# Patient Record
Sex: Male | Born: 1950
Health system: Southern US, Community
[De-identification: ages and names within clinical notes are randomized; demographics above are authoritative.]

## PROBLEM LIST (undated history)

## (undated) DIAGNOSIS — H57059 Tonic pupil, unspecified eye: Secondary | ICD-10-CM

## (undated) DIAGNOSIS — I10 Essential (primary) hypertension: Secondary | ICD-10-CM

## (undated) DIAGNOSIS — G4730 Sleep apnea, unspecified: Secondary | ICD-10-CM

## (undated) DIAGNOSIS — I6521 Occlusion and stenosis of right carotid artery: Secondary | ICD-10-CM

## (undated) DIAGNOSIS — E039 Hypothyroidism, unspecified: Secondary | ICD-10-CM

## (undated) DIAGNOSIS — I639 Cerebral infarction, unspecified: Secondary | ICD-10-CM

## (undated) DIAGNOSIS — E7800 Pure hypercholesterolemia, unspecified: Secondary | ICD-10-CM

## (undated) DIAGNOSIS — R7303 Prediabetes: Secondary | ICD-10-CM

## (undated) DIAGNOSIS — I2510 Atherosclerotic heart disease of native coronary artery without angina pectoris: Secondary | ICD-10-CM

## (undated) DIAGNOSIS — Z951 Presence of aortocoronary bypass graft: Secondary | ICD-10-CM

## (undated) DIAGNOSIS — E079 Disorder of thyroid, unspecified: Secondary | ICD-10-CM

## (undated) DIAGNOSIS — Z9221 Personal history of antineoplastic chemotherapy: Secondary | ICD-10-CM

## (undated) DIAGNOSIS — C8190 Hodgkin lymphoma, unspecified, unspecified site: Secondary | ICD-10-CM

## (undated) DIAGNOSIS — R0602 Shortness of breath: Secondary | ICD-10-CM

## (undated) DIAGNOSIS — I050 Rheumatic mitral stenosis: Secondary | ICD-10-CM

## (undated) DIAGNOSIS — D649 Anemia, unspecified: Secondary | ICD-10-CM

## (undated) DIAGNOSIS — I6522 Occlusion and stenosis of left carotid artery: Secondary | ICD-10-CM

## (undated) DIAGNOSIS — G2581 Restless legs syndrome: Secondary | ICD-10-CM

## (undated) DIAGNOSIS — F329 Major depressive disorder, single episode, unspecified: Secondary | ICD-10-CM

## (undated) DIAGNOSIS — C50919 Malignant neoplasm of unspecified site of unspecified female breast: Principal | ICD-10-CM

## (undated) HISTORY — DX: Pure hypercholesterolemia, unspecified: E78.00

## (undated) HISTORY — DX: Cerebral infarction, unspecified: I63.9

## (undated) HISTORY — DX: Tonic pupil, unspecified eye: H57.059

## (undated) HISTORY — DX: Rheumatic mitral stenosis: I05.0

## (undated) HISTORY — DX: Essential (primary) hypertension: I10

## (undated) HISTORY — DX: Malignant neoplasm of unspecified site of unspecified female breast: C50.919

## (undated) HISTORY — DX: Occlusion and stenosis of right carotid artery: I65.21

## (undated) HISTORY — DX: Occlusion and stenosis of left carotid artery: I65.22

## (undated) HISTORY — PX: TRANSTHORACIC ECHOCARDIOGRAM: SHX275

## (undated) HISTORY — PX: MASTECTOMY: SHX3

## (undated) HISTORY — DX: Disorder of thyroid, unspecified: E07.9

## (undated) HISTORY — PX: BREAST BIOPSY: SHX20

## (undated) HISTORY — DX: Anemia, unspecified: D64.9

---

## 1972-10-09 HISTORY — PX: TONSILLECTOMY: SUR1361

## 1981-10-09 DIAGNOSIS — F32A Depression, unspecified: Secondary | ICD-10-CM

## 1981-10-09 DIAGNOSIS — C8190 Hodgkin lymphoma, unspecified, unspecified site: Secondary | ICD-10-CM

## 1981-10-09 HISTORY — DX: Depression, unspecified: F32.A

## 1981-10-09 HISTORY — DX: Hodgkin lymphoma, unspecified, unspecified site: C81.90

## 1981-10-09 HISTORY — PX: LYMPH NODE BIOPSY: SHX201

## 2002-03-20 ENCOUNTER — Ambulatory Visit (HOSPITAL_COMMUNITY): Admission: RE | Admit: 2002-03-20 | Discharge: 2002-03-20 | Payer: Self-pay | Admitting: Neurology

## 2002-03-20 ENCOUNTER — Encounter: Payer: Self-pay | Admitting: Neurology

## 2003-03-16 ENCOUNTER — Ambulatory Visit (HOSPITAL_COMMUNITY): Admission: RE | Admit: 2003-03-16 | Discharge: 2003-03-16 | Payer: Self-pay | Admitting: Family Medicine

## 2003-03-16 ENCOUNTER — Encounter: Payer: Self-pay | Admitting: Family Medicine

## 2006-02-17 ENCOUNTER — Emergency Department (HOSPITAL_COMMUNITY): Admission: EM | Admit: 2006-02-17 | Discharge: 2006-02-17 | Payer: Self-pay | Admitting: Emergency Medicine

## 2006-02-20 ENCOUNTER — Ambulatory Visit (HOSPITAL_COMMUNITY): Admission: RE | Admit: 2006-02-20 | Discharge: 2006-02-20 | Payer: Self-pay | Admitting: Internal Medicine

## 2006-10-10 ENCOUNTER — Ambulatory Visit: Payer: Self-pay | Admitting: Cardiology

## 2007-07-23 ENCOUNTER — Ambulatory Visit (HOSPITAL_COMMUNITY): Admission: RE | Admit: 2007-07-23 | Discharge: 2007-07-23 | Payer: Self-pay | Admitting: Family Medicine

## 2008-08-12 ENCOUNTER — Ambulatory Visit (HOSPITAL_COMMUNITY): Admission: RE | Admit: 2008-08-12 | Discharge: 2008-08-12 | Payer: Self-pay | Admitting: Family Medicine

## 2010-10-09 HISTORY — PX: BREAST BIOPSY: SHX20

## 2010-10-09 HISTORY — PX: MASTECTOMY: SHX3

## 2010-12-08 ENCOUNTER — Other Ambulatory Visit (HOSPITAL_COMMUNITY): Payer: Self-pay | Admitting: Family Medicine

## 2010-12-08 DIAGNOSIS — N6310 Unspecified lump in the right breast, unspecified quadrant: Secondary | ICD-10-CM

## 2010-12-14 ENCOUNTER — Other Ambulatory Visit (HOSPITAL_COMMUNITY): Payer: Self-pay | Admitting: Family Medicine

## 2010-12-14 ENCOUNTER — Other Ambulatory Visit: Payer: Self-pay | Admitting: Diagnostic Radiology

## 2010-12-14 ENCOUNTER — Ambulatory Visit (HOSPITAL_COMMUNITY)
Admission: RE | Admit: 2010-12-14 | Discharge: 2010-12-14 | Disposition: A | Discharge status: Home / Self Care | Payer: BC Managed Care – PPO | Source: Ambulatory Visit | Attending: Family Medicine | Admitting: Family Medicine

## 2010-12-14 ENCOUNTER — Other Ambulatory Visit: Payer: Self-pay | Admitting: *Deleted

## 2010-12-14 DIAGNOSIS — N6310 Unspecified lump in the right breast, unspecified quadrant: Secondary | ICD-10-CM

## 2010-12-14 DIAGNOSIS — C50929 Malignant neoplasm of unspecified site of unspecified male breast: Secondary | ICD-10-CM | POA: Insufficient documentation

## 2010-12-16 ENCOUNTER — Other Ambulatory Visit (HOSPITAL_COMMUNITY): Payer: Self-pay | Admitting: Internal Medicine

## 2010-12-16 DIAGNOSIS — C50919 Malignant neoplasm of unspecified site of unspecified female breast: Secondary | ICD-10-CM

## 2010-12-21 ENCOUNTER — Ambulatory Visit (HOSPITAL_COMMUNITY)
Admission: RE | Admit: 2010-12-21 | Discharge: 2010-12-21 | Disposition: A | Discharge status: Home / Self Care | Payer: BC Managed Care – PPO | Source: Ambulatory Visit | Attending: Internal Medicine | Admitting: Internal Medicine

## 2010-12-21 DIAGNOSIS — C50929 Malignant neoplasm of unspecified site of unspecified male breast: Secondary | ICD-10-CM | POA: Insufficient documentation

## 2010-12-22 ENCOUNTER — Ambulatory Visit (HOSPITAL_COMMUNITY): Admission: RE | Admit: 2010-12-22 | Payer: BC Managed Care – PPO | Source: Ambulatory Visit

## 2010-12-28 ENCOUNTER — Ambulatory Visit (HOSPITAL_COMMUNITY)
Admission: RE | Admit: 2010-12-28 | Discharge: 2010-12-28 | Disposition: A | Discharge status: Home / Self Care | Payer: BC Managed Care – PPO | Source: Ambulatory Visit | Attending: Internal Medicine | Admitting: Internal Medicine

## 2010-12-28 ENCOUNTER — Other Ambulatory Visit (HOSPITAL_COMMUNITY): Payer: Self-pay | Admitting: General Surgery

## 2010-12-28 DIAGNOSIS — C50919 Malignant neoplasm of unspecified site of unspecified female breast: Secondary | ICD-10-CM

## 2010-12-29 ENCOUNTER — Other Ambulatory Visit (HOSPITAL_COMMUNITY): Payer: Self-pay | Admitting: General Surgery

## 2010-12-29 DIAGNOSIS — D0510 Intraductal carcinoma in situ of unspecified breast: Secondary | ICD-10-CM

## 2011-01-02 ENCOUNTER — Encounter (HOSPITAL_COMMUNITY)
Admission: RE | Admit: 2011-01-02 | Discharge: 2011-01-02 | Disposition: A | Discharge status: Home / Self Care | Payer: BC Managed Care – PPO | Source: Ambulatory Visit | Attending: General Surgery | Admitting: General Surgery

## 2011-01-02 ENCOUNTER — Encounter (HOSPITAL_COMMUNITY): Payer: Self-pay

## 2011-01-02 ENCOUNTER — Other Ambulatory Visit (HOSPITAL_COMMUNITY): Payer: BC Managed Care – PPO

## 2011-01-02 ENCOUNTER — Ambulatory Visit (HOSPITAL_COMMUNITY)
Admission: RE | Admit: 2011-01-02 | Discharge: 2011-01-02 | Disposition: A | Discharge status: Home / Self Care | Payer: BC Managed Care – PPO | Source: Ambulatory Visit | Attending: General Surgery | Admitting: General Surgery

## 2011-01-02 DIAGNOSIS — J984 Other disorders of lung: Secondary | ICD-10-CM | POA: Insufficient documentation

## 2011-01-02 DIAGNOSIS — C50929 Malignant neoplasm of unspecified site of unspecified male breast: Secondary | ICD-10-CM | POA: Insufficient documentation

## 2011-01-02 DIAGNOSIS — C50919 Malignant neoplasm of unspecified site of unspecified female breast: Secondary | ICD-10-CM

## 2011-01-02 DIAGNOSIS — D0510 Intraductal carcinoma in situ of unspecified breast: Secondary | ICD-10-CM

## 2011-01-02 DIAGNOSIS — R937 Abnormal findings on diagnostic imaging of other parts of musculoskeletal system: Secondary | ICD-10-CM | POA: Insufficient documentation

## 2011-01-02 MED ORDER — IOHEXOL 300 MG/ML  SOLN
100.0000 mL | Freq: Once | INTRAMUSCULAR | Status: AC | PRN
  Administered 2011-01-02: 100 mL via INTRAVENOUS

## 2011-01-02 MED ORDER — TECHNETIUM TC 99M MEDRONATE IV KIT
25.0000 | PACK | Freq: Once | INTRAVENOUS | Status: AC | PRN
Start: 2011-01-02 — End: 2011-01-02
  Administered 2011-01-02: 26.6 via INTRAVENOUS

## 2011-01-09 ENCOUNTER — Other Ambulatory Visit (HOSPITAL_COMMUNITY): Payer: Self-pay | Admitting: Oncology

## 2011-01-09 ENCOUNTER — Encounter (HOSPITAL_COMMUNITY): Payer: BC Managed Care – PPO

## 2011-01-09 ENCOUNTER — Encounter (HOSPITAL_COMMUNITY): Payer: BC Managed Care – PPO | Attending: Internal Medicine | Admitting: Oncology

## 2011-01-09 DIAGNOSIS — C50929 Malignant neoplasm of unspecified site of unspecified male breast: Secondary | ICD-10-CM | POA: Insufficient documentation

## 2011-01-09 DIAGNOSIS — Z79899 Other long term (current) drug therapy: Secondary | ICD-10-CM | POA: Insufficient documentation

## 2011-01-09 DIAGNOSIS — C50919 Malignant neoplasm of unspecified site of unspecified female breast: Secondary | ICD-10-CM

## 2011-01-09 LAB — COMPREHENSIVE METABOLIC PANEL
Alkaline Phosphatase: 63 U/L (ref 39–117)
BUN: 14 mg/dL (ref 6–23)
CO2: 26 mEq/L (ref 19–32)
GFR calc non Af Amer: 59 mL/min — ABNORMAL LOW (ref >60)
Glucose, Bld: 61 mg/dL — ABNORMAL LOW (ref 70–99)
Potassium: 3.6 mEq/L (ref 3.5–5.1)
Total Bilirubin: 0.5 mg/dL (ref 0.3–1.2)
Total Protein: 7.1 g/dL (ref 6.0–8.3)

## 2011-01-09 LAB — SEDIMENTATION RATE: Sed Rate: 1 mm/hr (ref 0–16)

## 2011-01-09 LAB — CBC
HCT: 46.8 % (ref 39.0–52.0)
MCV: 92.9 fL (ref 78.0–100.0)
RDW: 13.1 % (ref 11.5–15.5)
WBC: 13.4 10*3/uL — ABNORMAL HIGH (ref 4.0–10.5)

## 2011-01-09 LAB — DIFFERENTIAL
Eosinophils Relative: 2 % (ref 0–5)
Lymphocytes Relative: 24 % (ref 12–46)
Lymphs Abs: 3.2 10*3/uL (ref 0.7–4.0)
Monocytes Absolute: 1.3 10*3/uL — ABNORMAL HIGH (ref 0.1–1.0)

## 2011-01-09 LAB — LACTATE DEHYDROGENASE: LDH: 166 U/L (ref 94–250)

## 2011-01-10 ENCOUNTER — Other Ambulatory Visit (HOSPITAL_COMMUNITY): Payer: Self-pay | Admitting: General Surgery

## 2011-01-10 ENCOUNTER — Other Ambulatory Visit (INDEPENDENT_AMBULATORY_CARE_PROVIDER_SITE_OTHER): Payer: Self-pay | Admitting: Internal Medicine

## 2011-01-10 DIAGNOSIS — C50911 Malignant neoplasm of unspecified site of right female breast: Secondary | ICD-10-CM

## 2011-01-12 ENCOUNTER — Other Ambulatory Visit (HOSPITAL_COMMUNITY): Payer: BC Managed Care – PPO

## 2011-01-17 ENCOUNTER — Encounter (HOSPITAL_COMMUNITY): Payer: BC Managed Care – PPO

## 2011-01-17 ENCOUNTER — Ambulatory Visit (HOSPITAL_COMMUNITY)
Admission: RE | Admit: 2011-01-17 | Discharge: 2011-01-18 | Disposition: A | Discharge status: Home / Self Care | Payer: BC Managed Care – PPO | Source: Ambulatory Visit | Attending: General Surgery | Admitting: General Surgery

## 2011-01-17 ENCOUNTER — Encounter (HOSPITAL_COMMUNITY): Payer: Self-pay

## 2011-01-17 ENCOUNTER — Other Ambulatory Visit: Payer: Self-pay | Admitting: General Surgery

## 2011-01-17 DIAGNOSIS — Z923 Personal history of irradiation: Secondary | ICD-10-CM | POA: Insufficient documentation

## 2011-01-17 DIAGNOSIS — C50911 Malignant neoplasm of unspecified site of right female breast: Secondary | ICD-10-CM

## 2011-01-17 DIAGNOSIS — C50929 Malignant neoplasm of unspecified site of unspecified male breast: Secondary | ICD-10-CM | POA: Insufficient documentation

## 2011-01-17 DIAGNOSIS — Z8571 Personal history of Hodgkin lymphoma: Secondary | ICD-10-CM | POA: Insufficient documentation

## 2011-01-17 LAB — SURGICAL PCR SCREEN: Staphylococcus aureus: NEGATIVE

## 2011-01-17 MED ORDER — TECHNETIUM TC 99M SULFUR COLLOID FILTERED
1.0000 | Freq: Once | INTRAVENOUS | Status: AC | PRN
  Administered 2011-01-17: 1 via INTRADERMAL

## 2011-01-18 LAB — CBC
HCT: 42.1 % (ref 39.0–52.0)
Hemoglobin: 13.8 g/dL (ref 13.0–17.0)
MCH: 30.5 pg (ref 26.0–34.0)
MCHC: 32.8 g/dL (ref 30.0–36.0)
MCV: 93.1 fL (ref 78.0–100.0)
RDW: 12.9 % (ref 11.5–15.5)

## 2011-01-18 LAB — DIFFERENTIAL
Eosinophils Relative: 2 % (ref 0–5)
Lymphocytes Relative: 23 % (ref 12–46)
Lymphs Abs: 2.2 10*3/uL (ref 0.7–4.0)
Monocytes Absolute: 0.9 10*3/uL (ref 0.1–1.0)
Monocytes Relative: 10 % (ref 3–12)

## 2011-01-18 LAB — COMPREHENSIVE METABOLIC PANEL
BUN: 11 mg/dL (ref 6–23)
CO2: 25 mEq/L (ref 19–32)
Calcium: 8.4 mg/dL (ref 8.4–10.5)
GFR calc non Af Amer: >60 mL/min (ref >60)
Glucose, Bld: 112 mg/dL — ABNORMAL HIGH (ref 70–99)
Total Protein: 5.8 g/dL — ABNORMAL LOW (ref 6.0–8.3)

## 2011-01-26 ENCOUNTER — Encounter (HOSPITAL_COMMUNITY): Payer: BC Managed Care – PPO

## 2011-02-01 NOTE — Discharge Summary (Signed)
  NAME:  Ryan [name], Ryan [name] NO.:  [id number]  MEDICAL RECORD NO.:  [id number]           PATIENT TYPE:  O  LOCATION:  A305                          FACILITY:  [facility]  PHYSICIAN:  Barbaraann Barthel, M.D. DATE OF BIRTH:  06/01/1951  DATE OF ADMISSION:  01/17/2011 DATE OF DISCHARGE:  04/11/2012LH                              DISCHARGE SUMMARY   DIAGNOSIS:  Adenocarcinoma of the right breast.  PROCEDURE:  Modified radical mastectomy (with sentinel node biopsy).  SECONDARY DIAGNOSIS:  History Hodgkin disease in 73.  Note, this 60 year old white male had biopsy-proven carcinoma of the breast.  We coordinated care with him with the Pathology Department, the Radiology Department and the Oncology Department and planned to bring him in the hospital via the outpatient department for a mastectomy and lymph node dissection.  This was carried out without incident.  His hospital course was completely benign.  His wound was clean.  He had minimal discomfort postoperatively.  His right scapula was flat.  He had no seroma, however, the Jackson-Pratt drain put out approximately 100 mL since surgery, and we will plan to remove this drain either just prior to his discharge here or in the outpatient setting.  He is told to continue the regular medications with the exception of his Aggrenox, and he has been printed out his medication list for that, and otherwise, he has been told to keep his wound clean and dry.  He is to do no heavy lifting and to have no blood work taken out of his right arm.  No driving, no sexual activity and to increase his activity as tolerated. He is to keep his wound clean and dry.  He is to have a regular diet and he has been given instructions as to wound care.  He does not require anything more than Tylenol for pain and he has been told to take 2 tablets Extra Strength 500 mg tablets every 4-6 hours as needed.  The rest of his medications, his Xanax, his  Celexa, and his simvastatin, he is to take as before and he is told to hold off on his Aggrenox for 5 days.  We will follow up with him perioperatively, after which he is to return to his medical doctors, Dr. Sherwood Gambler and Dr. Mariel Sleet, for further treatment.     Barbaraann Barthel, M.D.     WB/MEDQ  D:  01/18/2011  T:  01/19/2011  Job:  [id number]  cc:   Ladona Horns. Mariel Sleet, MD Fax: [phone]  Madelin Rear. Sherwood Gambler, MD Fax: [phone]  Electronically Signed by Barbaraann Barthel M.D. on 02/01/2011 03:28:07 PM

## 2011-02-01 NOTE — Op Note (Signed)
NAME:  REGNALD, BOWENS NO.:  [id number]  MEDICAL RECORD NO.:  [id number]           PATIENT TYPE:  O  LOCATION:  A305                          FACILITY:  [facility]  PHYSICIAN:  Barbaraann Barthel, M.D. DATE OF BIRTH:  [birth year]  DATE OF PROCEDURE:  01/17/2011 DATE OF DISCHARGE:                              OPERATIVE REPORT   SURGEON:  Barbaraann Barthel, MD  PREOPERATIVE DIAGNOSIS:  Invasive adenocarcinoma of the right breast.  POSTOPERATIVE DIAGNOSIS:  Invasive adenocarcinoma of the right breast.  PROCEDURE:  Right modified radical with sentinel node biopsy.  SPECIMEN:  Right axillary tissue and right breast.  WOUND CLASSIFICATION:  Clean.  NOTE:  This is a 60 year old white male who had no previous history of carcinoma of the breast.  However, in 1983, he received radiation treatments above the diaphragm for Hodgkin disease.  He had a palpable lump noted in his breast that had been present for he thinks at least a year.  He had a needle biopsy done which showed invasive carcinoma of the breast and core biopsy done in March.  He was referred to me. Surgery was cleared by the Medical Service preoperatively and we discussed taking him to surgery.  We discussed the various surgical procedures.  I discussed with him that we would take more axillary tissue from his axilla and do a sentinel biopsy as well as removing his entire right breast.  We had discussed this in detail with him as well as presenting him in surgical conference preoperatively and discussing this with Dr. Mariel Sleet.  All questions were answered.  We discussed complications not limited to, but including bleeding, infection and the possibility that he may have some numbness of the inner aspect of his right arm and informed consent was obtained.  TECHNIQUE:  The patient was placed in supine position and after the adequate administration of general anesthesia via endotracheal intubation, his right  hemithorax was prepped with Betadine solution and draped in usual manner.  We did use a right limb isolator so that we could maneuver his right arm around.  The patient had received approximately an hour and half previous to this injection of technetium radioactive, technetium colloid for sentinel node biopsy and we coordinated this with Dr. Tyron Russell preoperatively.  We then injected around the tumor site with approximately 4 mL of blue dye and this was massaged for 5 minutes prior to surgical incision.  We discussed the pathology procedure preoperatively with Dr. Luisa Hart and we then removed the breast and removing this medially as far as the sternum and removing the breast tissue superiorly almost to the clavicle and inferiorly down towards the rectus muscle and removing the pectoralis major fascia with the specimen.  This was removed from medial to lateral and we removed this and then we used the gamma probe in order to find an area that was high radioactivity.  When we removed this and this had a high radioactivity both in vivo as well as in vitro.  The nodes were very small and I had difficulty really seeing these.  They were only slightly painted blue. This was obviously because the  patient had received extensive radiotherapy in the past.  I then removed more axillary tissue and sent this as a separate specimen for permanent section as we had discussed previously with Dr. Mariel Sleet and then I checked to see if there was any palpable lymph nodes that I could palpate and there was one area where I palpated the lymph node.  This was not very high on the [place] counter with the gamma probe, but, however, I removed this as a palpable lymph node and sent this as a separate specimen as well.  After we were careful to not damage the thoracodorsal long thoracic nerve and we then sent the breast with its pectoralis major fascia as a permanent section. We then changed gloves, irrigated with sterile  water and I elected to leave a Jackson-Pratt drain which I placed through a separate stab wound incision into the right axilla and then closed the skin with a stapling device.  The drain was sutured in place with 3-0 nylon.  Prior to closure; all sponge, needle and instrument counts were found to be correct.  Estimated blood loss was less than 45 mL and the patient received 150 mL of crystalloids intraoperatively.  There were no complications.     Barbaraann Barthel, M.D.     WB/MEDQ  D:  01/17/2011  T:  01/18/2011  Job:  [id number]  cc:   Madelin Rear. Sherwood Gambler, MD Fax: [phone]  Ladona Horns. Mariel Sleet, MD Fax: [phone]  Roderic Scarce, MD Fax: [phone]  Dr. Luisa Hart  Electronically Signed by Barbaraann Barthel M.D. on 02/01/2011 96:29:52 PM

## 2011-02-06 ENCOUNTER — Ambulatory Visit (HOSPITAL_COMMUNITY): Payer: BC Managed Care – PPO | Admitting: Oncology

## 2011-02-08 ENCOUNTER — Ambulatory Visit (HOSPITAL_COMMUNITY): Payer: BC Managed Care – PPO | Admitting: Oncology

## 2011-02-20 ENCOUNTER — Other Ambulatory Visit (HOSPITAL_COMMUNITY): Payer: Self-pay | Admitting: Oncology

## 2011-02-20 ENCOUNTER — Encounter (HOSPITAL_COMMUNITY): Payer: BC Managed Care – PPO | Attending: Internal Medicine | Admitting: Oncology

## 2011-02-20 DIAGNOSIS — I6529 Occlusion and stenosis of unspecified carotid artery: Secondary | ICD-10-CM

## 2011-02-20 DIAGNOSIS — C50929 Malignant neoplasm of unspecified site of unspecified male breast: Secondary | ICD-10-CM | POA: Insufficient documentation

## 2011-02-20 DIAGNOSIS — Z79899 Other long term (current) drug therapy: Secondary | ICD-10-CM | POA: Insufficient documentation

## 2011-02-20 LAB — PROTIME-INR
INR: 0.92 (ref 0.00–1.49)
Prothrombin Time: 12.6 seconds (ref 11.6–15.2)

## 2011-02-22 ENCOUNTER — Ambulatory Visit (HOSPITAL_COMMUNITY)
Admission: RE | Admit: 2011-02-22 | Discharge: 2011-02-22 | Disposition: A | Discharge status: Home / Self Care | Payer: BC Managed Care – PPO | Source: Ambulatory Visit | Attending: Oncology | Admitting: Oncology

## 2011-02-22 DIAGNOSIS — I658 Occlusion and stenosis of other precerebral arteries: Secondary | ICD-10-CM | POA: Insufficient documentation

## 2011-02-22 DIAGNOSIS — I6529 Occlusion and stenosis of unspecified carotid artery: Secondary | ICD-10-CM | POA: Insufficient documentation

## 2011-02-22 DIAGNOSIS — Z853 Personal history of malignant neoplasm of breast: Secondary | ICD-10-CM | POA: Insufficient documentation

## 2011-02-24 ENCOUNTER — Encounter (HOSPITAL_COMMUNITY): Payer: BC Managed Care – PPO

## 2011-02-24 ENCOUNTER — Other Ambulatory Visit (HOSPITAL_COMMUNITY): Payer: Self-pay | Admitting: Oncology

## 2011-02-24 DIAGNOSIS — C50929 Malignant neoplasm of unspecified site of unspecified male breast: Secondary | ICD-10-CM

## 2011-02-24 LAB — PROTIME-INR: INR: 1.53 — ABNORMAL HIGH (ref 0.00–1.49)

## 2011-02-24 NOTE — Procedures (Signed)
NAME:  Ryan [name], Ryan [name] NO.:  [id number]   MEDICAL RECORD NO.:  [id number]          PATIENT TYPE:  OUT   LOCATION:  RAD                           FACILITY:  [facility]   PHYSICIAN:  Dani Gobble, MD       DATE OF BIRTH:  06/10/1951   DATE OF PROCEDURE:  02/20/2006  DATE OF DISCHARGE:                                  ECHOCARDIOGRAM   REFERRING:  Dr. Sherwood Gambler.   INDICATIONS:  Mr. Bordonaro is a 60 year old gentleman with past medical  history of hypertension with TIAs.   Technical quality of the study is adequate.   The aorta measures at the upper limits of normal at 4.0 cm.   The left atrium measures mildly dilated at 4.2 cm.  The patient appeared to  be in sinus rhythm.  No obvious clots or masses were appreciated.   The intraventricular septum and posterior wall are mildly thickened.   The aortic valve is trileaflet with mild thickening and calcification but  without limitation to leaflet excursion.  Doppler interrogation aortic valve  reveals a peak velocity of 1.6 meters per second corresponding to a peak  gradient 10 mmHg and a mean gradient of 6 mmHg.  No aortic insufficiency is  appreciated.   The mitral valve appears mildly thickened particularly on the anterior  leaflet with mild calcification but without limitation to leaflet excursion.  Mild mitral regurgitation is noted.  No mitral valve prolapse is noted.   Doppler interrogation of mitral valve is within normal limits.   The pulmonic valve appears grossly structurally normal.   Tricuspid valve appears grossly structurally normal with mild tricuspid  regurgitation noted.   The left ventricle measures normal in size with the LV/IDD measured at 4.0  cm.  LVIC measured at 2.7 cm.  Overall left ventricular ejection fraction is  normal.  In the short axis view, the lateral wall appears to be hypokinetic.  I cannot exclude subtle hypokinesis of the inferior posterior walls.  Again  overall ejection fraction  is normal.   The right atrium and right ventricle appear normal in size and right  ventricular systolic function appears normal.   IMPRESSION:  1.  Mild left aorta at the aortic root measured at the upper limits of      normal.  2.  Mild left atrial enlargement.  3.  Mild concentric left ventricular hypertrophy.  4.  Mild aortic sclerosis without stenosis.  5.  Mild mitral and tricuspid regurgitation.  6.  Mild thickening and calcification of the anterior leaflet and the mitral      valve without limitation leaflet excursion.  7.  Normal left ventricular size and overall ejection fraction with a      suggestion of lateral wall hypokinesis appreciated in the short axis      view but not confirmed in the apical four-chamber view.  I cannot      exclude the possibility of subtle hypokinesis of the inferior and      posterior walls as well.  Again overall ejection fraction is normal.  ______________________________  Dani Gobble, MD     AB/MEDQ  D:  02/20/2006  T:  02/21/2006  Job:  [id number]   cc:   Madelin Rear. Sherwood Gambler, MD  Fax: [phone]

## 2011-02-27 ENCOUNTER — Other Ambulatory Visit (HOSPITAL_COMMUNITY): Payer: Self-pay | Admitting: Oncology

## 2011-02-27 ENCOUNTER — Encounter (HOSPITAL_COMMUNITY): Payer: BC Managed Care – PPO

## 2011-02-27 DIAGNOSIS — Z86718 Personal history of other venous thrombosis and embolism: Secondary | ICD-10-CM

## 2011-02-27 DIAGNOSIS — C50929 Malignant neoplasm of unspecified site of unspecified male breast: Secondary | ICD-10-CM

## 2011-02-27 LAB — PROTIME-INR: Prothrombin Time: 23.7 seconds — ABNORMAL HIGH (ref 11.6–15.2)

## 2011-03-02 ENCOUNTER — Encounter (HOSPITAL_COMMUNITY): Payer: BC Managed Care – PPO

## 2011-03-02 ENCOUNTER — Other Ambulatory Visit (HOSPITAL_COMMUNITY): Payer: Self-pay | Admitting: Oncology

## 2011-03-02 DIAGNOSIS — C50929 Malignant neoplasm of unspecified site of unspecified male breast: Secondary | ICD-10-CM

## 2011-03-02 DIAGNOSIS — Z86718 Personal history of other venous thrombosis and embolism: Secondary | ICD-10-CM

## 2011-03-02 LAB — PROTIME-INR: Prothrombin Time: 26.5 seconds — ABNORMAL HIGH (ref 11.6–15.2)

## 2011-03-07 ENCOUNTER — Other Ambulatory Visit (HOSPITAL_COMMUNITY): Payer: Self-pay | Admitting: Oncology

## 2011-03-07 ENCOUNTER — Encounter (HOSPITAL_COMMUNITY): Payer: BC Managed Care – PPO

## 2011-03-07 DIAGNOSIS — I82409 Acute embolism and thrombosis of unspecified deep veins of unspecified lower extremity: Secondary | ICD-10-CM

## 2011-03-07 DIAGNOSIS — C50929 Malignant neoplasm of unspecified site of unspecified male breast: Secondary | ICD-10-CM

## 2011-03-07 LAB — PROTIME-INR
INR: 2.41 — ABNORMAL HIGH (ref 0.00–1.49)
Prothrombin Time: 26.4 seconds — ABNORMAL HIGH (ref 11.6–15.2)

## 2011-03-08 ENCOUNTER — Encounter (HOSPITAL_COMMUNITY): Payer: BC Managed Care – PPO | Admitting: Oncology

## 2011-03-08 DIAGNOSIS — C50929 Malignant neoplasm of unspecified site of unspecified male breast: Secondary | ICD-10-CM

## 2011-03-15 ENCOUNTER — Other Ambulatory Visit (HOSPITAL_COMMUNITY): Payer: Self-pay | Admitting: Oncology

## 2011-03-15 ENCOUNTER — Encounter (HOSPITAL_COMMUNITY): Payer: BC Managed Care – PPO | Attending: Internal Medicine

## 2011-03-15 DIAGNOSIS — C50929 Malignant neoplasm of unspecified site of unspecified male breast: Secondary | ICD-10-CM | POA: Insufficient documentation

## 2011-03-15 DIAGNOSIS — I6529 Occlusion and stenosis of unspecified carotid artery: Secondary | ICD-10-CM

## 2011-03-15 DIAGNOSIS — Z79899 Other long term (current) drug therapy: Secondary | ICD-10-CM | POA: Insufficient documentation

## 2011-03-15 LAB — PROTIME-INR: Prothrombin Time: 29.4 seconds — ABNORMAL HIGH (ref 11.6–15.2)

## 2011-03-22 ENCOUNTER — Other Ambulatory Visit: Payer: Self-pay | Admitting: Family Medicine

## 2011-03-22 ENCOUNTER — Other Ambulatory Visit (HOSPITAL_COMMUNITY): Payer: Self-pay | Admitting: Oncology

## 2011-03-22 ENCOUNTER — Encounter (HOSPITAL_COMMUNITY): Payer: BC Managed Care – PPO

## 2011-03-22 DIAGNOSIS — I6529 Occlusion and stenosis of unspecified carotid artery: Secondary | ICD-10-CM

## 2011-03-22 DIAGNOSIS — C50929 Malignant neoplasm of unspecified site of unspecified male breast: Secondary | ICD-10-CM

## 2011-03-22 LAB — PROTIME-INR: INR: 2.94 — ABNORMAL HIGH (ref 0.00–1.49)

## 2011-03-23 LAB — LIPID PANEL
LDL Cholesterol: 112 mg/dL — ABNORMAL HIGH (ref 0–99)
Total CHOL/HDL Ratio: 5 RATIO
VLDL: 39 mg/dL (ref 0–40)

## 2011-03-30 ENCOUNTER — Encounter (HOSPITAL_COMMUNITY): Payer: BC Managed Care – PPO

## 2011-03-30 ENCOUNTER — Other Ambulatory Visit (HOSPITAL_COMMUNITY): Payer: Self-pay | Admitting: Oncology

## 2011-03-30 DIAGNOSIS — I6529 Occlusion and stenosis of unspecified carotid artery: Secondary | ICD-10-CM

## 2011-03-30 DIAGNOSIS — C50929 Malignant neoplasm of unspecified site of unspecified male breast: Secondary | ICD-10-CM

## 2011-03-30 LAB — PROTIME-INR
INR: 2.57 — ABNORMAL HIGH (ref 0.00–1.49)
Prothrombin Time: 28 seconds — ABNORMAL HIGH (ref 11.6–15.2)

## 2011-04-13 ENCOUNTER — Other Ambulatory Visit (HOSPITAL_COMMUNITY): Payer: Self-pay | Admitting: Oncology

## 2011-04-13 ENCOUNTER — Encounter (HOSPITAL_COMMUNITY): Payer: BC Managed Care – PPO | Attending: Internal Medicine

## 2011-04-13 DIAGNOSIS — C50929 Malignant neoplasm of unspecified site of unspecified male breast: Secondary | ICD-10-CM

## 2011-04-13 DIAGNOSIS — I82409 Acute embolism and thrombosis of unspecified deep veins of unspecified lower extremity: Secondary | ICD-10-CM

## 2011-04-13 DIAGNOSIS — Z79899 Other long term (current) drug therapy: Secondary | ICD-10-CM | POA: Insufficient documentation

## 2011-04-13 DIAGNOSIS — I6529 Occlusion and stenosis of unspecified carotid artery: Secondary | ICD-10-CM | POA: Insufficient documentation

## 2011-04-13 LAB — PROTIME-INR
INR: 2.78 — ABNORMAL HIGH (ref 0.00–1.49)
Prothrombin Time: 29.8 seconds — ABNORMAL HIGH (ref 11.6–15.2)

## 2011-04-18 ENCOUNTER — Other Ambulatory Visit (HOSPITAL_COMMUNITY): Payer: BC Managed Care – PPO

## 2011-04-27 ENCOUNTER — Other Ambulatory Visit (HOSPITAL_COMMUNITY): Payer: Self-pay | Admitting: Oncology

## 2011-04-27 ENCOUNTER — Encounter (HOSPITAL_COMMUNITY): Payer: Self-pay | Admitting: Oncology

## 2011-04-27 ENCOUNTER — Encounter (HOSPITAL_COMMUNITY): Payer: BC Managed Care – PPO

## 2011-04-27 DIAGNOSIS — I6521 Occlusion and stenosis of right carotid artery: Secondary | ICD-10-CM

## 2011-04-27 DIAGNOSIS — I8290 Acute embolism and thrombosis of unspecified vein: Secondary | ICD-10-CM

## 2011-04-27 DIAGNOSIS — G459 Transient cerebral ischemic attack, unspecified: Secondary | ICD-10-CM

## 2011-04-27 DIAGNOSIS — C50919 Malignant neoplasm of unspecified site of unspecified female breast: Secondary | ICD-10-CM | POA: Insufficient documentation

## 2011-04-27 DIAGNOSIS — I6522 Occlusion and stenosis of left carotid artery: Secondary | ICD-10-CM

## 2011-04-27 DIAGNOSIS — C8190 Hodgkin lymphoma, unspecified, unspecified site: Secondary | ICD-10-CM | POA: Insufficient documentation

## 2011-04-27 HISTORY — DX: Occlusion and stenosis of right carotid artery: I65.21

## 2011-04-27 HISTORY — DX: Malignant neoplasm of unspecified site of unspecified female breast: C50.919

## 2011-04-27 HISTORY — DX: Occlusion and stenosis of left carotid artery: I65.22

## 2011-04-27 NOTE — Progress Notes (Deleted)
Ryan [name] presented for labwork. Labs per MD order drawn via { LAB ACCESS:115400402} {CHL ONC AP PORTACATH BLOOD ZOXWRU:[id number]} Procedure without incident.  {CHL ONC AP FLUSH BJYNWGN:[id number]} Patient tolerated procedure well.

## 2011-04-27 NOTE — Progress Notes (Signed)
VP x1 stick to the left [facility] for TSH/PT/INR. Patient tolerated well. Site WNL. Coumadin 5/4/4.

## 2011-04-28 ENCOUNTER — Telehealth (HOSPITAL_COMMUNITY): Payer: Self-pay

## 2011-04-28 NOTE — Telephone Encounter (Signed)
Ryan [name] notified that [name] should continue the same dosage of coumadin and return for PT level on 8/2 @ 3pm.

## 2011-05-11 ENCOUNTER — Encounter (HOSPITAL_COMMUNITY): Payer: BC Managed Care – PPO | Attending: Internal Medicine

## 2011-05-11 DIAGNOSIS — C50929 Malignant neoplasm of unspecified site of unspecified male breast: Secondary | ICD-10-CM | POA: Insufficient documentation

## 2011-05-11 DIAGNOSIS — R5383 Other fatigue: Secondary | ICD-10-CM | POA: Insufficient documentation

## 2011-05-11 DIAGNOSIS — I6529 Occlusion and stenosis of unspecified carotid artery: Secondary | ICD-10-CM | POA: Insufficient documentation

## 2011-05-11 DIAGNOSIS — G459 Transient cerebral ischemic attack, unspecified: Secondary | ICD-10-CM

## 2011-05-11 DIAGNOSIS — R5381 Other malaise: Secondary | ICD-10-CM | POA: Insufficient documentation

## 2011-05-12 ENCOUNTER — Other Ambulatory Visit (HOSPITAL_COMMUNITY): Payer: Self-pay | Admitting: Oncology

## 2011-05-12 DIAGNOSIS — I6522 Occlusion and stenosis of left carotid artery: Secondary | ICD-10-CM

## 2011-05-12 DIAGNOSIS — I6521 Occlusion and stenosis of right carotid artery: Secondary | ICD-10-CM

## 2011-06-02 ENCOUNTER — Other Ambulatory Visit (HOSPITAL_COMMUNITY): Payer: BC Managed Care – PPO

## 2011-06-07 ENCOUNTER — Encounter (HOSPITAL_BASED_OUTPATIENT_CLINIC_OR_DEPARTMENT_OTHER): Payer: BC Managed Care – PPO

## 2011-06-07 DIAGNOSIS — I6529 Occlusion and stenosis of unspecified carotid artery: Secondary | ICD-10-CM

## 2011-06-07 DIAGNOSIS — I6521 Occlusion and stenosis of right carotid artery: Secondary | ICD-10-CM

## 2011-06-07 DIAGNOSIS — Z7901 Long term (current) use of anticoagulants: Secondary | ICD-10-CM

## 2011-06-07 DIAGNOSIS — I6522 Occlusion and stenosis of left carotid artery: Secondary | ICD-10-CM

## 2011-06-07 LAB — PROTIME-INR: INR: 3.01 — ABNORMAL HIGH (ref 0.00–1.49)

## 2011-06-07 NOTE — Progress Notes (Signed)
Ryan [name] presented for labwork. Labs per MD order drawn via peripheral stick lt ac. Lab for pt/inr. Reports taking Coumadin 5/4/4/5. No missed doses.  Procedure without incident.   Patient tolerated procedure well.

## 2011-06-08 ENCOUNTER — Telehealth (HOSPITAL_COMMUNITY): Payer: Self-pay | Admitting: *Deleted

## 2011-06-08 ENCOUNTER — Other Ambulatory Visit (HOSPITAL_COMMUNITY): Payer: Self-pay | Admitting: Oncology

## 2011-06-08 DIAGNOSIS — I6521 Occlusion and stenosis of right carotid artery: Secondary | ICD-10-CM

## 2011-06-08 DIAGNOSIS — I6522 Occlusion and stenosis of left carotid artery: Secondary | ICD-10-CM

## 2011-06-08 NOTE — Telephone Encounter (Signed)
Message copied by Dennie Maizes on Thu Jun 08, 2011  1:45 PM ------      Message from: Ellouise Newer III      Created: Thu Jun 08, 2011 10:27 AM       Same coumadin dose            PT/INR in 10 days.

## 2011-06-08 NOTE — Telephone Encounter (Signed)
Message left on answering machine to continue same dose Coumadin, recheck on 9/10 at 4pm.

## 2011-06-09 ENCOUNTER — Encounter (HOSPITAL_COMMUNITY): Payer: Self-pay | Admitting: Oncology

## 2011-06-09 ENCOUNTER — Encounter (HOSPITAL_BASED_OUTPATIENT_CLINIC_OR_DEPARTMENT_OTHER): Payer: BC Managed Care – PPO | Admitting: Oncology

## 2011-06-09 VITALS — BP 137/73 | HR 90 | Temp 98.0°F

## 2011-06-09 DIAGNOSIS — I6521 Occlusion and stenosis of right carotid artery: Secondary | ICD-10-CM

## 2011-06-09 DIAGNOSIS — C50919 Malignant neoplasm of unspecified site of unspecified female breast: Secondary | ICD-10-CM

## 2011-06-09 DIAGNOSIS — I6522 Occlusion and stenosis of left carotid artery: Secondary | ICD-10-CM

## 2011-06-09 DIAGNOSIS — Z8571 Personal history of Hodgkin lymphoma: Secondary | ICD-10-CM

## 2011-06-09 DIAGNOSIS — R5383 Other fatigue: Secondary | ICD-10-CM

## 2011-06-09 DIAGNOSIS — C50929 Malignant neoplasm of unspecified site of unspecified male breast: Secondary | ICD-10-CM

## 2011-06-09 DIAGNOSIS — R5381 Other malaise: Secondary | ICD-10-CM

## 2011-06-09 MED ORDER — WARFARIN SODIUM 2 MG PO TABS: 2.0000 mg | ORAL_TABLET | ORAL | Status: DC

## 2011-06-09 NOTE — Progress Notes (Signed)
[name], MD [address] [place] [address]  1. Fatigue  CBC, Basic metabolic panel  2. Right carotid artery occlusion  warfarin (COUMADIN) 2 MG tablet  3. Left carotid artery partial occlusion  warfarin (COUMADIN) 2 MG tablet  4. Invasive ductal carcinoma of breast, stage 2  Cancer antigen 27.29  5. H/O Hodgkin's disease      CURRENT THERAPY: On tamoxifen daily and anticoagulation with Coumadin  INTERVAL HISTORY: Ryan [name] 60 y.o. male returns for  regular  visit for followup of right-sided breast cancer.  The patient denies any complaint.  He denies any night sweats, hot flashes, arthralgias, and myalgias.  On further questioning, he admits to fatigue.  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  We will investigate this further with a CBC and BMET with his next PT/INR.  He remains compliant with his Coumadin.  I went over patient education regarding a change in his PT/INR schedule.  His INR was nearly 3.0 and therefore I opted to bring him in sooner to check it again.  Past Medical History  Diagnosis Date  . Invasive ductal carcinoma of breast, stage 2 04/27/2011  . H/O Hodgkin's disease 04/27/2011  . Right carotid artery occlusion 04/27/2011  . Left carotid artery partial occlusion 04/27/2011  . Breast cancer   . Cancer     hx of hodgkins disease  . Stroke     2007  . Hypercholesterolemia   . Adie's pupil     has Invasive ductal carcinoma of breast, stage 2; H/O Hodgkin's disease; Right carotid artery occlusion; and Left carotid artery partial occlusion on his problem list.      has no known allergies.  Ryan [name] does not currently have medications on file.  Past Surgical History  Procedure Date  . Mastectomy rt    Denies any headaches, dizziness, double vision, fevers, chills, night sweats, nausea, vomiting, diarrhea, constipation, chest pain, heart palpitations, shortness of breath, blood in stool,  black tarry stool, urinary pain, urinary burning, urinary frequency, hematuria.   PHYSICAL EXAMINATION  ECOG PERFORMANCE STATUS: 0 - Asymptomatic  Filed Vitals:   06/09/11 1542  BP: 137/73  Pulse: 90  Temp: 98 F (36.7 C)    GENERAL:alert, healthy, no distress, well nourished, well developed, comfortable, cooperative and smiling SKIN: skin color, texture, turgor are normal, no rashes or significant lesions HEAD: Normocephalic EYES: normal EARS: External ears normal OROPHARYNX:mucous membranes are moist  NECK: supple, no adenopathy, no bruits, no JVD, thyroid normal size, non-tender, without nodularity, no stridor, non-tender, trachea midline LYMPH:  no palpable lymphadenopathy BREAST:surgical scars noted on right breast from mastectomy.  This is well healed. LUNGS: clear to auscultation and percussion HEART: regular rate & rhythm, no murmurs, no gallops, S1 normal and S2 normal ABDOMEN:abdomen soft, non-tender and normal bowel sounds BACK: Back symmetric, no curvature. EXTREMITIES:less then 2 second capillary refill, no joint deformities, effusion, or inflammation, no edema, no skin discoloration, no clubbing, no cyanosis  NEURO: alert & oriented x 3 with fluent speech, no focal motor/sensory deficits, gait normal    LABORATORY DATA: CBC    Component Value Date/Time   WBC 9.9 01/18/2011 0511   RBC 4.52 01/18/2011 0511   HGB 13.8 01/18/2011 0511   HCT 42.1 01/18/2011 0511   PLT 205 01/18/2011 0511   MCV 93.1 01/18/2011 0511   MCH 30.5 01/18/2011 0511   MCHC 32.8 01/18/2011 0511   RDW 12.9 01/18/2011  0511   LYMPHSABS 2.2 01/18/2011 0511   MONOABS 0.9 01/18/2011 0511   EOSABS 0.2 01/18/2011 0511   BASOSABS 0.0 01/18/2011 0511    Lab Results  Component Value Date   INR 3.01* 06/07/2011   INR 2.34* 05/11/2011   INR 2.58* 04/27/2011     Chemistry      Component Value Date/Time   NA 137 01/18/2011 0511   K 4.1 01/18/2011 0511   CL 105 01/18/2011 0511   CO2 25 01/18/2011 0511   BUN  11 01/18/2011 0511   CREATININE 1.10 01/18/2011 0511      Component Value Date/Time   CALCIUM 8.4 01/18/2011 0511   ALKPHOS 50 01/18/2011 0511   AST 17 01/18/2011 0511   ALT 26 01/18/2011 0511   BILITOT 0.7 01/18/2011 0511         ASSESSMENT:  1. Right sided breast cancer, on tamoxifen 2. H/O Lymphoma   PLAN:  1. Lab work with next PT/INR: CBC, BMET, CA 27.29 2. PT/INR as scheduled 3. Return in 3 months 4. Continue Tamoxifen. 5. Refill of Coumadin provided.   All questions were answered. The patient knows to call the clinic with any problems, questions or concerns. We can certainly see the patient much sooner if necessary.  The patient and plan discussed with Glenford Peers, MD and he is in agreement with the aforementioned.   KEFALAS,THOMAS

## 2011-06-09 NOTE — Patient Instructions (Signed)
[facility]  Discharge Instructions  RECOMMENDATIONS MADE BY THE CONSULTANT AND ANY TEST RESULTS WILL BE SENT TO YOUR REFERRING DOCTOR.     MEDICATIONS PRESCRIBED:Same dose Coumadin, PT/INR again on Sept 10th.     SPECIAL INSTRUCTIONS/FOLLOW-UP: CT Scan in October. Return to see MD in 3 months.   I acknowledge that I have been informed and understand all the instructions given to me and received a copy. I do not have any more questions at this time, but understand that I may call the Specialty Clinic at [facility] at [phone] during business hours should I have any further questions or need assistance in obtaining follow-up care.    __________________________________________  _____________  __________ Signature of Patient or Authorized Representative            Date                   Time    __________________________________________ Nurse's Signature

## 2011-06-19 ENCOUNTER — Other Ambulatory Visit (HOSPITAL_COMMUNITY): Payer: Self-pay | Admitting: Oncology

## 2011-06-19 ENCOUNTER — Encounter (HOSPITAL_COMMUNITY): Payer: BC Managed Care – PPO | Attending: Internal Medicine

## 2011-06-19 ENCOUNTER — Telehealth (HOSPITAL_COMMUNITY): Payer: Self-pay

## 2011-06-19 DIAGNOSIS — I6529 Occlusion and stenosis of unspecified carotid artery: Secondary | ICD-10-CM | POA: Insufficient documentation

## 2011-06-19 DIAGNOSIS — I6521 Occlusion and stenosis of right carotid artery: Secondary | ICD-10-CM

## 2011-06-19 DIAGNOSIS — C50919 Malignant neoplasm of unspecified site of unspecified female breast: Secondary | ICD-10-CM

## 2011-06-19 DIAGNOSIS — R5381 Other malaise: Secondary | ICD-10-CM | POA: Insufficient documentation

## 2011-06-19 DIAGNOSIS — I6522 Occlusion and stenosis of left carotid artery: Secondary | ICD-10-CM

## 2011-06-19 DIAGNOSIS — R5383 Other fatigue: Secondary | ICD-10-CM

## 2011-06-19 DIAGNOSIS — C50929 Malignant neoplasm of unspecified site of unspecified male breast: Secondary | ICD-10-CM | POA: Insufficient documentation

## 2011-06-19 LAB — CBC
Hemoglobin: 13.9 g/dL (ref 13.0–17.0)
MCH: 29.4 pg (ref 26.0–34.0)
MCV: 89.2 fL (ref 78.0–100.0)
Platelets: 210 10*3/uL (ref 150–400)
RBC: 4.72 MIL/uL (ref 4.22–5.81)
WBC: 8.4 10*3/uL (ref 4.0–10.5)

## 2011-06-19 LAB — BASIC METABOLIC PANEL
CO2: 25 mEq/L (ref 19–32)
Chloride: 104 mEq/L (ref 96–112)
Creatinine, Ser: 1.08 mg/dL (ref 0.50–1.35)
Glucose, Bld: 74 mg/dL (ref 70–99)
Sodium: 138 mEq/L (ref 135–145)

## 2011-06-19 NOTE — Telephone Encounter (Signed)
Notified to continue same dosage of coumadin .  To RTC 10/8 for next PT/INR.

## 2011-06-20 LAB — CANCER ANTIGEN 27.29: CA 27.29: 13 U/mL (ref 0–39)

## 2011-07-17 ENCOUNTER — Encounter (HOSPITAL_COMMUNITY): Payer: BC Managed Care – PPO | Attending: Internal Medicine

## 2011-07-17 ENCOUNTER — Telehealth (HOSPITAL_COMMUNITY): Payer: Self-pay

## 2011-07-17 DIAGNOSIS — I6529 Occlusion and stenosis of unspecified carotid artery: Secondary | ICD-10-CM | POA: Insufficient documentation

## 2011-07-17 DIAGNOSIS — I6522 Occlusion and stenosis of left carotid artery: Secondary | ICD-10-CM

## 2011-07-17 DIAGNOSIS — I6521 Occlusion and stenosis of right carotid artery: Secondary | ICD-10-CM

## 2011-07-17 DIAGNOSIS — I8290 Acute embolism and thrombosis of unspecified vein: Secondary | ICD-10-CM

## 2011-07-17 DIAGNOSIS — I749 Embolism and thrombosis of unspecified artery: Secondary | ICD-10-CM | POA: Insufficient documentation

## 2011-07-17 NOTE — Progress Notes (Signed)
Results of PT/INR discussed with Dr. Mariel Sleet.  Patient to change coumadin to 5 mg/ 5 mg/ 4 mg, etc and to RTC on 10/15 for PT/INR.  Mrs. Pascual notified.

## 2011-07-17 NOTE — Progress Notes (Signed)
Ryan [name] presented for labwork. Labs per MD order drawn via Peripheral Line 25 gauge needle inserted in rt arm.  Good blood return present. Procedure without incident.  Needle removed intact. Patient tolerated procedure well.  Coumadin dose 5/4/4

## 2011-07-17 NOTE — Telephone Encounter (Signed)
error 

## 2011-07-24 ENCOUNTER — Encounter (HOSPITAL_BASED_OUTPATIENT_CLINIC_OR_DEPARTMENT_OTHER): Payer: BC Managed Care – PPO

## 2011-07-24 ENCOUNTER — Ambulatory Visit (HOSPITAL_COMMUNITY)
Admission: RE | Admit: 2011-07-24 | Discharge: 2011-07-24 | Disposition: A | Discharge status: Home / Self Care | Payer: BC Managed Care – PPO | Source: Ambulatory Visit | Attending: Oncology | Admitting: Oncology

## 2011-07-24 ENCOUNTER — Other Ambulatory Visit (HOSPITAL_COMMUNITY): Payer: Self-pay | Admitting: Oncology

## 2011-07-24 ENCOUNTER — Encounter (HOSPITAL_COMMUNITY): Payer: Self-pay

## 2011-07-24 DIAGNOSIS — R911 Solitary pulmonary nodule: Secondary | ICD-10-CM

## 2011-07-24 DIAGNOSIS — C50929 Malignant neoplasm of unspecified site of unspecified male breast: Secondary | ICD-10-CM | POA: Insufficient documentation

## 2011-07-24 DIAGNOSIS — I749 Embolism and thrombosis of unspecified artery: Secondary | ICD-10-CM

## 2011-07-24 DIAGNOSIS — J984 Other disorders of lung: Secondary | ICD-10-CM | POA: Insufficient documentation

## 2011-07-24 DIAGNOSIS — I8290 Acute embolism and thrombosis of unspecified vein: Secondary | ICD-10-CM

## 2011-07-24 DIAGNOSIS — Z87898 Personal history of other specified conditions: Secondary | ICD-10-CM | POA: Insufficient documentation

## 2011-07-24 DIAGNOSIS — I6522 Occlusion and stenosis of left carotid artery: Secondary | ICD-10-CM

## 2011-07-24 DIAGNOSIS — I6521 Occlusion and stenosis of right carotid artery: Secondary | ICD-10-CM

## 2011-07-24 DIAGNOSIS — C50919 Malignant neoplasm of unspecified site of unspecified female breast: Secondary | ICD-10-CM

## 2011-07-24 LAB — PROTIME-INR: INR: 2.24 — ABNORMAL HIGH (ref 0.00–1.49)

## 2011-07-24 NOTE — Progress Notes (Signed)
Ryan [name] presented for labwork. Labs per MD order drawn via Peripheral Line 24 gauge needle inserted in lt ac Good blood return present. Procedure without incident.  Needle removed intact. Patient tolerated procedure well.   

## 2011-07-24 NOTE — Progress Notes (Signed)
Pt notified of INR and to continue current coumadin dose and to return in 2 weeks. 10/29 @ 4. Pt verbalized understanding of instructions.

## 2011-08-07 ENCOUNTER — Encounter (HOSPITAL_BASED_OUTPATIENT_CLINIC_OR_DEPARTMENT_OTHER): Payer: BC Managed Care – PPO

## 2011-08-07 DIAGNOSIS — I749 Embolism and thrombosis of unspecified artery: Secondary | ICD-10-CM

## 2011-08-07 DIAGNOSIS — I6521 Occlusion and stenosis of right carotid artery: Secondary | ICD-10-CM

## 2011-08-07 DIAGNOSIS — I6522 Occlusion and stenosis of left carotid artery: Secondary | ICD-10-CM

## 2011-08-07 LAB — PROTIME-INR: Prothrombin Time: 28.8 seconds — ABNORMAL HIGH (ref 11.6–15.2)

## 2011-08-07 NOTE — Progress Notes (Signed)
Ryan [name] presented for labwork. Labs per MD order drawn via Peripheral Line 23 gauge needle inserted in left AC (PT/INR) Good blood return present. Procedure without incident.  Needle removed intact. Patient tolerated procedure well. Taking coumadin 5 mg, 5 mg, 4 mg, 5 mg, 5 mg,4 mg, etc. Notes Recorded by Evelena Leyden, RN on 08/08/2011 at 5:46 PM Patient notified to continue coumadin 5 mg, 5 mg, 4 mg, 5 mg, 5 mg, 4 mg, etc. To return for next PT level on 08/28/11. Complains with itching @ surgical scar site on chest. No rash or redness noted by patient. Instructed to let us know if rash, lumps,etc develop. Sees Dr. Mariel Sleet on 09/04/11. Notes Recorded by Dellis Anes, PA on 08/08/2011 at 12:22 PM Same dose  PT/INR in 3 weeks

## 2011-08-08 ENCOUNTER — Other Ambulatory Visit (HOSPITAL_COMMUNITY): Payer: Self-pay | Admitting: Oncology

## 2011-08-08 ENCOUNTER — Ambulatory Visit (HOSPITAL_COMMUNITY): Payer: Self-pay

## 2011-08-08 DIAGNOSIS — I6522 Occlusion and stenosis of left carotid artery: Secondary | ICD-10-CM

## 2011-08-08 DIAGNOSIS — I6521 Occlusion and stenosis of right carotid artery: Secondary | ICD-10-CM

## 2011-08-28 ENCOUNTER — Other Ambulatory Visit (HOSPITAL_COMMUNITY): Payer: Self-pay

## 2011-08-28 ENCOUNTER — Encounter (HOSPITAL_COMMUNITY): Payer: BC Managed Care – PPO | Attending: Internal Medicine

## 2011-08-28 DIAGNOSIS — I6521 Occlusion and stenosis of right carotid artery: Secondary | ICD-10-CM

## 2011-08-28 DIAGNOSIS — I8290 Acute embolism and thrombosis of unspecified vein: Secondary | ICD-10-CM

## 2011-08-28 DIAGNOSIS — I6522 Occlusion and stenosis of left carotid artery: Secondary | ICD-10-CM

## 2011-08-28 DIAGNOSIS — I6529 Occlusion and stenosis of unspecified carotid artery: Secondary | ICD-10-CM | POA: Insufficient documentation

## 2011-08-28 LAB — PROTIME-INR
INR: 2.53 — ABNORMAL HIGH (ref 0.00–1.49)
Prothrombin Time: 27.7 seconds — ABNORMAL HIGH (ref 11.6–15.2)

## 2011-08-28 NOTE — Progress Notes (Signed)
Ryan [name] presented for labwork. Labs per MD order drawn via Peripheral Line 24 gauge needle inserted in lt ac Good blood return present. Procedure without incident.  Needle removed intact. Patient tolerated procedure well.

## 2011-08-28 NOTE — Progress Notes (Signed)
Pt on 5/5/4.

## 2011-09-04 ENCOUNTER — Encounter (HOSPITAL_COMMUNITY): Payer: Self-pay | Admitting: Oncology

## 2011-09-04 ENCOUNTER — Encounter (HOSPITAL_BASED_OUTPATIENT_CLINIC_OR_DEPARTMENT_OTHER): Payer: BC Managed Care – PPO | Admitting: Oncology

## 2011-09-04 VITALS — BP 125/74 | HR 88 | Temp 97.5°F | Ht 75.0 in | Wt 244.6 lb

## 2011-09-04 DIAGNOSIS — C50929 Malignant neoplasm of unspecified site of unspecified male breast: Secondary | ICD-10-CM

## 2011-09-04 DIAGNOSIS — Z7901 Long term (current) use of anticoagulants: Secondary | ICD-10-CM

## 2011-09-04 DIAGNOSIS — C50919 Malignant neoplasm of unspecified site of unspecified female breast: Secondary | ICD-10-CM

## 2011-09-04 DIAGNOSIS — Z170 Estrogen receptor positive status [ER+]: Secondary | ICD-10-CM

## 2011-09-04 DIAGNOSIS — C8190 Hodgkin lymphoma, unspecified, unspecified site: Secondary | ICD-10-CM

## 2011-09-04 NOTE — Progress Notes (Signed)
CC:   Ryan [name]. Sherwood Gambler, MD Barbaraann Barthel, M.D. Genene Churn. Love, M.D.  DIAGNOSES: 1. Invasive ductal carcinoma right breast stage II (T2 N0), grade 1.     No lymphovascular invasion.  Estrogen-receptor positive 96%,     progesterone-receptor positive 84%, Ki-67 marker low at 19%.  HER-     2/neu negative.  His oncotype score confirmed the above findings     with a recurrence score of only 14 giving him an average rate of     distant recurrence after 5 years of tamoxifen of 9% over the next     10 years.  He has now on tamoxifen. 2. History right carotid artery occlusion with a stroke in 2007.  He     is on Coumadin now in addition to his baby aspirin. 3. Hodgkin's disease in 1983 treated with radiation therapy with a     complete remission at that time. 4. 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 5. Hypercholesterolemia on therapy. 6. History of chronic anxiety and depression.  NARRATIVE:  [facility] is here with his wife.  His INRs are very nicely therapeutic.  He is on 5, 5 and 4 mg successively of Coumadin.  His INR next is due Wednesday.  He is having no trouble with the tamoxifen other than he states he is tired, but he is getting ready to retire at the end of December 2012 once and for all.  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  He does not have anything new or different other than the weakness and fatigue, he states.  He has noticed it really over the last couple of years not necessarily just with the tamoxifen.  PHYSICAL EXAMINATION:  General:  Height 6 feet, 3 inches, weight 244 pounds.  BMI is 30.  Vital Signs:  Blood pressure 125/74 in the left arm sitting position, pulse 88 and regular, respirations 16 and unlabored. He is afebrile, complaining no pain presently.  Chest wall:  The right chest wall scar is well healed.  There is no nodularity.  The left breast area is negative as well. Lymphatics:  He has no  adenopathy in the cervical, supraclavicular, infraclavicular, axillary, or inguinal areas. Lungs:  His lungs are clear to auscultation and percussion. Heart:  Heart shows a regular rhythm and rate without murmur rub or gallop.  Abdomen:  Soft, nontender, without organomegaly.  He has no peripheral edema.  He is getting somewhat obese in the abdomen area, however.  Tejay weighed probably under 200 pounds when I first saw him in 1983. He has gained a lot a weight, quoting his wife, especially in the last several years.  He has not been able to exercise as much since his hours were changed, she states, about 5 years ago.  He does need to stay more active.  Hopefully that will take place once he retires.  I think he is doing well.  I will see him in 6 months.  We will continue monitoring his INRs.  His tamoxifen he is handling very well.    ______________________________ Ladona Horns. Mariel Sleet, MD ESN/MEDQ  D:  09/04/2011  T:  09/04/2011  Job:  [id number]

## 2011-09-04 NOTE — Patient Instructions (Signed)
Ryan [name]  [id number] 09/15/1951  [facility]  Discharge Instructions  RECOMMENDATIONS MADE BY THE CONSULTANT AND ANY TEST RESULTS WILL BE SENT TO YOUR REFERRING DOCTOR.   EXAM FINDINGS BY MD TODAY AND SIGNS AND SYMPTOMS TO REPORT TO CLINIC OR PRIMARY MD: You are doing well.  No changes in therapy.  MEDICATIONS PRESCRIBED: none   INSTRUCTIONS GIVEN AND DISCUSSED: Other :  Report any new lumps, bone pain or shortness of breath.  SPECIAL INSTRUCTIONS/FOLLOW-UP: Lab work Needed as scheduled and Return to Clinic in 6 months to see MD.   I acknowledge that I have been informed and understand all the instructions given to me and received a copy. I do not have any more questions at this time, but understand that I may call the Specialty Clinic at [facility] at [phone] during business hours should I have any further questions or need assistance in obtaining follow-up care.    __________________________________________  _____________  __________ Signature of Patient or Authorized Representative            Date                   Time    __________________________________________ Nurse's Signature

## 2011-09-04 NOTE — Progress Notes (Signed)
This office note has been dictated.

## 2011-09-25 ENCOUNTER — Other Ambulatory Visit (HOSPITAL_COMMUNITY): Payer: Self-pay | Admitting: Oncology

## 2011-09-25 ENCOUNTER — Encounter (HOSPITAL_COMMUNITY): Payer: BC Managed Care – PPO | Attending: Internal Medicine

## 2011-09-25 DIAGNOSIS — I8290 Acute embolism and thrombosis of unspecified vein: Secondary | ICD-10-CM

## 2011-09-25 DIAGNOSIS — Z8673 Personal history of transient ischemic attack (TIA), and cerebral infarction without residual deficits: Secondary | ICD-10-CM

## 2011-09-25 DIAGNOSIS — I6521 Occlusion and stenosis of right carotid artery: Secondary | ICD-10-CM

## 2011-09-25 DIAGNOSIS — I749 Embolism and thrombosis of unspecified artery: Secondary | ICD-10-CM | POA: Insufficient documentation

## 2011-09-25 DIAGNOSIS — I6522 Occlusion and stenosis of left carotid artery: Secondary | ICD-10-CM

## 2011-09-25 LAB — PROTIME-INR: Prothrombin Time: 26.4 seconds — ABNORMAL HIGH (ref 11.6–15.2)

## 2011-09-25 NOTE — Progress Notes (Signed)
Ryan [name] presented for labwork. Labs per MD order drawn via Peripheral Line 23 gauge needle inserted in left antecubital.  Good blood return present. PT/INR drawn. Current Coumadin dose is 5mg  5mg  4mg  alternating. Procedure without incident.  Needle removed intact. Patient tolerated procedure well.

## 2011-09-26 ENCOUNTER — Telehealth (HOSPITAL_COMMUNITY): Payer: Self-pay | Admitting: *Deleted

## 2011-09-26 NOTE — Telephone Encounter (Signed)
Spoke with pt's wife Orlovista. Instructions as below as well as next appointment date/time given. Verbalized understanding.

## 2011-09-26 NOTE — Telephone Encounter (Signed)
Message copied by Dennie Maizes on Tue Sep 26, 2011  9:41 AM ------      Message from: Ellouise Newer III      Created: Mon Sep 25, 2011  7:24 PM       Same dose            PT/INR in 4 weeks

## 2011-10-16 ENCOUNTER — Other Ambulatory Visit: Payer: Self-pay | Admitting: General Surgery

## 2011-10-16 ENCOUNTER — Other Ambulatory Visit (HOSPITAL_COMMUNITY)
Admission: RE | Admit: 2011-10-16 | Discharge: 2011-10-16 | Disposition: A | Discharge status: Home / Self Care | Payer: BC Managed Care – PPO | Source: Ambulatory Visit | Attending: General Surgery | Admitting: General Surgery

## 2011-10-16 DIAGNOSIS — B079 Viral wart, unspecified: Secondary | ICD-10-CM | POA: Insufficient documentation

## 2011-10-16 DIAGNOSIS — L905 Scar conditions and fibrosis of skin: Secondary | ICD-10-CM | POA: Insufficient documentation

## 2011-10-23 ENCOUNTER — Other Ambulatory Visit (HOSPITAL_COMMUNITY): Payer: Self-pay | Admitting: Oncology

## 2011-10-23 ENCOUNTER — Encounter (HOSPITAL_COMMUNITY): Payer: BC Managed Care – PPO | Attending: Internal Medicine

## 2011-10-23 DIAGNOSIS — I6521 Occlusion and stenosis of right carotid artery: Secondary | ICD-10-CM

## 2011-10-23 DIAGNOSIS — I6522 Occlusion and stenosis of left carotid artery: Secondary | ICD-10-CM

## 2011-10-23 DIAGNOSIS — I6529 Occlusion and stenosis of unspecified carotid artery: Secondary | ICD-10-CM | POA: Insufficient documentation

## 2011-10-23 LAB — PROTIME-INR
INR: 3.11 — ABNORMAL HIGH (ref 0.00–1.49)
Prothrombin Time: 32.5 seconds — ABNORMAL HIGH (ref 11.6–15.2)

## 2011-10-23 NOTE — Progress Notes (Signed)
Labs drawn today for pt.  Patient on 5,5,4mg  of coud.  Call patient at [phone]

## 2011-10-23 NOTE — Progress Notes (Signed)
Continue same dose of coumadin and repeat in 1 week

## 2011-10-30 ENCOUNTER — Encounter (HOSPITAL_BASED_OUTPATIENT_CLINIC_OR_DEPARTMENT_OTHER): Payer: BC Managed Care – PPO

## 2011-10-30 ENCOUNTER — Other Ambulatory Visit (HOSPITAL_COMMUNITY): Payer: Self-pay | Admitting: Oncology

## 2011-10-30 ENCOUNTER — Telehealth (HOSPITAL_COMMUNITY): Payer: Self-pay | Admitting: *Deleted

## 2011-10-30 ENCOUNTER — Other Ambulatory Visit (HOSPITAL_COMMUNITY): Payer: BC Managed Care – PPO

## 2011-10-30 DIAGNOSIS — I6522 Occlusion and stenosis of left carotid artery: Secondary | ICD-10-CM

## 2011-10-30 DIAGNOSIS — I6529 Occlusion and stenosis of unspecified carotid artery: Secondary | ICD-10-CM

## 2011-10-30 DIAGNOSIS — I6521 Occlusion and stenosis of right carotid artery: Secondary | ICD-10-CM

## 2011-10-30 LAB — PROTIME-INR: INR: 2.72 — ABNORMAL HIGH (ref 0.00–1.49)

## 2011-10-30 NOTE — Progress Notes (Signed)
Labs drawn today for pt.  Patient on 5, 5, and 4mg alternating.  Call patient at [phone] 

## 2011-10-30 NOTE — Telephone Encounter (Signed)
Error

## 2011-11-27 ENCOUNTER — Encounter (HOSPITAL_COMMUNITY): Payer: BC Managed Care – PPO | Attending: Internal Medicine

## 2011-11-27 ENCOUNTER — Other Ambulatory Visit (HOSPITAL_COMMUNITY): Payer: Self-pay | Admitting: Oncology

## 2011-11-27 DIAGNOSIS — I6521 Occlusion and stenosis of right carotid artery: Secondary | ICD-10-CM

## 2011-11-27 DIAGNOSIS — I6529 Occlusion and stenosis of unspecified carotid artery: Secondary | ICD-10-CM | POA: Insufficient documentation

## 2011-11-27 DIAGNOSIS — I6522 Occlusion and stenosis of left carotid artery: Secondary | ICD-10-CM

## 2011-11-27 LAB — PROTIME-INR
INR: 3.18 — ABNORMAL HIGH (ref 0.00–1.49)
Prothrombin Time: 33.1 seconds — ABNORMAL HIGH (ref 11.6–15.2)

## 2011-11-27 NOTE — Progress Notes (Signed)
Labs drawn today for pt.  Patient on 5, 5, and 4mg  alternating.  Call patient at [phone]

## 2011-12-08 ENCOUNTER — Encounter (HOSPITAL_COMMUNITY): Payer: BC Managed Care – PPO | Attending: Internal Medicine

## 2011-12-08 ENCOUNTER — Telehealth (HOSPITAL_COMMUNITY): Payer: Self-pay | Admitting: *Deleted

## 2011-12-08 ENCOUNTER — Other Ambulatory Visit (HOSPITAL_COMMUNITY): Payer: Self-pay | Admitting: Oncology

## 2011-12-08 DIAGNOSIS — I6522 Occlusion and stenosis of left carotid artery: Secondary | ICD-10-CM

## 2011-12-08 DIAGNOSIS — I6521 Occlusion and stenosis of right carotid artery: Secondary | ICD-10-CM

## 2011-12-08 DIAGNOSIS — I6529 Occlusion and stenosis of unspecified carotid artery: Secondary | ICD-10-CM | POA: Insufficient documentation

## 2011-12-08 DIAGNOSIS — C50929 Malignant neoplasm of unspecified site of unspecified male breast: Secondary | ICD-10-CM | POA: Insufficient documentation

## 2011-12-08 LAB — PROTIME-INR: INR: 3.94 — ABNORMAL HIGH (ref 0.00–1.49)

## 2011-12-08 NOTE — Telephone Encounter (Signed)
Message copied by Dennie Maizes on Fri Dec 08, 2011 11:50 AM ------      Message from: Ellouise Newer III      Created: Fri Dec 08, 2011 11:30 AM       Hold Coumadin x 2 days            Restart same dose            PT/INR in on Wednesday

## 2011-12-08 NOTE — Telephone Encounter (Signed)
Spoke with pt as below. Verbalized understanding. He is to have lab work next [employer] and see the doctor about his rash.

## 2011-12-13 ENCOUNTER — Encounter (HOSPITAL_BASED_OUTPATIENT_CLINIC_OR_DEPARTMENT_OTHER): Payer: BC Managed Care – PPO | Admitting: Oncology

## 2011-12-13 ENCOUNTER — Encounter (HOSPITAL_COMMUNITY): Payer: BC Managed Care – PPO

## 2011-12-13 ENCOUNTER — Other Ambulatory Visit (HOSPITAL_COMMUNITY): Payer: Self-pay | Admitting: Oncology

## 2011-12-13 VITALS — BP 162/80 | HR 82 | Temp 97.8°F | Ht 75.0 in | Wt 247.2 lb

## 2011-12-13 DIAGNOSIS — I6521 Occlusion and stenosis of right carotid artery: Secondary | ICD-10-CM

## 2011-12-13 DIAGNOSIS — I6522 Occlusion and stenosis of left carotid artery: Secondary | ICD-10-CM

## 2011-12-13 DIAGNOSIS — C50929 Malignant neoplasm of unspecified site of unspecified male breast: Secondary | ICD-10-CM

## 2011-12-13 DIAGNOSIS — C50919 Malignant neoplasm of unspecified site of unspecified female breast: Secondary | ICD-10-CM

## 2011-12-13 DIAGNOSIS — C8190 Hodgkin lymphoma, unspecified, unspecified site: Secondary | ICD-10-CM

## 2011-12-13 LAB — PROTIME-INR: Prothrombin Time: 27.7 seconds — ABNORMAL HIGH (ref 11.6–15.2)

## 2011-12-13 NOTE — Progress Notes (Signed)
Ryan [name]., MD, MD [address] [place] [address]  1. Invasive ductal carcinoma of breast, stage 2  albuterol (PROVENTIL) 2 MG tablet, CBC, Differential, Comprehensive metabolic panel  2. H/O Hodgkin's disease      CURRENT THERAPY: On Tamoxifen daily.    INTERVAL HISTORY: Ryan [name] 61 y.o. male returns for  regular  visit for followup of  Invasive ductal carcinoma right breast stage II (T2 N0), grade 1. No lymphovascular invasion. Estrogen-receptor positive 96%, progesterone-receptor positive 84%, Ki-67 marker low at 19%. HER- 2/neu negative. His oncotype score confirmed the above findings with a recurrence score of only 14 giving him an average rate of distant recurrence after 5 years of tamoxifen of 9% over the next 10 years. He is now on tamoxifen.   The patient retired and he is happy with that decision.  He is enjoying retirement.  The patient reports a rash that began about one week ago.  It is localized to his chest and upper back.  It is pruritic.  He has been on his Tamoxifen for nearly 1 year and I do not suspect it is from this.  The rash is blanchable and diffuse.  He reports that it is resolving with hydrocortisone.  He denies any change in detergent, soap, shampoo, or any other products that may come in contact with his skin.   He also relays a moment when he was supratherapeutic with his Coumadin.  He was reclining in his recliner with his hands behind his head.  He noticed some numbness and tingling of his left upper extremity.  It resolved, but his thumb and 2nd digit remained numb for a few moments and then resolved.  He reports that his had was acting "dumb."  He denies any slurring of speech or visual changes.  He denies any residual weakness or symptoms of the left upper extremity.  He denies any LE symptoms.  He reports it resolved on its own that day.  Otherwise, the patient denies any complaints. ROS: No TIA's or unusual headaches, no  dysphagia.  No prolonged cough. No dyspnea or chest pain on exertion.  No abdominal pain, change in bowel habits, black or bloody stools.  No urinary tract symptoms.  No new or unusual musculoskeletal symptoms.  No new breast lumps, breast pain or nipple discharge.   Past Medical History  Diagnosis Date  . Invasive ductal carcinoma of breast, stage 2 04/27/2011  . Right carotid artery occlusion 04/27/2011  . Left carotid artery partial occlusion 04/27/2011  . Stroke     2007  . Hypercholesterolemia   . Adie's pupil   . H/O Hodgkin's disease 04/27/2011  . Breast cancer   . Cancer     hx of hodgkins disease    has Invasive ductal carcinoma of breast, stage 2; H/O Hodgkin's disease; Right carotid artery occlusion; and Left carotid artery partial occlusion on his problem list.      has no known allergies.  Ryan [name] does not currently have medications on file.  Past Surgical History  Procedure Date  . Mastectomy rt  . Lymph node biopsy     left neck    Denies any headaches, dizziness, double vision, fevers, chills, night sweats, nausea, vomiting, diarrhea, constipation, chest pain, heart palpitations, shortness of breath, blood in stool, black tarry stool, urinary pain, urinary burning, urinary frequency, hematuria.   PHYSICAL EXAMINATION  ECOG PERFORMANCE STATUS: 0 - Asymptomatic  Filed Vitals:   12/13/11 0939  BP:  162/80  Pulse: 82  Temp: 97.8 F (36.6 C)    GENERAL:alert, no distress, well nourished, well developed, comfortable, cooperative and smiling SKIN: skin color, texture, turgor are normal, diffuse erythematous rash that is blanchable in the upper chest and upper back. HEAD: Normocephalic, No masses, lesions, tenderness or abnormalities EYES: normal EARS: External ears normal OROPHARYNX:mucous membranes are moist  NECK: supple, no adenopathy, no bruits, thyroid normal size, non-tender, without nodularity, no stridor, non-tender, trachea midline LYMPH:  no  palpable lymphadenopathy BREAST:left breast normal without mass, skin or nipple changes or axillary nodes, post-mastectomy site well healed and free of suspicious changes LUNGS: clear to auscultation and percussion HEART: regular rate & rhythm, no murmurs, no gallops, S1 normal and S2 normal ABDOMEN:abdomen soft, non-tender, obese and normal bowel sounds BACK: Back symmetric, no curvature., No CVA tenderness EXTREMITIES:less then 2 second capillary refill, no joint deformities, effusion, or inflammation, no edema, no skin discoloration, no clubbing, no cyanosis  NEURO: alert & oriented x 3 with fluent speech, no focal motor/sensory deficits, gait normal   PENDING LABS: PT/INR    ASSESSMENT:  1. Invasive ductal carcinoma right breast stage II (T2 N0), grade 1. No lymphovascular invasion. Estrogen-receptor positive 96%, progesterone-receptor positive 84%, Ki-67 marker low at 19%. HER- 2/neu negative. His oncotype score confirmed the above findings with a recurrence score of only 14 giving him an average rate of distant recurrence after 5 years of tamoxifen of 9% over the next 10 years. He is now on tamoxifen.  2. Rash on chest and upper back. 3. Left thumb and 2nd digit numbness, short-lasting, resolved on its own.  Question lack of blood supply secondary to left upper extremity positioning versus TIA (although less likely). 4. History right carotid artery occlusion with a stroke in 2007. He is on Coumadin now in addition to his baby aspirin.  5. Hodgkin's disease in 1983 treated with radiation therapy with a complete remission at that time.  6. 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  7. Hypercholesterolemia on therapy.  8. History of chronic anxiety and depression.   PLAN:  1. Benadryl PO for the itching 2. Continue Hydrocortisone cream topically.  3. If he finishes his Hydrocortisone cream, will call in Rx for refill.  He will call the clinic if he  needs more cream. 4. Lab work in 3 months: CBC diff, CMET 5. Continue coumadin as directed 6. Return as scheduled in May for follow-up.   All questions were answered. The patient knows to call the clinic with any problems, questions or concerns. We can certainly see the patient much sooner if necessary.  The patient and plan discussed with Glenford Peers, MD and he is in agreement with the aforementioned.   Ryan [name]

## 2011-12-13 NOTE — Patient Instructions (Signed)
Ryan [name]  [id number] 13-Oct-1950   [facility]  Discharge Instructions  RECOMMENDATIONS MADE BY THE CONSULTANT AND ANY TEST RESULTS WILL BE SENT TO YOUR REFERRING DOCTOR.   EXAM FINDINGS BY MD TODAY AND SIGNS AND SYMPTOMS TO REPORT TO CLINIC OR PRIMARY MD: you are doing ok.  Use benadry for the itching and hydrocortisone to the area as needed.  MEDICATIONS PRESCRIBED: none   INSTRUCTIONS GIVEN AND DISCUSSED: Other :  Report any new lumps, bone pain or shortness of breath.  SPECIAL INSTRUCTIONS/FOLLOW-UP: Lab work Needed in May and Return to Clinic on in May as scheduled   I acknowledge that I have been informed and understand all the instructions given to me and received a copy. I do not have any more questions at this time, but understand that I may call the Specialty Clinic at [facility] at [phone] during business hours should I have any further questions or need assistance in obtaining follow-up care.    __________________________________________  _____________  __________ Signature of Patient or Authorized Representative            Date                   Time    __________________________________________ Nurse's Signature

## 2011-12-20 ENCOUNTER — Encounter (HOSPITAL_BASED_OUTPATIENT_CLINIC_OR_DEPARTMENT_OTHER): Payer: BC Managed Care – PPO

## 2011-12-20 ENCOUNTER — Other Ambulatory Visit (HOSPITAL_COMMUNITY): Payer: Self-pay | Admitting: Oncology

## 2011-12-20 DIAGNOSIS — I6522 Occlusion and stenosis of left carotid artery: Secondary | ICD-10-CM

## 2011-12-20 DIAGNOSIS — I6521 Occlusion and stenosis of right carotid artery: Secondary | ICD-10-CM

## 2011-12-20 DIAGNOSIS — I6529 Occlusion and stenosis of unspecified carotid artery: Secondary | ICD-10-CM

## 2011-12-20 NOTE — Progress Notes (Signed)
Labs drawn today for pt.  Patient on 5, 5 and 4mg  alternating.  Call patient at [phone]

## 2011-12-29 ENCOUNTER — Encounter (HOSPITAL_BASED_OUTPATIENT_CLINIC_OR_DEPARTMENT_OTHER): Payer: BC Managed Care – PPO

## 2011-12-29 ENCOUNTER — Other Ambulatory Visit (HOSPITAL_COMMUNITY): Payer: Self-pay | Admitting: Oncology

## 2011-12-29 DIAGNOSIS — I6522 Occlusion and stenosis of left carotid artery: Secondary | ICD-10-CM

## 2011-12-29 DIAGNOSIS — I6529 Occlusion and stenosis of unspecified carotid artery: Secondary | ICD-10-CM

## 2011-12-29 DIAGNOSIS — I6521 Occlusion and stenosis of right carotid artery: Secondary | ICD-10-CM

## 2011-12-29 LAB — PROTIME-INR
INR: 2.91 — ABNORMAL HIGH (ref 0.00–1.49)
Prothrombin Time: 30.9 seconds — ABNORMAL HIGH (ref 11.6–15.2)

## 2011-12-29 NOTE — Progress Notes (Signed)
Labs drawn today for pt.  Patinet on 5,5,and 4mg  alternating.  Call patient at [phone]

## 2012-01-04 ENCOUNTER — Telehealth (HOSPITAL_COMMUNITY): Payer: Self-pay | Admitting: *Deleted

## 2012-01-04 ENCOUNTER — Other Ambulatory Visit (HOSPITAL_COMMUNITY): Payer: Self-pay | Admitting: Oncology

## 2012-01-04 ENCOUNTER — Encounter (HOSPITAL_COMMUNITY): Payer: BC Managed Care – PPO

## 2012-01-04 DIAGNOSIS — I6522 Occlusion and stenosis of left carotid artery: Secondary | ICD-10-CM

## 2012-01-04 DIAGNOSIS — I6521 Occlusion and stenosis of right carotid artery: Secondary | ICD-10-CM

## 2012-01-04 LAB — PROTIME-INR: Prothrombin Time: 28 seconds — ABNORMAL HIGH (ref 11.6–15.2)

## 2012-01-04 NOTE — Telephone Encounter (Signed)
Spoke with pt's wife Winston. Instruct to continue same dose coumadin and RTC in 2 weeks for lab work. Verbalized understanding.

## 2012-01-04 NOTE — Telephone Encounter (Signed)
Message copied by Dennie Maizes on Thu Jan 04, 2012  1:03 PM ------      Message from: Ellouise Newer III      Created: Thu Jan 04, 2012 11:29 AM       Same dose            PT/INR in 2 weeks

## 2012-01-18 ENCOUNTER — Other Ambulatory Visit (HOSPITAL_COMMUNITY): Payer: Self-pay | Admitting: Oncology

## 2012-01-18 ENCOUNTER — Encounter (HOSPITAL_COMMUNITY): Payer: BC Managed Care – PPO | Attending: Internal Medicine

## 2012-01-18 ENCOUNTER — Telehealth (HOSPITAL_COMMUNITY): Payer: Self-pay | Admitting: *Deleted

## 2012-01-18 DIAGNOSIS — I6521 Occlusion and stenosis of right carotid artery: Secondary | ICD-10-CM

## 2012-01-18 DIAGNOSIS — I6529 Occlusion and stenosis of unspecified carotid artery: Secondary | ICD-10-CM | POA: Insufficient documentation

## 2012-01-18 DIAGNOSIS — I6522 Occlusion and stenosis of left carotid artery: Secondary | ICD-10-CM

## 2012-01-18 LAB — PROTIME-INR
INR: 3.56 — ABNORMAL HIGH (ref 0.00–1.49)
Prothrombin Time: 36.1 seconds — ABNORMAL HIGH (ref 11.6–15.2)

## 2012-01-18 NOTE — Telephone Encounter (Signed)
Message copied by Dennie Maizes on Thu Jan 18, 2012 11:39 AM ------      Message from: Ellouise Newer III      Created: Thu Jan 18, 2012 11:20 AM       Hold Coumadin x 2 days            Restart same dose            PT/INR in 1 week

## 2012-01-18 NOTE — Telephone Encounter (Signed)
Left message on answering machine as below. Instruct to call clinic if any questions.

## 2012-01-19 NOTE — Progress Notes (Signed)
Lab draw

## 2012-01-25 ENCOUNTER — Other Ambulatory Visit (HOSPITAL_COMMUNITY): Payer: Self-pay | Admitting: Oncology

## 2012-01-25 ENCOUNTER — Encounter (HOSPITAL_COMMUNITY): Payer: BC Managed Care – PPO

## 2012-01-25 DIAGNOSIS — I6521 Occlusion and stenosis of right carotid artery: Secondary | ICD-10-CM

## 2012-01-25 DIAGNOSIS — I6522 Occlusion and stenosis of left carotid artery: Secondary | ICD-10-CM

## 2012-01-25 LAB — PROTIME-INR: Prothrombin Time: 32.6 seconds — ABNORMAL HIGH (ref 11.6–15.2)

## 2012-01-25 NOTE — Progress Notes (Signed)
PT level today and pt called with instructions

## 2012-01-26 NOTE — Progress Notes (Signed)
Labs drawn

## 2012-01-31 ENCOUNTER — Other Ambulatory Visit (HOSPITAL_COMMUNITY): Payer: BC Managed Care – PPO

## 2012-02-01 ENCOUNTER — Other Ambulatory Visit (HOSPITAL_COMMUNITY): Payer: Self-pay | Admitting: Oncology

## 2012-02-01 ENCOUNTER — Telehealth (HOSPITAL_COMMUNITY): Payer: Self-pay | Admitting: *Deleted

## 2012-02-01 ENCOUNTER — Encounter (HOSPITAL_COMMUNITY): Payer: BC Managed Care – PPO

## 2012-02-01 DIAGNOSIS — I6521 Occlusion and stenosis of right carotid artery: Secondary | ICD-10-CM

## 2012-02-01 DIAGNOSIS — I6522 Occlusion and stenosis of left carotid artery: Secondary | ICD-10-CM

## 2012-02-01 LAB — PROTIME-INR: Prothrombin Time: 35.8 seconds — ABNORMAL HIGH (ref 11.6–15.2)

## 2012-02-01 NOTE — Telephone Encounter (Signed)
Left message on answering machine. Pt to hold Coumadin x 1 night. Start Coumadin 4 mg on Fri, 4 mg on Sat and 4 mg on Sun then continue same rotation. RTC 5/2 for pt/inr. Requested return call to verify understanding.

## 2012-02-01 NOTE — Telephone Encounter (Signed)
Message copied by Dennie Maizes on Thu Feb 01, 2012  1:22 PM ------      Message from: Ellouise Newer III      Created: Thu Feb 01, 2012 12:48 PM       What dose is he on?

## 2012-02-08 ENCOUNTER — Other Ambulatory Visit (HOSPITAL_COMMUNITY): Payer: Self-pay | Admitting: Oncology

## 2012-02-08 ENCOUNTER — Encounter (HOSPITAL_COMMUNITY): Payer: BC Managed Care – PPO | Attending: Internal Medicine

## 2012-02-08 DIAGNOSIS — I6529 Occlusion and stenosis of unspecified carotid artery: Secondary | ICD-10-CM

## 2012-02-08 DIAGNOSIS — I6522 Occlusion and stenosis of left carotid artery: Secondary | ICD-10-CM

## 2012-02-08 DIAGNOSIS — I6521 Occlusion and stenosis of right carotid artery: Secondary | ICD-10-CM

## 2012-02-09 NOTE — Progress Notes (Signed)
Labs drawn

## 2012-02-19 ENCOUNTER — Telehealth (HOSPITAL_COMMUNITY): Payer: Self-pay | Admitting: *Deleted

## 2012-02-19 ENCOUNTER — Other Ambulatory Visit (HOSPITAL_COMMUNITY): Payer: Self-pay | Admitting: Oncology

## 2012-02-19 ENCOUNTER — Encounter (HOSPITAL_COMMUNITY): Payer: BC Managed Care – PPO

## 2012-02-19 DIAGNOSIS — I6521 Occlusion and stenosis of right carotid artery: Secondary | ICD-10-CM

## 2012-02-19 DIAGNOSIS — I6522 Occlusion and stenosis of left carotid artery: Secondary | ICD-10-CM

## 2012-02-19 LAB — PROTIME-INR
INR: 2.18 — ABNORMAL HIGH (ref 0.00–1.49)
Prothrombin Time: 24.6 seconds — ABNORMAL HIGH (ref 11.6–15.2)

## 2012-02-19 NOTE — Telephone Encounter (Signed)
Message copied by Adelene Amas on Mon Feb 19, 2012  2:31 PM ------      Message from: Ellouise Newer III      Created: Mon Feb 19, 2012 12:05 PM       Same dose            PT/INR in 2 weeks

## 2012-02-19 NOTE — Telephone Encounter (Signed)
Message left for pt to continue same dose coumadin and pt level on 03/01/2012

## 2012-02-29 ENCOUNTER — Other Ambulatory Visit (HOSPITAL_COMMUNITY): Payer: Self-pay | Admitting: Oncology

## 2012-02-29 ENCOUNTER — Encounter (HOSPITAL_BASED_OUTPATIENT_CLINIC_OR_DEPARTMENT_OTHER): Payer: BC Managed Care – PPO

## 2012-02-29 DIAGNOSIS — I82409 Acute embolism and thrombosis of unspecified deep veins of unspecified lower extremity: Secondary | ICD-10-CM

## 2012-02-29 DIAGNOSIS — C50919 Malignant neoplasm of unspecified site of unspecified female breast: Secondary | ICD-10-CM

## 2012-02-29 DIAGNOSIS — I6521 Occlusion and stenosis of right carotid artery: Secondary | ICD-10-CM

## 2012-02-29 DIAGNOSIS — I6522 Occlusion and stenosis of left carotid artery: Secondary | ICD-10-CM

## 2012-02-29 LAB — CBC
HCT: 41.6 % (ref 39.0–52.0)
Hemoglobin: 13.6 g/dL (ref 13.0–17.0)
MCH: 29.8 pg (ref 26.0–34.0)
RBC: 4.57 MIL/uL (ref 4.22–5.81)

## 2012-02-29 LAB — COMPREHENSIVE METABOLIC PANEL
Alkaline Phosphatase: 40 U/L (ref 39–117)
BUN: 16 mg/dL (ref 6–23)
Chloride: 104 mEq/L (ref 96–112)
GFR calc Af Amer: 66 mL/min — ABNORMAL LOW (ref >90)
GFR calc non Af Amer: 57 mL/min — ABNORMAL LOW (ref >90)
Glucose, Bld: 165 mg/dL — ABNORMAL HIGH (ref 70–99)
Potassium: 4.2 mEq/L (ref 3.5–5.1)
Total Bilirubin: 0.4 mg/dL (ref 0.3–1.2)

## 2012-02-29 LAB — DIFFERENTIAL
Lymphs Abs: 2.3 10*3/uL (ref 0.7–4.0)
Monocytes Relative: 7 % (ref 3–12)
Neutro Abs: 5.6 10*3/uL (ref 1.7–7.7)
Neutrophils Relative %: 63 % (ref 43–77)

## 2012-02-29 LAB — PROTIME-INR
INR: 2.85 — ABNORMAL HIGH (ref 0.00–1.49)
Prothrombin Time: 30.4 seconds — ABNORMAL HIGH (ref 11.6–15.2)

## 2012-02-29 NOTE — Progress Notes (Signed)
Cont same dose of coumadin 4/4/5. Return in 1 week for inr. Wife verbalized understanding.

## 2012-03-01 ENCOUNTER — Other Ambulatory Visit (HOSPITAL_COMMUNITY): Payer: BC Managed Care – PPO

## 2012-03-05 ENCOUNTER — Encounter (HOSPITAL_COMMUNITY): Payer: Self-pay | Admitting: Oncology

## 2012-03-05 ENCOUNTER — Encounter (HOSPITAL_BASED_OUTPATIENT_CLINIC_OR_DEPARTMENT_OTHER): Payer: BC Managed Care – PPO | Admitting: Oncology

## 2012-03-05 VITALS — BP 139/78 | HR 86 | Temp 98.9°F | Wt 249.6 lb

## 2012-03-05 DIAGNOSIS — C50919 Malignant neoplasm of unspecified site of unspecified female breast: Secondary | ICD-10-CM

## 2012-03-05 DIAGNOSIS — C50929 Malignant neoplasm of unspecified site of unspecified male breast: Secondary | ICD-10-CM

## 2012-03-05 DIAGNOSIS — Z87898 Personal history of other specified conditions: Secondary | ICD-10-CM

## 2012-03-05 DIAGNOSIS — Z170 Estrogen receptor positive status [ER+]: Secondary | ICD-10-CM

## 2012-03-05 DIAGNOSIS — R21 Rash and other nonspecific skin eruption: Secondary | ICD-10-CM

## 2012-03-05 NOTE — Progress Notes (Signed)
This office note has been dictated.

## 2012-03-05 NOTE — Patient Instructions (Signed)
Ryan [name]  [id number] 31-Aug-1951 Dr. Glenford Peers   [facility]  Discharge Instructions  RECOMMENDATIONS MADE BY THE CONSULTANT AND ANY TEST RESULTS WILL BE SENT TO YOUR REFERRING DOCTOR.   EXAM FINDINGS BY MD TODAY AND SIGNS AND SYMPTOMS TO REPORT TO CLINIC OR PRIMARY MD: Exam and discussion per MD.  Will set up consult with the Dermatologist to see what's going on with the rash on your right upper chest.  Continue the Tamoxifen.  MEDICATIONS PRESCRIBED: none   INSTRUCTIONS GIVEN AND DISCUSSED: Other :  Report any new lumps, bone pain or shortness of breath.  SPECIAL INSTRUCTIONS/FOLLOW-UP: Lab work Needed as scheduled and Return to Clinic to see MD in 4 months.   I acknowledge that I have been informed and understand all the instructions given to me and received a copy. I do not have any more questions at this time, but understand that I may call the Specialty Clinic at [facility] at [phone] during business hours should I have any further questions or need assistance in obtaining follow-up care.    __________________________________________  _____________  __________ Signature of Patient or Authorized Representative            Date                   Time    __________________________________________ Nurse's Signature

## 2012-03-06 ENCOUNTER — Other Ambulatory Visit (HOSPITAL_COMMUNITY): Payer: Self-pay | Admitting: Oncology

## 2012-03-06 ENCOUNTER — Telehealth (HOSPITAL_COMMUNITY): Payer: Self-pay | Admitting: *Deleted

## 2012-03-06 NOTE — Progress Notes (Signed)
DIAGNOSES: 1. Breast cancer, right-sided, status post surgery, now on tamoxifen. 2. History of Hodgkin disease in 1983, treated with radiation therapy     only at that time with a complete remission by Dr. Hennie Duos. 3. Right carotid artery occlusion yielding a stroke in 2007. 4. History of Adie's pupil. 5. Hypercholesterolemia. 6. Left carotid artery partial occlusion. Ryan [name] is doing well with the tamoxifen, though he has developed a rash in the right upper chest above the surgical site which he states is coming and going.  It is warm, red and itchy, so I will get a dermatology consult since this area is about 10 x 8 cm and really is confusing as to what it is.  I do not think it is the tamoxifen, but it certainly could be.  His stage II cancer of the breast, which was grade 1 without LVI, was ER- positive 96%, PR-positive 84%, Ki-67 marker low at 19%, HER-2/neu- negative, and his Oncotype score confirmed the above findings with a recurrence score of only 14, so we gave him tamoxifen, which he will take for 5 years.  His Coumadin is being very nicely controlled.  He is therapeutic today.  We are going to check another one in about 7-10 days to make sure that is still under control before extending his visits out further.  EXAMINATION:  Besides the rash, which is palpably thickened skin, again, red, hot and he states pruritic, right upper chest wall just below the clavicle.  I am not sure it is in the radiation field, but it has come and gone in recent months.  He has no adenopathy.  Lungs:  Clear. Heart:  A regular rhythm and rate without murmur, rub or gallop.  Left breast:  Negative.  Abdomen:  Soft and nontender, without organomegaly. Right chest wall does not look like a recurrence.  This looks like a rash without tumor in my opinion.  He has no peripheral edema.  So we will see him back in a few months.  He will continue the tamoxifen.  We will get an appointment with Dr.  Charlton Haws or Dr. Suan Halter.    ______________________________ Ladona Horns. Mariel Sleet, MD ESN/MEDQ  D:  03/05/2012  T:  03/06/2012  Job:  [id number]

## 2012-03-07 ENCOUNTER — Encounter (HOSPITAL_COMMUNITY): Payer: BC Managed Care – PPO

## 2012-03-07 ENCOUNTER — Other Ambulatory Visit (HOSPITAL_COMMUNITY): Payer: Self-pay | Admitting: Oncology

## 2012-03-07 DIAGNOSIS — I6521 Occlusion and stenosis of right carotid artery: Secondary | ICD-10-CM

## 2012-03-07 DIAGNOSIS — I6522 Occlusion and stenosis of left carotid artery: Secondary | ICD-10-CM

## 2012-03-07 LAB — PROTIME-INR
INR: 2.72 — ABNORMAL HIGH (ref 0.00–1.49)
Prothrombin Time: 29.3 seconds — ABNORMAL HIGH (ref 11.6–15.2)

## 2012-03-07 NOTE — Progress Notes (Signed)
Labs drawn today for pt.  Patient on 4,4,and 5mg alternating.  Call patient at [phone] 

## 2012-03-18 ENCOUNTER — Encounter (HOSPITAL_COMMUNITY): Payer: BC Managed Care – PPO | Attending: Internal Medicine

## 2012-03-18 ENCOUNTER — Other Ambulatory Visit (HOSPITAL_COMMUNITY): Payer: Self-pay | Admitting: Oncology

## 2012-03-18 DIAGNOSIS — I6521 Occlusion and stenosis of right carotid artery: Secondary | ICD-10-CM

## 2012-03-18 DIAGNOSIS — I6522 Occlusion and stenosis of left carotid artery: Secondary | ICD-10-CM

## 2012-03-18 DIAGNOSIS — I6529 Occlusion and stenosis of unspecified carotid artery: Secondary | ICD-10-CM

## 2012-03-18 LAB — PROTIME-INR
INR: 2.66 — ABNORMAL HIGH (ref 0.00–1.49)
Prothrombin Time: 28.8 seconds — ABNORMAL HIGH (ref 11.6–15.2)

## 2012-03-22 ENCOUNTER — Other Ambulatory Visit (HOSPITAL_COMMUNITY): Payer: BC Managed Care – PPO

## 2012-04-01 ENCOUNTER — Encounter (HOSPITAL_BASED_OUTPATIENT_CLINIC_OR_DEPARTMENT_OTHER): Payer: BC Managed Care – PPO

## 2012-04-01 ENCOUNTER — Other Ambulatory Visit (HOSPITAL_COMMUNITY): Payer: Self-pay | Admitting: Oncology

## 2012-04-01 DIAGNOSIS — I6529 Occlusion and stenosis of unspecified carotid artery: Secondary | ICD-10-CM

## 2012-04-01 DIAGNOSIS — I6522 Occlusion and stenosis of left carotid artery: Secondary | ICD-10-CM

## 2012-04-01 DIAGNOSIS — I6521 Occlusion and stenosis of right carotid artery: Secondary | ICD-10-CM

## 2012-04-01 NOTE — Progress Notes (Signed)
Ryan [name] presented for labwork. Labs per MD order drawn via Peripheral Line 23 gauge needle inserted in left AC  Good blood return present. Procedure without incident.  Needle removed intact. Patient tolerated procedure well.

## 2012-04-15 ENCOUNTER — Other Ambulatory Visit (HOSPITAL_COMMUNITY): Payer: Self-pay | Admitting: Oncology

## 2012-04-15 ENCOUNTER — Encounter (HOSPITAL_COMMUNITY): Payer: BC Managed Care – PPO | Attending: Internal Medicine

## 2012-04-15 DIAGNOSIS — I6522 Occlusion and stenosis of left carotid artery: Secondary | ICD-10-CM

## 2012-04-15 DIAGNOSIS — I6521 Occlusion and stenosis of right carotid artery: Secondary | ICD-10-CM

## 2012-04-15 DIAGNOSIS — I6529 Occlusion and stenosis of unspecified carotid artery: Secondary | ICD-10-CM | POA: Insufficient documentation

## 2012-04-15 LAB — PROTIME-INR: Prothrombin Time: 34.7 seconds — ABNORMAL HIGH (ref 11.6–15.2)

## 2012-04-15 NOTE — Progress Notes (Signed)
Labs drawn today for pt.  Patient on 4,4 and 5mg  alternating.  Call patient at [phone]

## 2012-04-22 ENCOUNTER — Encounter (HOSPITAL_BASED_OUTPATIENT_CLINIC_OR_DEPARTMENT_OTHER): Payer: BC Managed Care – PPO

## 2012-04-22 ENCOUNTER — Other Ambulatory Visit (HOSPITAL_COMMUNITY): Payer: Self-pay | Admitting: Oncology

## 2012-04-22 DIAGNOSIS — I6521 Occlusion and stenosis of right carotid artery: Secondary | ICD-10-CM

## 2012-04-22 DIAGNOSIS — Z7901 Long term (current) use of anticoagulants: Secondary | ICD-10-CM

## 2012-04-22 DIAGNOSIS — I6522 Occlusion and stenosis of left carotid artery: Secondary | ICD-10-CM

## 2012-04-22 DIAGNOSIS — I6529 Occlusion and stenosis of unspecified carotid artery: Secondary | ICD-10-CM

## 2012-04-22 NOTE — Progress Notes (Signed)
Labs drawn today for pt.  Patient on 4,4,and 5mg  alternating.  Call patient at [phone]

## 2012-05-06 ENCOUNTER — Other Ambulatory Visit (HOSPITAL_COMMUNITY): Payer: Self-pay | Admitting: Oncology

## 2012-05-06 ENCOUNTER — Encounter (HOSPITAL_BASED_OUTPATIENT_CLINIC_OR_DEPARTMENT_OTHER): Payer: BC Managed Care – PPO

## 2012-05-06 DIAGNOSIS — I6522 Occlusion and stenosis of left carotid artery: Secondary | ICD-10-CM

## 2012-05-06 DIAGNOSIS — I6521 Occlusion and stenosis of right carotid artery: Secondary | ICD-10-CM

## 2012-05-06 DIAGNOSIS — I6529 Occlusion and stenosis of unspecified carotid artery: Secondary | ICD-10-CM

## 2012-05-06 LAB — PROTIME-INR
INR: 2.73 — ABNORMAL HIGH (ref 0.00–1.49)
Prothrombin Time: 29.4 seconds — ABNORMAL HIGH (ref 11.6–15.2)

## 2012-05-06 NOTE — Progress Notes (Signed)
Labs drawn today for pt 

## 2012-05-06 NOTE — Progress Notes (Signed)
Patient in for  PT/INR.  Has been placed on doxycycline 100 mg bid for 14 days due to tick bite.

## 2012-05-27 ENCOUNTER — Other Ambulatory Visit (HOSPITAL_COMMUNITY): Payer: Self-pay | Admitting: Oncology

## 2012-05-27 ENCOUNTER — Encounter (HOSPITAL_COMMUNITY): Payer: BC Managed Care – PPO | Attending: Internal Medicine

## 2012-05-27 DIAGNOSIS — I6522 Occlusion and stenosis of left carotid artery: Secondary | ICD-10-CM

## 2012-05-27 DIAGNOSIS — I6521 Occlusion and stenosis of right carotid artery: Secondary | ICD-10-CM

## 2012-05-27 DIAGNOSIS — I6529 Occlusion and stenosis of unspecified carotid artery: Secondary | ICD-10-CM

## 2012-05-27 LAB — PROTIME-INR: INR: 2.59 — ABNORMAL HIGH (ref 0.00–1.49)

## 2012-05-27 NOTE — Progress Notes (Signed)
Same dose coumadin. Return in 3 weeks.

## 2012-05-27 NOTE — Progress Notes (Signed)
Labs drawn today for pt 

## 2012-06-03 ENCOUNTER — Other Ambulatory Visit (HOSPITAL_COMMUNITY): Payer: Self-pay | Admitting: Oncology

## 2012-06-17 ENCOUNTER — Other Ambulatory Visit (HOSPITAL_COMMUNITY): Payer: Self-pay | Admitting: Oncology

## 2012-06-17 ENCOUNTER — Encounter (HOSPITAL_COMMUNITY): Payer: BC Managed Care – PPO | Attending: Internal Medicine

## 2012-06-17 DIAGNOSIS — I6522 Occlusion and stenosis of left carotid artery: Secondary | ICD-10-CM

## 2012-06-17 DIAGNOSIS — I6521 Occlusion and stenosis of right carotid artery: Secondary | ICD-10-CM

## 2012-06-17 DIAGNOSIS — I6529 Occlusion and stenosis of unspecified carotid artery: Secondary | ICD-10-CM | POA: Insufficient documentation

## 2012-06-17 LAB — PROTIME-INR
INR: 2.46 — ABNORMAL HIGH (ref 0.00–1.49)
Prothrombin Time: 27.1 seconds — ABNORMAL HIGH (ref 11.6–15.2)

## 2012-06-17 NOTE — Progress Notes (Signed)
Labs drawn today for pt 

## 2012-07-01 ENCOUNTER — Encounter (HOSPITAL_BASED_OUTPATIENT_CLINIC_OR_DEPARTMENT_OTHER): Payer: BC Managed Care – PPO | Admitting: Oncology

## 2012-07-01 ENCOUNTER — Encounter (HOSPITAL_COMMUNITY): Payer: Self-pay | Admitting: Oncology

## 2012-07-01 VITALS — BP 127/71 | HR 90 | Temp 96.9°F | Resp 16 | Wt 252.6 lb

## 2012-07-01 DIAGNOSIS — C50929 Malignant neoplasm of unspecified site of unspecified male breast: Secondary | ICD-10-CM

## 2012-07-01 DIAGNOSIS — C50919 Malignant neoplasm of unspecified site of unspecified female breast: Secondary | ICD-10-CM

## 2012-07-01 DIAGNOSIS — C8190 Hodgkin lymphoma, unspecified, unspecified site: Secondary | ICD-10-CM

## 2012-07-01 DIAGNOSIS — Z170 Estrogen receptor positive status [ER+]: Secondary | ICD-10-CM

## 2012-07-01 NOTE — Progress Notes (Signed)
Problem #1 stage II cancer of the right breast, grade 1, no LV I. Estrogen receptor +96%, progesterone separate positivity 4%, Ki-67 marker low of 19% HER-2/neu not amplified. His Oncotype score was 14. He is on tamoxifen 10 mg twice a day which we will continue for 5 full years. He had a right mastectomy in April 2012. Problem #2 Hodgkin's disease in 1983 treated with radiation therapy by Dr. Roe Coombs cone at that time with a complete remission. Problem #3 right carotid artery occlusion unit of stroke in 2007 and we are continuing him on Coumadin during his tamoxifen therapy. Problem #4 left carotid artery partial occlusion Problem #5 obesity he is now weighing 252 pounds Problem #6 deconditioning Problem #7 history of Adie's pupil The patient use to weigh under 200 pounds when he was diagnosed with Hodgkin's disease. He is now 252 pounds and his gained 9 pounds just in the last 6 months or so. He is very in active. What his TV all the time. He is not getting any exercise. He remains free of B. symptomatology. He is not wearing lumps on his chest wall or anywhere else. His rash in the right upper chest wall went away. The sun-induced according to him.  Physical exam shows stable vital signs other than his weight. Right chest wall is clear. No lymphadenopathy. Lungs are clear. Heart shows a regular rhythm and rate without murmur rub or gallop. Abdomen is soft and nontender without obvious or get a megaly. He has no arm or leg edema. Bowel sounds are normal. We will see him in 6 months. You'll continue the tamoxifen and Coumadin and the Coumadin we'll continue as well as is on the tamoxifen. When he is finished with the tamoxifen he can stop the Coumadin and return to Aggrenox therapy.

## 2012-07-01 NOTE — Patient Instructions (Addendum)
[facility]  Discharge Instructions  RECOMMENDATIONS MADE BY THE CONSULTANT AND ANY TEST RESULTS WILL BE SENT TO YOUR REFERRING DOCTOR.   EXAM FINDINGS BY MD TODAY AND SIGNS AND SYMPTOMS TO REPORT TO CLINIC OR PRIMARY MD: Exam and discussion per MD. No evidence of recurrence by exam. You are doing well.  Try to lose some weight.  Decrease you food intake and increase your activity.    MEDICATIONS PRESCRIBED: none   INSTRUCTIONS GIVEN AND DISCUSSED: Other :  Report any new lumps, bone pain or shortness of breath.  SPECIAL INSTRUCTIONS/FOLLOW-UP: Lab work Needed in October, [place] Studies Needed CT of chest in October and Return to Clinic in 6 months for follow-up.   I acknowledge that I have been informed and understand all the instructions given to me and received a copy. I do not have any more questions at this time, but understand that I may call the Specialty Clinic at [facility] at [phone] during business hours should I have any further questions or need assistance in obtaining follow-up care.    __________________________________________  _____________  __________ Signature of Patient or Authorized Representative            Date                   Time    __________________________________________ Nurse's Signature

## 2012-07-15 ENCOUNTER — Other Ambulatory Visit (HOSPITAL_COMMUNITY): Payer: Self-pay | Admitting: Oncology

## 2012-07-15 ENCOUNTER — Encounter (HOSPITAL_COMMUNITY): Payer: BC Managed Care – PPO | Attending: Internal Medicine

## 2012-07-15 DIAGNOSIS — I6522 Occlusion and stenosis of left carotid artery: Secondary | ICD-10-CM

## 2012-07-15 DIAGNOSIS — I6521 Occlusion and stenosis of right carotid artery: Secondary | ICD-10-CM

## 2012-07-15 DIAGNOSIS — C50929 Malignant neoplasm of unspecified site of unspecified male breast: Secondary | ICD-10-CM

## 2012-07-15 DIAGNOSIS — I6529 Occlusion and stenosis of unspecified carotid artery: Secondary | ICD-10-CM | POA: Insufficient documentation

## 2012-07-15 LAB — PROTIME-INR: Prothrombin Time: 22 seconds — ABNORMAL HIGH (ref 11.6–15.2)

## 2012-07-15 NOTE — Progress Notes (Signed)
Labs drawn today for pt 

## 2012-07-29 ENCOUNTER — Ambulatory Visit (HOSPITAL_COMMUNITY)
Admission: RE | Admit: 2012-07-29 | Discharge: 2012-07-29 | Disposition: A | Discharge status: Home / Self Care | Payer: BC Managed Care – PPO | Source: Ambulatory Visit | Attending: Oncology | Admitting: Oncology

## 2012-07-29 DIAGNOSIS — R911 Solitary pulmonary nodule: Secondary | ICD-10-CM | POA: Insufficient documentation

## 2012-07-29 DIAGNOSIS — Z853 Personal history of malignant neoplasm of breast: Secondary | ICD-10-CM | POA: Insufficient documentation

## 2012-07-30 ENCOUNTER — Other Ambulatory Visit (HOSPITAL_COMMUNITY): Payer: Self-pay | Admitting: Oncology

## 2012-07-30 DIAGNOSIS — R918 Other nonspecific abnormal finding of lung field: Secondary | ICD-10-CM

## 2012-07-30 DIAGNOSIS — C50919 Malignant neoplasm of unspecified site of unspecified female breast: Secondary | ICD-10-CM

## 2012-08-12 ENCOUNTER — Encounter (HOSPITAL_COMMUNITY): Payer: BC Managed Care – PPO | Attending: Internal Medicine

## 2012-08-12 DIAGNOSIS — I6529 Occlusion and stenosis of unspecified carotid artery: Secondary | ICD-10-CM | POA: Insufficient documentation

## 2012-08-12 DIAGNOSIS — I6521 Occlusion and stenosis of right carotid artery: Secondary | ICD-10-CM

## 2012-08-12 DIAGNOSIS — I6522 Occlusion and stenosis of left carotid artery: Secondary | ICD-10-CM

## 2012-08-12 LAB — PROTIME-INR: Prothrombin Time: 24.7 seconds — ABNORMAL HIGH (ref 11.6–15.2)

## 2012-08-12 NOTE — Progress Notes (Signed)
Labs drawn today for pt 

## 2012-09-09 ENCOUNTER — Other Ambulatory Visit (HOSPITAL_COMMUNITY): Payer: Self-pay | Admitting: Oncology

## 2012-09-09 ENCOUNTER — Encounter (HOSPITAL_COMMUNITY): Payer: BC Managed Care – PPO | Attending: Internal Medicine

## 2012-09-09 DIAGNOSIS — I6529 Occlusion and stenosis of unspecified carotid artery: Secondary | ICD-10-CM | POA: Insufficient documentation

## 2012-09-09 DIAGNOSIS — I6521 Occlusion and stenosis of right carotid artery: Secondary | ICD-10-CM

## 2012-09-09 DIAGNOSIS — I6522 Occlusion and stenosis of left carotid artery: Secondary | ICD-10-CM

## 2012-09-09 NOTE — Progress Notes (Signed)
Labs drawn today for pt 

## 2012-09-24 ENCOUNTER — Encounter (HOSPITAL_COMMUNITY): Payer: BC Managed Care – PPO

## 2012-09-24 DIAGNOSIS — I6521 Occlusion and stenosis of right carotid artery: Secondary | ICD-10-CM

## 2012-09-24 DIAGNOSIS — I6522 Occlusion and stenosis of left carotid artery: Secondary | ICD-10-CM

## 2012-09-24 NOTE — Progress Notes (Signed)
Labs drawn today for pt 

## 2012-09-27 IMAGING — US US BIOPSY
1 series · 9 of 9 positions shown · non-contrast
Comparison: none

***ADDENDUM*** CREATED: 12/16/2010 [DATE]

Pathology shows invasive ductal carcinoma, correlating well with
the imaging appearance.
At the request of the patient, I spoke with an by telephone.  He
reports doing well after biopsy.  The patient has seen Dr.
Aujla in the past for a history of non-Hodgkins lymphoma (the
patient has a history of chest radiation).  Surgical consultation
will be arranged for the patient with Dr. Kilego at the request
of patient.  Left mammogram will be scheduled.
CLINICAL DATA: Suspicious right breast mass

[Series 1: us biopsy · 9 acquisitions, 9 frames shown]
[im 1/9]
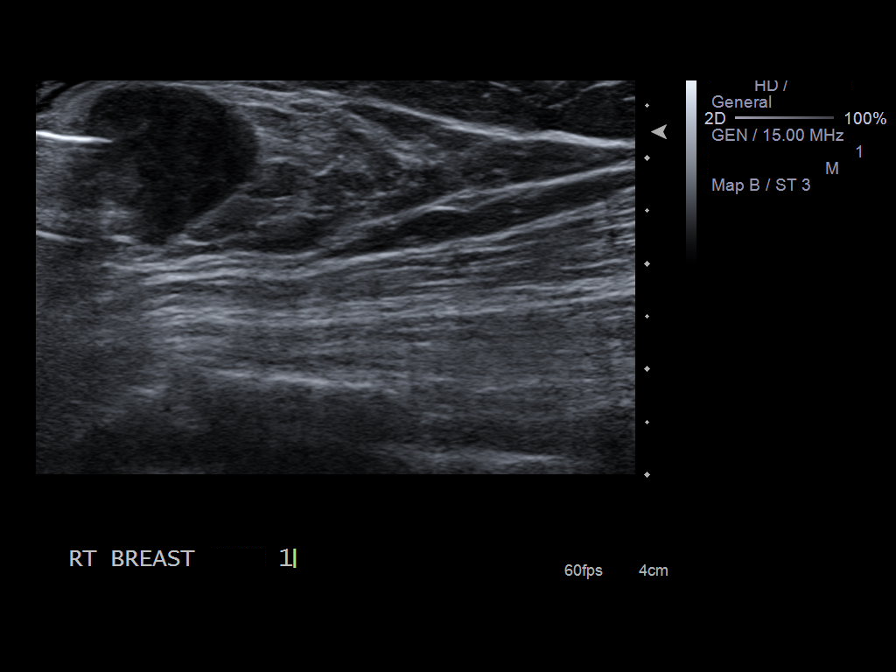
[im 2/9]
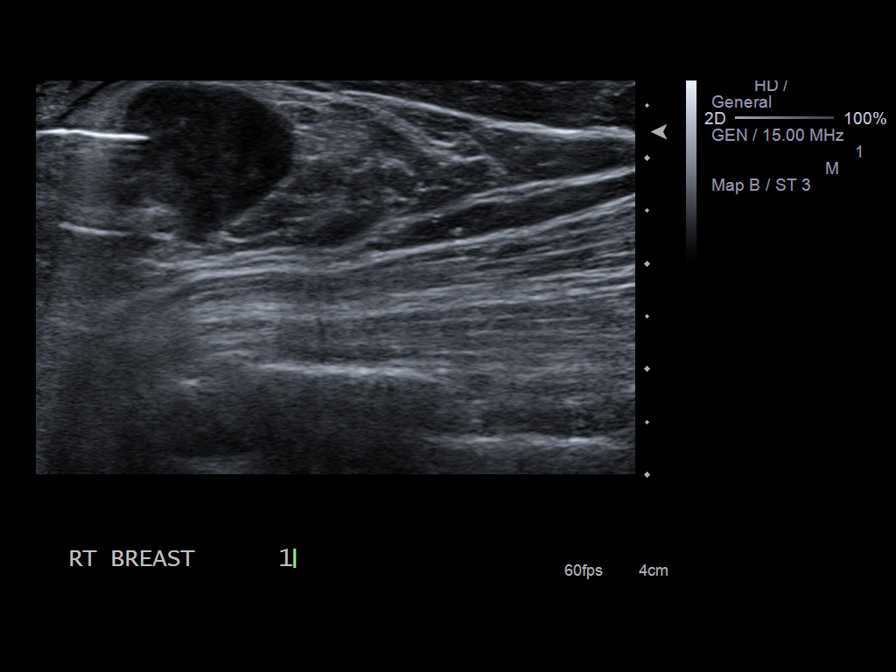
[im 3/9]
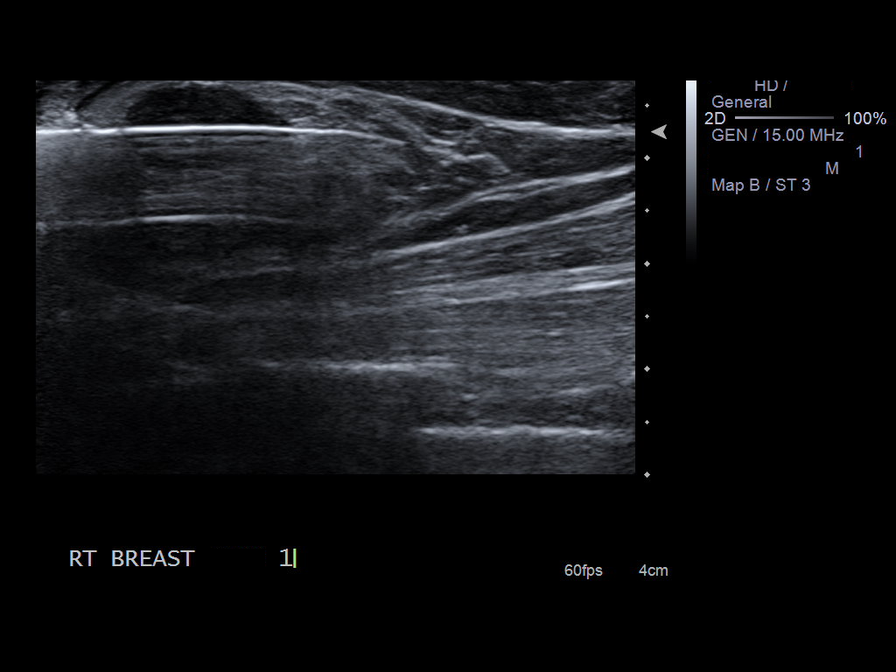
[im 4/9]
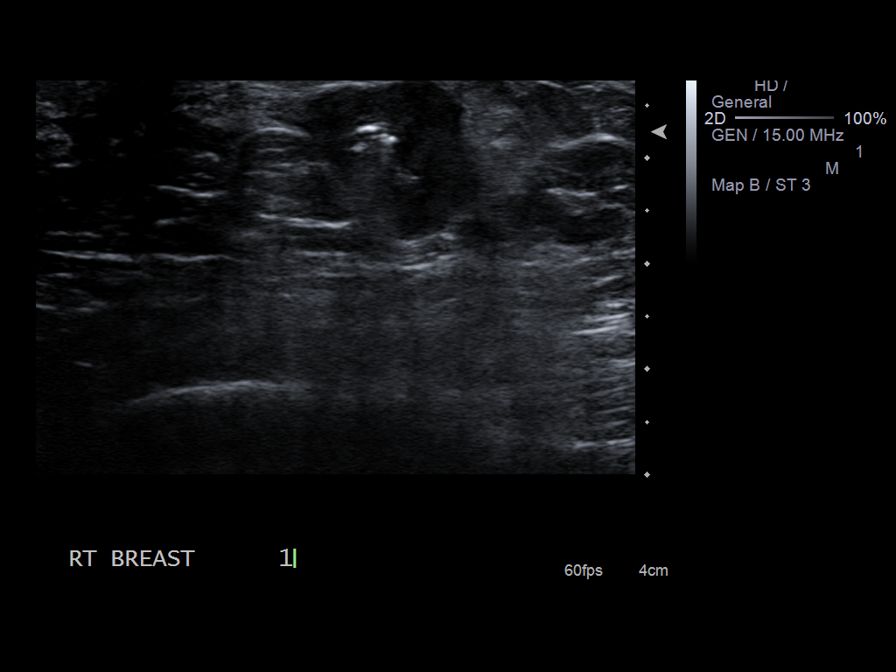
[im 5/9]
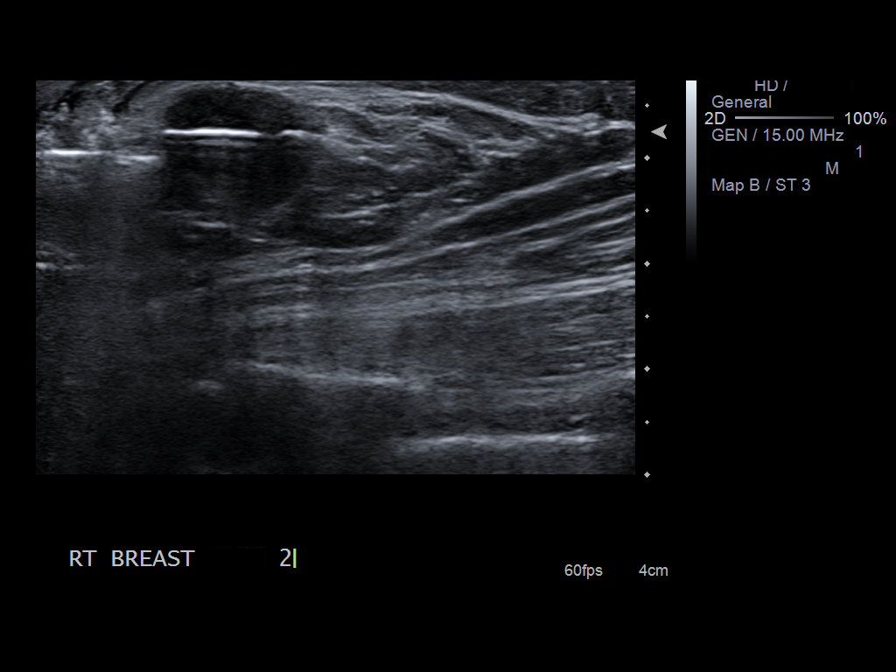
[im 6/9]
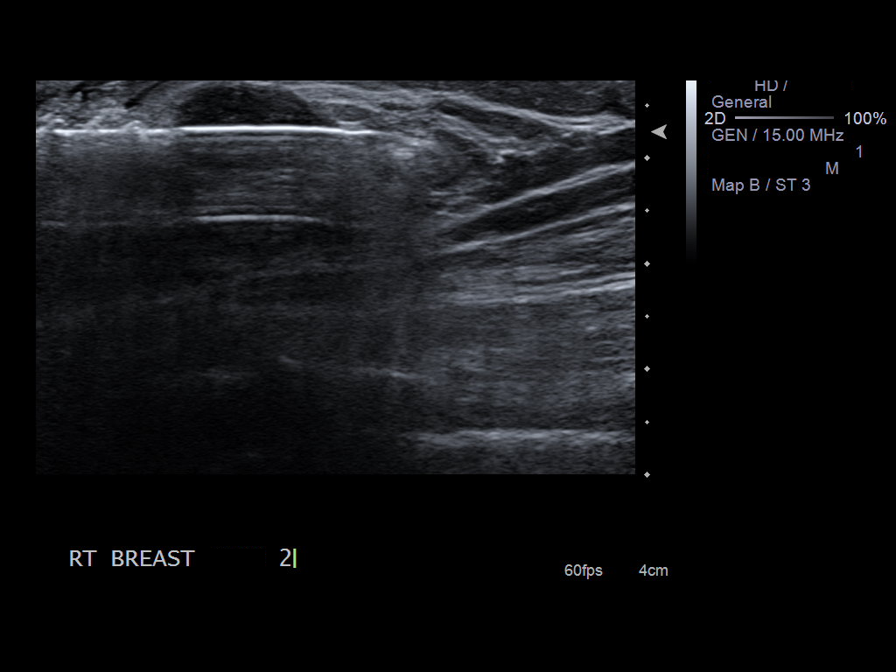
[im 7/9]
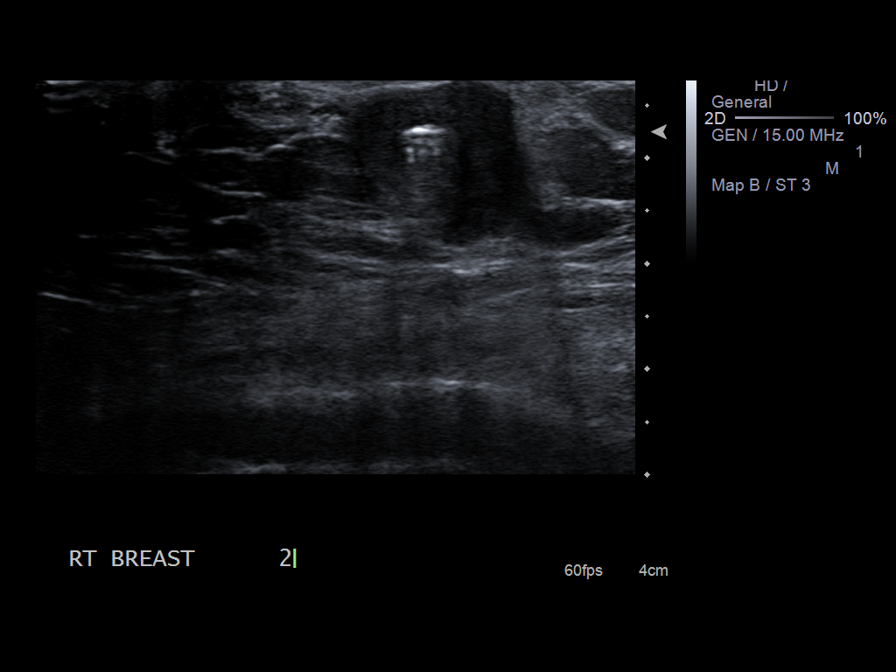
[im 8/9]
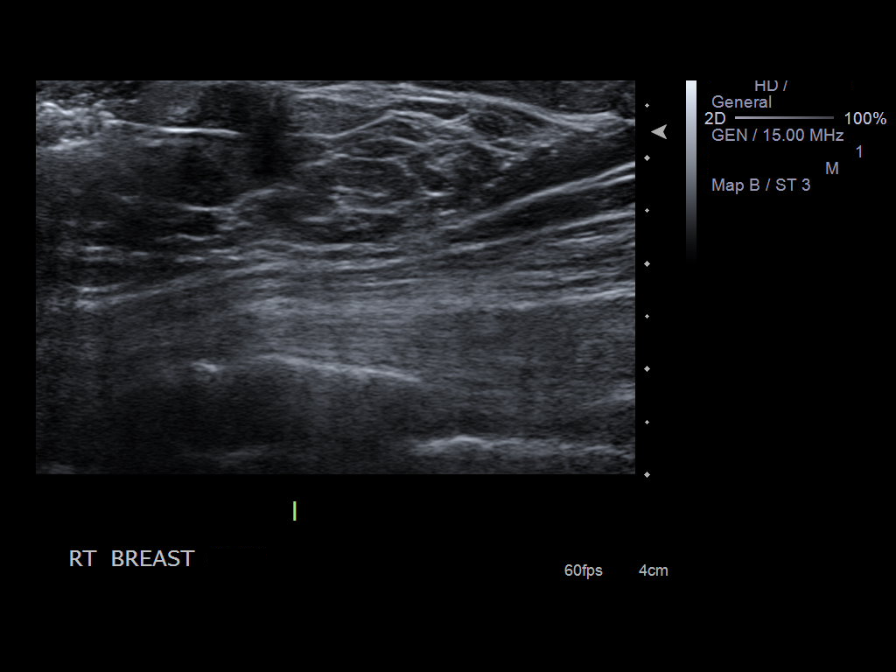
[im 9/9]
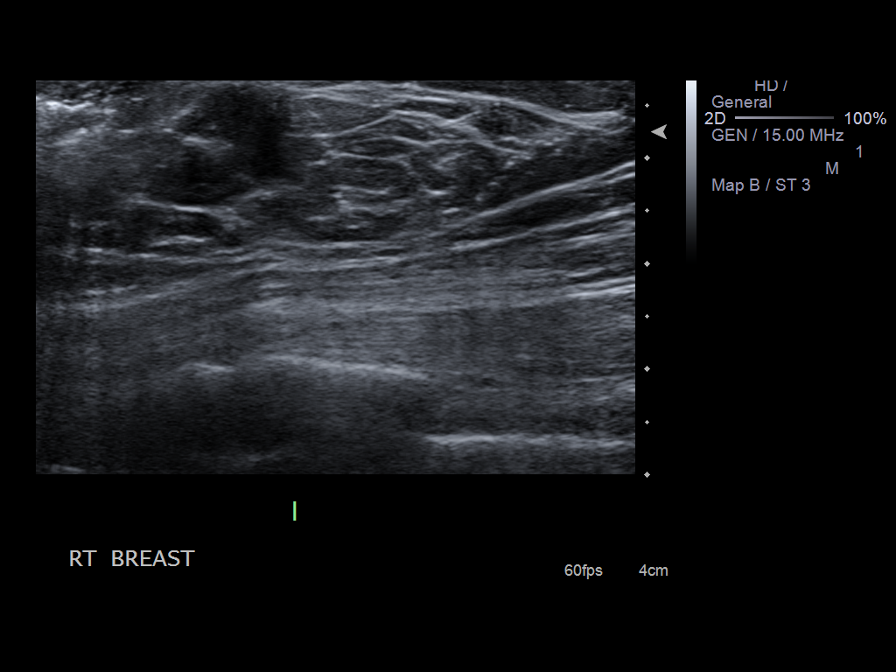

[9 of 9 positions shown; findings below may reference images not displayed]

ULTRASOUND GUIDED VACUUM ASSISTED CORE BIOPSY OF THE RIGHT BREAST

I met with the patient, and we discussed the procedure of
ultrasound-guided biopsy, including risks, benefits, and
alternatives.  Specifically, we discussed the risks of infection,
bleeding, tissue injury, clip migration, and inadequate sampling.
Informed, written consent was given.

Using sterile technique, 2% lidocaine, ultrasound guidance, and a
12 gauge vacuum assisted needle, biopsy was performed of a mass in
the 9 o'clock region of the right breast.  At the conclusion of the
procedure, a tissue marker clip was deployed into the biopsy
cavity.  Follow-up 2-view mammogram was performed and dictated
separately.
IMPRESSION: Ultrasound-guided biopsy of the right breast.  No apparent
complications.

## 2012-09-27 IMAGING — MG MM DIGITAL DIAGNOSTIC UNILAT R
2 series · 2 of 2 positions shown · non-contrast
Comparison: none

CLINICAL DATA: Status post ultrasound-guided core biopsy of a
right breast mass

DIGITAL DIAGNOSTIC RIGHT MAMMOGRAM

[R CC]
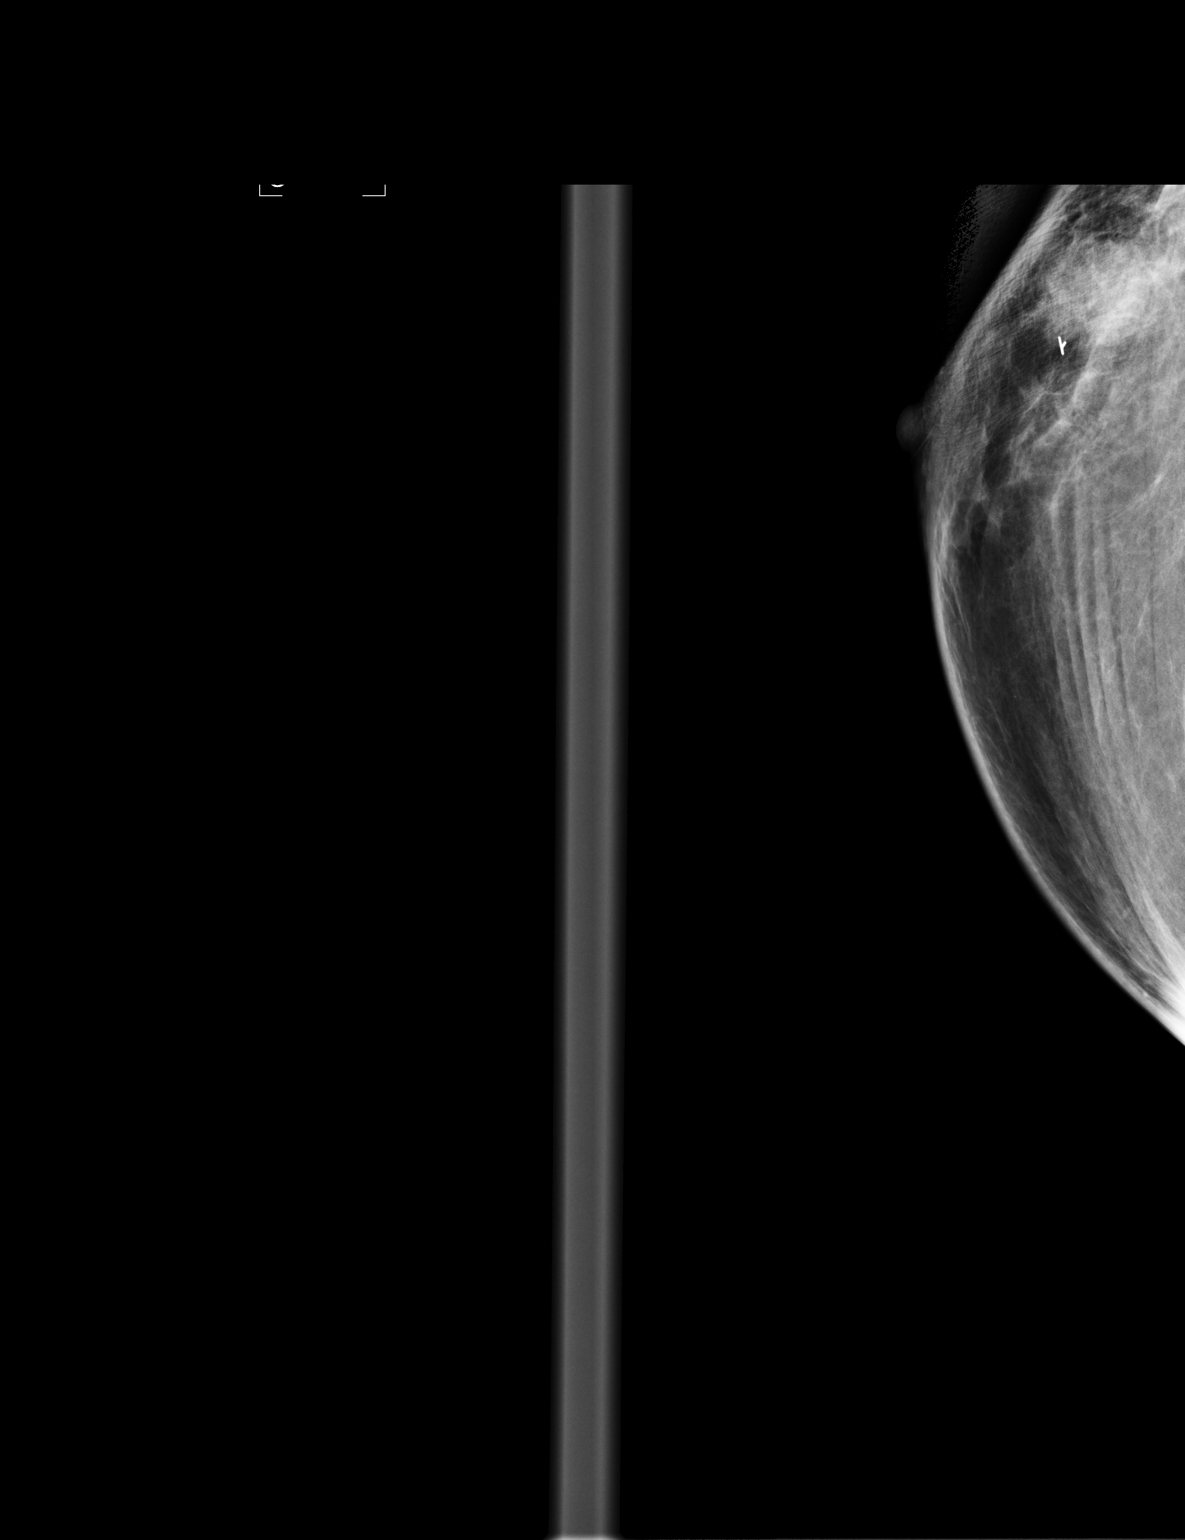

[R ML]
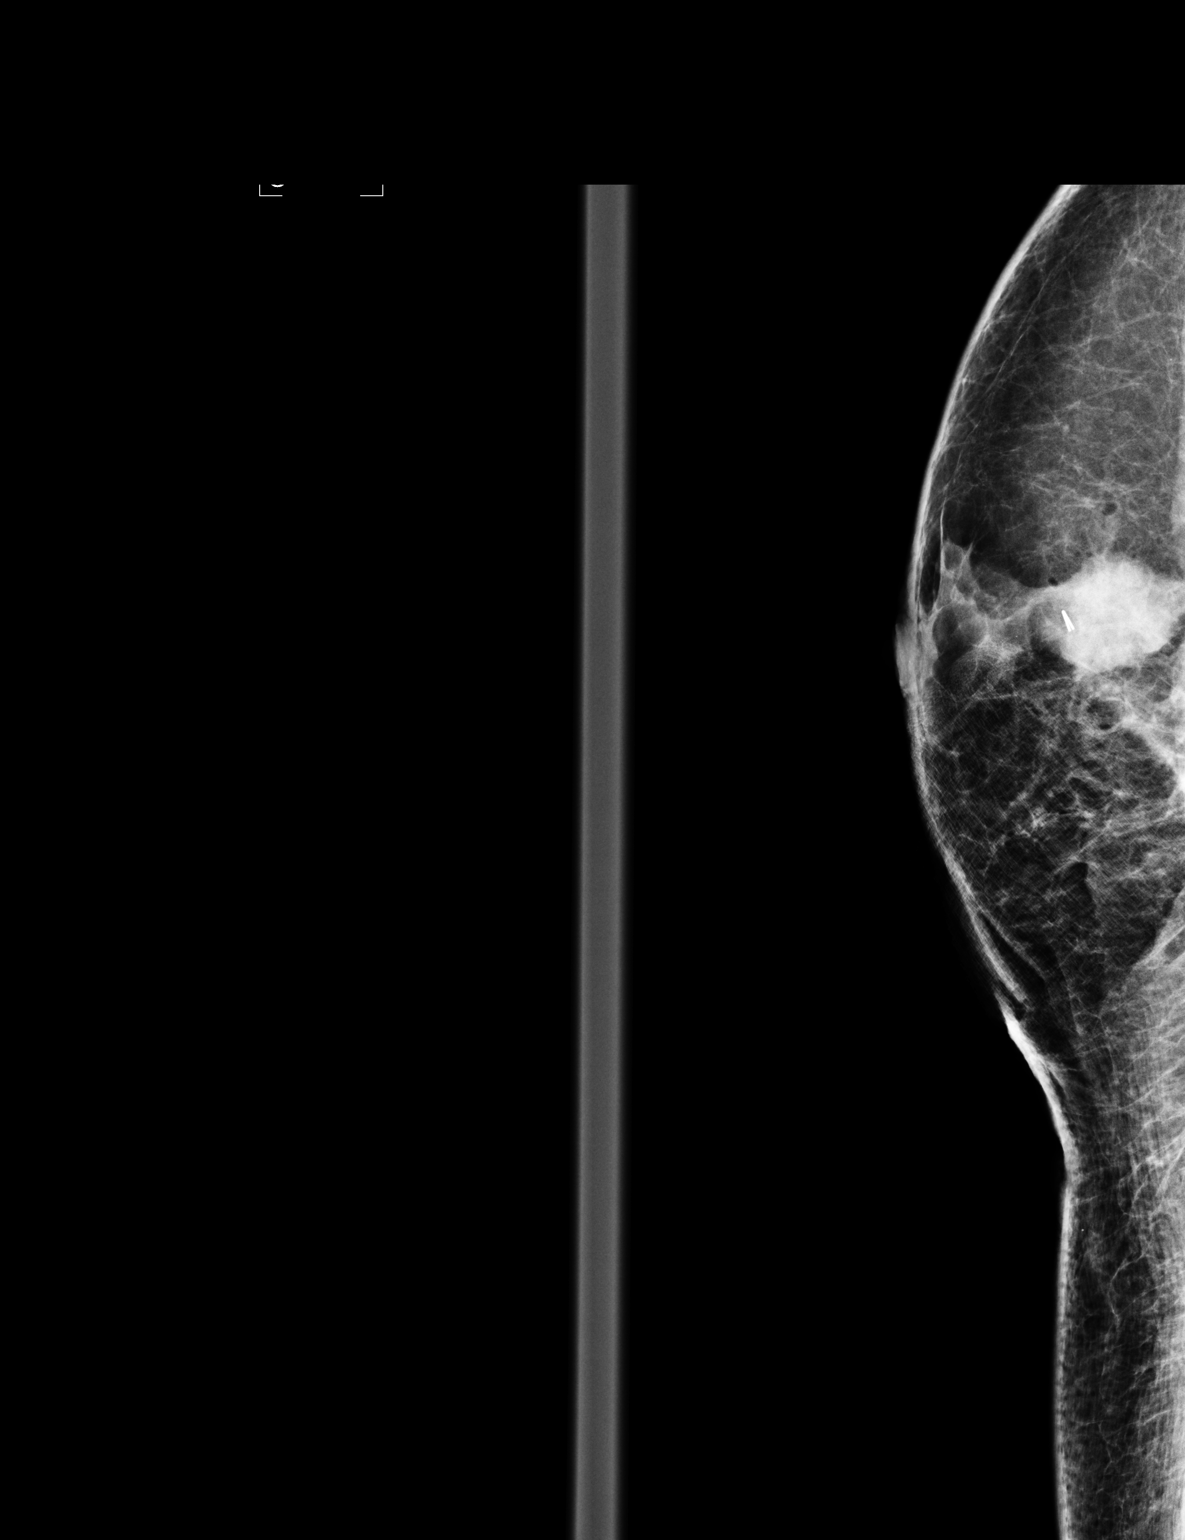

[2 of 2 positions shown; findings below may reference images not displayed]

FINDINGS: Films are performed following ultrasound guided biopsy
of a mass in the 9 o'clock region of the right breast.
Mammographic images demonstrate there is a ribbon shaped Inrad clip
associated with the suspicious mass.
IMPRESSION: Status post ultrasound-guided core biopsy of the right breast with
pathology pending.

## 2012-10-03 ENCOUNTER — Other Ambulatory Visit (HOSPITAL_COMMUNITY): Payer: Self-pay | Admitting: Oncology

## 2012-10-04 ENCOUNTER — Other Ambulatory Visit (HOSPITAL_COMMUNITY): Payer: Self-pay | Admitting: Family Medicine

## 2012-10-04 DIAGNOSIS — Z8673 Personal history of transient ischemic attack (TIA), and cerebral infarction without residual deficits: Secondary | ICD-10-CM

## 2012-10-07 ENCOUNTER — Ambulatory Visit (HOSPITAL_COMMUNITY)
Admission: RE | Admit: 2012-10-07 | Discharge: 2012-10-07 | Disposition: A | Discharge status: Home / Self Care | Payer: BC Managed Care – PPO | Source: Ambulatory Visit | Attending: Family Medicine | Admitting: Family Medicine

## 2012-10-07 DIAGNOSIS — Z8673 Personal history of transient ischemic attack (TIA), and cerebral infarction without residual deficits: Secondary | ICD-10-CM

## 2012-10-07 DIAGNOSIS — I63239 Cerebral infarction due to unspecified occlusion or stenosis of unspecified carotid arteries: Secondary | ICD-10-CM | POA: Insufficient documentation

## 2012-10-09 HISTORY — PX: CARDIAC CATHETERIZATION: SHX172

## 2012-10-14 ENCOUNTER — Other Ambulatory Visit: Payer: Self-pay

## 2012-10-14 DIAGNOSIS — I6529 Occlusion and stenosis of unspecified carotid artery: Secondary | ICD-10-CM

## 2012-10-15 ENCOUNTER — Encounter (HOSPITAL_COMMUNITY): Payer: BC Managed Care – PPO | Attending: Internal Medicine

## 2012-10-15 DIAGNOSIS — I6529 Occlusion and stenosis of unspecified carotid artery: Secondary | ICD-10-CM | POA: Insufficient documentation

## 2012-10-15 LAB — PROTIME-INR
INR: 2.39 — ABNORMAL HIGH (ref 0.00–1.49)
Prothrombin Time: 25 seconds — ABNORMAL HIGH (ref 11.6–15.2)

## 2012-10-15 NOTE — Progress Notes (Signed)
Labs drawn today for pt 

## 2012-11-05 ENCOUNTER — Encounter: Payer: Self-pay | Admitting: Vascular Surgery

## 2012-11-06 ENCOUNTER — Other Ambulatory Visit (INDEPENDENT_AMBULATORY_CARE_PROVIDER_SITE_OTHER): Payer: BC Managed Care – PPO | Admitting: Vascular Surgery

## 2012-11-06 ENCOUNTER — Encounter: Payer: BC Managed Care – PPO | Admitting: Vascular Surgery

## 2012-11-06 ENCOUNTER — Ambulatory Visit (INDEPENDENT_AMBULATORY_CARE_PROVIDER_SITE_OTHER): Payer: BC Managed Care – PPO | Admitting: Vascular Surgery

## 2012-11-06 ENCOUNTER — Other Ambulatory Visit: Payer: BC Managed Care – PPO

## 2012-11-06 ENCOUNTER — Encounter: Payer: Self-pay | Admitting: Vascular Surgery

## 2012-11-06 VITALS — BP 178/85 | HR 85 | Resp 16 | Ht 75.0 in | Wt 251.0 lb

## 2012-11-06 DIAGNOSIS — I6529 Occlusion and stenosis of unspecified carotid artery: Secondary | ICD-10-CM

## 2012-11-06 NOTE — Assessment & Plan Note (Signed)
This patient has a known right internal carotid artery occlusion. He has an asymptomatic 40-59% left carotid stenosis. He understands we would not consider left carotid endarterectomy was the stenosis progressed to greater than 80% or he developed new neurologic symptoms. Fortunately he is not a smoker. He is on Lipitor and his blood pressure and cholesterol been well controlled. I have recommended a follow up duplex scan in 1 year and I'll see him back at that time. He knows to call sooner for has problems. In the meantime he knows to continue taking his aspirin.

## 2012-11-06 NOTE — Progress Notes (Signed)
[facility] [place]  Patient name: Ryan [name] MRN: [id number] DOB: 01-08-1951 Sex: male  REASON FOR CONSULT: carotid disease. Referred by Dr. Regino Schultze  HPI: Ryan [name] is a 62 y.o. male who apparently had seen back in 2007 with an occluded right internal carotid artery. I do not have his old records. He had plan on following a moderate left carotid stenosis but he was lost to follow up. He was sent back for carotid evaluation. Of note he is right-handed. In 2007, he had a right hemispheric stroke associated with left-sided weakness. He was found to have an occluded right internal carotid artery. Since that time is done well. He denies any focal weakness or paresthesias. He denies any expressive or receptive aphasia. He denies amaurosis fugax. He does state that occasionally he has episodes where he feels weak and the knees and lightheaded and this improved rapidly if he eats something. The cephalic episodes where he might be getting hypoglycemic.  Of note, he was treated for a breast cancer on the right 2012 with a lymph node dissection. This was for a stage II invasive ductal carcinoma of the breast.  Past Medical History  Diagnosis Date  . Invasive ductal carcinoma of breast, stage 2 04/27/2011  . Right carotid artery occlusion 04/27/2011  . Left carotid artery partial occlusion 04/27/2011  . Stroke     2007  . Hypercholesterolemia   . Adie's pupil   . H/O Hodgkin's disease 04/27/2011  . Breast cancer   . Cancer     hx of hodgkins disease  . Thyroid disease     Family History  Problem Relation Age of Onset  . Alzheimer's disease Mother   . Stroke Mother     SOCIAL HISTORY: History  Substance Use Topics  . Smoking status: Former Smoker    Types: Cigarettes    Quit date: 10/09/1981  . Smokeless tobacco: Never Used  . Alcohol Use: No    No Known Allergies  Current Outpatient Prescriptions  Medication Sig Dispense Refill  . albuterol  (PROVENTIL) 2 MG tablet Take 2 mg by mouth as needed.       . ALPRAZolam (XANAX) 0.5 MG tablet Take 0.5 mg by mouth daily.        Marland Kitchen aspirin EC 81 MG tablet Take 81 mg by mouth daily.        . citalopram (CELEXA) 40 MG tablet Take 40 mg by mouth daily.        . Garlic 500 MG CAPS Take 500 mg by mouth daily.      . Ginkgo Biloba 60 MG CAPS Take 60 mg by mouth daily.        Marland Kitchen levothyroxine (SYNTHROID, LEVOTHROID) 50 MCG tablet Take 50 mcg by mouth daily.      . Omega-3 Fatty Acids (FISH OIL) 1000 MG CPDR Take 1 each by mouth daily.      . simvastatin (ZOCOR) 40 MG tablet Take 40 mg by mouth at bedtime.        . tamoxifen (NOLVADEX) 10 MG tablet TAKE ONE TABLET BY MOUTH TWICE DAILY  60 tablet  5  . warfarin (COUMADIN) 2 MG tablet Taking 4mg , 4mg , 5mg .      . warfarin (COUMADIN) 5 MG tablet Taking 4mg , 4mg , 5mg .        REVIEW OF SYSTEMS: Arly.Keller ] denotes positive finding; [  ] denotes negative finding  CARDIOVASCULAR:  [ ]  chest pain   Arly.Keller ] chest pressure   [ ]   palpitations   Arly.Keller ] orthopnea   Arly.Keller ] dyspnea on exertion   [ ]  claudication   [ ]  rest pain   [ ]  DVT   [ ]  phlebitis PULMONARY:   Arly.Keller ] productive cough   Arly.Keller ] asthma   [ ]  wheezing NEUROLOGIC:   Arly.Keller ] weakness bilat  [ ]  paresthesias  [ ]  aphasia  [ ]  amaurosis  Arly.Keller ] dizziness HEMATOLOGIC:   [ ]  bleeding problems   [ ]  clotting disorders MUSCULOSKELETAL:  [ ]  joint pain   [ ]  joint swelling [ ]  leg swelling GASTROINTESTINAL: [ ]   blood in stool  [ ]   hematemesis GENITOURINARY:  [ ]   dysuria  [ ]   hematuria PSYCHIATRIC:  Arly.Keller ] history of major depression INTEGUMENTARY:  [ ]  rashes  [ ]  ulcers CONSTITUTIONAL:  [ ]  fever   [ ]  chills  PHYSICAL EXAM: Filed Vitals:   11/06/12 1052  BP: 178/85  Pulse: 85  Resp: 16  Height: 6\' 3"  (1.905 m)  Weight: 251 lb (113.853 kg)  SpO2: 97%   Body mass index is 31.37 kg/(m^2). GENERAL: The patient is a well-nourished male, in no acute distress. The vital signs are documented above. CARDIOVASCULAR:  There is a regular rate and rhythm. I do not detect carotid bruits. He has palpable dorsalis pedis and posterior tibial pulses bilaterally. He has no significant lower extremity swelling. PULMONARY: There is good air exchange bilaterally without wheezing or rales. ABDOMEN: Soft and non-tender with normal pitched bowel sounds. I do not palpate an abdominal aortic aneurysm although his abdomen is somewhat difficult to assess because of his size. MUSCULOSKELETAL: There are no major deformities or cyanosis. NEUROLOGIC: No focal weakness or paresthesias are detected. SKIN: There are no ulcers or rashes noted. PSYCHIATRIC: The patient has a normal affect.  DATA:  I have independently interpreted his carotid duplex scan which shows an occluded right internal carotid artery and a 40-59% left carotid stenosis. This is likely in the lower end of that range. Both vertebral arteries are patent with normally directed flow.  I've also reviewed his records from Dr. Edison Simon office. He has a history of anxiety and hypothyroidism.  MEDICAL ISSUES:  Occlusion and stenosis of carotid artery without mention of cerebral infarction This patient has a known right internal carotid artery occlusion. He has an asymptomatic 40-59% left carotid stenosis. He understands we would not consider left carotid endarterectomy was the stenosis progressed to greater than 80% or he developed new neurologic symptoms. Fortunately he is not a smoker. He is on Lipitor and his blood pressure and cholesterol been well controlled. I have recommended a follow up duplex scan in 1 year and I'll see him back at that time. He knows to call sooner for has problems. In the meantime he knows to continue taking his aspirin.   DICKSON,CHRISTOPHER S [facility] [place] Beeper: [phone]

## 2012-11-06 NOTE — Addendum Note (Signed)
Addended by: Melodye Ped C on: 11/06/2012 01:18 PM   Modules accepted: Orders

## 2012-11-12 ENCOUNTER — Encounter (HOSPITAL_COMMUNITY): Payer: BC Managed Care – PPO | Attending: Internal Medicine

## 2012-11-12 DIAGNOSIS — I6529 Occlusion and stenosis of unspecified carotid artery: Secondary | ICD-10-CM | POA: Insufficient documentation

## 2012-11-12 NOTE — Progress Notes (Signed)
Labs drawn today for pt 

## 2012-12-10 ENCOUNTER — Other Ambulatory Visit (HOSPITAL_COMMUNITY): Payer: Self-pay | Admitting: Oncology

## 2012-12-10 ENCOUNTER — Encounter (HOSPITAL_COMMUNITY): Payer: BC Managed Care – PPO | Attending: Internal Medicine

## 2012-12-10 DIAGNOSIS — Z7901 Long term (current) use of anticoagulants: Secondary | ICD-10-CM | POA: Insufficient documentation

## 2012-12-10 DIAGNOSIS — I6529 Occlusion and stenosis of unspecified carotid artery: Secondary | ICD-10-CM | POA: Insufficient documentation

## 2012-12-10 DIAGNOSIS — C50929 Malignant neoplasm of unspecified site of unspecified male breast: Secondary | ICD-10-CM | POA: Insufficient documentation

## 2012-12-10 LAB — PROTIME-INR: Prothrombin Time: 26.3 seconds — ABNORMAL HIGH (ref 11.6–15.2)

## 2012-12-10 NOTE — Progress Notes (Signed)
Labs drawn today for pt 

## 2012-12-30 ENCOUNTER — Encounter (HOSPITAL_COMMUNITY): Payer: Self-pay | Admitting: Oncology

## 2012-12-30 ENCOUNTER — Encounter (HOSPITAL_BASED_OUTPATIENT_CLINIC_OR_DEPARTMENT_OTHER): Payer: BC Managed Care – PPO | Admitting: Oncology

## 2012-12-30 VITALS — BP 156/96 | HR 84 | Temp 97.0°F | Resp 18 | Wt 249.5 lb

## 2012-12-30 DIAGNOSIS — C50911 Malignant neoplasm of unspecified site of right female breast: Secondary | ICD-10-CM

## 2012-12-30 DIAGNOSIS — C8190 Hodgkin lymphoma, unspecified, unspecified site: Secondary | ICD-10-CM

## 2012-12-30 DIAGNOSIS — C50929 Malignant neoplasm of unspecified site of unspecified male breast: Secondary | ICD-10-CM

## 2012-12-30 DIAGNOSIS — I6529 Occlusion and stenosis of unspecified carotid artery: Secondary | ICD-10-CM

## 2012-12-30 NOTE — Patient Instructions (Addendum)
[facility] Discharge Instructions  RECOMMENDATIONS MADE BY THE CONSULTANT AND ANY TEST RESULTS WILL BE SENT TO YOUR REFERRING PHYSICIAN.  EXAM FINDINGS BY THE PHYSICIAN TODAY AND SIGNS OR SYMPTOMS TO REPORT TO CLINIC OR PRIMARY PHYSICIAN: Exam and discussion by MD.  Bonita Quin are doing well.  MEDICATIONS PRESCRIBED:  none  INSTRUCTIONS GIVEN AND DISCUSSED: Report any new lumps, bone pain or shortness of breath.   SPECIAL INSTRUCTIONS/FOLLOW-UP: Blood work in October and CT of chest also in October and to see PA in 6 months.  Thank you for choosing [facility] to provide your oncology and hematology care.  To afford each patient quality time with our providers, please arrive at least 15 minutes before your scheduled appointment time.  With your help, our goal is to use those 15 minutes to complete the necessary work-up to ensure our physicians have the information they need to help with your evaluation and healthcare recommendations.    Effective January 1st, 2014, we ask that you re-schedule your appointment with our physicians should you arrive 10 or more minutes late for your appointment.  We strive to give you quality time with our providers, and arriving late affects you and other patients whose appointments are after yours.    Again, thank you for choosing [facility].  Our hope is that these requests will decrease the amount of time that you wait before being seen by our physicians.       _____________________________________________________________  Should you have questions after your visit to [facility], please contact our office at [phone] between the hours of 8:30 a.m. and 5:00 p.m.  Voicemails left after 4:30 p.m. will not be returned until the following business day.  For prescription refill requests, have your pharmacy contact our office with your prescription refill request.

## 2012-12-30 NOTE — Progress Notes (Signed)
#  1 stage II cancer the right breast, grade 1, without LV I. ER +96%, PR 4%, Ki-67 marker low at 19%, HER-2/neu nonamplified. Oncotype score was 14 they're fully placed on tamoxifen 10 mg twice a day which she'll take for 5 full years. He is status post right mastectomy in April 2012 at which time he was found to have a 2.1 cm primary, 3 sentinel nodes were negative #2 Hodgkin's disease in 1983. Radiation therapy with a complete remission at that time. #3 right carotid artery occlusion with small stroke in 2007 and he is on Coumadin uptake in the tamoxifen. #4 left carotid artery partial occlusion followed on a regular basis by his vascular surgeon #5 obesity weighing 249-252 pounds now. #6 deconditioning #7 history of Adie's pupil  He is here today with his wife. He is very sedentary. He gives a was no exercise. He still has no B. symptoms. He also is not wearing lumps on his chest wall underneath his arms et Karie Soda.  He is not in any acute distress however. He is up-to-date on his colonoscopies but may need one in the next 2 years.  His physical exam shows stable vital signs. He has no lymphadenopathy in any location including cervical, supraclavicular, interclavicular, axillary, epitrochlear or inguinal areas. He does have a small peri-umbilical hernia That is easily reducible. It is approximately 10-14 mm across. He has no hepatosplenomegaly that I can appreciate. Bowel sounds are diminished. Lungs are clear. Right chest wall is clear left breast is negative for any masses. Heart shows a regular rhythm and rate without obvious murmur rub or gallop. Facial symmetry appears intact. He has no leg edema or arm edema.  He we'll continue the tamoxifen and his Coumadin. His Coumadin has been very well controlled. We will see him in 6 months and he does need a followup CAT scan for a questionable nodule in his chest. We will see him right after the lab work and a CAT scan.

## 2013-01-07 ENCOUNTER — Other Ambulatory Visit (HOSPITAL_COMMUNITY): Payer: Self-pay | Admitting: Oncology

## 2013-01-07 ENCOUNTER — Encounter (HOSPITAL_COMMUNITY): Payer: BC Managed Care – PPO | Attending: Internal Medicine

## 2013-01-07 DIAGNOSIS — I6529 Occlusion and stenosis of unspecified carotid artery: Secondary | ICD-10-CM | POA: Insufficient documentation

## 2013-01-07 DIAGNOSIS — Z7901 Long term (current) use of anticoagulants: Secondary | ICD-10-CM | POA: Insufficient documentation

## 2013-01-07 DIAGNOSIS — C50929 Malignant neoplasm of unspecified site of unspecified male breast: Secondary | ICD-10-CM | POA: Insufficient documentation

## 2013-01-07 LAB — PROTIME-INR: INR: 2.51 — ABNORMAL HIGH (ref 0.00–1.49)

## 2013-01-07 NOTE — Progress Notes (Signed)
Labs drawn today for pt 

## 2013-01-22 ENCOUNTER — Encounter: Payer: Self-pay | Admitting: Oncology

## 2013-01-30 ENCOUNTER — Other Ambulatory Visit: Payer: Self-pay | Admitting: Oncology

## 2013-02-11 ENCOUNTER — Other Ambulatory Visit (HOSPITAL_COMMUNITY): Payer: Self-pay | Admitting: Oncology

## 2013-02-11 ENCOUNTER — Encounter (HOSPITAL_COMMUNITY): Payer: BC Managed Care – PPO | Attending: Internal Medicine

## 2013-02-11 DIAGNOSIS — I6529 Occlusion and stenosis of unspecified carotid artery: Secondary | ICD-10-CM | POA: Insufficient documentation

## 2013-02-11 NOTE — Progress Notes (Signed)
Labs drawn today for pt 

## 2013-03-10 ENCOUNTER — Other Ambulatory Visit: Payer: Self-pay | Admitting: Oncology

## 2013-03-11 ENCOUNTER — Encounter (HOSPITAL_COMMUNITY): Payer: BC Managed Care – PPO | Attending: Internal Medicine

## 2013-03-11 DIAGNOSIS — I6529 Occlusion and stenosis of unspecified carotid artery: Secondary | ICD-10-CM

## 2013-03-11 NOTE — Progress Notes (Signed)
Labs drawn today for pt 

## 2013-03-11 NOTE — Addendum Note (Signed)
Addended by: Dennie Maizes on: 03/11/2013 11:32 AM   Modules accepted: Orders

## 2013-03-31 ENCOUNTER — Other Ambulatory Visit (HOSPITAL_COMMUNITY): Payer: Self-pay | Admitting: Oncology

## 2013-03-31 DIAGNOSIS — C50911 Malignant neoplasm of unspecified site of right female breast: Secondary | ICD-10-CM

## 2013-03-31 MED ORDER — TAMOXIFEN CITRATE 10 MG PO TABS: 10.0000 mg | ORAL_TABLET | Freq: Two times a day (BID) | ORAL | Status: DC

## 2013-04-02 ENCOUNTER — Ambulatory Visit (INDEPENDENT_AMBULATORY_CARE_PROVIDER_SITE_OTHER): Payer: BC Managed Care – PPO | Admitting: Cardiology

## 2013-04-02 ENCOUNTER — Encounter: Payer: Self-pay | Admitting: Cardiology

## 2013-04-02 VITALS — BP 156/96 | HR 92 | Ht 75.0 in | Wt 249.1 lb

## 2013-04-02 DIAGNOSIS — E7800 Pure hypercholesterolemia, unspecified: Secondary | ICD-10-CM

## 2013-04-02 DIAGNOSIS — I6350 Cerebral infarction due to unspecified occlusion or stenosis of unspecified cerebral artery: Secondary | ICD-10-CM

## 2013-04-02 DIAGNOSIS — R002 Palpitations: Secondary | ICD-10-CM | POA: Insufficient documentation

## 2013-04-02 DIAGNOSIS — I639 Cerebral infarction, unspecified: Secondary | ICD-10-CM

## 2013-04-02 DIAGNOSIS — I739 Peripheral vascular disease, unspecified: Secondary | ICD-10-CM | POA: Insufficient documentation

## 2013-04-02 DIAGNOSIS — I6521 Occlusion and stenosis of right carotid artery: Secondary | ICD-10-CM

## 2013-04-02 DIAGNOSIS — I6529 Occlusion and stenosis of unspecified carotid artery: Secondary | ICD-10-CM

## 2013-04-02 DIAGNOSIS — Z01810 Encounter for preprocedural cardiovascular examination: Secondary | ICD-10-CM

## 2013-04-02 DIAGNOSIS — I200 Unstable angina: Secondary | ICD-10-CM

## 2013-04-02 DIAGNOSIS — I209 Angina pectoris, unspecified: Secondary | ICD-10-CM

## 2013-04-02 DIAGNOSIS — E785 Hyperlipidemia, unspecified: Secondary | ICD-10-CM | POA: Insufficient documentation

## 2013-04-02 LAB — CBC
HCT: 40.5 % (ref 39.0–52.0)
Hemoglobin: 14.1 g/dL (ref 13.0–17.0)
RBC: 4.72 MIL/uL (ref 4.22–5.81)
WBC: 11.1 10*3/uL — ABNORMAL HIGH (ref 4.0–10.5)

## 2013-04-02 LAB — BASIC METABOLIC PANEL
BUN: 13 mg/dL (ref 6–23)
CO2: 27 mEq/L (ref 19–32)
Chloride: 103 mEq/L (ref 96–112)
Creat: 1.4 mg/dL — ABNORMAL HIGH (ref 0.50–1.35)
Potassium: 4.5 mEq/L (ref 3.5–5.3)

## 2013-04-02 LAB — TSH: TSH: 3.706 u[IU]/mL (ref 0.350–4.500)

## 2013-04-02 MED ORDER — CARVEDILOL 3.125 MG PO TABS: 3.1250 mg | ORAL_TABLET | Freq: Two times a day (BID) | ORAL | Status: DC

## 2013-04-02 MED ORDER — NITROGLYCERIN 0.4 MG SL SUBL: 0.4000 mg | SUBLINGUAL_TABLET | SUBLINGUAL | Status: DC | PRN

## 2013-04-02 NOTE — Assessment & Plan Note (Signed)
We can evaluate these later if they continue to persist following any potential revascularization.

## 2013-04-02 NOTE — Progress Notes (Signed)
 Patient ID: Ryan [name], male   DOB: 03/20/1951, 61 y.o.   MRN: [id number]  Clinic Note: HPI: Ryan [name] is a 61 y.o. male with a PMH below who presents today for urgent evaluation of worsening chest pressure and shortness of breath with exertion. He has an extensive history of Hodgkin's disease back in 1983 treated with mantle radiation, and recently diagnosed invasive ductal carcinoma diagnosed 2 years ago, treated with right radical mastectomy mastectomy and chemotherapy. He also had a TIA/CVA back in 2007 which time his have an occluded right internal carotid artery with moderate disease in left internal carotid artery. He is currently on tamoxifen for his breast cancer, so was switched from Aggrenox to warfarin for stroke prevention. He has dyslipidemia with most recent cholesterol panel showing total shoulder a 9x24 HDL 38 LDL 112 on a statin. He is referred by Dr. John Golding from [facility] for urgent evaluation.  Interval History: He presented to have it Golding today with a couple months of worsening dyspnea on exertion, exercise intolerance and now over last couple weeks with sense of chest heaviness across the upper sternum angled toward the neck/throat with exertion. This began several months ago almost a year ago with shortness of breath and then about several months ago he started noticing a little with this discomfort with significant exertion but now is the point where he gets this smothering pressure in his chest with simply walking from the parking garage to the office. It's relieved with rest and is married happened with exertion to date. It is associated with some shortness of breath and a sense of nausea. He denies any significant palpitations although he has had some palpitations not associated with this symptom. Those are brief fleeting episodes where his feels his heart revealed fast for a few minutes and then to back normal. He also notes having  orthostatic type symptoms of lightheadedness when he first stands up, but not overly significant.  The main thing other than the exertional discomfort he says he just feels totally fatigued he can't do hardly anything and, and used to be relatively active. The discomfort has come on out with less and less amount of exertion and is becoming more and more intense. He is very concerned given his history of carotid stenosis that he may very well have coronary artery disease.   Other pertinent cardiac review of systems includes no melena, hematochezia or hematuria. He does have some symptoms of claudication with effort, but now not as noticeable do to the fact that he did that that level of exertion due to his chest discomfort.   Past Medical History  Diagnosis Date  . Invasive ductal carcinoma of breast, stage 2 04/27/2011  . Right carotid artery occlusion 04/27/2011  . Left carotid artery partial occlusion 04/27/2011  . Stroke     2007  . Hypercholesterolemia   . Adie's pupil   . H/O Hodgkin's disease 04/27/2011  . Breast cancer   . Cancer     hx of hodgkins disease  . Thyroid disease     Prior Cardiac Evaluation and Past Surgical History: Past Surgical History  Procedure Laterality Date  . Mastectomy  rt  . Lymph node biopsy      left neck    No Known Allergies  Current Outpatient Prescriptions  Medication Sig Dispense Refill  . albuterol (PROVENTIL) 2 MG tablet Take 2 mg by mouth as needed.       . ALPRAZolam (XANAX) 0.5   MG tablet Take 0.5 mg by mouth daily.        . aspirin EC 81 MG tablet Take 81 mg by mouth daily.        . citalopram (CELEXA) 40 MG tablet Take 40 mg by mouth daily.        . Coconut Oil 1000 MG CAPS Take 1,000 mg by mouth daily.      . Garlic 500 MG CAPS Take 500 mg by mouth daily.      . Ginkgo Biloba 60 MG CAPS Take 1 capsule by mouth daily.      . levothyroxine (SYNTHROID, LEVOTHROID) 50 MCG tablet Take 50 mcg by mouth daily.      . Omega-3 Fatty Acids  (FISH OIL) 1000 MG CPDR Take 1 each by mouth daily.      . simvastatin (ZOCOR) 40 MG tablet Take 40 mg by mouth at bedtime.        . tamoxifen (NOLVADEX) 10 MG tablet Take 1 tablet (10 mg total) by mouth 2 (two) times daily.  60 tablet  5  . warfarin (COUMADIN) 2 MG tablet TAKE BY MOUTH AS DIRECTED  50 tablet  0  . warfarin (COUMADIN) 5 MG tablet Taking 4mg, 4mg, 5mg.      . carvedilol (COREG) 3.125 MG tablet Take 1 tablet (3.125 mg total) by mouth 2 (two) times daily with a meal.  90 tablet  3  . nitroGLYCERIN (NITROSTAT) 0.4 MG SL tablet Place 1 tablet (0.4 mg total) under the tongue every 5 (five) minutes as needed for chest pain.  25 tablet  3   No current facility-administered medications for this visit.    History   Social History  . Marital Status: Married    Spouse Name: [name]    Number of Children: [name]  . Years of Education: [name]   Occupational History  . Not on file.   Social History Main Topics  . Smoking status: Former Smoker    Types: Cigarettes    Quit date: 10/09/1981  . Smokeless tobacco: Never Used  . Alcohol Use: No  . Drug Use: No  . Sexually Active:    Other Topics Concern  . Not on file   Social History Narrative  . No narrative on file   Family History  Problem Relation Age of Onset  . Alzheimer's disease Mother   . Stroke Mother   . Hypertension Mother   . Hypertension Father   . Hyperlipidemia Mother   . Diabetes Mother   . Diabetes Paternal Grandmother   . Diabetes Paternal Grandfather     ROS: A comprehensive Review of Systems - Negative except Pertinent positives as above, as well as minor positives below. General ROS: positive for  - fatigue and malaise negative for - chills, fever, hot flashes, night sweats or sleep disturbance Gastrointestinal ROS: no abdominal pain, change in bowel habits, or black or bloody stools Genito-Urinary ROS: no dysuria, trouble voiding, or hematuria Musculoskeletal ROS: positive for - Mild arthritic pains in  his knees hands and feet/ankles Dermatological ROS: positive for Recent rash from what he believes may have been a tick bite or some type of insect bite. Recently treated with doxycycline and improved.  PHYSICAL EXAM BP 156/96  Pulse 92  Ht 6' 3" (1.905 m)  Wt 249 lb 1.6 oz (112.991 kg)  BMI 31.14 kg/m2 General appearance: alert, cooperative, appears stated age, no distress and mildly obese - mostly truncal; otherwise relatively healthy appearing. Answers questions appropriately. Well-nourished   and well-groomed Neck: no adenopathy, no carotid bruit and no JVD Lungs: clear to auscultation bilaterally, normal percussion bilaterally and non-labored Heart: regular rate and rhythm, S1, S2 normal, no murmur, click, rub or gallop; nondisplaced PMI Abdomen: soft, non-tender; bowel sounds normal; no masses,  no organomegaly; no HJR Extremities: extremities normal, atraumatic, no cyanosis, and edema ; no significant venous stasis changes or rashes. Pulses: 2+ and symmetric; palpable throughout Skin: normal and mobility and turgor normal or He does have a couple healing wounds on his shins from the insect bites Neurologic: Mental status: Alert, oriented, thought content appropriate Cranial nerves: normal (II-XII grossly intact)   EKG:Performed today: Yes Rate: 92 , Rhythm: Normal sinus;  left axis deviation, otherwise normal ECG   ASSESSMENT: Given his carotid disease, family history of cardiac risk factors and the progressive nature of his symptoms. I'm very concerned the symptoms are consistent with Class III Angina/Crescendo Unstable Angina. Will refer for cardiac catheterization on Monday morning, June 30.  Angina, class III - progressively worsening exertional chest pressure & dyspnea, now with minimal exertion. - Plan: EKG 12-Lead  Unstable angina - Plan: nitroGLYCERIN (NITROSTAT) 0.4 MG SL tablet, LEFT HEART CATH, EKG 12-Lead  Heart palpitations - Plan: EKG 12-Lead, CANCELED: EKG  12-Lead  Hypercholesterolemia - Plan: EKG 12-Lead  Right carotid artery occlusion  Stroke - Plan: EKG 12-Lead  Pre-operative cardiovascular examination - Plan: Basic Metabolic Panel (BMET), PTT, INR/PT, CBC, TSH  Claudication of calf muscles  PLAN: Per problem list. Orders Placed This Encounter  Procedures  . Basic Metabolic Panel (BMET)  . PTT  . INR/PT  . CBC  . TSH  . EKG 12-Lead    Done in [facility]    Order Specific Question:  Where should this test be performed    Answer:  OTHER  . LEFT HEART CATH    Scheduling Instructions:     Schedule for 04/07/13 rgt femoral stick    Followup:  Post cath  Edwardo Wojnarowski W, M.D., M.S. [facility] [address] [place], [place]  [address]  [phone] Pager # [phone] 04/02/2013 11:37 PM     

## 2013-04-02 NOTE — Assessment & Plan Note (Signed)
His angina is currently probably class III, but given the rapid progression of symptoms it could also be considered crescendo unstable angina. This increases my level of concern in the index of suspicion. Therefore, I will start him on carvedilol 3.125 mg twice a day, and provide sublingual nitroglycerin when necessary. If he has any of these symptoms at rest, or has an episode that does not go away with rest or nitroglycerin, I advised him to go straight to the emergency room.

## 2013-04-02 NOTE — Assessment & Plan Note (Signed)
Were checking basic laboratory values and chest x-ray. We will also get an INR checked prior to proceeding. We holding his warfarin starting tonight. I would likely switch him from warfarin to Brilinta if I had to do any stenting.

## 2013-04-02 NOTE — Patient Instructions (Addendum)
Your physician has requested that you have a cardiac catheterization. Cardiac catheterization is used to diagnose and/or treat various heart conditions. Doctors may recommend this procedure for a number of different reasons. The most common reason is to evaluate chest pain. Chest pain can be a symptom of coronary artery disease (CAD), and cardiac catheterization can show whether plaque is narrowing or blocking your heart's arteries. This procedure is also used to evaluate the valves, as well as measure the blood flow and oxygen levels in different parts of your heart. For further information please visit [email or web address] Please follow instruction sheet, as given.  Your physician has recommended you make the following change in your medication stop warfarin now and then start 2 tablets aspirin 81 mg day until cath. Have labs done today.  IF YOU HAVE ANY CHEST PAIN AT REST OR  PAIN NOT RELIEVED AFTER TAKING 2 NTG  GO TO [place].

## 2013-04-02 NOTE — Assessment & Plan Note (Signed)
With his bilateral carotid disease and notable right carotid occlusion follow Dr. Durwin Nora, if he were to need bypass surgery for coronary disease, we would have to consult Dr. Durwin Nora for possible simultaneous carotid endarterectomy.

## 2013-04-02 NOTE — Assessment & Plan Note (Signed)
I'm not sure at this particular time if what he is describing is true claudication, main reason is disease is not able to exert himself enough to get the symptom again. Once we were able to delineate his coronary anatomy and proceed with any revascularization the need to be done, I would then checked lower extremity arterial Dopplers and ABIs to evaluate for peripheral vascular disease.

## 2013-04-02 NOTE — Assessment & Plan Note (Signed)
Currently on simvastatin 40 mg. Based on his current symptoms and my high index of suspicion that his catheterization will show coronary disease, we may need to target his LDL goal to less than 70 as it lasted 100. With that in mind, I would want to consider switching from simvastatin to either Lipitor or Crestor post-cath.

## 2013-04-02 NOTE — Assessment & Plan Note (Signed)
Given the relatively rapid progression of his symptoms from relatively significant exertion to now minimal exertion, and a feeling for most appropriate approach would be to proceed with diagnostic cardiac catheterization and possible PCI. He ordered he has documented vascular disease which is a risk equivalent coronary disease now with progressive angina, a negative Myoview would not reduce my index of suspicion for significant coronary disease.  Will plan to schedule cardiac catheterization for this Monday morning June 30. This will allow [place] to hold his warfarin starting tonight. Because of his radical mastectomy on the right arm out of her not to use right radial approach, simply because everywhere but the anatomy moving to the brachiocephalic artery.  I'm also reluctant to use the left radial artery, as this may take away a potential conduit if he needs bypass surgery. We discussed the different options and he would prefer to go with a femoral approach.  The procedure with Risks/Benefits/Alternatives and Indications was reviewed with the patient end his wife.  All questions were answered.    Risks / Complications include, but not limited to: Death, MI, CVA/TIA, VF/VT (with defibrillation), Bradycardia (need for temporary pacer placement), contrast induced nephropathy  bleeding / bruising / hematoma / pseudoaneurysm, vascular or coronary injury (with possible emergent CT or Vascular Surgery), adverse medication reactions, infection.    The patient and his wife voice understanding and agree to proceed.

## 2013-04-03 ENCOUNTER — Encounter (HOSPITAL_COMMUNITY): Payer: Self-pay | Admitting: Pharmacy Technician

## 2013-04-03 ENCOUNTER — Telehealth: Payer: Self-pay | Admitting: *Deleted

## 2013-04-03 LAB — PROTIME-INR
INR: 2.41 — ABNORMAL HIGH (ref <1.50)
Prothrombin Time: 25.3 seconds — ABNORMAL HIGH (ref 11.6–15.2)

## 2013-04-03 NOTE — Telephone Encounter (Signed)
Message copied by Tobin Chad on Thu Apr 03, 2013  9:53 AM ------      Message from: Herbie Baltimore, DAVID      Created: Thu Apr 03, 2013 12:35 AM       Labs look pretty good, we'll need to use a higher precath hydration rate of fluid.  Give 125 mL an hour, after 250 mL bolus on arrival. ------

## 2013-04-03 NOTE — Telephone Encounter (Signed)
Spoke to patient. Results given. Patient aware he will have PT/PTT at hospital on Monday 6/30.

## 2013-04-04 ENCOUNTER — Other Ambulatory Visit: Payer: Self-pay | Admitting: *Deleted

## 2013-04-04 DIAGNOSIS — Z01810 Encounter for preprocedural cardiovascular examination: Secondary | ICD-10-CM

## 2013-04-04 MED ORDER — SODIUM CHLORIDE 0.9 % IV SOLN: INTRAVENOUS | Status: DC

## 2013-04-07 ENCOUNTER — Other Ambulatory Visit: Payer: Self-pay | Admitting: *Deleted

## 2013-04-07 ENCOUNTER — Encounter (HOSPITAL_COMMUNITY)
Admission: RE | Disposition: A | Discharge status: Home / Self Care | Payer: Self-pay | Source: Ambulatory Visit | Attending: Cardiothoracic Surgery

## 2013-04-07 ENCOUNTER — Inpatient Hospital Stay (HOSPITAL_COMMUNITY): Payer: BC Managed Care – PPO

## 2013-04-07 ENCOUNTER — Encounter (HOSPITAL_COMMUNITY): Payer: Self-pay | Admitting: Certified Registered Nurse Anesthetist

## 2013-04-07 ENCOUNTER — Inpatient Hospital Stay (HOSPITAL_COMMUNITY)
Admission: RE | Admit: 2013-04-07 | Discharge: 2013-04-16 | DRG: 107 | Disposition: A | Discharge status: Home / Self Care | Payer: BC Managed Care – PPO | Source: Ambulatory Visit | Attending: Cardiothoracic Surgery | Admitting: Cardiothoracic Surgery

## 2013-04-07 DIAGNOSIS — I200 Unstable angina: Secondary | ICD-10-CM | POA: Diagnosis present

## 2013-04-07 DIAGNOSIS — I079 Rheumatic tricuspid valve disease, unspecified: Secondary | ICD-10-CM | POA: Diagnosis present

## 2013-04-07 DIAGNOSIS — Z01810 Encounter for preprocedural cardiovascular examination: Secondary | ICD-10-CM

## 2013-04-07 DIAGNOSIS — R002 Palpitations: Secondary | ICD-10-CM

## 2013-04-07 DIAGNOSIS — E785 Hyperlipidemia, unspecified: Secondary | ICD-10-CM | POA: Diagnosis present

## 2013-04-07 DIAGNOSIS — E876 Hypokalemia: Secondary | ICD-10-CM | POA: Diagnosis not present

## 2013-04-07 DIAGNOSIS — C8190 Hodgkin lymphoma, unspecified, unspecified site: Secondary | ICD-10-CM | POA: Diagnosis present

## 2013-04-07 DIAGNOSIS — E669 Obesity, unspecified: Secondary | ICD-10-CM | POA: Diagnosis present

## 2013-04-07 DIAGNOSIS — Z8571 Personal history of Hodgkin lymphoma: Secondary | ICD-10-CM

## 2013-04-07 DIAGNOSIS — I6529 Occlusion and stenosis of unspecified carotid artery: Secondary | ICD-10-CM | POA: Diagnosis present

## 2013-04-07 DIAGNOSIS — I4510 Unspecified right bundle-branch block: Secondary | ICD-10-CM | POA: Diagnosis present

## 2013-04-07 DIAGNOSIS — D62 Acute posthemorrhagic anemia: Secondary | ICD-10-CM | POA: Diagnosis not present

## 2013-04-07 DIAGNOSIS — I209 Angina pectoris, unspecified: Secondary | ICD-10-CM | POA: Diagnosis present

## 2013-04-07 DIAGNOSIS — Z8673 Personal history of transient ischemic attack (TIA), and cerebral infarction without residual deficits: Secondary | ICD-10-CM

## 2013-04-07 DIAGNOSIS — Z9221 Personal history of antineoplastic chemotherapy: Secondary | ICD-10-CM

## 2013-04-07 DIAGNOSIS — I2510 Atherosclerotic heart disease of native coronary artery without angina pectoris: Secondary | ICD-10-CM

## 2013-04-07 DIAGNOSIS — I4891 Unspecified atrial fibrillation: Secondary | ICD-10-CM | POA: Diagnosis not present

## 2013-04-07 DIAGNOSIS — Z79899 Other long term (current) drug therapy: Secondary | ICD-10-CM

## 2013-04-07 DIAGNOSIS — J90 Pleural effusion, not elsewhere classified: Secondary | ICD-10-CM | POA: Diagnosis not present

## 2013-04-07 DIAGNOSIS — Y842 Radiological procedure and radiotherapy as the cause of abnormal reaction of the patient, or of later complication, without mention of misadventure at the time of the procedure: Secondary | ICD-10-CM | POA: Diagnosis present

## 2013-04-07 DIAGNOSIS — Z7901 Long term (current) use of anticoagulants: Secondary | ICD-10-CM

## 2013-04-07 DIAGNOSIS — E7800 Pure hypercholesterolemia, unspecified: Secondary | ICD-10-CM | POA: Diagnosis present

## 2013-04-07 DIAGNOSIS — Z951 Presence of aortocoronary bypass graft: Secondary | ICD-10-CM

## 2013-04-07 DIAGNOSIS — N289 Disorder of kidney and ureter, unspecified: Secondary | ICD-10-CM | POA: Diagnosis not present

## 2013-04-07 DIAGNOSIS — I059 Rheumatic mitral valve disease, unspecified: Secondary | ICD-10-CM | POA: Diagnosis present

## 2013-04-07 DIAGNOSIS — E8779 Other fluid overload: Secondary | ICD-10-CM | POA: Diagnosis not present

## 2013-04-07 DIAGNOSIS — I359 Nonrheumatic aortic valve disorder, unspecified: Secondary | ICD-10-CM | POA: Diagnosis present

## 2013-04-07 DIAGNOSIS — C50929 Malignant neoplasm of unspecified site of unspecified male breast: Secondary | ICD-10-CM | POA: Diagnosis present

## 2013-04-07 DIAGNOSIS — E079 Disorder of thyroid, unspecified: Secondary | ICD-10-CM | POA: Diagnosis present

## 2013-04-07 DIAGNOSIS — I739 Peripheral vascular disease, unspecified: Secondary | ICD-10-CM | POA: Diagnosis present

## 2013-04-07 DIAGNOSIS — I6521 Occlusion and stenosis of right carotid artery: Secondary | ICD-10-CM | POA: Diagnosis present

## 2013-04-07 DIAGNOSIS — I10 Essential (primary) hypertension: Secondary | ICD-10-CM | POA: Diagnosis present

## 2013-04-07 DIAGNOSIS — C50919 Malignant neoplasm of unspecified site of unspecified female breast: Secondary | ICD-10-CM | POA: Diagnosis present

## 2013-04-07 DIAGNOSIS — Z87891 Personal history of nicotine dependence: Secondary | ICD-10-CM

## 2013-04-07 HISTORY — DX: Atherosclerotic heart disease of native coronary artery without angina pectoris: I25.10

## 2013-04-07 HISTORY — DX: Presence of aortocoronary bypass graft: Z95.1

## 2013-04-07 HISTORY — PX: LEFT HEART CATHETERIZATION WITH CORONARY ANGIOGRAM: SHX5451

## 2013-04-07 LAB — PROTIME-INR: Prothrombin Time: 13.8 seconds (ref 11.6–15.2)

## 2013-04-07 LAB — SURGICAL PCR SCREEN
MRSA, PCR: NEGATIVE
Staphylococcus aureus: NEGATIVE

## 2013-04-07 SURGERY — LEFT HEART CATHETERIZATION WITH CORONARY ANGIOGRAM
Anesthesia: LOCAL

## 2013-04-07 MED ORDER — ASPIRIN EC 81 MG PO TBEC
162.0000 mg | DELAYED_RELEASE_TABLET | Freq: Every day | ORAL | Status: DC
  Filled 2013-04-07: qty 2

## 2013-04-07 MED ORDER — SODIUM CHLORIDE 0.9 % IJ SOLN: 3.0000 mL | Freq: Two times a day (BID) | INTRAMUSCULAR | Status: DC

## 2013-04-07 MED ORDER — LEVOTHYROXINE SODIUM 50 MCG PO TABS
50.0000 ug | ORAL_TABLET | Freq: Every day | ORAL | Status: DC
  Filled 2013-04-07 (×2): qty 1

## 2013-04-07 MED ORDER — TAMOXIFEN CITRATE 10 MG PO TABS
10.0000 mg | ORAL_TABLET | Freq: Two times a day (BID) | ORAL | Status: DC
  Administered 2013-04-07: 10 mg via ORAL
  Filled 2013-04-07 (×4): qty 1

## 2013-04-07 MED ORDER — ATORVASTATIN CALCIUM 80 MG PO TABS
80.0000 mg | ORAL_TABLET | Freq: Every day | ORAL | Status: DC
  Administered 2013-04-07: 80 mg via ORAL
  Filled 2013-04-07 (×2): qty 1

## 2013-04-07 MED ORDER — HEPARIN (PORCINE) IN NACL 100-0.45 UNIT/ML-% IJ SOLN
1250.0000 [IU]/h | INTRAMUSCULAR | Status: DC
  Administered 2013-04-07: 1250 [IU]/h via INTRAVENOUS
  Filled 2013-04-07 (×2): qty 250

## 2013-04-07 MED ORDER — DIAZEPAM 5 MG PO TABS
ORAL_TABLET | ORAL | Status: AC
  Administered 2013-04-07: 5 mg via ORAL
  Filled 2013-04-07: qty 1

## 2013-04-07 MED ORDER — CITALOPRAM HYDROBROMIDE 40 MG PO TABS
40.0000 mg | ORAL_TABLET | Freq: Every day | ORAL | Status: DC
  Filled 2013-04-07: qty 1

## 2013-04-07 MED ORDER — MIDAZOLAM HCL 2 MG/2ML IJ SOLN
INTRAMUSCULAR | Status: AC
  Filled 2013-04-07: qty 2

## 2013-04-07 MED ORDER — SODIUM CHLORIDE 0.9 % IV SOLN: 1.0000 mL/kg/h | INTRAVENOUS | Status: AC

## 2013-04-07 MED ORDER — TEMAZEPAM 15 MG PO CAPS: 15.0000 mg | ORAL_CAPSULE | Freq: Once | ORAL | Status: AC | PRN

## 2013-04-07 MED ORDER — ALPRAZOLAM 0.5 MG PO TABS: 1.0000 mg | ORAL_TABLET | Freq: Every evening | ORAL | Status: DC | PRN

## 2013-04-07 MED ORDER — METOPROLOL TARTRATE 12.5 MG HALF TABLET: 12.5000 mg | ORAL_TABLET | Freq: Once | ORAL | Status: DC

## 2013-04-07 MED ORDER — DEXMEDETOMIDINE HCL IN NACL 400 MCG/100ML IV SOLN
0.1000 ug/kg/h | INTRAVENOUS | Status: AC
  Administered 2013-04-08: 0.5 ug/kg/h via INTRAVENOUS
  Filled 2013-04-07: qty 100

## 2013-04-07 MED ORDER — PHENYLEPHRINE HCL 10 MG/ML IJ SOLN
30.0000 ug/min | INTRAVENOUS | Status: AC
  Administered 2013-04-08: 20 ug/min via INTRAVENOUS
  Filled 2013-04-07: qty 2

## 2013-04-07 MED ORDER — NITROGLYCERIN 0.4 MG SL SUBL: 0.4000 mg | SUBLINGUAL_TABLET | SUBLINGUAL | Status: DC | PRN

## 2013-04-07 MED ORDER — POTASSIUM CHLORIDE 2 MEQ/ML IV SOLN
80.0000 meq | INTRAVENOUS | Status: DC
  Filled 2013-04-07: qty 40

## 2013-04-07 MED ORDER — CHLORHEXIDINE GLUCONATE 4 % EX LIQD
60.0000 mL | Freq: Once | CUTANEOUS | Status: AC
  Administered 2013-04-08: 4 via TOPICAL
  Filled 2013-04-07: qty 60

## 2013-04-07 MED ORDER — LEVOFLOXACIN IN D5W 500 MG/100ML IV SOLN
500.0000 mg | INTRAVENOUS | Status: AC
  Administered 2013-04-08: 500 mg via INTRAVENOUS
  Filled 2013-04-07: qty 100

## 2013-04-07 MED ORDER — CARVEDILOL 3.125 MG PO TABS
3.1250 mg | ORAL_TABLET | Freq: Two times a day (BID) | ORAL | Status: DC
  Administered 2013-04-07: 3.125 mg via ORAL
  Filled 2013-04-07 (×4): qty 1

## 2013-04-07 MED ORDER — SODIUM CHLORIDE 0.9 % IV SOLN
Freq: Once | INTRAVENOUS | Status: AC
  Administered 2013-04-07: 08:00:00 via INTRAVENOUS

## 2013-04-07 MED ORDER — SODIUM CHLORIDE 0.9 % IV SOLN
INTRAVENOUS | Status: DC
  Administered 2013-04-07: 18:00:00 via INTRAVENOUS
  Administered 2013-04-08: 1000 mL via INTRAVENOUS

## 2013-04-07 MED ORDER — LIDOCAINE HCL (PF) 1 % IJ SOLN
INTRAMUSCULAR | Status: AC
  Filled 2013-04-07: qty 30

## 2013-04-07 MED ORDER — ACETAMINOPHEN 325 MG PO TABS: 650.0000 mg | ORAL_TABLET | ORAL | Status: DC | PRN

## 2013-04-07 MED ORDER — CHLORHEXIDINE GLUCONATE 4 % EX LIQD
60.0000 mL | Freq: Once | CUTANEOUS | Status: AC
  Administered 2013-04-07: 4 via TOPICAL
  Filled 2013-04-07: qty 15

## 2013-04-07 MED ORDER — DIAZEPAM 5 MG PO TABS: 5.0000 mg | ORAL_TABLET | ORAL | Status: DC | PRN

## 2013-04-07 MED ORDER — SODIUM CHLORIDE 0.9 % IV SOLN: 250.0000 mL | INTRAVENOUS | Status: DC | PRN

## 2013-04-07 MED ORDER — PLASMA-LYTE 148 IV SOLN
INTRAVENOUS | Status: AC
  Administered 2013-04-08: 09:00:00
  Filled 2013-04-07: qty 2.5

## 2013-04-07 MED ORDER — NITROGLYCERIN 0.2 MG/ML ON CALL CATH LAB
INTRAVENOUS | Status: AC
  Filled 2013-04-07: qty 1

## 2013-04-07 MED ORDER — CHLORHEXIDINE GLUCONATE 4 % EX LIQD: 60.0000 mL | Freq: Once | CUTANEOUS | Status: DC

## 2013-04-07 MED ORDER — NITROGLYCERIN IN D5W 200-5 MCG/ML-% IV SOLN
2.0000 ug/min | INTRAVENOUS | Status: AC
  Administered 2013-04-08: 10 ug/min via INTRAVENOUS
  Filled 2013-04-07: qty 250

## 2013-04-07 MED ORDER — BISACODYL 5 MG PO TBEC
5.0000 mg | DELAYED_RELEASE_TABLET | Freq: Once | ORAL | Status: AC
  Administered 2013-04-07: 5 mg via ORAL
  Filled 2013-04-07: qty 1

## 2013-04-07 MED ORDER — SODIUM CHLORIDE 0.9 % IV SOLN
INTRAVENOUS | Status: AC
  Administered 2013-04-08: 2 [IU]/h via INTRAVENOUS
  Filled 2013-04-07: qty 1

## 2013-04-07 MED ORDER — MAGNESIUM SULFATE 50 % IJ SOLN
40.0000 meq | INTRAMUSCULAR | Status: DC
  Filled 2013-04-07: qty 10

## 2013-04-07 MED ORDER — SODIUM CHLORIDE 0.9 % IJ SOLN
3.0000 mL | Freq: Two times a day (BID) | INTRAMUSCULAR | Status: DC
  Administered 2013-04-07: 3 mL via INTRAVENOUS

## 2013-04-07 MED ORDER — TEMAZEPAM 15 MG PO CAPS: 15.0000 mg | ORAL_CAPSULE | Freq: Once | ORAL | Status: DC | PRN

## 2013-04-07 MED ORDER — ONDANSETRON HCL 4 MG/2ML IJ SOLN: 4.0000 mg | Freq: Four times a day (QID) | INTRAMUSCULAR | Status: DC | PRN

## 2013-04-07 MED ORDER — DOPAMINE-DEXTROSE 3.2-5 MG/ML-% IV SOLN
2.0000 ug/kg/min | INTRAVENOUS | Status: AC
  Administered 2013-04-08: 3 ug/kg/min via INTRAVENOUS
  Filled 2013-04-07: qty 250

## 2013-04-07 MED ORDER — VANCOMYCIN HCL 10 G IV SOLR
1500.0000 mg | INTRAVENOUS | Status: AC
  Administered 2013-04-08: 1500 mg via INTRAVENOUS
  Filled 2013-04-07: qty 1500

## 2013-04-07 MED ORDER — SODIUM CHLORIDE 0.9 % IV SOLN
INTRAVENOUS | Status: AC
  Administered 2013-04-08: 69.8 mL/h via INTRAVENOUS
  Administered 2013-04-08: 13:00:00 via INTRAVENOUS
  Filled 2013-04-07: qty 40

## 2013-04-07 MED ORDER — FENTANYL CITRATE 0.05 MG/ML IJ SOLN
INTRAMUSCULAR | Status: AC
  Filled 2013-04-07: qty 2

## 2013-04-07 MED ORDER — HEPARIN (PORCINE) IN NACL 2-0.9 UNIT/ML-% IJ SOLN
INTRAMUSCULAR | Status: AC
  Filled 2013-04-07: qty 1000

## 2013-04-07 MED ORDER — BISACODYL 5 MG PO TBEC: 5.0000 mg | DELAYED_RELEASE_TABLET | Freq: Once | ORAL | Status: DC

## 2013-04-07 MED ORDER — METOPROLOL TARTRATE 12.5 MG HALF TABLET
12.5000 mg | ORAL_TABLET | Freq: Once | ORAL | Status: AC
  Administered 2013-04-08: 12.5 mg via ORAL
  Filled 2013-04-07: qty 1

## 2013-04-07 MED ORDER — SODIUM CHLORIDE 0.9 % IJ SOLN: 3.0000 mL | INTRAMUSCULAR | Status: DC | PRN

## 2013-04-07 MED ORDER — EPINEPHRINE HCL 1 MG/ML IJ SOLN
0.5000 ug/min | INTRAVENOUS | Status: DC
  Filled 2013-04-07 (×2): qty 4

## 2013-04-07 MED ORDER — DIAZEPAM 5 MG PO TABS: 5.0000 mg | ORAL_TABLET | ORAL | Status: AC

## 2013-04-07 MED ORDER — SODIUM CHLORIDE 0.9 % IV SOLN
INTRAVENOUS | Status: DC
  Filled 2013-04-07: qty 30

## 2013-04-07 MED ORDER — ALBUTEROL SULFATE (5 MG/ML) 0.5% IN NEBU
2.5000 mg | INHALATION_SOLUTION | Freq: Once | RESPIRATORY_TRACT | Status: AC
  Administered 2013-04-07: 2.5 mg via RESPIRATORY_TRACT

## 2013-04-07 NOTE — H&P (View-Only) (Signed)
Patient ID: Ryan [name], male   DOB: 07/18/1951, 62 y.o.   MRN: [id number]  Clinic Note: HPI: Ryan [name] is a 62 y.o. male with a PMH below who presents today for urgent evaluation of worsening chest pressure and shortness of breath with exertion. He has an extensive history of Hodgkin's disease back in 1983 treated with mantle radiation, and recently diagnosed invasive ductal carcinoma diagnosed 2 years ago, treated with right radical mastectomy mastectomy and chemotherapy. He also had a TIA/CVA back in 2007 which time his have an occluded right internal carotid artery with moderate disease in left internal carotid artery. He is currently on tamoxifen for his breast cancer, so was switched from Aggrenox to warfarin for stroke prevention. He has dyslipidemia with most recent cholesterol panel showing total shoulder a 9x24 HDL 38 LDL 112 on a statin. He is referred by Dr. Assunta Found from [facility] for urgent evaluation.  Interval History: He presented to have it Ryan [name] today with a couple months of worsening dyspnea on exertion, exercise intolerance and now over last couple weeks with sense of chest heaviness across the upper sternum angled toward the neck/throat with exertion. This began several months ago almost a year ago with shortness of breath and then about several months ago he started noticing a little with this discomfort with significant exertion but now is the point where he gets this smothering pressure in his chest with simply walking from the parking garage to the office. It's relieved with rest and is married happened with exertion to date. It is associated with some shortness of breath and a sense of nausea. He denies any significant palpitations although he has had some palpitations not associated with this symptom. Those are brief fleeting episodes where his feels his heart revealed fast for a few minutes and then to back normal. He also notes having  orthostatic type symptoms of lightheadedness when he first stands up, but not overly significant.  The main thing other than the exertional discomfort he says he just feels totally fatigued he can't do hardly anything and, and used to be relatively active. The discomfort has come on out with less and less amount of exertion and is becoming more and more intense. He is very concerned given his history of carotid stenosis that he may very well have coronary artery disease.   Other pertinent cardiac review of systems includes no melena, hematochezia or hematuria. He does have some symptoms of claudication with effort, but now not as noticeable do to the fact that he did that that level of exertion due to his chest discomfort.   Past Medical History  Diagnosis Date  . Invasive ductal carcinoma of breast, stage 2 04/27/2011  . Right carotid artery occlusion 04/27/2011  . Left carotid artery partial occlusion 04/27/2011  . Stroke     2007  . Hypercholesterolemia   . Adie's pupil   . H/O Hodgkin's disease 04/27/2011  . Breast cancer   . Cancer     hx of hodgkins disease  . Thyroid disease     Prior Cardiac Evaluation and Past Surgical History: Past Surgical History  Procedure Laterality Date  . Mastectomy  rt  . Lymph node biopsy      left neck    No Known Allergies  Current Outpatient Prescriptions  Medication Sig Dispense Refill  . albuterol (PROVENTIL) 2 MG tablet Take 2 mg by mouth as needed.       . ALPRAZolam (XANAX) 0.5  MG tablet Take 0.5 mg by mouth daily.        Marland Kitchen aspirin EC 81 MG tablet Take 81 mg by mouth daily.        . citalopram (CELEXA) 40 MG tablet Take 40 mg by mouth daily.        . Coconut Oil 1000 MG CAPS Take 1,000 mg by mouth daily.      . Garlic 500 MG CAPS Take 500 mg by mouth daily.      . Ginkgo Biloba 60 MG CAPS Take 1 capsule by mouth daily.      Marland Kitchen levothyroxine (SYNTHROID, LEVOTHROID) 50 MCG tablet Take 50 mcg by mouth daily.      . Omega-3 Fatty Acids  (FISH OIL) 1000 MG CPDR Take 1 each by mouth daily.      . simvastatin (ZOCOR) 40 MG tablet Take 40 mg by mouth at bedtime.        . tamoxifen (NOLVADEX) 10 MG tablet Take 1 tablet (10 mg total) by mouth 2 (two) times daily.  60 tablet  5  . warfarin (COUMADIN) 2 MG tablet TAKE BY MOUTH AS DIRECTED  50 tablet  0  . warfarin (COUMADIN) 5 MG tablet Taking 4mg , 4mg , 5mg .      . carvedilol (COREG) 3.125 MG tablet Take 1 tablet (3.125 mg total) by mouth 2 (two) times daily with a meal.  90 tablet  3  . nitroGLYCERIN (NITROSTAT) 0.4 MG SL tablet Place 1 tablet (0.4 mg total) under the tongue every 5 (five) minutes as needed for chest pain.  25 tablet  3   No current facility-administered medications for this visit.    History   Social History  . Marital Status: Married    Spouse Name: [name]    Number of Children: [name]  . Years of Education: [name]   Occupational History  . Not on file.   Social History Main Topics  . Smoking status: Former Smoker    Types: Cigarettes    Quit date: 10/09/1981  . Smokeless tobacco: Never Used  . Alcohol Use: No  . Drug Use: No  . Sexually Active:    Other Topics Concern  . Not on file   Social History Narrative  . No narrative on file   Family History  Problem Relation Age of Onset  . Alzheimer's disease Mother   . Stroke Mother   . Hypertension Mother   . Hypertension Father   . Hyperlipidemia Mother   . Diabetes Mother   . Diabetes Paternal Grandmother   . Diabetes Paternal Grandfather     ROS: A comprehensive Review of Systems - Negative except Pertinent positives as above, as well as minor positives below. General ROS: positive for  - fatigue and malaise negative for - chills, fever, hot flashes, night sweats or sleep disturbance Gastrointestinal ROS: no abdominal pain, change in bowel habits, or black or bloody stools Genito-Urinary ROS: no dysuria, trouble voiding, or hematuria Musculoskeletal ROS: positive for - Mild arthritic pains in  his knees hands and feet/ankles Dermatological ROS: positive for Recent rash from what he believes may have been a tick bite or some type of insect bite. Recently treated with doxycycline and improved.  PHYSICAL EXAM BP 156/96  Pulse 92  Ht 6\' 3"  (1.905 m)  Wt 249 lb 1.6 oz (112.991 kg)  BMI 31.14 kg/m2 General appearance: alert, cooperative, appears stated age, no distress and mildly obese - mostly truncal; otherwise relatively healthy appearing. Answers questions appropriately. Well-nourished  and well-groomed Neck: no adenopathy, no carotid bruit and no JVD Lungs: clear to auscultation bilaterally, normal percussion bilaterally and non-labored Heart: regular rate and rhythm, S1, S2 normal, no murmur, click, rub or gallop; nondisplaced PMI Abdomen: soft, non-tender; bowel sounds normal; no masses,  no organomegaly; no HJR Extremities: extremities normal, atraumatic, no cyanosis, and edema ; no significant venous stasis changes or rashes. Pulses: 2+ and symmetric; palpable throughout Skin: normal and mobility and turgor normal or He does have a couple healing wounds on his shins from the insect bites Neurologic: Mental status: Alert, oriented, thought content appropriate Cranial nerves: normal (II-XII grossly intact)   [id number] today: Yes Rate: 92 , Rhythm: Normal sinus;  left axis deviation, otherwise normal ECG   ASSESSMENT: Given his carotid disease, family history of cardiac risk factors and the progressive nature of his symptoms. I'm very concerned the symptoms are consistent with Class III Angina/Crescendo Unstable Angina. Will refer for cardiac catheterization on Monday morning, June 30.  Angina, class III - progressively worsening exertional chest pressure & dyspnea, now with minimal exertion. - Plan: EKG 12-Lead  Unstable angina - Plan: nitroGLYCERIN (NITROSTAT) 0.4 MG SL tablet, LEFT HEART CATH, EKG 12-Lead  Heart palpitations - Plan: EKG 12-Lead, CANCELED: EKG  12-Lead  Hypercholesterolemia - Plan: EKG 12-Lead  Right carotid artery occlusion  Stroke - Plan: EKG 12-Lead  Pre-operative cardiovascular examination - Plan: Basic Metabolic Panel (BMET), PTT, INR/PT, CBC, TSH  Claudication of calf muscles  PLAN: Per problem list. Orders Placed This Encounter  Procedures  . Basic Metabolic Panel (BMET)  . PTT  . INR/PT  . CBC  . TSH  . EKG 12-Lead    Done in [facility]    Order Specific Question:  Where should this test be performed    Answer:  OTHER  . LEFT HEART CATH    Scheduling Instructions:     Schedule for 04/07/13 rgt femoral stick    Followup:  Post cath  Marykay Lex, M.D., M.S. [facility] [address] [place], [place]  [address]  [phone] Pager # [phone] 04/02/2013 11:37 PM

## 2013-04-07 NOTE — Interval H&P Note (Signed)
History and Physical Interval Note:  04/07/2013 8:41 AM  Ryan [name]  has presented today for surgery, with the diagnosis of Class III-IV / Unstable Angina.  The various methods of treatment have been discussed with the patient and family. After consideration of risks, benefits and other options for treatment, the patient has consented to  Procedure(s): LEFT HEART CATHETERIZATION WITH CORONARY ANGIOGRAM ([name]) with possible PCI as a surgical intervention .  The patient's history has been reviewed, patient examined, no change in status, stable for surgery.  I have reviewed the patient's chart and labs.  Questions were answered to the patient's satisfaction.    Consent signed & on chart.  Cath Lab Visit (complete for each Cath Lab visit)  Clinical Evaluation Leading to the Procedure:   ACS: no  Non-ACS:    Anginal Classification: CCS III (Crescendo/Unstable Angina)  Anti-ischemic medical therapy: Minimal Therapy (1 class of medications)  Non-Invasive Test Results: No non-invasive testing performed  Prior CABG: No previous CABG   HARDING,DAVID W

## 2013-04-07 NOTE — Progress Notes (Signed)
ANTICOAGULATION CONSULT NOTE - Initial Consult  Pharmacy Consult for Heparin Indication: stroke prevention, CAD-CABG on 7/1  Allergies  Allergen Reactions  . Penicillins     Childhood Reaction    Patient Measurements: Height: 6\' 3"  (190.5 cm) Weight: 245 lb (111.131 kg) IBW/kg (Calculated) : 84.5 Heparin Dosing Weight: 107 kg  Vital Signs: Temp: 98 F (36.7 C) (06/30 0730) Temp src: Oral (06/30 0730) BP: 153/77 mmHg (06/30 0730) Pulse Rate: 72 (06/30 0900)  Labs:  Recent Labs  04/07/13 0735  LABPROT 13.8  INR 1.08    Estimated Creatinine Clearance: 72.6 ml/min (by C-G formula based on Cr of 1.4).   Medical History: Past Medical History  Diagnosis Date  . Invasive ductal carcinoma of breast, stage 2 04/27/2011  . Right carotid artery occlusion 04/27/2011  . Left carotid artery partial occlusion 04/27/2011  . Stroke     2007  . Hypercholesterolemia   . Adie's pupil   . H/O Hodgkin's disease 04/27/2011  . Breast cancer   . Cancer     hx of hodgkins disease  . Thyroid disease     Medications:  Scheduled:  . [START ON 04/08/2013] aminocaproic acid (AMICAR) for OHS   Intravenous To OR  . [START ON 04/08/2013] aspirin EC  162 mg Oral Daily  . atorvastatin  80 mg Oral q1800  . carvedilol  3.125 mg Oral BID WC  . [START ON 04/08/2013] citalopram  40 mg Oral Daily  . [START ON 04/08/2013] dexmedetomidine  0.1-0.7 mcg/kg/hr Intravenous To OR  . [START ON 04/08/2013] DOPamine  2-20 mcg/kg/min Intravenous To OR  . [START ON 04/08/2013] epinephrine  0.5-20 mcg/min Intravenous To OR  . [START ON 04/08/2013] heparin-papaverine-plasmalyte irrigation   Irrigation To OR  . [START ON 04/08/2013] heparin 30,000 units/NS 1000 mL solution for CELLSAVER   Other To OR  . [START ON 04/08/2013] insulin (NOVOLIN-R) infusion   Intravenous To OR  . [START ON 04/08/2013] levofloxacin (LEVAQUIN) IV  500 mg Intravenous To OR  . [START ON 04/08/2013] levothyroxine  50 mcg Oral QAC breakfast  . [START ON  04/08/2013] magnesium sulfate  40 mEq Other To OR  . [START ON 04/08/2013] nitroGLYCERIN  2-200 mcg/min Intravenous To OR  . [START ON 04/08/2013] phenylephrine (NEO-SYNEPHRINE) Adult infusion  30-200 mcg/min Intravenous To OR  . [START ON 04/08/2013] potassium chloride  80 mEq Other To OR  . sodium chloride  3 mL Intravenous Q12H  . tamoxifen  10 mg Oral BID  . [START ON 04/08/2013] vancomycin  1,500 mg Intravenous To OR    Assessment: 62 yr old male on chronic coumadin for stroke prevention, s/p cath- three vessel CAD-plan for CABG in am.  INR=1.08.   Sheath was removed at 1050am today, Heparin to start 8 hours post sheath removal.    Goal of Therapy:  Heparin level 0.3-0.7 units/ml Monitor platelets by anticoagulation protocol: Yes   Plan:  Start IV Heparin at 1250 units/hr at 1900 tonight.  Check heparin level 6 hours after drip start-0100 tomorrow. Daily heparin level/cbc.  Wendie Simmer, PharmD, BCPS Clinical Pharmacist  Pager: [phone]

## 2013-04-07 NOTE — Consult Note (Signed)
[address]       [place] [address]             [phone]        Ryan [name] [facility] Medical Record #[id number] Date of Birth: 1951/04/02  Referring: No ref. provider found Primary Care: Kirk Ruths, MD  Chief Complaint:   No chief complaint on file.  62 year old Caucasian male presents with unstable angina--cardiac catheterization today shows 80% left main stenosis-three-vessel CAD  History of Present Illness:      62 year old obese Caucasian male nonsmoker with progressive dyspnea on exertion chest discomfort and decreasing exercise tolerance over the past several weeks. He is referred for cardiology evaluation and was felt to be high risk for CAD. His risk factors include hypertension, peripheral vascular disease (right carotid occlusion ) and dyslipidemia. He underwent cardiac catheterization today which showed 80% left main CAD with ostial RCA stenosis as well. Surgical coronary revascularization has been recommended.  6 years ago the patient had a left body TIA and was found have right carotid occlusion. Followed by Dr. Edilia Bo. Carotid duplex scan January 2014 showed 50% left carotid stenosis total occlusion right carotid. Patient is asymptomatic and is being followed angle scans at [facility]  25-30 years ago the patient had Hodgkin's disease of his neck and had mediastinal radiation. Followed by Dr. Laurie Panda  Last year the patient had right breast cancer stage II treated with modified mastectomy and chemotherapy. Axillary nodes negative Currently on hormonal therapy with tamoxifen-ER positive tumor   Current Activity/ Functional Status: Patient is fairly sedentary Patient is retired from working as a Naval architect No regular exercise in his daily schedule   Zubrod Score: At the time of surgery this patient's most appropriate activity status/level should be described as: []  Normal activity, no symptoms [x]  Symptoms, fully  ambulatory []  Symptoms, in bed less than or equal to 50% of the time []  Symptoms, in bed greater than 50% of the time but less than 100% []  Bedridden []  Moribund  Past Medical History  Diagnosis Date  . Invasive ductal carcinoma of breast, stage 2 04/27/2011  . Right carotid artery occlusion 04/27/2011  . Left carotid artery partial occlusion 04/27/2011  . Stroke     2007  . Hypercholesterolemia   . Adie's pupil   . H/O Hodgkin's disease 04/27/2011  . Breast cancer   . Cancer     hx of hodgkins disease  . Thyroid disease     Past Surgical History  Procedure Laterality Date  . Mastectomy  rt  . Lymph node biopsy      left neck    History  Smoking status  . Former Smoker  . Types: Cigarettes  . Quit date: 10/09/1981  Smokeless tobacco  . Never Used    History  Alcohol Use No    History   Social History  . Marital Status: Married    Spouse Name: [name]    Number of Children: [name]  . Years of Education: [name]   Occupational History  . Not on file.   Social History Main Topics  . Smoking status: Former Smoker    Types: Cigarettes    Quit date: 10/09/1981  . Smokeless tobacco: Never Used  . Alcohol Use: No  . Drug Use: No  . Sexually Active:    Other Topics Concern  . Not on file   Social History Narrative  . No narrative on file    No Known Allergies  Current Facility-Administered Medications  Medication Dose Route Frequency Provider Last Rate Last Dose  . 0.9 %  sodium chloride infusion  1 mL/kg/hr Intravenous Continuous Marykay Lex, MD      . bisacodyl (DULCOLAX) EC tablet 5 mg  5 mg Oral Once Kerin Perna, MD      . chlorhexidine (HIBICLENS) 4 % liquid 4 application  60 mL Topical Once Kerin Perna, MD      . Melene Muller ON 04/08/2013] chlorhexidine (HIBICLENS) 4 % liquid 4 application  60 mL Topical Once Kerin Perna, MD      . diazepam (VALIUM) tablet 5-10 mg  5-10 mg Oral Q4H PRN Kerin Perna, MD      . Melene Muller ON 04/08/2013] metoprolol  tartrate (LOPRESSOR) tablet 12.5 mg  12.5 mg Oral Once Kerin Perna, MD      . sodium chloride 0.9 % injection 3 mL  3 mL Intravenous Q12H Marykay Lex, MD      . temazepam (RESTORIL) capsule 15 mg  15 mg Oral Once PRN Kerin Perna, MD        Facility-administered medications prior to admission  Medication Dose Route Frequency Provider Last Rate Last Dose  . 0.9 %  sodium chloride infusion   Intravenous Continuous Marykay Lex, MD       Prescriptions prior to admission  Medication Sig Dispense Refill  . ALPRAZolam (XANAX) 1 MG tablet Take 1 mg by mouth at bedtime as needed for anxiety. Take 0.5 mg by mouth at bedtime as needed for anxiety      . aspirin EC 81 MG tablet Take 162 mg by mouth daily.       . carvedilol (COREG) 3.125 MG tablet Take 1 tablet (3.125 mg total) by mouth 2 (two) times daily with a meal.  90 tablet  3  . citalopram (CELEXA) 40 MG tablet Take 40 mg by mouth daily.       . Coconut Oil 1000 MG CAPS Take 1,000 mg by mouth daily.      . Garlic 500 MG CAPS Take 500 mg by mouth daily.      . Ginkgo Biloba 60 MG CAPS Take 1 capsule by mouth daily.      Marland Kitchen levothyroxine (SYNTHROID, LEVOTHROID) 50 MCG tablet Take 50 mcg by mouth daily.      . nitroGLYCERIN (NITROSTAT) 0.4 MG SL tablet Place 1 tablet (0.4 mg total) under the tongue every 5 (five) minutes as needed for chest pain.  25 tablet  3  . Omega-3 Fatty Acids (FISH OIL) 1000 MG CPDR Take 1 each by mouth daily.      . simvastatin (ZOCOR) 40 MG tablet Take 40 mg by mouth at bedtime.       . tamoxifen (NOLVADEX) 10 MG tablet Take 10 mg by mouth 2 (two) times daily.      Marland Kitchen warfarin (COUMADIN) 2 MG tablet Take 2 mg by mouth See admin instructions. Day 1: 4 mg, Day 2: 4 mg, Day 3: 5 mg, Day 4: 4 mg Day 5: 4 mg Day 6: 5 mg Day 7: 4 mg      . warfarin (COUMADIN) 5 MG tablet Take 5 mg by mouth See admin instructions. Day 1: 4 mg, Day 2: 4 mg, Day 3: 5 mg, Day 4: 4 mg, Day 5: 4 mg, Day 6: 5 mg, Day 7: 4 mg.         Family History  Problem Relation Age of Onset  .  Alzheimer's disease Mother   . Stroke Mother   . Hypertension Mother   . Hypertension Father   . Hyperlipidemia Mother   . Diabetes Mother   . Diabetes Paternal Grandmother   . Diabetes Paternal Grandfather      Review of Systems:   Patient takes Coumadin daily-4 mg-because of right carotid occlusion and risk of thrombus propagation. No bleeding complications from Coumadin.  Patient is right-hand dominant-no IVs and right arm due to 2 previous right mastectomy For cancer  Patient has no history of thoracic trauma or pneumothorax or rib fracture.  Patient has a easily reducible umbilical hernia-2 cm defect    Cardiac Review of Systems: Y or N  Chest Pain [ Y.   ]  Resting SOB [ N.  ] Exertional SOB  [Y.  ]  Orthopnea [ and ]   Pedal Edema [ and  ]    Palpitations [Y.  ] Syncope  [  ]   Presyncope [Y.   ]  General Review of Systems: [Y] = yes [  ]=no Constitional: recent weight change [  ]; anorexia [  ]; fatigue [  ]; nausea [  ]; night sweats [  ]; fever [  ]; or chills [  ]                                                               Dental: poor dentition[  ]; Last Dentist visit one year:   Eye : blurred vision [  ]; diplopia [   ]; vision changes [  ];  Amaurosis fugax[  ]; Resp: cough [  ];  wheezing[  ];  hemoptysis[  ]; shortness of breath[Y.  ]; paroxysmal nocturnal dyspnea[  ]; dyspnea on exertion[  ]; or orthopnea[  ];  GI:  gallstones[  ], vomiting[  ];  dysphagia[  ]; melena[  ];  hematochezia [  ]; heartburn[  ];   Hx of  Colonoscopy[  ]; GU: kidney stones [  ]; hematuria[  ];   dysuria [  ];  nocturia[  ];  history of     obstruction [  ]; urinary frequency [  ]             Skin: rash, swelling[  ];, hair loss[  ];  peripheral edema[  ];  or itching[  ]; Musculosketetal: myalgias[  ];  joint swelling[  ];  joint erythema[  ];  joint pain[  ];  back pain[  ];  Heme/Lymph: bruising[  ];  bleeding[  ];  anemia[  ];   Neuro: TIA[  ];  headaches[  ];  stroke[  ];  vertigo[  ];  seizures[  ];   paresthesias[  ];  difficulty walking[  ];  Psych:depression[  ]; anxiety[  ];  Endocrine: diabetes[  ];  thyroid dysfunction[  ];  Immunizations: Flu [  ]; Pneumococcal[  ];  Other:  Physical Exam: BP 153/77  Pulse 72  Temp(Src) 98 F (36.7 C) (Oral)  Resp 20  Ht 6\' 3"  (1.905 m)  Wt 245 lb (111.131 kg)  BMI 30.62 kg/m2  SpO2 100%  Physical exam General appearance-middle-aged obese Caucasian male in cath lab holding area no distress HEENT-normocephalic pupils equal no JVD mass or bruit Lymphatic-  no palpable cervical or supraclavicular adenopathy Thorax-well-healed right mastectomy scar, breath sounds clear and equal, skin tattoos over anterior chest from mediastinal radiation COR-regular rhythm without murmur rub or gallop Abdomen-soft nontender without pulsatile mass Extremities-no clubbing cyanosis edema or tenderness Vascular-good pulses in all extremities, negative Allen test on left, no venous insufficiency noted in the lower extremities Neurologic-no focal motor deficit, patient currently on bedrest following cardiac catheterization     Laboratory data:     Recent Radiology Findings:   No results found.   chest x-ray pending   prior CT scan of chest shows stable sub-centimeter nodule right lower lobe  Recent Lab Findings: Lab Results  Component Value Date   WBC 11.1* 04/02/2013   HGB 14.1 04/02/2013   HCT 40.5 04/02/2013   PLT 319 04/02/2013   GLUCOSE 81 04/02/2013   CHOL 189 03/22/2011   TRIG 194* 03/22/2011   HDL 38* 03/22/2011   LDLCALC 112* 03/22/2011   ALT 19 02/29/2012   AST 24 02/29/2012   NA 141 04/02/2013   K 4.5 04/02/2013   CL 103 04/02/2013   CREATININE 1.40* 04/02/2013   BUN 13 04/02/2013   CO2 27 04/02/2013   TSH 3.706 04/02/2013   INR 1.08 04/07/2013      Assessment / Plan:      symptomatic left main and three-vessel CAD    history of peripheral vascular disease-occluded  right ICA   history of previous malignancy-Hodgkin's disease of neck and mediastinum treated with radiation and right breast cancer treated with modified mastectomy and chemotherapy  Plan multivessel CABG in a.m. We'll plan on grafting LAD, ramus, RCA and possible OM. We'll harvest left radial artery in case left IMA is abnormal from previous radiation. Plan endoscopic vein harvest. Procedure indications benefits and risks discussed with patient and wife.      @ME1 @ 04/07/2013 3:17 PM

## 2013-04-07 NOTE — CV Procedure (Signed)
CARDIAC  CATHETERIZATION REPORT  NAME:  LACHARLES ALTSCHULER   MRN: [id number] DOB:  1951-06-09   ADMIT DATE: 04/07/2013 Procedure Date: 04/07/2013  INTERVENTIONAL CARDIOLOGIST: Marykay Lex, M.D., MS PRIMARY CARE PROVIDER: Kirk Ruths, MD PRIMARY CARDIOLOGIST: Marykay Lex, M.D., MS  PATIENT:  Ryan [name] is a 62 y.o. male with a history of Hodgkin's lymphoma treated with radiation as well as recent breast cancer treated with mastectomy chemotherapy. He also TIA with right internal carotid artery stenosis. Came to my office complaining of several months of worsening dyspnea and chest tightness on exertion. The symptoms are concerning for at least Class III Angina if if not even Unstable/Crescendo Angina. Given his known carotid artery stenosis and other risk factors, I felt the most expeditious way to evaluate this is with cardiac catheterization and avoid noninvasive testings based on my high pretest probability of significant coronary disease  PRE-OPERATIVE DIAGNOSIS:    Class 3/ Unstable angina  PROCEDURES PERFORMED:    Left Heart Catheterization with the Native Coronary Angiography  PROCEDURE:Consent:  Risks of procedure as well as the alternatives and risks of each were explained to the (patient/caregiver).  Consent for procedure obtained. Consent for signed by MD and patient with RN witness -- placed on chart.   PROCEDURE: The patient was brought to the 2nd Floor [facility] in the fasting state and prepped and draped in the usual sterile fashion for Right groin or radial access. A modified Allen's test with plethysmography was performed, revealing excellent Ulnar artery collateral flow.  Sterile technique was used including antiseptics, cap, gloves, gown, hand hygiene, mask and sheet.  Skin prep: Chlorhexidine.  Time Out: Verified patient identification, verified procedure, site/side was marked, verified correct patient position, special  equipment/implants available, medications/allergies/relevent history reviewed, required imaging and test results available.  Performed  Access:  Right Common Femoral  Artery; 6 Fr Sheath -- fluoroscopically guided modified Seldinger technique  - using a Versicore wire  Diagnostic:  JL 4, JR 4, Angled Pigtail catheters advanced over and exchanged over the J-wire.  Left Coronary Artery Angiography: JL4  Right Coronary Artery Angiography: JR 4  LV Hemodynamics (LV Gram): Angled pigtail  Sheath to be removed in the holding area.  MEDICATIONS:  Anesthesia:  Local Lidocaine 16 ml  Sedation:  1 mg IV Versed, 25 mcg IV fentanyl ;   Premedication: 5 mg Valium  Omnipaque Contrast: 80 ml  Anti-Platelet Agent:  None  Intracoronary Nitroglycerin injection to the RCA -- 200 mcg  Hemodynamics:  Central Aortic / Mean Pressures: 128/69 mmHg; 93 mmHg  Left Ventricular Pressures / EDP: 130/8 mmHg; 18 mmHg  Left Ventriculography:  EF: 60 %  Wall Motion: Possible distal anterolateral hypokinesis  Coronary Anatomy:  Left Main: Large (3 mm) vessel with distal irregular 80% stenosis prior to branching into the LAD, Circumflex, and 2 Ramus Intermedius vessels LAD: Moderate large-caliber vessel that courses down to the apex. It gives rise to a major diagonal branch. The vessel itself tapers down to the apex and is a roughly 2 mm vessel the distal and. Otherwise angiographically normal.  Left Circumflex: Moderate large-caliber vessel that courses in the AV groove and gives rise to 2 small OM branches before bifurcating distally in the AV groove into a left posterior lateral branch and the terminal AV groove circumflex. Angiographically normal.  OM1: Very small caliber, and significant  OM 2: Small to moderate caliber vessel. Angiographically normal  Ramus intermedius: The major branch year is at least  as big as the circumflex. This is somewhat circuitous route that courses as an obtuse  marginal that vessel. It bifurcates distally in reaches the inferoapex. Angiographically normal  There is also a second smaller ramus intermedius which courses more like a diagonal vessel. This is a roughly 1 mm vessel   RCA: Moderate caliber vessel with proximal 60-70% stenosis that is enough to cause damping of over 80 mmHg with catheter engagement. No contrast blush back with engaging. A disengaged imaging showed the stenosis. Beyond this the vessel is relatively normal with exception of a 20% eccentric lesion prior to bifurcating into the right PDA and the  Right Posterior AV Groove Branch (RPAV) that are both small in caliber. There is 1 major and 1 minor posterolateral branch.   Left Internal Mammary Artery: Widely patent coursing down the chest wall. Excellent conduit.  PATIENT DISPOSITION:    The patient was transferred to the PACU holding area in a hemodynamicaly stable, chest pain free condition.  The patient tolerated the procedure well, and there were no complications.  EBL:   < 20 ml  The patient was stable before, during, and after the procedure.  POST-OPERATIVE DIAGNOSIS:    Severe Left Main and Proximal RCA disease with minimal distal disease.  Normal EF with possible distal anterolateral hypokinesis  Normal LVEDP  PLAN OF CARE:  We'll admit for a CT surgical consult. Based on the irregularity of this lesion not component discharging him.  We'll initiate IV heparin 8 hours post sheath removal.  Continue beta blocker and statin.  He will likely also need Vascular Surgery consultation given his carotid disease.  Will check a 2-D echocardiogram to verify EF and any valve issues.   Marykay Lex, M.D., M.S. [facility] [address] [place], [place]  [address]  [phone]  04/07/2013 10:13 AM

## 2013-04-07 NOTE — Care Management Note (Unsigned)
    Page 1 of 1   04/15/2013     3:39:26 PM   CARE MANAGEMENT NOTE 04/15/2013  Patient:  Ryan [name], Ryan [name]   Account Number:  [id number]  Date Initiated:  04/07/2013  Documentation initiated by:  Junius Creamer  Subjective/Objective Assessment:   adm w post cath, angina, left main dis, for cabg 7-1     Action/Plan:   lives w wife, pcp dr Ryan [name]   Anticipated DC Date:  04/15/2013   Anticipated DC Plan:  HOME W HOME HEALTH SERVICES      DC Planning Services  CM consult      Choice offered to / List presented to:             Status of service:  In process, will continue to follow Medicare Important Message given?   (If response is "NO", the following Medicare IM given date fields will be blank) Date Medicare IM given:   Date Additional Medicare IM given:    Discharge Disposition:    Per UR Regulation:  Reviewed for med. necessity/level of care/duration of stay  If discussed at Long Length of Stay Meetings, dates discussed:    Comments:  Contact:  Ryan [name] Spouse [phone]  04/15/13 Ryan Hickok,RN,BSN [phone] PT PROGRESSING WELL; WIFE TO PROVIDE 24HR CARE AT DC.  PT HAS RW AT HOME, IF NEEDED.  WILL FOLLOW PROGRESS.

## 2013-04-08 ENCOUNTER — Inpatient Hospital Stay (HOSPITAL_COMMUNITY): Payer: BC Managed Care – PPO

## 2013-04-08 ENCOUNTER — Encounter (HOSPITAL_COMMUNITY): Payer: Self-pay | Admitting: Certified Registered Nurse Anesthetist

## 2013-04-08 ENCOUNTER — Encounter (HOSPITAL_COMMUNITY): Payer: Self-pay | Admitting: *Deleted

## 2013-04-08 ENCOUNTER — Inpatient Hospital Stay (HOSPITAL_COMMUNITY): Payer: BC Managed Care – PPO | Admitting: Certified Registered Nurse Anesthetist

## 2013-04-08 ENCOUNTER — Other Ambulatory Visit (HOSPITAL_COMMUNITY): Payer: BC Managed Care – PPO

## 2013-04-08 ENCOUNTER — Encounter (HOSPITAL_COMMUNITY)
Admission: RE | Disposition: A | Discharge status: Home / Self Care | Payer: Self-pay | Source: Ambulatory Visit | Attending: Cardiothoracic Surgery

## 2013-04-08 DIAGNOSIS — I2510 Atherosclerotic heart disease of native coronary artery without angina pectoris: Secondary | ICD-10-CM

## 2013-04-08 HISTORY — PX: RADIAL ARTERY HARVEST: SHX5067

## 2013-04-08 HISTORY — PX: CORONARY ARTERY BYPASS GRAFT: SHX141

## 2013-04-08 HISTORY — PX: INTRAOPERATIVE TRANSESOPHAGEAL ECHOCARDIOGRAM: SHX5062

## 2013-04-08 LAB — POCT I-STAT 3, ART BLOOD GAS (G3+)
Acid-Base Excess: 1 mmol/L (ref 0.0–2.0)
Acid-base deficit: 2 mmol/L (ref 0.0–2.0)
Acid-base deficit: 14 mmol/L — ABNORMAL HIGH (ref 0.0–2.0)
Bicarbonate: 23.4 mEq/L (ref 20.0–24.0)
Bicarbonate: 25 mEq/L — ABNORMAL HIGH (ref 20.0–24.0)
Bicarbonate: 25.8 mEq/L — ABNORMAL HIGH (ref 20.0–24.0)
O2 Saturation: 95 %
Patient temperature: 37.8
Patient temperature: 97.9
TCO2: 25 mmol/L (ref 0–100)
TCO2: 26 mmol/L (ref 0–100)
pCO2 arterial: 37.3 mmHg (ref 35.0–45.0)
pCO2 arterial: 35.8 mmHg (ref 35.0–45.0)
pCO2 arterial: 41 mmHg (ref 35.0–45.0)
pCO2 arterial: 29.9 mmHg — ABNORMAL LOW (ref 35.0–45.0)
pH, Arterial: 7.3 — ABNORMAL LOW (ref 7.350–7.450)
pH, Arterial: 7.423 (ref 7.350–7.450)
pH, Arterial: 7.433 (ref 7.350–7.450)
pO2, Arterial: 137 mmHg — ABNORMAL HIGH (ref 80.0–100.0)
pO2, Arterial: 148 mmHg — ABNORMAL HIGH (ref 80.0–100.0)
pO2, Arterial: 77 mmHg — ABNORMAL LOW (ref 80.0–100.0)

## 2013-04-08 LAB — CBC
HCT: 28.2 % — ABNORMAL LOW (ref 39.0–52.0)
HCT: 27.6 % — ABNORMAL LOW (ref 39.0–52.0)
Hemoglobin: 9.3 g/dL — ABNORMAL LOW (ref 13.0–17.0)
MCH: 30.3 pg (ref 26.0–34.0)
MCHC: 33.7 g/dL (ref 30.0–36.0)
MCV: 90.1 fL (ref 78.0–100.0)
MCV: 89.9 fL (ref 78.0–100.0)
Platelets: 210 10*3/uL (ref 150–400)
Platelets: 122 10*3/uL — ABNORMAL LOW (ref 150–400)
Platelets: 188 10*3/uL (ref 150–400)
RBC: 3.19 MIL/uL — ABNORMAL LOW (ref 4.22–5.81)
RBC: 3.07 MIL/uL — ABNORMAL LOW (ref 4.22–5.81)
RDW: 13.5 % (ref 11.5–15.5)
RDW: 13.2 % (ref 11.5–15.5)
RDW: 13.6 % (ref 11.5–15.5)
WBC: 10.2 10*3/uL (ref 4.0–10.5)
WBC: 11 10*3/uL — ABNORMAL HIGH (ref 4.0–10.5)
WBC: 17.6 10*3/uL — ABNORMAL HIGH (ref 4.0–10.5)

## 2013-04-08 LAB — POCT I-STAT 4, (NA,K, GLUC, HGB,HCT)
Glucose, Bld: 103 mg/dL — ABNORMAL HIGH (ref 70–99)
Glucose, Bld: 116 mg/dL — ABNORMAL HIGH (ref 70–99)
Glucose, Bld: 133 mg/dL — ABNORMAL HIGH (ref 70–99)
Glucose, Bld: 122 mg/dL — ABNORMAL HIGH (ref 70–99)
HCT: 32 % — ABNORMAL LOW (ref 39.0–52.0)
HCT: 27 % — ABNORMAL LOW (ref 39.0–52.0)
Hemoglobin: 10.9 g/dL — ABNORMAL LOW (ref 13.0–17.0)
Hemoglobin: 8.2 g/dL — ABNORMAL LOW (ref 13.0–17.0)
Potassium: 4.5 mEq/L (ref 3.5–5.1)
Potassium: 5.1 mEq/L (ref 3.5–5.1)
Sodium: 135 mEq/L (ref 135–145)
Sodium: 138 mEq/L (ref 135–145)
Sodium: 138 mEq/L (ref 135–145)
Sodium: 142 mEq/L (ref 135–145)

## 2013-04-08 LAB — URINALYSIS, ROUTINE W REFLEX MICROSCOPIC
Bilirubin Urine: NEGATIVE
Glucose, UA: NEGATIVE mg/dL
Hgb urine dipstick: NEGATIVE
Ketones, ur: NEGATIVE mg/dL
Leukocytes, UA: NEGATIVE
Nitrite: NEGATIVE
Protein, ur: NEGATIVE mg/dL
Specific Gravity, Urine: 1.02 (ref 1.005–1.030)
Urobilinogen, UA: 0.2 mg/dL (ref 0.0–1.0)
pH: 5.5 (ref 5.0–8.0)

## 2013-04-08 LAB — COMPREHENSIVE METABOLIC PANEL
ALT: 16 U/L (ref 0–53)
AST: 19 U/L (ref 0–37)
Albumin: 3.1 g/dL — ABNORMAL LOW (ref 3.5–5.2)
Alkaline Phosphatase: 38 U/L — ABNORMAL LOW (ref 39–117)
BUN: 16 mg/dL (ref 6–23)
CO2: 24 mEq/L (ref 19–32)
Calcium: 8.3 mg/dL — ABNORMAL LOW (ref 8.4–10.5)
Chloride: 103 mEq/L (ref 96–112)
Creatinine, Ser: 1.18 mg/dL (ref 0.50–1.35)
GFR calc Af Amer: 74 mL/min — ABNORMAL LOW (ref >90)
GFR calc non Af Amer: 64 mL/min — ABNORMAL LOW (ref >90)
Glucose, Bld: 101 mg/dL — ABNORMAL HIGH (ref 70–99)
Potassium: 4.1 mEq/L (ref 3.5–5.1)
Sodium: 138 mEq/L (ref 135–145)
Total Bilirubin: 0.4 mg/dL (ref 0.3–1.2)
Total Protein: 5.8 g/dL — ABNORMAL LOW (ref 6.0–8.3)

## 2013-04-08 LAB — CREATININE, SERUM
Creatinine, Ser: 1.35 mg/dL (ref 0.50–1.35)
GFR calc Af Amer: 63 mL/min — ABNORMAL LOW (ref >90)
GFR calc non Af Amer: 54 mL/min — ABNORMAL LOW (ref >90)

## 2013-04-08 LAB — GLUCOSE, CAPILLARY: Glucose-Capillary: 121 mg/dL — ABNORMAL HIGH (ref 70–99)

## 2013-04-08 LAB — HEMOGLOBIN A1C
Hgb A1c MFr Bld: 5.5 % (ref <5.7)
Mean Plasma Glucose: 111 mg/dL (ref <117)

## 2013-04-08 LAB — HEMOGLOBIN AND HEMATOCRIT, BLOOD
HCT: 24.7 % — ABNORMAL LOW (ref 39.0–52.0)
Hemoglobin: 8.4 g/dL — ABNORMAL LOW (ref 13.0–17.0)

## 2013-04-08 LAB — PLATELET COUNT: Platelets: 125 10*3/uL — ABNORMAL LOW (ref 150–400)

## 2013-04-08 LAB — MAGNESIUM: Magnesium: 3.1 mg/dL — ABNORMAL HIGH (ref 1.5–2.5)

## 2013-04-08 LAB — CARBOXYHEMOGLOBIN
Carboxyhemoglobin: 1.3 % (ref 0.5–1.5)
Methemoglobin: 1.2 % (ref 0.0–1.5)
O2 Saturation: 69.2 %
Total hemoglobin: 8.7 g/dL — ABNORMAL LOW (ref 13.5–18.0)

## 2013-04-08 LAB — POCT I-STAT, CHEM 8
Chloride: 108 mEq/L (ref 96–112)
Creatinine, Ser: 1.4 mg/dL — ABNORMAL HIGH (ref 0.50–1.35)
Glucose, Bld: 144 mg/dL — ABNORMAL HIGH (ref 70–99)
Potassium: 5.1 mEq/L (ref 3.5–5.1)

## 2013-04-08 LAB — PROTIME-INR: INR: 1.8 — ABNORMAL HIGH (ref 0.00–1.49)

## 2013-04-08 LAB — APTT: aPTT: 35 seconds (ref 24–37)

## 2013-04-08 LAB — PREPARE RBC (CROSSMATCH)

## 2013-04-08 LAB — ABO/RH: ABO/RH(D): A POS

## 2013-04-08 SURGERY — CORONARY ARTERY BYPASS GRAFTING (CABG)
Anesthesia: General | Site: Chest | Wound class: Clean

## 2013-04-08 MED ORDER — ASPIRIN 81 MG PO CHEW
324.0000 mg | CHEWABLE_TABLET | Freq: Every day | ORAL | Status: DC
  Filled 2013-04-08: qty 1

## 2013-04-08 MED ORDER — SODIUM CHLORIDE 0.9 % IV SOLN: 250.0000 mL | INTRAVENOUS | Status: DC

## 2013-04-08 MED ORDER — SODIUM BICARBONATE 8.4 % IV SOLN
100.0000 meq | Freq: Once | INTRAVENOUS | Status: AC
  Administered 2013-04-08: 50 meq via INTRAVENOUS

## 2013-04-08 MED ORDER — ACETAMINOPHEN 10 MG/ML IV SOLN
1000.0000 mg | Freq: Once | INTRAVENOUS | Status: AC
  Administered 2013-04-08: 1000 mg via INTRAVENOUS
  Filled 2013-04-08: qty 100

## 2013-04-08 MED ORDER — SODIUM CHLORIDE 0.9 % IJ SOLN
OROMUCOSAL | Status: DC | PRN
  Administered 2013-04-08 (×3): via TOPICAL

## 2013-04-08 MED ORDER — NITROGLYCERIN IN D5W 200-5 MCG/ML-% IV SOLN: 0.0000 ug/min | INTRAVENOUS | Status: DC

## 2013-04-08 MED ORDER — MORPHINE SULFATE 2 MG/ML IJ SOLN: 1.0000 mg | INTRAMUSCULAR | Status: AC | PRN

## 2013-04-08 MED ORDER — MIDAZOLAM HCL 2 MG/2ML IJ SOLN: 2.0000 mg | INTRAMUSCULAR | Status: DC | PRN

## 2013-04-08 MED ORDER — LACTATED RINGERS IV SOLN
INTRAVENOUS | Status: DC | PRN
  Administered 2013-04-08 (×3): via INTRAVENOUS

## 2013-04-08 MED ORDER — ASPIRIN EC 325 MG PO TBEC
325.0000 mg | DELAYED_RELEASE_TABLET | Freq: Every day | ORAL | Status: DC
  Administered 2013-04-09 – 2013-04-15 (×7): 325 mg via ORAL
  Filled 2013-04-08 (×8): qty 1

## 2013-04-08 MED ORDER — NOREPINEPHRINE BITARTRATE 1 MG/ML IJ SOLN
2.0000 ug/min | INTRAVENOUS | Status: DC
  Administered 2013-04-08: 5 ug/min via INTRAVENOUS
  Administered 2013-04-09: 1 ug/min via INTRAVENOUS
  Filled 2013-04-08: qty 4

## 2013-04-08 MED ORDER — SODIUM BICARBONATE 8.4 % IV SOLN
INTRAVENOUS | Status: AC
  Administered 2013-04-08: 50 meq
  Filled 2013-04-08: qty 100

## 2013-04-08 MED ORDER — ALBUMIN HUMAN 5 % IV SOLN
25.0000 g | Freq: Once | INTRAVENOUS | Status: AC
  Administered 2013-04-08: 25 g via INTRAVENOUS
  Filled 2013-04-08: qty 500

## 2013-04-08 MED ORDER — LACTATED RINGERS IV SOLN: INTRAVENOUS | Status: DC

## 2013-04-08 MED ORDER — DOPAMINE-DEXTROSE 3.2-5 MG/ML-% IV SOLN
0.0000 ug/kg/min | INTRAVENOUS | Status: DC
  Filled 2013-04-08: qty 250

## 2013-04-08 MED ORDER — LACTATED RINGERS IV SOLN
INTRAVENOUS | Status: DC | PRN
  Administered 2013-04-08 (×2): via INTRAVENOUS

## 2013-04-08 MED ORDER — BISACODYL 5 MG PO TBEC
10.0000 mg | DELAYED_RELEASE_TABLET | Freq: Every day | ORAL | Status: DC
  Administered 2013-04-09 – 2013-04-10 (×2): 10 mg via ORAL
  Filled 2013-04-08 (×2): qty 2

## 2013-04-08 MED ORDER — PROPOFOL 10 MG/ML IV BOLUS
INTRAVENOUS | Status: DC | PRN
  Administered 2013-04-08: 110 mg via INTRAVENOUS

## 2013-04-08 MED ORDER — 0.9 % SODIUM CHLORIDE (POUR BTL) OPTIME
TOPICAL | Status: DC | PRN
  Administered 2013-04-08: 6000 mL

## 2013-04-08 MED ORDER — LEVOTHYROXINE SODIUM 50 MCG PO TABS
50.0000 ug | ORAL_TABLET | Freq: Every day | ORAL | Status: DC
  Administered 2013-04-09 – 2013-04-16 (×8): 50 ug via ORAL
  Filled 2013-04-08 (×10): qty 1

## 2013-04-08 MED ORDER — ACETAMINOPHEN 500 MG PO TABS
1000.0000 mg | ORAL_TABLET | Freq: Four times a day (QID) | ORAL | Status: DC
  Administered 2013-04-09 – 2013-04-10 (×4): 1000 mg via ORAL
  Filled 2013-04-08 (×8): qty 2

## 2013-04-08 MED ORDER — ALBUMIN HUMAN 5 % IV SOLN
250.0000 mL | INTRAVENOUS | Status: AC | PRN
  Administered 2013-04-08 (×4): 250 mL via INTRAVENOUS
  Filled 2013-04-08: qty 500

## 2013-04-08 MED ORDER — ALBUMIN HUMAN 5 % IV SOLN
INTRAVENOUS | Status: DC | PRN
  Administered 2013-04-08 (×3): via INTRAVENOUS

## 2013-04-08 MED ORDER — LACTATED RINGERS IV SOLN
INTRAVENOUS | Status: DC | PRN
  Administered 2013-04-08: 08:00:00 via INTRAVENOUS

## 2013-04-08 MED ORDER — SODIUM CHLORIDE 0.9 % IV SOLN: INTRAVENOUS | Status: DC

## 2013-04-08 MED ORDER — LEVOFLOXACIN IN D5W 500 MG/100ML IV SOLN
500.0000 mg | Freq: Once | INTRAVENOUS | Status: AC
  Administered 2013-04-09: 500 mg via INTRAVENOUS
  Filled 2013-04-08: qty 100

## 2013-04-08 MED ORDER — POTASSIUM CHLORIDE 10 MEQ/50ML IV SOLN
10.0000 meq | INTRAVENOUS | Status: AC
  Administered 2013-04-08 (×2): 10 meq via INTRAVENOUS

## 2013-04-08 MED ORDER — METOPROLOL TARTRATE 1 MG/ML IV SOLN: 2.5000 mg | INTRAVENOUS | Status: DC | PRN

## 2013-04-08 MED ORDER — ARTIFICIAL TEARS OP OINT
TOPICAL_OINTMENT | OPHTHALMIC | Status: DC | PRN
  Administered 2013-04-08: 1 via OPHTHALMIC

## 2013-04-08 MED ORDER — DEXMEDETOMIDINE HCL IN NACL 200 MCG/50ML IV SOLN
0.1000 ug/kg/h | INTRAVENOUS | Status: DC
  Administered 2013-04-08 – 2013-04-09 (×2): 0.5 ug/kg/h via INTRAVENOUS
  Filled 2013-04-08 (×2): qty 50

## 2013-04-08 MED ORDER — CITALOPRAM HYDROBROMIDE 40 MG PO TABS
40.0000 mg | ORAL_TABLET | Freq: Every day | ORAL | Status: DC
  Administered 2013-04-09 – 2013-04-16 (×8): 40 mg via ORAL
  Filled 2013-04-08 (×8): qty 1

## 2013-04-08 MED ORDER — ROCURONIUM BROMIDE 100 MG/10ML IV SOLN
INTRAVENOUS | Status: DC | PRN
  Administered 2013-04-08 (×2): 50 mg via INTRAVENOUS

## 2013-04-08 MED ORDER — METOPROLOL TARTRATE 25 MG/10 ML ORAL SUSPENSION
12.5000 mg | Freq: Two times a day (BID) | ORAL | Status: DC
  Administered 2013-04-09: 12.5 mg
  Filled 2013-04-08 (×5): qty 5

## 2013-04-08 MED ORDER — FAMOTIDINE IN NACL 20-0.9 MG/50ML-% IV SOLN
20.0000 mg | Freq: Two times a day (BID) | INTRAVENOUS | Status: AC
  Administered 2013-04-08 – 2013-04-09 (×2): 20 mg via INTRAVENOUS
  Filled 2013-04-08: qty 50

## 2013-04-08 MED ORDER — LACTATED RINGERS IV SOLN: 500.0000 mL | Freq: Once | INTRAVENOUS | Status: AC | PRN

## 2013-04-08 MED ORDER — INSULIN REGULAR BOLUS VIA INFUSION
0.0000 [IU] | Freq: Three times a day (TID) | INTRAVENOUS | Status: DC
  Filled 2013-04-08: qty 10

## 2013-04-08 MED ORDER — ACETAMINOPHEN 160 MG/5ML PO SOLN
975.0000 mg | Freq: Four times a day (QID) | ORAL | Status: DC
  Administered 2013-04-08 – 2013-04-09 (×2): 975 mg
  Filled 2013-04-08 (×2): qty 40.6

## 2013-04-08 MED ORDER — MORPHINE SULFATE 2 MG/ML IJ SOLN
2.0000 mg | INTRAMUSCULAR | Status: DC | PRN
  Administered 2013-04-08: 2 mg via INTRAVENOUS
  Filled 2013-04-08: qty 1

## 2013-04-08 MED ORDER — FENTANYL CITRATE 0.05 MG/ML IJ SOLN
INTRAMUSCULAR | Status: DC | PRN
  Administered 2013-04-08: 450 ug via INTRAVENOUS
  Administered 2013-04-08: 1000 ug via INTRAVENOUS
  Administered 2013-04-08: 250 ug via INTRAVENOUS
  Administered 2013-04-08: 50 ug via INTRAVENOUS
  Administered 2013-04-08: 400 ug via INTRAVENOUS
  Administered 2013-04-08: 100 ug via INTRAVENOUS

## 2013-04-08 MED ORDER — MAGNESIUM SULFATE 40 MG/ML IJ SOLN
4.0000 g | Freq: Once | INTRAMUSCULAR | Status: AC
  Administered 2013-04-08: 4 g via INTRAVENOUS
  Filled 2013-04-08: qty 100

## 2013-04-08 MED ORDER — PANTOPRAZOLE SODIUM 40 MG PO TBEC
40.0000 mg | DELAYED_RELEASE_TABLET | Freq: Every day | ORAL | Status: DC
  Administered 2013-04-10: 40 mg via ORAL
  Filled 2013-04-08: qty 1

## 2013-04-08 MED ORDER — TAMOXIFEN CITRATE 10 MG PO TABS
10.0000 mg | ORAL_TABLET | Freq: Two times a day (BID) | ORAL | Status: DC
  Administered 2013-04-09 – 2013-04-16 (×15): 10 mg via ORAL
  Filled 2013-04-08 (×16): qty 1

## 2013-04-08 MED ORDER — SODIUM CHLORIDE 0.9 % IJ SOLN
10.0000 mL | INTRAMUSCULAR | Status: DC | PRN
  Administered 2013-04-10: 20 mL

## 2013-04-08 MED ORDER — LIDOCAINE HCL (CARDIAC) 20 MG/ML IV SOLN
INTRAVENOUS | Status: DC | PRN
  Administered 2013-04-08: 80 mg via INTRAVENOUS

## 2013-04-08 MED ORDER — PROTAMINE SULFATE 10 MG/ML IV SOLN
INTRAVENOUS | Status: DC | PRN
  Administered 2013-04-08: 360 mg via INTRAVENOUS
  Administered 2013-04-08: 30 mg via INTRAVENOUS

## 2013-04-08 MED ORDER — ONDANSETRON HCL 4 MG/2ML IJ SOLN: 4.0000 mg | Freq: Four times a day (QID) | INTRAMUSCULAR | Status: DC | PRN

## 2013-04-08 MED ORDER — VECURONIUM BROMIDE 10 MG IV SOLR
INTRAVENOUS | Status: DC | PRN
  Administered 2013-04-08: 10 mg via INTRAVENOUS

## 2013-04-08 MED ORDER — BISACODYL 10 MG RE SUPP: 10.0000 mg | Freq: Every day | RECTAL | Status: DC

## 2013-04-08 MED ORDER — SODIUM CHLORIDE 0.45 % IV SOLN
INTRAVENOUS | Status: DC
  Administered 2013-04-08: 15:00:00 via INTRAVENOUS

## 2013-04-08 MED ORDER — OXYCODONE HCL 5 MG PO TABS
5.0000 mg | ORAL_TABLET | ORAL | Status: DC | PRN
  Administered 2013-04-09: 10 mg via ORAL
  Administered 2013-04-10: 5 mg via ORAL
  Filled 2013-04-08: qty 1
  Filled 2013-04-08: qty 2

## 2013-04-08 MED ORDER — SODIUM CHLORIDE 0.9 % IJ SOLN
3.0000 mL | Freq: Two times a day (BID) | INTRAMUSCULAR | Status: DC
  Administered 2013-04-09: 3 mL via INTRAVENOUS

## 2013-04-08 MED ORDER — VANCOMYCIN HCL IN DEXTROSE 1-5 GM/200ML-% IV SOLN
1000.0000 mg | Freq: Once | INTRAVENOUS | Status: AC
  Administered 2013-04-08: 1000 mg via INTRAVENOUS
  Filled 2013-04-08: qty 200

## 2013-04-08 MED ORDER — DEXMEDETOMIDINE HCL IN NACL 400 MCG/100ML IV SOLN
0.1000 ug/kg/h | INTRAVENOUS | Status: DC
  Filled 2013-04-08: qty 100

## 2013-04-08 MED ORDER — AMINOCAPROIC ACID 250 MG/ML IV SOLN
INTRAVENOUS | Status: DC
  Filled 2013-04-08: qty 40

## 2013-04-08 MED ORDER — DOCUSATE SODIUM 100 MG PO CAPS
200.0000 mg | ORAL_CAPSULE | Freq: Every day | ORAL | Status: DC
  Administered 2013-04-09 – 2013-04-10 (×2): 200 mg via ORAL
  Filled 2013-04-08 (×2): qty 2

## 2013-04-08 MED ORDER — DEXTROSE 5 % IV SOLN: 1.5000 g | Freq: Two times a day (BID) | INTRAVENOUS | Status: DC

## 2013-04-08 MED ORDER — PHENYLEPHRINE HCL 10 MG/ML IJ SOLN
0.0000 ug/min | INTRAVENOUS | Status: DC
  Filled 2013-04-08 (×3): qty 2

## 2013-04-08 MED ORDER — MIDAZOLAM HCL 5 MG/5ML IJ SOLN
INTRAMUSCULAR | Status: DC | PRN
  Administered 2013-04-08: 2 mg via INTRAVENOUS
  Administered 2013-04-08: 10 mg via INTRAVENOUS
  Administered 2013-04-08: 8 mg via INTRAVENOUS

## 2013-04-08 MED ORDER — MICROFIBRILLAR COLL HEMOSTAT EX PADS
MEDICATED_PAD | CUTANEOUS | Status: DC | PRN
  Administered 2013-04-08: 1 via TOPICAL

## 2013-04-08 MED ORDER — HEPARIN SODIUM (PORCINE) 1000 UNIT/ML IJ SOLN
INTRAMUSCULAR | Status: DC | PRN
  Administered 2013-04-08: 2000 [IU] via INTRAVENOUS
  Administered 2013-04-08: 43000 [IU] via INTRAVENOUS
  Administered 2013-04-08: 5000 [IU] via INTRAVENOUS

## 2013-04-08 MED ORDER — SODIUM CHLORIDE 0.9 % IJ SOLN: 3.0000 mL | INTRAMUSCULAR | Status: DC | PRN

## 2013-04-08 MED ORDER — ATORVASTATIN CALCIUM 80 MG PO TABS
80.0000 mg | ORAL_TABLET | Freq: Every day | ORAL | Status: DC
  Administered 2013-04-09 – 2013-04-16 (×8): 80 mg via ORAL
  Filled 2013-04-08 (×8): qty 1

## 2013-04-08 MED ORDER — CALCIUM CHLORIDE 10 % IV SOLN
1.0000 g | Freq: Once | INTRAVENOUS | Status: AC
  Administered 2013-04-08: 1 g via INTRAVENOUS

## 2013-04-08 MED ORDER — POTASSIUM CHLORIDE 10 MEQ/50ML IV SOLN
10.0000 meq | Freq: Once | INTRAVENOUS | Status: AC
  Administered 2013-04-08: 10 meq via INTRAVENOUS

## 2013-04-08 MED ORDER — METOPROLOL TARTRATE 12.5 MG HALF TABLET
12.5000 mg | ORAL_TABLET | Freq: Two times a day (BID) | ORAL | Status: DC
  Administered 2013-04-09: 12.5 mg via ORAL
  Filled 2013-04-08 (×5): qty 1

## 2013-04-08 MED ORDER — SODIUM CHLORIDE 0.9 % IV SOLN
INTRAVENOUS | Status: DC
  Administered 2013-04-09: 3.2 [IU]/h via INTRAVENOUS
  Filled 2013-04-08 (×2): qty 1

## 2013-04-08 MED ORDER — METOCLOPRAMIDE HCL 5 MG/ML IJ SOLN
10.0000 mg | Freq: Four times a day (QID) | INTRAMUSCULAR | Status: DC
  Administered 2013-04-08 – 2013-04-10 (×7): 10 mg via INTRAVENOUS
  Filled 2013-04-08 (×8): qty 2

## 2013-04-08 SURGICAL SUPPLY — 119 items
ADAPTER CARDIO PERF ANTE/RETRO (ADAPTER) ×5 IMPLANT
ATTRACTOMAT 16X20 MAGNETIC DRP (DRAPES) ×5 IMPLANT
BAG DECANTER FOR FLEXI CONT (MISCELLANEOUS) ×5 IMPLANT
BANDAGE ELASTIC 4 VELCRO ST LF (GAUZE/BANDAGES/DRESSINGS) ×5 IMPLANT
BANDAGE ELASTIC 6 VELCRO ST LF (GAUZE/BANDAGES/DRESSINGS) ×5 IMPLANT
BANDAGE GAUZE ELAST BULKY 4 IN (GAUZE/BANDAGES/DRESSINGS) ×5 IMPLANT
BASKET HEART  (ORDER IN 25'S) (MISCELLANEOUS) ×1
BASKET HEART (ORDER IN 25'S) (MISCELLANEOUS) ×1
BASKET HEART (ORDER IN 25S) (MISCELLANEOUS) ×3 IMPLANT
BLADE STERNUM SYSTEM 6 (BLADE) ×5 IMPLANT
BLADE SURG 12 STRL SS (BLADE) ×5 IMPLANT
BLADE SURG ROTATE 9660 (MISCELLANEOUS) IMPLANT
CANISTER SUCTION 2500CC (MISCELLANEOUS) ×5 IMPLANT
CANNULA AORTIC HI-FLOW 6.5M20F (CANNULA) ×5 IMPLANT
CANNULA ARTERIAL NVNT 3/8 20FR (MISCELLANEOUS) ×5 IMPLANT
CANNULA ARTERIAL NVNT 3/8 22FR (MISCELLANEOUS) ×5 IMPLANT
CANNULA GUNDRY RCSP 15FR (MISCELLANEOUS) ×10 IMPLANT
CANNULA VENOUS LOW PROF 32X40 (CANNULA) ×5 IMPLANT
CANNULA VENOUS MAL SGL STG 40 (MISCELLANEOUS) IMPLANT
CANNULAE VENOUS MAL SGL STG 40 (MISCELLANEOUS)
CATH CPB KIT VANTRIGT (MISCELLANEOUS) ×5 IMPLANT
CATH ROBINSON RED A/P 18FR (CATHETERS) ×15 IMPLANT
CATH THORACIC 28FR (CATHETERS) IMPLANT
CATH THORACIC 28FR RT ANG (CATHETERS) IMPLANT
CATH THORACIC 36FR (CATHETERS) IMPLANT
CATH THORACIC 36FR RT ANG (CATHETERS) ×10 IMPLANT
CLIP RETRACTION 3.0MM CORONARY (MISCELLANEOUS) ×5 IMPLANT
CLIP TI WIDE RED SMALL 24 (CLIP) IMPLANT
CLOTH BEACON ORANGE TIMEOUT ST (SAFETY) ×5 IMPLANT
COVER SURGICAL LIGHT HANDLE (MISCELLANEOUS) ×5 IMPLANT
CRADLE DONUT ADULT HEAD (MISCELLANEOUS) ×5 IMPLANT
DERMABOND ADVANCED (GAUZE/BANDAGES/DRESSINGS) ×2
DERMABOND ADVANCED .7 DNX12 (GAUZE/BANDAGES/DRESSINGS) ×3 IMPLANT
DRAIN CHANNEL 32F RND 10.7 FF (WOUND CARE) ×5 IMPLANT
DRAPE CARDIOVASCULAR INCISE (DRAPES) ×2
DRAPE SLUSH/WARMER DISC (DRAPES) ×5 IMPLANT
DRAPE SRG 135X102X78XABS (DRAPES) ×3 IMPLANT
DRSG AQUACEL AG ADV 3.5X10 (GAUZE/BANDAGES/DRESSINGS) ×10 IMPLANT
DRSG COVADERM 4X14 (GAUZE/BANDAGES/DRESSINGS) ×5 IMPLANT
ELECT BLADE 4.0 EZ CLEAN MEGAD (MISCELLANEOUS) ×5
ELECT BLADE 6.5 EXT (BLADE) ×5 IMPLANT
ELECT CAUTERY BLADE 6.4 (BLADE) ×5 IMPLANT
ELECT REM PT RETURN 9FT ADLT (ELECTROSURGICAL) ×10
ELECTRODE BLDE 4.0 EZ CLN MEGD (MISCELLANEOUS) ×3 IMPLANT
ELECTRODE REM PT RTRN 9FT ADLT (ELECTROSURGICAL) ×6 IMPLANT
GAUZE XEROFORM 5X9 LF (GAUZE/BANDAGES/DRESSINGS) ×5 IMPLANT
GLOVE BIO SURGEON STRL SZ 6 (GLOVE) ×25 IMPLANT
GLOVE BIO SURGEON STRL SZ 6.5 (GLOVE) ×8 IMPLANT
GLOVE BIO SURGEON STRL SZ7.5 (GLOVE) ×20 IMPLANT
GLOVE BIO SURGEON STRL SZ8.5 (GLOVE) ×5 IMPLANT
GLOVE BIO SURGEONS STRL SZ 6.5 (GLOVE) ×2
GLOVE BIOGEL PI IND STRL 7.0 (GLOVE) ×12 IMPLANT
GLOVE BIOGEL PI INDICATOR 7.0 (GLOVE) ×8
GOWN PREVENTION PLUS XLARGE (GOWN DISPOSABLE) ×10 IMPLANT
GOWN STRL NON-REIN LRG LVL3 (GOWN DISPOSABLE) ×50 IMPLANT
HARMONIC SHEARS 14CM COAG (MISCELLANEOUS) ×5 IMPLANT
HEMOSTAT POWDER SURGIFOAM 1G (HEMOSTASIS) ×15 IMPLANT
HEMOSTAT SURGICEL 2X14 (HEMOSTASIS) ×5 IMPLANT
INSERT FOGARTY XLG (MISCELLANEOUS) ×5 IMPLANT
KIT BASIN OR (CUSTOM PROCEDURE TRAY) ×5 IMPLANT
KIT ROOM TURNOVER OR (KITS) ×5 IMPLANT
KIT SUCTION CATH 14FR (SUCTIONS) ×5 IMPLANT
KIT VASOVIEW W/TROCAR VH 2000 (KITS) ×5 IMPLANT
LEAD PACING MYOCARDI (MISCELLANEOUS) ×5 IMPLANT
MARKER GRAFT CORONARY BYPASS (MISCELLANEOUS) ×15 IMPLANT
NS IRRIG 1000ML POUR BTL (IV SOLUTION) ×25 IMPLANT
PACK OPEN HEART (CUSTOM PROCEDURE TRAY) ×5 IMPLANT
PAD ARMBOARD 7.5X6 YLW CONV (MISCELLANEOUS) ×10 IMPLANT
PAD ELECT DEFIB RADIOL ZOLL (MISCELLANEOUS) ×5 IMPLANT
PENCIL BUTTON HOLSTER BLD 10FT (ELECTRODE) ×5 IMPLANT
PUNCH AORTIC ROTATE 4.0MM (MISCELLANEOUS) ×5 IMPLANT
PUNCH AORTIC ROTATE 4.5MM 8IN (MISCELLANEOUS) IMPLANT
PUNCH AORTIC ROTATE 5MM 8IN (MISCELLANEOUS) IMPLANT
SET CARDIOPLEGIA MPS 5001102 (MISCELLANEOUS) ×5 IMPLANT
SPONGE GAUZE 4X4 12PLY (GAUZE/BANDAGES/DRESSINGS) ×15 IMPLANT
SPONGE LAP 18X18 X RAY DECT (DISPOSABLE) ×10 IMPLANT
SPONGE LAP 4X18 X RAY DECT (DISPOSABLE) ×10 IMPLANT
STAPLER SKIN PROX WIDE 3.9 (STAPLE) ×5 IMPLANT
SUT BONE WAX W31G (SUTURE) ×5 IMPLANT
SUT MNCRL AB 3-0 PS2 18 (SUTURE) ×5 IMPLANT
SUT MNCRL AB 4-0 PS2 18 (SUTURE) IMPLANT
SUT PROLENE 3 0 SH DA (SUTURE) IMPLANT
SUT PROLENE 3 0 SH1 36 (SUTURE) IMPLANT
SUT PROLENE 4 0 RB 1 (SUTURE) ×2
SUT PROLENE 4 0 SH DA (SUTURE) ×5 IMPLANT
SUT PROLENE 4-0 RB1 .5 CRCL 36 (SUTURE) ×3 IMPLANT
SUT PROLENE 5 0 C 1 36 (SUTURE) IMPLANT
SUT PROLENE 6 0 C 1 30 (SUTURE) IMPLANT
SUT PROLENE 7 0 DA (SUTURE) IMPLANT
SUT PROLENE 7.0 RB 3 (SUTURE) ×15 IMPLANT
SUT PROLENE 8 0 BV175 6 (SUTURE) ×5 IMPLANT
SUT PROLENE BLUE 7 0 (SUTURE) ×5 IMPLANT
SUT SILK  1 MH (SUTURE)
SUT SILK 1 MH (SUTURE) IMPLANT
SUT SILK 2 0 SH CR/8 (SUTURE) ×5 IMPLANT
SUT SILK 3 0 SH CR/8 (SUTURE) IMPLANT
SUT STEEL 6MS V (SUTURE) ×10 IMPLANT
SUT STEEL STERNAL CCS#1 18IN (SUTURE) ×5 IMPLANT
SUT STEEL SZ 6 DBL 3X14 BALL (SUTURE) ×5 IMPLANT
SUT VIC AB 1 CTX 18 (SUTURE) ×10 IMPLANT
SUT VIC AB 1 CTX 36 (SUTURE) ×4
SUT VIC AB 1 CTX36XBRD ANBCTR (SUTURE) ×6 IMPLANT
SUT VIC AB 2-0 CT1 27 (SUTURE) ×2
SUT VIC AB 2-0 CT1 TAPERPNT 27 (SUTURE) ×3 IMPLANT
SUT VIC AB 2-0 CTX 27 (SUTURE) IMPLANT
SUT VIC AB 3-0 SH 27 (SUTURE)
SUT VIC AB 3-0 SH 27X BRD (SUTURE) IMPLANT
SUT VIC AB 3-0 X1 27 (SUTURE) IMPLANT
SUT VICRYL 4-0 PS2 18IN ABS (SUTURE) ×5 IMPLANT
SUTURE E-PAK OPEN HEART (SUTURE) ×5 IMPLANT
SYSTEM SAHARA CHEST DRAIN ATS (WOUND CARE) ×5 IMPLANT
TAPE CLOTH SURG 4X10 WHT LF (GAUZE/BANDAGES/DRESSINGS) ×10 IMPLANT
TOWEL OR 17X24 6PK STRL BLUE (TOWEL DISPOSABLE) ×10 IMPLANT
TOWEL OR 17X26 10 PK STRL BLUE (TOWEL DISPOSABLE) ×10 IMPLANT
TRAY FOLEY IC TEMP SENS 14FR (CATHETERS) ×5 IMPLANT
TUBE SUCT INTRACARD DLP 20F (MISCELLANEOUS) ×5 IMPLANT
TUBING INSUFFLATION 10FT LAP (TUBING) ×5 IMPLANT
UNDERPAD 30X30 INCONTINENT (UNDERPADS AND DIAPERS) ×5 IMPLANT
WATER STERILE IRR 1000ML POUR (IV SOLUTION) ×10 IMPLANT

## 2013-04-08 NOTE — CV Procedure (Signed)
Intra-operative Transesophageal Echocardiography Report:  [name] is a 62 year old male with a history of progressive dyspnea on exertion and chest discomfort. He subsequently underwent cardiac catheterization which showed 80% left main and ostial right coronary artery stenoses. He is now scheduled to undergo coronary artery bypass grafting by Dr. Donata Clay. Intraoperative transesophageal echocardiography was requested to evaluate the left and right ventricular function, assess for any valvular pathology, and to serve as a monitor for intraoperative volume status.   The patient was brought to the operating room at [facility] and general anesthesia was induced without difficulty. Following endotracheal intubation and orogastric suctioning, the transesophageal echocardiography probe was inserted into the esophagus without difficulty.  Impression: Pre-bypass Findings:  1. Aortic valve: The aortic valve was trileaflet. There was moderate calcification noted in the region of the commissures between the right and noncoronary cusp and the right and left coronary cusps. The leaflets appear to open adequately. There was trace aortic insufficiency. Continuous-wave Doppler interrogation of the the aortic outflow revealed a peak velocity of 1.5 m/s. A mean gradient of 5 mm of mercury and an aortic valve area of 2.55 cm using the VTI method and 2.71 cm using the V-max method  2. Mitral valve: There was moderate to severe mitral annular calcification. The mitral leaflets appeared to open normally. There was 1+ mitral insufficiency. There were no prolapsing or flail segments noted.  3. Left ventricle: Interrogation of the left ventricle in the transgastric short axis view was unsatisfactory. However good views of the left ventricle were obtained in the mid-esophageal windows.  There were no regional wall motion abnormalities noted. Left ventricular size appeared within normal limits. Ejection  fraction was estimated at 55-60%.  4. Right ventricle: The right ventricular size appeared to be within normal limits and there was good contractility of  the right ventricular free wall and normal appearing right ventricular function  5 Tricuspid valve: Tricuspid valve appeared structurally normal with one plus tricuspid insufficiency.  6. Interatrial septum: Interatrial septum was intact without evidence of patent foramen ovale or atrial septal defect by color Doppler.  7. Left atrium: There was no thrombus noted in the left atrium or left atrial appendage.  8. Ascending aorta: There was moderate calcification noted in the region of the aortic root especially involving the right sinus of Valsalva. There was no aneurysmal dilatation of the aortic root and no effacement of the sinuses of Valsalva. There were no mobile plaques noted within the aortic root.  9. Descending aorta: There was scattered  calcification noted within the wall of the descending aorta. The descending aorta was nonaneurysmal and measured 2.4 cm in diameter  Post-bypass findings:  1 Aortic valve: The aortic valve was unchanged from the pre-bypass study. There was 1+ aortic insufficiency.   2.. Mitral valve: Mitral valve was unchanged from the pre-bypass study with 1+ mitral insufficiency and moderate to severe mitral annular calcification  3. Left ventricle: There appeared to be good left ventricular function noted in the post-bypass period. There were no regional wall motion abnormalities noted  And the ejection fraction was estimated at 55-60%.  4. Right ventricle: There appeared to be in good right ventricular function the post-bypass period. There was good contractility the right ventricular free wall and normal right ventricular size.  5. Tricuspid valve: The tricuspid valve appeared unchanged from the pre-bypass study there was 1+ tricuspid insufficiency.  Kipp Brood, M.D.

## 2013-04-08 NOTE — Progress Notes (Deleted)
  Echocardiogram 2D Echocardiogram has been performed.  Georgian Co 04/08/2013, 9:51 AM

## 2013-04-08 NOTE — Progress Notes (Signed)
T. CTS p.m. Rounds  Patient still on ventilator, breath stacking and anxiety when he wakes up Chest x-ray shows low lung vitamins Blood pressure has been labile but last hematocrit 26%

## 2013-04-08 NOTE — Progress Notes (Signed)
TCTS  The patient was examined and preop studies reviewed. There has been no change from the prior exam and the patient is ready for surgery.  Plan CABG on G [place] today.

## 2013-04-08 NOTE — OR Nursing (Signed)
Procedures as follows:  Right Leg Harvest [phone]  Left Radial Artery Harvest [phone]  CABG started at 1004

## 2013-04-08 NOTE — Anesthesia Postprocedure Evaluation (Signed)
  Anesthesia Post-op Note  Patient: Ryan [name]  Procedure(s) Performed: Procedure(s) with comments: CORONARY ARTERY BYPASS GRAFTING (CABG) ([name]) - Times 3 using left internal mammary artery; endoscopically harvested right saphenous vein and left radial artery INTRAOPERATIVE TRANSESOPHAGEAL ECHOCARDIOGRAM ([name]) RADIAL ARTERY HARVEST (Left)  Patient Location: SICU  Anesthesia Type:General  Level of Consciousness: sedated, unresponsive and Patient remains intubated per anesthesia plan  Airway and Oxygen Therapy: Patient remains intubated per anesthesia plan and Patient placed on Ventilator (see vital sign flow sheet for setting)  Post-op Pain: none  Post-op Assessment: Post-op Vital signs reviewed and Patient's Cardiovascular Status Stable  Post-op Vital Signs: stable  Complications: No apparent anesthesia complications

## 2013-04-08 NOTE — Anesthesia Preprocedure Evaluation (Signed)
Anesthesia Evaluation  Patient identified by MRN, date of birth, ID band Patient awake    Reviewed: Allergy & Precautions, H&P , NPO status   Airway Mallampati: II      Dental  (+) Edentulous Upper and Edentulous Lower   Pulmonary  breath sounds clear to auscultation        Cardiovascular Rhythm:Regular Rate:Normal     Neuro/Psych    GI/Hepatic   Endo/Other    Renal/GU      Musculoskeletal   Abdominal   Peds  Hematology   Anesthesia Other Findings   Reproductive/Obstetrics                           Anesthesia Physical Anesthesia Plan  ASA: III  Anesthesia Plan: General   Post-op Pain Management:    Induction: Intravenous  Airway Management Planned: Oral ETT  Additional Equipment: Arterial line, PA Cath and CVP  Intra-op Plan:   Post-operative Plan: Post-operative intubation/ventilation  Informed Consent: I have reviewed the patients History and Physical, chart, labs and discussed the procedure including the risks, benefits and alternatives for the proposed anesthesia with the patient or authorized representative who has indicated his/her understanding and acceptance.     Plan Discussed with: CRNA and Anesthesiologist  Anesthesia Plan Comments: (H/O unstable angina with severe LM and proximal RCA stenosis normal LV systolic function Htn H/O non-hodgkins lymohoma treated 25 years ago H/O TIA )        Anesthesia Quick Evaluation

## 2013-04-08 NOTE — OR Nursing (Signed)
Dr Maren Beach scrubbed in at 1000

## 2013-04-08 NOTE — Transfer of Care (Signed)
Immediate Anesthesia Transfer of Care Note  Patient: Ryan [name]  Procedure(s) Performed: Procedure(s) with comments: CORONARY ARTERY BYPASS GRAFTING (CABG) ([name]) - Times 3 using left internal mammary artery; endoscopically harvested right saphenous vein and left radial artery INTRAOPERATIVE TRANSESOPHAGEAL ECHOCARDIOGRAM ([name]) RADIAL ARTERY HARVEST (Left)  Patient Location: SICU  Anesthesia Type:General  Level of Consciousness: sedated and unresponsive  Airway & Oxygen Therapy: Patient remains intubated per anesthesia plan and Patient placed on Ventilator (see vital sign flow sheet for setting)  Post-op Assessment: Report given to PACU RN and Post -op Vital signs reviewed and stable  Post vital signs: Reviewed and stable  Complications: No apparent anesthesia complications

## 2013-04-08 NOTE — Preoperative (Signed)
Beta Blockers   Reason not to administer Beta Blockers:Not Applicable 

## 2013-04-08 NOTE — Progress Notes (Signed)
  Echocardiogram Echocardiogram Transesophageal has been performed.  Ryan [name] 04/08/2013, 9:52 AM

## 2013-04-08 NOTE — Brief Op Note (Signed)
04/07/2013 - 04/08/2013  12:35 PM  PATIENT:  Ryan [name]  62 y.o. male  PRE-OPERATIVE DIAGNOSIS:  cad  POST-OPERATIVE DIAGNOSIS:  cad  PROCEDURE:  Procedure(s): CORONARY ARTERY BYPASS GRAFTING (CABG)X3 LIMA-LAD; SVG-RCA; L RADIAL-RAMUS  INTRAOPERATIVE TRANSESOPHAGEAL ECHOCARDIOGRAM RADIAL ARTERY [name] LEFT ARM EVH RIGHT THIGH  SURGEON:  Surgeon(s): Kerin Perna, MD  PHYSICIAN ASSISTANT: Anamaria Dusenbury PA-C; Denny Peon BARRETT PA-C  ANESTHESIA:   general  PATIENT CONDITION:  ICU - intubated and hemodynamically stable.  PRE-OPERATIVE WEIGHT: 113kg  COMPLICATIONS: NO KNOWN

## 2013-04-09 ENCOUNTER — Inpatient Hospital Stay (HOSPITAL_COMMUNITY): Payer: BC Managed Care – PPO

## 2013-04-09 ENCOUNTER — Encounter (HOSPITAL_COMMUNITY): Payer: Self-pay | Admitting: Cardiology

## 2013-04-09 DIAGNOSIS — Z951 Presence of aortocoronary bypass graft: Secondary | ICD-10-CM

## 2013-04-09 DIAGNOSIS — I517 Cardiomegaly: Secondary | ICD-10-CM

## 2013-04-09 DIAGNOSIS — I2510 Atherosclerotic heart disease of native coronary artery without angina pectoris: Secondary | ICD-10-CM

## 2013-04-09 HISTORY — DX: Atherosclerotic heart disease of native coronary artery without angina pectoris: I25.10

## 2013-04-09 HISTORY — DX: Presence of aortocoronary bypass graft: Z95.1

## 2013-04-09 LAB — GLUCOSE, CAPILLARY
Glucose-Capillary: 103 mg/dL — ABNORMAL HIGH (ref 70–99)
Glucose-Capillary: 123 mg/dL — ABNORMAL HIGH (ref 70–99)
Glucose-Capillary: 125 mg/dL — ABNORMAL HIGH (ref 70–99)
Glucose-Capillary: 125 mg/dL — ABNORMAL HIGH (ref 70–99)
Glucose-Capillary: 133 mg/dL — ABNORMAL HIGH (ref 70–99)
Glucose-Capillary: 135 mg/dL — ABNORMAL HIGH (ref 70–99)
Glucose-Capillary: 138 mg/dL — ABNORMAL HIGH (ref 70–99)
Glucose-Capillary: 140 mg/dL — ABNORMAL HIGH (ref 70–99)
Glucose-Capillary: 143 mg/dL — ABNORMAL HIGH (ref 70–99)
Glucose-Capillary: 147 mg/dL — ABNORMAL HIGH (ref 70–99)
Glucose-Capillary: 168 mg/dL — ABNORMAL HIGH (ref 70–99)

## 2013-04-09 LAB — POCT I-STAT, CHEM 8
BUN: 18 mg/dL (ref 6–23)
Creatinine, Ser: 1.5 mg/dL — ABNORMAL HIGH (ref 0.50–1.35)
Potassium: 4.4 mEq/L (ref 3.5–5.1)
Sodium: 141 mEq/L (ref 135–145)

## 2013-04-09 LAB — CREATININE, SERUM
Creatinine, Ser: 1.36 mg/dL — ABNORMAL HIGH (ref 0.50–1.35)
GFR calc Af Amer: 62 mL/min — ABNORMAL LOW (ref >90)
GFR calc non Af Amer: 54 mL/min — ABNORMAL LOW (ref >90)

## 2013-04-09 LAB — POCT I-STAT 3, ART BLOOD GAS (G3+)
Acid-base deficit: 2 mmol/L (ref 0.0–2.0)
Acid-base deficit: 2 mmol/L (ref 0.0–2.0)
O2 Saturation: 99 %
O2 Saturation: 97 %
Patient temperature: 38
Patient temperature: 37.5
Patient temperature: 37.1
pCO2 arterial: 47.5 mmHg — ABNORMAL HIGH (ref 35.0–45.0)

## 2013-04-09 LAB — MAGNESIUM: Magnesium: 2.4 mg/dL (ref 1.5–2.5)

## 2013-04-09 LAB — COMPREHENSIVE METABOLIC PANEL
ALT: 14 U/L (ref 0–53)
AST: 38 U/L — ABNORMAL HIGH (ref 0–37)
Albumin: 3.3 g/dL — ABNORMAL LOW (ref 3.5–5.2)
Alkaline Phosphatase: 19 U/L — ABNORMAL LOW (ref 39–117)
BUN: 15 mg/dL (ref 6–23)
CO2: 26 mEq/L (ref 19–32)
Calcium: 7.5 mg/dL — ABNORMAL LOW (ref 8.4–10.5)
Chloride: 110 mEq/L (ref 96–112)
Creatinine, Ser: 1.26 mg/dL (ref 0.50–1.35)
GFR calc Af Amer: 68 mL/min — ABNORMAL LOW (ref >90)
GFR calc non Af Amer: 59 mL/min — ABNORMAL LOW (ref >90)
Glucose, Bld: 134 mg/dL — ABNORMAL HIGH (ref 70–99)
Potassium: 4.5 mEq/L (ref 3.5–5.1)
Sodium: 143 mEq/L (ref 135–145)
Total Bilirubin: 0.9 mg/dL (ref 0.3–1.2)
Total Protein: 4.6 g/dL — ABNORMAL LOW (ref 6.0–8.3)

## 2013-04-09 LAB — CBC
HCT: 26.1 % — ABNORMAL LOW (ref 39.0–52.0)
HCT: 27.8 % — ABNORMAL LOW (ref 39.0–52.0)
Hemoglobin: 8.9 g/dL — ABNORMAL LOW (ref 13.0–17.0)
Hemoglobin: 9.2 g/dL — ABNORMAL LOW (ref 13.0–17.0)
MCHC: 33.1 g/dL (ref 30.0–36.0)
RBC: 2.92 MIL/uL — ABNORMAL LOW (ref 4.22–5.81)
RBC: 3.07 MIL/uL — ABNORMAL LOW (ref 4.22–5.81)
RDW: 13.7 % (ref 11.5–15.5)
WBC: 8.6 10*3/uL (ref 4.0–10.5)
WBC: 15.3 10*3/uL — ABNORMAL HIGH (ref 4.0–10.5)

## 2013-04-09 LAB — AMYLASE: Amylase: 28 U/L (ref 0–105)

## 2013-04-09 MED ORDER — INSULIN ASPART 100 UNIT/ML ~~LOC~~ SOLN
0.0000 [IU] | SUBCUTANEOUS | Status: DC
  Administered 2013-04-09 – 2013-04-10 (×5): 2 [IU] via SUBCUTANEOUS

## 2013-04-09 MED ORDER — DOPAMINE-DEXTROSE 3.2-5 MG/ML-% IV SOLN: 2.5000 ug/kg/min | INTRAVENOUS | Status: DC

## 2013-04-09 MED ORDER — TRAMADOL HCL 50 MG PO TABS: 50.0000 mg | ORAL_TABLET | Freq: Four times a day (QID) | ORAL | Status: DC | PRN

## 2013-04-09 MED ORDER — DOPAMINE-DEXTROSE 3.2-5 MG/ML-% IV SOLN
2.5000 ug/kg/min | INTRAVENOUS | Status: DC
  Administered 2013-04-09: 2.5 ug/kg/min via INTRAVENOUS

## 2013-04-09 MED ORDER — FUROSEMIDE 10 MG/ML IJ SOLN
INTRAMUSCULAR | Status: AC
  Administered 2013-04-09: 20 mg via INTRAVENOUS
  Filled 2013-04-09: qty 4

## 2013-04-09 MED ORDER — INSULIN ASPART 100 UNIT/ML ~~LOC~~ SOLN
4.0000 [IU] | Freq: Three times a day (TID) | SUBCUTANEOUS | Status: DC
  Administered 2013-04-10: 4 [IU] via SUBCUTANEOUS

## 2013-04-09 MED ORDER — ISOSORBIDE MONONITRATE 15 MG HALF TABLET
15.0000 mg | ORAL_TABLET | Freq: Every day | ORAL | Status: DC
  Administered 2013-04-09 – 2013-04-16 (×8): 15 mg via ORAL
  Filled 2013-04-09 (×8): qty 1

## 2013-04-09 MED ORDER — FUROSEMIDE 10 MG/ML IJ SOLN
20.0000 mg | Freq: Two times a day (BID) | INTRAMUSCULAR | Status: DC
  Filled 2013-04-09: qty 2

## 2013-04-09 MED ORDER — VANCOMYCIN HCL IN DEXTROSE 1-5 GM/200ML-% IV SOLN
1000.0000 mg | Freq: Two times a day (BID) | INTRAVENOUS | Status: AC
  Administered 2013-04-09 (×2): 1000 mg via INTRAVENOUS
  Filled 2013-04-09 (×2): qty 200

## 2013-04-09 MED ORDER — NITROGLYCERIN IN D5W 200-5 MCG/ML-% IV SOLN: 0.0000 ug/min | INTRAVENOUS | Status: DC

## 2013-04-09 MED ORDER — INSULIN DETEMIR 100 UNIT/ML ~~LOC~~ SOLN
15.0000 [IU] | Freq: Two times a day (BID) | SUBCUTANEOUS | Status: DC
Start: 2013-04-09 — End: 2013-04-11
  Administered 2013-04-09 – 2013-04-10 (×4): 15 [IU] via SUBCUTANEOUS
  Filled 2013-04-09 (×6): qty 0.15

## 2013-04-09 MED ORDER — FUROSEMIDE 10 MG/ML IJ SOLN
40.0000 mg | Freq: Two times a day (BID) | INTRAMUSCULAR | Status: DC
  Administered 2013-04-09 – 2013-04-10 (×2): 40 mg via INTRAVENOUS
  Filled 2013-04-09 (×3): qty 4

## 2013-04-09 MED FILL — Potassium Chloride Inj 2 mEq/ML: INTRAVENOUS | Qty: 40 | Status: AC

## 2013-04-09 MED FILL — Magnesium Sulfate Inj 50%: INTRAMUSCULAR | Qty: 10 | Status: AC

## 2013-04-09 MED FILL — Albumin, Human Inj 5%: INTRAVENOUS | Qty: 250 | Status: AC

## 2013-04-09 MED FILL — Sodium Chloride IV Soln 0.9%: INTRAVENOUS | Qty: 1000 | Status: AC

## 2013-04-09 MED FILL — Sodium Chloride Irrigation Soln 0.9%: Qty: 3000 | Status: AC

## 2013-04-09 MED FILL — Heparin Sodium (Porcine) Inj 1000 Unit/ML: INTRAMUSCULAR | Qty: 30 | Status: AC

## 2013-04-09 MED FILL — Heparin Sodium (Porcine) Inj 1000 Unit/ML: INTRAMUSCULAR | Qty: 10 | Status: AC

## 2013-04-09 MED FILL — Sodium Bicarbonate IV Soln 8.4%: INTRAVENOUS | Qty: 50 | Status: AC

## 2013-04-09 MED FILL — Lidocaine HCl IV Inj 20 MG/ML: INTRAVENOUS | Qty: 5 | Status: AC

## 2013-04-09 MED FILL — Electrolyte-R (PH 7.4) Solution: INTRAVENOUS | Qty: 4000 | Status: AC

## 2013-04-09 MED FILL — Mannitol IV Soln 20%: INTRAVENOUS | Qty: 500 | Status: AC

## 2013-04-09 NOTE — Progress Notes (Addendum)
Dr. Donata Clay made aware of ABG results. Orders received to give 2 amps sodium bicarb and recheck ABG in one hour. Per Dr. Donata Clay, once pt's pressures stabilize and acidosis resolves, may begin to wean. Will continue to monitor closely. Kerin Ransom, RN

## 2013-04-09 NOTE — Progress Notes (Signed)
Subjective: Resting.   Objective: Vital signs in last 24 hours: Temp:  [97.3 F (36.3 C)-100.6 F (38.1 C)] 98.8 F (37.1 C) (07/02 0800) Pulse Rate:  [77-95] 90 (07/02 0800) Resp:  [10-23] 14 (07/02 0800) BP: (75-130)/(50-77) 122/62 mmHg (07/02 0800) SpO2:  [96 %-100 %] 99 % (07/02 0800) FiO2 (%):  [40 %-50.7 %] 40.4 % (07/02 0630) Weight change:    Intake/Output from previous day: +1654  Wt 250 up from 245 preop 07/01 0701 - 07/02 0700 In: 8131.9 [I.V.:4342.7; Blood:929.2; NG/GT:210; IV Piggyback:2650] Out: 6550 [Urine:3870; Blood:1650; Chest Tube:1030] Intake/Output this shift: Total I/O In: 57.2 [I.V.:57.2] Out: 60 [Urine:30; Chest Tube:30]  PE:  MD to see.   Hemodynamic parameters for last 24 hours:  PAP: (14-45)/(2-32) 37/23 mmHg  CVP: [10 mmHg-18 mmHg] 16 mmHg  CO: [4.4 L/min-7.1 L/min] 5.3 L/min  CI: [1.8 L/min/m2-3 L/min/m2] 2.2 L/min/m2      Lab Results:  Recent Labs  04/08/13 1936 04/08/13 2102 04/09/13 0315  WBC 17.6*  --  8.6  HGB 9.3* 8.8* 8.9*  HCT 27.6* 26.0* 26.1*  PLT 188  --  122*   BMET  Recent Labs  04/08/13 0354  04/08/13 2102 04/09/13 0315  NA 138  < > 141 143  K 4.1  < > 5.1 4.5  CL 103  --  108 110  CO2 24  --   --  26  GLUCOSE 101*  < > 144* 134*  BUN 16  --  16 15  CREATININE 1.18  < > 1.40* 1.26  CALCIUM 8.3*  --   --  7.5*  < > = values in this interval not displayed. No results found for this basename: TROPONINI, CK, MB,  in the last 72 hours  Lab Results  Component Value Date   CHOL 189 03/22/2011   HDL 38* 03/22/2011   LDLCALC 112* 03/22/2011   TRIG 194* 03/22/2011   CHOLHDL 5.0 03/22/2011   Lab Results  Component Value Date   HGBA1C 5.5 04/08/2013     Lab Results  Component Value Date   TSH 3.706 04/02/2013    Hepatic Function Panel  Recent Labs  04/09/13 0315  PROT 4.6*  ALBUMIN 3.3*  AST 38*  ALT 14  ALKPHOS 19*  BILITOT 0.9   No results found for this basename: CHOL,  in the last 72  hours No results found for this basename: PROTIME,  in the last 72 hours    EKG: Orders placed during the hospital encounter of 04/07/13  . EKG 12-LEAD  . EKG 12-LEAD  . EKG 12-LEAD  . EKG 12-LEAD  . EKG 12-LEAD  . EKG 12-LEAD    Studies/Results: Dg Chest 2 View  04/08/2013   *RADIOLOGY REPORT*  Clinical Data: CABG.  First case.  CHEST - 2 VIEW  Comparison: CT 01/28/2012.  Findings: Right axillary dissection clips are present. Cardiopericardial silhouette within normal limits. Mediastinal contours normal. Trachea midline.  No airspace disease or effusion. Aortic arch atherosclerosis. Monitoring leads are projected over the chest.  Suboptimal lateral view because the arms are not raised over head. Suboptimal inspiration.  IMPRESSION: No active cardiopulmonary disease.   Original Report Authenticated By: Andreas Newport, M.D.   Dg Chest Portable 1 View In Am  04/09/2013   *RADIOLOGY REPORT*  Clinical Data: Postop CABG, follow-up  PORTABLE CHEST - 1 VIEW  Comparison: Portable chest x-ray of 04/08/2013  Findings: The endotracheal tube has been removed.  Aeration of the lungs has improved somewhat.  Cardiomegaly is stable.  Left chest tube remains and no pneumothorax is seen.  Swan-Ganz catheter tip is in the right main pulmonary artery.  IMPRESSION: Endotracheal tube removed.  Some improvement in aeration.   Original Report Authenticated By: Dwyane Dee, M.D.   Dg Chest Port 1 View  04/08/2013   *RADIOLOGY REPORT*  Clinical Data: Status post CABG.  Difficulty breathing.  PORTABLE CHEST - 1 VIEW  Comparison: Plain film chest 04/08/2013 at 1524 hours.  Findings: Endotracheal tube is in place the tip just above the clavicular heads and could be advanced approximately 2 cm.  Theone Murdoch catheter tip is in the distal right main pulmonary artery.  NG tube courses into the stomach and below the inferior margin of the film.  Left chest tube is noted.  There is no pneumothorax.  Mild left basilar atelectasis is  again seen.  Right lung is clear.  No edema.  Lung volumes are low.  IMPRESSION: ET tube is just above the clavicular heads and could be advanced approximately 2 cm.  Mild left basilar atelectasis.  No acute finding.   Original Report Authenticated By: Holley Dexter, M.D.   Dg Chest Portable 1 View  04/08/2013   *RADIOLOGY REPORT*  Clinical Data: Postop CABG  PORTABLE CHEST - 1 VIEW  Comparison: 04/08/2013 at 0523 hours  Findings: Low lung volumes with vascular crowding.   Lower lobe opacity, likely atelectasis.  Possible small left pleural effusion. No pneumothorax.  Endotracheal tube terminates at the thoracic inlet.  Right IJ venous catheter terminates in the right main pulmonary artery. Right subclavian venous catheter terminates at the cavoatrial junction.  Enteric tube terminates in the proximal stomach.  Left chest tube and mediastinal drain.  The heart is normal in size. Postsurgical changes related to prior CABG.  IMPRESSION: Low lung volumes with suspected left lower lobe atelectasis. Possible small left pleural effusion.  Left chest tube and mediastinal drain.  No pneumothorax is seen.  Endotracheal tube terminates at the thoracic inlet.  Additional support apparatus as above.   Original Report Authenticated By: Charline Bills, M.D.   Dg Abd Portable 1v  04/09/2013   *RADIOLOGY REPORT*  Clinical Data: Post CABG  PORTABLE ABDOMEN - 1 VIEW  Comparison: Chest x-ray of 04/08/2013  Findings: A portable supine film of the abdomen shows both large and small bowel gas to be present most consistent with minimal ileus.  No definite obstruction is seen.  Multiple tubes and leads overlie the abdomen.  IMPRESSION: Perhaps minimal ileus.  No obstruction.   Original Report Authenticated By: Dwyane Dee, M.D.    Medications: I have reviewed the patient's current medications. Scheduled Meds: . acetaminophen  1,000 mg Oral Q6H   Or  . acetaminophen (TYLENOL) oral liquid 160 mg/5 mL  975 mg Per Tube Q6H  .  aspirin EC  325 mg Oral Daily   Or  . aspirin  324 mg Per Tube Daily  . atorvastatin  80 mg Oral q1800  . bisacodyl  10 mg Oral Daily   Or  . bisacodyl  10 mg Rectal Daily  . citalopram  40 mg Oral Daily  . docusate sodium  200 mg Oral Daily  . insulin regular  0-10 Units Intravenous TID WC  . levothyroxine  50 mcg Oral QAC breakfast  . metoCLOPramide (REGLAN) injection  10 mg Intravenous Q6H  . metoprolol tartrate  12.5 mg Oral BID   Or  . metoprolol tartrate  12.5 mg Per Tube BID  . [START  ON 04/10/2013] pantoprazole  40 mg Oral Daily  . sodium chloride  3 mL Intravenous Q12H  . tamoxifen  10 mg Oral BID   Continuous Infusions: . sodium chloride 20 mL/hr at 04/08/13 1509  . sodium chloride 20 mL/hr at 04/08/13 2000  . sodium chloride    . dexmedetomidine Stopped (04/09/13 0445)  . DOPamine 5 mcg/kg/min (04/09/13 0800)  . insulin (NOVOLIN-R) infusion 3.5 Units/hr (04/09/13 0800)  . lactated ringers 40 mL/hr at 04/09/13 0400  . nitroGLYCERIN 5 mcg/min (04/09/13 0800)  . norepinephrine (LEVOPHED) Adult infusion 1 mcg/min (04/09/13 0818)  . phenylephrine (NEO-SYNEPHRINE) Adult infusion Stopped (04/08/13 2115)   PRN Meds:.metoprolol, midazolam, morphine injection, ondansetron (ZOFRAN) IV, oxyCODONE, sodium chloride, sodium chloride  Assessment/Plan: Principal Problem:   S/P CABG x 3  left internal mammary artery to left anterior descending, left radial artery free graft to ramus intermediate, saphenous vein graft to right coronary artery).  Active Problems:   Hodgkin's disease   Right carotid artery occlusion   Angina, class III - progressively worsening exertional chest pressure & dyspnea, now with minimal exertion.   Hypercholesterolemia   CAD (coronary artery disease), native coronary artery   Plan:  POD #1 - CABG for LM & prox RCA disease OPERATION:  1. Coronary artery bypass grafting x3 (LIMA-LAD,LRAD-RI, SVG-RCA); 2. Endoscopic harvest of right leg greater saphenous  vein. 3. Harvest of left radial artery free graft.   Post op anemia, Extubated, Dopamine at 10, Insulin drip, NTG drip. Renal Fxn stable. A pacing, V sensing, ? PAF during the night  approx 6 sec.    LOS: 2 days   Time spent with pt. :15 minutes. [facility] R  Nurse Practitioner Certified Pager [phone] 04/09/2013, 8:58 AM  I have seen and evaluated the patient this AM along with Nada Boozer, NP. I agree with her findings, examination as well as impression recommendations. IntraOp TEE - EF ~55-60%, no significant MR/AS or AI.  Difficult operative case with post-radiation scarring.  Required significant volume load to maintain BP.   Would expect a slow recovery.  Would not be surprised if he has Post-op Afib.  Required volume intra-op -- will now need diuresis. H&H stable  MD Time with pt: 10 min  Tippi Mccrae W, M.D., M.S. [facility] [address] [place], [place]  [address]  [phone] Pager # [phone] 04/09/2013 9:38 AM

## 2013-04-09 NOTE — Procedures (Signed)
Extubation Procedure Note  Patient Details:   Name: Ryan [name] DOB: [birth year] MRN: [id number]   Airway Documentation:  Airway 8 mm (Active)  Secured at (cm) 22 cm 04/09/2013  4:17 AM  Measured From Lips 04/09/2013  4:17 AM  Secured Location Right 04/09/2013  4:17 AM  Secured By Caron Presume Tape 04/09/2013  4:17 AM  Cuff Pressure (cm H2O) 22 cm H2O 04/08/2013  2:50 PM  Site Condition Cool;Dry 04/09/2013  4:17 AM    Evaluation  O2 sats: stable throughout Complications: No apparent complications Patient did tolerate procedure well. Bilateral Breath Sounds: Clear;Diminished Suctioning: Airway Yes PT was extubated to 4L [place]. He was able to speak and had a strong cough. His parameters were VC(7.5L) and his NIF was (-40)   Sonji Starkes, Duane Lope 04/09/2013, 6:39 AM

## 2013-04-09 NOTE — Progress Notes (Addendum)
TCTS DAILY ICU PROGRESS NOTE                   [name]            [place] [address]          [phone]   1 Day Post-Op Procedure(s) (LRB): CORONARY ARTERY BYPASS GRAFTING (CABG) ([name]) INTRAOPERATIVE TRANSESOPHAGEAL ECHOCARDIOGRAM ([name]) RADIAL ARTERY HARVEST (Left)  Total Length of Stay:  LOS: 2 days   Subjective: Feels sleepy, no pain  Objective: Vital signs in last 24 hours: Temp:  [97.3 F (36.3 C)-100.6 F (38.1 C)] 99.1 F (37.3 C) (07/02 0700) Pulse Rate:  [77-95] 95 (07/02 0700) Cardiac Rhythm:  [-] Atrial paced (07/02 0700) Resp:  [10-23] 16 (07/02 0700) BP: (75-130)/(50-77) 130/66 mmHg (07/02 0700) SpO2:  [96 %-100 %] 96 % (07/02 0700) FiO2 (%):  [40 %-50.7 %] 40.4 % (07/02 0630)  Filed Weights   04/07/13 0730 04/08/13 0355  Weight: 245 lb (111.131 kg) 250 lb 7.1 oz (113.6 kg)    Weight change:    Hemodynamic parameters for last 24 hours: PAP: (14-45)/(2-32) 37/23 mmHg CVP:  [10 mmHg-18 mmHg] 16 mmHg CO:  [4.4 L/min-7.1 L/min] 5.3 L/min CI:  [1.8 L/min/m2-3 L/min/m2] 2.2 L/min/m2  Intake/Output from previous day: 07/01 0701 - 07/02 0700 In: 8131.9 [I.V.:4342.7; Blood:929.2; NG/GT:210; IV Piggyback:2650] Out: 6550 [Urine:3870; Blood:1650; Chest Tube:1030]  Intake/Output this shift:    Current Meds: Scheduled Meds: . acetaminophen  1,000 mg Oral Q6H   Or  . acetaminophen (TYLENOL) oral liquid 160 mg/5 mL  975 mg Per Tube Q6H  . aspirin EC  325 mg Oral Daily   Or  . aspirin  324 mg Per Tube Daily  . atorvastatin  80 mg Oral q1800  . bisacodyl  10 mg Oral Daily   Or  . bisacodyl  10 mg Rectal Daily  . citalopram  40 mg Oral Daily  . docusate sodium  200 mg Oral Daily  . insulin regular  0-10 Units Intravenous TID WC  . levothyroxine  50 mcg Oral QAC breakfast  . metoCLOPramide (REGLAN) injection  10 mg Intravenous Q6H  . metoprolol tartrate  12.5 mg Oral BID   Or  . metoprolol tartrate  12.5 mg Per Tube BID  . [START  ON 04/10/2013] pantoprazole  40 mg Oral Daily  . sodium chloride  3 mL Intravenous Q12H  . tamoxifen  10 mg Oral BID   Continuous Infusions: . sodium chloride 20 mL/hr at 04/08/13 1509  . sodium chloride 20 mL/hr at 04/08/13 2000  . sodium chloride    . dexmedetomidine Stopped (04/09/13 0445)  . DOPamine 5 mcg/kg/min (04/09/13 0700)  . insulin (NOVOLIN-R) infusion 5 Units/hr (04/09/13 0700)  . lactated ringers 40 mL/hr at 04/09/13 0400  . nitroGLYCERIN 5 mcg/min (04/09/13 0700)  . norepinephrine (LEVOPHED) Adult infusion Stopped (04/09/13 0230)  . phenylephrine (NEO-SYNEPHRINE) Adult infusion Stopped (04/08/13 2115)   PRN Meds:.metoprolol, midazolam, morphine injection, ondansetron (ZOFRAN) IV, oxyCODONE, sodium chloride, sodium chloride  General appearance: alert, cooperative and no distress Neurologic: intact Heart: regular rate and rhythm, S1, S2 normal and minor rub with CT's intact Lungs: fairly clear anteriorly Abdomen: mod dist, nontender Extremities: no LE edema Wound: incis.- dressings CDI  Lab Results: CBC: Recent Labs  04/08/13 1936 04/08/13 2102 04/09/13 0315  WBC 17.6*  --  8.6  HGB 9.3* 8.8* 8.9*  HCT 27.6* 26.0* 26.1*  PLT 188  --  122*   BMET:  Recent Labs  04/08/13 0354  04/08/13 2102 04/09/13 0315  NA 138  < > 141 143  K 4.1  < > 5.1 4.5  CL 103  --  108 110  CO2 24  --   --  26  GLUCOSE 101*  < > 144* 134*  BUN 16  --  16 15  CREATININE 1.18  < > 1.40* 1.26  CALCIUM 8.3*  --   --  7.5*  < > = values in this interval not displayed.  PT/INR:  Recent Labs  04/08/13 1455  LABPROT 20.4*  INR 1.80*   ABG    Component Value Date/Time   PHART 7.304* 04/09/2013 0629   PCO2ART 49.3* 04/09/2013 0629   PO2ART 88.0 04/09/2013 0629   HCO3 24.3* 04/09/2013 0629   TCO2 26 04/09/2013 0629   ACIDBASEDEF 2.0 04/09/2013 0629   O2SAT 95.0 04/09/2013 0629      Radiology: Dg Chest 2 View  04/08/2013   *RADIOLOGY REPORT*  Clinical Data: CABG.  First case.  CHEST -  2 VIEW  Comparison: CT 01/28/2012.  Findings: Right axillary dissection clips are present. Cardiopericardial silhouette within normal limits. Mediastinal contours normal. Trachea midline.  No airspace disease or effusion. Aortic arch atherosclerosis. Monitoring leads are projected over the chest.  Suboptimal lateral view because the arms are not raised over head. Suboptimal inspiration.  IMPRESSION: No active cardiopulmonary disease.   Original Report Authenticated By: Andreas Newport, M.D.   Dg Chest Port 1 View  04/08/2013   *RADIOLOGY REPORT*  Clinical Data: Status post CABG.  Difficulty breathing.  PORTABLE CHEST - 1 VIEW  Comparison: Plain film chest 04/08/2013 at 1524 hours.  Findings: Endotracheal tube is in place the tip just above the clavicular heads and could be advanced approximately 2 cm.  Theone Murdoch catheter tip is in the distal right main pulmonary artery.  NG tube courses into the stomach and below the inferior margin of the film.  Left chest tube is noted.  There is no pneumothorax.  Mild left basilar atelectasis is again seen.  Right lung is clear.  No edema.  Lung volumes are low.  IMPRESSION: ET tube is just above the clavicular heads and could be advanced approximately 2 cm.  Mild left basilar atelectasis.  No acute finding.   Original Report Authenticated By: Holley Dexter, M.D.   Dg Chest Portable 1 View  04/08/2013   *RADIOLOGY REPORT*  Clinical Data: Postop CABG  PORTABLE CHEST - 1 VIEW  Comparison: 04/08/2013 at 0523 hours  Findings: Low lung volumes with vascular crowding.   Lower lobe opacity, likely atelectasis.  Possible small left pleural effusion. No pneumothorax.  Endotracheal tube terminates at the thoracic inlet.  Right IJ venous catheter terminates in the right main pulmonary artery. Right subclavian venous catheter terminates at the cavoatrial junction.  Enteric tube terminates in the proximal stomach.  Left chest tube and mediastinal drain.  The heart is normal in size.  Postsurgical changes related to prior CABG.  IMPRESSION: Low lung volumes with suspected left lower lobe atelectasis. Possible small left pleural effusion.  Left chest tube and mediastinal drain.  No pneumothorax is seen.  Endotracheal tube terminates at the thoracic inlet.  Additional support apparatus as above.   Original Report Authenticated By: Charline Bills, M.D.     Assessment/Plan: S/P Procedure(s) (LRB): CORONARY ARTERY BYPASS GRAFTING (CABG) ([name]) INTRAOPERATIVE TRANSESOPHAGEAL ECHOCARDIOGRAM ([name]) RADIAL ARTERY HARVEST (Left)  1 stable hemodynamics on low dose levo/dopamine, should be able to wean fairly  quickly. Sinus rhythm , may be able to d/c a pacing soon. RBBB is not new 2 will require diuresis for volume overload 3 transfused overnight, H/H currently ok, leave CT's for now 4 other labs stable, glucose well controlled   Ryan [name],Ryan [name] 04/09/2013 7:58 AM patient examined and medical record reviewed,agree with above note.start BID Levimir VAN TRIGT III,Octave Montrose 04/09/2013

## 2013-04-09 NOTE — CV Procedure (Signed)
Anesthesiology Follow-up:  Alert, neuro intact, hemodynamically stable on dopamine 2.5 mcg./kg./min. Pt had labile BP last night requiring volume and neosynephrine, extubated this morning at 06:40.  Some incisional pain with coughing.  VS: T-37.1 BP 126/60 HR 85 (SR) RR 14 O2 Sat 99% on 4L PA 36/19 CO/CI 5.4/2.2   K-4.3, BUN/Cr. 15/1.26, H/H 8.9/26, Plts 122,000  Patient now stable post-extubation, one day s/p CABG.  Kipp Brood, MD

## 2013-04-09 NOTE — Progress Notes (Signed)
Dr. Donata Clay made aware pt's pressures extremely labile. When pt awakens pressures decreased to 50-60s systolic. Pt slowly recovers with increase in neo gtt. Orders received to give 4th albumin with calcium, increase dopamine gtt to 5 mcg/kg/min, and send stat CBC. Will continue to monitor closely. Kerin Ransom, RN

## 2013-04-09 NOTE — Progress Notes (Signed)
  Echocardiogram 2D Echocardiogram has been performed.  Cathie Beams 04/09/2013, 11:49 AM

## 2013-04-09 NOTE — Progress Notes (Signed)
Dr. Donata Clay called to check on pt. Made aware acidosis has corrected and pressures have stabilized (currently off levophed gtt). Will start weaning precedex gtt and attempt rapid wean per protocol. Will continue to monitor closely. Kerin Ransom, RN

## 2013-04-09 NOTE — Progress Notes (Signed)
Patient ID: Ryan [name], male   DOB: Sep 24, 1951, 62 y.o.   MRN: [id number] EVENING ROUNDS NOTE :     [address]       [place] [address]             [phone]                 1 Day Post-Op Procedure(s) (LRB): CORONARY ARTERY BYPASS GRAFTING (CABG) ([name]) INTRAOPERATIVE TRANSESOPHAGEAL ECHOCARDIOGRAM ([name]) RADIAL ARTERY HARVEST (Left)  Total Length of Stay:  LOS: 2 days  BP 125/66  Pulse 84  Temp(Src) 98.4 F (36.9 C) (Oral)  Resp 19  Ht 6\' 3"  (1.905 m)  Wt 250 lb 7.1 oz (113.6 kg)  BMI 31.3 kg/m2  SpO2 98%  .Intake/Output     07/01 0701 - 07/02 0700 07/02 0701 - 07/03 0700   P.O.  1000   I.V. (mL/kg) 4382.7 (38.6) 396.4 (3.5)   Blood 929.2    NG/GT 210    IV Piggyback 2650 200   Total Intake(mL/kg) 8171.9 (71.9) 1596.4 (14.1)   Urine (mL/kg/hr) 3870 (1.4) 1175 (1)   Blood 1650 (0.6)    Chest Tube 1030 (0.4) 210 (0.2)   Total Output 6550 1385   Net +1621.9 +211.4          . sodium chloride 20 mL/hr at 04/08/13 1509  . sodium chloride    . DOPamine    . insulin (NOVOLIN-R) infusion 3.1 Units/hr (04/09/13 1100)  . nitroGLYCERIN    . norepinephrine (LEVOPHED) Adult infusion 1 mcg/min (04/09/13 0818)  . phenylephrine (NEO-SYNEPHRINE) Adult infusion Stopped (04/08/13 2115)     Lab Results  Component Value Date   WBC 15.3* 04/09/2013   HGB 9.2* 04/09/2013   HCT 27.8* 04/09/2013   PLT 149* 04/09/2013   GLUCOSE 137* 04/09/2013   CHOL 189 03/22/2011   TRIG 194* 03/22/2011   HDL 38* 03/22/2011   LDLCALC 112* 03/22/2011   ALT 14 04/09/2013   AST 38* 04/09/2013   NA 141 04/09/2013   K 4.4 04/09/2013   CL 104 04/09/2013   CREATININE 1.36* 04/09/2013   BUN 18 04/09/2013   CO2 26 04/09/2013   TSH 3.706 04/02/2013   INR 1.80* 04/08/2013   HGBA1C 5.5 04/08/2013   Just walked around the unit On dopamine stable  [name] MD  Beeper [phone] Office [phone] 04/09/2013 5:50 PM

## 2013-04-09 NOTE — Op Note (Signed)
NAMEELO, MARMOLEJOS NO.:  [id number]  MEDICAL RECORD NO.:  [id number]  LOCATION:  2312                         FACILITY:  [facility]  PHYSICIAN:  Kerin Perna, M.D.  DATE OF BIRTH:  1950/10/11  DATE OF PROCEDURE: DATE OF DISCHARGE:                              OPERATIVE REPORT   OPERATION: 1. Coronary artery bypass grafting x3 (left internal mammary artery to     left anterior descending, left radial artery free graft to ramus     intermediate, saphenous vein graft to right coronary artery). 2. Endoscopic harvest of right leg greater saphenous vein. 3. Harvest of left radial artery free graft.  SURGEON:  Kerin Perna, MD.  ASSISTANT:  Rowe Clack, PA-C.  PREOPERATIVE DIAGNOSIS:  Unstable angina with severe left main coronary artery disease.  POSTOPERATIVE DIAGNOSIS:  Unstable angina with severe left main coronary artery disease.  ANESTHESIA:  General.  INDICATIONS:  The patient is an obese 62 year old male with a history of high-dose radiation to his chest and mediastinum 25 years ago for Hodgkin disease.  He also had a right mastectomy for invasive adenocarcinoma of the breast approximately 2-3 years ago.  He presented with unstable angina and cardiac catheterization showing a significant left main stenosis.  Surgical coronary revascularization was recommended.  Prior to surgery, I examined the patient in the hospital and reviewed the results of his cardiac cath with the patient and his wife.  I discussed indications and expected benefits of multivessel bypass grafting for treatment of his severe left main CAD.  His LV function was fairly well preserved, but he did have some diastolic dysfunction.  I discussed the major aspects of surgery including the use of general anesthesia in cardiopulmonary bypass, the location of the surgical incisions, the expected postoperative hospital recovery, and the potential risks of stroke, bleeding, blood  transfusion requirement, MI, infection, and death.  After reviewing these issues, he demonstrated his understanding and agreed to proceed with surgery under what I felt was an informed consent.  OPERATIVE FINDINGS: 1. Left IMA mildly thickened from radiation, but with excellent flow. 2. Left radial artery, good conduit with good flow. 3. Small saphenous vein, but adequate.  OPERATIVE PROCEDURE:  The patient was brought to the operating room and placed supine on the operating table where general anesthesia was induced under invasive hemodynamic monitoring.  The chest, abdomen, and legs were prepped with Betadine and draped as a sterile field.  The left arm was prepped and draped as a sterile field.  First, the conduit was harvested with an endoscopic technique used to harvest the right leg greater saphenous vein from the knee to the thigh.  The left radial artery was harvested as a free graft after documenting that there was adequate perfusion in the hand after occlusion of the radial artery.  The radial artery was harvested and the left arm was closed and tucked to the side.  A sternal incision was made.  Examination of the anterior mediastinum revealed a large firm lymph node which was sent for pathology.  The sternal elevating retractor was used to expose left IMA which was harvested as a pedicle graft from its origin at the  subclavian vessels. The chest had significant scarring and was fairly stiff from his previous radiation.  The sternal retractor was placed and the sternum was retracted.  The pericardium was opened and suspended.  The heart was inspected and found to be hypertrophied and dilated.  Heparin was administered and pursestrings were placed in the ascending aorta and right atrium.  The patient was then cannulated and placed on cardiopulmonary bypass after the ACT was documented as being therapeutic.  The mammary artery and vein grafts were prepared for the  anastomosis and the distal coronary vessels were identified for grafting.  Cardioplegia cannulas were placed for both antegrade and retrograde cold blood cardioplegia.  The patient was cooled to 32 degrees.  The aortic crossclamp was applied. 1.2 L of cold blood cardioplegia was delivered in split doses between the antegrade aortic and retrograde coronary sinus catheters.  There was good cardioplegic arrest and septal temperature dropped less than 12 degrees.  Cardioplegia was delivered every 20 minutes while the crossclamp was in place.  The distal coronary anastomoses were then performed.  The first distal anastomosis was to the distal RCA.  There was a proximal 70-80% stenosis.  The reverse saphenous vein (small size) was sewn end-to-side with running 7-0 Prolene with good flow through the graft.  The second distal anastomosis was to the ramus intermediate branch of left coronary.  This was a 1.5 mm intramyocardial vessel.  Radial artery was sewn end-to-side with running 8-0 Prolene, and there was good flow through the graft.  Cardioplegia was redosed.  The third distal anastomosis was to the mid LAD.  This was a somewhat small friable vessel.  It probed a 1.5-mm probe.  There was a proximal 90% left main stenosis.  The left IMA pedicle was brought through an opening in the left lateral pericardium, was brought down onto the LAD and sewn end-to-side with running 8-0 Prolene.  There was good flow through the anastomosis after briefly releasing the pedicle bulldog on the mammary artery.  The bulldog was reapplied and the pedicle was secured to the epicardium with 6-0 Prolene.  Cardioplegia was redosed.  While the crossclamp was still in place, 2 proximal anastomoses were placed on the ascending aorta using a 4.5 mm punch and running 7-0 Prolene.  The radial artery was sewn end-to-side to the aorta followed by the saphenous vein.  Prior to releasing the crossclamp, air was vented  from the coronaries with a dose of retrograde warm blood cardioplegia and the crossclamp was then removed.  Heart resumed a spontaneous rhythm.  The grafts were de-aired and opened and each had good flow.  Hemostasis was documented in the proximal and distal sites.  The patient was rewarmed and reperfused.  Temporary pacing wires were applied.  Low-dose dopamine was started.  The lungs were expanded and the ventilator was resumed.  The patient was then weaned from cardiopulmonary bypass with stable hemodynamics.  The echo showed preserved LV function.  Protamine was administered without adverse reaction.  The cannula was removed.  The mediastinum was irrigated with warm saline.  The superior pericardial fat was closed over the aorta.  An anterior mediastinal and left pleural chest tube were placed and brought out through separate incisions.  The sternum was closed with double gauge wire.  The pectoralis fascia was closed with interrupted #1 Vicryl.  The subcutaneous and skin layers were closed with running Vicryl and sterile dressings were applied.  Total cardiopulmonary bypass time was 120 minutes.  Kerin Perna, M.D.     PV/MEDQ  D:  04/08/2013  T:  04/09/2013  Job:  [id number]

## 2013-04-10 ENCOUNTER — Inpatient Hospital Stay (HOSPITAL_COMMUNITY): Payer: BC Managed Care – PPO

## 2013-04-10 ENCOUNTER — Encounter (HOSPITAL_COMMUNITY): Payer: Self-pay | Admitting: Cardiology

## 2013-04-10 DIAGNOSIS — R002 Palpitations: Secondary | ICD-10-CM

## 2013-04-10 DIAGNOSIS — Z01810 Encounter for preprocedural cardiovascular examination: Secondary | ICD-10-CM

## 2013-04-10 DIAGNOSIS — I6529 Occlusion and stenosis of unspecified carotid artery: Secondary | ICD-10-CM

## 2013-04-10 DIAGNOSIS — Z7901 Long term (current) use of anticoagulants: Secondary | ICD-10-CM

## 2013-04-10 LAB — GLUCOSE, CAPILLARY
Glucose-Capillary: 125 mg/dL — ABNORMAL HIGH (ref 70–99)
Glucose-Capillary: 126 mg/dL — ABNORMAL HIGH (ref 70–99)
Glucose-Capillary: 130 mg/dL — ABNORMAL HIGH (ref 70–99)
Glucose-Capillary: 147 mg/dL — ABNORMAL HIGH (ref 70–99)

## 2013-04-10 LAB — BASIC METABOLIC PANEL
BUN: 18 mg/dL (ref 6–23)
CO2: 28 mEq/L (ref 19–32)
Calcium: 8 mg/dL — ABNORMAL LOW (ref 8.4–10.5)
Chloride: 104 mEq/L (ref 96–112)
Creatinine, Ser: 1.33 mg/dL (ref 0.50–1.35)
GFR calc Af Amer: 64 mL/min — ABNORMAL LOW (ref >90)
GFR calc non Af Amer: 55 mL/min — ABNORMAL LOW (ref >90)
Glucose, Bld: 120 mg/dL — ABNORMAL HIGH (ref 70–99)
Potassium: 3.9 mEq/L (ref 3.5–5.1)
Sodium: 139 mEq/L (ref 135–145)

## 2013-04-10 LAB — CBC
HCT: 26.6 % — ABNORMAL LOW (ref 39.0–52.0)
Hemoglobin: 8.7 g/dL — ABNORMAL LOW (ref 13.0–17.0)
MCH: 29.7 pg (ref 26.0–34.0)
MCHC: 32.7 g/dL (ref 30.0–36.0)
MCV: 90.8 fL (ref 78.0–100.0)
Platelets: 135 10*3/uL — ABNORMAL LOW (ref 150–400)
RBC: 2.93 MIL/uL — ABNORMAL LOW (ref 4.22–5.81)
RDW: 14.1 % (ref 11.5–15.5)
WBC: 14.6 10*3/uL — ABNORMAL HIGH (ref 4.0–10.5)

## 2013-04-10 MED ORDER — WARFARIN - PHYSICIAN DOSING INPATIENT
Freq: Every day | Status: DC
  Administered 2013-04-14 – 2013-04-15 (×2)

## 2013-04-10 MED ORDER — FUROSEMIDE 10 MG/ML IJ SOLN
40.0000 mg | Freq: Two times a day (BID) | INTRAMUSCULAR | Status: AC
  Administered 2013-04-10 – 2013-04-11 (×3): 40 mg via INTRAVENOUS
  Filled 2013-04-10 (×2): qty 4

## 2013-04-10 MED ORDER — SODIUM CHLORIDE 0.9 % IJ SOLN: 3.0000 mL | INTRAMUSCULAR | Status: DC | PRN

## 2013-04-10 MED ORDER — ONDANSETRON HCL 4 MG PO TABS: 4.0000 mg | ORAL_TABLET | Freq: Four times a day (QID) | ORAL | Status: DC | PRN

## 2013-04-10 MED ORDER — FUROSEMIDE 40 MG PO TABS
40.0000 mg | ORAL_TABLET | Freq: Every day | ORAL | Status: DC
  Filled 2013-04-10: qty 1

## 2013-04-10 MED ORDER — BISACODYL 10 MG RE SUPP
10.0000 mg | Freq: Every day | RECTAL | Status: DC | PRN
  Administered 2013-04-12: 10 mg via RECTAL
  Filled 2013-04-10: qty 1

## 2013-04-10 MED ORDER — TRAMADOL HCL 50 MG PO TABS
50.0000 mg | ORAL_TABLET | ORAL | Status: DC | PRN
  Administered 2013-04-10: 100 mg via ORAL
  Filled 2013-04-10: qty 2

## 2013-04-10 MED ORDER — OXYCODONE HCL 5 MG PO TABS
5.0000 mg | ORAL_TABLET | ORAL | Status: DC | PRN
  Administered 2013-04-11: 10 mg via ORAL
  Administered 2013-04-11: 5 mg via ORAL
  Administered 2013-04-11 (×2): 10 mg via ORAL
  Administered 2013-04-12: 5 mg via ORAL
  Administered 2013-04-12 – 2013-04-13 (×2): 10 mg via ORAL
  Administered 2013-04-13: 5 mg via ORAL
  Administered 2013-04-13: 10 mg via ORAL
  Administered 2013-04-14: 5 mg via ORAL
  Administered 2013-04-14: 10 mg via ORAL
  Filled 2013-04-10 (×3): qty 2
  Filled 2013-04-10 (×2): qty 1
  Filled 2013-04-10 (×3): qty 2
  Filled 2013-04-10 (×2): qty 1
  Filled 2013-04-10: qty 2

## 2013-04-10 MED ORDER — ONDANSETRON HCL 4 MG/2ML IJ SOLN: 4.0000 mg | Freq: Four times a day (QID) | INTRAMUSCULAR | Status: DC | PRN

## 2013-04-10 MED ORDER — POTASSIUM CHLORIDE 10 MEQ/50ML IV SOLN
INTRAVENOUS | Status: AC
  Administered 2013-04-10: 10 meq via INTRAVENOUS
  Filled 2013-04-10: qty 50

## 2013-04-10 MED ORDER — MAGNESIUM HYDROXIDE 400 MG/5ML PO SUSP: 30.0000 mL | Freq: Every day | ORAL | Status: DC | PRN

## 2013-04-10 MED ORDER — MOVING RIGHT ALONG BOOK
Freq: Once | Status: AC
  Filled 2013-04-10: qty 1

## 2013-04-10 MED ORDER — PANTOPRAZOLE SODIUM 40 MG PO TBEC
40.0000 mg | DELAYED_RELEASE_TABLET | Freq: Every day | ORAL | Status: DC
  Administered 2013-04-11 – 2013-04-16 (×6): 40 mg via ORAL
  Filled 2013-04-10 (×6): qty 1

## 2013-04-10 MED ORDER — WARFARIN SODIUM 2.5 MG PO TABS
2.5000 mg | ORAL_TABLET | Freq: Every day | ORAL | Status: AC
  Administered 2013-04-10: 2.5 mg via ORAL
  Filled 2013-04-10: qty 1

## 2013-04-10 MED ORDER — ACETAMINOPHEN 325 MG PO TABS: 650.0000 mg | ORAL_TABLET | Freq: Four times a day (QID) | ORAL | Status: DC | PRN

## 2013-04-10 MED ORDER — POTASSIUM CHLORIDE CRYS ER 20 MEQ PO TBCR
20.0000 meq | EXTENDED_RELEASE_TABLET | Freq: Every day | ORAL | Status: DC
  Administered 2013-04-10 – 2013-04-11 (×2): 20 meq via ORAL
  Filled 2013-04-10 (×4): qty 1

## 2013-04-10 MED ORDER — LISINOPRIL 5 MG PO TABS
5.0000 mg | ORAL_TABLET | Freq: Every day | ORAL | Status: DC
  Administered 2013-04-10: 5 mg via ORAL
  Filled 2013-04-10 (×2): qty 1

## 2013-04-10 MED ORDER — DOCUSATE SODIUM 100 MG PO CAPS
200.0000 mg | ORAL_CAPSULE | Freq: Every day | ORAL | Status: DC
  Administered 2013-04-10 – 2013-04-15 (×6): 200 mg via ORAL
  Filled 2013-04-10 (×7): qty 2

## 2013-04-10 MED ORDER — SODIUM CHLORIDE 0.9 % IV SOLN: 250.0000 mL | INTRAVENOUS | Status: DC | PRN

## 2013-04-10 MED ORDER — BISACODYL 5 MG PO TBEC
10.0000 mg | DELAYED_RELEASE_TABLET | Freq: Every day | ORAL | Status: DC | PRN
  Filled 2013-04-10: qty 2

## 2013-04-10 MED ORDER — WARFARIN - PHYSICIAN DOSING INPATIENT: Freq: Every day | Status: DC

## 2013-04-10 MED ORDER — SODIUM CHLORIDE 0.9 % IJ SOLN
3.0000 mL | Freq: Two times a day (BID) | INTRAMUSCULAR | Status: DC
  Administered 2013-04-12 – 2013-04-13 (×2): 3 mL via INTRAVENOUS

## 2013-04-10 MED ORDER — METOPROLOL TARTRATE 25 MG PO TABS: 25.0000 mg | ORAL_TABLET | Freq: Two times a day (BID) | ORAL | Status: DC

## 2013-04-10 MED ORDER — POTASSIUM CHLORIDE CRYS ER 20 MEQ PO TBCR
20.0000 meq | EXTENDED_RELEASE_TABLET | Freq: Every day | ORAL | Status: DC
  Administered 2013-04-10 – 2013-04-14 (×5): 20 meq via ORAL
  Filled 2013-04-10 (×6): qty 1

## 2013-04-10 MED ORDER — MOVING RIGHT ALONG BOOK
Freq: Once | Status: AC
  Administered 2013-04-10: 10:00:00
  Filled 2013-04-10: qty 1

## 2013-04-10 MED ORDER — INSULIN ASPART 100 UNIT/ML ~~LOC~~ SOLN
0.0000 [IU] | Freq: Three times a day (TID) | SUBCUTANEOUS | Status: DC
  Administered 2013-04-10 (×2): 2 [IU] via SUBCUTANEOUS
  Administered 2013-04-11: 4 [IU] via SUBCUTANEOUS
  Administered 2013-04-11 – 2013-04-14 (×8): 2 [IU] via SUBCUTANEOUS
  Administered 2013-04-14: 4 [IU] via SUBCUTANEOUS
  Administered 2013-04-14 – 2013-04-15 (×2): 2 [IU] via SUBCUTANEOUS
  Administered 2013-04-16: 4 [IU] via SUBCUTANEOUS

## 2013-04-10 MED ORDER — ALUM & MAG HYDROXIDE-SIMETH 200-200-20 MG/5ML PO SUSP: 15.0000 mL | ORAL | Status: DC | PRN

## 2013-04-10 MED ORDER — METOPROLOL TARTRATE 25 MG PO TABS
25.0000 mg | ORAL_TABLET | Freq: Two times a day (BID) | ORAL | Status: DC
  Administered 2013-04-10 – 2013-04-13 (×8): 25 mg via ORAL
  Filled 2013-04-10 (×10): qty 1

## 2013-04-10 MED ORDER — POTASSIUM CHLORIDE 10 MEQ/50ML IV SOLN
10.0000 meq | INTRAVENOUS | Status: AC
  Administered 2013-04-10: 10 meq via INTRAVENOUS
  Filled 2013-04-10: qty 50

## 2013-04-10 NOTE — Progress Notes (Signed)
Subjective:  Up in chair, awake and alert.  Objective:  Vital Signs in the last 24 hours: Temp:  [97.8 F (36.6 C)-98.5 F (36.9 C)] 98.2 F (36.8 C) (07/03 1124) Pulse Rate:  [77-101] 80 (07/03 1200) Resp:  [0-25] 20 (07/03 1200) BP: (114-149)/(59-94) 140/65 mmHg (07/03 1200) SpO2:  [91 %-100 %] 94 % (07/03 1200) Weight:  [119.75 kg (264 lb)] 119.75 kg (264 lb) (07/03 0500)  Intake/Output from previous day:  Intake/Output Summary (Last 24 hours) at 04/10/13 1324 Last data filed at 04/10/13 1253  Gross per 24 hour  Intake 1335.47 ml  Output   2800 ml  Net -1464.53 ml    Physical Exam: General appearance: alert, cooperative and no distress Lungs: clear to auscultation bilaterally Heart: regular rate and rhythm   Rate: 80  Rhythm: normal sinus rhythm and PVCs  Lab Results:  Recent Labs  04/09/13 1655 04/10/13 0425  WBC 15.3* 14.6*  HGB 9.2* 8.7*  PLT 149* 135*    Recent Labs  04/09/13 0315 04/09/13 1652 04/09/13 1655 04/10/13 0425  NA 143 141  --  139  K 4.5 4.4  --  3.9  CL 110 104  --  104  CO2 26  --   --  28  GLUCOSE 134* 137*  --  120*  BUN 15 18  --  18  CREATININE 1.26 1.50* 1.36* 1.33   No results found for this basename: TROPONINI, CK, MB,  in the last 72 hours Hepatic Function Panel  Recent Labs  04/09/13 0315  PROT 4.6*  ALBUMIN 3.3*  AST 38*  ALT 14  ALKPHOS 19*  BILITOT 0.9   No results found for this basename: CHOL,  in the last 72 hours  Recent Labs  04/08/13 1455  INR 1.80*    Imaging: Imaging results have been reviewed  Cardiac Studies:  Assessment/Plan:   Principal Problem:   Angina, class III - cath 04/07/13- 3V CAD Active Problems:   S/P CABG x 3  LIMA-LAD, free LRA to RI, SVG-RCA  - 04/09/13   Chronic anticoagulation- Coumadin   Invasive ductal carcinoma of breast, stage 2- 2012 s/p chemo and mastectomy   Hodgkin's disease-1983   Known RICA occlusion (CVA '07) partial LICA stenosis (40-59%) Jan 2014  Hypercholesterolemia   Claudication of calf muscles    PLAN: Doing well post op, will discuss resumption of Coumadin with Dr Herbie Baltimore.  Corine Shelter PA-C Beeper [phone] 04/10/2013, 1:24 PM

## 2013-04-10 NOTE — Plan of Care (Signed)
Problem: Phase III Progression Outcomes Goal: Time patient transferred to PCTU/Telemetry POD Outcome: Completed/Met Date Met:  04/10/13 Transferred to 2017 via wheelchair after ambulating in unit x 100 ft.  Patient tolerated transfer well.  Placed on monitor on 2000.  Vitals stable. Goal: Discharge plan remains appropriate-arrangements made Outcome: Completed/Met Date Met:  04/10/13 Discharge home with family/wife.

## 2013-04-10 NOTE — Progress Notes (Signed)
Patient called out for assistance. Patient was assisted back to bed per wife. Instructed to allow staff to assist patient. Patient short of breath; O2 sat 90%. Placed on 2L; sat 97%. Appears to be in pain but denies. Patient stated that he was indeed in pain 7/10 on pain scale but was trying not to take meds because he didn't want to become addicted. Patient encourage to take pain medication in order to deep breathe and ambulate effectively. Patient agreed; ultram x 2 given. Remaining VSS. Call bell and family near. Will continue to monitor.Mamie Levers

## 2013-04-10 NOTE — Progress Notes (Signed)
[facility] DAILY ICU PROGRESS NOTE                   [address]            [place] [id number]          [phone]   2 Days Post-Op Procedure(s) (LRB): CORONARY ARTERY BYPASS GRAFTING (CABG) ([name]) INTRAOPERATIVE TRANSESOPHAGEAL ECHOCARDIOGRAM ([name]) RADIAL ARTERY HARVEST (Left)  Total Length of Stay:  LOS: 3 days   Subjective: Feels weaker than yesterday  Objective: Vital signs in last 24 hours: Temp:  [97.7 F (36.5 C)-98.8 F (37.1 C)] 97.8 F (36.6 C) (07/03 0723) Pulse Rate:  [77-101] 81 (07/03 0700) Cardiac Rhythm:  [-] Normal sinus rhythm (07/02 2300) Resp:  [0-25] 20 (07/03 0700) BP: (114-149)/(59-94) 124/59 mmHg (07/03 0700) SpO2:  [91 %-100 %] 94 % (07/03 0700) Weight:  [264 lb (119.75 kg)] 264 lb (119.75 kg) (07/03 0500)  Filed Weights   04/07/13 0730 04/08/13 0355 04/10/13 0500  Weight: 245 lb (111.131 kg) 250 lb 7.1 oz (113.6 kg) 264 lb (119.75 kg)    Weight change:    Hemodynamic parameters for last 24 hours: PAP: (34-36)/(19-21) 36/19 mmHg CVP:  [12 mmHg-16 mmHg] 16 mmHg CO:  [5.4 L/min-5.6 L/min] 5.4 L/min CI:  [2.2 L/min/m2-2.3 L/min/m2] 2.2 L/min/m2  Intake/Output from previous day: 07/02 0701 - 07/03 0700 In: 2346.4 [P.O.:1550; I.V.:396.4; IV Piggyback:400] Out: 2865 [UXLKG:4010; Chest Tube:380]  Intake/Output this shift:    Current Meds: Scheduled Meds: . acetaminophen  1,000 mg Oral Q6H   Or  . acetaminophen (TYLENOL) oral liquid 160 mg/5 mL  975 mg Per Tube Q6H  . aspirin EC  325 mg Oral Daily   Or  . aspirin  324 mg Per Tube Daily  . atorvastatin  80 mg Oral q1800  . bisacodyl  10 mg Oral Daily   Or  . bisacodyl  10 mg Rectal Daily  . citalopram  40 mg Oral Daily  . docusate sodium  200 mg Oral Daily  . furosemide  40 mg Intravenous BID  . insulin aspart  0-24 Units Subcutaneous Q4H  . insulin aspart  4 Units Subcutaneous TID WC  . insulin detemir  15 Units Subcutaneous BID  . insulin regular  0-10 Units  Intravenous TID WC  . isosorbide mononitrate  15 mg Oral Daily  . levothyroxine  50 mcg Oral QAC breakfast  . metoCLOPramide (REGLAN) injection  10 mg Intravenous Q6H  . metoprolol tartrate  12.5 mg Oral BID   Or  . metoprolol tartrate  12.5 mg Per Tube BID  . pantoprazole  40 mg Oral Daily  . sodium chloride  3 mL Intravenous Q12H  . tamoxifen  10 mg Oral BID   Continuous Infusions: . sodium chloride 20 mL/hr at 04/08/13 1509  . sodium chloride    . DOPamine    . insulin (NOVOLIN-R) infusion 3.1 Units/hr (04/09/13 1100)  . nitroGLYCERIN    . norepinephrine (LEVOPHED) Adult infusion 1 mcg/min (04/09/13 0818)  . phenylephrine (NEO-SYNEPHRINE) Adult infusion Stopped (04/08/13 2115)   PRN Meds:.metoprolol, midazolam, ondansetron (ZOFRAN) IV, oxyCODONE, sodium chloride, sodium chloride, traMADol  General appearance: alert, cooperative and no distress Neurologic: intact Heart: regular rate and rhythm and S1, S2 normal Lungs: dim in bases Abdomen: mild distension, nontender Extremities: some edema in hands Wound: incisions heling well  Lab Results: CBC: Recent Labs  04/09/13 1655 04/10/13 0425  WBC 15.3* 14.6*  HGB 9.2* 8.7*  HCT 27.8* 26.6*  PLT 149* 135*   BMET:  Recent Labs  04/09/13 0315 04/09/13 1652 04/09/13 1655 04/10/13 0425  NA 143 141  --  139  K 4.5 4.4  --  3.9  CL 110 104  --  104  CO2 26  --   --  28  GLUCOSE 134* 137*  --  120*  BUN 15 18  --  18  CREATININE 1.26 1.50* 1.36* 1.33  CALCIUM 7.5*  --   --  8.0*    PT/INR:  Recent Labs  04/08/13 1455  LABPROT 20.4*  INR 1.80*   Radiology: Dg Chest Portable 1 View In Am  04/09/2013   *RADIOLOGY REPORT*  Clinical Data: Postop CABG, follow-up  PORTABLE CHEST - 1 VIEW  Comparison: Portable chest x-ray of 04/08/2013  Findings: The endotracheal tube has been removed.  Aeration of the lungs has improved somewhat.  Cardiomegaly is stable.  Left chest tube remains and no pneumothorax is seen.  Swan-Ganz  catheter tip is in the right main pulmonary artery.  IMPRESSION: Endotracheal tube removed.  Some improvement in aeration.   Original Report Authenticated By: Dwyane Dee, M.D.   Dg Chest Port 1 View  04/08/2013   *RADIOLOGY REPORT*  Clinical Data: Status post CABG.  Difficulty breathing.  PORTABLE CHEST - 1 VIEW  Comparison: Plain film chest 04/08/2013 at 1524 hours.  Findings: Endotracheal tube is in place the tip just above the clavicular heads and could be advanced approximately 2 cm.  Theone Murdoch catheter tip is in the distal right main pulmonary artery.  NG tube courses into the stomach and below the inferior margin of the film.  Left chest tube is noted.  There is no pneumothorax.  Mild left basilar atelectasis is again seen.  Right lung is clear.  No edema.  Lung volumes are low.  IMPRESSION: ET tube is just above the clavicular heads and could be advanced approximately 2 cm.  Mild left basilar atelectasis.  No acute finding.   Original Report Authenticated By: Holley Dexter, M.D.   Dg Chest Portable 1 View  04/08/2013   *RADIOLOGY REPORT*  Clinical Data: Postop CABG  PORTABLE CHEST - 1 VIEW  Comparison: 04/08/2013 at 0523 hours  Findings: Low lung volumes with vascular crowding.   Lower lobe opacity, likely atelectasis.  Possible small left pleural effusion. No pneumothorax.  Endotracheal tube terminates at the thoracic inlet.  Right IJ venous catheter terminates in the right main pulmonary artery. Right subclavian venous catheter terminates at the cavoatrial junction.  Enteric tube terminates in the proximal stomach.  Left chest tube and mediastinal drain.  The heart is normal in size. Postsurgical changes related to prior CABG.  IMPRESSION: Low lung volumes with suspected left lower lobe atelectasis. Possible small left pleural effusion.  Left chest tube and mediastinal drain.  No pneumothorax is seen.  Endotracheal tube terminates at the thoracic inlet.  Additional support apparatus as above.    Original Report Authenticated By: Charline Bills, M.D.   Dg Abd Portable 1v  04/09/2013   *RADIOLOGY REPORT*  Clinical Data: Post CABG  PORTABLE ABDOMEN - 1 VIEW  Comparison: Chest x-ray of 04/08/2013  Findings: A portable supine film of the abdomen shows both large and small bowel gas to be present most consistent with minimal ileus.  No definite obstruction is seen.  Multiple tubes and leads overlie the abdomen.  IMPRESSION: Perhaps minimal ileus.  No obstruction.   Original Report Authenticated By: Dwyane Dee, M.D.     Assessment/Plan:  S/P Procedure(s) (LRB): CORONARY ARTERY BYPASS GRAFTING (CABG) ([name]) INTRAOPERATIVE TRANSESOPHAGEAL ECHOCARDIOGRAM ([name]) RADIAL ARTERY HARVEST (Left) Mobilize Diuresis Diabetes control d/c tubes/lines Plan for transfer to step-down: see transfer orders Labs stable    GOLD,WAYNE E 04/10/2013 7:47 AM

## 2013-04-10 NOTE — Progress Notes (Signed)
Patient received from 2300. Assessed per flow sheet with no changes noted. Midline and left radial dressing are dry/intact. Patient states soreness but refuses medication for pain. Out of bed to bedside chair. VSS. Call bell near. Will continue to monitor.Ryan [name]

## 2013-04-10 NOTE — Progress Notes (Addendum)
Pt. Seen and examined. Agree with the NP/PA-C note as written. Making good post-op progress. Blood pressure is somewhat elevated. Getting metoprolol instead of carvedilol at home. Renal function improved, ?room for ACE-I or ARB. Would not recommend restarting warfarin due to difficulty of procedure and post-operative bleeding risk - apparently warfarin indication is for history of stroke and carotid artery disease.  Chrystie Nose, MD, [facility] Attending Cardiologist [facility]

## 2013-04-10 NOTE — Progress Notes (Signed)
2 Days Post-Op Procedure(s) (LRB): CORONARY ARTERY BYPASS GRAFTING (CABG) ([name]) INTRAOPERATIVE TRANSESOPHAGEAL ECHOCARDIOGRAM ([name]) RADIAL ARTERY HARVEST (Left) Subjective: Doing well post CABG with radail art nsr ambulating  Objective: Vital signs in last 24 hours: Temp:  [97.7 F (36.5 C)-98.8 F (37.1 C)] 97.8 F (36.6 C) (07/03 0723) Pulse Rate:  [77-101] 81 (07/03 0800) Cardiac Rhythm:  [-] Normal sinus rhythm (07/02 2300) Resp:  [0-25] 19 (07/03 0800) BP: (114-149)/(59-94) 130/72 mmHg (07/03 0800) SpO2:  [91 %-100 %] 95 % (07/03 0800) Weight:  [264 lb (119.75 kg)] 264 lb (119.75 kg) (07/03 0500)  Hemodynamic parameters for last 24 hours: PAP: (34-36)/(19-21) 36/19 mmHg CVP:  [14 mmHg-16 mmHg] 16 mmHg CO:  [5.4 L/min] 5.4 L/min CI:  [2.2 L/min/m2] 2.2 L/min/m2  Intake/Output from previous day: 07/02 0701 - 07/03 0700 In: 2346.4 [P.O.:1550; I.V.:396.4; IV Piggyback:400] Out: 2865 [WUJWJ:1914; Chest Tube:380] Intake/Output this shift:    Lungs clear Mild edema  Lab Results:  Recent Labs  04/09/13 1655 04/10/13 0425  WBC 15.3* 14.6*  HGB 9.2* 8.7*  HCT 27.8* 26.6*  PLT 149* 135*   BMET:  Recent Labs  04/09/13 0315 04/09/13 1652 04/09/13 1655 04/10/13 0425  NA 143 141  --  139  K 4.5 4.4  --  3.9  CL 110 104  --  104  CO2 26  --   --  28  GLUCOSE 134* 137*  --  120*  BUN 15 18  --  18  CREATININE 1.26 1.50* 1.36* 1.33  CALCIUM 7.5*  --   --  8.0*    PT/INR:  Recent Labs  04/08/13 1455  LABPROT 20.4*  INR 1.80*   ABG    Component Value Date/Time   PHART 7.323* 04/09/2013 0748   HCO3 24.1* 04/09/2013 0748   TCO2 26 04/09/2013 1652   ACIDBASEDEF 2.0 04/09/2013 0748   O2SAT 97.0 04/09/2013 0748   CBG (last 3)   Recent Labs  04/10/13 0007 04/10/13 0412 04/10/13 0721  GLUCAP 147* 121* 126*    Assessment/Plan: S/P Procedure(s) (LRB): CORONARY ARTERY BYPASS GRAFTING (CABG) ([name]) INTRAOPERATIVE TRANSESOPHAGEAL ECHOCARDIOGRAM ([name]) RADIAL  ARTERY HARVEST (Left) Plan for transfer to step-down: see transfer orders   LOS: 3 days    VAN TRIGT III,PETER 04/10/2013

## 2013-04-11 ENCOUNTER — Inpatient Hospital Stay (HOSPITAL_COMMUNITY): Payer: BC Managed Care – PPO

## 2013-04-11 DIAGNOSIS — I739 Peripheral vascular disease, unspecified: Secondary | ICD-10-CM

## 2013-04-11 DIAGNOSIS — Z7901 Long term (current) use of anticoagulants: Secondary | ICD-10-CM

## 2013-04-11 LAB — GLUCOSE, CAPILLARY
Glucose-Capillary: 141 mg/dL — ABNORMAL HIGH (ref 70–99)
Glucose-Capillary: 172 mg/dL — ABNORMAL HIGH (ref 70–99)

## 2013-04-11 MED ORDER — LEVALBUTEROL HCL 0.63 MG/3ML IN NEBU
0.6300 mg | INHALATION_SOLUTION | Freq: Four times a day (QID) | RESPIRATORY_TRACT | Status: DC
  Administered 2013-04-11 – 2013-04-12 (×4): 0.63 mg via RESPIRATORY_TRACT
  Filled 2013-04-11 (×8): qty 3

## 2013-04-11 MED ORDER — LEVALBUTEROL HCL 0.63 MG/3ML IN NEBU: 0.6300 mg | INHALATION_SOLUTION | Freq: Three times a day (TID) | RESPIRATORY_TRACT | Status: DC | PRN

## 2013-04-11 MED ORDER — LISINOPRIL 10 MG PO TABS
10.0000 mg | ORAL_TABLET | Freq: Every day | ORAL | Status: DC
  Administered 2013-04-11: 10 mg via ORAL
  Filled 2013-04-11 (×2): qty 1

## 2013-04-11 MED ORDER — WARFARIN SODIUM 2.5 MG PO TABS
2.5000 mg | ORAL_TABLET | Freq: Once | ORAL | Status: AC
  Administered 2013-04-11: 2.5 mg via ORAL
  Filled 2013-04-11: qty 1

## 2013-04-11 NOTE — Progress Notes (Addendum)
Subjective:  POD # 3 CABG X 3. No CP/SOB  Objective:  Temp:  [97.9 F (36.6 C)-99.2 F (37.3 C)] 98.2 F (36.8 C) (07/04 0540) Pulse Rate:  [80-89] 84 (07/04 0540) Resp:  [18-20] 20 (07/04 0540) BP: (130-149)/(55-73) 149/68 mmHg (07/04 0540) SpO2:  [91 %-99 %] 94 % (07/04 0540) Weight:  [260 lb 12.9 oz (118.3 kg)] 260 lb 12.9 oz (118.3 kg) (07/04 0540) Weight change: -3 lb 3.1 oz (-1.45 kg)  Intake/Output from previous day: 07/03 0701 - 07/04 0700 In: 20 [I.V.:20] Out: 1500 [Urine:1500]  Intake/Output from this shift:    Physical Exam: General appearance: alert and no distress Neck: no adenopathy, no carotid bruit, no JVD, supple, symmetrical, trachea midline and thyroid not enlarged, symmetric, no tenderness/mass/nodules Lungs: clear to auscultation bilaterally Heart: regular rate and rhythm, S1, S2 normal, no murmur, click, rub or gallop Extremities: extremities normal, atraumatic, no cyanosis or edema  Lab Results: Results for orders placed during the hospital encounter of 04/07/13 (from the past 48 hour(s))  GLUCOSE, CAPILLARY     Status: Abnormal   Collection Time    04/09/13  9:06 AM      Result Value Range   Glucose-Capillary 119 (*) 70 - 99 mg/dL  GLUCOSE, CAPILLARY     Status: Abnormal   Collection Time    04/09/13 10:10 AM      Result Value Range   Glucose-Capillary 111 (*) 70 - 99 mg/dL  GLUCOSE, CAPILLARY     Status: Abnormal   Collection Time    04/09/13 11:16 AM      Result Value Range   Glucose-Capillary 112 (*) 70 - 99 mg/dL  GLUCOSE, CAPILLARY     Status: Abnormal   Collection Time    04/09/13 11:56 AM      Result Value Range   Glucose-Capillary 108 (*) 70 - 99 mg/dL  GLUCOSE, CAPILLARY     Status: Abnormal   Collection Time    04/09/13  1:40 PM      Result Value Range   Glucose-Capillary 132 (*) 70 - 99 mg/dL  GLUCOSE, CAPILLARY     Status: Abnormal   Collection Time    04/09/13  3:59 PM      Result Value Range   Glucose-Capillary  143 (*) 70 - 99 mg/dL   Comment 1 Notify RN    POCT I-STAT, CHEM 8     Status: Abnormal   Collection Time    04/09/13  4:52 PM      Result Value Range   Sodium 141  135 - 145 mEq/L   Potassium 4.4  3.5 - 5.1 mEq/L   Chloride 104  96 - 112 mEq/L   BUN 18  6 - 23 mg/dL   Creatinine, Ser 6.21 (*) 0.50 - 1.35 mg/dL   Glucose, Bld 308 (*) 70 - 99 mg/dL   Calcium, Ion 6.57  8.46 - 1.30 mmol/L   TCO2 26  0 - 100 mmol/L   Hemoglobin 8.8 (*) 13.0 - 17.0 g/dL   HCT 96.2 (*) 95.2 - 84.1 %  MAGNESIUM     Status: None   Collection Time    04/09/13  4:55 PM      Result Value Range   Magnesium 2.4  1.5 - 2.5 mg/dL  CBC     Status: Abnormal   Collection Time    04/09/13  4:55 PM      Result Value Range   WBC 15.3 (*) 4.0 - 10.5 K/uL  RBC 3.07 (*) 4.22 - 5.81 MIL/uL   Hemoglobin 9.2 (*) 13.0 - 17.0 g/dL   HCT 40.9 (*) 81.1 - 91.4 %   MCV 90.6  78.0 - 100.0 fL   MCH 30.0  26.0 - 34.0 pg   MCHC 33.1  30.0 - 36.0 g/dL   RDW 78.2  95.6 - 21.3 %   Platelets 149 (*) 150 - 400 K/uL  CREATININE, SERUM     Status: Abnormal   Collection Time    04/09/13  4:55 PM      Result Value Range   Creatinine, Ser 1.36 (*) 0.50 - 1.35 mg/dL   GFR calc non Af Amer 54 (*) >90 mL/min   GFR calc Af Amer 62 (*) >90 mL/min   Comment:            The eGFR has been calculated     using the CKD EPI equation.     This calculation has not been     validated in all clinical     situations.     eGFR's persistently     <90 mL/min signify     possible Chronic Kidney Disease.  GLUCOSE, CAPILLARY     Status: Abnormal   Collection Time    04/09/13  8:27 PM      Result Value Range   Glucose-Capillary 142 (*) 70 - 99 mg/dL  GLUCOSE, CAPILLARY     Status: Abnormal   Collection Time    04/10/13 12:07 AM      Result Value Range   Glucose-Capillary 147 (*) 70 - 99 mg/dL  GLUCOSE, CAPILLARY     Status: Abnormal   Collection Time    04/10/13  4:12 AM      Result Value Range   Glucose-Capillary 121 (*) 70 - 99 mg/dL    BASIC METABOLIC PANEL     Status: Abnormal   Collection Time    04/10/13  4:25 AM      Result Value Range   Sodium 139  135 - 145 mEq/L   Potassium 3.9  3.5 - 5.1 mEq/L   Chloride 104  96 - 112 mEq/L   CO2 28  19 - 32 mEq/L   Glucose, Bld 120 (*) 70 - 99 mg/dL   BUN 18  6 - 23 mg/dL   Creatinine, Ser 0.86  0.50 - 1.35 mg/dL   Calcium 8.0 (*) 8.4 - 10.5 mg/dL   GFR calc non Af Amer 55 (*) >90 mL/min   GFR calc Af Amer 64 (*) >90 mL/min   Comment:            The eGFR has been calculated     using the CKD EPI equation.     This calculation has not been     validated in all clinical     situations.     eGFR's persistently     <90 mL/min signify     possible Chronic Kidney Disease.  CBC     Status: Abnormal   Collection Time    04/10/13  4:25 AM      Result Value Range   WBC 14.6 (*) 4.0 - 10.5 K/uL   RBC 2.93 (*) 4.22 - 5.81 MIL/uL   Hemoglobin 8.7 (*) 13.0 - 17.0 g/dL   HCT 57.8 (*) 46.9 - 62.9 %   MCV 90.8  78.0 - 100.0 fL   MCH 29.7  26.0 - 34.0 pg   MCHC 32.7  30.0 - 36.0 g/dL   RDW  14.1  11.5 - 15.5 %   Platelets 135 (*) 150 - 400 K/uL  GLUCOSE, CAPILLARY     Status: Abnormal   Collection Time    04/10/13  7:21 AM      Result Value Range   Glucose-Capillary 126 (*) 70 - 99 mg/dL   Comment 1 Notify RN    GLUCOSE, CAPILLARY     Status: Abnormal   Collection Time    04/10/13 11:22 AM      Result Value Range   Glucose-Capillary 139 (*) 70 - 99 mg/dL   Comment 1 Notify RN    GLUCOSE, CAPILLARY     Status: Abnormal   Collection Time    04/10/13  4:24 PM      Result Value Range   Glucose-Capillary 125 (*) 70 - 99 mg/dL   Comment 1 Documented in Chart     Comment 2 Notify RN    GLUCOSE, CAPILLARY     Status: Abnormal   Collection Time    04/10/13  9:40 PM      Result Value Range   Glucose-Capillary 130 (*) 70 - 99 mg/dL   Comment 1 Documented in Chart     Comment 2 Notify RN    GLUCOSE, CAPILLARY     Status: Abnormal   Collection Time    04/11/13  6:23 AM       Result Value Range   Glucose-Capillary 117 (*) 70 - 99 mg/dL   Comment 1 Documented in Chart     Comment 2 Notify RN      Imaging: Imaging results have been reviewed  Assessment/Plan:   1. Principal Problem: 2.   Angina, class III - cath 04/07/13- 3V CAD 3. Active Problems: 4.   Invasive ductal carcinoma of breast, stage 2- 2012 s/p chemo and mastectomy 5.   Hodgkin's disease-1983 6.   Known RICA occlusion (CVA '07) partial LICA stenosis (40-59%) Jan 2014 7.   Hypercholesterolemia 8.   Claudication of calf muscles 9.   S/P CABG x 3  LIMA-LAD, free LRA to RI, SVG-RCA  - 04/09/13 10.   Chronic anticoagulation- Coumadin 11.   Time Spent Directly with Patient:  20 minutes  Length of Stay:  LOS: 4 days   POD # 3 CABG X 3 in setting of ACS with LM disease. VSS. Labs OK. Exam benign. NSR. Nl progression. [facility]. Prob home early nest week  Per TCTS. Prob will need w/u of claudication in our office as OP after D/C.   Ryan [name] 04/11/2013, 8:41 AM

## 2013-04-11 NOTE — Progress Notes (Addendum)
[address]       [place] [address]             [phone]      3 Days Post-Op  Procedure(s) (LRB): CORONARY ARTERY BYPASS GRAFTING (CABG) ([name]) INTRAOPERATIVE TRANSESOPHAGEAL ECHOCARDIOGRAM ([name]) RADIAL ARTERY HARVEST (Left) Subjective: Feeling stronger each day  Objective  Telemetry sinus rhythm  Temp:  [97.9 F (36.6 C)-99.2 F (37.3 C)] 98.2 F (36.8 C) (07/04 0540) Pulse Rate:  [80-89] 84 (07/04 0540) Resp:  [18-20] 20 (07/04 0540) BP: (130-149)/(55-73) 149/68 mmHg (07/04 0540) SpO2:  [91 %-99 %] 94 % (07/04 0540) Weight:  [260 lb 12.9 oz (118.3 kg)] 260 lb 12.9 oz (118.3 kg) (07/04 0540)   Intake/Output Summary (Last 24 hours) at 04/11/13 0804 Last data filed at 04/11/13 0025  Gross per 24 hour  Intake     20 ml  Output   1450 ml  Net  -1430 ml       General appearance: alert, cooperative and no distress Heart: regular rate and rhythm Lungs: slightly coarse BS in upper airways, dim in left base Abdomen: soft, nontender Extremities: mild edema of extrems. Wound: incis healing well, some serosang drainage l radial incision site  Lab Results:  Recent Labs  04/09/13 0315 04/09/13 1652 04/09/13 1655 04/10/13 0425  NA 143 141  --  139  K 4.5 4.4  --  3.9  CL 110 104  --  104  CO2 26  --   --  28  GLUCOSE 134* 137*  --  120*  BUN 15 18  --  18  CREATININE 1.26 1.50* 1.36* 1.33  CALCIUM 7.5*  --   --  8.0*  MG 2.6*  --  2.4  --     Recent Labs  04/09/13 0315  AST 38*  ALT 14  ALKPHOS 19*  BILITOT 0.9  PROT 4.6*  ALBUMIN 3.3*    Recent Labs  04/09/13 0315  AMYLASE 28    Recent Labs  04/09/13 1655 04/10/13 0425  WBC 15.3* 14.6*  HGB 9.2* 8.7*  HCT 27.8* 26.6*  MCV 90.6 90.8  PLT 149* 135*   No results found for this basename: CKTOTAL, CKMB, TROPONINI,  in the last 72 hours No components found with this basename: POCBNP,  No results found for this basename: DDIMER,  in the last 72 hours No results  found for this basename: HGBA1C,  in the last 72 hours No results found for this basename: CHOL, HDL, LDLCALC, TRIG, CHOLHDL,  in the last 72 hours No results found for this basename: TSH, T4TOTAL, FREET3, T3FREE, THYROIDAB,  in the last 72 hours No results found for this basename: VITAMINB12, FOLATE, FERRITIN, TIBC, IRON, RETICCTPCT,  in the last 72 hours  Medications: Scheduled . aspirin EC  325 mg Oral Daily  . atorvastatin  80 mg Oral q1800  . citalopram  40 mg Oral Daily  . docusate sodium  200 mg Oral Daily  . furosemide  40 mg Intravenous BID  . insulin aspart  0-24 Units Subcutaneous TID AC & HS  . insulin aspart  4 Units Subcutaneous TID WC  . insulin detemir  15 Units Subcutaneous BID  . isosorbide mononitrate  15 mg Oral Daily  . levothyroxine  50 mcg Oral QAC breakfast  . lisinopril  5 mg Oral Daily  . metoprolol tartrate  25 mg Oral BID  . pantoprazole  40 mg Oral QAC breakfast  . potassium chloride  20 mEq Oral Daily  . potassium chloride  20 mEq Oral Daily  . sodium chloride  3 mL Intravenous Q12H  . sodium chloride  3 mL Intravenous Q12H  . tamoxifen  10 mg Oral BID  . Warfarin - Physician Dosing Inpatient   Does not apply q1800     Radiology/Studies:  Dg Chest 2 View  04/11/2013   *RADIOLOGY REPORT*  Clinical Data: Postoperative radiographs following CABG.  CHEST - 2 VIEW  Comparison: 04/10/2013.  Findings: Expected appearance following CABG.  Interval removal of the right IJ vascular sheath.  The right subclavian central line remains present with the tip in the mid SVC.  There is a subpulmonic right pleural effusion with right basilar atelectasis. Cardiopericardial silhouette mildly enlarged.  Epicardial pacing leads noted.  No pneumothorax. Monitoring leads are projected over the chest.  Small left pleural effusion is also present.  IMPRESSION:  1. Removal of support apparatus except for the right subclavian central line. 2.  Right greater than left pleural effusions  with basilar atelectasis. 3.   CABG.   Original Report Authenticated By: Andreas Newport, M.D.   Dg Chest Port 1 View  04/10/2013   *RADIOLOGY REPORT*  Clinical Data: Status post cardiac surgery.  PORTABLE CHEST - 1 VIEW  Comparison: Chest x-ray 04/09/2013.  Findings: There is a right-sided internal jugular central venous catheter with tip terminating in the proximal superior vena cava. There is a right-sided subclavian central venous catheter with tip terminating in the distal superior vena cava. Left-sided chest tube in position projecting over the lower left hemithorax.  Previously noted mediastinal drains and Swan-Ganz catheter have been removed. Lung volumes are low with extensive opacities at the left base, favored to reflect resolving postoperative atelectasis and small left pleural effusion.  No evidence of pulmonary edema.  Heart size is borderline enlarged.  Mediastinal contours are distorted by patient positioning.  Status post median sternotomy for CABG. Right axillary surgical clips suggesting prior nodal dissection.  IMPRESSION: 1.  Support apparatus, as above. 2.  Otherwise, allowing for slight differences in patient positioning, the appearance of the thorax is essentially unchanged, as above.   Original Report Authenticated By: Trudie Reed, M.D.    INR: Will add last result for INR, ABG once components are confirmed Will add last 4 CBG results once components are confirmed  Assessment/Plan: S/P Procedure(s) (LRB): CORONARY ARTERY BYPASS GRAFTING (CABG) ([name]) INTRAOPERATIVE TRANSESOPHAGEAL ECHOCARDIOGRAM ([name]) RADIAL ARTERY HARVEST (Left)  1 steady progress 2 push pulm toilet/rehab as able 3 cont diuresis for volume overload and pleural effus(small)- currently BID IV 4 sugars controlled- d/c levmir, preop hgba1c 5.5 5 BP will to increase in ace inhib, recheck creat in am     LOS: 4 days    GOLD,WAYNE E 7/4/20148:04 AM   Chart reviewed, patient examined, agree with  above. His main complaint is wheezing and he is wheezing now. He says he had this when he came to the hospital. This may be due to volume excess. He is diuresing. Will add bronchodilator.

## 2013-04-12 DIAGNOSIS — I209 Angina pectoris, unspecified: Secondary | ICD-10-CM

## 2013-04-12 LAB — GLUCOSE, CAPILLARY: Glucose-Capillary: 112 mg/dL — ABNORMAL HIGH (ref 70–99)

## 2013-04-12 LAB — TYPE AND SCREEN
Antibody Screen: NEGATIVE
Unit division: 0
Unit division: 0

## 2013-04-12 LAB — PROTIME-INR: Prothrombin Time: 15.8 seconds — ABNORMAL HIGH (ref 11.6–15.2)

## 2013-04-12 LAB — BASIC METABOLIC PANEL
Calcium: 8.1 mg/dL — ABNORMAL LOW (ref 8.4–10.5)
Chloride: 103 mEq/L (ref 96–112)
Creatinine, Ser: 1.87 mg/dL — ABNORMAL HIGH (ref 0.50–1.35)
GFR calc Af Amer: 42 mL/min — ABNORMAL LOW (ref >90)
GFR calc non Af Amer: 37 mL/min — ABNORMAL LOW (ref >90)

## 2013-04-12 LAB — CBC
HCT: 25.7 % — ABNORMAL LOW (ref 39.0–52.0)
Hemoglobin: 8.4 g/dL — ABNORMAL LOW (ref 13.0–17.0)
MCHC: 32.7 g/dL (ref 30.0–36.0)
MCV: 93.1 fL (ref 78.0–100.0)
RDW: 14.9 % (ref 11.5–15.5)
WBC: 13.6 10*3/uL — ABNORMAL HIGH (ref 4.0–10.5)

## 2013-04-12 MED ORDER — SODIUM CHLORIDE 0.9 % IJ SOLN
10.0000 mL | INTRAMUSCULAR | Status: DC | PRN
  Administered 2013-04-15 (×2): 20 mL
  Administered 2013-04-16: 10 mL

## 2013-04-12 MED ORDER — AMLODIPINE BESYLATE 5 MG PO TABS
5.0000 mg | ORAL_TABLET | Freq: Every day | ORAL | Status: DC
  Administered 2013-04-12: 5 mg via ORAL
  Filled 2013-04-12 (×3): qty 1

## 2013-04-12 MED ORDER — WARFARIN SODIUM 5 MG PO TABS
5.0000 mg | ORAL_TABLET | Freq: Once | ORAL | Status: AC
  Administered 2013-04-12: 5 mg via ORAL
  Filled 2013-04-12: qty 1

## 2013-04-12 MED ORDER — FUROSEMIDE 40 MG PO TABS
40.0000 mg | ORAL_TABLET | Freq: Every day | ORAL | Status: DC
  Administered 2013-04-12 – 2013-04-14 (×3): 40 mg via ORAL
  Filled 2013-04-12 (×4): qty 1

## 2013-04-12 MED ORDER — LEVALBUTEROL HCL 0.63 MG/3ML IN NEBU
0.6300 mg | INHALATION_SOLUTION | Freq: Three times a day (TID) | RESPIRATORY_TRACT | Status: DC
  Administered 2013-04-12 – 2013-04-16 (×11): 0.63 mg via RESPIRATORY_TRACT
  Filled 2013-04-12 (×23): qty 3

## 2013-04-12 MED ORDER — LEVALBUTEROL HCL 0.63 MG/3ML IN NEBU
0.6300 mg | INHALATION_SOLUTION | RESPIRATORY_TRACT | Status: DC | PRN
  Filled 2013-04-12: qty 3

## 2013-04-12 NOTE — Progress Notes (Addendum)
[address]       [place] [address]             [phone]      4 Days Post-Op  Procedure(s) (LRB): CORONARY ARTERY BYPASS GRAFTING (CABG) ([name]) INTRAOPERATIVE TRANSESOPHAGEAL ECHOCARDIOGRAM ([name]) RADIAL ARTERY HARVEST (Left) Subjective: Feels better, still on O2 with intermittent wheezing  Objective  Telemetry sinus rhythm  Temp:  [98.2 F (36.8 C)-99.5 F (37.5 C)] 98.2 F (36.8 C) (07/05 0535) Pulse Rate:  [79-97] 79 (07/05 0535) Resp:  [17-19] 19 (07/05 0535) BP: (105-164)/(55-82) 105/75 mmHg (07/05 0535) SpO2:  [95 %] 95 % (07/05 0535) Weight:  [257 lb 6.4 oz (116.756 kg)] 257 lb 6.4 oz (116.756 kg) (07/05 0535)   Intake/Output Summary (Last 24 hours) at 04/12/13 0755 Last data filed at 04/12/13 0543  Gross per 24 hour  Intake    220 ml  Output   1000 ml  Net   -780 ml       General appearance: alert, cooperative and no distress Heart: regular rate and rhythm Lungs: + exp wheexing Abdomen: soft, nontender Extremities: + LE edema- improved Wound: incis healing well  Lab Results:  Recent Labs  04/09/13 1655 04/10/13 0425 04/12/13 0437  NA  --  139 140  K  --  3.9 4.0  CL  --  104 103  CO2  --  28 29  GLUCOSE  --  120* 123*  BUN  --  18 41*  CREATININE 1.36* 1.33 1.87*  CALCIUM  --  8.0* 8.1*  MG 2.4  --   --    No results found for this basename: AST, ALT, ALKPHOS, BILITOT, PROT, ALBUMIN,  in the last 72 hours No results found for this basename: LIPASE, AMYLASE,  in the last 72 hours  Recent Labs  04/10/13 0425 04/12/13 0437  WBC 14.6* 13.6*  HGB 8.7* 8.4*  HCT 26.6* 25.7*  MCV 90.8 93.1  PLT 135* 217   No results found for this basename: CKTOTAL, CKMB, TROPONINI,  in the last 72 hours No components found with this basename: POCBNP,  No results found for this basename: DDIMER,  in the last 72 hours No results found for this basename: HGBA1C,  in the last 72 hours No results found for this basename: CHOL,  HDL, LDLCALC, TRIG, CHOLHDL,  in the last 72 hours No results found for this basename: TSH, T4TOTAL, FREET3, T3FREE, THYROIDAB,  in the last 72 hours No results found for this basename: VITAMINB12, FOLATE, FERRITIN, TIBC, IRON, RETICCTPCT,  in the last 72 hours  Medications: Scheduled . aspirin EC  325 mg Oral Daily  . atorvastatin  80 mg Oral q1800  . citalopram  40 mg Oral Daily  . docusate sodium  200 mg Oral Daily  . furosemide  40 mg Intravenous BID  . insulin aspart  0-24 Units Subcutaneous TID AC & HS  . isosorbide mononitrate  15 mg Oral Daily  . levalbuterol  0.63 mg Nebulization Q6H  . levothyroxine  50 mcg Oral QAC breakfast  . lisinopril  10 mg Oral Daily  . metoprolol tartrate  25 mg Oral BID  . pantoprazole  40 mg Oral QAC breakfast  . potassium chloride  20 mEq Oral Daily  . potassium chloride  20 mEq Oral Daily  . sodium chloride  3 mL Intravenous Q12H  . sodium chloride  3 mL Intravenous Q12H  . tamoxifen  10 mg Oral BID  . Warfarin -  Physician Dosing Inpatient   Does not apply q1800     Radiology/Studies:  Dg Chest 2 View  04/11/2013   *RADIOLOGY REPORT*  Clinical Data: Postoperative radiographs following CABG.  CHEST - 2 VIEW  Comparison: 04/10/2013.  Findings: Expected appearance following CABG.  Interval removal of the right IJ vascular sheath.  The right subclavian central line remains present with the tip in the mid SVC.  There is a subpulmonic right pleural effusion with right basilar atelectasis. Cardiopericardial silhouette mildly enlarged.  Epicardial pacing leads noted.  No pneumothorax. Monitoring leads are projected over the chest.  Small left pleural effusion is also present.  IMPRESSION:  1. Removal of support apparatus except for the right subclavian central line. 2.  Right greater than left pleural effusions with basilar atelectasis. 3.   CABG.   Original Report Authenticated By: Andreas Newport, M.D.    INR:1.29 Will add last result for INR, ABG once  components are confirmed Will add last 4 CBG results once components are confirmed  Assessment/Plan: S/P Procedure(s) (LRB): CORONARY ARTERY BYPASS GRAFTING (CABG) ([name]) INTRAOPERATIVE TRANSESOPHAGEAL ECHOCARDIOGRAM ([name]) RADIAL ARTERY HARVEST (Left)  1 steady slow progress 2 cont xopenex for wheeze, cont pulm toilet/rehab 3 will cont lasix po for now, small effusions on CXR- follow, may need to repeat CXR prior to D/C 4 creat has jumped, will d/c ace inh for now and add norvasc for HTN 5 cont coumadin for carotid dz ( on preop), H/H stable    LOS: 5 days    [name],[name] 7/5/20147:55 AM   Chart reviewed, patient examined, agree with above. He sounds better today. Weight is still 12 lbs over preop. Will recheck creat in am and if not rising he may need more diuresis.

## 2013-04-12 NOTE — Progress Notes (Addendum)
Subjective:  No CP/SOB   Objective:  Temp:  [98.2 F (36.8 C)-99.5 F (37.5 C)] 98.2 F (36.8 C) (07/05 0535) Pulse Rate:  [79-97] 79 (07/05 0535) Resp:  [17-19] 19 (07/05 0535) BP: (105-164)/(55-82) 105/75 mmHg (07/05 0535) SpO2:  [95 %] 95 % (07/05 0535) Weight:  [257 lb 6.4 oz (116.756 kg)] 257 lb 6.4 oz (116.756 kg) (07/05 0535) Weight change: -3 lb 6.5 oz (-1.544 kg)  Intake/Output from previous day: 07/04 0701 - 07/05 0700 In: 220 [P.O.:220] Out: 1000 [Urine:1000]  Intake/Output from this shift:    Physical Exam: General appearance: alert and no distress Neck: no adenopathy, no carotid bruit, no JVD, supple, symmetrical, trachea midline and thyroid not enlarged, symmetric, no tenderness/mass/nodules Lungs: Wheezes bilaterally Heart: regular rate and rhythm, S1, S2 normal, no murmur, click, rub or gallop Extremities: trace BLE edema  Lab Results: Results for orders placed during the hospital encounter of 04/07/13 (from the past 48 hour(s))  GLUCOSE, CAPILLARY     Status: Abnormal   Collection Time    04/10/13 11:22 AM      Result Value Range   Glucose-Capillary 139 (*) 70 - 99 mg/dL   Comment 1 Notify RN    GLUCOSE, CAPILLARY     Status: Abnormal   Collection Time    04/10/13  4:24 PM      Result Value Range   Glucose-Capillary 125 (*) 70 - 99 mg/dL   Comment 1 Documented in Chart     Comment 2 Notify RN    GLUCOSE, CAPILLARY     Status: Abnormal   Collection Time    04/10/13  9:40 PM      Result Value Range   Glucose-Capillary 130 (*) 70 - 99 mg/dL   Comment 1 Documented in Chart     Comment 2 Notify RN    GLUCOSE, CAPILLARY     Status: Abnormal   Collection Time    04/11/13  6:23 AM      Result Value Range   Glucose-Capillary 117 (*) 70 - 99 mg/dL   Comment 1 Documented in Chart     Comment 2 Notify RN    GLUCOSE, CAPILLARY     Status: Abnormal   Collection Time    04/11/13 11:27 AM      Result Value Range   Glucose-Capillary 141 (*) 70 - 99  mg/dL   Comment 1 Notify RN    GLUCOSE, CAPILLARY     Status: Abnormal   Collection Time    04/11/13  4:26 PM      Result Value Range   Glucose-Capillary 172 (*) 70 - 99 mg/dL  GLUCOSE, CAPILLARY     Status: Abnormal   Collection Time    04/11/13  9:40 PM      Result Value Range   Glucose-Capillary 132 (*) 70 - 99 mg/dL  BASIC METABOLIC PANEL     Status: Abnormal   Collection Time    04/12/13  4:37 AM      Result Value Range   Sodium 140  135 - 145 mEq/L   Potassium 4.0  3.5 - 5.1 mEq/L   Chloride 103  96 - 112 mEq/L   CO2 29  19 - 32 mEq/L   Glucose, Bld 123 (*) 70 - 99 mg/dL   BUN 41 (*) 6 - 23 mg/dL   Creatinine, Ser 8.29 (*) 0.50 - 1.35 mg/dL   Calcium 8.1 (*) 8.4 - 10.5 mg/dL   GFR calc non Af Denyse Dago  37 (*) >90 mL/min   GFR calc Af Amer 42 (*) >90 mL/min   Comment:            The eGFR has been calculated     using the CKD EPI equation.     This calculation has not been     validated in all clinical     situations.     eGFR's persistently     <90 mL/min signify     possible Chronic Kidney Disease.  PROTIME-INR     Status: Abnormal   Collection Time    04/12/13  4:37 AM      Result Value Range   Prothrombin Time 15.8 (*) 11.6 - 15.2 seconds   INR 1.29  0.00 - 1.49  CBC     Status: Abnormal   Collection Time    04/12/13  4:37 AM      Result Value Range   WBC 13.6 (*) 4.0 - 10.5 K/uL   RBC 2.76 (*) 4.22 - 5.81 MIL/uL   Hemoglobin 8.4 (*) 13.0 - 17.0 g/dL   HCT 40.9 (*) 81.1 - 91.4 %   MCV 93.1  78.0 - 100.0 fL   MCH 30.4  26.0 - 34.0 pg   MCHC 32.7  30.0 - 36.0 g/dL   RDW 78.2  95.6 - 21.3 %   Platelets 217  150 - 400 K/uL  GLUCOSE, CAPILLARY     Status: Abnormal   Collection Time    04/12/13  6:06 AM      Result Value Range   Glucose-Capillary 133 (*) 70 - 99 mg/dL   Comment 1 Documented in Chart     Comment 2 Notify RN      Imaging: Imaging results have been reviewed  Assessment/Plan:   1. Principal Problem: 2.   Angina, class III - cath 04/07/13-  3V CAD 3. Active Problems: 4.   Invasive ductal carcinoma of breast, stage 2- 2012 s/p chemo and mastectomy 5.   Hodgkin's disease-1983 6.   Known RICA occlusion (CVA '07) partial LICA stenosis (40-59%) Jan 2014 7.   Hypercholesterolemia 8.   Claudication of calf muscles 9.   S/P CABG x 3  LIMA-LAD, free LRA to RI, SVG-RCA  - 04/09/13 10.   Chronic anticoagulation- Coumadin 11.   Time Spent Directly with Patient:  20  minutes  Length of Stay:  LOS: 5 days   POD # 5. CABG. Slow but steady progression. VSS. Labs OK. On coumadin AC secondary to old CVA with occluded Carotid (not convinced that he really needs this). Has wheezing on exam getting Xopenex nebs. [facility]. Prob home mid week per TCTS. Moderate sized Right pleural effusion. Continue diuretics and follow clinically and radiographically . May require thoracentesis although coumadin has been restarted. Will defer to TCTS.  Amybeth Sieg J 04/12/2013, 8:40 AM

## 2013-04-12 NOTE — Progress Notes (Signed)
Pt. Walked 150 feet using walker, and O2. He tolerated the walk well.

## 2013-04-12 NOTE — Progress Notes (Signed)
CARDIAC REHAB PHASE I   PRE:  Rate/Rhythm: 83 SR  BP:  Supine: 154/67 lt leg Sitting:   Standing:    SaO2: 95% 2L  MODE:  Ambulation: 150 ft   POST:  Rate/Rhythm: 95 SR  BP:  Supine:   Sitting: 166/55 lt leg  Standing:    SaO2: 93% 2L  0832-0910 Pt tolerated ambulation fair with assist x1 and pushing rolling walker. Gait slow, steady, c/o moderated SOB with walk. SaO2 93% on 2L O2. To chair after walk with legs elevated.  Cristy Hilts, MS, ACSM CES

## 2013-04-13 LAB — GLUCOSE, CAPILLARY
Glucose-Capillary: 126 mg/dL — ABNORMAL HIGH (ref 70–99)
Glucose-Capillary: 125 mg/dL — ABNORMAL HIGH (ref 70–99)

## 2013-04-13 LAB — BASIC METABOLIC PANEL
Calcium: 7.9 mg/dL — ABNORMAL LOW (ref 8.4–10.5)
Creatinine, Ser: 1.28 mg/dL (ref 0.50–1.35)
GFR calc non Af Amer: 58 mL/min — ABNORMAL LOW (ref >90)
Glucose, Bld: 127 mg/dL — ABNORMAL HIGH (ref 70–99)
Sodium: 139 mEq/L (ref 135–145)

## 2013-04-13 MED ORDER — WARFARIN SODIUM 5 MG PO TABS
5.0000 mg | ORAL_TABLET | Freq: Once | ORAL | Status: AC
Start: 2013-04-13 — End: 2013-04-13
  Administered 2013-04-13: 5 mg via ORAL
  Filled 2013-04-13: qty 1

## 2013-04-13 MED ORDER — FUROSEMIDE 10 MG/ML IJ SOLN
40.0000 mg | Freq: Once | INTRAMUSCULAR | Status: AC
  Administered 2013-04-13: 40 mg via INTRAVENOUS
  Filled 2013-04-13: qty 4

## 2013-04-13 MED ORDER — AMLODIPINE BESYLATE 10 MG PO TABS
10.0000 mg | ORAL_TABLET | Freq: Every day | ORAL | Status: DC
  Administered 2013-04-13 – 2013-04-16 (×4): 10 mg via ORAL
  Filled 2013-04-13 (×4): qty 1

## 2013-04-13 MED ORDER — POTASSIUM CHLORIDE CRYS ER 20 MEQ PO TBCR
40.0000 meq | EXTENDED_RELEASE_TABLET | Freq: Once | ORAL | Status: AC
  Administered 2013-04-13: 40 meq via ORAL
  Filled 2013-04-13: qty 2

## 2013-04-13 NOTE — Progress Notes (Signed)
Subjective:  No CP/SOB  Objective:  Temp:  [97.5 F (36.4 C)-99.3 F (37.4 C)] 98.2 F (36.8 C) (07/06 0418) Pulse Rate:  [75-88] 82 (07/06 0418) Resp:  [16-20] 20 (07/06 0418) BP: (138-159)/(61-81) 159/61 mmHg (07/06 0418) SpO2:  [95 %-97 %] 95 % (07/06 0753) Weight:  [258 lb (117.028 kg)] 258 lb (117.028 kg) (07/06 0418) Weight change: 9.6 oz (0.272 kg)  Intake/Output from previous day: 07/05 0701 - 07/06 0700 In: 240 [P.O.:240] Out: 600 [Urine:600]  Intake/Output from this shift:    Physical Exam: General appearance: alert and no distress Neck: no adenopathy, no carotid bruit, no JVD, supple, symmetrical, trachea midline and thyroid not enlarged, symmetric, no tenderness/mass/nodules Lungs: clear to auscultation bilaterally Heart: regular rate and rhythm, S1, S2 normal, no murmur, click, rub or gallop Extremities: trace BLE edema  Lab Results: Results for orders placed during the hospital encounter of 04/07/13 (from the past 48 hour(s))  GLUCOSE, CAPILLARY     Status: Abnormal   Collection Time    04/11/13 11:27 AM      Result Value Range   Glucose-Capillary 141 (*) 70 - 99 mg/dL   Comment 1 Notify RN    GLUCOSE, CAPILLARY     Status: Abnormal   Collection Time    04/11/13  4:26 PM      Result Value Range   Glucose-Capillary 172 (*) 70 - 99 mg/dL  GLUCOSE, CAPILLARY     Status: Abnormal   Collection Time    04/11/13  9:40 PM      Result Value Range   Glucose-Capillary 132 (*) 70 - 99 mg/dL  BASIC METABOLIC PANEL     Status: Abnormal   Collection Time    04/12/13  4:37 AM      Result Value Range   Sodium 140  135 - 145 mEq/L   Potassium 4.0  3.5 - 5.1 mEq/L   Chloride 103  96 - 112 mEq/L   CO2 29  19 - 32 mEq/L   Glucose, Bld 123 (*) 70 - 99 mg/dL   BUN 41 (*) 6 - 23 mg/dL   Creatinine, Ser 1.61 (*) 0.50 - 1.35 mg/dL   Calcium 8.1 (*) 8.4 - 10.5 mg/dL   GFR calc non Af Amer 37 (*) >90 mL/min   GFR calc Af Amer 42 (*) >90 mL/min   Comment:            The eGFR has been calculated     using the CKD EPI equation.     This calculation has not been     validated in all clinical     situations.     eGFR's persistently     <90 mL/min signify     possible Chronic Kidney Disease.  PROTIME-INR     Status: Abnormal   Collection Time    04/12/13  4:37 AM      Result Value Range   Prothrombin Time 15.8 (*) 11.6 - 15.2 seconds   INR 1.29  0.00 - 1.49  CBC     Status: Abnormal   Collection Time    04/12/13  4:37 AM      Result Value Range   WBC 13.6 (*) 4.0 - 10.5 K/uL   RBC 2.76 (*) 4.22 - 5.81 MIL/uL   Hemoglobin 8.4 (*) 13.0 - 17.0 g/dL   HCT 09.6 (*) 04.5 - 40.9 %   MCV 93.1  78.0 - 100.0 fL   MCH 30.4  26.0 - 34.0 pg  MCHC 32.7  30.0 - 36.0 g/dL   RDW 96.0  45.4 - 09.8 %   Platelets 217  150 - 400 K/uL  GLUCOSE, CAPILLARY     Status: Abnormal   Collection Time    04/12/13  6:06 AM      Result Value Range   Glucose-Capillary 133 (*) 70 - 99 mg/dL   Comment 1 Documented in Chart     Comment 2 Notify RN    GLUCOSE, CAPILLARY     Status: Abnormal   Collection Time    04/12/13 11:07 AM      Result Value Range   Glucose-Capillary 132 (*) 70 - 99 mg/dL   Comment 1 Notify RN    GLUCOSE, CAPILLARY     Status: Abnormal   Collection Time    04/12/13  4:19 PM      Result Value Range   Glucose-Capillary 112 (*) 70 - 99 mg/dL   Comment 1 Notify RN    GLUCOSE, CAPILLARY     Status: Abnormal   Collection Time    04/12/13  9:04 PM      Result Value Range   Glucose-Capillary 144 (*) 70 - 99 mg/dL  PROTIME-INR     Status: Abnormal   Collection Time    04/13/13  4:00 AM      Result Value Range   Prothrombin Time 16.5 (*) 11.6 - 15.2 seconds   INR 1.37  0.00 - 1.49  BASIC METABOLIC PANEL     Status: Abnormal   Collection Time    04/13/13  4:00 AM      Result Value Range   Sodium 139  135 - 145 mEq/L   Potassium 3.8  3.5 - 5.1 mEq/L   Chloride 104  96 - 112 mEq/L   CO2 30  19 - 32 mEq/L   Glucose, Bld 127 (*) 70 - 99 mg/dL    BUN 38 (*) 6 - 23 mg/dL   Creatinine, Ser 1.19  0.50 - 1.35 mg/dL   Calcium 7.9 (*) 8.4 - 10.5 mg/dL   GFR calc non Af Amer 58 (*) >90 mL/min   GFR calc Af Amer 67 (*) >90 mL/min   Comment:            The eGFR has been calculated     using the CKD EPI equation.     This calculation has not been     validated in all clinical     situations.     eGFR's persistently     <90 mL/min signify     possible Chronic Kidney Disease.  GLUCOSE, CAPILLARY     Status: Abnormal   Collection Time    04/13/13  6:14 AM      Result Value Range   Glucose-Capillary 126 (*) 70 - 99 mg/dL    Imaging: Imaging results have been reviewed  Assessment/Plan:   1. Principal Problem: 2.   Angina, class III - cath 04/07/13- 3V CAD 3. Active Problems: 4.   Invasive ductal carcinoma of breast, stage 2- 2012 s/p chemo and mastectomy 5.   Hodgkin's disease-1983 6.   Known RICA occlusion (CVA '07) partial LICA stenosis (40-59%) Jan 2014 7.   Hypercholesterolemia 8.   Claudication of calf muscles 9.   S/P CABG x 3  LIMA-LAD, free LRA to RI, SVG-RCA  - 04/09/13 10.   Chronic anticoagulation- Coumadin 11.   Time Spent Directly with Patient:  20 minutes  Length of Stay:  LOS: 6 days  POD #6 CABG X 3. NSR. VSS. Labs OK. Exam benign. Nl progression. On coumadin AC per pharm. Home per TCTS. Runell Gess 04/13/2013, 9:42 AM

## 2013-04-13 NOTE — Progress Notes (Addendum)
[address]       [place] [address]             [phone]      5 Days Post-Op  Procedure(s) (LRB): CORONARY ARTERY BYPASS GRAFTING (CABG) ([name]) INTRAOPERATIVE TRANSESOPHAGEAL ECHOCARDIOGRAM ([name]) RADIAL ARTERY HARVEST (Left) Subjective: Feels better, remains on O2  Objective  Telemetry sinus rhythm   Temp:  [97.5 F (36.4 C)-99.3 F (37.4 C)] 98.2 F (36.8 C) (07/06 0418) Pulse Rate:  [75-88] 82 (07/06 0418) Resp:  [16-20] 20 (07/06 0418) BP: (138-159)/(61-81) 159/61 mmHg (07/06 0418) SpO2:  [96 %-97 %] 96 % (07/06 0418) Weight:  [258 lb (117.028 kg)] 258 lb (117.028 kg) (07/06 0418)   Intake/Output Summary (Last 24 hours) at 04/13/13 0728 Last data filed at 04/13/13 0419  Gross per 24 hour  Intake    240 ml  Output    600 ml  Net   -360 ml       General appearance: alert, cooperative and no distress Heart: regular rate and rhythm Lungs: upper airway ronchi with scattered wheeze Abdomen: benign Extremities: mild edema Wound: minor serosang drainage from radial incision, otherwise all healing well  Lab Results:  Recent Labs  04/12/13 0437 04/13/13 0400  NA 140 139  K 4.0 3.8  CL 103 104  CO2 29 30  GLUCOSE 123* 127*  BUN 41* 38*  CREATININE 1.87* 1.28  CALCIUM 8.1* 7.9*   No results found for this basename: AST, ALT, ALKPHOS, BILITOT, PROT, ALBUMIN,  in the last 72 hours No results found for this basename: LIPASE, AMYLASE,  in the last 72 hours  Recent Labs  04/12/13 0437  WBC 13.6*  HGB 8.4*  HCT 25.7*  MCV 93.1  PLT 217   No results found for this basename: CKTOTAL, CKMB, TROPONINI,  in the last 72 hours No components found with this basename: POCBNP,  No results found for this basename: DDIMER,  in the last 72 hours No results found for this basename: HGBA1C,  in the last 72 hours No results found for this basename: CHOL, HDL, LDLCALC, TRIG, CHOLHDL,  in the last 72 hours No results found for this basename:  TSH, T4TOTAL, FREET3, T3FREE, THYROIDAB,  in the last 72 hours No results found for this basename: VITAMINB12, FOLATE, FERRITIN, TIBC, IRON, RETICCTPCT,  in the last 72 hours  Medications: Scheduled . amLODipine  5 mg Oral Daily  . aspirin EC  325 mg Oral Daily  . atorvastatin  80 mg Oral q1800  . citalopram  40 mg Oral Daily  . docusate sodium  200 mg Oral Daily  . furosemide  40 mg Oral Daily  . insulin aspart  0-24 Units Subcutaneous TID AC & HS  . isosorbide mononitrate  15 mg Oral Daily  . levalbuterol  0.63 mg Nebulization TID  . levothyroxine  50 mcg Oral QAC breakfast  . metoprolol tartrate  25 mg Oral BID  . pantoprazole  40 mg Oral QAC breakfast  . potassium chloride  20 mEq Oral Daily  . sodium chloride  3 mL Intravenous Q12H  . sodium chloride  3 mL Intravenous Q12H  . tamoxifen  10 mg Oral BID  . Warfarin - Physician Dosing Inpatient   Does not apply q1800     Radiology/Studies:  No results found.  INR:1.37 Will add last result for INR, ABG once components are confirmed Will add last 4 CBG results once components are confirmed  Assessment/Plan: S/P Procedure(s) (  LRB): CORONARY ARTERY BYPASS GRAFTING (CABG) ([name]) INTRAOPERATIVE TRANSESOPHAGEAL ECHOCARDIOGRAM ([name]) RADIAL ARTERY HARVEST (Left)   1 overall making progress, he feels better, cont aggressive pulm toilet/nebs. May have bronchitis component. Re-check CXR ti re-eval effusions 2 remains hypertensive, will increase norvasc dose 3 cont ac rx, d/c pw's 4 cont diuresis, creat improved    LOS: 6 days    GOLD,WAYNE E 7/6/20147:28 AM   Chart reviewed, patient examined, agree with above. No wheezing on exam today. He is still puffy and wt is 6 kg over preop. Will give a dose of IV lasix.

## 2013-04-14 LAB — PROTIME-INR
INR: 1.59 — ABNORMAL HIGH (ref 0.00–1.49)
Prothrombin Time: 18.5 seconds — ABNORMAL HIGH (ref 11.6–15.2)

## 2013-04-14 LAB — BASIC METABOLIC PANEL
Calcium: 8.3 mg/dL — ABNORMAL LOW (ref 8.4–10.5)
GFR calc Af Amer: 58 mL/min — ABNORMAL LOW (ref >90)
GFR calc non Af Amer: 50 mL/min — ABNORMAL LOW (ref >90)
Potassium: 4.2 mEq/L (ref 3.5–5.1)
Sodium: 138 mEq/L (ref 135–145)

## 2013-04-14 LAB — GLUCOSE, CAPILLARY: Glucose-Capillary: 129 mg/dL — ABNORMAL HIGH (ref 70–99)

## 2013-04-14 MED ORDER — METOPROLOL TARTRATE 50 MG PO TABS
50.0000 mg | ORAL_TABLET | Freq: Two times a day (BID) | ORAL | Status: DC
  Administered 2013-04-14 – 2013-04-16 (×5): 50 mg via ORAL
  Filled 2013-04-14 (×6): qty 1

## 2013-04-14 MED ORDER — WARFARIN SODIUM 5 MG PO TABS
5.0000 mg | ORAL_TABLET | Freq: Once | ORAL | Status: AC
  Administered 2013-04-14: 5 mg via ORAL
  Filled 2013-04-14: qty 1

## 2013-04-14 MED ORDER — FUROSEMIDE 10 MG/ML IJ SOLN
40.0000 mg | Freq: Once | INTRAMUSCULAR | Status: AC
  Administered 2013-04-14: 40 mg via INTRAVENOUS
  Filled 2013-04-14: qty 4

## 2013-04-14 MED ORDER — METOLAZONE 5 MG PO TABS
5.0000 mg | ORAL_TABLET | Freq: Every day | ORAL | Status: AC
  Administered 2013-04-14 – 2013-04-16 (×3): 5 mg via ORAL
  Filled 2013-04-14 (×3): qty 1

## 2013-04-14 NOTE — Progress Notes (Addendum)
       [address]       [place] [address]             [phone]          6 Days Post-Op Procedure(s) (LRB): CORONARY ARTERY BYPASS GRAFTING (CABG) ([name]) INTRAOPERATIVE TRANSESOPHAGEAL ECHOCARDIOGRAM ([name]) RADIAL ARTERY HARVEST (Left)  Subjective: Feels well, breathing improved subjectively. Appetite good this am. No complaints.   Objective: Vital signs in last 24 hours: Patient Vitals for the past 24 hrs:  BP Temp Temp src Pulse Resp SpO2  04/14/13 0437 182/91 mmHg 98.9 F (37.2 C) Oral 88 18 98 %  04/13/13 1956 156/57 mmHg 99 F (37.2 C) Oral 87 20 96 %  04/13/13 1427 137/67 mmHg 97.1 F (36.2 C) Oral 87 20 99 %  04/13/13 1256 - - - - - 97 %  04/13/13 0753 - - - - - 95 %   Current Weight  04/13/13 258 lb (117.028 kg)  PRE-OPERATIVE WEIGHT: 113kg       Intake/Output from previous day: 07/06 0701 - 07/07 0700 In: 720 [P.O.:720] Out: 2725 [Urine:2725]  CBGs 130-125-185-166   PHYSICAL EXAM:  Heart: RRR Lungs: Few basilar crackles, no wheezing Wound: Clean and dry Extremities: Mild LE edema    Lab Results: CBC: Recent Labs  04/12/13 0437  WBC 13.6*  HGB 8.4*  HCT 25.7*  PLT 217   BMET:  Recent Labs  04/13/13 0400 04/14/13 0500  NA 139 138  K 3.8 4.2  CL 104 99  CO2 30 27  GLUCOSE 127* 166*  BUN 38* 36*  CREATININE 1.28 1.44*  CALCIUM 7.9* 8.3*    PT/INR:  Recent Labs  04/14/13 0500  LABPROT 18.5*  INR 1.59*      Assessment/Plan: S/P Procedure(s) (LRB): CORONARY ARTERY BYPASS GRAFTING (CABG) ([name]) INTRAOPERATIVE TRANSESOPHAGEAL ECHOCARDIOGRAM ([name]) RADIAL ARTERY HARVEST (Left)  CV- SR, BPs remain elevated. Continue Norvasc, increase Lopressor.  Not on ACE-I due to elevated creatinine. Continue Imdur for RA graft, Coumadin for carotid stenosis.  Pulm- remains on 2L, responded well to IV Lasix yesterday.  Will repeat today, continue nebs, pulm toilet. Wean O2 as able.  ARI- Cr up slightly today.  Will  follow. ACE-I  d/c'ed.  Expected postop blood loss anemia- continue to monitor.  Hyperglycemia- continue SSI. Preop A1C= 5.5  Hopefully home soon if he continues to progress.  May need home O2.     LOS: 7 days    COLLINS,GINA H 04/14/2013  Check CXR in am and wean O2-- add zaroxyln to lasix for fluid overlaod

## 2013-04-14 NOTE — Progress Notes (Signed)
Pt amb 350 ft pushing rolling walker with two assist. O2 saturation before ambulation: 93% RA. During walk pt's O2 sats remained in low 90's. However, pt with some complaints of SOB. Upon return to room and placement back in chair, O2 sat dipped to 88% on RA. Positional changes made in chair and O2 sats increased to 94% on RA. Encouraged continued use of IS; Pt was able to obtain 1000 on IS. Goal set for 1250. Will continue to monitor pt closesly.

## 2013-04-14 NOTE — Progress Notes (Signed)
Pt remains off of O2. O2 sats were 93% on RA after pt ate dinner. Will continue to monitor pt closely.

## 2013-04-14 NOTE — Progress Notes (Signed)
CARDIAC REHAB PHASE I   PRE:  Rate/Rhythm: 95 SR with PACs    BP: sitting 189/97 lower left leg    SaO2: 98 2L, 90-94 RA  MODE:  Ambulation: 350 ft   POST:  Rate/Rhythm: 108 ST    BP: sitting 174/121 lower leg, 213/69 large cuff, 206/73 lower small cuff     SaO2: 91-94 RA, 88-89 RA sitting, reapplied O2  Pt c/o left should pain, severe. Sts he apparently hurt it last night getting out of chair however pt is not thinking clearly and cannot explain clearly. Very difficult to get pt to edge of chair and standing. Used gait belt and wife assisted. Once standing pt able to gingerly place left arm on RW with right arm. Sts he cannot lift left arm. Pt able to walk and increase distance. Labored breathing but SaO2 good walking. To recliner after walk. Needs O2 while resting but not while walking. Encouraged IS, which has been difficult for pt. Another trouble was pts BP, which can only be taken on left lower leg. Very high, not sure if inaccurate or correct. Rn aware. Pt seems to be somewhat confused although knows place and date. Will f/u. Instructed pt not to be up independently. Wife present and supportive. [phone]  [name] CES, ACSM 04/14/2013 10:17 AM

## 2013-04-14 NOTE — Progress Notes (Signed)
6 Days Post-Op Procedure(s) (LRB):  CORONARY ARTERY BYPASS GRAFTING (CABG) ([name])  INTRAOPERATIVE TRANSESOPHAGEAL ECHOCARDIOGRAM ([name])  RADIAL ARTERY HARVEST (Left)   Subjective: Up in chair, breathing stable, BM this am.   Artifact on tele, ? rhythm Objective: Vital signs in last 24 hours: Temp:  [97.1 F (36.2 C)-99 F (37.2 C)] 98.9 F (37.2 C) (07/07 0437) Pulse Rate:  [87-88] 88 (07/07 0437) Resp:  [18-20] 18 (07/07 0437) BP: (137-182)/(57-91) 182/91 mmHg (07/07 0437) SpO2:  [96 %-99 %] 98 % (07/07 0437) Weight change:  Last BM Date: 04/12/13 Intake/Output from previous day:  -2005  Wt 258 up from 257 yesterday 07/06 0701 - 07/07 0700 In: 720 [P.O.:720] Out: 2725 [Urine:2725] Intake/Output this shift:    PE: General:Pleasant affect, NAD Heart:RRR without murmur, gallup, rub or click Lungs:clear without rales, rhonchi, + exp  wheezes YNW:GNFA, non tender, + BS, do not palpate liver spleen or masses Ext:1+ R>L lower ext edema, 2+ pedal pulses, 2+ radial pulses Neuro:alert and oriented, MAE, follows commands, + facial symmetry   Lab Results:  Recent Labs  04/12/13 0437  WBC 13.6*  HGB 8.4*  HCT 25.7*  PLT 217   BMET  Recent Labs  04/13/13 0400 04/14/13 0500  NA 139 138  K 3.8 4.2  CL 104 99  CO2 30 27  GLUCOSE 127* 166*  BUN 38* 36*  CREATININE 1.28 1.44*  CALCIUM 7.9* 8.3*   No results found for this basename: TROPONINI, CK, MB,  in the last 72 hours  Lab Results  Component Value Date   CHOL 189 03/22/2011   HDL 38* 03/22/2011   LDLCALC 112* 03/22/2011   TRIG 194* 03/22/2011   CHOLHDL 5.0 03/22/2011   Lab Results  Component Value Date   HGBA1C 5.5 04/08/2013     Lab Results  Component Value Date   TSH 3.706 04/02/2013      Studies/Results: No results found.  Medications: I have reviewed the patient's current medications. Scheduled Meds: . amLODipine  10 mg Oral Daily  . aspirin EC  325 mg Oral Daily  . atorvastatin  80 mg Oral  q1800  . citalopram  40 mg Oral Daily  . docusate sodium  200 mg Oral Daily  . furosemide  40 mg Intravenous Once  . furosemide  40 mg Oral Daily  . insulin aspart  0-24 Units Subcutaneous TID AC & HS  . isosorbide mononitrate  15 mg Oral Daily  . levalbuterol  0.63 mg Nebulization TID  . levothyroxine  50 mcg Oral QAC breakfast  . metoprolol tartrate  50 mg Oral BID  . pantoprazole  40 mg Oral QAC breakfast  . potassium chloride  20 mEq Oral Daily  . sodium chloride  3 mL Intravenous Q12H  . sodium chloride  3 mL Intravenous Q12H  . tamoxifen  10 mg Oral BID  . warfarin  5 mg Oral ONCE-1800  . Warfarin - Physician Dosing Inpatient   Does not apply q1800   Continuous Infusions:  PRN Meds:.sodium chloride, sodium chloride, acetaminophen, alum & mag hydroxide-simeth, bisacodyl, bisacodyl, levalbuterol, magnesium hydroxide, ondansetron (ZOFRAN) IV, ondansetron (ZOFRAN) IV, ondansetron, ondansetron, oxyCODONE, sodium chloride, sodium chloride, sodium chloride, traMADol  Assessment/Plan: Principal Problem:   Angina, class III - cath 04/07/13- 3V CAD Active Problems:   Invasive ductal carcinoma of breast, stage 2- 2012 s/p chemo and mastectomy   Hodgkin's disease-1983   Known RICA occlusion (CVA '07) partial 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  INR 1.59    LOS: 7 days   Time spent with pt. :  15 minutes. [facility]  Nurse Practitioner Certified Pager [phone] 04/14/2013, 8:33 AM   Agree with note written by Nada Boozer RNP  POD # 7 CABG. Ambulating with [facility]. On coumadin AC. INR 1.59. Exam benign except for occasional wheezes. Getting breathing treatments. Nl progression per TCTS.   Runell Gess 04/14/2013 9:08 AM

## 2013-04-14 NOTE — Progress Notes (Signed)
Lt shoulder with decreased movement  Will notify TCTS

## 2013-04-15 ENCOUNTER — Inpatient Hospital Stay (HOSPITAL_COMMUNITY): Payer: BC Managed Care – PPO

## 2013-04-15 LAB — BASIC METABOLIC PANEL
BUN: 35 mg/dL — ABNORMAL HIGH (ref 6–23)
CO2: 30 mEq/L (ref 19–32)
Calcium: 8.2 mg/dL — ABNORMAL LOW (ref 8.4–10.5)
Chloride: 96 mEq/L (ref 96–112)
Creatinine, Ser: 1.32 mg/dL (ref 0.50–1.35)
GFR calc Af Amer: 65 mL/min — ABNORMAL LOW (ref >90)
Sodium: 135 mEq/L (ref 135–145)

## 2013-04-15 LAB — GLUCOSE, CAPILLARY: Glucose-Capillary: 122 mg/dL — ABNORMAL HIGH (ref 70–99)

## 2013-04-15 MED ORDER — FUROSEMIDE 10 MG/ML IJ SOLN: 40.0000 mg | Freq: Every day | INTRAMUSCULAR | Status: DC

## 2013-04-15 MED ORDER — POTASSIUM CHLORIDE CRYS ER 20 MEQ PO TBCR
20.0000 meq | EXTENDED_RELEASE_TABLET | Freq: Two times a day (BID) | ORAL | Status: DC
  Administered 2013-04-15 (×2): 20 meq via ORAL
  Filled 2013-04-15 (×3): qty 1

## 2013-04-15 MED ORDER — WARFARIN SODIUM 2.5 MG PO TABS
2.5000 mg | ORAL_TABLET | Freq: Once | ORAL | Status: AC
  Administered 2013-04-15: 2.5 mg via ORAL
  Filled 2013-04-15: qty 1

## 2013-04-15 MED ORDER — FUROSEMIDE 40 MG PO TABS: 40.0000 mg | ORAL_TABLET | Freq: Every day | ORAL | Status: DC

## 2013-04-15 MED ORDER — FUROSEMIDE 10 MG/ML IJ SOLN
40.0000 mg | Freq: Every day | INTRAMUSCULAR | Status: DC
  Administered 2013-04-15 – 2013-04-16 (×2): 40 mg via INTRAVENOUS
  Filled 2013-04-15 (×2): qty 4

## 2013-04-15 NOTE — Evaluation (Signed)
Physical Therapy Evaluation Patient Details Name: Ryan [name] MRN: [id number] DOB: 1951/04/24 Today's Date: 04/15/2013 Time: 9147-8295 PT Time Calculation (min): 20 min  PT Assessment / Plan / Recommendation History of Present Illness  CABG with left radial artery harvest  Clinical Impression  Pt demonstrates decreased functional mobility, awareness of precautions and decreased activity tolerance who will benefit from acute therapy to address deficits and maximize independence for discharge.     PT Assessment  Patient needs continued PT services    Follow Up Recommendations  No PT follow up;Other (comment) (outpatient cardiac rehab)    Does the patient have the potential to tolerate intense rehabilitation      Barriers to Discharge        Equipment Recommendations  None recommended by PT    Recommendations for Other Services     Frequency Min 3X/week    Precautions / Restrictions Precautions Precautions: Sternal Restrictions Weight Bearing Restrictions:  (sternal precautions)   Pertinent Vitals/Pain No pain sats 92% on RA HR 74     Mobility  Bed Mobility Bed Mobility: Rolling Right;Right Sidelying to Sit;Sitting - Scoot to [place] of Bed Rolling Right: 4: Min assist Right Sidelying to Sit: 4: Min assist;HOB flat Sitting - Scoot to Edge of Bed: 5: Supervision Details for Bed Mobility Assistance: cueing for sequence and precautions with assist to move LUE and elevate trunk Transfers Transfers: Sit to Stand;Stand to Sit Sit to Stand: From bed;5: Supervision Stand to Sit: To chair/3-in-1;5: Supervision Details for Transfer Assistance: cueing for hand placement Ambulation/Gait Ambulation/Gait Assistance: 5: Supervision Ambulation Distance (Feet): 300 Feet Assistive device: Rolling walker Ambulation/Gait Assistance Details: cueing for posture and position in RW Gait Pattern: Step-through pattern;Decreased stride length Gait velocity: decreased Stairs: Yes Stairs  Assistance: 4: Min guard Stair Management Technique: One rail Right Number of Stairs: 2    Exercises     PT Diagnosis: Difficulty walking  PT Problem List: Decreased activity tolerance;Decreased mobility;Decreased knowledge of precautions;Decreased knowledge of use of DME PT Treatment Interventions: DME instruction;Gait training;Therapeutic activities;Functional mobility training;Patient/family education;Therapeutic exercise     PT Goals(Current goals can be found in the care plan section) Acute Rehab PT Goals Patient Stated Goal: go home PT Goal Formulation: With patient/family Time For Goal Achievement: 04/22/13 Potential to Achieve Goals: Good  Visit Information  Last PT Received On: 04/15/13 Assistance Needed: +1 History of Present Illness: CABG with left radial artery harvest       Prior Functioning  Home Living Family/patient expects to be discharged to:: Private residence Living Arrangements: Spouse/significant other Available Help at Discharge: Family;Available 24 hours/day Type of Home: House Home Access: Stairs to enter [employer] of Steps: 2 Entrance Stairs-Rails: None Home Layout: One level Home Equipment: Walker - 2 wheels;Bedside commode;Cane - single point Prior Function Level of Independence: Independent Communication Communication: No difficulties    Cognition  Cognition Arousal/Alertness: Awake/alert Behavior During Therapy: WFL for tasks assessed/performed Overall Cognitive Status: Within Functional Limits for tasks assessed    Extremity/Trunk Assessment Upper Extremity Assessment Upper Extremity Assessment: Generalized weakness Lower Extremity Assessment Lower Extremity Assessment: Overall WFL for tasks assessed Cervical / Trunk Assessment Cervical / Trunk Assessment: Other exceptions Cervical / Trunk Exceptions: forward head   Balance    End of Session PT - End of Session Activity Tolerance: Patient tolerated treatment  well Patient left: in chair;with call bell/phone within reach;with family/visitor present Nurse Communication: Mobility status  GP     Delorse Lek 04/15/2013, 8:32 AM  Toney Sang  Avrohom Mckelvin, PT [phone]

## 2013-04-15 NOTE — Progress Notes (Signed)
CARDIAC REHAB PHASE I   PRE:  Rate/Rhythm: 76SR  BP:  Supine:   Sitting: 143/49  Standing:    SaO2: 95%RA  MODE:  Ambulation: 350 ft   POST:  Rate/Rhythm: 86SR  BP:  Supine:   Sitting: 105/84  Standing:    SaO2: 99%RA 1050-1130 Pt walked 350 ft on RA with gait belt use and rolling walker with steady gait. Rocked to get to standing position. Pt stopped several times and would close eyes. Tried to get pt to tell me if he was dizzy or not, and he said a little. Encouraged pt to keep eyes opened. Slow to respond to questions when asked how he was feeling. Encouraged slow deep breaths. Pt needed to go to bathroom so did not get to increase distance. To BSC and helped pt get cleaned up. Had BM. Assisted to recliner. Wife in room. Sats good on RA.   Luetta Nutting, RN BSN  04/15/2013 11:26 AM

## 2013-04-15 NOTE — Progress Notes (Signed)
Patient ambulated approximately 150 ft around the circle this morning with rolling walker and RN and tolerated well.  Slow and steady pace, did stop to take two brief breaks.  No SOB, HR stable.  O2 sats 92% on room air.  Left resting in chair with call bell within reach.  Will continue to monitor.   Arva Chafe

## 2013-04-15 NOTE — Progress Notes (Signed)
   Subjective:  Feels better.  SaO2 improved (despite CXR findings). Tele:  Objective:  Vital Signs in the last 24 hours: Temp:  [97.9 F (36.6 C)-98.7 F (37.1 C)] 97.9 F (36.6 C) (07/08 0606) Pulse Rate:  [73-79] 73 (07/08 0606) Resp:  [16-18] 16 (07/08 0606) BP: (134-154)/(55-77) 154/68 mmHg (07/08 0606) SpO2:  [89 %-98 %] 92 % (07/08 0828) FiO2 (%):  [28 %] 28 % (07/07 2048) Weight:  [250 lb 1.6 oz (113.445 kg)] 250 lb 1.6 oz (113.445 kg) (07/08 0641)  Intake/Output from previous day: 07/07 0701 - 07/08 0700 In: 684 [P.O.:684] Out: 2860 [Urine:2860] Intake/Output from this shift: Total I/O In: 111 [P.O.:111] Out: 300 [Urine:300]  Physical Exam: General appearance: alert, cooperative, appears stated age and no distress Lungs: diminished breath sounds bibasilar and wheezes bilaterally Heart: regular rate and rhythm, S1, S2 normal, no murmur, click, rub or gallop Abdomen: soft, non-tender; bowel sounds normal; no masses,  no organomegaly Extremities: edema ~1+ U&LE. Pulses: 2+ and symmetric Neurologic: Grossly normal  Lab Results: No results found for this basename: WBC, HGB, PLT,  in the last 72 hours  Recent Labs  04/14/13 0500 04/15/13 0500  NA 138 135  K 4.2 3.4*  CL 99 96  CO2 27 30  GLUCOSE 166* 107*  BUN 36* 35*  CREATININE 1.44* 1.32   Imaging: Imaging results have been reviewed -- ? Increased R pl effusion & ? L pulm edema  Assessment/Plan:  Principal Problem:   Angina, class III - cath 04/07/13- 3V CAD Active Problems:   Invasive ductal carcinoma of breast, stage 2- 2012 s/p chemo and mastectomy   Known RICA occlusion (CVA '07) partial LICA stenosis (40-59%) Jan 2014   Hypercholesterolemia   Claudication of calf muscles   Hodgkin's [phone]   S/P CABG x 3  LIMA-LAD, free LRA to RI, SVG-RCA  - 04/09/13   Chronic anticoagulation- Coumadin   Slow progression post PCI.  BP is still up a bit, but on multiple medications. - Next choice would  be convert Metoprolol to Carvediolol @ 12.5 mg bid.  Continue slow diuresis with Lasix & metolazone per TCTS (CXR with ? Increased pulm edema).  INR almost therapeutic ==> for carotid disease, ?? If warfarin is necessary vs. Antiplatelet agent.  ?? Pt mentioned a potential thoracentesis procedure --> INR ~2 may make this less favorable.   He notes chronic wheezing. - so this is not new.  PPI  Anticipate d/c in ~1-2 days, per TCTS.   LOS: 8 days    HARDING,DAVID W 04/15/2013, 9:11 AM

## 2013-04-15 NOTE — Progress Notes (Addendum)
       [address]       [place] [address]             [phone]          7 Days Post-Op Procedure(s) (LRB): CORONARY ARTERY BYPASS GRAFTING (CABG) ([name]) INTRAOPERATIVE TRANSESOPHAGEAL ECHOCARDIOGRAM ([name]) RADIAL ARTERY HARVEST (Left)  Subjective: Feels much better this am.  Breathing better, walked off O2 this am and sats 97%.    Objective: Vital signs in last 24 hours: Patient Vitals for the past 24 hrs:  BP Temp Temp src Pulse Resp SpO2 Weight  04/15/13 0641 - - - - - 92 % 250 lb 1.6 oz (113.445 kg)  04/15/13 0606 154/68 mmHg 97.9 F (36.6 C) Oral 73 16 98 % -  04/14/13 2048 - - - - - 92 % -  04/14/13 1948 134/55 mmHg 98.7 F (37.1 C) Oral 79 18 93 % -  04/14/13 1500 143/77 mmHg 98 F (36.7 C) Oral 79 18 95 % -  04/14/13 1444 - - - - - 89 % -  04/14/13 1027 149/71 mmHg - - - - - -   Current Weight  04/15/13 250 lb 1.6 oz (113.445 kg)  PRE-OPERATIVE WEIGHT: 113kg     Intake/Output from previous day: 07/07 0701 - 07/08 0700 In: 684 [P.O.:684] Out: 2860 [Urine:2860]  CBGs 129-117-107-110   PHYSICAL EXAM:  Heart: RRR Lungs: Dull R base, no wheezing Wound: Clean and dry Extremities: Mild LE edema, significantly improved    Lab Results: CBC:No results found for this basename: WBC, HGB, HCT, PLT,  in the last 72 hours BMET:  Recent Labs  04/14/13 0500 04/15/13 0500  NA 138 135  K 4.2 3.4*  CL 99 96  CO2 27 30  GLUCOSE 166* 107*  BUN 36* 35*  CREATININE 1.44* 1.32  CALCIUM 8.3* 8.2*    PT/INR:  Recent Labs  04/15/13 0500  LABPROT 21.8*  INR 1.97*   CXR: IMPRESSION:  1. Stable positioning of support apparatus. No pneumothorax.  2. Suspected minimal increase in now moderate sized layering right-  sided pleural effusion. Unchanged trace left-sided pleural  effusion.  3. Suspected slight worsening in asymmetric left-sided pulmonary  edema.    Assessment/Plan: S/P Procedure(s) (LRB): CORONARY ARTERY BYPASS GRAFTING  (CABG) ([name]) INTRAOPERATIVE TRANSESOPHAGEAL ECHOCARDIOGRAM ([name]) RADIAL ARTERY HARVEST (Left)  CV- SR, BPs still high, but improving. Continue Norvasc, Lopressor. Not on ACE-I due to elevated creatinine. Continue Imdur for RA graft, Coumadin for carotid stenosis.  Vol overload- wt stable, but significantly decreased edema.  Continue Lasix/Zaroxoyln.  Hypokalemia- replace K+.  Pulm- currently off O2, breathing stable. R effusion unchanged on CXR.  May need to consider thoracentesis while INR is subtherapeutic.  ARI- Cr back down. Will follow. ACE-I d/c'ed.   Expected postop blood loss anemia- continue to monitor.   Hyperglycemia- continue SSI. Preop A1C= 5.5   Hopefully home in next 1-2 days if he remains stable.   LOS: 8 days    COLLINS,GINA H 04/15/2013  Follow R pl effusion with CXR in AM Cont Iv lasix and po metazolone  patient examined and medical record reviewed,agree with above note. VAN TRIGT III,Camielle Sizer 04/15/2013

## 2013-04-16 ENCOUNTER — Inpatient Hospital Stay (HOSPITAL_COMMUNITY): Payer: BC Managed Care – PPO

## 2013-04-16 DIAGNOSIS — E7800 Pure hypercholesterolemia, unspecified: Secondary | ICD-10-CM

## 2013-04-16 LAB — PROTIME-INR
INR: 2.04 — ABNORMAL HIGH (ref 0.00–1.49)
Prothrombin Time: 22.4 seconds — ABNORMAL HIGH (ref 11.6–15.2)

## 2013-04-16 LAB — BASIC METABOLIC PANEL
BUN: 35 mg/dL — ABNORMAL HIGH (ref 6–23)
CO2: 33 mEq/L — ABNORMAL HIGH (ref 19–32)
Calcium: 8.3 mg/dL — ABNORMAL LOW (ref 8.4–10.5)
Chloride: 95 mEq/L — ABNORMAL LOW (ref 96–112)
Creatinine, Ser: 1.24 mg/dL (ref 0.50–1.35)
GFR calc Af Amer: 70 mL/min — ABNORMAL LOW (ref >90)
GFR calc non Af Amer: 60 mL/min — ABNORMAL LOW (ref >90)
Glucose, Bld: 114 mg/dL — ABNORMAL HIGH (ref 70–99)
Potassium: 3.2 mEq/L — ABNORMAL LOW (ref 3.5–5.1)
Sodium: 134 mEq/L — ABNORMAL LOW (ref 135–145)

## 2013-04-16 LAB — GLUCOSE, CAPILLARY
Glucose-Capillary: 114 mg/dL — ABNORMAL HIGH (ref 70–99)
Glucose-Capillary: 122 mg/dL — ABNORMAL HIGH (ref 70–99)

## 2013-04-16 MED ORDER — WARFARIN SODIUM 2.5 MG PO TABS
2.5000 mg | ORAL_TABLET | Freq: Once | ORAL | Status: AC
  Administered 2013-04-16: 2.5 mg via ORAL
  Filled 2013-04-16: qty 1

## 2013-04-16 MED ORDER — ASPIRIN EC 81 MG PO TBEC
81.0000 mg | DELAYED_RELEASE_TABLET | Freq: Every day | ORAL | Status: DC
  Administered 2013-04-16: 81 mg via ORAL
  Filled 2013-04-16: qty 1

## 2013-04-16 MED ORDER — POTASSIUM CHLORIDE CRYS ER 20 MEQ PO TBCR
40.0000 meq | EXTENDED_RELEASE_TABLET | Freq: Two times a day (BID) | ORAL | Status: DC
  Administered 2013-04-16: 40 meq via ORAL

## 2013-04-16 MED ORDER — OXYCODONE HCL 5 MG PO TABS: 5.0000 mg | ORAL_TABLET | ORAL | Status: DC | PRN

## 2013-04-16 MED ORDER — AMLODIPINE BESYLATE 10 MG PO TABS: 10.0000 mg | ORAL_TABLET | Freq: Every day | ORAL | Status: DC

## 2013-04-16 MED ORDER — FUROSEMIDE 40 MG PO TABS: 40.0000 mg | ORAL_TABLET | Freq: Every day | ORAL | Status: DC

## 2013-04-16 MED ORDER — METOPROLOL TARTRATE 50 MG PO TABS: 50.0000 mg | ORAL_TABLET | Freq: Two times a day (BID) | ORAL | Status: DC

## 2013-04-16 MED ORDER — POTASSIUM CHLORIDE CRYS ER 20 MEQ PO TBCR: 20.0000 meq | EXTENDED_RELEASE_TABLET | Freq: Every day | ORAL | Status: DC

## 2013-04-16 MED ORDER — ISOSORBIDE MONONITRATE 15 MG HALF TABLET: 15.0000 mg | ORAL_TABLET | Freq: Every day | ORAL | Status: DC

## 2013-04-16 NOTE — Progress Notes (Signed)
Central line dcd per protocol, tip intact pressure held, and pressure dressing applied. Patient reminded to lie supine for 30 minutes. And dressing is to stay on for 24 hours. Will continue tomonitor patient . Letasha Kershaw, Randall An RN

## 2013-04-16 NOTE — Progress Notes (Signed)
Physical Therapy Treatment Patient Details Name: Ryan [name] MRN: [id number] DOB: 15-Oct-1950 Today's Date: 04/16/2013 Time: 3086-5784 PT Time Calculation (min): 49 min  PT Assessment / Plan / Recommendation  PT Comments   Pt s/p cabg presents with vastly improved mobility and independence including ability to walk with an assistive device today.  Pt and spouse express comfort with plan to return home and continue cardiac rehab.  Most acute PT goals met, and performance is sufficiently safe for d/c once medically ready. Will remain on case until all goals met or pt d/c and continue to work with dynamic balance and consistently safe transitional movements.  See below for details of session today.  Follow Up Recommendations  No PT follow up (instructed pt to request PT after d/c if mobility declines)     Does the patient have the potential to tolerate intense rehabilitation     Barriers to Discharge        Equipment Recommendations  None recommended by PT    Recommendations for Other Services    Frequency     Progress towards PT Goals Progress towards PT goals: Progressing toward goals  Plan Current plan remains appropriate    Precautions / Restrictions Precautions Precautions: Sternal Precaution Comments: pt/spouse independent using cough pillow for bed mobility and transfer activity.  Did discuss risks of UE use with pt and demonstrated modifications to bed mobility and sit<>stand (see those sections below) Restrictions Weight Bearing Restrictions:  (sternal precautions)   Pertinent Vitals/Pain Minimal soreness in sternum    Mobility  Bed Mobility Bed Mobility: Rolling Right;Right Sidelying to Sit;Sit to Sidelying Right Rolling Right: 5: Supervision Right Sidelying to Sit: 4: Min assist;HOB flat Sitting - Scoot to Edge of Bed: 5: Supervision Sit to Sidelying Right: 5: Supervision;HOB flat Details for Bed Mobility Assistance: instruction on optimal use of legs for leverage  to counterweight trunk side to sit and to control descent sit to side.  Demonstrated for spouse physical assist for side>sit gently bracing leg and touch cues to trunk to increase comfort and safety with technique.  Pt able to return demonstrate x2 with occassional instructional cue to correct and to verify technique Transfers Transfers: Sit to Stand;Stand to Sit Sit to Stand: From bed;From chair/3-in-1;Without upper extremity assist;4: Min guard Stand to Sit: To chair/3-in-1;Without upper extremity assist;5: Supervision Details for Transfer Assistance: instruction for scoot to edge, wide foot placement, foward head to facilitate unweight hip/load legs and maintain 0% UE use for sternal protection.  Pt able to return demonstrate slow descent using head/hips counterweight to low surface with confirmatory cues as well as sit>stand from low surface and from elevate bed (to simulate home environment).  Demonstrated safe assist techniques for spouse who verbalized understanding along with patient.  Pt endorses new techniques as easier and less painful to sternum.  Instructed to practice technique with each instance of sit<>stand to improve leg strength and control Ambulation/Gait Ambulation/Gait Assistance: 5: Supervision Ambulation Distance (Feet): 250 Feet Assistive device: None Ambulation/Gait Assistance Details: transitioned pt OFF rolling walker, instruction for optimal breathing technique and speed management to minimize episodes of dyspnea (pt with minimal dyspnea observed/reported with activity); instructed on optimal speed to faciliate normal gait pattern and minimize effort of balance systems.  Pt/spouse report he 'has never been a fast walker'.  Pt reports walking feels easier without the rolling walker.  Removed device from room.   Gait Pattern: Step-through pattern;Scissoring Gait velocity: 1.71 ft/sec General Gait Details: gait velocity below optimal levels, with  occassional mild cross-over  stepping due to loss of balance that patient easily self-corrects; risk for balance difficulty minimal but increases when pt uses slower gait and is distracted by environment.  Pt verbalized understanding of risk and impact of gait speed on breathing and on balance and encourged to find an optimal speed. Stairs: No    Exercises     PT Diagnosis:    PT Problem List:   PT Treatment Interventions:     PT Goals (current goals can now be found in the care plan section)    Visit Information  Last PT Received On: 04/16/13 Assistance Needed: +1 History of Present Illness: CABG with left radial artery harvest    Subjective Data  Subjective: I'm feeling much better.  Moving is easier   Cognition  Cognition Arousal/Alertness: Awake/alert Behavior During Therapy: WFL for tasks assessed/performed Overall Cognitive Status: Within Functional Limits for tasks assessed    Balance     End of Session PT - End of Session Equipment Utilized During Treatment: Gait belt Activity Tolerance: Patient tolerated treatment well Patient left: in chair;with call bell/phone within reach;with family/visitor present Nurse Communication: Mobility status   GP     Dennis Bast 04/16/2013, 11:08 AM

## 2013-04-16 NOTE — Discharge Summary (Signed)
patient examined and medical record reviewed,agree with above note- discharge summary. VAN TRIGT III,PETER 04/16/2013

## 2013-04-16 NOTE — Progress Notes (Signed)
       [address]       [place] [address]             [phone]          8 Days Post-Op Procedure(s) (LRB): CORONARY ARTERY BYPASS GRAFTING (CABG) ([name]) INTRAOPERATIVE TRANSESOPHAGEAL ECHOCARDIOGRAM ([name]) RADIAL ARTERY HARVEST (Left)  Subjective: Feels much better today overall.  Breathing stable, no complaints.    Objective: Vital signs in last 24 hours: Patient Vitals for the past 24 hrs:  BP Temp Temp src Pulse Resp SpO2 Weight  04/16/13 0515 150/66 mmHg 98.1 F (36.7 C) Oral 71 16 93 % 241 lb 14.4 oz (109.725 kg)  04/15/13 1944 135/54 mmHg 98.3 F (36.8 C) Oral 75 16 92 % -  04/15/13 1443 127/47 mmHg 98.6 F (37 C) Oral 75 16 92 % -  04/15/13 1002 143/49 mmHg - - 80 - - -  04/15/13 0953 - - - - - 92 % -  04/15/13 0828 - - - - - 92 % -   Current Weight  04/16/13 241 lb 14.4 oz (109.725 kg)  PRE-OPERATIVE WEIGHT: 113kg     Intake/Output from previous day: 07/08 0701 - 07/09 0700 In: 222 [P.O.:222] Out: 3040 [Urine:3040]  CBGs 122-110-114-114    PHYSICAL EXAM:  Heart: RRR Lungs:Dull R base Wound: Clean and dry Extremities:Mild LE edema, R>L    Lab Results: CBC:No results found for this basename: WBC, HGB, HCT, PLT,  in the last 72 hours BMET:  Recent Labs  04/15/13 0500 04/16/13 0500  NA 135 134*  K 3.4* 3.2*  CL 96 95*  CO2 30 33*  GLUCOSE 107* 114*  BUN 35* 35*  CREATININE 1.32 1.24  CALCIUM 8.2* 8.3*    PT/INR:  Recent Labs  04/16/13 0500  LABPROT 22.4*  INR 2.04*    CXR: stable R effusion   Assessment/Plan: S/P Procedure(s) (LRB): CORONARY ARTERY BYPASS GRAFTING (CABG) ([name]) INTRAOPERATIVE TRANSESOPHAGEAL ECHOCARDIOGRAM ([name]) RADIAL ARTERY HARVEST (Left) CV- SR, BPs improving. Continue Norvasc, Lopressor. Continue Imdur for RA graft, Coumadin for carotid stenosis.  Vol overload- improving. Continue Lasix/Zaroxoyln.  Hypokalemia- replace K+. Pulm- currently off O2, breathing stable. R effusion  unchanged on CXR. Watch for now.   ARI- Cr trending down. Continue to monitor. ACE-I d/c'ed.  Expected postop blood loss anemia- continue to monitor.   Hyperglycemia- continue SSI. Preop A1C= 5.5   Hopefully home soon.   LOS: 9 days    Lamondre Wesche H 04/16/2013

## 2013-04-16 NOTE — Progress Notes (Signed)
[facility]  Subjective: No chest pain or SOB. Ambulating with CR with little difficulty.  Objective: Vital signs in last 24 hours: Temp:  [98.1 F (36.7 C)-98.6 F (37 C)] 98.1 F (36.7 C) (07/09 0515) Pulse Rate:  [71-75] 71 (07/09 0515) Resp:  [16] 16 (07/09 0515) BP: (127-150)/(47-66) 150/66 mmHg (07/09 0515) SpO2:  [92 %-94 %] 94 % (07/09 0817) Weight:  [241 lb 14.4 oz (109.725 kg)] 241 lb 14.4 oz (109.725 kg) (07/09 0515) Last BM Date: 04/15/13  Intake/Output from previous day: 07/08 0701 - 07/09 0700 In: 222 [P.O.:222] Out: 3040 [Urine:3040] Intake/Output this shift:    Medications Current Facility-Administered Medications  Medication Dose Route Frequency Provider Last Rate Last Dose  . acetaminophen (TYLENOL) tablet 650 mg  650 mg Oral Q6H PRN Wayne E Gold, PA-C      . alum & mag hydroxide-simeth (MAALOX/MYLANTA) 200-200-20 MG/5ML suspension 15-30 mL  15-30 mL Oral Q4H PRN Wayne E Gold, PA-C      . amLODipine (NORVASC) tablet 10 mg  10 mg Oral Daily Wayne E Gold, PA-C   10 mg at 04/15/13 0957  . aspirin EC tablet 81 mg  81 mg Oral Daily Gina L Collins, PA-C      . atorvastatin (LIPITOR) tablet 80 mg  80 mg Oral q1800 Wayne E Gold, PA-C   80 mg at 04/15/13 1827  . bisacodyl (DULCOLAX) EC tablet 10 mg  10 mg Oral Daily PRN Rowe Clack, PA-C       Or  . bisacodyl (DULCOLAX) suppository 10 mg  10 mg Rectal Daily PRN Rowe Clack, PA-C   10 mg at 04/12/13 1346  . citalopram (CELEXA) tablet 40 mg  40 mg Oral Daily Rowe Clack, PA-C   40 mg at 04/15/13 1610  . docusate sodium (COLACE) capsule 200 mg  200 mg Oral Daily Wayne E Gold, PA-C   200 mg at 04/15/13 0957  . furosemide (LASIX) injection 40 mg  40 mg Intravenous Daily Wilmon Pali, PA-C   40 mg at 04/15/13 0958  . insulin aspart (novoLOG) injection 0-24 Units  0-24 Units Subcutaneous TID AC & HS Rowe Clack, PA-C   2 Units at 04/15/13 1827  . isosorbide mononitrate (IMDUR) 24 hr  tablet 15 mg  15 mg Oral Daily Kerin Perna, MD   15 mg at 04/15/13 1002  . levalbuterol (XOPENEX) nebulizer solution 0.63 mg  0.63 mg Nebulization TID Kerin Perna, MD   0.63 mg at 04/16/13 0814  . levalbuterol (XOPENEX) nebulizer solution 0.63 mg  0.63 mg Nebulization Q4H PRN Kerin Perna, MD      . levothyroxine (SYNTHROID, LEVOTHROID) tablet 50 mcg  50 mcg Oral QAC breakfast Rowe Clack, PA-C   50 mcg at 04/16/13 9604  . magnesium hydroxide (MILK OF MAGNESIA) suspension 30 mL  30 mL Oral Daily PRN Wayne E Gold, PA-C      . metolazone (ZAROXOLYN) tablet 5 mg  5 mg Oral Daily Kerin Perna, MD   5 mg at 04/15/13 0957  . metoprolol (LOPRESSOR) tablet 50 mg  50 mg Oral BID Wilmon Pali, PA-C   50 mg at 04/15/13 2140  . ondansetron (ZOFRAN) tablet 4 mg  4 mg Oral Q6H PRN Wayne E Gold, PA-C       Or  . ondansetron (ZOFRAN) injection 4 mg  4 mg Intravenous Q6H PRN Rowe Clack, PA-C      .  ondansetron (ZOFRAN) tablet 4 mg  4 mg Oral Q6H PRN Kerin Perna, MD       Or  . ondansetron [facility]) injection 4 mg  4 mg Intravenous Q6H PRN Kerin Perna, MD      . oxyCODONE (Oxy IR/ROXICODONE) immediate release tablet 5-10 mg  5-10 mg Oral Q3H PRN Rowe Clack, PA-C   5 mg at 04/14/13 1043  . pantoprazole (PROTONIX) EC tablet 40 mg  40 mg Oral QAC breakfast Rowe Clack, PA-C   40 mg at 04/16/13 4540  . potassium chloride SA (K-DUR,KLOR-CON) CR tablet 40 mEq  40 mEq Oral BID Wilmon Pali, PA-C      . sodium chloride 0.9 % injection 10-40 mL  10-40 mL Intracatheter PRN Kerin Perna, MD   10 mL at 04/16/13 0501  . tamoxifen (NOLVADEX) tablet 10 mg  10 mg Oral BID Rowe Clack, PA-C   10 mg at 04/15/13 2140  . traMADol (ULTRAM) tablet 50-100 mg  50-100 mg Oral Q4H PRN Rowe Clack, PA-C   100 mg at 04/10/13 1845  . warfarin (COUMADIN) tablet 2.5 mg  2.5 mg Oral ONCE-1800 Gina L Collins, PA-C      . Warfarin - Physician Dosing Inpatient   Does not apply q1800 Kerin Perna, MD         PE: General appearance: alert, cooperative and no distress Lungs: clear to auscultation bilaterally Heart: regular rate and rhythm Extremities: trace bilateral LEE Pulses: 2+ and symmetric Skin: warm and dry Neurologic: Grossly normal  Lab Results:  No results found for this basename: WBC, HGB, HCT, PLT,  in the last 72 hours BMET  Recent Labs  04/14/13 0500 04/15/13 0500 04/16/13 0500  NA 138 135 134*  K 4.2 3.4* 3.2*  CL 99 96 95*  CO2 27 30 33*  GLUCOSE 166* 107* 114*  BUN 36* 35* 35*  CREATININE 1.44* 1.32 1.24  CALCIUM 8.3* 8.2* 8.3*   PT/INR  Recent Labs  04/14/13 0500 04/15/13 0500 04/16/13 0500  LABPROT 18.5* 21.8* 22.4*  INR 1.59* 1.97* 2.04*    Assessment/Plan  Principal Problem:   Angina, class III - cath 04/07/13- 3V CAD Active Problems:   Invasive ductal carcinoma of breast, stage 2- 2012 s/p chemo and mastectomy   Hodgkin's disease-1983   Known RICA occlusion (CVA '07) partial LICA stenosis (40-59%) Jan 2014   Hypercholesterolemia   Claudication of calf muscles   S/P CABG x 3  LIMA-LAD, free LRA to RI, SVG-RCA  - 04/09/13   Chronic anticoagulation- Coumadin  Plan: S/P CABG x 3 LIMA-LAD, free LRA to RI, SVG-RCA. POD # 8. No chest pain/SOB. NSR on telemetry. Still moderately hypertensive with SBP in the 150s. He is on 50 mg of Lopressor, amlodipine and Imdur. ? Adding an ACE-I. Renal function is stable. Continue with Lasix for fluid removal. Continue with supplemental K+ for hypokalemia. Will arrange f/u with Dr. Herbie Baltimore, once stable for discharge.      LOS: 9 days    Brittainy M. Delmer Islam 04/16/2013 10:45 AM   Patient seen and examined. Agree with assessment and plan. Ambulating with increased vigor today. INR therapeutic. No significant edema. DC metolazone; continue lasix; K replete.  Consider low dose ACE-I.    Lennette Bihari, MD, [facility] 04/16/2013 11:08 AM

## 2013-04-16 NOTE — Progress Notes (Signed)
CARDIAC REHAB PHASE I   PRE:  Rate/Rhythm: 70SR  BP:  Supine:   Sitting: 101/80  Standing:    SaO2: 95%RA  MODE:  Ambulation: 450 ft   POST:  Rate/Rhythm: 80  BP:  Supine:   Sitting: 111/55  Standing:    SaO2: 98%RA 1330-1407 Pt walked 450 ft on RA with asst x 1 with slight unsteady gait. Did not use rolling walker with PT and wife stated he was steadier this am. Had a couple of LOB with me. Encouraged pt to make sure he walks with someone as he gets wobbly when tired. To recliner after walk. Stopped twice to rest. Education completed with pt and wife. Understanding voiced. Permission given to refer to [place] [facility]. Put on d/c [facility] video for pt and wife to watch.   Luetta Nutting, RN BSN  04/16/2013 2:04 PM

## 2013-04-16 NOTE — Progress Notes (Signed)
04/16/13 Nursing  Patient give discharge instructions and AVS with paper prescriptions. Discharge instructions reviewed with patient and wife. All questions were answered will discharge home as ordered. Anyiah Coverdale, Randall An RN

## 2013-04-16 NOTE — Discharge Summary (Signed)
[address]       [place] [address]             [phone]              Discharge Summary  Name: Ryan [name] DOB: September 27, 1951 62 y.o. MRN: [id number]   Admission Date: 04/07/2013 Discharge Date: 04/16/2013    Admitting Diagnosis: Chest pain    Discharge Diagnosis:  Severe left main/3 vessel coronary artery disease Unstable angina Expected postoperative blood loss anemia Postoperative acute renal insufficiency Right pleural effusion   Past Medical History  Diagnosis Date  . Invasive ductal carcinoma of breast, stage 2 04/27/2011    chemo, mastectomy  . Right carotid artery occlusion 04/27/2011  . Left carotid artery partial occlusion 04/27/2011  . Stroke     2007  . Hypercholesterolemia   . Adie's pupil   . H/O Hodgkin's disease 81  . Thyroid disease   . S/P CABG x 3  left internal mammary artery to left anterior descending, left radial artery free graft to ramus intermediate, saphenous vein graft to right coronary artery).  04/09/2013  . CAD (coronary artery disease), native coronary artery 04/09/2013     Procedures: CORONARY ARTERY BYPASS GRAFTING x 3 - 04/08/2013  (Left internal mammary artery to left anterior descending, free left radial artery to ramus intermediate, saphenous vein graft to right coronary artery)  ENDOSCOPIC VEIN HARVEST RIGHT LEG  LEFT RADIAL ARTERY HARVEST     HPI:  The patient is a 62 y.o. male with a several week history of worsening dyspnea on exertion, exercise intolerance, and increasing chest heaviness.  Initially, the discomfort occurred only with significant exertion, but now he gets smothering pressure in his chest with minimal exertion.  It is associated with nausea and shortness of breath.  He saw his primary MD, Dr. [name], and was subsequently referred to [facility] for cardiac workup.  Dr. Herbie Baltimore saw the patient and recommended catheterization, which was performed on  04/07/2013.  This revealed 80% left main stenosis, severe three vessel disease, and EF 60%.  He was admitted following cath for further evaluation and treatment.    Hospital Course:  The patient was admitted to [facility] on 04/07/2013. A cardiac surgery consult was requested and Dr. Donata Clay saw the patient and reviewed his films.  He agreed that he would need surgical revascularization. All risks, benefits and alternatives of surgery were explained in detail, and the patient agreed to proceed. The patient was taken to the operating room and underwent the above procedure.    The postoperative course postoperative course has generally been uneventful.  He required a transfusion for blood loss anemia early on, and his blood counts have since improved.  He has been hypertensive, and blood pressure medications have been titrated accordingly.  He was started on an ACE-I, but his creatinine became elevated, and this was discontinued.  He developed a right pleural effusion with significant volume overload, and was aggressively diuresed with IV Lasix and po Zaroxolyn.  He has been restarted on Coumadin for his carotid disease.  He has also been started on a low dose aspirin, beta blocker and statin.  He is ambulating in the halls without difficulty and tolerating a regular diet.  He is diuresing well, and is below his preop weight.  INR is therapeutic. The right pleural effusion is stable on chest x-ray, and he is maintaining O2 sats of >90% on room air.  His blood pressures have remained fairly stable. He was evaluated on today's date and is medically stable for discharge home.        Recent vital signs:  Filed Vitals:   04/16/13 0515  BP: 150/66  Pulse: 71  Temp: 98.1 F (36.7 C)  Resp: 16    Recent laboratory studies:  CBC:No results found for this basename: WBC, HGB, HCT, PLT,  in the last 72 hours BMET:  Recent Labs  04/15/13 0500 04/16/13 0500  NA 135 134*  K 3.4* 3.2*  CL 96 95*  CO2  30 33*  GLUCOSE 107* 114*  BUN 35* 35*  CREATININE 1.32 1.24  CALCIUM 8.2* 8.3*    PT/INR:  Recent Labs  04/16/13 0500  LABPROT 22.4*  INR 2.04*     Discharge Medications:     Medication List    STOP taking these medications       carvedilol 3.125 MG tablet  Commonly known as:  COREG      TAKE these medications       ALPRAZolam 1 MG tablet  Commonly known as:  XANAX  Take 1 mg by mouth at bedtime as needed for anxiety. Take 0.5 mg by mouth at bedtime as needed for anxiety     amLODipine 10 MG tablet  Commonly known as:  NORVASC  Take 1 tablet (10 mg total) by mouth daily.     aspirin EC 81 MG tablet  Take 162 mg by mouth daily.     citalopram 40 MG tablet  Commonly known as:  CELEXA  Take 40 mg by mouth daily.     Coconut Oil 1000 MG Caps  Take 1,000 mg by mouth daily.     Fish Oil 1000 MG Cpdr  Take 1 each by mouth daily.     furosemide 40 MG tablet  Commonly known as:  LASIX  Take 1 tablet (40 mg total) by mouth daily. X 10 days     Garlic 500 MG Caps  Take 500 mg by mouth daily.     Ginkgo Biloba 60 MG Caps  Take 1 capsule by mouth daily.     isosorbide mononitrate 15 mg Tb24  Commonly known as:  IMDUR  Take 0.5 tablets (15 mg total) by mouth daily.     levothyroxine 50 MCG tablet  Commonly known as:  SYNTHROID, LEVOTHROID  Take 50 mcg by mouth daily.     metoprolol 50 MG tablet  Commonly known as:  LOPRESSOR  Take 1 tablet (50 mg total) by mouth 2 (two) times daily.     nitroGLYCERIN 0.4 MG SL tablet  Commonly known as:  NITROSTAT  Place 1 tablet (0.4 mg total) under the tongue every 5 (five) minutes as needed for chest pain.     oxyCODONE 5 MG immediate release tablet  Commonly known as:  Oxy IR/ROXICODONE  Take 1-2 tablets (5-10 mg total) by mouth every 3 (three) hours as needed for pain.     potassium chloride SA 20 MEQ tablet  Commonly known as:  K-DUR,KLOR-CON  Take 1 tablet (20 mEq total) by mouth daily. X 10 days      simvastatin 40 MG tablet  Commonly known as:  ZOCOR  Take 40 mg by mouth at bedtime.     tamoxifen 10 MG tablet  Commonly known as:  NOLVADEX  Take 10 mg by mouth 2 (two) times daily.     warfarin 5 MG tablet  Commonly known as:  COUMADIN  Take  5 mg by mouth See admin instructions. Day 1: 4 mg, Day 2: 4 mg, Day 3: 5 mg, Day 4: 4 mg, Day 5: 4 mg, Day 6: 5 mg, Day 7: 4 mg.     warfarin 2 MG tablet  Commonly known as:  COUMADIN  Take 2 mg by mouth See admin instructions. Day 1: 4 mg, Day 2: 4 mg, Day 3: 5 mg, Day 4: 4 mg Day 5: 4 mg Day 6: 5 mg Day 7: 4 mg         Discharge Instructions:  The patient is to refrain from driving, heavy lifting or strenuous activity.  May shower daily and clean incisions with soap and water.  May resume regular diet.   Follow Up: Follow-up Information   Follow up with VAN Dinah Beers, MD On 05/07/2013. (Have a chest x-ray at [facility] at 12:30, then see MD at 1:30)    Contact information:   [address] [place] [address] [phone]       Follow up with Marykay Lex, MD. Schedule an appointment as soon as possible for a visit in 2 weeks.   Contact information:   [address] [place] [address] [phone]       Please follow up. (Have blood work for Coumadin (PT/INR) within 48 hours of discharge)       Follow up with [facility] NURSE On 04/23/2013. (Dr. Zenaida Niece Trigt's nurse will remove staples at 10:00)       Chastity Noland H 04/16/2013, 8:26 AM

## 2013-04-17 ENCOUNTER — Other Ambulatory Visit: Payer: Self-pay | Admitting: *Deleted

## 2013-04-17 ENCOUNTER — Other Ambulatory Visit (HOSPITAL_COMMUNITY): Payer: Self-pay | Admitting: Oncology

## 2013-04-17 ENCOUNTER — Encounter (HOSPITAL_COMMUNITY): Payer: BC Managed Care – PPO | Attending: Internal Medicine

## 2013-04-17 DIAGNOSIS — I6529 Occlusion and stenosis of unspecified carotid artery: Secondary | ICD-10-CM | POA: Insufficient documentation

## 2013-04-17 LAB — BASIC METABOLIC PANEL
BUN: 39 mg/dL — ABNORMAL HIGH (ref 6–23)
GFR calc Af Amer: 56 mL/min — ABNORMAL LOW (ref >90)
GFR calc non Af Amer: 49 mL/min — ABNORMAL LOW (ref >90)
Potassium: 3.6 mEq/L (ref 3.5–5.1)

## 2013-04-17 LAB — CBC
Hemoglobin: 9.5 g/dL — ABNORMAL LOW (ref 13.0–17.0)
MCHC: 32.6 g/dL (ref 30.0–36.0)
Platelets: 497 10*3/uL — ABNORMAL HIGH (ref 150–400)
RDW: 13.9 % (ref 11.5–15.5)

## 2013-04-17 LAB — PROTIME-INR: Prothrombin Time: 19.2 seconds — ABNORMAL HIGH (ref 11.6–15.2)

## 2013-04-17 NOTE — Progress Notes (Signed)
Labs drawn today for pt, cbc,bmp

## 2013-04-21 ENCOUNTER — Other Ambulatory Visit: Payer: Self-pay | Admitting: *Deleted

## 2013-04-21 DIAGNOSIS — I2510 Atherosclerotic heart disease of native coronary artery without angina pectoris: Secondary | ICD-10-CM

## 2013-04-22 ENCOUNTER — Other Ambulatory Visit (HOSPITAL_COMMUNITY): Payer: Self-pay | Admitting: Oncology

## 2013-04-22 ENCOUNTER — Encounter (HOSPITAL_BASED_OUTPATIENT_CLINIC_OR_DEPARTMENT_OTHER): Payer: BC Managed Care – PPO

## 2013-04-22 DIAGNOSIS — I6529 Occlusion and stenosis of unspecified carotid artery: Secondary | ICD-10-CM

## 2013-04-22 LAB — PROTIME-INR
INR: 1.72 — ABNORMAL HIGH (ref 0.00–1.49)
Prothrombin Time: 19.7 seconds — ABNORMAL HIGH (ref 11.6–15.2)

## 2013-04-22 NOTE — Progress Notes (Signed)
Ryan [name]'s reason for visit today are for labs as scheduled per MD orders.  Venipuncture performed with a 23 gauge butterfly needle to R Antecubital.  Ryan Filbert Rosiles tolerated venipuncture well and without incident; questions were answered and patient was discharged.    Taking coumadin 4mg  daily as of 04/17/13.

## 2013-04-23 ENCOUNTER — Ambulatory Visit (INDEPENDENT_AMBULATORY_CARE_PROVIDER_SITE_OTHER): Payer: Self-pay | Admitting: *Deleted

## 2013-04-23 DIAGNOSIS — I2510 Atherosclerotic heart disease of native coronary artery without angina pectoris: Secondary | ICD-10-CM

## 2013-04-23 DIAGNOSIS — Z951 Presence of aortocoronary bypass graft: Secondary | ICD-10-CM

## 2013-04-23 DIAGNOSIS — Z4802 Encounter for removal of sutures: Secondary | ICD-10-CM

## 2013-04-23 DIAGNOSIS — D381 Neoplasm of uncertain behavior of trachea, bronchus and lung: Secondary | ICD-10-CM

## 2013-04-23 NOTE — Progress Notes (Signed)
Ryan [name] returns for removal of the staples from his left radial harvest site, s/p CABG x 3.  The area is reddened along the staple line showing staple irritation.  The long incision is otherwise well healed without signs of infection.  His sternal incision, two previous chest tube sites and his right leg endo vein harvest incisions are well healed also.  He c/o some shortness of breath when he first got home , but says this is gradually improving everyday.  He does not have any LE edema.  His appetite and bowels are good. Ling sounds are clear.  He sees his cardiologist next week and returns here the following week with a cxr.

## 2013-04-28 ENCOUNTER — Other Ambulatory Visit (HOSPITAL_COMMUNITY): Payer: Self-pay | Admitting: Oncology

## 2013-04-28 ENCOUNTER — Encounter (HOSPITAL_BASED_OUTPATIENT_CLINIC_OR_DEPARTMENT_OTHER): Payer: BC Managed Care – PPO

## 2013-04-28 DIAGNOSIS — I6529 Occlusion and stenosis of unspecified carotid artery: Secondary | ICD-10-CM

## 2013-04-30 ENCOUNTER — Ambulatory Visit (INDEPENDENT_AMBULATORY_CARE_PROVIDER_SITE_OTHER): Payer: BC Managed Care – PPO | Admitting: Cardiology

## 2013-04-30 VITALS — BP 116/60 | HR 68 | Ht 75.0 in | Wt 247.2 lb

## 2013-04-30 DIAGNOSIS — I739 Peripheral vascular disease, unspecified: Secondary | ICD-10-CM

## 2013-04-30 DIAGNOSIS — E7800 Pure hypercholesterolemia, unspecified: Secondary | ICD-10-CM

## 2013-04-30 DIAGNOSIS — I2510 Atherosclerotic heart disease of native coronary artery without angina pectoris: Secondary | ICD-10-CM

## 2013-04-30 DIAGNOSIS — Z951 Presence of aortocoronary bypass graft: Secondary | ICD-10-CM

## 2013-04-30 DIAGNOSIS — Z7901 Long term (current) use of anticoagulants: Secondary | ICD-10-CM

## 2013-04-30 DIAGNOSIS — E669 Obesity, unspecified: Secondary | ICD-10-CM

## 2013-04-30 MED ORDER — METOPROLOL TARTRATE 50 MG PO TABS: 50.0000 mg | ORAL_TABLET | Freq: Two times a day (BID) | ORAL | Status: DC

## 2013-04-30 MED ORDER — AMLODIPINE BESYLATE 10 MG PO TABS: 10.0000 mg | ORAL_TABLET | Freq: Every day | ORAL | Status: DC

## 2013-04-30 NOTE — Patient Instructions (Addendum)
Your physician wants you to follow-up in 3 month  With Dr Herbie Baltimore.  You will receive a reminder letter in the mail two months in advance. If you don't receive a letter, please call our office to schedule the follow-up appointment.

## 2013-05-02 ENCOUNTER — Encounter (HOSPITAL_BASED_OUTPATIENT_CLINIC_OR_DEPARTMENT_OTHER): Payer: BC Managed Care – PPO

## 2013-05-02 DIAGNOSIS — I6529 Occlusion and stenosis of unspecified carotid artery: Secondary | ICD-10-CM

## 2013-05-02 LAB — PROTIME-INR: Prothrombin Time: 26.8 seconds — ABNORMAL HIGH (ref 11.6–15.2)

## 2013-05-02 NOTE — Progress Notes (Signed)
Labs drawn today for pt 

## 2013-05-05 ENCOUNTER — Encounter: Payer: Self-pay | Admitting: Cardiology

## 2013-05-05 DIAGNOSIS — E669 Obesity, unspecified: Secondary | ICD-10-CM | POA: Insufficient documentation

## 2013-05-05 DIAGNOSIS — I2510 Atherosclerotic heart disease of native coronary artery without angina pectoris: Secondary | ICD-10-CM | POA: Insufficient documentation

## 2013-05-05 NOTE — Assessment & Plan Note (Signed)
Hopefully with being able to get back walking, he'll lose some weight.  His also adjust his dietary intake.  He will benefit from cardiac rehabilitation would be associated dietary counseling.

## 2013-05-05 NOTE — Assessment & Plan Note (Addendum)
Very st with no active symptoms.able  He is now back on aspirin, beta blocker, and statin.  He is not on an ACE inhibitor as yet, mostly because of blood pressure concerns.

## 2013-05-05 NOTE — Assessment & Plan Note (Addendum)
We shall see how he does when he gets in a cardiac rehabilitation.  I like to see if his symptoms will be reproducible in the absence of coronary ischemia. If his symptoms are noted in the future, we will order Lower Extremity Arterial Dopplers with ABIs.

## 2013-05-05 NOTE — Assessment & Plan Note (Signed)
On simvastatin, monitored by primary physician.  Goals LDL is now <70 with CAD, PVD, and carotid disease.

## 2013-05-05 NOTE — Assessment & Plan Note (Signed)
He's gradually getting back to his pre-CABG activities.  Awaiting clearance from cardiac surgery, prior to starting cardiac rehabilitation.

## 2013-05-05 NOTE — Assessment & Plan Note (Signed)
No signs of bleeding.  Followed by Magdalen Spatz M.D..

## 2013-05-05 NOTE — Progress Notes (Signed)
Patient ID: Ryan [name], male   DOB: Jun 04, 1951, 62 y.o.   MRN: [id number] PCP:  [name], MD   Chief Complaint  Patient presents with  . Follow-up    post CABG, having problem sleeping    Clinic Note: HPI: Ryan [name] is a 62 y.o. male  who I first saw in urgent consultation by phone 25th of June for symptoms that were very concerning for crescendo unstable angina.  He has a history of known carotid artery occlusion and is having worsening shortness of breath and chest pressure with exertion over the course of months until the point where he was only walking across her room because symptoms.  My recommendation was to go directly to cardiac catheterization, which revealed that following Monday.  Angiography revealed severe left main disease, as well as RCA disease.  He was referred for and underwent urgent CABGx3 by Dr. Kathlee Nations Trigt the following day as described below. Postoperatively, he was having some problems with breathing and is on oxygen.  He says his breathing is much better now.  His not noting PAB, orthopnea.  He is actually walking without significant dyspnea.  He has an extensive history of Hodgkin's disease back in 1983 treated with mantle radiation, and recently diagnosed invasive ductal carcinoma diagnosed 2 years ago, treated with right radical mastectomy mastectomy and chemotherapy. He also had a TIA/CVA back in 2007 which time his have an occluded right internal carotid artery with moderate disease in left internal carotid artery. He is currently on tamoxifen for his breast cancer, so was switched from Aggrenox to warfarin for stroke prevention. He has dyslipidemia with most recent cholesterol panel showing total shoulder a 9x24 HDL 38 LDL 112 on a statin. He is referred by Dr. Assunta Found from [facility] for urgent evaluation.  Interval History: Today, presents his first post hospital visit.  He's still getting over lower the soreness in  his chest for the breast.  Discomfort is definitely getting worse.  He is now back to walking daily and actually is feeling dramatically better than he was.  His very happy about how well.  He is feeling.  He is not having any of his chest discomfort or shortness of breath at all with exertion or at rest.  Besides musculoskeletal symptoms from his surgery, he denies any other significant symptoms.  He has not had any palpitations, or lightheadedness, dizziness, wooziness, syncope/near-syncope.  No PND, orthopnea, or any worsening edema.  Other pertinent cardiac review of systems includes no melena, hematochezia or hematuria.  On his initial visit.  He did note having some symptoms of claudication with effort, but was not able to note, the significance due to his anginal symptoms with exertion.  He is yet to get up walk around and off now to tell me that is bothering him.  We'll reassess this in the future.  None.  He does note that he has problems with sleeping since his surgery.  His been using Xanax for that and it seems to work okay, but his his concern about for long-term.  He does say that is getting better each day, since leaving the hospital.  Past Medical History  Diagnosis Date  . Invasive ductal carcinoma of breast, stage 2 04/27/2011    chemo, mastectomy  . Right carotid artery occlusion 04/27/2011  . Left carotid artery partial occlusion 04/27/2011  . Stroke     2007  . Hypercholesterolemia   . Adie's pupil   .  H/O Hodgkin's disease 69  . Thyroid disease   . S/P CABG x 3  left internal mammary artery to left anterior descending, left radial artery free graft to ramus intermediate, saphenous vein graft to right coronary artery).  04/09/2013  . CAD (coronary artery disease), native coronary artery 04/09/2013    Distal left main stenosis 70-80% (at trifurcation into LAD, Circumflex, and 2.  Ramus branches, 70% mid RCA    Prior Cardiac Evaluation and Past Surgical History: Past Surgical  History  Procedure Laterality Date  . Mastectomy  rt  . Lymph node biopsy      left neck  . Coronary artery bypass graft [name] 04/08/2013    Procedure: CORONARY ARTERY BYPASS GRAFTING (CABG);  Surgeon: Kerin Perna, MD;  Location: [facility] OR;  Service: Open Heart Surgery;  Laterality: [name];  Times 3 using left internal mammary artery; endoscopically harvested right saphenous vein and left radial artery  . Intraoperative transesophageal echocardiogram [name] 04/08/2013    Procedure: INTRAOPERATIVE TRANSESOPHAGEAL ECHOCARDIOGRAM;  Surgeon: Kerin Perna, MD;  Location: [facility] OR;  Service: Open Heart Surgery;  Laterality: [name];  . Radial artery harvest Left 04/08/2013    Procedure: RADIAL ARTERY HARVEST;  Surgeon: Kerin Perna, MD;  Location: [facility] OR;  Service: Open Heart Surgery;  Laterality: Left;    Allergies  Allergen Reactions  . Penicillins     Childhood Reaction    Current Outpatient Prescriptions  Medication Sig Dispense Refill  . ALPRAZolam (XANAX) 1 MG tablet Take 1 mg by mouth at bedtime as needed for anxiety. Take 0.5 mg by mouth at bedtime as needed for anxiety      . amLODipine (NORVASC) 10 MG tablet Take 1 tablet (10 mg total) by mouth daily.  90 tablet  3  . aspirin EC 81 MG tablet Take 162 mg by mouth daily.       . citalopram (CELEXA) 40 MG tablet Take 40 mg by mouth daily.       . Coconut Oil 1000 MG CAPS Take 1,000 mg by mouth daily.      . Garlic 500 MG CAPS Take 500 mg by mouth daily.      . Ginkgo Biloba 60 MG CAPS Take 1 capsule by mouth daily.      . isosorbide mononitrate (IMDUR) 15 mg TB24 Take 0.5 tablets (15 mg total) by mouth daily.  30 tablet  0  . levothyroxine (SYNTHROID, LEVOTHROID) 50 MCG tablet Take 50 mcg by mouth daily.      . nitroGLYCERIN (NITROSTAT) 0.4 MG SL tablet Place 1 tablet (0.4 mg total) under the tongue every 5 (five) minutes as needed for chest pain.  25 tablet  3  . Omega-3 Fatty Acids (FISH OIL) 1000 MG CPDR Take 1 each by mouth daily.      Marland Kitchen  oxyCODONE (OXY IR/ROXICODONE) 5 MG immediate release tablet Take 1-2 tablets (5-10 mg total) by mouth every 3 (three) hours as needed for pain.  30 tablet  0  . potassium chloride SA (K-DUR,KLOR-CON) 20 MEQ tablet Take 1 tablet (20 mEq total) by mouth daily. X 10 days  10 tablet  0  . simvastatin (ZOCOR) 40 MG tablet Take 40 mg by mouth at bedtime.       . tamoxifen (NOLVADEX) 10 MG tablet Take 10 mg by mouth 2 (two) times daily.      Marland Kitchen warfarin (COUMADIN) 2 MG tablet Take 2 mg by mouth See admin instructions. Day 1: 4 mg, Day 2: 4  mg, Day 3: 5 mg, Day 4: 4 mg Day 5: 4 mg Day 6: 5 mg Day 7: 4 mg      . warfarin (COUMADIN) 5 MG tablet Take 5 mg by mouth See admin instructions. Day 1: 4 mg, Day 2: 4 mg, Day 3: 5 mg, Day 4: 4 mg, Day 5: 4 mg, Day 6: 5 mg, Day 7: 4 mg.      . metoprolol (LOPRESSOR) 50 MG tablet Take 1 tablet (50 mg total) by mouth 2 (two) times daily.  180 tablet  3   No current facility-administered medications for this visit.    History   Social History  . Marital Status: Married    Spouse Name: [name]    Number of Children: [name]  . Years of Education: [name]   Occupational History  . Not on file.   Social History Main Topics  . Smoking status: Former Smoker    Types: Cigarettes    Quit date: 10/09/1981  . Smokeless tobacco: Never Used  . Alcohol Use: No  . Drug Use: No  . Sexually Active:    Other Topics Concern  . Not on file   Social History Narrative  . No narrative on file   Family History  Problem Relation Age of Onset  . Alzheimer's disease Mother   . Stroke Mother   . Hypertension Mother   . Hypertension Father   . Hyperlipidemia Mother   . Diabetes Mother   . Diabetes Paternal Grandmother   . Diabetes Paternal Grandfather     ROS: A comprehensive Review of Systems - Negative except Pertinent positives as above, as well as minor positives below. General ROS: positive for  - fatigue and malaise that is improving, but now noting difficulty sleeping  and negative for - chills, fever, hot flashes, night sweats Gastrointestinal ROS: no abdominal pain, change in bowel habits, or black or bloody stools Genito-Urinary ROS: no dysuria, trouble voiding, or hematuria Musculoskeletal ROS: positive for - Mild arthritic pains in his knees hands and feet/ankles along with his chest wall pain.  Postoperatively.   PHYSICAL EXAM BP 116/60  Pulse 68  Ht 6\' 3"  (1.905 m)  Wt 247 lb 3.2 oz (112.129 kg)  BMI 30.9 kg/m2 General appearance: alert, cooperative, appears stated age, no distress and mildly obese - mostly truncal; otherwise relatively healthy appearing. Answers questions appropriately. Well-nourished and well-groomed Neck: no adenopathy, no carotid bruit and no JVD Lungs: clear to auscultation bilaterally, normal percussion bilaterally and non-labored Heart: regular rate and rhythm, S1, S2 normal, no murmur, click, rub or gallop; nondisplaced PMI; well healing scars from his CABG. Abdomen: soft, non-tender; bowel sounds normal; no masses,  no organomegaly; no HJR Extremities: extremities normal, atraumatic, no cyanosis, and edema ; no significant venous stasis changes or rashes.  Wrist site for cath stable. Pulses: 2+ and symmetric; palpable throughout Neurologic: Mental status: Alert, oriented, thought content appropriate Cranial nerves: normal (II-XII grossly intact)   [id number] today: No  ASSESSMENT / PLAN: 62 year old gentleman, who is about 3 weeks postop from CABG x3.  Doing quite well.  His blood pressure and heart rate well-controlled.  He is back on the warfarin for his stroke prevention.   CAD in native artery - status post CABG x3 (LIMA-LAD, Left Radial-Ramus Intermedius, SVG-RCA  Very st with no active symptoms.able  He is now back on aspirin, beta blocker, and statin.  He is not on an ACE inhibitor as yet, mostly because of blood pressure concerns.  S/P CABG x 3  LIMA-LAD, free LRA to RI, SVG-RCA  - 04/09/13 He's gradually  getting back to his pre-CABG activities.  Awaiting clearance from cardiac surgery, prior to starting cardiac rehabilitation.   Hypercholesterolemia On simvastatin, monitored by primary physician.  Goals LDL is now <70 with CAD, PVD, and carotid disease.  Claudication of calf muscles We shall see how he does when he gets in a cardiac rehabilitation.  I like to see if his symptoms will be reproducible in the absence of coronary ischemia. If his symptoms are noted in the future, we will order Lower Extremity Arterial Dopplers with ABIs.  Obesity (BMI 30-39.9) Hopefully with being able to get back walking, he'll lose some weight.  His also adjust his dietary intake.  He will benefit from cardiac rehabilitation would be associated dietary counseling.  Chronic anticoagulation- Coumadin  No signs of bleeding.  Followed by primay M.D..   Followup:  3 months  Lauri Till W, M.D., M.S. [facility] [address] [place], [place]  [address]  [phone] Pager # [phone] 05/05/2013 1:43 AM

## 2013-05-07 ENCOUNTER — Ambulatory Visit
Admission: RE | Admit: 2013-05-07 | Discharge: 2013-05-07 | Disposition: A | Discharge status: Home / Self Care | Payer: BC Managed Care – PPO | Source: Ambulatory Visit | Attending: Cardiothoracic Surgery | Admitting: Cardiothoracic Surgery

## 2013-05-07 ENCOUNTER — Encounter: Payer: Self-pay | Admitting: Cardiothoracic Surgery

## 2013-05-07 ENCOUNTER — Ambulatory Visit (INDEPENDENT_AMBULATORY_CARE_PROVIDER_SITE_OTHER): Payer: Self-pay | Admitting: Cardiothoracic Surgery

## 2013-05-07 VITALS — BP 108/71 | HR 61 | Resp 18 | Ht 75.0 in | Wt 247.0 lb

## 2013-05-07 DIAGNOSIS — Z951 Presence of aortocoronary bypass graft: Secondary | ICD-10-CM

## 2013-05-07 DIAGNOSIS — I2510 Atherosclerotic heart disease of native coronary artery without angina pectoris: Secondary | ICD-10-CM

## 2013-05-08 NOTE — Progress Notes (Signed)
PCP is [name], MD Referring Provider is Marykay Lex, MD  Chief Complaint  Patient presents with  . Routine Post Op    f/u from surgery with CXR, S/P CABG x 3 on 04/08/13    HPI: Routine followup after multivessel CABG. Patient progressing with strength, ambulation, with decreasing pain. Ready to start outpatient cardiac rehabilitation. All incisions healing well. No bleeding problems from Coumadin which he takes for carotid occlusion  Past Medical History  Diagnosis Date  . Invasive ductal carcinoma of breast, stage 2 04/27/2011    chemo, mastectomy  . Right carotid artery occlusion 04/27/2011  . Left carotid artery partial occlusion 04/27/2011  . Stroke     2007  . Hypercholesterolemia   . Adie's pupil   . H/O Hodgkin's disease 70  . Thyroid disease   . S/P CABG x 3  left internal mammary artery to left anterior descending, left radial artery free graft to ramus intermediate, saphenous vein graft to right coronary artery).  04/09/2013  . CAD (coronary artery disease), native coronary artery 04/09/2013    Distal left main stenosis 70-80% (at trifurcation into LAD, Circumflex, and 2.  Ramus branches, 70% mid RCA    Past Surgical History  Procedure Laterality Date  . Mastectomy  rt  . Lymph node biopsy      left neck  . Coronary artery bypass graft [name] 04/08/2013    Procedure: CORONARY ARTERY BYPASS GRAFTING (CABG);  Surgeon: Kerin Perna, MD;  Location: [facility] OR;  Service: Open Heart Surgery;  Laterality: [name];  Times 3 using left internal mammary artery; endoscopically harvested right saphenous vein and left radial artery  . Intraoperative transesophageal echocardiogram [name] 04/08/2013    Procedure: INTRAOPERATIVE TRANSESOPHAGEAL ECHOCARDIOGRAM;  Surgeon: Kerin Perna, MD;  Location: [facility] OR;  Service: Open Heart Surgery;  Laterality: [name];  . Radial artery harvest Left 04/08/2013    Procedure: RADIAL ARTERY HARVEST;  Surgeon: Kerin Perna, MD;  Location: [facility] OR;  Service:  Open Heart Surgery;  Laterality: Left;    Family History  Problem Relation Age of Onset  . Alzheimer's disease Mother   . Stroke Mother   . Hypertension Mother   . Hypertension Father   . Hyperlipidemia Mother   . Diabetes Mother   . Diabetes Paternal Grandmother   . Diabetes Paternal Grandfather     Social History History  Substance Use Topics  . Smoking status: Former Smoker    Types: Cigarettes    Quit date: 10/09/1981  . Smokeless tobacco: Never Used  . Alcohol Use: No    Current Outpatient Prescriptions  Medication Sig Dispense Refill  . ALPRAZolam (XANAX) 1 MG tablet Take 1 mg by mouth at bedtime as needed for anxiety. Take 0.5 mg by mouth at bedtime as needed for anxiety      . amLODipine (NORVASC) 10 MG tablet Take 1 tablet (10 mg total) by mouth daily.  90 tablet  3  . aspirin EC 81 MG tablet Take 162 mg by mouth daily.       . citalopram (CELEXA) 40 MG tablet Take 40 mg by mouth daily.       . Coconut Oil 1000 MG CAPS Take 1,000 mg by mouth daily.      . Garlic 500 MG CAPS Take 500 mg by mouth daily.      . Ginkgo Biloba 60 MG CAPS Take 1 capsule by mouth daily.      . isosorbide mononitrate (IMDUR) 15 mg TB24 Take  0.5 tablets (15 mg total) by mouth daily.  30 tablet  0  . levothyroxine (SYNTHROID, LEVOTHROID) 50 MCG tablet Take 50 mcg by mouth daily.      . metoprolol (LOPRESSOR) 50 MG tablet Take 1 tablet (50 mg total) by mouth 2 (two) times daily.  180 tablet  3  . nitroGLYCERIN (NITROSTAT) 0.4 MG SL tablet Place 1 tablet (0.4 mg total) under the tongue every 5 (five) minutes as needed for chest pain.  25 tablet  3  . Omega-3 Fatty Acids (FISH OIL) 1000 MG CPDR Take 1 each by mouth daily.      Marland Kitchen oxyCODONE (OXY IR/ROXICODONE) 5 MG immediate release tablet Take 1-2 tablets (5-10 mg total) by mouth every 3 (three) hours as needed for pain.  30 tablet  0  . simvastatin (ZOCOR) 40 MG tablet Take 40 mg by mouth at bedtime.       . tamoxifen (NOLVADEX) 10 MG tablet Take  10 mg by mouth 2 (two) times daily.      Marland Kitchen warfarin (COUMADIN) 5 MG tablet Take 5 mg by mouth See admin instructions. Day 1: 4 mg, Day 2: 4 mg, Day 3: 5 mg, Day 4: 4 mg, Day 5: 4 mg, Day 6: 5 mg, Day 7: 4 mg.       No current facility-administered medications for this visit.    Allergies  Allergen Reactions  . Penicillins     Childhood Reaction    Review of Systems no complaints  BP 108/71  Pulse 61  Resp 18  Ht 6\' 3"  (1.905 m)  Wt 247 lb (112.038 kg)  BMI 30.87 kg/m2  SpO2 98% Physical Exam Alert and oriented Sternal incision well-healed Heart rate regular Lungs clear Legs without edema  Diagnostic Tests:  Chest x-ray with minimal atelectasis no effusion  Impression: Doing well 3 weeks postop We'll start driving ready to start outpatient rehabilitation May lift up to 20 pounds until 3 months after surgery Plan: Return as scheduled           Lopressor reduced to 25 mg twice a day, imdur discontinued, aspirin reduced 81 mg a day

## 2013-05-12 ENCOUNTER — Encounter (HOSPITAL_COMMUNITY): Payer: BC Managed Care – PPO

## 2013-05-12 ENCOUNTER — Other Ambulatory Visit (HOSPITAL_COMMUNITY): Payer: Self-pay | Admitting: *Deleted

## 2013-05-12 DIAGNOSIS — I6529 Occlusion and stenosis of unspecified carotid artery: Secondary | ICD-10-CM | POA: Insufficient documentation

## 2013-05-12 NOTE — Progress Notes (Signed)
Patient notified to continue same dose coumadin. Pt level in 3 weeks.

## 2013-05-12 NOTE — Progress Notes (Signed)
Labs drawn today for pt 

## 2013-05-14 ENCOUNTER — Encounter (HOSPITAL_COMMUNITY)
Admission: RE | Admit: 2013-05-14 | Discharge: 2013-05-14 | Disposition: A | Discharge status: Home / Self Care | Payer: BC Managed Care – PPO | Source: Ambulatory Visit | Attending: Internal Medicine | Admitting: Internal Medicine

## 2013-05-14 ENCOUNTER — Encounter (HOSPITAL_COMMUNITY): Payer: Self-pay

## 2013-05-14 VITALS — BP 98/62 | HR 63 | Ht 75.0 in | Wt 237.7 lb

## 2013-05-14 DIAGNOSIS — I25810 Atherosclerosis of coronary artery bypass graft(s) without angina pectoris: Secondary | ICD-10-CM

## 2013-05-14 NOTE — Progress Notes (Addendum)
Patient was referred to Cardiac Rehab by Dr. Bryan Lemma due to CABGx3 414.04. During orientation advised patient on arrival and appointment times what to wear, what to do before, during and after exercise. Reviewed attendance and class policy. Talked about inclement weather and class consultation policy. Pt is scheduled to start Cardiac Rehab on 05/19/13 at 11 am. Pt was advised to come to class 5 minutes before class starts. He was also given instructions on meeting with the dietician and attending the Family Structure classes. Pt is eager to get started. Patient was able to complete the six minute walk test.

## 2013-05-14 NOTE — Patient Instructions (Signed)
Pt has finished orientation and is scheduled to start CR on 05/19/13 at 11 am. Pt has been instructed to arrive to class 15 minutes early for scheduled class. Pt has been instructed to wear comfortable clothing and shoes with rubber soles. Pt has been told to take their medications 1 hour prior to coming to class.  If the patient is not going to attend class, he/she has been instructed to call.

## 2013-05-19 ENCOUNTER — Encounter (HOSPITAL_COMMUNITY)
Admission: RE | Admit: 2013-05-19 | Discharge: 2013-05-19 | Disposition: A | Discharge status: Home / Self Care | Payer: BC Managed Care – PPO | Source: Ambulatory Visit | Attending: Cardiology | Admitting: Cardiology

## 2013-05-21 ENCOUNTER — Encounter (HOSPITAL_COMMUNITY)
Admission: RE | Admit: 2013-05-21 | Discharge: 2013-05-21 | Disposition: A | Discharge status: Home / Self Care | Payer: BC Managed Care – PPO | Source: Ambulatory Visit | Attending: Cardiology | Admitting: Cardiology

## 2013-05-23 ENCOUNTER — Encounter (HOSPITAL_COMMUNITY)
Admission: RE | Admit: 2013-05-23 | Discharge: 2013-05-23 | Disposition: A | Discharge status: Home / Self Care | Payer: BC Managed Care – PPO | Source: Ambulatory Visit | Attending: Cardiology | Admitting: Cardiology

## 2013-05-26 ENCOUNTER — Encounter (HOSPITAL_COMMUNITY)
Admission: RE | Admit: 2013-05-26 | Discharge: 2013-05-26 | Disposition: A | Discharge status: Home / Self Care | Payer: BC Managed Care – PPO | Source: Ambulatory Visit | Attending: Cardiology | Admitting: Cardiology

## 2013-05-28 ENCOUNTER — Encounter (HOSPITAL_COMMUNITY)
Admission: RE | Admit: 2013-05-28 | Discharge: 2013-05-28 | Disposition: A | Discharge status: Home / Self Care | Payer: BC Managed Care – PPO | Source: Ambulatory Visit | Attending: Cardiology | Admitting: Cardiology

## 2013-05-30 ENCOUNTER — Encounter (HOSPITAL_COMMUNITY)
Admission: RE | Admit: 2013-05-30 | Discharge: 2013-05-30 | Disposition: A | Discharge status: Home / Self Care | Payer: BC Managed Care – PPO | Source: Ambulatory Visit | Attending: Cardiology | Admitting: Cardiology

## 2013-06-02 ENCOUNTER — Encounter (HOSPITAL_COMMUNITY): Payer: BC Managed Care – PPO

## 2013-06-02 ENCOUNTER — Encounter (HOSPITAL_COMMUNITY)
Admission: RE | Admit: 2013-06-02 | Discharge: 2013-06-02 | Disposition: A | Discharge status: Home / Self Care | Payer: BC Managed Care – PPO | Source: Ambulatory Visit | Attending: Cardiology | Admitting: Cardiology

## 2013-06-02 ENCOUNTER — Other Ambulatory Visit: Payer: Self-pay | Admitting: *Deleted

## 2013-06-02 DIAGNOSIS — I2510 Atherosclerotic heart disease of native coronary artery without angina pectoris: Secondary | ICD-10-CM

## 2013-06-02 LAB — PROTIME-INR
INR: 2.64 — ABNORMAL HIGH (ref 0.00–1.49)
Prothrombin Time: 27.3 seconds — ABNORMAL HIGH (ref 11.6–15.2)

## 2013-06-02 NOTE — Progress Notes (Unsigned)
Ryan [name] presented for labwork. Labs per MD order drawn via Peripheral Line 23 gauge needle inserted in RT AC Good blood return present. Procedure without incident.  Needle removed intact. Patient tolerated procedure well.  Coumadin 4mg  daily/documented on flowsheet.

## 2013-06-04 ENCOUNTER — Ambulatory Visit: Payer: BC Managed Care – PPO | Admitting: Cardiothoracic Surgery

## 2013-06-04 ENCOUNTER — Encounter (HOSPITAL_COMMUNITY)
Admission: RE | Admit: 2013-06-04 | Discharge: 2013-06-04 | Disposition: A | Discharge status: Home / Self Care | Payer: BC Managed Care – PPO | Source: Ambulatory Visit | Attending: Cardiology | Admitting: Cardiology

## 2013-06-05 ENCOUNTER — Encounter: Payer: Self-pay | Admitting: Cardiothoracic Surgery

## 2013-06-05 ENCOUNTER — Ambulatory Visit (INDEPENDENT_AMBULATORY_CARE_PROVIDER_SITE_OTHER): Payer: Self-pay | Admitting: Cardiothoracic Surgery

## 2013-06-05 VITALS — BP 90/63 | HR 74 | Resp 16 | Ht 75.0 in

## 2013-06-05 DIAGNOSIS — I2510 Atherosclerotic heart disease of native coronary artery without angina pectoris: Secondary | ICD-10-CM

## 2013-06-05 DIAGNOSIS — Z951 Presence of aortocoronary bypass graft: Secondary | ICD-10-CM

## 2013-06-05 NOTE — Progress Notes (Signed)
PCP is [name], MD Referring Provider is Marykay Lex, MD  Chief Complaint  Patient presents with  . Routine Post Op    4 wk f/u CABG with cxr...STILL FEELING VERY DIZZY AT TIMES AND WEAK    HPI: Final visit almost 2 months after multivessel CABG. Patient is progressing in improving at outpatient cardiac rehabilitation. No recurrent angina. His exercise tolerance is gradually improving. He has had some orthostatic symptoms associated with probable hypertension. He needed antihypertensive medications after being discharged following surgery but he has lost weight and currently his blood pressure medicines are being reduced or eliminated--beta blocker reduced from 50 mg Lopressor twice a day to 12.5 mg twice a day and 10 mg daily Norvasc has been discontinued. The patient has lost approximately 15 pounds in weight over the past 5 weeks. He is intentionally eliminated most sodium from his diet. His blood pressures checked at least 3-4 times at each session of cardiac rehabilitation and he states it runs between 95 and 100 systolic the surgical incisions are healing well he has no significant pain requiring pain meds   Past Medical History  Diagnosis Date  . Invasive ductal carcinoma of breast, stage 2 04/27/2011    chemo, mastectomy  . Right carotid artery occlusion 04/27/2011  . Left carotid artery partial occlusion 04/27/2011  . Stroke     2007  . Hypercholesterolemia   . Adie's pupil   . H/O Hodgkin's disease 32  . Thyroid disease   . S/P CABG x 3  left internal mammary artery to left anterior descending, left radial artery free graft to ramus intermediate, saphenous vein graft to right coronary artery).  04/09/2013  . CAD (coronary artery disease), native coronary artery 04/09/2013    Distal left main stenosis 70-80% (at trifurcation into LAD, Circumflex, and 2.  Ramus branches, 70% mid RCA    Past Surgical History  Procedure Laterality Date  . Mastectomy  rt  . Lymph node  biopsy      left neck  . Coronary artery bypass graft [name] 04/08/2013    Procedure: CORONARY ARTERY BYPASS GRAFTING (CABG);  Surgeon: Kerin Perna, MD;  Location: [facility] OR;  Service: Open Heart Surgery;  Laterality: [name];  Times 3 using left internal mammary artery; endoscopically harvested right saphenous vein and left radial artery  . Intraoperative transesophageal echocardiogram [name] 04/08/2013    Procedure: INTRAOPERATIVE TRANSESOPHAGEAL ECHOCARDIOGRAM;  Surgeon: Kerin Perna, MD;  Location: [facility] OR;  Service: Open Heart Surgery;  Laterality: [name];  . Radial artery harvest Left 04/08/2013    Procedure: RADIAL ARTERY HARVEST;  Surgeon: Kerin Perna, MD;  Location: [facility] OR;  Service: Open Heart Surgery;  Laterality: Left;    Family History  Problem Relation Age of Onset  . Alzheimer's disease Mother   . Stroke Mother   . Hypertension Mother   . Hypertension Father   . Hyperlipidemia Mother   . Diabetes Mother   . Diabetes Paternal Grandmother   . Diabetes Paternal Grandfather     Social History History  Substance Use Topics  . Smoking status: Former Smoker -- 1.00 packs/day    Types: Cigarettes    Quit date: 10/09/1981  . Smokeless tobacco: Never Used  . Alcohol Use: No    Current Outpatient Prescriptions  Medication Sig Dispense Refill  . ALPRAZolam (XANAX) 1 MG tablet Take 1 mg by mouth at bedtime as needed for anxiety. Take 0.5 mg by mouth at bedtime as needed for anxiety      .  amLODipine (NORVASC) 10 MG tablet Take 1 tablet (10 mg total) by mouth daily.  90 tablet  3  . aspirin EC 81 MG tablet Take 81 mg by mouth daily.       . citalopram (CELEXA) 40 MG tablet Take 40 mg by mouth daily.       . Coconut Oil 1000 MG CAPS Take 1,000 mg by mouth daily.      . Garlic 500 MG CAPS Take 500 mg by mouth daily.      . Ginkgo Biloba 60 MG CAPS Take 1 capsule by mouth daily.      Marland Kitchen levothyroxine (SYNTHROID, LEVOTHROID) 50 MCG tablet Take 50 mcg by mouth daily.      . metoprolol  (LOPRESSOR) 50 MG tablet Take 25 mg by mouth 2 (two) times daily.      . nitroGLYCERIN (NITROSTAT) 0.4 MG SL tablet Place 1 tablet (0.4 mg total) under the tongue every 5 (five) minutes as needed for chest pain.  25 tablet  3  . Omega-3 Fatty Acids (FISH OIL) 1000 MG CPDR Take 1 each by mouth daily.      . simvastatin (ZOCOR) 40 MG tablet Take 40 mg by mouth at bedtime.       . tamoxifen (NOLVADEX) 10 MG tablet Take 10 mg by mouth 2 (two) times daily.      Marland Kitchen warfarin (COUMADIN) 5 MG tablet Take 5 mg by mouth See admin instructions. Day 1: 4 mg, Day 2: 4 mg, Day 3: 5 mg, Day 4: 4 mg, Day 5: 4 mg, Day 6: 5 mg, Day 7: 4 mg. OR AS DIRECTED       No current facility-administered medications for this visit.    Allergies  Allergen Reactions  . Penicillins     Childhood Reaction    Review of Systems overall doing well and progressing but does have some dizziness and orthostatic symptoms probably related to blood pressure medicines and possibly some dehydration this summer  BP 90/63  Pulse 74  Resp 16  Ht 6\' 3"  (1.905 m)  SpO2 98% Physical Exam Alert and comfortable Lungs clear Sternum well-healed Heart rhythm regular no murmur   Diagnostic Tests: Last chest x-ray 3 weeks ago shows significant proven basilar atelectasis, no pleural effusion  Impression: Doing well almost 2 months after surgery. The patient return to care of his cardiologist and return ears needed. He knows he should not do heavy lifting or heavy yard work until 3 months after the time of surgery.  Plan: Return as needed Stop taking Norvasc and continue taking a long-term beta blocker until reevaluated by your cardiologist Dr. Herbie Baltimore

## 2013-06-06 ENCOUNTER — Encounter (HOSPITAL_COMMUNITY)
Admission: RE | Admit: 2013-06-06 | Discharge: 2013-06-06 | Disposition: A | Discharge status: Home / Self Care | Payer: BC Managed Care – PPO | Source: Ambulatory Visit | Attending: Cardiology | Admitting: Cardiology

## 2013-06-06 MED ORDER — FENTANYL CITRATE 0.05 MG/ML IJ SOLN
INTRAMUSCULAR | Status: AC
  Filled 2013-06-06: qty 2

## 2013-06-09 ENCOUNTER — Encounter (HOSPITAL_COMMUNITY): Payer: BC Managed Care – PPO

## 2013-06-11 ENCOUNTER — Encounter (HOSPITAL_COMMUNITY)
Admission: RE | Admit: 2013-06-11 | Discharge: 2013-06-11 | Disposition: A | Discharge status: Home / Self Care | Payer: BC Managed Care – PPO | Source: Ambulatory Visit | Attending: Internal Medicine | Admitting: Internal Medicine

## 2013-06-11 DIAGNOSIS — I6529 Occlusion and stenosis of unspecified carotid artery: Secondary | ICD-10-CM | POA: Insufficient documentation

## 2013-06-13 ENCOUNTER — Encounter (HOSPITAL_COMMUNITY)
Admission: RE | Admit: 2013-06-13 | Discharge: 2013-06-13 | Disposition: A | Discharge status: Home / Self Care | Payer: BC Managed Care – PPO | Source: Ambulatory Visit | Attending: Cardiology | Admitting: Cardiology

## 2013-06-16 ENCOUNTER — Encounter (HOSPITAL_COMMUNITY)
Admission: RE | Admit: 2013-06-16 | Discharge: 2013-06-16 | Disposition: A | Discharge status: Home / Self Care | Payer: BC Managed Care – PPO | Source: Ambulatory Visit | Attending: Cardiology | Admitting: Cardiology

## 2013-06-18 ENCOUNTER — Encounter (HOSPITAL_COMMUNITY)
Admission: RE | Admit: 2013-06-18 | Discharge: 2013-06-18 | Disposition: A | Discharge status: Home / Self Care | Payer: BC Managed Care – PPO | Source: Ambulatory Visit | Attending: Cardiology | Admitting: Cardiology

## 2013-06-20 ENCOUNTER — Encounter (HOSPITAL_COMMUNITY)
Admission: RE | Admit: 2013-06-20 | Discharge: 2013-06-20 | Disposition: A | Discharge status: Home / Self Care | Payer: BC Managed Care – PPO | Source: Ambulatory Visit | Attending: Cardiology | Admitting: Cardiology

## 2013-06-23 ENCOUNTER — Encounter (HOSPITAL_COMMUNITY)
Admission: RE | Admit: 2013-06-23 | Discharge: 2013-06-23 | Disposition: A | Discharge status: Home / Self Care | Payer: BC Managed Care – PPO | Source: Ambulatory Visit | Attending: Cardiology | Admitting: Cardiology

## 2013-06-25 ENCOUNTER — Encounter (HOSPITAL_COMMUNITY)
Admission: RE | Admit: 2013-06-25 | Discharge: 2013-06-25 | Disposition: A | Discharge status: Home / Self Care | Payer: BC Managed Care – PPO | Source: Ambulatory Visit | Attending: Cardiology | Admitting: Cardiology

## 2013-06-27 ENCOUNTER — Encounter (HOSPITAL_COMMUNITY)
Admission: RE | Admit: 2013-06-27 | Discharge: 2013-06-27 | Disposition: A | Discharge status: Home / Self Care | Payer: BC Managed Care – PPO | Source: Ambulatory Visit | Attending: Cardiology | Admitting: Cardiology

## 2013-06-30 ENCOUNTER — Encounter (HOSPITAL_COMMUNITY): Payer: BC Managed Care – PPO

## 2013-06-30 ENCOUNTER — Encounter (HOSPITAL_COMMUNITY)
Admission: RE | Admit: 2013-06-30 | Discharge: 2013-06-30 | Disposition: A | Discharge status: Home / Self Care | Payer: BC Managed Care – PPO | Source: Ambulatory Visit | Attending: Cardiology | Admitting: Cardiology

## 2013-06-30 LAB — PROTIME-INR
INR: 2.52 — ABNORMAL HIGH (ref 0.00–1.49)
Prothrombin Time: 26.3 seconds — ABNORMAL HIGH (ref 11.6–15.2)

## 2013-06-30 NOTE — Progress Notes (Unsigned)
Ryan [name] presented for labwork. Labs per MD order drawn via Peripheral Line 23 gauge needle inserted in right AC  Good blood return present. Procedure without incident.  Needle removed intact. Patient tolerated procedure well. Taking coumadin 4 mg daily.

## 2013-07-02 ENCOUNTER — Encounter (HOSPITAL_COMMUNITY)
Admission: RE | Admit: 2013-07-02 | Discharge: 2013-07-02 | Disposition: A | Discharge status: Home / Self Care | Payer: BC Managed Care – PPO | Source: Ambulatory Visit | Attending: Cardiology | Admitting: Cardiology

## 2013-07-04 ENCOUNTER — Encounter (HOSPITAL_COMMUNITY)
Admission: RE | Admit: 2013-07-04 | Discharge: 2013-07-04 | Disposition: A | Discharge status: Home / Self Care | Payer: BC Managed Care – PPO | Source: Ambulatory Visit | Attending: Cardiology | Admitting: Cardiology

## 2013-07-07 ENCOUNTER — Encounter (HOSPITAL_COMMUNITY)
Admission: RE | Admit: 2013-07-07 | Discharge: 2013-07-07 | Disposition: A | Discharge status: Home / Self Care | Payer: BC Managed Care – PPO | Source: Ambulatory Visit | Attending: Cardiology | Admitting: Cardiology

## 2013-07-09 ENCOUNTER — Encounter (HOSPITAL_COMMUNITY)
Admission: RE | Admit: 2013-07-09 | Discharge: 2013-07-09 | Disposition: A | Discharge status: Home / Self Care | Payer: BC Managed Care – PPO | Source: Ambulatory Visit | Attending: Internal Medicine | Admitting: Internal Medicine

## 2013-07-09 DIAGNOSIS — I6529 Occlusion and stenosis of unspecified carotid artery: Secondary | ICD-10-CM | POA: Insufficient documentation

## 2013-07-09 DIAGNOSIS — C50919 Malignant neoplasm of unspecified site of unspecified female breast: Secondary | ICD-10-CM | POA: Insufficient documentation

## 2013-07-11 ENCOUNTER — Encounter (HOSPITAL_COMMUNITY)
Admission: RE | Admit: 2013-07-11 | Discharge: 2013-07-11 | Disposition: A | Discharge status: Home / Self Care | Payer: BC Managed Care – PPO | Source: Ambulatory Visit | Attending: Cardiology | Admitting: Cardiology

## 2013-07-11 ENCOUNTER — Telehealth: Payer: Self-pay | Admitting: *Deleted

## 2013-07-11 ENCOUNTER — Telehealth: Payer: Self-pay | Admitting: Cardiology

## 2013-07-11 NOTE — Telephone Encounter (Signed)
Try cutting Amlodipine in 1/2 & go back to 1/2 bid metoprolol.  Schedule a ROV with PA next week to reasess.  [name] W

## 2013-07-11 NOTE — Telephone Encounter (Signed)
Regarding beta blocker making him dizzy  He stopped taking and heartrate and BP worsened  Need advice from Dr bout possible cutting dose Please call

## 2013-07-11 NOTE — Telephone Encounter (Signed)
Spoke to wife to give her instruction concerning his  Medication. She states he does not take Norvasc.MEDICATION WAS CHANGED BY SURGEON AT LAST APPOINTMENT.  appointment was made for 07/14/2013 with Nada Boozer NP

## 2013-07-11 NOTE — Telephone Encounter (Signed)
Spoke to Hart Rochester RN at [employer] states the patient stop taking Lopressor because he was dizzy and had an unsteady gait 2 days ago. His heart rate and blood pressure was elevated today. He was taking lopressor 50 mg -1/2 tablet twice a day;norvasc10 mg daily, imdur 30 mg daily  She instructed him to restart until  Mr EPP hear back from the office. She will send a copy of today's recordings.  Will defer to Dr Herbie Baltimore  And call patient with the information

## 2013-07-11 NOTE — Telephone Encounter (Signed)
Left message on home phone and cell phone to call back Per Dr Herbie Baltimore decrease Norvasc  1/2 tablet  And continue with Metoprolol and follow up with an extender next week

## 2013-07-11 NOTE — Telephone Encounter (Signed)
Returning a call from [employer].

## 2013-07-14 ENCOUNTER — Telehealth (HOSPITAL_COMMUNITY): Payer: Self-pay | Admitting: *Deleted

## 2013-07-14 ENCOUNTER — Ambulatory Visit (INDEPENDENT_AMBULATORY_CARE_PROVIDER_SITE_OTHER): Payer: BC Managed Care – PPO | Admitting: Cardiology

## 2013-07-14 ENCOUNTER — Encounter (HOSPITAL_COMMUNITY)
Admission: RE | Admit: 2013-07-14 | Discharge: 2013-07-14 | Disposition: A | Discharge status: Home / Self Care | Payer: BC Managed Care – PPO | Source: Ambulatory Visit | Attending: Cardiology | Admitting: Cardiology

## 2013-07-14 ENCOUNTER — Encounter: Payer: Self-pay | Admitting: Cardiology

## 2013-07-14 VITALS — BP 110/60 | HR 75 | Ht 72.3 in | Wt 237.0 lb

## 2013-07-14 DIAGNOSIS — R42 Dizziness and giddiness: Secondary | ICD-10-CM | POA: Insufficient documentation

## 2013-07-14 DIAGNOSIS — D62 Acute posthemorrhagic anemia: Secondary | ICD-10-CM

## 2013-07-14 DIAGNOSIS — D649 Anemia, unspecified: Secondary | ICD-10-CM | POA: Insufficient documentation

## 2013-07-14 DIAGNOSIS — I951 Orthostatic hypotension: Secondary | ICD-10-CM | POA: Insufficient documentation

## 2013-07-14 DIAGNOSIS — Z951 Presence of aortocoronary bypass graft: Secondary | ICD-10-CM

## 2013-07-14 DIAGNOSIS — I6529 Occlusion and stenosis of unspecified carotid artery: Secondary | ICD-10-CM

## 2013-07-14 DIAGNOSIS — I6521 Occlusion and stenosis of right carotid artery: Secondary | ICD-10-CM

## 2013-07-14 HISTORY — DX: Anemia, unspecified: D64.9

## 2013-07-14 LAB — CBC
Platelets: 296 10*3/uL (ref 150–400)
RDW: 16 % — ABNORMAL HIGH (ref 11.5–15.5)
WBC: 10.4 10*3/uL (ref 4.0–10.5)

## 2013-07-14 MED ORDER — METOPROLOL TARTRATE 25 MG PO TABS: 12.5000 mg | ORAL_TABLET | Freq: Every day | ORAL | Status: DC

## 2013-07-14 NOTE — Assessment & Plan Note (Signed)
stable °

## 2013-07-14 NOTE — Assessment & Plan Note (Signed)
Stable, Dr. Donata Clay has signed off.

## 2013-07-14 NOTE — Assessment & Plan Note (Signed)
Will recheck Hgb it was 9.8 in July.

## 2013-07-14 NOTE — Assessment & Plan Note (Signed)
If on lopressor pt with episodic dizzy spells - better off lopressor but then he had increased HR > 120.  He does have drop in BP with position change.  Will change lopressor to 12.5 mg every evening.  Also asked pt to wear knee high support stockings.

## 2013-07-14 NOTE — Progress Notes (Addendum)
07/14/2013   PCP: [name], MD   Chief Complaint  Patient presents with  . Follow-up    has been in cardiac rehab and was told he had an arrythmia, and a low bp; dizzy ; has fallen twice in past 3 months    Primary Cardiologist: Dr. Herbie Baltimore  HPI:  62 year old WMM with history of known carotid artery occlusion and  Recent increasing SOB undergoing cardiac catheterization, which revealed severe left main disease, as well as RCA disease. He was referred for and underwent urgent CABGx3 by Dr. Kathlee Nations Trigt the following day as described below.  Postoperatively, he was having some problems with breathing and was on oxygen. He says his breathing is much better now. His not noting PAB, orthopnea. He is actually walking without significant dyspnea.  He has an extensive history of Hodgkin's disease back in 1983 treated with mantle radiation, and recently diagnosed invasive ductal carcinoma diagnosed 2 years ago, treated with right radical mastectomy mastectomy and chemotherapy. He also had a TIA/CVA back in 2007 which time his have an occluded right internal carotid artery with moderate disease in left internal carotid artery. He is currently on tamoxifen for his breast cancer, so was switched from Aggrenox to warfarin for stroke preven total shoulder ation. He has dyslipidemia with most recent cholesterol panel showing  HDL 38 LDL 112 on a statin.   He had been doing well though BP was dropping and eventually Norvasc stopped and a decrease in his BB.  Recently increased dizzy spells and having to lean into walls.  He has decreased salt significantly and has lost 10 pounds,  His wife stopped his lopressor, on presentation to cardiac rehab HR > 120.  Most likely rebound tachycardia.  Since there was confusion about his meds they came in to be seen.  He is orthostatic today with associated dizziness.  HR is controlled.  No chest pain.  No SOB.     Allergies  Allergen  Reactions  . Penicillins     Childhood Reaction    Current Outpatient Prescriptions  Medication Sig Dispense Refill  . ALPRAZolam (XANAX) 1 MG tablet Take 1 mg by mouth at bedtime as needed for anxiety. Take 0.5 mg by mouth at bedtime as needed for anxiety      . aspirin EC 81 MG tablet Take 81 mg by mouth daily.       . citalopram (CELEXA) 40 MG tablet Take 40 mg by mouth daily.       . Coconut Oil 1000 MG CAPS Take 1,000 mg by mouth daily.      . Garlic 500 MG CAPS Take 500 mg by mouth daily.      . Ginkgo Biloba 60 MG CAPS Take 1 capsule by mouth daily.      Marland Kitchen levothyroxine (SYNTHROID, LEVOTHROID) 50 MCG tablet Take 50 mcg by mouth daily.      . nitroGLYCERIN (NITROSTAT) 0.4 MG SL tablet Place 1 tablet (0.4 mg total) under the tongue every 5 (five) minutes as needed for chest pain.  25 tablet  3  . Omega-3 Fatty Acids (FISH OIL) 1000 MG CPDR Take 1 each by mouth daily.      . simvastatin (ZOCOR) 40 MG tablet Take 40 mg by mouth at bedtime.       . tamoxifen (NOLVADEX) 10 MG tablet Take 10 mg by mouth 2 (two) times daily.      Marland Kitchen warfarin (COUMADIN)  5 MG tablet Take 5 mg by mouth See admin instructions. Day 1: 4 mg, Day 2: 4 mg, Day 3: 5 mg, Day 4: 4 mg, Day 5: 4 mg, Day 6: 5 mg, Day 7: 4 mg. OR AS DIRECTED      . metoprolol tartrate (LOPRESSOR) 25 MG tablet Take 0.5 tablets (12.5 mg total) by mouth at bedtime.  30 tablet  3   No current facility-administered medications for this visit.    Past Medical History  Diagnosis Date  . Invasive ductal carcinoma of breast, stage 2 04/27/2011    chemo, mastectomy  . Right carotid artery occlusion 04/27/2011  . Left carotid artery partial occlusion 04/27/2011  . Stroke     2007  . Hypercholesterolemia   . Adie's pupil   . H/O Hodgkin's disease 35  . Thyroid disease   . S/P CABG x 3  left internal mammary artery to left anterior descending, left radial artery free graft to ramus intermediate, saphenous vein graft to right coronary artery).   04/09/2013  . CAD (coronary artery disease), native coronary artery 04/09/2013    Distal left main stenosis 70-80% (at trifurcation into LAD, Circumflex, and 2.  Ramus branches, 70% mid RCA    Past Surgical History  Procedure Laterality Date  . Mastectomy  rt  . Lymph node biopsy      left neck  . Coronary artery bypass graft [name] 04/08/2013    Procedure: CORONARY ARTERY BYPASS GRAFTING (CABG);  Surgeon: Kerin Perna, MD;  Location: [facility] OR;  Service: Open Heart Surgery;  Laterality: [name];  Times 3 using left internal mammary artery; endoscopically harvested right saphenous vein and left radial artery  . Intraoperative transesophageal echocardiogram [name] 04/08/2013    Procedure: INTRAOPERATIVE TRANSESOPHAGEAL ECHOCARDIOGRAM;  Surgeon: Kerin Perna, MD;  Location: [facility] OR;  Service: Open Heart Surgery;  Laterality: [name];  . Radial artery harvest Left 04/08/2013    Procedure: RADIAL ARTERY HARVEST;  Surgeon: Kerin Perna, MD;  Location: [facility] OR;  Service: Open Heart Surgery;  Laterality: Left;    ZOX:WRUEAVW:UJ colds or fevers, drop in weight from 247 in July to 237 today, Pt had decreased salt significantly. Skin:no rashes or ulcers HEENT:no blurred vision, no congestion CV:see HPI PUL:see HPI GI:no diarrhea constipation or melena, no indigestion GU:no hematuria, no dysuria MS:no joint pain, no claudication Neuro:no syncope, + lightheadedness, + dizzy, falling into walls. On Coumadin for stroke prevention Endo:no diabetes, no thyroid disease  PHYSICAL EXAM BP 110/60  Pulse 75  Ht 6' 0.3" (1.836 m)  Wt 237 lb (107.502 kg)  BMI 31.89 kg/m2 Lying:  180/96  Sitting:  150/90   Standing:  150/90 General:Pleasant affect, NAD Skin:Warm and dry, brisk capillary refill HEENT:normocephalic, sclera clear, mucus membranes moist Neck:supple, no JVD  Heart:S1S2 RRR without murmur, gallup, rub or click Lungs:clear without rales, rhonchi, or wheezes WJX:BJYN, non tender, + BS, do not palpate liver  spleen or masses Ext:no lower ext edema, 2+ pedal pulses, 2+ radial pulses Neuro:alert and oriented, MAE, follows commands, + facial symmetry  EKG:Incomplete RBBB, LAFB, SR   ASSESSMENT AND PLAN Dizzy spells If on lopressor pt with episodic dizzy spells - better off lopressor but then he had increased HR > 120.  He does have drop in BP with position change.  Will change lopressor to 12.5 mg every evening.  Also asked pt to wear knee high support stockings.    Orthostatic hypotension Pt does have 30 mHg drop in systolic BP from lying  to sitting.  BP is elevated otherwise.  Will check CBC and BMP.  Last CBC Hgb 9.8.  H/H followed by oncology. Please note he has stopped most of his salt intake, this my be causing some of the volume shifts.   Anemia associated with acute blood loss, post op and with chronic disease Will recheck Hgb it was 9.8 in July.   S/P CABG x 3  LIMA-LAD, free LRA to RI, SVG-RCA  - 04/09/13 Stable, Dr. Donata Clay has signed off.  Known RICA occlusion (CVA '07) partial LICA stenosis (40-59%) Jan 2014 stable   Also have asked him to increase salt intake a little.  Follow up with Dr. Herbie Baltimore in 3 weeks.

## 2013-07-14 NOTE — Telephone Encounter (Signed)
Ryan [name] called and said that Ryan [name] was out of the 2mg  Warfin and they need them and [facility] [employer] had not heard from [place].

## 2013-07-14 NOTE — Patient Instructions (Signed)
Have lab work done today.  New dose of Lopressor.  25 mg tabs- take half a tab every evening.  If dizziness increases then call us.  Wear support stocking to  Knee.  This will help BP drop with change in positions.    Ok to use some salt in your diet.  Follow up with Dr. Herbie Baltimore in 2-3 weeks.

## 2013-07-14 NOTE — Assessment & Plan Note (Addendum)
Pt does have 30 mHg drop in systolic BP from lying to sitting.  BP is elevated otherwise.  Will check CBC and BMP.  Last CBC Hgb 9.8.  H/H followed by oncology. Please note he has stopped most of his salt intake, this my be causing some of the volume shifts.

## 2013-07-15 ENCOUNTER — Encounter: Payer: Self-pay | Admitting: Cardiology

## 2013-07-15 LAB — BASIC METABOLIC PANEL
Chloride: 105 mEq/L (ref 96–112)
Potassium: 4.4 mEq/L (ref 3.5–5.3)
Sodium: 142 mEq/L (ref 135–145)

## 2013-07-16 ENCOUNTER — Encounter (HOSPITAL_COMMUNITY)
Admission: RE | Admit: 2013-07-16 | Discharge: 2013-07-16 | Disposition: A | Discharge status: Home / Self Care | Payer: BC Managed Care – PPO | Source: Ambulatory Visit | Attending: Cardiology | Admitting: Cardiology

## 2013-07-18 ENCOUNTER — Encounter (HOSPITAL_COMMUNITY)
Admission: RE | Admit: 2013-07-18 | Discharge: 2013-07-18 | Disposition: A | Discharge status: Home / Self Care | Payer: BC Managed Care – PPO | Source: Ambulatory Visit | Attending: Cardiology | Admitting: Cardiology

## 2013-07-18 ENCOUNTER — Other Ambulatory Visit (HOSPITAL_COMMUNITY): Payer: Self-pay | Admitting: Oncology

## 2013-07-18 DIAGNOSIS — I739 Peripheral vascular disease, unspecified: Secondary | ICD-10-CM

## 2013-07-18 DIAGNOSIS — I2510 Atherosclerotic heart disease of native coronary artery without angina pectoris: Secondary | ICD-10-CM

## 2013-07-18 DIAGNOSIS — I6521 Occlusion and stenosis of right carotid artery: Secondary | ICD-10-CM

## 2013-07-18 DIAGNOSIS — C50919 Malignant neoplasm of unspecified site of unspecified female breast: Secondary | ICD-10-CM

## 2013-07-18 MED ORDER — WARFARIN SODIUM 4 MG PO TABS: 4.0000 mg | ORAL_TABLET | Freq: Every day | ORAL | Status: DC

## 2013-07-18 NOTE — Progress Notes (Signed)
Labs called to pt. And pt stated understanding of information

## 2013-07-21 ENCOUNTER — Encounter (HOSPITAL_COMMUNITY)
Admission: RE | Admit: 2013-07-21 | Discharge: 2013-07-21 | Disposition: A | Discharge status: Home / Self Care | Payer: BC Managed Care – PPO | Source: Ambulatory Visit | Attending: Cardiology | Admitting: Cardiology

## 2013-07-23 ENCOUNTER — Encounter (HOSPITAL_COMMUNITY)
Admission: RE | Admit: 2013-07-23 | Discharge: 2013-07-23 | Disposition: A | Discharge status: Home / Self Care | Payer: BC Managed Care – PPO | Source: Ambulatory Visit | Attending: Cardiology | Admitting: Cardiology

## 2013-07-25 ENCOUNTER — Encounter (HOSPITAL_COMMUNITY)
Admission: RE | Admit: 2013-07-25 | Discharge: 2013-07-25 | Disposition: A | Discharge status: Home / Self Care | Payer: BC Managed Care – PPO | Source: Ambulatory Visit | Attending: Cardiology | Admitting: Cardiology

## 2013-07-28 ENCOUNTER — Encounter (HOSPITAL_COMMUNITY)
Admission: RE | Admit: 2013-07-28 | Discharge: 2013-07-28 | Disposition: A | Discharge status: Home / Self Care | Payer: BC Managed Care – PPO | Source: Ambulatory Visit | Attending: Cardiology | Admitting: Cardiology

## 2013-07-28 ENCOUNTER — Encounter (HOSPITAL_BASED_OUTPATIENT_CLINIC_OR_DEPARTMENT_OTHER): Payer: BC Managed Care – PPO

## 2013-07-28 DIAGNOSIS — I6529 Occlusion and stenosis of unspecified carotid artery: Secondary | ICD-10-CM

## 2013-07-28 DIAGNOSIS — C50911 Malignant neoplasm of unspecified site of right female breast: Secondary | ICD-10-CM

## 2013-07-28 LAB — CBC WITH DIFFERENTIAL/PLATELET
Eosinophils Absolute: 0.3 10*3/uL (ref 0.0–0.7)
Eosinophils Relative: 3 % (ref 0–5)
Hemoglobin: 13.7 g/dL (ref 13.0–17.0)
Lymphocytes Relative: 25 % (ref 12–46)
Lymphs Abs: 2.4 10*3/uL (ref 0.7–4.0)
MCH: 27.9 pg (ref 26.0–34.0)
MCV: 86.6 fL (ref 78.0–100.0)
Monocytes Relative: 7 % (ref 3–12)
RBC: 4.91 MIL/uL (ref 4.22–5.81)
WBC: 9.5 10*3/uL (ref 4.0–10.5)

## 2013-07-28 LAB — COMPREHENSIVE METABOLIC PANEL
Albumin: 3.6 g/dL (ref 3.5–5.2)
Alkaline Phosphatase: 41 U/L (ref 39–117)
BUN: 15 mg/dL (ref 6–23)
Calcium: 9.5 mg/dL (ref 8.4–10.5)
Chloride: 103 mEq/L (ref 96–112)
GFR calc Af Amer: 56 mL/min — ABNORMAL LOW (ref >90)
Potassium: 4 mEq/L (ref 3.5–5.1)
Total Protein: 6.9 g/dL (ref 6.0–8.3)

## 2013-07-28 LAB — PROTIME-INR
INR: 2.68 — ABNORMAL HIGH (ref 0.00–1.49)
Prothrombin Time: 27.6 seconds — ABNORMAL HIGH (ref 11.6–15.2)

## 2013-07-28 LAB — LACTATE DEHYDROGENASE: LDH: 198 U/L (ref 94–250)

## 2013-07-28 NOTE — Progress Notes (Signed)
Patient notified to continue same dose of coumadin- 4 mg daily. Repeat in 4 weeks

## 2013-07-28 NOTE — Progress Notes (Signed)
Labs drawn today for cbc/diff,cmp,ldh,pt

## 2013-07-30 ENCOUNTER — Encounter (HOSPITAL_COMMUNITY)
Admission: RE | Admit: 2013-07-30 | Discharge: 2013-07-30 | Disposition: A | Discharge status: Home / Self Care | Payer: BC Managed Care – PPO | Source: Ambulatory Visit | Attending: Cardiology | Admitting: Cardiology

## 2013-07-30 ENCOUNTER — Ambulatory Visit (HOSPITAL_COMMUNITY): Payer: BC Managed Care – PPO

## 2013-07-30 NOTE — Progress Notes (Signed)
Ryan [name]., MD [address] [place] [address]  Invasive ductal carcinoma of breast, stage 2, right - Plan: MM Digital Screening Unilat L, CBC with Differential, Comprehensive metabolic panel  CURRENT THERAPY: Tamoxifen 10 mg BID beginning approximately 03/08/2011.  He will take for 5 full years.  INTERVAL HISTORY: Ryan [name] 62 y.o. male returns for  regular  visit for followup of Stage II cancer the right breast, grade 1, without LV I. ER +96%, PR 4%, Ki-67 marker low at 19%, HER-2/neu nonamplified. Oncotype score was 14.  He was placed on tamoxifen 10 mg twice a day which he will take for 5 full years starting 03/08/2011. He is status post right mastectomy in April 2012 at which time he was found to have a 2.1 cm primary, 3 sentinel nodes were negative.  I personally reviewed and went over laboratory results with the patient.  Labs from 07/28/2013 shows a CBC WNL and metabolic panel that is very stable with a creatinine of 1.47 and renal insufficiency.  I personally reviewed and went over radiographic studies with the patient.  His last CT of chest revealed stability of right lung nodularity and it was recommended at that time a repeat CT chest be performed a year later to establish stability.  We will arrange that for this month or next month.  We reviewed the NCCN guidelines for invasive breast cancer.  NCCN guidelines recommends the following surveillance for invasive breast cancer:  A. History and Physical exam every 4-6 months for 5 years and then every 12 months.  B. Mammography every 12 months  C. Women on Tamoxifen: annual gynecologic assessment every 12 months if uterus is present.  D. Women on aromatase inhibitor or who experience ovarian failure secondary to treatment should have monitoring of bone health with a bone mineral density determination at baseline and periodically thereafter.  E. Assess and encourage adherence to adjuvant  endocrine therapy.  F. Evidence suggests that active lifestyle and achieving and maintaining an ideal body weight (20-25 BMI) may lead to optimal breast cancer outcomes.  With this information, I recommend annual screening mammogram which has not been done or encouraged since his original mammogram in 2012.  He is agreeable to have annual mammogram performed.   He reports that he has already had his annual influenza vaccine.  He reports that he has an appointment with Dr. Dorethea [name], his cardiologist.  He admits to some difficulty with orthostatic hypotension symptoms and this is improving with adjustments in his BP medications.  I encouraged continued follow-up with his cardiologist.  Oncologically, he denies any complaints and ROS questioning is negative.   Past Medical History  Diagnosis Date  . Invasive ductal carcinoma of breast, stage 2 04/27/2011    chemo, mastectomy  . Right carotid artery occlusion 04/27/2011  . Left carotid artery partial occlusion 04/27/2011  . Stroke     2007  . Hypercholesterolemia   . Adie's pupil   . H/O Hodgkin's disease 12  . Thyroid disease   . S/P CABG x 3  left internal mammary artery to left anterior descending, left radial artery free graft to ramus intermediate, saphenous vein graft to right coronary artery).  04/09/2013  . CAD (coronary artery disease), native coronary artery 04/09/2013    Distal left main stenosis 70-80% (at trifurcation into LAD, Circumflex, and 2.  Ramus branches, 70% mid RCA    has Invasive ductal carcinoma of right breast, stage 2- 2012 s/p chemo  and mastectomy; Hodgkin's [id number]; Known RICA occlusion (CVA '07) partial LICA stenosis (40-59%) Jan 2014; Angina, class III - cath 04/07/13- 3V CAD; Hypercholesterolemia; Heart palpitations; Claudication of calf muscles; S/P CABG x 3  LIMA-LAD, free LRA to RI, SVG-RCA  - 04/09/13; Chronic anticoagulation- Coumadin; Obesity (BMI 30-39.9); CAD in native artery - status post CABG x3 (LIMA-LAD,  Left Radial-Ramus Intermedius, SVG-RCA; Dizzy spells; Orthostatic hypotension; and Anemia associated with acute blood loss, post op and with chronic disease on his problem list.     is allergic to penicillins.  [name] does not currently have medications on file.  Past Surgical History  Procedure Laterality Date  . Mastectomy  rt  . Lymph node biopsy      left neck  . Coronary artery bypass graft [name] 04/08/2013    Procedure: CORONARY ARTERY BYPASS GRAFTING (CABG);  Surgeon: Ryan Perna, MD;  Location: [facility] OR;  Service: Open Heart Surgery;  Laterality: [name];  Times 3 using left internal mammary artery; endoscopically harvested right saphenous vein and left radial artery  . Intraoperative transesophageal echocardiogram [name] 04/08/2013    Procedure: INTRAOPERATIVE TRANSESOPHAGEAL ECHOCARDIOGRAM;  Surgeon: Ryan Perna, MD;  Location: [facility] OR;  Service: Open Heart Surgery;  Laterality: [name];  . Radial artery harvest Left 04/08/2013    Procedure: RADIAL ARTERY HARVEST;  Surgeon: Ryan Perna, MD;  Location: [facility] OR;  Service: Open Heart Surgery;  Laterality: Left;    Denies any headaches, dizziness, double vision, fevers, chills, night sweats, nausea, vomiting, diarrhea, constipation, chest pain, heart palpitations, shortness of breath, blood in stool, black tarry stool, urinary pain, urinary burning, urinary frequency, hematuria.   PHYSICAL EXAMINATION  ECOG PERFORMANCE STATUS: 0 - Asymptomatic  Filed Vitals:   07/31/13 0900  BP: 147/72  Pulse: 98  Temp: 97.2 F (36.2 C)  Resp: 16    GENERAL:alert, no distress, well nourished, well developed, comfortable, cooperative and smiling SKIN: skin color, texture, turgor are normal, no rashes or significant lesions HEAD: Normocephalic, No masses, lesions, tenderness or abnormalities EYES: normal, PERRLA, EOMI, Conjunctiva are pink and non-injected EARS: External ears normal OROPHARYNX:mucous membranes are moist  NECK: supple, no  adenopathy, thyroid normal size, non-tender, without nodularity, no stridor, non-tender, trachea midline LYMPH:  no palpable lymphadenopathy, no hepatosplenomegaly BREAST:left breast normal without mass, skin or nipple changes or axillary nodes, right post-mastectomy site well healed and free of suspicious changes LUNGS: clear to auscultation and percussion HEART: regular rate & rhythm, no murmurs, no gallops, S1 normal and S2 normal ABDOMEN:abdomen soft, non-tender, normal bowel sounds, no masses or organomegaly and no hepatosplenomegaly BACK: Back symmetric, no curvature., No CVA tenderness EXTREMITIES:less then 2 second capillary refill, no joint deformities, effusion, or inflammation, no edema, no skin discoloration, no clubbing, no cyanosis, positive findings:  Decreased ROM of left shoulder  NEURO: alert & oriented x 3 with fluent speech, no focal motor/sensory deficits, gait normal    LABORATORY DATA: CBC    Component Value Date/Time   WBC 9.5 07/28/2013 0900   RBC 4.91 07/28/2013 0900   HGB 13.7 07/28/2013 0900   HCT 42.5 07/28/2013 0900   PLT 263 07/28/2013 0900   MCV 86.6 07/28/2013 0900   MCH 27.9 07/28/2013 0900   MCHC 32.2 07/28/2013 0900   RDW 15.6* 07/28/2013 0900   LYMPHSABS 2.4 07/28/2013 0900   MONOABS 0.6 07/28/2013 0900   EOSABS 0.3 07/28/2013 0900   BASOSABS 0.1 07/28/2013 0900      Chemistry      Component  Value Date/Time   NA 140 07/28/2013 0900   K 4.0 07/28/2013 0900   CL 103 07/28/2013 0900   CO2 28 07/28/2013 0900   BUN 15 07/28/2013 0900   CREATININE 1.47* 07/28/2013 0900   CREATININE 1.45* 07/14/2013 1639      Component Value Date/Time   CALCIUM 9.5 07/28/2013 0900   ALKPHOS 41 07/28/2013 0900   AST 26 07/28/2013 0900   ALT 16 07/28/2013 0900   BILITOT 0.3 07/28/2013 0900     Lab Results  Component Value Date   INR 2.68* 07/28/2013   INR 2.52* 06/30/2013   INR 2.64* 06/02/2013     RADIOGRAPHIC STUDIES:  07/30/2013  *RADIOLOGY  REPORT*  Clinical Data: Follow up of pulmonary nodules. History of breast  cancer and Hodgkin's disease.  CT CHEST WITHOUT CONTRAST  Technique: Multidetector CT imaging of the chest was performed  following the standard protocol without IV contrast.  Comparison: 07/23/2012.  Findings: The nodular density in the right lower lobe is stable.  There are no new densities. The lungs remain clear. There are no  effusions.  There is no lymphadenopathy in the mediastinum. The previously  described calcified lymph node in the upper mediastinum is stable.  The heart size remains normal. There is no pericardial effusion.  The caliber of the aorta is unremarkable. The airways are normal.  There are surgical clips in the right axilla. The chest wall is  otherwise normal. Bone windows show no osteolytic or osteoblastic  bone disease. The limited scans of the upper abdomen are  unremarkable.  IMPRESSION:  Stable nodular density in the right lower lobe. A final follow-up  to this is recommended in one year's time. No additional evidence  of pathologic abnormalities.  Original Report Authenticated By: Mervin Hack, M.D.     ASSESSMENT:  1. Stage II cancer the right breast, grade 1, without LV I. ER +96%, PR 4%, Ki-67 marker low at 19%, HER-2/neu nonamplified. Oncotype score was 14.  He was placed on tamoxifen 10 mg twice a day which he will take for 5 full years starting 03/08/2011. He is status post right mastectomy in April 2012 at which time he was found to have a 2.1 cm primary, 3 sentinel nodes were negative  2. Hodgkin's disease in 1983. Radiation therapy with a complete remission at that time.  3. Right carotid artery occlusion with small stroke in 2007 and he is on Coumadin uptake in the tamoxifen.  4. Left carotid artery partial occlusion followed on a regular basis by his vascular surgeon  5. Obesity weighing 249-252 pounds now.  6. Deconditioning  7. History of Adie's pupil 8.  Nodular density in right lower lobe, due for final CT follow-up this month.  Patient Active Problem List   Diagnosis Date Noted  . Dizzy spells 07/14/2013  . Orthostatic hypotension 07/14/2013  . Anemia associated with acute blood loss, post op and with chronic disease 07/14/2013  . Obesity (BMI 30-39.9) 05/05/2013  . CAD in native artery - status post CABG x3 (LIMA-LAD, Left Radial-Ramus Intermedius, SVG-RCA 05/05/2013    Class: Diagnosis of  . Chronic anticoagulation- Coumadin 04/10/2013  . S/P CABG x 3  LIMA-LAD, free LRA to RI, SVG-RCA  - 04/09/13 04/09/2013  . Angina, class III - cath 04/07/13- 3V CAD 04/02/2013  . Heart palpitations 04/02/2013  . Claudication of calf muscles 04/02/2013  . Hypercholesterolemia   . Invasive ductal carcinoma of right breast, stage 2- 2012 s/p chemo and mastectomy 04/27/2011  .  Hodgkin's [id number] 04/27/2011  . Known RICA occlusion (CVA '07) partial LICA stenosis (40-59%) Jan 2014 04/27/2011      PLAN:  1. I personally reviewed and went over laboratory results with the patient. 2. I personally reviewed and went over radiographic studies with the patient. 3. Continue same dose of Coumadin as directed 4. INR as directed 5. Continue Tamoxifen 10 mg BID as prescribed x 5 years (starting on 03/08/2011). 6. Final surveillance CT of chest due this month.  Order placed for CT of chest without contrast for this month or next month for final surveillance CT scan (unless otherwise indicated per report). 7. Influenza vaccine already given at outside facility 8. Mammography this year 9. Return in 6 months for follow-up.   THERAPY PLAN:  Will continue surveillance per NCCN guideline which will include follow-up visits every 4-6 months x 5 years and then annually.   Yearly screening mammography is encouraged.  Additionally, healthy living habits are also recommended and discussed this AM.  He will complete 5 years worth of Tamoxifen in May 2017.  NCCN  guidelines recommends the following surveillance for invasive breast cancer:  A. History and Physical exam every 4-6 months for 5 years and then every 12 months.  B. Mammography every 12 months  C. Women on Tamoxifen: annual gynecologic assessment every 12 months if uterus is present.  D. Women on aromatase inhibitor or who experience ovarian failure secondary to treatment should have monitoring of bone health with a bone mineral density determination at baseline and periodically thereafter.  E. Assess and encourage adherence to adjuvant endocrine therapy.  F. Evidence suggests that active lifestyle and achieving and maintaining an ideal body weight (20-25 BMI) may lead to optimal breast cancer outcomes.   All questions were answered. The patient knows to call the clinic with any problems, questions or concerns. We can certainly see the patient much sooner if necessary.  Patient and plan discussed with Dr. Alla German and he is in agreement with the aforementioned.   Leela Vanbrocklin

## 2013-07-31 ENCOUNTER — Encounter (HOSPITAL_BASED_OUTPATIENT_CLINIC_OR_DEPARTMENT_OTHER): Payer: BC Managed Care – PPO | Admitting: Oncology

## 2013-07-31 ENCOUNTER — Ambulatory Visit (INDEPENDENT_AMBULATORY_CARE_PROVIDER_SITE_OTHER): Payer: BC Managed Care – PPO | Admitting: Cardiology

## 2013-07-31 ENCOUNTER — Encounter: Payer: Self-pay | Admitting: Cardiology

## 2013-07-31 ENCOUNTER — Encounter (HOSPITAL_COMMUNITY): Payer: Self-pay | Admitting: Oncology

## 2013-07-31 VITALS — BP 147/72 | HR 98 | Temp 97.2°F | Resp 16 | Wt 233.0 lb

## 2013-07-31 VITALS — BP 128/70 | HR 100 | Ht 75.0 in | Wt 232.9 lb

## 2013-07-31 DIAGNOSIS — Z7901 Long term (current) use of anticoagulants: Secondary | ICD-10-CM

## 2013-07-31 DIAGNOSIS — Z951 Presence of aortocoronary bypass graft: Secondary | ICD-10-CM

## 2013-07-31 DIAGNOSIS — I951 Orthostatic hypotension: Secondary | ICD-10-CM

## 2013-07-31 DIAGNOSIS — Z8571 Personal history of Hodgkin lymphoma: Secondary | ICD-10-CM

## 2013-07-31 DIAGNOSIS — I209 Angina pectoris, unspecified: Secondary | ICD-10-CM

## 2013-07-31 DIAGNOSIS — R5381 Other malaise: Secondary | ICD-10-CM

## 2013-07-31 DIAGNOSIS — R5383 Other fatigue: Secondary | ICD-10-CM

## 2013-07-31 DIAGNOSIS — C50929 Malignant neoplasm of unspecified site of unspecified male breast: Secondary | ICD-10-CM

## 2013-07-31 DIAGNOSIS — I2510 Atherosclerotic heart disease of native coronary artery without angina pectoris: Secondary | ICD-10-CM

## 2013-07-31 DIAGNOSIS — E7800 Pure hypercholesterolemia, unspecified: Secondary | ICD-10-CM

## 2013-07-31 DIAGNOSIS — I63239 Cerebral infarction due to unspecified occlusion or stenosis of unspecified carotid arteries: Secondary | ICD-10-CM

## 2013-07-31 DIAGNOSIS — C50911 Malignant neoplasm of unspecified site of right female breast: Secondary | ICD-10-CM

## 2013-07-31 NOTE — Patient Instructions (Addendum)
Mostly stable  Lets try holding your Simvastatin for ~ 1 month.  Lets see if that helps your fatigue.  Your physician wants you to follow-up in 6 months Dr Herbie Baltimore.  You will receive a reminder letter in the mail two months in advance. If you don't receive a letter, please call our office to schedule the follow-up appointment.  Marykay Lex, MD

## 2013-07-31 NOTE — Progress Notes (Signed)
PATIENT: Ryan [name] MRN: [id number]  DOB: 06/04/1951   DOV:08/02/2013 PCP: [name], MD  Clinic Note: Chief Complaint  Patient presents with  . ROV 3 months    Still experiencing some dizziness, lack of energy, has numbness at incisional sites-especially his left arm.   HPI: Ryan [name] is a 63 y.o. male with a PMH (recently diagnosed multivessel CAD status post CABG in June of 2014, symptoms were essentially worsening dyspnea on exertion with chest tightness) below who presents today for two-week followup after a recent visit with Nada Boozer, NP. He was seen on October 6 for "arrhythmia" , low blood pressure and dizziness. He has had a couple falls. At that time he was told to simply take his beta blocker one half dose at night. He was also supposed to wear compression stockings. He was definitely noted to be orthostatic, and potentially be a period. I actually last saw him in July, he is mostly is having some sleeping issues. He was about ready start cardiac rehabilitation, he is now close to completing his course. He has not noted any recurrence of his claudication type symptoms. He really has not had any chest pains or pressure with rest or exertion.  F/u of low BP & near syncope.  Reduced BB to 12.5 mg qhs.  Compression stockings. CRH - doing well, breathing is just about to normal; finishes End of Month Palpitations are better.  Interval History: Today he feels a little bladder, still has the dizziness and overall lack of energy. Unfortunately a cardiac rehabilitation, but telling him that his blood pressures are starting to rise up with bruises and quite difficult situation with her at his significant orthostatic changes.  The remainder of Cardiovascular ROS: positive for - Intermittent dizziness & Lacking Energy negative for - chest pain, dyspnea on exertion, edema, irregular heartbeat, loss of consciousness, murmur, orthopnea, palpitations, paroxysmal nocturnal  dyspnea, rapid heart rate or shortness of breath: Additional cardiac review of systems: Lightheadedness - better, dizziness - better, syncope/near-syncope - no; TIA/amaurosis fugax - no Melena - no, hematochezia no; hematuria - no; nosebleeds - no; claudication - no  Past Medical History  Diagnosis Date  . Invasive ductal carcinoma of breast, stage 2 04/27/2011    chemo, mastectomy  . Right carotid artery occlusion 04/27/2011  . Left carotid artery partial occlusion 04/27/2011  . Stroke     2007  . Hypercholesterolemia   . Adie's pupil   . H/O Hodgkin's disease 57  . Thyroid disease   . S/P CABG x 3  left internal mammary artery to left anterior descending, left radial artery free graft to ramus intermediate, saphenous vein graft to right coronary artery).  04/09/2013  . CAD (coronary artery disease), native coronary artery 04/09/2013    Distal left main stenosis 70-80% (at trifurcation into LAD, Circumflex, and 2.  Ramus branches, 70% mid RCA    Prior Cardiac Evaluation and Past Surgical History: Past Surgical History  Procedure Laterality Date  . Mastectomy  rt  . Lymph node biopsy      left neck  . Coronary artery bypass graft [name] 04/08/2013    Procedure: CORONARY ARTERY BYPASS GRAFTING (CABG);  Surgeon: Kerin Perna, MD;  Location: [facility] OR;  Service: Open Heart Surgery;  Laterality: [name];  Times 3 using left internal mammary artery; endoscopically harvested right saphenous vein and left radial artery  . Intraoperative transesophageal echocardiogram [name] 04/08/2013    Procedure: INTRAOPERATIVE TRANSESOPHAGEAL ECHOCARDIOGRAM;  Surgeon: Kathlee Nations Trigt,  MD;  Location: [facility] OR;  Service: Open Heart Surgery;  Laterality: [name];  . Radial artery harvest Left 04/08/2013    Procedure: RADIAL ARTERY HARVEST;  Surgeon: Kerin Perna, MD;  Location: [facility] OR;  Service: Open Heart Surgery;  Laterality: Left;   No Active Allergies  Current Outpatient Prescriptions  Medication Sig Dispense Refill  .  ALPRAZolam (XANAX) 1 MG tablet Take 1 mg by mouth at bedtime as needed for anxiety. Take 0.5 mg by mouth at bedtime as needed for anxiety      . aspirin EC 81 MG tablet Take 81 mg by mouth daily.       . citalopram (CELEXA) 40 MG tablet Take 40 mg by mouth daily.       . Coconut Oil 1000 MG CAPS Take 1,000 mg by mouth daily.      . Garlic 500 MG CAPS Take 500 mg by mouth daily.      . Ginkgo Biloba 60 MG CAPS Take 1 capsule by mouth daily.      Marland Kitchen levothyroxine (SYNTHROID, LEVOTHROID) 50 MCG tablet Take 50 mcg by mouth daily.      . metoprolol tartrate (LOPRESSOR) 25 MG tablet Take 0.5 tablets (12.5 mg total) by mouth at bedtime.  30 tablet  3  . nitroGLYCERIN (NITROSTAT) 0.4 MG SL tablet Place 1 tablet (0.4 mg total) under the tongue every 5 (five) minutes as needed for chest pain.  25 tablet  3  . Omega-3 Fatty Acids (FISH OIL) 1000 MG CPDR Take 1 each by mouth daily.      . simvastatin (ZOCOR) 40 MG tablet Take 40 mg by mouth at bedtime.       . tamoxifen (NOLVADEX) 10 MG tablet Take 10 mg by mouth 2 (two) times daily.      Marland Kitchen warfarin (COUMADIN) 4 MG tablet Take 1 tablet (4 mg total) by mouth daily.  30 tablet  4   No current facility-administered medications for this visit.    History   Social History Narrative  . No narrative on file    ROS: A comprehensive Review of Systems - General ROS: positive for  - fatigue and lack of Energy  PHYSICAL EXAM BP 128/70  Pulse 100  Ht 6\' 3"  (1.905 m)  Wt 232 lb 14.4 oz (105.643 kg)  BMI 29.11 kg/m2 General appearance: alert, cooperative, appears stated age, no distress and mildly obese - mostly truncal; otherwise relatively healthy appearing. Answers questions appropriately. Well-nourished and well-groomed  Neck: no adenopathy, no carotid bruit and no JVD  Lungs: clear to auscultation bilaterally, normal percussion bilaterally and non-labored  Heart: regular rate and rhythm, S1, S2 normal, no murmur, click, rub or gallop; nondisplaced PMI;  well healing scars from his CABG.  Abdomen: soft, non-tender; bowel sounds normal; no masses, no organomegaly; no HJR  Extremities: extremities normal, atraumatic, no cyanosis, and edema ; no significant venous stasis changes or rashes. Wrist site for cath stable.  Pulses: 2+ and symmetric; palpable throughout  Neurologic: Mental status: Alert, oriented, thought content appropriate  Cranial nerves: normal (II-XII grossly intact)   WUJ:[id number] today: No  Recent Labs: 10/2)/2014 - reviewed on Epic; essentially normal with the exception of glucose of 178 and creatinine at 1.47 (estimated GFR 49) - CKD II-III  ASSESSMENT / PLAN: Coronary atherosclerosis of native coronary artery - multivessel CAD, status post CABG x3 (LIMA-LAD, Left Radial-RI, SVG-RCA) Currently asymptomatic from a cardiac standpoint. No recurrence of any angina or dyspnea with rest or exertion.  Unfortunately, with his orthostatic hypotension were limited to the medical therapy we can provide. At this point if it allow for permissive hypertension is probably the best that would like to keep baseline blood pressures between 130 to 150 mmHg systolic.  Angina, class III - cath 04/07/13- 3V CAD  No recurrent symptoms of angina this or dyspnea on exertion status post CABG. He just simply had the fatigue.  Orthostatic hypotension He definitely has some autonomic dysfunction leading to this orthostatic hypotension. I simply think the 30s mmHg drops we need to allow the blood pressure running about 130 systolic. I would not add any further medications for blood pressure.  He is on beta blocker also for history of palpitations which have improved.  He simply needs to stay hydrated, with water/electrolytes to ensure adequate cardiovascular turgor. We may also December to be agreeable to accept a higher than usual blood pressure.    Occlusion and stenosis of carotid artery with cerebral infarction; Known RICA occlusion (CVA '07),  partial LICA stenosis (40-59%) No recurrence of any TIA or amaurosis fugax symptoms. Nothing to suggest significant reduction in flow (beyond the occluded right carotid) on recent Dopplers. Certainly with his carotid disease, there is concern that he would be intolerant of low blood pressures.  Carotid f/u in February 2015  Hypercholesterolemia Currently taking simvastatin, and last set of lipids were at goal. These can be followed, and an annual basis by the primary physician. One thing we do want to make sure is noted no prolonged interruption of the statin.  Fatigue Physical no other his fatigue with activity is related to poor sleep, but could also be related to statin therapy.  Thank: Hold statin for one month to see if symptoms improve.    No orders of the defined types were placed in this encounter.   No orders of the defined types were placed in this encounter.    Followup: 6 months  DAVID W. Herbie Baltimore, M.D., M.S. [facility] [address] [place], [place]  [address]  [phone] Pager # [phone]

## 2013-07-31 NOTE — Patient Instructions (Signed)
[facility] Discharge Instructions  RECOMMENDATIONS MADE BY THE CONSULTANT AND ANY TEST RESULTS WILL BE SENT TO YOUR REFERRING PHYSICIAN.  We will schedule CT scans within 1 month. We will schedule Mammogram within 1 month. Continue Tamoxifen as prescribed. Lab work again in 6 months. MD appointment in 6 months. Report any issues/concerns to clinic as needed.  Thank you for choosing [facility] to provide your oncology and hematology care.  To afford each patient quality time with our providers, please arrive at least 15 minutes before your scheduled appointment time.  With your help, our goal is to use those 15 minutes to complete the necessary work-up to ensure our physicians have the information they need to help with your evaluation and healthcare recommendations.    Effective January 1st, 2014, we ask that you re-schedule your appointment with our physicians should you arrive 10 or more minutes late for your appointment.  We strive to give you quality time with our providers, and arriving late affects you and other patients whose appointments are after yours.    Again, thank you for choosing [facility].  Our hope is that these requests will decrease the amount of time that you wait before being seen by our physicians.       _____________________________________________________________  Should you have questions after your visit to [facility], please contact our office at [phone] between the hours of 8:30 a.m. and 5:00 p.m.  Voicemails left after 4:30 p.m. will not be returned until the following business day.  For prescription refill requests, have your pharmacy contact our office with your prescription refill request.

## 2013-08-01 ENCOUNTER — Encounter (HOSPITAL_COMMUNITY)
Admission: RE | Admit: 2013-08-01 | Discharge: 2013-08-01 | Disposition: A | Discharge status: Home / Self Care | Payer: BC Managed Care – PPO | Source: Ambulatory Visit | Attending: Cardiology | Admitting: Cardiology

## 2013-08-01 ENCOUNTER — Telehealth: Payer: Self-pay | Admitting: *Deleted

## 2013-08-01 NOTE — Telephone Encounter (Signed)
Diane from cardiac rehab was calling regarding [name]'s BP. It is going back up.

## 2013-08-01 NOTE — Telephone Encounter (Signed)
Returned call to Diane w/ Cardiac Rehab.  Stated pt's BP is creeping back up.  BP at rest today was 150/70, exercising 180/80 and back down to 120/?? After cool down.  Stated pt is taking metoprolol 12.5 mg at bedtime and by the time he comes to rehab at 11am his BP is sky high.  Stated she did send a staff message to Dr. Herbie Baltimore and Nada Boozer (NP).  Informed message will be forwarded to Dr. Herbie Baltimore for further instructions.  Diane verbalized understanding.  Also stated pt/wife talked about increasing metoprolol to whole tab (25 mg) daily on their own to see how it does.  Stated she advised them to call Dr. Elissa Hefty office for instructions on med changes.    Message forwarded to Dr. Herbie Baltimore.

## 2013-08-01 NOTE — Telephone Encounter (Signed)
I responded to her.  Marykay Lex, MD

## 2013-08-02 ENCOUNTER — Encounter: Payer: Self-pay | Admitting: Cardiology

## 2013-08-02 DIAGNOSIS — R5383 Other fatigue: Secondary | ICD-10-CM | POA: Insufficient documentation

## 2013-08-02 DIAGNOSIS — I2510 Atherosclerotic heart disease of native coronary artery without angina pectoris: Secondary | ICD-10-CM | POA: Insufficient documentation

## 2013-08-02 NOTE — Assessment & Plan Note (Signed)
Currently asymptomatic from a cardiac standpoint. No recurrence of any angina or dyspnea with rest or exertion.   Unfortunately, with his orthostatic hypotension were limited to the medical therapy we can provide. At this point if it allow for permissive hypertension is probably the best that would like to keep baseline blood pressures between 130 to 150 mmHg systolic.

## 2013-08-02 NOTE — Assessment & Plan Note (Signed)
Currently taking simvastatin, and last set of lipids were at goal. These can be followed, and an annual basis by the primary physician. One thing we do want to make sure is noted no prolonged interruption of the statin.

## 2013-08-02 NOTE — Assessment & Plan Note (Signed)
He definitely has some autonomic dysfunction leading to this orthostatic hypotension. I simply think the 30s mmHg drops we need to allow the blood pressure running about 130 systolic. I would not add any further medications for blood pressure.  He is on beta blocker also for history of palpitations which have improved.  He simply needs to stay hydrated, with water/electrolytes to ensure adequate cardiovascular turgor. We may also December to be agreeable to accept a higher than usual blood pressure.

## 2013-08-02 NOTE — Assessment & Plan Note (Signed)
Physical no other his fatigue with activity is related to poor sleep, but could also be related to statin therapy.  Thank: Hold statin for one month to see if symptoms improve.

## 2013-08-02 NOTE — Assessment & Plan Note (Addendum)
No recurrence of any TIA or amaurosis fugax symptoms. Nothing to suggest significant reduction in flow (beyond the occluded right carotid) on recent Dopplers. Certainly with his carotid disease, there is concern that he would be intolerant of low blood pressures.  Carotid f/u in February 2015

## 2013-08-02 NOTE — Assessment & Plan Note (Signed)
No recurrent symptoms of angina this or dyspnea on exertion status post CABG. He just simply had the fatigue.

## 2013-08-04 ENCOUNTER — Encounter (HOSPITAL_COMMUNITY)
Admission: RE | Admit: 2013-08-04 | Discharge: 2013-08-04 | Disposition: A | Discharge status: Home / Self Care | Payer: BC Managed Care – PPO | Source: Ambulatory Visit | Attending: Cardiology | Admitting: Cardiology

## 2013-08-05 ENCOUNTER — Telehealth: Payer: Self-pay | Admitting: *Deleted

## 2013-08-05 NOTE — Telephone Encounter (Signed)
Message copied by Tobin Chad on Tue Aug 05, 2013  4:06 PM ------      Message from: Marykay Lex      Created: Fri Aug 01, 2013  3:43 PM      Regarding: RE: Patient's BP is elevated       I just saw him yesterday - his BP was fine.              We are trying tot figure out a fine balance between his dramatic symptoms with "relative hypotension".  I am ok with a starting range of BP ~130-150 mmHg systolic.  It will go up during exercise.  I would imagine that we will be able to put him back on something that he takes at lunch time. He will be fine with the pressures he had today.            If his pressure tend remains this way next week, I will start him on Lisinopril @ 2.5 mg that he will take ~10 Am-12 N.            Marykay Lex, MD                  ----- Message -----         From: Rolene Course         Sent: 08/01/2013   2:44 PM           To: Marykay Lex, MD, Nada Boozer, NP      Subject: Patient's BP is elevated                                 Dr. Herbie Baltimore,      Your patient Mr. Shaan Cronce's BP has started to increase again. He is currently taking 12.5 lopressor at bedtime. When he comes to exercise his BP is not controlled. He came pre-exercise at rest at 150/70.  It was 182/80 while on his 2nd exercise station on the Nustep which is a seated piece of equipment. The patient and his wife are concerned about this so I told them I would let you know what is happening here in Cardiac Rehab. Please advise if there is anything you would like for [place] to do or for [place] to tell your patient. Thanks so much.       Diane Robert Bellow      [facility], Manager       ------

## 2013-08-06 ENCOUNTER — Ambulatory Visit (HOSPITAL_COMMUNITY)
Admission: RE | Admit: 2013-08-06 | Discharge: 2013-08-06 | Disposition: A | Discharge status: Home / Self Care | Payer: BC Managed Care – PPO | Source: Ambulatory Visit | Attending: Oncology | Admitting: Oncology

## 2013-08-06 ENCOUNTER — Encounter (HOSPITAL_COMMUNITY)
Admission: RE | Admit: 2013-08-06 | Discharge: 2013-08-06 | Disposition: A | Discharge status: Home / Self Care | Payer: BC Managed Care – PPO | Source: Ambulatory Visit | Attending: Cardiology | Admitting: Cardiology

## 2013-08-06 DIAGNOSIS — C50919 Malignant neoplasm of unspecified site of unspecified female breast: Secondary | ICD-10-CM

## 2013-08-06 DIAGNOSIS — C50929 Malignant neoplasm of unspecified site of unspecified male breast: Secondary | ICD-10-CM | POA: Insufficient documentation

## 2013-08-06 DIAGNOSIS — C50911 Malignant neoplasm of unspecified site of right female breast: Secondary | ICD-10-CM

## 2013-08-06 DIAGNOSIS — C8589 Other specified types of non-Hodgkin lymphoma, extranodal and solid organ sites: Secondary | ICD-10-CM | POA: Insufficient documentation

## 2013-08-06 DIAGNOSIS — R918 Other nonspecific abnormal finding of lung field: Secondary | ICD-10-CM

## 2013-08-06 DIAGNOSIS — I889 Nonspecific lymphadenitis, unspecified: Secondary | ICD-10-CM | POA: Insufficient documentation

## 2013-08-06 DIAGNOSIS — I7090 Unspecified atherosclerosis: Secondary | ICD-10-CM | POA: Insufficient documentation

## 2013-08-06 DIAGNOSIS — J984 Other disorders of lung: Secondary | ICD-10-CM | POA: Insufficient documentation

## 2013-08-08 ENCOUNTER — Encounter (HOSPITAL_COMMUNITY)
Admission: RE | Admit: 2013-08-08 | Discharge: 2013-08-08 | Disposition: A | Discharge status: Home / Self Care | Payer: BC Managed Care – PPO | Source: Ambulatory Visit | Attending: Cardiology | Admitting: Cardiology

## 2013-08-11 ENCOUNTER — Encounter (HOSPITAL_COMMUNITY)
Admission: RE | Admit: 2013-08-11 | Discharge: 2013-08-11 | Disposition: A | Discharge status: Home / Self Care | Payer: BC Managed Care – PPO | Source: Ambulatory Visit | Attending: Internal Medicine | Admitting: Internal Medicine

## 2013-08-11 DIAGNOSIS — I6529 Occlusion and stenosis of unspecified carotid artery: Secondary | ICD-10-CM | POA: Insufficient documentation

## 2013-08-14 ENCOUNTER — Other Ambulatory Visit: Payer: Self-pay

## 2013-08-25 ENCOUNTER — Encounter (HOSPITAL_BASED_OUTPATIENT_CLINIC_OR_DEPARTMENT_OTHER): Payer: BC Managed Care – PPO

## 2013-08-25 DIAGNOSIS — I6529 Occlusion and stenosis of unspecified carotid artery: Secondary | ICD-10-CM

## 2013-08-25 LAB — PROTIME-INR
INR: 3.08 — ABNORMAL HIGH (ref 0.00–1.49)
Prothrombin Time: 30.7 seconds — ABNORMAL HIGH (ref 11.6–15.2)

## 2013-08-25 NOTE — Progress Notes (Signed)
Labs drawn today for pt 

## 2013-09-01 ENCOUNTER — Encounter (HOSPITAL_BASED_OUTPATIENT_CLINIC_OR_DEPARTMENT_OTHER): Payer: BC Managed Care – PPO

## 2013-09-01 DIAGNOSIS — I6529 Occlusion and stenosis of unspecified carotid artery: Secondary | ICD-10-CM

## 2013-09-01 LAB — PROTIME-INR: Prothrombin Time: 25.3 seconds — ABNORMAL HIGH (ref 11.6–15.2)

## 2013-09-01 NOTE — Progress Notes (Signed)
Labs drawn today for pt 

## 2013-09-22 ENCOUNTER — Encounter (HOSPITAL_COMMUNITY): Payer: BC Managed Care – PPO | Attending: Internal Medicine

## 2013-09-22 DIAGNOSIS — I6529 Occlusion and stenosis of unspecified carotid artery: Secondary | ICD-10-CM

## 2013-09-22 LAB — PROTIME-INR: INR: 2.58 — ABNORMAL HIGH (ref 0.00–1.49)

## 2013-09-22 NOTE — Progress Notes (Signed)
Labs drawn today for pt 

## 2013-09-22 NOTE — Progress Notes (Signed)
Spoke with pt's wife. Instruct to continue same dose Coumadin 4 mg daily. RTC 10/20/13 for pt/inr. Verbalized understanding.

## 2013-10-14 ENCOUNTER — Other Ambulatory Visit (HOSPITAL_COMMUNITY): Payer: Self-pay | Admitting: Oncology

## 2013-10-14 DIAGNOSIS — C50911 Malignant neoplasm of unspecified site of right female breast: Secondary | ICD-10-CM

## 2013-10-14 MED ORDER — TAMOXIFEN CITRATE 10 MG PO TABS: 10.0000 mg | ORAL_TABLET | Freq: Two times a day (BID) | ORAL | Status: DC

## 2013-10-14 NOTE — Addendum Note (Signed)
Encounter addended by: Norlene Duel, RN on: 10/14/2013  1:01 PM<BR>     Documentation filed: Notes Section

## 2013-10-14 NOTE — Addendum Note (Signed)
Encounter addended by: Norlene Duel, RN on: 10/14/2013  1:10 PM<BR>     Documentation filed: Notes Section

## 2013-10-14 NOTE — Progress Notes (Signed)
Cardiac Rehabilitation Program Outcomes Report   Orientation:  05/14/2013 !st week Report: 05/23/2013 Graduate Date:  tbd Discharge Date:  tbd # of sessions completed: 3 DX: CABG X 3  Cardiologist: Benita Stabile MD:  Gerarda Fraction Class Time:  11:00  A.  Exercise Program:  Tolerates exercise @ 1.90 METS for 15 minutes and Walk Test Results:  Pre: Pre Walk Test: Resting HR 59, BP 90/62, O2 98%, RPE 6,and RPD 8, 99min HR 79, BP 112/62, O2 97% RPE 9 and RPD 13, Post HR 68, BP 130/72, O2 99%, RPE 6,and RPD 8 Walked 1200 ft at 2.3 mph at 2.7 METS.  B.  Mental Health:  Good mental attitude  C.  Education/Instruction/Skills  Accurately checks own pulse.  Rest:  76  Exercise:  97, Knows THR for exercise and Uses Perceived Exertion Scale and/or Dyspnea Scale  Uses Perceived Exertion Scale and/or Dyspnea Scale  D.  Nutrition/Weight Control/Body Composition:  Adherence to prescribed nutrition program: good    E.  Blood Lipids    Lab Results  Component Value Date   CHOL 189 03/22/2011   HDL 38* 03/22/2011   LDLCALC 112* 03/22/2011   TRIG 194* 03/22/2011   CHOLHDL 5.0 03/22/2011    F.  Lifestyle Changes:  Making positive lifestyle changes and Not smoking:  Quit 1983  G.  Symptoms noted with exercise:  Asymptomatic  Report Completed By:  Oletta Lamas. [name] RN   Comments:  This is patients 1st week Report He achieved a peak METS of 1.90. His resting HR was 76 and resting BP was 128/70, Peak Hr was 97 and peak BP was 142/62. A report will follow upon patients Halfway report his 18th visit.

## 2013-10-14 NOTE — Addendum Note (Signed)
Encounter addended by: Norlene Duel, RN on: 10/14/2013  1:11 PM<BR>     Documentation filed: Clinical Notes

## 2013-10-14 NOTE — Addendum Note (Signed)
Encounter addended by: Norlene Duel, RN on: 10/14/2013  1:32 PM<BR>     Documentation filed: Clinical Notes

## 2013-10-14 NOTE — Addendum Note (Signed)
Encounter addended by: Norlene Duel, RN on: 10/14/2013  1:31 PM<BR>     Documentation filed: Notes Section

## 2013-10-14 NOTE — Progress Notes (Signed)
Cardiac Rehabilitation Program Outcomes Report   Orientation:  0806/2014 Graduate Date:  08/11/2013 Discharge Date:  08/11/2013 # of sessions completed: 36 DX: CABG X 3  Cardiologist: Benita Stabile MD:  Gerarda Fraction Class Time:  11:00  A.  Exercise Program:  Tolerates exercise @ 3.88 METS for 15 minutes, Walk Test Results:  Post: Post Walk Test: resting HR 82, BP 152/80, O2 100%, RPE 6, RPD 6, 6 min HR 109, BP 190/80, O2 97%, RPE 10 and RPE 9, post HR 85, BP 150/90, O2 99%, RPE 6 and rpd 6.. and Discharged to home exercise program.  Anticipated compliance:  excellent  B.  Mental Health:  Good mental attitude  C.  Education/Instruction/Skills  Accurately checks own pulse.  Rest:  85  Exercise:  110, Knows THR for exercise, Uses Perceived Exertion Scale and/or Dyspnea Scale and Attended 13 education classes  Uses Perceived Exertion Scale and/or Dyspnea Scale  D.  Nutrition/Weight Control/Body Composition:  Adherence to prescribed nutrition program: good    E.  Blood Lipids    Lab Results  Component Value Date   CHOL 189 03/22/2011   HDL 38* 03/22/2011   LDLCALC 112* 03/22/2011   TRIG 194* 03/22/2011   CHOLHDL 5.0 03/22/2011    F.  Lifestyle Changes:  Making positive lifestyle changes and Not smoking:  Quit 1983  G.  Symptoms noted with exercise:  Asymptomatic  Report Completed By:  Oletta Lamas. Anitra Doxtater RN   Comments:  This is patients graduation report. He achieved a Peak METS of 3.88. He has done well while in rehab. His resting HR was 85 and resting BP was 138/70, and His peak HR was 110 and peak BP was 142/70. A call will be made to patient at 1 month, 6 monthsans 1 year to assure compliance with exercise.

## 2013-10-14 NOTE — Addendum Note (Signed)
Encounter addended by: Norlene Duel, RN on: 10/14/2013  1:02 PM<BR>     Documentation filed: Clinical Notes

## 2013-10-14 NOTE — Progress Notes (Signed)
Cardiac Rehabilitation Program08 Outcomes Report   Orientation:  05/14/2013 Halfway Report: 06/30/2013 Graduate Date:  tbd Discharge Date:  tbd # of sessions completed: 18 DX: CABG X 3  Cardiologist: Benita Stabile MD:  Gerarda Fraction Class Time:  11:00  A.  Exercise Program:  Tolerates exercise @ 3.88 METS for 15 minutes  B.  Mental Health:  Good mental attitude  C.  Education/Instruction/Skills  Accurately checks own pulse.  Rest:  76  Exercise: 103, Knows THR for exercise and Uses Perceived Exertion Scale and/or Dyspnea Scale  Uses Perceived Exertion Scale and/or Dyspnea Scale  D.  Nutrition/Weight Control/Body Composition:  Adherence to prescribed nutrition program: good    E.  Blood Lipids    Lab Results  Component Value Date   CHOL 189 03/22/2011   HDL 38* 03/22/2011   LDLCALC 112* 03/22/2011   TRIG 194* 03/22/2011   CHOLHDL 5.0 03/22/2011    F.  Lifestyle Changes:  Making positive lifestyle changes and Not smoking:  Quit 1983  G.  Symptoms noted with exercise:  Asymptomatic  Report Completed By:  Oletta Lamas. Haidyn Kilburg RN   Comments:  This is patients Halfway report. He achieved a peak METS of 3.88. His resting HR was 76 and resting BP was 130/70. Peak HR was 103 and peak BP was 104/62. A graduation report will follow upon his 26 th visit.

## 2013-10-20 ENCOUNTER — Encounter (HOSPITAL_COMMUNITY): Payer: BC Managed Care – PPO | Attending: Internal Medicine

## 2013-10-20 DIAGNOSIS — I6529 Occlusion and stenosis of unspecified carotid artery: Secondary | ICD-10-CM | POA: Insufficient documentation

## 2013-10-20 LAB — PROTIME-INR
INR: 2.8 — AB (ref 0.00–1.49)
PROTHROMBIN TIME: 28.5 s — AB (ref 11.6–15.2)

## 2013-10-20 NOTE — Progress Notes (Signed)
Labs drawn today for pt 

## 2013-10-20 NOTE — Progress Notes (Signed)
Same dose of Coumadin. INR in 4 weeks. Message left on patient's voicemail regarding appt date and time as well as coumadin instructions.

## 2013-11-11 ENCOUNTER — Encounter: Payer: Self-pay | Admitting: Vascular Surgery

## 2013-11-12 ENCOUNTER — Ambulatory Visit (HOSPITAL_COMMUNITY)
Admission: RE | Admit: 2013-11-12 | Discharge: 2013-11-12 | Disposition: A | Discharge status: Home / Self Care | Payer: BC Managed Care – PPO | Source: Ambulatory Visit | Attending: Vascular Surgery | Admitting: Vascular Surgery

## 2013-11-12 ENCOUNTER — Encounter: Payer: Self-pay | Admitting: Vascular Surgery

## 2013-11-12 ENCOUNTER — Ambulatory Visit (INDEPENDENT_AMBULATORY_CARE_PROVIDER_SITE_OTHER): Payer: BC Managed Care – PPO | Admitting: Vascular Surgery

## 2013-11-12 VITALS — BP 160/91 | HR 89 | Ht 75.0 in | Wt 244.6 lb

## 2013-11-12 DIAGNOSIS — I658 Occlusion and stenosis of other precerebral arteries: Secondary | ICD-10-CM | POA: Insufficient documentation

## 2013-11-12 DIAGNOSIS — I6529 Occlusion and stenosis of unspecified carotid artery: Secondary | ICD-10-CM | POA: Insufficient documentation

## 2013-11-12 NOTE — Assessment & Plan Note (Signed)
He has a known right internal carotid artery occlusion. The left carotid has a less than 40% stenosis. We will continue to follow this on a yearly basis. I've ordered a follow up carotid duplex scan in 1 year and we will see him back at that time. In the meantime he knows to continue taking his aspirin. He knows to call sooner if he has problems.

## 2013-11-12 NOTE — Addendum Note (Signed)
Addended by: Mena Goes on: 11/12/2013 05:37 PM   Modules accepted: Orders

## 2013-11-12 NOTE — Progress Notes (Signed)
[facility] [place]  Patient name: Ryan [name] MRN: [id number] DOB: June 03, 1951 Sex: male  REASON FOR VISIT: Follow up of left carotid stenosis.  HPI: Ryan [name] is a 63 y.o. male who has a known right internal carotid artery occlusion. He comes in for a follow up carotid duplex scan as we are following the left side closely. Since I saw him last, he denies any history of stroke, TIAs, expressive or receptive aphasia, or amaurosis fugax. His original right hemispheric stroke was in 2007 and was associated with left-sided weakness.  Since I saw him last he has undergone coronary revascularization was done very well from that standpoint. He denies any recent chest pain or chest pressure.   Past Medical History  Diagnosis Date  . Invasive ductal carcinoma of breast, stage 2 04/27/2011    chemo, mastectomy  . Right carotid artery occlusion 04/27/2011  . Left carotid artery partial occlusion 04/27/2011  . Stroke     2007  . Hypercholesterolemia   . Adie's pupil   . H/O Hodgkin's disease 79  . Thyroid disease   . S/P CABG x 3  left internal mammary artery to left anterior descending, left radial artery free graft to ramus intermediate, saphenous vein graft to right coronary artery).  04/09/2013  . CAD (coronary artery disease), native coronary artery 04/09/2013    Distal left main stenosis 70-80% (at trifurcation into LAD, Circumflex, and 2.  Ramus branches, 70% mid RCA   Family History  Problem Relation Age of Onset  . Alzheimer's disease Mother   . Stroke Mother   . Hypertension Mother   . Hypertension Father   . Hyperlipidemia Mother   . Diabetes Mother   . Diabetes Paternal Grandmother   . Diabetes Paternal Grandfather    SOCIAL HISTORY: History  Substance Use Topics  . Smoking status: Former Smoker -- 1.00 packs/day    Types: Cigarettes    Quit date: 06/09/1982  . Smokeless tobacco: Never Used  . Alcohol Use: No   No Known Allergies Current  Outpatient Prescriptions  Medication Sig Dispense Refill  . ALPRAZolam (XANAX) 1 MG tablet Take 1 mg by mouth at bedtime as needed for anxiety. Take 0.5 mg by mouth at bedtime as needed for anxiety      . aspirin EC 81 MG tablet Take 81 mg by mouth daily.       . citalopram (CELEXA) 40 MG tablet Take 40 mg by mouth daily.       . Coconut Oil 1000 MG CAPS Take 1,000 mg by mouth daily.      . Garlic 829 MG CAPS Take 500 mg by mouth daily.      . Ginkgo Biloba 60 MG CAPS Take 1 capsule by mouth daily.      Marland Kitchen levothyroxine (SYNTHROID, LEVOTHROID) 50 MCG tablet Take 50 mcg by mouth daily.      . metoprolol tartrate (LOPRESSOR) 25 MG tablet Take 0.5 tablets (12.5 mg total) by mouth at bedtime.  30 tablet  3  . nitroGLYCERIN (NITROSTAT) 0.4 MG SL tablet Place 1 tablet (0.4 mg total) under the tongue every 5 (five) minutes as needed for chest pain.  25 tablet  3  . Omega-3 Fatty Acids (FISH OIL) 1000 MG CPDR Take 1 each by mouth daily.      . simvastatin (ZOCOR) 40 MG tablet Take 40 mg by mouth at bedtime.       . tamoxifen (NOLVADEX) 10 MG tablet Take  1 tablet (10 mg total) by mouth 2 (two) times daily.  60 tablet  5  . warfarin (COUMADIN) 4 MG tablet Take 1 tablet (4 mg total) by mouth daily.  30 tablet  4   No current facility-administered medications for this visit.   REVIEW OF SYSTEMS: Valu.Nieves ] denotes positive finding; [  ] denotes negative finding  CARDIOVASCULAR:  [ ]  chest pain   [ ]  chest pressure   [ ]  palpitations   [ ]  orthopnea   [ ]  dyspnea on exertion   [ ]  claudication   [ ]  rest pain   [ ]  DVT   [ ]  phlebitis PULMONARY:   [ ]  productive cough   [ ]  asthma   [ ]  wheezing NEUROLOGIC:   [ ]  weakness  [ ]  paresthesias  [ ]  aphasia  [ ]  amaurosis  [ ]  dizziness HEMATOLOGIC:   [ ]  bleeding problems   [ ]  clotting disorders MUSCULOSKELETAL:  [ ]  joint pain   [ ]  joint swelling [ ]  leg swelling GASTROINTESTINAL: [ ]   blood in stool  [ ]   hematemesis GENITOURINARY:  [ ]   dysuria  [ ]    hematuria PSYCHIATRIC:  [ ]  history of major depression INTEGUMENTARY:  [ ]  rashes  [ ]  ulcers CONSTITUTIONAL:  [ ]  fever   [ ]  chills  PHYSICAL EXAM: Filed Vitals:   11/12/13 1110 11/12/13 1114  BP: 154/90 160/91  Pulse: 89   Height: 6\' 3"  (1.905 m)   Weight: 244 lb 9.6 oz (110.95 kg)   SpO2: 99%    Body mass index is 30.57 kg/(m^2). GENERAL: The patient is a well-nourished male, in no acute distress. The vital signs are documented above. CARDIOVASCULAR: There is a regular rate and rhythm. I do not detect carotid bruits. PULMONARY: There is good air exchange bilaterally without wheezing or rales. ABDOMEN: Soft and non-tender with normal pitched bowel sounds.  MUSCULOSKELETAL: There are no major deformities or cyanosis. NEUROLOGIC: No focal weakness or paresthesias are detected. SKIN: There are no ulcers or rashes noted. PSYCHIATRIC: The patient has a normal affect.  DATA:  I have independently interpreted his carotid duplex scan. He has a known right internal carotid artery occlusion. He has a less than 40% left internal carotid artery stenosis. Both vertebral arteries are patent with antegrade flow.  MEDICAL ISSUES:  Occlusion and stenosis of carotid artery without mention of cerebral infarction He has a known right internal carotid artery occlusion. The left carotid has a less than 40% stenosis. We will continue to follow this on a yearly basis. I've ordered a follow up carotid duplex scan in 1 year and we will see him back at that time. In the meantime he knows to continue taking his aspirin. He knows to call sooner if he has problems.    Return in about 1 year (around 11/12/2014).   [facility] Beeper: [phone]

## 2013-11-17 ENCOUNTER — Other Ambulatory Visit (HOSPITAL_COMMUNITY): Payer: Self-pay

## 2013-11-17 DIAGNOSIS — Z7901 Long term (current) use of anticoagulants: Secondary | ICD-10-CM

## 2013-11-24 ENCOUNTER — Encounter (HOSPITAL_COMMUNITY): Payer: BC Managed Care – PPO | Attending: Internal Medicine

## 2013-11-24 DIAGNOSIS — Z7901 Long term (current) use of anticoagulants: Secondary | ICD-10-CM

## 2013-11-24 LAB — PROTIME-INR
INR: 2.64 — AB (ref 0.00–1.49)
Prothrombin Time: 27.3 seconds — ABNORMAL HIGH (ref 11.6–15.2)

## 2013-11-24 NOTE — Progress Notes (Signed)
Labs drawn today for pt 

## 2013-12-16 ENCOUNTER — Other Ambulatory Visit (HOSPITAL_COMMUNITY): Payer: Self-pay | Admitting: Oncology

## 2013-12-16 DIAGNOSIS — I2510 Atherosclerotic heart disease of native coronary artery without angina pectoris: Secondary | ICD-10-CM

## 2013-12-16 DIAGNOSIS — I6521 Occlusion and stenosis of right carotid artery: Secondary | ICD-10-CM

## 2013-12-16 DIAGNOSIS — I739 Peripheral vascular disease, unspecified: Secondary | ICD-10-CM

## 2013-12-16 MED ORDER — WARFARIN SODIUM 4 MG PO TABS: 4.0000 mg | ORAL_TABLET | Freq: Every day | ORAL | Status: DC

## 2013-12-22 ENCOUNTER — Encounter (HOSPITAL_COMMUNITY): Payer: BC Managed Care – PPO | Attending: Internal Medicine

## 2013-12-22 ENCOUNTER — Telehealth (HOSPITAL_COMMUNITY): Payer: Self-pay

## 2013-12-22 DIAGNOSIS — Z7901 Long term (current) use of anticoagulants: Secondary | ICD-10-CM | POA: Insufficient documentation

## 2013-12-22 LAB — PROTIME-INR
INR: 2.36 — ABNORMAL HIGH (ref 0.00–1.49)
PROTHROMBIN TIME: 25 s — AB (ref 11.6–15.2)

## 2013-12-22 NOTE — Progress Notes (Signed)
Labs drawn today for pt 

## 2014-01-22 ENCOUNTER — Encounter (HOSPITAL_COMMUNITY): Payer: BC Managed Care – PPO | Attending: Internal Medicine

## 2014-01-22 DIAGNOSIS — I2510 Atherosclerotic heart disease of native coronary artery without angina pectoris: Secondary | ICD-10-CM

## 2014-01-22 DIAGNOSIS — I6521 Occlusion and stenosis of right carotid artery: Secondary | ICD-10-CM

## 2014-01-22 DIAGNOSIS — Z7901 Long term (current) use of anticoagulants: Secondary | ICD-10-CM | POA: Insufficient documentation

## 2014-01-22 DIAGNOSIS — C8190 Hodgkin lymphoma, unspecified, unspecified site: Secondary | ICD-10-CM | POA: Insufficient documentation

## 2014-01-22 DIAGNOSIS — C50919 Malignant neoplasm of unspecified site of unspecified female breast: Secondary | ICD-10-CM | POA: Insufficient documentation

## 2014-01-22 DIAGNOSIS — C50911 Malignant neoplasm of unspecified site of right female breast: Secondary | ICD-10-CM

## 2014-01-22 LAB — COMPREHENSIVE METABOLIC PANEL
ALT: 17 U/L (ref 0–53)
AST: 18 U/L (ref 0–37)
Albumin: 3.4 g/dL — ABNORMAL LOW (ref 3.5–5.2)
Alkaline Phosphatase: 48 U/L (ref 39–117)
BUN: 15 mg/dL (ref 6–23)
CALCIUM: 9.1 mg/dL (ref 8.4–10.5)
CO2: 28 mEq/L (ref 19–32)
Chloride: 100 mEq/L (ref 96–112)
Creatinine, Ser: 1.45 mg/dL — ABNORMAL HIGH (ref 0.50–1.35)
GFR calc Af Amer: 57 mL/min — ABNORMAL LOW (ref >90)
GFR calc non Af Amer: 49 mL/min — ABNORMAL LOW (ref >90)
Glucose, Bld: 180 mg/dL — ABNORMAL HIGH (ref 70–99)
Potassium: 4.3 mEq/L (ref 3.7–5.3)
SODIUM: 141 meq/L (ref 137–147)
Total Bilirubin: 0.4 mg/dL (ref 0.3–1.2)
Total Protein: 7 g/dL (ref 6.0–8.3)

## 2014-01-22 LAB — PROTIME-INR
INR: 2.91 — ABNORMAL HIGH (ref 0.00–1.49)
Prothrombin Time: 29.4 seconds — ABNORMAL HIGH (ref 11.6–15.2)

## 2014-01-22 LAB — CBC WITH DIFFERENTIAL/PLATELET
BASOS ABS: 0.1 10*3/uL (ref 0.0–0.1)
Basophils Relative: 1 % (ref 0–1)
EOS ABS: 0.3 10*3/uL (ref 0.0–0.7)
Eosinophils Relative: 3 % (ref 0–5)
HCT: 45.7 % (ref 39.0–52.0)
Hemoglobin: 14.7 g/dL (ref 13.0–17.0)
LYMPHS PCT: 25 % (ref 12–46)
Lymphs Abs: 2.6 10*3/uL (ref 0.7–4.0)
MCH: 29.6 pg (ref 26.0–34.0)
MCHC: 32.2 g/dL (ref 30.0–36.0)
MCV: 92.1 fL (ref 78.0–100.0)
Monocytes Absolute: 0.8 10*3/uL (ref 0.1–1.0)
Monocytes Relative: 8 % (ref 3–12)
Neutro Abs: 6.7 10*3/uL (ref 1.7–7.7)
Neutrophils Relative %: 64 % (ref 43–77)
PLATELETS: 271 10*3/uL (ref 150–400)
RBC: 4.96 MIL/uL (ref 4.22–5.81)
RDW: 13.6 % (ref 11.5–15.5)
WBC: 10.5 10*3/uL (ref 4.0–10.5)

## 2014-01-22 NOTE — Progress Notes (Signed)
Labs drawn today for cbc/diff,cmp,pt

## 2014-01-26 ENCOUNTER — Other Ambulatory Visit (HOSPITAL_COMMUNITY): Payer: BC Managed Care – PPO

## 2014-01-26 NOTE — Progress Notes (Signed)
Ryan [name]., MD [address] [place] [address]  Invasive ductal carcinoma of right breast, stage 2- 2012 s/p chemo and mastectomy - Plan: MM Digital Screening Unilat L, CBC with Differential, Comprehensive metabolic panel, tamoxifen (NOLVADEX) 20 MG tablet  Hodgkin's [phone] - Plan: CBC with Differential, Comprehensive metabolic panel  Chronic anticoagulation- Coumadin  CURRENT THERAPY: Tamoxifen 10 mg BID beginning approximately 03/08/2011. He will take for 5 full years. Changing to 1 tablet 20 mg daily.  INTERVAL HISTORY: Ryan [name] 63 y.o. male returns for  regular  visit for followup of Stage II cancer the right breast, grade 1, without LV I. ER +96%, PR 4%, Ki-67 marker low at 19%, HER-2/neu nonamplified. Oncotype score was 14. He was placed on tamoxifen 10 mg twice a day which he will take for 5 full years starting 03/08/2011. He is status post right mastectomy in April 2012 at which time he was found to have a 2.1 cm primary, 3 sentinel nodes were negative.    Invasive ductal carcinoma of right breast, stage 2- 2012 s/p chemo and mastectomy   12/14/2010 Initial Diagnosis Right needle core biopsy at 9 o'clock position positive for invasive ductal carcinoma   01/17/2011 Surgery Right simple mastectomy showing a 2.1 cm invasive ductal carcinoma, clear marhins, 0/2 lymph nodes.  ER 96%, PR 84%, Ki-67 19%, Her2 negative.   03/08/2011 -  Chemotherapy Tamoxifen x 5 years    Hodgkin's disease-1983   04/27/2011 Initial Diagnosis Hodgkin's disease-1983     I personally reviewed and went over laboratory results with the patient.  The results are noted within this dictation.  NCCN guidelines recommends the following surveillance for invasive breast cancer:  A. History and Physical exam every 4-6 months for 5 years and then every 12 months.  B. Mammography every 12 months  C. Women on Tamoxifen: annual gynecologic assessment every 12 months if uterus  is present.  D. Women on aromatase inhibitor or who experience ovarian failure secondary to treatment should have monitoring of bone health with a bone mineral density determination at baseline and periodically thereafter.  E. Assess and encourage adherence to adjuvant endocrine therapy.  F. Evidence suggests that active lifestyle and achieving and maintaining an ideal body weight (20-25 BMI) may lead to optimal breast cancer outcomes.   Oncologically, he denies any complaints and ROS questioning is negative.    We spent some time discussing his weight and he will work on weight loss moving forward.   Past Medical History  Diagnosis Date  . Invasive ductal carcinoma of breast, stage 2 04/27/2011    chemo, mastectomy  . Right carotid artery occlusion 04/27/2011  . Left carotid artery partial occlusion 04/27/2011  . Stroke     2007  . Hypercholesterolemia   . Adie's pupil   . H/O Hodgkin's disease 74  . Thyroid disease   . S/P CABG x 3  left internal mammary artery to left anterior descending, left radial artery free graft to ramus intermediate, saphenous vein graft to right coronary artery).  04/09/2013  . CAD (coronary artery disease), native coronary artery 04/09/2013    Distal left main stenosis 70-80% (at trifurcation into LAD, Circumflex, and 2.  Ramus branches, 70% mid RCA    has Invasive ductal carcinoma of right breast, stage 2- 2012 s/p chemo and mastectomy; Hodgkin's [id number]; Known RICA occlusion (CVA '07) partial LICA stenosis (24-09%) Jan 2014; Angina, class III - cath 04/07/13- 3V CAD; Hypercholesterolemia; Heart palpitations; Claudication of  calf muscles; S/P CABG x 3  LIMA-LAD, free LRA to RI, SVG-RCA  - 04/09/13; Chronic anticoagulation- Coumadin; Obesity (BMI 30-39.9); CAD in native artery - status post CABG x3 (LIMA-LAD, Left Radial-Ramus Intermedius, SVG-RCA; Dizzy spells; Orthostatic hypotension; Anemia associated with acute blood loss, post op and with chronic disease;  Coronary atherosclerosis of native coronary artery - multivessel CAD, status post CABG x3 (LIMA-LAD, Left Radial-RI, SVG-RCA); Occlusion and stenosis of carotid artery with cerebral infarction; Known RICA occlusion (CVA '07), partial LICA stenosis (17-40%); Fatigue; and Occlusion and stenosis of carotid artery without mention of cerebral infarction on his problem list.     has No Known Allergies.  [name] had no medications administered during this visit.  Past Surgical History  Procedure Laterality Date  . Mastectomy  rt  . Lymph node biopsy      left neck  . Coronary artery bypass graft [name] 04/08/2013    Procedure: CORONARY ARTERY BYPASS GRAFTING (CABG);  Surgeon: Ivin Poot, MD;  Location: [facility];  Service: Open Heart Surgery;  Laterality: [name];  Times 3 using left internal mammary artery; endoscopically harvested right saphenous vein and left radial artery  . Intraoperative transesophageal echocardiogram [name] 04/08/2013    Procedure: INTRAOPERATIVE TRANSESOPHAGEAL ECHOCARDIOGRAM;  Surgeon: Ivin Poot, MD;  Location: [facility];  Service: Open Heart Surgery;  Laterality: [name];  . Radial artery harvest Left 04/08/2013    Procedure: RADIAL ARTERY HARVEST;  Surgeon: Ivin Poot, MD;  Location: [facility];  Service: Open Heart Surgery;  Laterality: Left;    Denies any headaches, dizziness, double vision, fevers, chills, night sweats, nausea, vomiting, diarrhea, constipation, chest pain, heart palpitations, shortness of breath, blood in stool, black tarry stool, urinary pain, urinary burning, urinary frequency, hematuria.   PHYSICAL EXAMINATION  ECOG PERFORMANCE STATUS: 0 - Asymptomatic  Filed Vitals:   01/29/14 1000  BP: 99/69  Pulse: 84  Temp: 97.5 F (36.4 C)  Resp: 18    GENERAL:alert, no distress, well nourished, well developed, comfortable, cooperative, smiling and overweight SKIN: skin color, texture, turgor are normal, no rashes or significant lesions HEAD: Normocephalic, No  masses, lesions, tenderness or abnormalities EYES: normal, PERRLA, EOMI, Conjunctiva are pink and non-injected EARS: External ears normal OROPHARYNX:mucous membranes are moist  NECK: supple, no adenopathy, thyroid normal size, non-tender, without nodularity, no stridor, non-tender, trachea midline LYMPH:  no palpable lymphadenopathy, no hepatosplenomegaly BREAST:left breast normal without mass, skin or nipple changes or axillary nodes, right post-mastectomy site well healed and free of suspicious changes LUNGS: clear to auscultation and percussion HEART: regular rate & rhythm, no murmurs, no gallops, S1 normal and S2 normal ABDOMEN:abdomen soft, non-tender, normal bowel sounds and no masses or organomegaly BACK: Back symmetric, no curvature. EXTREMITIES:less then 2 second capillary refill, no joint deformities, effusion, or inflammation, no edema, no skin discoloration, no clubbing, no cyanosis  NEURO: alert & oriented x 3 with fluent speech, no focal motor/sensory deficits, gait normal    LABORATORY DATA: CBC    Component Value Date/Time   WBC 10.5 01/22/2014 0900   RBC 4.96 01/22/2014 0900   HGB 14.7 01/22/2014 0900   HCT 45.7 01/22/2014 0900   PLT 271 01/22/2014 0900   MCV 92.1 01/22/2014 0900   MCH 29.6 01/22/2014 0900   MCHC 32.2 01/22/2014 0900   RDW 13.6 01/22/2014 0900   LYMPHSABS 2.6 01/22/2014 0900   MONOABS 0.8 01/22/2014 0900   EOSABS 0.3 01/22/2014 0900   BASOSABS 0.1 01/22/2014 0900  Chemistry      Component Value Date/Time   NA 141 01/22/2014 0900   K 4.3 01/22/2014 0900   CL 100 01/22/2014 0900   CO2 28 01/22/2014 0900   BUN 15 01/22/2014 0900   CREATININE 1.45* 01/22/2014 0900   CREATININE 1.45* 07/14/2013 1639      Component Value Date/Time   CALCIUM 9.1 01/22/2014 0900   ALKPHOS 48 01/22/2014 0900   AST 18 01/22/2014 0900   ALT 17 01/22/2014 0900   BILITOT 0.4 01/22/2014 0900     Lab Results  Component Value Date   INR 2.91* 01/22/2014   INR 2.36* 12/22/2013    INR 2.64* 11/24/2013      ASSESSMENT:  1. Stage II cancer the right breast, grade 1, without LV I. ER +96%, PR 4%, Ki-67 marker low at 19%, HER-2/neu nonamplified. Oncotype score was 14. He was placed on tamoxifen 10 mg twice a day which he will take for 5 full years starting 03/08/2011. He is status post right mastectomy in April 2012 at which time he was found to have a 2.1 cm primary, 3 sentinel nodes were negative  2. Hodgkin's disease in 1983. Radiation therapy with a complete remission at that time.  3. Right carotid artery occlusion with small stroke in 2007 and he is on Coumadin uptake in the tamoxifen.  4. Left carotid artery partial occlusion followed on a regular basis by his vascular surgeon  5. Obesity weighing 249-252 pounds now.  6. Deconditioning  7. History of Adie's pupil  8. Nodular density in right lower lobe, stable and no need for further follow-up at this time with last CT of chest performed on 08/06/2013. 9. Overweight  Patient Active Problem List   Diagnosis Date Noted  . Occlusion and stenosis of carotid artery without mention of cerebral infarction 11/12/2013  . Coronary atherosclerosis of native coronary artery - multivessel CAD, status post CABG x3 (LIMA-LAD, Left Radial-RI, SVG-RCA) 08/02/2013  . Fatigue 08/02/2013  . Dizzy spells 07/14/2013  . Orthostatic hypotension 07/14/2013  . Anemia associated with acute blood loss, post op and with chronic disease 07/14/2013  . Obesity (BMI 30-39.9) 05/05/2013  . CAD in native artery - status post CABG x3 (LIMA-LAD, Left Radial-Ramus Intermedius, SVG-RCA 05/05/2013    Class: Diagnosis of  . Chronic anticoagulation- Coumadin 04/10/2013  . S/P CABG x 3  LIMA-LAD, free LRA to RI, SVG-RCA  - 04/09/13 04/09/2013  . Angina, class III - cath 04/07/13- 3V CAD 04/02/2013    Class: Hospitalized for  . Heart palpitations 04/02/2013  . Claudication of calf muscles 04/02/2013  . Hypercholesterolemia   . Invasive ductal carcinoma of  right breast, stage 2- 2012 s/p chemo and mastectomy 04/27/2011  . Hodgkin's [id number] 04/27/2011  . Known RICA occlusion (CVA '07) partial LICA stenosis (50-93%) Jan 2014 04/27/2011  . Occlusion and stenosis of carotid artery with cerebral infarction; Known RICA occlusion (CVA '07), partial LICA stenosis (26-71%) 08/02/2006     PLAN:  1. I personally reviewed and went over laboratory results with the patient.  2. I personally reviewed and went over radiographic studies with the patient.  3. Continue same dose of Coumadin as directed  4. INR as directed  5. Change Tamoxifen to 20 mg daily (compared to Tamoxifen 10 mg BID) as prescribed x 5 years (starting on 03/08/2011) since he is taking both tablets at the same time in the evening. 6. Next screening mammogram is due in October 2015. 7. Recommend weight loss  8.  Labs in 6 months: CBC diff, CMET 9. Return in 6 months for follow-up   THERAPY PLAN:  He is to continue with Tamoxifen daily for 5 years.  NCCN guidelines recommends the following surveillance for invasive breast cancer:  A. History and Physical exam every 4-6 months for 5 years and then every 12 months.  B. Mammography every 12 months  C. Women on Tamoxifen: annual gynecologic assessment every 12 months if uterus is present.  D. Women on aromatase inhibitor or who experience ovarian failure secondary to treatment should have monitoring of bone health with a bone mineral density determination at baseline and periodically thereafter.  E. Assess and encourage adherence to adjuvant endocrine therapy.  F. Evidence suggests that active lifestyle and achieving and maintaining an ideal body weight (20-25 BMI) may lead to optimal breast cancer outcomes.   All questions were answered. The patient knows to call the clinic with any problems, questions or concerns. We can certainly see the patient much sooner if necessary.  Patient and plan discussed with Dr. Farrel Gobble and he is  in agreement with the aforementioned.   [facility] 01/29/2014

## 2014-01-29 ENCOUNTER — Other Ambulatory Visit (HOSPITAL_COMMUNITY): Payer: Self-pay | Admitting: Oncology

## 2014-01-29 ENCOUNTER — Encounter (HOSPITAL_COMMUNITY): Payer: Self-pay | Admitting: Oncology

## 2014-01-29 ENCOUNTER — Encounter (HOSPITAL_BASED_OUTPATIENT_CLINIC_OR_DEPARTMENT_OTHER): Payer: BC Managed Care – PPO | Admitting: Oncology

## 2014-01-29 VITALS — BP 99/69 | HR 84 | Temp 97.5°F | Resp 18 | Wt 247.5 lb

## 2014-01-29 DIAGNOSIS — C8190 Hodgkin lymphoma, unspecified, unspecified site: Secondary | ICD-10-CM

## 2014-01-29 DIAGNOSIS — C50919 Malignant neoplasm of unspecified site of unspecified female breast: Secondary | ICD-10-CM

## 2014-01-29 DIAGNOSIS — Z9010 Acquired absence of unspecified breast and nipple: Secondary | ICD-10-CM

## 2014-01-29 DIAGNOSIS — C50929 Malignant neoplasm of unspecified site of unspecified male breast: Secondary | ICD-10-CM

## 2014-01-29 DIAGNOSIS — Z79810 Long term (current) use of selective estrogen receptor modulators (SERMs): Secondary | ICD-10-CM

## 2014-01-29 DIAGNOSIS — Z8673 Personal history of transient ischemic attack (TIA), and cerebral infarction without residual deficits: Secondary | ICD-10-CM

## 2014-01-29 DIAGNOSIS — Z7901 Long term (current) use of anticoagulants: Secondary | ICD-10-CM

## 2014-01-29 DIAGNOSIS — Z8571 Personal history of Hodgkin lymphoma: Secondary | ICD-10-CM

## 2014-01-29 DIAGNOSIS — Z170 Estrogen receptor positive status [ER+]: Secondary | ICD-10-CM

## 2014-01-29 MED ORDER — TAMOXIFEN CITRATE 20 MG PO TABS: 20.0000 mg | ORAL_TABLET | Freq: Every day | ORAL | Status: DC

## 2014-01-29 NOTE — Patient Instructions (Signed)
[facility] Discharge Instructions  RECOMMENDATIONS MADE BY THE CONSULTANT AND ANY TEST RESULTS WILL BE SENT TO YOUR REFERRING PHYSICIAN.  We will see you in 6 months for a doctor visit and labs. Mammogram in October before your doctor's appointment. Continue with PT/INR's  Thank you for choosing [facility] to provide your oncology and hematology care.  To afford each patient quality time with our providers, please arrive at least 15 minutes before your scheduled appointment time.  With your help, our goal is to use those 15 minutes to complete the necessary work-up to ensure our physicians have the information they need to help with your evaluation and healthcare recommendations.    Effective January 1st, 2014, we ask that you re-schedule your appointment with our physicians should you arrive 10 or more minutes late for your appointment.  We strive to give you quality time with our providers, and arriving late affects you and other patients whose appointments are after yours.    Again, thank you for choosing [facility].  Our hope is that these requests will decrease the amount of time that you wait before being seen by our physicians.       _____________________________________________________________  Should you have questions after your visit to [facility], please contact our office at [phone] between the hours of 8:30 a.m. and 5:00 p.m.  Voicemails left after 4:30 p.m. will not be returned until the following business day.  For prescription refill requests, have your pharmacy contact our office with your prescription refill request.

## 2014-02-18 ENCOUNTER — Encounter (HOSPITAL_COMMUNITY): Payer: BC Managed Care – PPO | Attending: Internal Medicine

## 2014-02-18 DIAGNOSIS — Z7901 Long term (current) use of anticoagulants: Secondary | ICD-10-CM | POA: Insufficient documentation

## 2014-02-18 LAB — PROTIME-INR
INR: 2.48 — AB (ref 0.00–1.49)
Prothrombin Time: 26 seconds — ABNORMAL HIGH (ref 11.6–15.2)

## 2014-02-19 ENCOUNTER — Other Ambulatory Visit (HOSPITAL_COMMUNITY): Payer: BC Managed Care – PPO

## 2014-03-07 ENCOUNTER — Other Ambulatory Visit: Payer: Self-pay | Admitting: Cardiology

## 2014-03-09 NOTE — Telephone Encounter (Signed)
Rx refill sent to patient pharmacy   

## 2014-03-18 ENCOUNTER — Encounter (HOSPITAL_COMMUNITY): Payer: BC Managed Care – PPO | Attending: Internal Medicine

## 2014-03-18 DIAGNOSIS — Z8673 Personal history of transient ischemic attack (TIA), and cerebral infarction without residual deficits: Secondary | ICD-10-CM

## 2014-03-18 DIAGNOSIS — Z7901 Long term (current) use of anticoagulants: Secondary | ICD-10-CM

## 2014-03-18 LAB — PROTIME-INR
INR: 2.39 — AB (ref 0.00–1.49)
Prothrombin Time: 25.3 seconds — ABNORMAL HIGH (ref 11.6–15.2)

## 2014-03-18 NOTE — Progress Notes (Signed)
Labs drawn for pt-inr.

## 2014-04-29 ENCOUNTER — Encounter (HOSPITAL_COMMUNITY): Payer: BC Managed Care – PPO | Attending: Internal Medicine

## 2014-04-29 DIAGNOSIS — Z7901 Long term (current) use of anticoagulants: Secondary | ICD-10-CM | POA: Insufficient documentation

## 2014-04-29 DIAGNOSIS — I2510 Atherosclerotic heart disease of native coronary artery without angina pectoris: Secondary | ICD-10-CM

## 2014-04-29 LAB — PROTIME-INR
INR: 2.57 — ABNORMAL HIGH (ref 0.00–1.49)
PROTHROMBIN TIME: 27.6 s — AB (ref 11.6–15.2)

## 2014-04-29 NOTE — Progress Notes (Signed)
LABS DRAWN FOR PT

## 2014-05-08 ENCOUNTER — Other Ambulatory Visit (HOSPITAL_COMMUNITY): Payer: Self-pay | Admitting: Oncology

## 2014-05-08 DIAGNOSIS — I6521 Occlusion and stenosis of right carotid artery: Secondary | ICD-10-CM

## 2014-05-08 DIAGNOSIS — I739 Peripheral vascular disease, unspecified: Secondary | ICD-10-CM

## 2014-05-08 DIAGNOSIS — I2510 Atherosclerotic heart disease of native coronary artery without angina pectoris: Secondary | ICD-10-CM

## 2014-05-08 MED ORDER — WARFARIN SODIUM 4 MG PO TABS: 4.0000 mg | ORAL_TABLET | Freq: Every day | ORAL | Status: DC

## 2014-06-10 ENCOUNTER — Encounter (HOSPITAL_COMMUNITY): Payer: Medicare Other | Attending: Internal Medicine

## 2014-06-10 DIAGNOSIS — Z7901 Long term (current) use of anticoagulants: Secondary | ICD-10-CM | POA: Insufficient documentation

## 2014-06-10 DIAGNOSIS — I2510 Atherosclerotic heart disease of native coronary artery without angina pectoris: Secondary | ICD-10-CM | POA: Diagnosis not present

## 2014-06-10 LAB — PROTIME-INR
INR: 3.47 — ABNORMAL HIGH (ref 0.00–1.49)
Prothrombin Time: 34.9 seconds — ABNORMAL HIGH (ref 11.6–15.2)

## 2014-06-10 NOTE — Progress Notes (Signed)
Labs drawn for pt

## 2014-06-12 ENCOUNTER — Telehealth (HOSPITAL_COMMUNITY): Payer: Self-pay

## 2014-06-12 ENCOUNTER — Other Ambulatory Visit (HOSPITAL_COMMUNITY): Payer: Self-pay

## 2014-06-12 NOTE — Telephone Encounter (Signed)
Call from [facility] wanting to know what to do about husband's coumadin as level was drawn on 06/10/14.  INR was 3.47 and patient has been taking 4 mg daily.  Has not had any changes in medication or diet.  Instructed to hold coumadin for 2 days and to return for repeat INR on 06/16/14.  Verbalized understanding of instructions.

## 2014-06-16 ENCOUNTER — Encounter (HOSPITAL_BASED_OUTPATIENT_CLINIC_OR_DEPARTMENT_OTHER): Payer: Medicare Other

## 2014-06-16 DIAGNOSIS — Z7901 Long term (current) use of anticoagulants: Secondary | ICD-10-CM

## 2014-06-16 LAB — PROTIME-INR
INR: 1.93 — ABNORMAL HIGH (ref 0.00–1.49)
Prothrombin Time: 22.1 seconds — ABNORMAL HIGH (ref 11.6–15.2)

## 2014-06-16 NOTE — Progress Notes (Signed)
Ryan [name] presented for labwork. Labs per MD order drawn via Peripheral Line 22 gauge needle inserted in right antecubital.  Good blood return present. Procedure without incident.  Needle removed intact. Patient tolerated procedure well.  Current Coumadin dose 4 mg daily.

## 2014-06-16 NOTE — Telephone Encounter (Signed)
Noted  

## 2014-06-22 DIAGNOSIS — M542 Cervicalgia: Secondary | ICD-10-CM | POA: Diagnosis not present

## 2014-06-22 DIAGNOSIS — Z6831 Body mass index (BMI) 31.0-31.9, adult: Secondary | ICD-10-CM | POA: Diagnosis not present

## 2014-06-26 ENCOUNTER — Other Ambulatory Visit (HOSPITAL_COMMUNITY): Payer: Self-pay | Admitting: Oncology

## 2014-06-26 ENCOUNTER — Encounter (HOSPITAL_BASED_OUTPATIENT_CLINIC_OR_DEPARTMENT_OTHER): Payer: Medicare Other

## 2014-06-26 DIAGNOSIS — Z7901 Long term (current) use of anticoagulants: Secondary | ICD-10-CM

## 2014-06-26 DIAGNOSIS — C50929 Malignant neoplasm of unspecified site of unspecified male breast: Secondary | ICD-10-CM

## 2014-06-26 LAB — PROTIME-INR
INR: 3.18 — ABNORMAL HIGH (ref 0.00–1.49)
PROTHROMBIN TIME: 32.6 s — AB (ref 11.6–15.2)

## 2014-06-26 NOTE — Progress Notes (Signed)
LABS FOR PT.

## 2014-07-03 ENCOUNTER — Telehealth (HOSPITAL_COMMUNITY): Payer: Self-pay | Admitting: *Deleted

## 2014-07-03 ENCOUNTER — Encounter (HOSPITAL_BASED_OUTPATIENT_CLINIC_OR_DEPARTMENT_OTHER): Payer: Medicare Other

## 2014-07-03 DIAGNOSIS — Z7901 Long term (current) use of anticoagulants: Secondary | ICD-10-CM | POA: Diagnosis not present

## 2014-07-03 DIAGNOSIS — C50929 Malignant neoplasm of unspecified site of unspecified male breast: Secondary | ICD-10-CM | POA: Diagnosis not present

## 2014-07-03 LAB — PROTIME-INR
INR: 3.37 — AB (ref 0.00–1.49)
Prothrombin Time: 34.1 seconds — ABNORMAL HIGH (ref 11.6–15.2)

## 2014-07-03 NOTE — Telephone Encounter (Signed)
Patient had PT INR today, please advise

## 2014-07-03 NOTE — Progress Notes (Signed)
LABS FOR PT

## 2014-07-03 NOTE — Telephone Encounter (Signed)
Decreased warfarin to 3 mg daily and repeat INR in 1 week. Message left on mobile and home number with instructions to confirm that they received message.

## 2014-07-06 ENCOUNTER — Telehealth (HOSPITAL_COMMUNITY): Payer: Self-pay | Admitting: *Deleted

## 2014-07-06 ENCOUNTER — Other Ambulatory Visit (HOSPITAL_COMMUNITY): Payer: Self-pay | Admitting: Hematology and Oncology

## 2014-07-06 MED ORDER — WARFARIN SODIUM 1 MG PO TABS: ORAL_TABLET | ORAL | Status: DC

## 2014-07-06 NOTE — Telephone Encounter (Signed)
Patient's coumadin was changed to 3 mg daily. He only has 4 mg tabs and they are not scored. She got our message on Friday, but when she called back we were closed. They have cut the un scored 4 mg tabs over the weekend to come up with approximately 3 mg. He needs 1 mg tabs called in to [facility]. He will be returning on 10/2 for pt/inr.

## 2014-07-08 DIAGNOSIS — Z23 Encounter for immunization: Secondary | ICD-10-CM | POA: Diagnosis not present

## 2014-07-10 ENCOUNTER — Other Ambulatory Visit (HOSPITAL_COMMUNITY): Payer: 59

## 2014-07-27 ENCOUNTER — Ambulatory Visit (INDEPENDENT_AMBULATORY_CARE_PROVIDER_SITE_OTHER): Payer: Medicare Other | Admitting: Cardiology

## 2014-07-27 ENCOUNTER — Encounter: Payer: Self-pay | Admitting: Cardiology

## 2014-07-27 VITALS — BP 140/82 | HR 94 | Ht 75.0 in | Wt 255.3 lb

## 2014-07-27 DIAGNOSIS — I10 Essential (primary) hypertension: Secondary | ICD-10-CM | POA: Insufficient documentation

## 2014-07-27 DIAGNOSIS — I2510 Atherosclerotic heart disease of native coronary artery without angina pectoris: Secondary | ICD-10-CM

## 2014-07-27 DIAGNOSIS — I6529 Occlusion and stenosis of unspecified carotid artery: Secondary | ICD-10-CM

## 2014-07-27 DIAGNOSIS — E785 Hyperlipidemia, unspecified: Secondary | ICD-10-CM

## 2014-07-27 DIAGNOSIS — I209 Angina pectoris, unspecified: Secondary | ICD-10-CM | POA: Diagnosis not present

## 2014-07-27 DIAGNOSIS — I25118 Atherosclerotic heart disease of native coronary artery with other forms of angina pectoris: Secondary | ICD-10-CM

## 2014-07-27 DIAGNOSIS — R0609 Other forms of dyspnea: Secondary | ICD-10-CM | POA: Insufficient documentation

## 2014-07-27 DIAGNOSIS — Z951 Presence of aortocoronary bypass graft: Secondary | ICD-10-CM | POA: Diagnosis not present

## 2014-07-27 DIAGNOSIS — E669 Obesity, unspecified: Secondary | ICD-10-CM

## 2014-07-27 NOTE — Assessment & Plan Note (Addendum)
Currently on simvastatin with the increased dose of 80 mg simvastatin according to his most recent labs per PCP. Unfortunately did not have them.

## 2014-07-27 NOTE — Assessment & Plan Note (Signed)
Current exertional dyspnea symptoms now one year plus post CABG. Am not sure this is simply just related to his 25 pound weight gain versus potentially he could have some potentially structural cardiac issues or ventricular pericarditis. Also potentially have vein graft disease. He is on a beta blocker and reduced rate to allow for less dizziness. With his current blood pressure he would have room for an ACE inhibitor or ARB if his symptoms could be considered consistent with diastolic dysfunction.  Plan: 2-D Echocardiogram and Treadmill Myoview

## 2014-07-27 NOTE — Patient Instructions (Signed)
Your physician recommends that you schedule a follow-up appointment in: one month with Dr. Ellyn Hack  We are ordering an Echo and a stress test

## 2014-07-27 NOTE — Assessment & Plan Note (Deleted)
He is on aspirin beta blocker and statin.  Evaluating for ischemic likely graft disease with Myoview.

## 2014-07-27 NOTE — Assessment & Plan Note (Signed)
He is on aspirin beta blocker and statin.  Evaluating for ischemic likely graft disease with Myoview.

## 2014-07-27 NOTE — Progress Notes (Signed)
PCP: [name], MD  Clinic Note: Chief Complaint  Patient presents with  . Follow-up    1 yr; SOB all the time, no other complaints   HPI: Ryan [name] is a 63 y.o. male with a PMH below who presents today for essentially one year followup for CAD status post CABG. I last saw him in October of last year and he was doing relatively well several months postoperatively. I first met him in June of 2014 for exertional dyspnea that was associated with mild discomfort. At that time I referred for cardiac catheterization for symptoms of last 3-4 angina there were 3 unstable/crescendo nature.  He was found to have multivessel CAD and referred for CABG.  He has exertional dyspnea, and last visit I backed off on the beta blocker dose for possible chronotropic incompetence & orthostatic dizziness. At that time he was finally finishing up his cardiac rehabilitation. Since then he has not had any routine exercise and has gained ~ 23 pounds.  He notes increasing abdominal girth.  Past Medical History  Diagnosis Date  . Invasive ductal carcinoma of breast, stage 2 04/27/2011    chemo, mastectomy  . Right carotid artery occlusion 04/27/2011  . Left carotid artery partial occlusion 04/27/2011    CAROTIC DOPPLER 11/2013: < 40% R ICA stenosis, distal waveforms damped - suggests probably intracranial occlusoin, < 63% LICA, normal vertebrals -- no change  . Stroke     2007  . Hypercholesterolemia   . Adie's pupil   . H/O Hodgkin's disease 77  . Thyroid disease   . S/P CABG x 3 04/09/2013    LIMA-LAD, fLRAD-RI, SVG-RCA  . CAD (coronary artery disease), native coronary artery 04/09/2013    Dist LM stenosis 70-80% (at trifurcation into LAD, CX, & 2 Ramus branches, 70% mid RCA)  . Essential hypertension 07/27/2014    Prior Cardiac Evaluation and Past Surgical History: Past Surgical History  Procedure Laterality Date  . Mastectomy  rt  . Lymph node biopsy      left neck  . Coronary artery bypass  graft [name] 04/08/2013    Procedure: CORONARY ARTERY BYPASS GRAFTING (CABG);  Surgeon: Ivin Poot, MD;  Location: [facility];  Service: Open Heart Surgery;  Laterality: [name];  Times 3 using left internal mammary artery; endoscopically harvested right saphenous vein and left radial artery  . Intraoperative transesophageal echocardiogram [name] 04/08/2013    Procedure: INTRAOPERATIVE TRANSESOPHAGEAL ECHOCARDIOGRAM;  Surgeon: Ivin Poot, MD;  Location: [facility];  Service: Open Heart Surgery;  Laterality: [name];  . Radial artery harvest Left 04/08/2013    Procedure: RADIAL ARTERY HARVEST;  Surgeon: Ivin Poot, MD;  Location: [facility];  Service: Open Heart Surgery;  Laterality: Left;    Interval History: Today, and on first blush, he says he is doing fine. However he is noting persistent exertional dyspnea to the point where he is hardly able to even walk the dog around the yard. He really noticed it most when he gets out of shower and just exhausted and has to sit down to rest. He has no resting dyspnea He denies any chest pressure with exertion or rest. No PND orthopnea or edema. Occasional blood clots but no real significant palpitations or arrhythmia-type symptoms. No palpitations, lightheadedness, dizziness, weakness or syncope/near syncope. No TIA/amaurosis fugax symptoms.   ROS: A comprehensive was performed. Review of Systems  Constitutional: Negative for weight loss and malaise/fatigue.       Weight gain Decreased energy but not  really fatigue  HENT: Negative for nosebleeds.   Eyes: Negative for blurred vision.  Respiratory: Positive for shortness of breath. Negative for cough and wheezing.   Cardiovascular: Negative for claudication.  Gastrointestinal: Negative for blood in stool and melena.  Genitourinary: Negative for hematuria.  Neurological: Positive for dizziness. Negative for sensory change, speech change, focal weakness, seizures and loss of consciousness.       Occasional vertigo type  symptoms with turning his head  Endo/Heme/Allergies: Does not bruise/bleed easily.  Psychiatric/Behavioral: Negative for depression. The patient is nervous/anxious.   All other systems reviewed and are negative.   Current Outpatient Prescriptions on File Prior to Visit  Medication Sig Dispense Refill  . ALPRAZolam (XANAX) 1 MG tablet Take 1 mg by mouth at bedtime as needed for anxiety. Take 0.5 mg by mouth at bedtime as needed for anxiety      . aspirin EC 81 MG tablet Take 81 mg by mouth daily.       . citalopram (CELEXA) 40 MG tablet Take 40 mg by mouth daily.       . Coconut Oil 1000 MG CAPS Take 1,000 mg by mouth daily.      . Garlic 025 MG CAPS Take 500 mg by mouth daily.      . Ginkgo Biloba 60 MG CAPS Take 1 capsule by mouth daily.      Marland Kitchen levothyroxine (SYNTHROID, LEVOTHROID) 50 MCG tablet Take 75 mcg by mouth daily.       . metoprolol tartrate (LOPRESSOR) 25 MG tablet TAKE ONE-HALF TABLET BY MOUTH AT BEDTIME  15 tablet  6  . nitroGLYCERIN (NITROSTAT) 0.4 MG SL tablet Place 1 tablet (0.4 mg total) under the tongue every 5 (five) minutes as needed for chest pain.  25 tablet  3  . Omega-3 Fatty Acids (FISH OIL) 1000 MG CPDR Take 1 each by mouth daily.      . simvastatin (ZOCOR) 40 MG tablet Take 40 mg by mouth at bedtime.       . tamoxifen (NOLVADEX) 20 MG tablet Take 1 tablet (20 mg total) by mouth daily.  30 tablet  5  . warfarin (COUMADIN) 1 MG tablet Take as directed.  60 tablet  3  . warfarin (COUMADIN) 4 MG tablet Take 1 tablet (4 mg total) by mouth daily.  30 tablet  6   No current facility-administered medications on file prior to visit.    ALLERGIES REVIEWED IN EPIC -- No change SOCIAL AND FAMILY HISTORY REVIEWED IN EPIC -- No change  Wt Readings from Last 3 Encounters:  07/27/14 255 lb 4.8 oz (115.803 kg)  01/29/14 247 lb 8 oz (112.265 kg)  11/12/13 244 lb 9.6 oz (110.95 kg)  07/2013 -  232 lb  PHYSICAL EXAM BP 140/82  Pulse 94  Ht 6' 3"  (1.905 m)  Wt 255 lb 4.8  oz (115.803 kg)  BMI 31.91 kg/m2 General appearance: alert, cooperative, appears stated age, no distress and mildly obese - mostly truncal; otherwise relatively healthy appearing. Answers questions appropriately. Well-nourished and well-groomed  Neck: no adenopathy, no carotid bruit and no JVD  Lungs: clear to auscultation bilaterally, normal percussion bilaterally and non-labored  Heart: regular rate and rhythm, S1, S2 normal, no murmur, click, rub or gallop; nondisplaced PMI; well healing scars from his CABG.  Abdomen: soft, non-tender; bowel sounds normal; no masses, no organomegaly; no HJR  Extremities: extremities normal, atraumatic, no cyanosis, and edema ; no significant venous stasis changes or rashes. Wrist site for  cath stable.  Pulses: 2+ and symmetric; palpable throughout  Neurologic: Mental status: Alert, oriented, thought content appropriate  Cranial nerves: normal (II-XII grossly intact)     Adult ECG Report  Rate: 94 ;  Rhythm: normal sinus rhythm; LAD (-41), RBBB  Narrative Interpretation: No significant change from last EKG besides increased rate  Recent Labs:  None available. Checked by PCP to    ASSESSMENT / PLAN: Exertional dyspnea Current exertional dyspnea symptoms now one year plus post CABG. Am not sure this is simply just related to his 25 pound weight gain versus potentially he could have some potentially structural cardiac issues or ventricular pericarditis. Also potentially have vein graft disease. He is on a beta blocker and reduced rate to allow for less dizziness. With his current blood pressure he would have room for an ACE inhibitor or ARB if his symptoms could be considered consistent with diastolic dysfunction.  Plan: 2-D Echocardiogram and Treadmill Myoview  S/P CABG x 3  LIMA-LAD, free LRA to RI, SVG-RCA  - 04/09/13 Just about 15 months post CABG without worsening/recurrent exertional dyspnea that was his presenting symptom that led to CABG. Ischemic  and structural evaluation as noted above -- 2-D Echocardiogram and Exercise Myoview  Coronary atherosclerosis of native coronary artery - multivessel CAD, status post CABG x3 (LIMA-LAD, Left Radial-RI, SVG-RCA) He is on aspirin beta blocker and statin.  Evaluating for ischemic likely graft disease with Myoview.  Hyperlipidemia with target LDL less than 70 Currently on simvastatin with the increased dose of 80 mg simvastatin according to his most recent labs per PCP. Unfortunately did not have them.  Essential hypertension His blood pressure little other day only on low dose of metoprolol. May consider adding ARB for additional blood pressure control / afterload reduction.  Obesity (BMI 30-39.9) Unfortunately he's gained a significant amount of weight after her cardiac rehabilitation. This could very well be his cause for dyspnea. If nothing else comes from a stress test or echocardiogram, I would recommend he get back into an exercise program in order to get back into shape.    Orders Placed This Encounter  Procedures  . Myocardial Perfusion Imaging    Standing Status: Future     Number of Occurrences:      Standing Expiration Date: 07/28/2015    Order Specific Question:  Where should this test be performed    Answer:  [facility]    Order Specific Question:  Type of stress    Answer:  Exercise    Order Specific Question:  Patient weight in lbs    Answer:  255  . EKG 12-Lead  . 2D Echocardiogram without contrast    Standing Status: Future     Number of Occurrences:      Standing Expiration Date: 07/28/2015    Scheduling Instructions:     S/p cabg    Order Specific Question:  Type of Echo    Answer:  Complete    Order Specific Question:  Where should this test be performed    Answer:  [facility]    Order Specific Question:  Reason for exam-Echo    Answer:  Other - See Comments Section   No orders of the defined types were placed in this encounter.      Followup: One month   Joeann Steppe W, M.D., M.S. Interventional Cardiologist   Pager # [phone]

## 2014-07-27 NOTE — Assessment & Plan Note (Signed)
Unfortunately he's gained a significant amount of weight after her cardiac rehabilitation. This could very well be his cause for dyspnea. If nothing else comes from a stress test or echocardiogram, I would recommend he get back into an exercise program in order to get back into shape.

## 2014-07-27 NOTE — Assessment & Plan Note (Signed)
His blood pressure little other day only on low dose of metoprolol. May consider adding ARB for additional blood pressure control / afterload reduction.

## 2014-07-27 NOTE — Assessment & Plan Note (Signed)
Just about 15 months post CABG without worsening/recurrent exertional dyspnea that was his presenting symptom that led to CABG. Ischemic and structural evaluation as noted above -- 2-D Echocardiogram and Exercise Myoview

## 2014-07-31 ENCOUNTER — Telehealth: Payer: Self-pay | Admitting: Genetic Counselor

## 2014-07-31 ENCOUNTER — Encounter (HOSPITAL_BASED_OUTPATIENT_CLINIC_OR_DEPARTMENT_OTHER): Payer: Medicare Other

## 2014-07-31 ENCOUNTER — Encounter (HOSPITAL_COMMUNITY): Payer: Medicare Other | Attending: Internal Medicine

## 2014-07-31 ENCOUNTER — Ambulatory Visit (HOSPITAL_COMMUNITY): Payer: BC Managed Care – PPO | Admitting: Oncology

## 2014-07-31 ENCOUNTER — Encounter (HOSPITAL_COMMUNITY): Payer: Self-pay

## 2014-07-31 ENCOUNTER — Encounter (HOSPITAL_COMMUNITY): Payer: Self-pay | Admitting: Lab

## 2014-07-31 VITALS — BP 111/68 | HR 84 | Temp 97.5°F | Resp 18 | Wt 251.1 lb

## 2014-07-31 DIAGNOSIS — C50911 Malignant neoplasm of unspecified site of right female breast: Secondary | ICD-10-CM | POA: Diagnosis not present

## 2014-07-31 DIAGNOSIS — C50919 Malignant neoplasm of unspecified site of unspecified female breast: Secondary | ICD-10-CM | POA: Diagnosis not present

## 2014-07-31 DIAGNOSIS — Z170 Estrogen receptor positive status [ER+]: Secondary | ICD-10-CM

## 2014-07-31 DIAGNOSIS — Z7901 Long term (current) use of anticoagulants: Secondary | ICD-10-CM

## 2014-07-31 DIAGNOSIS — Z951 Presence of aortocoronary bypass graft: Secondary | ICD-10-CM

## 2014-07-31 DIAGNOSIS — I6521 Occlusion and stenosis of right carotid artery: Secondary | ICD-10-CM

## 2014-07-31 DIAGNOSIS — C50921 Malignant neoplasm of unspecified site of right male breast: Secondary | ICD-10-CM

## 2014-07-31 DIAGNOSIS — Z79810 Long term (current) use of selective estrogen receptor modulators (SERMs): Secondary | ICD-10-CM

## 2014-07-31 DIAGNOSIS — C8190 Hodgkin lymphoma, unspecified, unspecified site: Secondary | ICD-10-CM | POA: Insufficient documentation

## 2014-07-31 LAB — COMPREHENSIVE METABOLIC PANEL
ALT: 28 U/L (ref 0–53)
AST: 26 U/L (ref 0–37)
Albumin: 3.6 g/dL (ref 3.5–5.2)
Alkaline Phosphatase: 50 U/L (ref 39–117)
Anion gap: 13 (ref 5–15)
BILIRUBIN TOTAL: 0.5 mg/dL (ref 0.3–1.2)
BUN: 18 mg/dL (ref 6–23)
CHLORIDE: 100 meq/L (ref 96–112)
CO2: 26 meq/L (ref 19–32)
CREATININE: 1.54 mg/dL — AB (ref 0.50–1.35)
Calcium: 9 mg/dL (ref 8.4–10.5)
GFR calc Af Amer: 53 mL/min — ABNORMAL LOW (ref >90)
GFR calc non Af Amer: 46 mL/min — ABNORMAL LOW (ref >90)
Glucose, Bld: 180 mg/dL — ABNORMAL HIGH (ref 70–99)
Potassium: 4.5 mEq/L (ref 3.7–5.3)
Sodium: 139 mEq/L (ref 137–147)
Total Protein: 6.9 g/dL (ref 6.0–8.3)

## 2014-07-31 LAB — CBC WITH DIFFERENTIAL/PLATELET
Basophils Absolute: 0.1 10*3/uL (ref 0.0–0.1)
Basophils Relative: 1 % (ref 0–1)
Eosinophils Absolute: 0.3 10*3/uL (ref 0.0–0.7)
Eosinophils Relative: 3 % (ref 0–5)
HEMATOCRIT: 40.4 % (ref 39.0–52.0)
HEMOGLOBIN: 13.2 g/dL (ref 13.0–17.0)
LYMPHS PCT: 27 % (ref 12–46)
Lymphs Abs: 2.3 10*3/uL (ref 0.7–4.0)
MCH: 29.9 pg (ref 26.0–34.0)
MCHC: 32.7 g/dL (ref 30.0–36.0)
MCV: 91.6 fL (ref 78.0–100.0)
MONO ABS: 0.6 10*3/uL (ref 0.1–1.0)
MONOS PCT: 7 % (ref 3–12)
Neutro Abs: 5.2 10*3/uL (ref 1.7–7.7)
Neutrophils Relative %: 62 % (ref 43–77)
Platelets: 268 10*3/uL (ref 150–400)
RBC: 4.41 MIL/uL (ref 4.22–5.81)
RDW: 14.5 % (ref 11.5–15.5)
WBC: 8.4 10*3/uL (ref 4.0–10.5)

## 2014-07-31 LAB — PROTIME-INR
INR: 1.6 — ABNORMAL HIGH (ref 0.00–1.49)
PROTHROMBIN TIME: 19.2 s — AB (ref 11.6–15.2)

## 2014-07-31 LAB — LACTATE DEHYDROGENASE: LDH: 201 U/L (ref 94–250)

## 2014-07-31 NOTE — Patient Instructions (Addendum)
[facility] Discharge Instructions  RECOMMENDATIONS MADE BY THE CONSULTANT AND ANY TEST RESULTS WILL BE SENT TO YOUR REFERRING PHYSICIAN.  EXAM FINDINGS BY THE PHYSICIAN TODAY AND SIGNS OR SYMPTOMS TO REPORT TO CLINIC OR PRIMARY PHYSICIAN: Exam and findings as discussed by Dr. Barnet Glasgow.  Will do Star Assessment today due to your fatigue and shortness of breath.  Mammogram as scheduled.  Will make a referral for Genetic Counseling. They will contact you with appointment information. Report any new lumps, bone pain, shortness of breath or other symptoms.  MEDICATIONS PRESCRIBED:  Continue Tamoxifen Change coumadin to 3 mg alternating with 4 mg.  INSTRUCTIONS/FOLLOW-UP: PT/INR level in 2 weeks. Follow-up in 6 months with labs and office visit.  Thank you for choosing [facility] to provide your oncology and hematology care.  To afford each patient quality time with our providers, please arrive at least 15 minutes before your scheduled appointment time.  With your help, our goal is to use those 15 minutes to complete the necessary work-up to ensure our physicians have the information they need to help with your evaluation and healthcare recommendations.    Effective January 1st, 2014, we ask that you re-schedule your appointment with our physicians should you arrive 10 or more minutes late for your appointment.  We strive to give you quality time with our providers, and arriving late affects you and other patients whose appointments are after yours.    Again, thank you for choosing [facility].  Our hope is that these requests will decrease the amount of time that you wait before being seen by our physicians.       _____________________________________________________________  Should you have questions after your visit to [facility], please contact our office at [phone] between the hours of 8:30 a.m. and 4:30 p.m.  Voicemails  left after 4:30 p.m. will not be returned until the following business day.  For prescription refill requests, have your pharmacy contact our office with your prescription refill request.    _______________________________________________________________  We hope that we have given you very good care.  You may receive a patient satisfaction survey in the mail, please complete it and return it as soon as possible.  We value your feedback!  _______________________________________________________________  Have you asked about our STAR program?  STAR stands for Survivorship Training and Rehabilitation, and this is a nationally recognized cancer care program that focuses on survivorship and rehabilitation.  Cancer and cancer treatments may cause problems, such as, pain, making you feel tired and keeping you from doing the things that you need or want to do. Cancer rehabilitation can help. Our goal is to reduce these troubling effects and help you have the best quality of life possible.  You may receive a survey from a nurse that asks questions about your current state of health.  Based on the survey results, all eligible patients will be referred to the [facility] program for an evaluation so we can better serve you!  A frequently asked questions sheet is available upon request.

## 2014-07-31 NOTE — Progress Notes (Signed)
[facility]  OFFICE PROGRESS NOTE  [name], MD [place] [address]  DIAGNOSIS: Invasive ductal carcinoma of breast, stage 2, right - Plan: CEA, Cancer antigen 27.29, Lactate dehydrogenase, Beta 2 microglobuline, serum, Protime-INR, Beta 2 microglobuline, serum, Hawthorne Berry 550 MG CAPS, Consult to genetics, Ambulatory referral to Physical Therapy, CBC with Differential, Comprehensive metabolic panel, CEA, Cancer antigen 27.29  Hodgkin's disease - Plan: CEA, Cancer antigen 27.29, Lactate dehydrogenase, Beta 2 microglobuline, serum, Protime-INR, Beta 2 microglobuline, serum, Hawthorne Berry 550 MG CAPS, Consult to genetics, Ambulatory referral to Physical Therapy, CBC with Differential, Comprehensive metabolic panel, CEA, Cancer antigen 27.29  Chronic anticoagulation - Plan: CEA, Cancer antigen 27.29, Lactate dehydrogenase, Beta 2 microglobuline, serum, Protime-INR, Beta 2 microglobuline, serum, Hawthorne Berry 550 MG CAPS, Consult to genetics, Ambulatory referral to Physical Therapy, CBC with Differential, Comprehensive metabolic panel, CEA, Cancer antigen 27.29  S/P CABG x 3  LIMA-LAD, free LRA to RI, SVG-RCA  - 04/09/13 - Plan: CEA, Cancer antigen 27.29, Lactate dehydrogenase, Beta 2 microglobuline, serum, Protime-INR, Beta 2 microglobuline, serum, Hawthorne Berry 550 MG CAPS, Consult to genetics, Ambulatory referral to Physical Therapy, CBC with Differential, Comprehensive metabolic panel, CEA, Cancer antigen 27.29  Chief Complaint  Patient presents with  . Breast Cancer    CURRENT THERAPY: Male breast cancer on Tamoxifen 20 mg daily started 03/08/2011.  INTERVAL HISTORY: Ryan [name] 63 y.o. male returns for followup of Stage II cancer the right breast, grade 1, without LV I. ER +96%, PR 4%, Ki-67 marker low at 19%, HER-2/neu nonamplified. Oncotype score was 14. He was placed on tamoxifen 10 mg twice a day  which he will take for 5 full years starting 03/08/2011. He is status post right mastectomy in April 2012 at which time he was found to have a 2.1 cm primary, 3 sentinel nodes were negative. He is more short of breath of late and is scheduled for a stress test and echocardiogram. He did go through cardiac rehabilitation after undergoing CABG 3 years ago but after completion of the intensive rehabilitation, he has remained relatively sedentary. He denies any abnormalities on so present examination. Also denies any PND, orthopnea, palpitations, lower extremity swelling or redness, hot flashes, diarrhea, constipation, nausea, vomiting, skin rash, headache, or seizures.   MEDICAL HISTORY: Past Medical History  Diagnosis Date  . Invasive ductal carcinoma of breast, stage 2 04/27/2011    chemo, mastectomy  . Right carotid artery occlusion 04/27/2011  . Left carotid artery partial occlusion 04/27/2011    CAROTIC DOPPLER 11/2013: < 40% R ICA stenosis, distal waveforms damped - suggests probably intracranial occlusoin, < 46% LICA, normal vertebrals -- no change  . Stroke     2007  . Hypercholesterolemia   . Adie's pupil   . H/O Hodgkin's disease 27  . Thyroid disease   . S/P CABG x 3 04/09/2013    LIMA-LAD, fLRAD-RI, SVG-RCA  . CAD (coronary artery disease), native coronary artery 04/09/2013    Dist LM stenosis 70-80% (at trifurcation into LAD, CX, & 2 Ramus branches, 70% mid RCA)  . Essential hypertension 07/27/2014    INTERIM HISTORY: has Invasive ductal carcinoma of right breast, stage 2- 2012 s/p chemo and mastectomy; Hodgkin's [id number]; Known RICA occlusion (CVA '07) partial LICA stenosis (44-01%) Jan 2014; Angina, class III - cath 04/07/13- 3V CAD; Hyperlipidemia with target LDL less than 70; Heart palpitations; Claudication of calf muscles; S/P CABG x 3  LIMA-LAD, free LRA to RI, SVG-RCA  - 04/09/13; Chronic anticoagulation- Coumadin; Obesity (BMI 30-39.9); CAD in native artery - status post CABG x3  (LIMA-LAD, Left Radial-Ramus Intermedius, SVG-RCA; Dizzy spells; Orthostatic hypotension; Anemia associated with acute blood loss, post op and with chronic disease; Coronary atherosclerosis of native coronary artery - multivessel CAD, status post CABG x3 (LIMA-LAD, Left Radial-RI, SVG-RCA); Occlusion and stenosis of carotid artery with cerebral infarction; Known RICA occlusion (CVA '07), partial LICA stenosis (16-10%); Fatigue; Occlusion and stenosis of carotid artery without mention of cerebral infarction; Exertional dyspnea; and Essential hypertension on his problem list.   Invasive ductal carcinoma of right breast, stage 2- 2012 s/p chemo and mastectomy  12/14/2010  Initial Diagnosis  Right needle core biopsy at 9 o'clock position positive for invasive ductal carcinoma  01/17/2011  Surgery  Right simple mastectomy showing a 2.1 cm invasive ductal carcinoma, clear marhins, 0/2 lymph nodes. ER 96%, PR 84%, Ki-67 19%, Her2 negative.  03/08/2011 -  Chemotherapy  Tamoxifen x 5 years  Hodgkin's disease-1983  04/27/2011  Initial Diagnosis  Hodgkin's [id number]   ALLERGIES:  has No Known Allergies.  MEDICATIONS: has a current medication list which includes the following prescription(s): alprazolam, aspirin ec, citalopram, coconut oil, garlic, ginkgo biloba, hawthorne berry, levothyroxine, metoprolol tartrate, nitroglycerin, fish oil, simvastatin, tamoxifen, warfarin, and warfarin.  SURGICAL HISTORY:  Past Surgical History  Procedure Laterality Date  . Mastectomy  rt  . Lymph node biopsy      left neck  . Coronary artery bypass graft [name] 04/08/2013    Procedure: CORONARY ARTERY BYPASS GRAFTING (CABG);  Surgeon: Ivin Poot, MD;  Location: [facility];  Service: Open Heart Surgery;  Laterality: [name];  Times 3 using left internal mammary artery; endoscopically harvested right saphenous vein and left radial artery  . Intraoperative transesophageal echocardiogram [name] 04/08/2013    Procedure: INTRAOPERATIVE  TRANSESOPHAGEAL ECHOCARDIOGRAM;  Surgeon: Ivin Poot, MD;  Location: [facility];  Service: Open Heart Surgery;  Laterality: [name];  . Radial artery harvest Left 04/08/2013    Procedure: RADIAL ARTERY HARVEST;  Surgeon: Ivin Poot, MD;  Location: [facility];  Service: Open Heart Surgery;  Laterality: Left;    FAMILY HISTORY: family history includes Alzheimer's disease in his mother; Diabetes in his mother, paternal grandfather, and paternal grandmother; Hyperlipidemia in his mother; Hypertension in his father and mother; Stroke in his mother.  SOCIAL HISTORY:  reports that he quit smoking about 32 years ago. His smoking use included Cigarettes. He smoked 1.00 pack per day. He has never used smokeless tobacco. He reports that he does not drink alcohol or use illicit drugs.  REVIEW OF SYSTEMS:  Other than that discussed above is noncontributory.  PHYSICAL EXAMINATION: ECOG PERFORMANCE STATUS: 1 - Symptomatic but completely ambulatory  Blood pressure 111/68, pulse 84, temperature 97.5 F (36.4 C), temperature source Oral, resp. rate 18, weight 251 lb 1.6 oz (113.898 kg), SpO2 99.00%.  GENERAL:alert, no distress and comfortable SKIN: skin color, texture, turgor are normal, no rashes or significant lesions EYES: PERLA; Conjunctiva are pink and non-injected, sclera clear SINUSES: No redness or tenderness over maxillary or ethmoid sinuses OROPHARYNX:no exudate, no erythema on lips, buccal mucosa, or tongue. NECK: supple, thyroid normal size, non-tender, without nodularity. No masses CHEST: Status post right mastectomy with no subcutaneous nodules. Left breast without mass. Sternotomy scar is well-healed. LYMPH:  no palpable lymphadenopathy in the cervical, axillary or inguinal LUNGS: clear to auscultation and percussion with normal breathing effort HEART: regular rate & rhythm  and no murmurs. ABDOMEN:abdomen soft, non-tender and normal bowel sounds. Obese. No hepatomegaly, ascites, or CVA  tenderness. MUSCULOSKELETAL:no cyanosis of digits and no clubbing. Range of motion normal.  NEURO: alert & oriented x 3 with fluent speech, no focal motor/sensory deficits   LABORATORY DATA: Lab on 07/31/2014  Component Date Value Ref Range Status  . WBC 07/31/2014 8.4  4.0 - 10.5 K/uL Final  . RBC 07/31/2014 4.41  4.22 - 5.81 MIL/uL Final  . Hemoglobin 07/31/2014 13.2  13.0 - 17.0 g/dL Final  . HCT 07/31/2014 40.4  39.0 - 52.0 % Final  . MCV 07/31/2014 91.6  78.0 - 100.0 fL Final  . MCH 07/31/2014 29.9  26.0 - 34.0 pg Final  . MCHC 07/31/2014 32.7  30.0 - 36.0 g/dL Final  . RDW 07/31/2014 14.5  11.5 - 15.5 % Final  . Platelets 07/31/2014 268  150 - 400 K/uL Final  . Neutrophils Relative % 07/31/2014 62  43 - 77 % Final  . Neutro Abs 07/31/2014 5.2  1.7 - 7.7 K/uL Final  . Lymphocytes Relative 07/31/2014 27  12 - 46 % Final  . Lymphs Abs 07/31/2014 2.3  0.7 - 4.0 K/uL Final  . Monocytes Relative 07/31/2014 7  3 - 12 % Final  . Monocytes Absolute 07/31/2014 0.6  0.1 - 1.0 K/uL Final  . Eosinophils Relative 07/31/2014 3  0 - 5 % Final  . Eosinophils Absolute 07/31/2014 0.3  0.0 - 0.7 K/uL Final  . Basophils Relative 07/31/2014 1  0 - 1 % Final  . Basophils Absolute 07/31/2014 0.1  0.0 - 0.1 K/uL Final  . Sodium 07/31/2014 139  137 - 147 mEq/L Final  . Potassium 07/31/2014 4.5  3.7 - 5.3 mEq/L Final  . Chloride 07/31/2014 100  96 - 112 mEq/L Final  . CO2 07/31/2014 26  19 - 32 mEq/L Final  . Glucose, Bld 07/31/2014 180* 70 - 99 mg/dL Final  . BUN 07/31/2014 18  6 - 23 mg/dL Final  . Creatinine, Ser 07/31/2014 1.54* 0.50 - 1.35 mg/dL Final  . Calcium 07/31/2014 9.0  8.4 - 10.5 mg/dL Final  . Total Protein 07/31/2014 6.9  6.0 - 8.3 g/dL Final  . Albumin 07/31/2014 3.6  3.5 - 5.2 g/dL Final  . AST 07/31/2014 26  0 - 37 U/L Final  . ALT 07/31/2014 28  0 - 53 U/L Final  . Alkaline Phosphatase 07/31/2014 50  39 - 117 U/L Final  . Total Bilirubin 07/31/2014 0.5  0.3 - 1.2 mg/dL Final   . GFR calc non Af Amer 07/31/2014 46* >90 mL/min Final  . GFR calc Af Amer 07/31/2014 53* >90 mL/min Final   Comment: (NOTE)                          The eGFR has been calculated using the CKD EPI equation.                          This calculation has not been validated in all clinical situations.                          eGFR's persistently <90 mL/min signify possible Chronic Kidney                          Disease.  . Anion gap 07/31/2014 13  5 -  15 Final  . LDH 07/31/2014 201  94 - 250 U/L Final  . Prothrombin Time 07/31/2014 19.2* 11.6 - 15.2 seconds Final  . INR 07/31/2014 1.60* 0.00 - 1.49 Final  Lab on 07/03/2014  Component Date Value Ref Range Status  . Prothrombin Time 07/03/2014 34.1* 11.6 - 15.2 seconds Final  . INR 07/03/2014 3.37* 0.00 - 1.49 Final    PATHOLOGY: No new pathology.  Urinalysis    Component Value Date/Time   COLORURINE YELLOW 04/07/2013 2347   APPEARANCEUR CLOUDY* 04/07/2013 2347   LABSPEC 1.020 04/07/2013 2347   PHURINE 5.5 04/07/2013 2347   GLUCOSEU NEGATIVE 04/07/2013 2347   HGBUR NEGATIVE 04/07/2013 2347   BILIRUBINUR NEGATIVE 04/07/2013 2347   KETONESUR NEGATIVE 04/07/2013 2347   PROTEINUR NEGATIVE 04/07/2013 2347   UROBILINOGEN 0.2 04/07/2013 2347   NITRITE NEGATIVE 04/07/2013 2347   LEUKOCYTESUR NEGATIVE 04/07/2013 2347    RADIOGRAPHIC STUDIES: No results found.  ASSESSMENT:  1. Stage II cancer the right breast, grade 1, without LV I. ER +96%, PR 4%, Ki-67 marker low at 19%, HER-2/neu nonamplified. Oncotype score was 14. He was placed on tamoxifen 10 mg twice a day which he will take for 5 full years starting 03/08/2011. He is status post right mastectomy in April 2012 at which time he was found to have a 2.1 cm primary, 3 sentinel nodes were negative  2. Hodgkin's disease in 1983. Radiation therapy with a complete remission at that time.  3. Right carotid artery occlusion with small stroke in 2007 and he is on Coumadin uptake in the tamoxifen.  4.  Left carotid artery partial occlusion followed on a regular basis by his vascular surgeon  5. Obesity weighing 249-252 pounds now.  6. Deconditioning  7. History of Adie's pupil  8. Nodular density in right lower lobe, stable and no need for further follow-up at this time with last CT of chest performed on 08/06/2013 9. Increase shortness of breath secondary to deconditioning.    PLAN:  #1. Continue tamoxifen 20 mg daily. #2. Left mammogram scheduled for next week. #3. Refer for genetics counseling and My Risk analysis looking for BRCA and other mutations. Patient does have 2 daughters. #4. Followup in 6 months with CBC, chem profile, CEA, CA 27-29. Follow through with echocardiogram and stress test as ordered by his cardiologist. #5. Increase warfarin to 3 mg alternating with 4 mg daily.  All questions were answered. The patient knows to call the clinic with any problems, questions or concerns. We can certainly see the patient much sooner if necessary.   I spent 25 minutes counseling the patient face to face. The total time spent in the appointment was 30 minutes.    Doroteo Bradford, MD 07/31/2014 10:46 AM  DISCLAIMER:  This note was dictated with voice recognition software.  Similar sounding words can inadvertently be transcribed inaccurately and may not be corrected upon review.

## 2014-07-31 NOTE — Progress Notes (Signed)
LABS FOR CEA,CA2729,B2M,LDH,CBCD,CMP,PT/INR

## 2014-07-31 NOTE — Progress Notes (Signed)
STAR Program Physical Impairment and Functional Assessment Screening Tool  1. Are you having any pain, including headaches, joint pain, or muscle pain (upper body = OT; lower body = PT)?  No  2. Do your hands and/or feet feel numb or tingle (PT)?  No  3. Does any part of your body feel swollen or larger than usual (upper body = OT; lower body = PT)?  No  4. Are you so tired that you cannot do the things you want or need to do (PT or OT)?  Yes, but I hand this before my cancer diagnosis.  5. Are you feeling weak or are you having trouble moving any part of your body (PT/OT)?  No  6. Are you having trouble concentrating, thinking, or remembering things (OT/ST)?  Yes, but I hand this before my cancer diagnosis.  7. Are you having trouble moving around or feel like you might trip or fall (PT)?  No  8. Are you having trouble swallowing (ST)?  No  9. Are you having trouble speaking (ST)?  No  10. Are you having trouble with going or getting to the bathroom (OT)?  No  11. Are you having trouble with your sexual function (OT)?  No  12. Are you having trouble lifting things, even just your arms (OT/PT)?  No  13. Are you having trouble taking care of yourself as in dressing or bathing (OT)?  No  14. Are you having trouble with daily tasks like chores or shopping (OT)?  Yes, this started after my diagnosis and is still a problem.  15. Are you having trouble driving (OT)?  No  16. Are you having trouble returning to work or completing your tasks at work (OT)?  No  Other concerns:  Complains with overall fatigue and dyspnea with activity.  Legend: OT = Occupational Therapy PT = Physical Therapy ST = Speech Therapy

## 2014-07-31 NOTE — Progress Notes (Signed)
Referral faxed to Genetic's referral to [facility] on 10/23

## 2014-07-31 NOTE — Telephone Encounter (Signed)
LEFT MESSAGE FOR PATIENT TO RETURN CALL TO SCHEDULE GENETIC APPT.  °

## 2014-08-01 LAB — CEA: CEA: 2.7 ng/mL (ref 0.0–5.0)

## 2014-08-01 LAB — CANCER ANTIGEN 27.29: CA 27.29: 20 U/mL (ref 0–39)

## 2014-08-03 ENCOUNTER — Telehealth: Payer: Self-pay | Admitting: Genetic Counselor

## 2014-08-03 LAB — BETA 2 MICROGLOBULIN, SERUM: Beta-2 Microglobulin: 3.16 mg/L — ABNORMAL HIGH (ref <=2.51)

## 2014-08-03 NOTE — Telephone Encounter (Signed)
S/W PATIENT AND GAVE GENETIC APPT FOR 11/09 @ 10 Shari Prows POWELL.  REFERRING DR. Farrel Gobble

## 2014-08-04 ENCOUNTER — Telehealth (HOSPITAL_COMMUNITY): Payer: Self-pay

## 2014-08-04 NOTE — Telephone Encounter (Signed)
Encounter complete. 

## 2014-08-05 ENCOUNTER — Telehealth (HOSPITAL_COMMUNITY): Payer: Self-pay

## 2014-08-05 NOTE — Telephone Encounter (Signed)
Encounter complete. 

## 2014-08-06 ENCOUNTER — Ambulatory Visit (HOSPITAL_BASED_OUTPATIENT_CLINIC_OR_DEPARTMENT_OTHER)
Admission: RE | Admit: 2014-08-06 | Discharge: 2014-08-06 | Disposition: A | Discharge status: Home / Self Care | Payer: Medicare Other | Source: Ambulatory Visit | Attending: Cardiology | Admitting: Cardiology

## 2014-08-06 ENCOUNTER — Ambulatory Visit (HOSPITAL_COMMUNITY)
Admission: RE | Admit: 2014-08-06 | Discharge: 2014-08-06 | Disposition: A | Discharge status: Home / Self Care | Payer: Medicare Other | Source: Ambulatory Visit | Attending: Cardiovascular Disease | Admitting: Cardiovascular Disease

## 2014-08-06 DIAGNOSIS — R42 Dizziness and giddiness: Secondary | ICD-10-CM | POA: Diagnosis not present

## 2014-08-06 DIAGNOSIS — E785 Hyperlipidemia, unspecified: Secondary | ICD-10-CM | POA: Insufficient documentation

## 2014-08-06 DIAGNOSIS — I050 Rheumatic mitral stenosis: Secondary | ICD-10-CM

## 2014-08-06 DIAGNOSIS — I739 Peripheral vascular disease, unspecified: Secondary | ICD-10-CM | POA: Diagnosis not present

## 2014-08-06 DIAGNOSIS — Z951 Presence of aortocoronary bypass graft: Secondary | ICD-10-CM

## 2014-08-06 DIAGNOSIS — R0600 Dyspnea, unspecified: Secondary | ICD-10-CM | POA: Insufficient documentation

## 2014-08-06 DIAGNOSIS — I359 Nonrheumatic aortic valve disorder, unspecified: Secondary | ICD-10-CM | POA: Diagnosis not present

## 2014-08-06 DIAGNOSIS — I10 Essential (primary) hypertension: Secondary | ICD-10-CM | POA: Diagnosis not present

## 2014-08-06 DIAGNOSIS — Z8673 Personal history of transient ischemic attack (TIA), and cerebral infarction without residual deficits: Secondary | ICD-10-CM | POA: Diagnosis not present

## 2014-08-06 DIAGNOSIS — E669 Obesity, unspecified: Secondary | ICD-10-CM | POA: Insufficient documentation

## 2014-08-06 DIAGNOSIS — R5383 Other fatigue: Secondary | ICD-10-CM | POA: Insufficient documentation

## 2014-08-06 DIAGNOSIS — I2510 Atherosclerotic heart disease of native coronary artery without angina pectoris: Secondary | ICD-10-CM | POA: Diagnosis not present

## 2014-08-06 DIAGNOSIS — Z87891 Personal history of nicotine dependence: Secondary | ICD-10-CM | POA: Insufficient documentation

## 2014-08-06 HISTORY — PX: OTHER SURGICAL HISTORY: SHX169

## 2014-08-06 HISTORY — PX: TRANSTHORACIC ECHOCARDIOGRAM: SHX275

## 2014-08-06 HISTORY — DX: Rheumatic mitral stenosis: I05.0

## 2014-08-06 MED ORDER — TECHNETIUM TC 99M SESTAMIBI GENERIC - CARDIOLITE
10.8000 | Freq: Once | INTRAVENOUS | Status: AC | PRN
  Administered 2014-08-06: 11 via INTRAVENOUS

## 2014-08-06 MED ORDER — TECHNETIUM TC 99M SESTAMIBI GENERIC - CARDIOLITE
32.1000 | Freq: Once | INTRAVENOUS | Status: AC | PRN
  Administered 2014-08-06: 32.1 via INTRAVENOUS

## 2014-08-06 NOTE — Progress Notes (Signed)
2D Echo Performed 08/06/2014    Marygrace Drought, RCS

## 2014-08-06 NOTE — Procedures (Addendum)
[facility] [address] [place] [address] [phone]  Cardiology Nuclear Med Study  Ryan [name] is a 63 y.o. male     MRN : [id number]     DOB: Jun 01, 1951  Procedure Date: 08/06/2014  Nuclear Med Background Indication for Stress Test:  Graft Patency History:  CAD;CABG X3-04/09/2013;No prior respiratory history reported;No prior NUC MPI for comparison Cardiac Risk Factors: Carotid Disease, CVA, History of Smoking, Hypertension, Lipids, Obesity and PVD  Symptoms:  Dizziness, DOE, Fatigue and Light-Headedness   Nuclear Pre-Procedure Caffeine/Decaff Intake:  12:00am NPO After: 10am   IV Site: L Forearm  IV 0.9% NS with Angio Cath:  22g  Chest Size (in):  52"  IV Started by: Rolene Course, RN  Height: 6\' 3"  (1.905 m)  Cup Size: n/a  BMI:  Body mass index is 31.87 kg/(m^2). Weight:  255 lb (115.667 kg)   Tech Comments:  Pt has history of right mastectomy/lymphectomy.NO STICKS OR BP ON RIGHT SIDE.    Nuclear Med Study 1 or 2 day study: 1 day  Stress Test Type:  Stress  Order Authorizing Provider:  Glenetta Hew, MD   Resting Radionuclide: Technetium 89m Sestamibi  Resting Radionuclide Dose: 10.8 mCi   Stress Radionuclide:  Technetium 68m Sestamibi  Stress Radionuclide Dose: 32.1 mCi           Stress Protocol Rest HR: 115 Stress HR: 139  Rest BP: 164/85 Stress BP: 203/76  Exercise Time (min): 5:07 METS: 6.30   Predicted Max HR: 155 bpm % Max HR: 89.68 bpm Rate Pressure Product: 28217  Dose of Adenosine (mg):  n/a Dose of Lexiscan: n/a mg  Dose of Atropine (mg): n/a Dose of Dobutamine: n/a mcg/kg/min (at max HR)  Stress Test Technologist: Mellody Memos, CCT Nuclear Technologist: Imagene Riches, CNMT   Rest Procedure:  Myocardial perfusion imaging was performed at rest 45 minutes following the intravenous administration of Technetium 34m Sestamibi. Stress Procedure:  The patient performed treadmill exercise  using a Bruce  Protocol for 5 minutes 7 seconds. The patient stopped due to extreme shortness of breath and light-headedness. Patient experienced some chest tightness.  There were no significant ST-T wave changes.  Technetium 82m Sestamibi was injected IV at peak exercise and myocardial perfusion imaging was performed after a brief delay.  Transient Ischemic Dilatation (Normal <1.22):  1.0  QGS EDV:  63 ml QGS ESV:  24 ml LV Ejection Fraction: 62%        Rest ECG: NSR - Normal EKG  Stress ECG: No significant change from baseline ECG  QPS Raw Data Images:  Normal; no motion artifact; normal heart/lung ratio. Stress Images:  Normal homogeneous uptake in all areas of the myocardium. Rest Images:  Normal homogeneous uptake in all areas of the myocardium. Subtraction (SDS):  No evidence of ischemia.  Impression Exercise Capacity:  Good exercise capacity. BP Response:  Normal blood pressure response. Clinical Symptoms:  No significant symptoms noted. ECG Impression:  No significant ST segment change suggestive of ischemia. Comparison with Prior Nuclear Study: No images to compare  Overall Impression:  Normal stress nuclear study.  LV Wall Motion:  NL LV Function; NL Wall Motion   Lorretta Harp, MD  08/06/2014 4:19 PM

## 2014-08-07 NOTE — Progress Notes (Signed)
Quick Note:  Echo results: Good news: Essentially normal echocardiogram and normal pump function and normal valve function.  There is calcium build-up on the Aortic Valve -- called Sclerosis (that causes turbulent flow heard as a murmur) & not Stenosis (narrowing or incomplete opening of the valve that also causes a murmur) No signs to suggest heart attack.. EF: 55-60%. No regional wall motion abnormalities   ______

## 2014-08-07 NOTE — Progress Notes (Signed)
Quick Note:  Stress Test looked good!! No sign of significant Heart Artery Disease. Pump function is normal.  Good news!!.  Dawnette Mione W, MD  ______ 

## 2014-08-11 ENCOUNTER — Telehealth: Payer: Self-pay | Admitting: *Deleted

## 2014-08-11 NOTE — Telephone Encounter (Signed)
-----   Message from Leonie Man, MD sent at 08/07/2014  8:02 AM EDT ----- Stress Test looked good!! No sign of significant Heart Artery Disease.  Pump function is normal.  Good news!!.  Leonie Man, MD

## 2014-08-11 NOTE — Telephone Encounter (Signed)
-----   Message from Leonie Man, MD sent at 08/07/2014  8:05 AM EDT ----- Echo results: Good news: Essentially normal echocardiogram and normal pump function and normal valve function.   There is calcium build-up on the Aortic Valve -- called Sclerosis (that causes turbulent flow heard as a murmur) & not Stenosis (narrowing or incomplete opening of the valve that also causes a murmur) No signs to suggest heart attack.. EF: 55-60%. No regional wall motion abnormalities

## 2014-08-11 NOTE — Telephone Encounter (Signed)
Spoke to patient. Echo, stress test Result given . Verbalized understanding

## 2014-08-12 ENCOUNTER — Encounter (HOSPITAL_COMMUNITY): Payer: BC Managed Care – PPO

## 2014-08-14 ENCOUNTER — Other Ambulatory Visit (HOSPITAL_COMMUNITY): Payer: Self-pay | Admitting: Oncology

## 2014-08-14 ENCOUNTER — Telehealth (HOSPITAL_COMMUNITY): Payer: Self-pay | Admitting: *Deleted

## 2014-08-14 ENCOUNTER — Encounter (HOSPITAL_COMMUNITY): Payer: Medicare Other | Attending: Internal Medicine

## 2014-08-14 DIAGNOSIS — Z951 Presence of aortocoronary bypass graft: Secondary | ICD-10-CM | POA: Diagnosis not present

## 2014-08-14 DIAGNOSIS — Z7901 Long term (current) use of anticoagulants: Secondary | ICD-10-CM | POA: Insufficient documentation

## 2014-08-14 DIAGNOSIS — C8190 Hodgkin lymphoma, unspecified, unspecified site: Secondary | ICD-10-CM | POA: Diagnosis not present

## 2014-08-14 DIAGNOSIS — C50929 Malignant neoplasm of unspecified site of unspecified male breast: Secondary | ICD-10-CM | POA: Diagnosis not present

## 2014-08-14 DIAGNOSIS — Z803 Family history of malignant neoplasm of breast: Secondary | ICD-10-CM | POA: Diagnosis not present

## 2014-08-14 DIAGNOSIS — Z8041 Family history of malignant neoplasm of ovary: Secondary | ICD-10-CM | POA: Insufficient documentation

## 2014-08-14 LAB — PROTIME-INR
INR: 1.98 — ABNORMAL HIGH (ref 0.00–1.49)
Prothrombin Time: 22.7 seconds — ABNORMAL HIGH (ref 11.6–15.2)

## 2014-08-14 NOTE — Progress Notes (Signed)
LABS FOR PT/INR 

## 2014-08-14 NOTE — Telephone Encounter (Signed)
Pt instructed to take change Coumadin dose to 4mg , 3mg , 3mg  repeating - verbalizes understanding; scheduled for INR in 2 weeks.     1040:  Pt contacted to clarify current Coumadin dose; states he is presently taking 3mg  alternating with 4 mg (not 3mg  daily as documented on 07/03/14). Kirby Crigler, PA notified; per PA, instructed pt to change dose to 4mg , 4mg , 3mg  repeating; will still return in 2 weeks for INR recheck

## 2014-08-17 ENCOUNTER — Ambulatory Visit (HOSPITAL_BASED_OUTPATIENT_CLINIC_OR_DEPARTMENT_OTHER): Payer: Medicare Other | Admitting: Genetic Counselor

## 2014-08-17 ENCOUNTER — Encounter (HOSPITAL_COMMUNITY): Payer: Self-pay

## 2014-08-17 ENCOUNTER — Encounter: Payer: Self-pay | Admitting: Genetic Counselor

## 2014-08-17 ENCOUNTER — Other Ambulatory Visit: Payer: Medicare Other

## 2014-08-17 DIAGNOSIS — Z8041 Family history of malignant neoplasm of ovary: Secondary | ICD-10-CM

## 2014-08-17 DIAGNOSIS — C50921 Malignant neoplasm of unspecified site of right male breast: Secondary | ICD-10-CM

## 2014-08-17 DIAGNOSIS — C801 Malignant (primary) neoplasm, unspecified: Secondary | ICD-10-CM | POA: Insufficient documentation

## 2014-08-17 DIAGNOSIS — C8191 Hodgkin lymphoma, unspecified, lymph nodes of head, face, and neck: Secondary | ICD-10-CM | POA: Diagnosis not present

## 2014-08-17 DIAGNOSIS — Z803 Family history of malignant neoplasm of breast: Secondary | ICD-10-CM

## 2014-08-17 DIAGNOSIS — C8190 Hodgkin lymphoma, unspecified, unspecified site: Secondary | ICD-10-CM

## 2014-08-17 DIAGNOSIS — Z315 Encounter for genetic counseling: Secondary | ICD-10-CM

## 2014-08-17 DIAGNOSIS — C50911 Malignant neoplasm of unspecified site of right female breast: Secondary | ICD-10-CM

## 2014-08-17 NOTE — Progress Notes (Signed)
Buel Ream, PA requested a consultation for genetic counseling and risk assessment for Ryan [name], "Ryan [name]", a 63 y.o. male, for discussion of his personal history of lymphoma and breast cancer and family history of breast and ovarian cancer.  He presents to clinic today to discuss the possibility of a genetic predisposition to cancer, and to further clarify his risks, as well as his family members' risks for cancer.   HISTORY OF PRESENT ILLNESS: In 18, at the age of 65, Ryan [name] was diagnosed with Hodgkin's lymphoma in his right neck. This was treated with radiation.  In 2015, at the age of 1, he was diagnosed with right breast cancer.  This was treated with mastectomy.    Past Medical History  Diagnosis Date  . Invasive ductal carcinoma of breast, stage 2 04/27/2011    chemo, mastectomy  . Right carotid artery occlusion 04/27/2011  . Left carotid artery partial occlusion 04/27/2011    CAROTIC DOPPLER 11/2013: < 40% R ICA stenosis, distal waveforms damped - suggests probably intracranial occlusoin, < 02% LICA, normal vertebrals -- no change  . Stroke     2007  . Hypercholesterolemia   . Adie's pupil   . H/O Hodgkin's disease 30  . Thyroid disease   . S/P CABG x 3 04/09/2013    LIMA-LAD, fLRAD-RI, SVG-RCA  . CAD (coronary artery disease), native coronary artery 04/09/2013    Dist LM stenosis 70-80% (at trifurcation into LAD, CX, & 2 Ramus branches, 70% mid RCA)  . Essential hypertension 07/27/2014  . Cancer     Lymphoma - age 20 (38)    Past Surgical History  Procedure Laterality Date  . Mastectomy  rt  . Lymph node biopsy      left neck  . Coronary artery bypass graft [name] 04/08/2013    Procedure: CORONARY ARTERY BYPASS GRAFTING (CABG);  Surgeon: Ivin Poot, MD;  Location: [place];  Service: Open Heart Surgery;  Laterality: [name];  Times 3 using left internal mammary artery; endoscopically harvested right saphenous vein and left radial artery  . Intraoperative  transesophageal echocardiogram [name] 04/08/2013    Procedure: INTRAOPERATIVE TRANSESOPHAGEAL ECHOCARDIOGRAM;  Surgeon: Ivin Poot, MD;  Location: [place];  Service: Open Heart Surgery;  Laterality: [name];  . Radial artery harvest Left 04/08/2013    Procedure: RADIAL ARTERY HARVEST;  Surgeon: Ivin Poot, MD;  Location: [place];  Service: Open Heart Surgery;  Laterality: Left;    History   Social History  . Marital Status: Married    Spouse Name: [name]    Number of Children: 2  . Years of Education: [name]   Social History Main Topics  . Smoking status: Former Smoker -- 1.00 packs/day    Types: Cigarettes    Quit date: 06/09/1982  . Smokeless tobacco: Never Used  . Alcohol Use: No  . Drug Use: No  . Sexual Activity: None   Other Topics Concern  . None   Social History Narrative   Married. Former smoker who quit in 1983.   No routine exercise - gained ~40lb post CABG.    PERSONAL RISK ASSESSMENT FACTORS: Treated with radiation for Lymphoma in 1983 on right side of neck   FAMILY HISTORY:  We obtained a detailed, 4-generation family history.  Significant diagnoses are listed below: Family History  Problem Relation Age of Onset  . Alzheimer's disease Mother   . Stroke Mother   . Hypertension Mother   . Hyperlipidemia Mother   . Diabetes Mother   .  Hypertension Father   . Diabetes Paternal Grandmother   . Diabetes Paternal Grandfather   . Breast cancer Paternal Aunt   . Ovarian cancer Paternal Aunt   Ryan [name] is an only child.  He comes from a large family, having 7 maternal uncles and one maternal aunt.  The aunt had 32 children. No reported maternal family history of cancer.  His father had five sisters - one sister had breast cancer and another possibly ovarian.  His youngest daughter has several health issues including obesity, increased pressure on the brain, and multiple abnormal paps.  Patient's ancestors are of Caucasian descent. There is no reported Ashkenazi Jewish  ancestry. There is no known consanguinity.  GENETIC COUNSELING ASSESSMENT: Ryan [name] is a 63 y.o. male with a personal history of breast cancer and family history of breast and ovarian cancer which somewhat suggestive of a hereditary breast and ovarian cancer syndrome and predisposition to cancer. We, therefore, discussed and recommended the following at today's visit.   DISCUSSION: We reviewed the characteristics, features and inheritance patterns of hereditary cancer syndromes. We also discussed genetic testing, including the appropriate family members to test, the process of testing, insurance coverage and turn-around-time for results. We reviewed the common genes associated with hereditary male breast cancer including BRCA1, BRCA2, PALB2, and CHEK2.  We discussed that there could be other genes that increase the risk for male breast cancer but that breast cancer in men is uncommon so it is difficult to fully grasp.  Therefore we could undergo targeted testing of the 4 genes mentioned, or increase the spectrum to a larger panel.  Because of his worry for his daughters, he wanted to undergo genetic testing for the breast/ovairan cancer panel.  PLAN: After considering the risks, benefits, and limitations, Ryan [name] provided informed consent to pursue genetic testing and the blood sample will be sent to [facility] for analysis of the Breast/Ovarian Cancer Panel. We discussed the implications of a positive, negative and/ or variant of uncertain significance genetic test result. Results should be available within approximately 2-4 weeks' time, at which point they will be disclosed by telephone to Ryan [name], as will any additional recommendations warranted by these results. Ryan [name] will receive a summary of his genetic counseling visit and a copy of his results once available. This information will also be available in Epic. We encouraged Ryan [name] to remain in  contact with cancer genetics annually so that we can continuously update the family history and inform him of any changes in cancer genetics and testing that may be of benefit for his family. Ryan [name]'s questions were answered to his satisfaction today. Our contact information was provided should additional questions or concerns arise.  The patient was seen for a total of 60 minutes, greater than 50% of which was spent face-to-face counseling.  This note will also be sent to the referring provider via the electronic medical record. The patient will be supplied with a summary of this genetic counseling discussion as well as educational information on the discussed hereditary cancer syndromes following the conclusion of their visit.     _______________________________________________________________________ For Office Staff:  Number of people involved in session: 2 Was an Intern/ student involved with case: no

## 2014-08-18 ENCOUNTER — Encounter: Payer: Self-pay | Admitting: Genetic Counselor

## 2014-08-18 ENCOUNTER — Ambulatory Visit (HOSPITAL_COMMUNITY)
Admission: RE | Admit: 2014-08-18 | Discharge: 2014-08-18 | Disposition: A | Discharge status: Home / Self Care | Payer: Medicare Other | Source: Ambulatory Visit | Attending: Oncology | Admitting: Oncology

## 2014-08-18 ENCOUNTER — Other Ambulatory Visit (HOSPITAL_COMMUNITY): Payer: Self-pay | Admitting: Oncology

## 2014-08-18 DIAGNOSIS — C8190 Hodgkin lymphoma, unspecified, unspecified site: Secondary | ICD-10-CM | POA: Diagnosis not present

## 2014-08-18 DIAGNOSIS — Z853 Personal history of malignant neoplasm of breast: Secondary | ICD-10-CM | POA: Insufficient documentation

## 2014-08-18 DIAGNOSIS — Z9011 Acquired absence of right breast and nipple: Secondary | ICD-10-CM | POA: Insufficient documentation

## 2014-08-18 DIAGNOSIS — Z1231 Encounter for screening mammogram for malignant neoplasm of breast: Secondary | ICD-10-CM | POA: Insufficient documentation

## 2014-08-18 DIAGNOSIS — Z803 Family history of malignant neoplasm of breast: Secondary | ICD-10-CM | POA: Diagnosis not present

## 2014-08-18 DIAGNOSIS — C50929 Malignant neoplasm of unspecified site of unspecified male breast: Secondary | ICD-10-CM | POA: Diagnosis not present

## 2014-08-18 DIAGNOSIS — C50919 Malignant neoplasm of unspecified site of unspecified female breast: Secondary | ICD-10-CM

## 2014-08-18 DIAGNOSIS — R928 Other abnormal and inconclusive findings on diagnostic imaging of breast: Secondary | ICD-10-CM | POA: Diagnosis not present

## 2014-08-18 DIAGNOSIS — Z8041 Family history of malignant neoplasm of ovary: Secondary | ICD-10-CM | POA: Diagnosis not present

## 2014-08-18 NOTE — Progress Notes (Signed)
Received an email from [facility] stating: "given his previous history of lymphoma, our preference is to test fibroblasts (new policy) from a skin bx as there is a greater risk for false positive and false negative results in individuals with current or previous history of a hematologic malignancy on blood or oral rinse specimens.  If getting a skin bx is not an option, we can run the specimen we received, but there will be caveat language on the report about this issue whether he is positive or negative."  They went on to state:  "There are no exact numbers as this data is just starting to emerge, but the belief is that it will be low".  After consulting with Dr. Jana Hakim, it was agreed to proceed with blood testing, and the patient will be discussed in the genetics conference on Thursday.

## 2014-08-20 ENCOUNTER — Ambulatory Visit (HOSPITAL_COMMUNITY)
Admission: RE | Admit: 2014-08-20 | Discharge: 2014-08-20 | Disposition: A | Discharge status: Home / Self Care | Payer: Medicare Other | Source: Ambulatory Visit | Attending: Hematology and Oncology | Admitting: Hematology and Oncology

## 2014-08-20 ENCOUNTER — Encounter (HOSPITAL_COMMUNITY): Payer: Self-pay | Admitting: Physical Therapy

## 2014-08-20 DIAGNOSIS — R5383 Other fatigue: Secondary | ICD-10-CM | POA: Diagnosis not present

## 2014-08-20 DIAGNOSIS — R262 Difficulty in walking, not elsewhere classified: Secondary | ICD-10-CM | POA: Insufficient documentation

## 2014-08-20 DIAGNOSIS — M6281 Muscle weakness (generalized): Secondary | ICD-10-CM | POA: Insufficient documentation

## 2014-08-20 DIAGNOSIS — Z5189 Encounter for other specified aftercare: Secondary | ICD-10-CM | POA: Diagnosis not present

## 2014-08-20 NOTE — Therapy (Signed)
Physical Therapy Evaluation  Patient Details  Name: Ryan [name] MRN: [id number] Date of Birth: [birth year]  Encounter Date: 08/20/2014      PT End of Session - 08/20/14 2044    Visit Number 1   Number of Visits 5   Date for PT Re-Evaluation 09/19/14   Authorization Type Medicare and [employer]   Authorization - Visit Number 1   Authorization - Number of Visits 5   Activity Tolerance Patient tolerated treatment well   Behavior During Therapy [facility] for tasks assessed/performed      Past Medical History  Diagnosis Date  . Invasive ductal carcinoma of breast, stage 2 04/27/2011    chemo, mastectomy  . Right carotid artery occlusion 04/27/2011  . Left carotid artery partial occlusion 04/27/2011    CAROTIC DOPPLER 11/2013: < 40% R ICA stenosis, distal waveforms damped - suggests probably intracranial occlusoin, < 57% LICA, normal vertebrals -- no change  . Stroke     2007  . Hypercholesterolemia   . Adie's pupil   . H/O Hodgkin's disease 60  . Thyroid disease   . S/P CABG x 3 04/09/2013    LIMA-LAD, fLRAD-RI, SVG-RCA  . CAD (coronary artery disease), native coronary artery 04/09/2013    Dist LM stenosis 70-80% (at trifurcation into LAD, CX, & 2 Ramus branches, 70% mid RCA)  . Essential hypertension 07/27/2014  . Cancer     Lymphoma - age 42 (27)    Past Surgical History  Procedure Laterality Date  . Mastectomy  rt  . Lymph node biopsy      left neck  . Coronary artery bypass graft [name] 04/08/2013    Procedure: CORONARY ARTERY BYPASS GRAFTING (CABG);  Surgeon: Ivin Poot, MD;  Location: [place];  Service: Open Heart Surgery;  Laterality: [name];  Times 3 using left internal mammary artery; endoscopically harvested right saphenous vein and left radial artery  . Intraoperative transesophageal echocardiogram [name] 04/08/2013    Procedure: INTRAOPERATIVE TRANSESOPHAGEAL ECHOCARDIOGRAM;  Surgeon: Ivin Poot, MD;  Location: [place];  Service: Open Heart Surgery;  Laterality: [name];  .  Radial artery harvest Left 04/08/2013    Procedure: RADIAL ARTERY HARVEST;  Surgeon: Ivin Poot, MD;  Location: [place];  Service: Open Heart Surgery;  Laterality: Left;    There were no vitals taken for this visit.  Visit Diagnosis:  Fatigue due to treatment  Difficulty walking up hill  Difficulty walking      Subjective Assessment - 08/20/14 2025    Symptoms primary complaint of fatigue.    Pertinent History Hodgkins cancer 1983, Treatment for Rt breast cancer via surgery and Oral chemo in 2013, triple bypass 2014 July. Patient is cancer free as noted by recent bilateral  mamograhm, recent stress test performed noting good health that his hear this working fine and there are no blockages.  history of smoking, quite in 1983.  Patient notes that when he under went cardiac rehab he was great.    Limitations Walking   How long can you walk comfortably? 66miutes, less with inclines an dabnormal surfaces.    Patient Stated Goals to be bale to walk and go up and down stairs, to be able to play golf.    Currently in Pain? No/denies   Pain Score 0-No pain          [facility] PT Assessment - 08/20/14 2026    Assessment   Medical Diagnosis Cancer related fatigue   Onset Date 08/20/12   Next MD Visit  Barnet Glasgow and Noni Saupe    Prior Therapy Cardiac rehab only   Observation/Other Assessments   Observations FACIT-F TOI 63, FACT-G 63, FACIT-F 72,  Fatigue VAS: 7/10, Pain VAS: 0/10. Distress VAS 0/10   Other:   Other/ Comments 63min walk test: 1257ft   Other:   Other/Comments Gait speed: 1.1 m/s   Strength   Overall Strength Within functional limits for tasks performed   Overall Strength Comments LE strength 5/5 MMT grossly tested.    6 Minute Walk- Baseline   6 Minute Walk- Baseline yes   Timed Up and Go Test   Normal TUG (seconds) 9.97            PT Education - 28-Aug-2014 01-16-27    Education provided Yes   Education Details Patient to walk daiy at least 2x a week fr 76minutes,  Patient given [facility] survivor handbook   Person(s) Educated Patient   Methods Explanation   Comprehension Verbalized understanding          PT Short Term Goals - 08/28/2014 16-Jan-2039    PT SHORT TERM GOAL #1   Title Patient will be able to walk 1556ft durign 53min walk test to indicate improving endurace   Time 4   Period Weeks   Status New   PT SHORT TERM GOAL #2   Title Patient will be fill ot the Fatigue VAS averaging <3/10 indicating patient does not complain of fatigue a significant amount.    Time 4   Period Weeks   PT SHORT TERM GOAL #3   Title Patient will dmeosntrate a gait speed of >1.2 m/s to indicate ability to walk at community ambulatory status for at least short durations.    Time 4   Period Weeks   Status New            Plan - Aug 28, 2014 01/16/43    Pt will benefit from skilled therapeutic intervention in order to improve on the following deficits Decreased activity tolerance;Decreased endurance;Difficulty walking   Rehab Potential Good   PT Frequency 1x / week   PT Duration 4 weeks   PT Treatment/Interventions Therapeutic exercise;Energy conservation;Patient/family education;Gait training;Stair training   PT Next Visit Plan Educate patient on activities to be performed to increase activity tolerance. Also introduce stair training   PT Home Exercise Plan walkign at least 3x daily.    Consulted and Agree with Plan of Care Patient          G-Codes - 08-28-14 January 16, 2055    Functional Assessment Tool Used Clinical judgment   Functional Limitation Mobility: Walking and moving around   Mobility: Walking and Moving Around Current Status [phone]) At least 20 percent but less than 40 percent impaired, limited or restricted   Mobility: Walking and Moving Around Goal Status [phone]) At least 1 percent but less than 20 percent impaired, limited or restricted      Problem List Patient Active Problem List   Diagnosis Date Noted  . Cancer   . Exertional dyspnea 07/27/2014  .  Essential hypertension 07/27/2014  . Occlusion and stenosis of carotid artery without mention of cerebral infarction 11/12/2013  . Coronary atherosclerosis of native coronary artery - multivessel CAD, status post CABG x3 (LIMA-LAD, Left Radial-RI, SVG-RCA) 08/02/2013  . Fatigue 08/02/2013  . Dizzy spells 07/14/2013  . Orthostatic hypotension 07/14/2013  . Anemia associated with acute blood loss, post op and with chronic disease 07/14/2013  . Obesity (BMI 30-39.9) 05/05/2013  . CAD in native artery - status post CABG  x3 (LIMA-LAD, Left Radial-Ramus Intermedius, SVG-RCA 05/05/2013    Class: Diagnosis of  . Chronic anticoagulation- Coumadin 04/10/2013  . S/P CABG x 3  LIMA-LAD, free LRA to RI, SVG-RCA  - 04/09/13 04/09/2013  . Angina, class III - cath 04/07/13- 3V CAD 04/02/2013    Class: Hospitalized for  . Heart palpitations 04/02/2013  . Claudication of calf muscles 04/02/2013  . Hyperlipidemia with target LDL less than 70   . Invasive ductal carcinoma of right breast, stage 2- 2012 s/p chemo and mastectomy 04/27/2011  . Hodgkin's [id number] 04/27/2011  . Known RICA occlusion (CVA '07) partial LICA stenosis (29-92%) Jan 2014 04/27/2011  . Occlusion and stenosis of carotid artery with cerebral infarction; Known RICA occlusion (CVA '07), partial LICA stenosis (42-68%) 08/02/2006       Anayiah Howden R 08/20/2014, 8:57 PM

## 2014-08-20 NOTE — Addendum Note (Signed)
Encounter addended by: Leia Alf, PT on: 08/20/2014  9:00 PM<BR>     Documentation filed: Orders

## 2014-08-27 ENCOUNTER — Ambulatory Visit (HOSPITAL_COMMUNITY)
Admission: RE | Admit: 2014-08-27 | Discharge: 2014-08-27 | Disposition: A | Discharge status: Home / Self Care | Payer: Medicare Other | Source: Ambulatory Visit | Attending: Internal Medicine | Admitting: Internal Medicine

## 2014-08-27 DIAGNOSIS — Z5189 Encounter for other specified aftercare: Secondary | ICD-10-CM | POA: Diagnosis not present

## 2014-08-27 DIAGNOSIS — R5383 Other fatigue: Secondary | ICD-10-CM

## 2014-08-27 DIAGNOSIS — R262 Difficulty in walking, not elsewhere classified: Secondary | ICD-10-CM

## 2014-08-27 NOTE — Therapy (Signed)
Physical Therapy Treatment  Patient Details  Name: Ryan [name] MRN: [id number] Date of Birth: 1950-10-31  Encounter Date: 08/27/2014      PT End of Session - 08/27/14 1021    Visit Number 2   Number of Visits 5   Date for PT Re-Evaluation 09/19/14   Authorization Type Medicare and [employer]et[employer]   Authorization - Visit Number 2   Authorization - Number of Visits 5   PT Start Time 0932   PT Stop Time 1017   PT Time Calculation (min) 45 min   Activity Tolerance Patient tolerated treatment well;Patient limited by fatigue   Behavior During Therapy [facility] for tasks assessed/performed      Past Medical History  Diagnosis Date  . Invasive ductal carcinoma of breast, stage 2 04/27/2011    chemo, mastectomy  . Right carotid artery occlusion 04/27/2011  . Left carotid artery partial occlusion 04/27/2011    CAROTIC DOPPLER 11/2013: < 40% R ICA stenosis, distal waveforms damped - suggests probably intracranial occlusoin, < 07% LICA, normal vertebrals -- no change  . Stroke     2007  . Hypercholesterolemia   . Adie's pupil   . H/O Hodgkin's disease 25  . Thyroid disease   . S/P CABG x 3 04/09/2013    LIMA-LAD, fLRAD-RI, SVG-RCA  . CAD (coronary artery disease), native coronary artery 04/09/2013    Dist LM stenosis 70-80% (at trifurcation into LAD, CX, & 2 Ramus branches, 70% mid RCA)  . Essential hypertension 07/27/2014  . Cancer     Lymphoma - age 23 (79)    Past Surgical History  Procedure Laterality Date  . Mastectomy  rt  . Lymph node biopsy      left neck  . Coronary artery bypass graft [name] 04/08/2013    Procedure: CORONARY ARTERY BYPASS GRAFTING (CABG);  Surgeon: Ivin Poot, MD;  Location: [place];  Service: Open Heart Surgery;  Laterality: [name];  Times 3 using left internal mammary artery; endoscopically harvested right saphenous vein and left radial artery  . Intraoperative transesophageal echocardiogram [name] 04/08/2013    Procedure: INTRAOPERATIVE TRANSESOPHAGEAL ECHOCARDIOGRAM;   Surgeon: Ivin Poot, MD;  Location: [place];  Service: Open Heart Surgery;  Laterality: [name];  . Radial artery harvest Left 04/08/2013    Procedure: RADIAL ARTERY HARVEST;  Surgeon: Ivin Poot, MD;  Location: [place];  Service: Open Heart Surgery;  Laterality: Left;    There were no vitals taken for this visit.  Visit Diagnosis:  Fatigue due to treatment  Difficulty walking up hill  Difficulty walking      Subjective Assessment - 08/27/14 0939    Symptoms no complaints of pain, "feeling pretty good today".  Pt reports he has been trying to increase walking, though keeps forgetting his pedometer at home.           [facility] PT Assessment - 08/27/14 0941    Assessment   Medical Diagnosis Cancer related fatigue   Onset Date 08/20/12   Next MD Visit Barnet Glasgow and Noni Saupe    Prior Therapy Cardiac rehab only          [facility] Adult PT Treatment/Exercise - 08/27/14 0941    Exercises   Exercises Knee/Hip   Knee/Hip Exercises: Stretches   Active Hamstring Stretch 3 reps;30 seconds   Active Hamstring Stretch Limitations 14" Box   Quad Stretch 3 reps;30 seconds   Quad Stretch Limitations Prone with rope   Piriformis Stretch 3 reps;30 seconds   Piriformis Stretch Limitations  Seated   Gastroc Stretch 3 reps;30 seconds   Gastroc Stretch Limitations Slantboard   Knee/Hip Exercises: Aerobic   Stationary [place] #5, Lv 4, 6' average SPM 133   [place] 2.0 mph, 5'   Knee/Hip Exercises: Standing   Forward Lunges Limitations Walking Lunges 30' RT   Stairs 7" step through x5   Knee/Hip Exercises: Seated   Other Seated Knee Exercises STS 2x5, no UE          PT Education - 08/27/14 1020    Education provided Yes   Education Details Added stairs to HEP.  Educated pt on technique to increase activity tolerance (increasing steps/time as tolerated)   Person(s) Educated Patient   Methods Explanation   Comprehension Verbalized understanding          PT Short Term  Goals - 08/27/14 1032    PT SHORT TERM GOAL #1   Title Patient will be able to walk 1513ft durign 44min walk test to indicate improving endurace   Status On-going   PT SHORT TERM GOAL #2   Title Patient will be fill ot the Fatigue VAS averaging <3/10 indicating patient does not complain of fatigue a significant amount.    Status On-going   PT SHORT TERM GOAL #3   Title Patient will demonstrate a gait speed of >1.2 m/s to indicate ability to walk at community ambulatory status for at least short durations.    Status On-going            Plan - 08/27/14 1023    Clinical Impression Statement Initiated PT POC, working primarily on increasing activity tolerance and education for program at home.  Max exertion score with walking lunges at 6-7/10 today.  No significant rest breaks, though stretches were used between activities for active rest break.   Pt reports he has been doing a walking program "some", though repots he has forgotten his pedometer at homea lot; educated pt to keep pedometer with glasses to avoid forgetting it during functional activities.    Pt will benefit from skilled therapeutic intervention in order to improve on the following deficits Decreased activity tolerance;Decreased endurance;Difficulty walking   Rehab Potential Good   PT Frequency 1x / week   PT Duration 4 weeks   PT Treatment/Interventions Therapeutic exercise;Energy conservation;Patient/family education;Gait training;Stair training   PT Next Visit Plan Progress exercise program, increasing activity tolerance with increased time/reps.    PT Home Exercise Plan Added stair climbing to HEP        Problem List Patient Active Problem List   Diagnosis Date Noted  . Cancer   . Exertional dyspnea 07/27/2014  . Essential hypertension 07/27/2014  . Occlusion and stenosis of carotid artery without mention of cerebral infarction 11/12/2013  . Coronary atherosclerosis of native coronary artery - multivessel CAD,  status post CABG x3 (LIMA-LAD, Left Radial-RI, SVG-RCA) 08/02/2013  . Fatigue 08/02/2013  . Dizzy spells 07/14/2013  . Orthostatic hypotension 07/14/2013  . Anemia associated with acute blood loss, post op and with chronic disease 07/14/2013  . Obesity (BMI 30-39.9) 05/05/2013  . CAD in native artery - status post CABG x3 (LIMA-LAD, Left Radial-Ramus Intermedius, SVG-RCA 05/05/2013    Class: Diagnosis of  . Chronic anticoagulation- Coumadin 04/10/2013  . S/P CABG x 3  LIMA-LAD, free LRA to RI, SVG-RCA  - 04/09/13 04/09/2013  . Angina, class III - cath 04/07/13- 3V CAD 04/02/2013    Class: Hospitalized for  . Heart palpitations 04/02/2013  . Claudication of calf  muscles 04/02/2013  . Hyperlipidemia with target LDL less than 70   . Invasive ductal carcinoma of right breast, stage 2- 2012 s/p chemo and mastectomy 04/27/2011  . Hodgkin's [id number] 04/27/2011  . Known RICA occlusion (CVA '07) partial LICA stenosis (94-99%) Jan 2014 04/27/2011  . Occlusion and stenosis of carotid artery with cerebral infarction; Known RICA occlusion (CVA '07), partial LICA stenosis (71-82%) 08/02/2006       Tycen Dockter 08/27/2014, 10:33 AM

## 2014-08-28 ENCOUNTER — Other Ambulatory Visit (HOSPITAL_COMMUNITY): Payer: Self-pay

## 2014-08-28 ENCOUNTER — Encounter (HOSPITAL_BASED_OUTPATIENT_CLINIC_OR_DEPARTMENT_OTHER): Payer: Medicare Other

## 2014-08-28 DIAGNOSIS — Z7901 Long term (current) use of anticoagulants: Secondary | ICD-10-CM | POA: Diagnosis not present

## 2014-08-28 DIAGNOSIS — C8190 Hodgkin lymphoma, unspecified, unspecified site: Secondary | ICD-10-CM | POA: Diagnosis not present

## 2014-08-28 LAB — PROTIME-INR
INR: 2.11 — ABNORMAL HIGH (ref 0.00–1.49)
PROTHROMBIN TIME: 23.9 s — AB (ref 11.6–15.2)

## 2014-08-28 NOTE — Progress Notes (Signed)
Labs for pt/inr 

## 2014-09-02 ENCOUNTER — Ambulatory Visit (HOSPITAL_COMMUNITY)
Admission: RE | Admit: 2014-09-02 | Discharge: 2014-09-02 | Disposition: A | Discharge status: Home / Self Care | Payer: Medicare Other | Source: Ambulatory Visit | Attending: Hematology and Oncology | Admitting: Hematology and Oncology

## 2014-09-02 DIAGNOSIS — R262 Difficulty in walking, not elsewhere classified: Secondary | ICD-10-CM

## 2014-09-02 DIAGNOSIS — Z5189 Encounter for other specified aftercare: Secondary | ICD-10-CM | POA: Diagnosis not present

## 2014-09-02 DIAGNOSIS — R5383 Other fatigue: Secondary | ICD-10-CM

## 2014-09-02 NOTE — Therapy (Signed)
Physical Therapy Treatment  Patient Details  Name: Ryan [name] MRN: [id number] Date of Birth: [birth year]  Encounter Date: 09/02/2014      PT End of Session - 09/02/14 1309    Visit Number 3   Number of Visits 5   Date for PT Re-Evaluation 09/19/14   Authorization Type Medicare and [employer]   Authorization - Visit Number 3   Authorization - Number of Visits 5   PT Start Time 1301   PT Stop Time 1344   PT Time Calculation (min) 43 min   Activity Tolerance Patient tolerated treatment well;Patient limited by fatigue   Behavior During Therapy [facility] for tasks assessed/performed      Past Medical History  Diagnosis Date  . Invasive ductal carcinoma of breast, stage 2 04/27/2011    chemo, mastectomy  . Right carotid artery occlusion 04/27/2011  . Left carotid artery partial occlusion 04/27/2011    CAROTIC DOPPLER 11/2013: < 40% R ICA stenosis, distal waveforms damped - suggests probably intracranial occlusoin, < 40% LICA, normal vertebrals -- no change  . Stroke     2007  . Hypercholesterolemia   . Adie's pupil   . H/O Hodgkin's disease 68  . Thyroid disease   . S/P CABG x 3 04/09/2013    LIMA-LAD, fLRAD-RI, SVG-RCA  . CAD (coronary artery disease), native coronary artery 04/09/2013    Dist LM stenosis 70-80% (at trifurcation into LAD, CX, & 2 Ramus branches, 70% mid RCA)  . Essential hypertension 07/27/2014  . Cancer     Lymphoma - age 63 (69)    Past Surgical History  Procedure Laterality Date  . Mastectomy  rt  . Lymph node biopsy      left neck  . Coronary artery bypass graft [name] 04/08/2013    Procedure: CORONARY ARTERY BYPASS GRAFTING (CABG);  Surgeon: Ivin Poot, MD;  Location: [place];  Service: Open Heart Surgery;  Laterality: [name];  Times 3 using left internal mammary artery; endoscopically harvested right saphenous vein and left radial artery  . Intraoperative transesophageal echocardiogram [name] 04/08/2013    Procedure: INTRAOPERATIVE TRANSESOPHAGEAL ECHOCARDIOGRAM;   Surgeon: Ivin Poot, MD;  Location: [place];  Service: Open Heart Surgery;  Laterality: [name];  . Radial artery harvest Left 04/08/2013    Procedure: RADIAL ARTERY HARVEST;  Surgeon: Ivin Poot, MD;  Location: [place];  Service: Open Heart Surgery;  Laterality: Left;    There were no vitals taken for this visit.  Visit Diagnosis:  Fatigue due to treatment  Difficulty walking up hill  Difficulty walking      Subjective Assessment - 09/02/14 1304    Symptoms no complaints of pain, "feeling pretty good today".  Pt reports he has been trying to increase walking, though keeps forgetting his pedometer at home. Patient states he has felt some soreness following hist stretches.   Currently in Pain? No/denies            [facility] Adult PT Treatment/Exercise - 09/02/14 0001    Knee/Hip Exercises: Stretches   Active Hamstring Stretch 3 reps;30 seconds   Active Hamstring Stretch Limitations 14" Box   Quad Stretch 3 reps;30 seconds   Quad Stretch Limitations Prone with rope   Piriformis Stretch 3 reps;30 seconds   Piriformis Stretch Limitations Seated   Gastroc Stretch 3 reps;30 seconds   Gastroc Stretch Limitations Slantboard   Knee/Hip Exercises: Standing   Forward Lunges Limitations Walking Lunges 30' RT   Side Lunges Limitations walking lunges 20ft each  Other Standing Knee Exercises Sumo walk, retro-monster walk, blue Tband.           PT Education - 09/02/14 1308    Education Details Emphasized importance of walking program as well as need for stretches to maintain hip and knee mobility,    Person(s) Educated Patient   Methods Explanation   Comprehension Verbalized understanding          PT Short Term Goals - 09/02/14 1339    PT SHORT TERM GOAL #1   Title Patient will be able to walk 1568ft durign 55min walk test to indicate improving endurace   Status On-going   PT SHORT TERM GOAL #2   Title Patient will be fill ot the Fatigue VAS averaging <3/10 indicating patient  does not complain of fatigue a significant amount.    Status On-going   PT SHORT TERM GOAL #3   Title Patient will demonstrate a gait speed of >1.2 m/s to indicate ability to walk at community ambulatory status for at least short durations.    Status On-going            Plan - 09/02/14 1337    Clinical Impression Statement continued education on strengtheign and walking activities. Patient acknowledged need to walk more and stated "I will try to increase my walking program. Patient was out of breath follwoign lungign and squatting activities.    PT Next Visit Plan Progress exercise program, increasing activity tolerance with increased time/reps.         Problem List Patient Active Problem List   Diagnosis Date Noted  . Cancer   . Exertional dyspnea 07/27/2014  . Essential hypertension 07/27/2014  . Occlusion and stenosis of carotid artery without mention of cerebral infarction 11/12/2013  . Coronary atherosclerosis of native coronary artery - multivessel CAD, status post CABG x3 (LIMA-LAD, Left Radial-RI, SVG-RCA) 08/02/2013  . Fatigue 08/02/2013  . Dizzy spells 07/14/2013  . Orthostatic hypotension 07/14/2013  . Anemia associated with acute blood loss, post op and with chronic disease 07/14/2013  . Obesity (BMI 30-39.9) 05/05/2013  . CAD in native artery - status post CABG x3 (LIMA-LAD, Left Radial-Ramus Intermedius, SVG-RCA 05/05/2013    Class: Diagnosis of  . Chronic anticoagulation- Coumadin 04/10/2013  . S/P CABG x 3  LIMA-LAD, free LRA to RI, SVG-RCA  - 04/09/13 04/09/2013  . Angina, class III - cath 04/07/13- 3V CAD 04/02/2013    Class: Hospitalized for  . Heart palpitations 04/02/2013  . Claudication of calf muscles 04/02/2013  . Hyperlipidemia with target LDL less than 70   . Invasive ductal carcinoma of right breast, stage 2- 2012 s/p chemo and mastectomy 04/27/2011  . Hodgkin's [id number] 04/27/2011  . Known RICA occlusion (CVA '07) partial LICA stenosis (30-16%)  Jan 2014 04/27/2011  . Occlusion and stenosis of carotid artery with cerebral infarction; Known RICA occlusion (CVA '07), partial LICA stenosis (01-09%) 08/02/2006   Devona Konig PT DPT [phone]

## 2014-09-07 ENCOUNTER — Encounter: Payer: Self-pay | Admitting: Cardiology

## 2014-09-07 ENCOUNTER — Ambulatory Visit (INDEPENDENT_AMBULATORY_CARE_PROVIDER_SITE_OTHER): Payer: Medicare Other | Admitting: Cardiology

## 2014-09-07 VITALS — BP 109/62 | HR 95 | Ht 75.0 in | Wt 251.2 lb

## 2014-09-07 DIAGNOSIS — E785 Hyperlipidemia, unspecified: Secondary | ICD-10-CM

## 2014-09-07 DIAGNOSIS — I2510 Atherosclerotic heart disease of native coronary artery without angina pectoris: Secondary | ICD-10-CM | POA: Diagnosis not present

## 2014-09-07 DIAGNOSIS — R0609 Other forms of dyspnea: Secondary | ICD-10-CM

## 2014-09-07 DIAGNOSIS — R5383 Other fatigue: Secondary | ICD-10-CM

## 2014-09-07 DIAGNOSIS — E669 Obesity, unspecified: Secondary | ICD-10-CM

## 2014-09-07 DIAGNOSIS — I6529 Occlusion and stenosis of unspecified carotid artery: Secondary | ICD-10-CM | POA: Diagnosis not present

## 2014-09-07 DIAGNOSIS — I10 Essential (primary) hypertension: Secondary | ICD-10-CM | POA: Diagnosis not present

## 2014-09-07 MED ORDER — METOPROLOL TARTRATE 25 MG PO TABS: 12.5000 mg | ORAL_TABLET | Freq: Every day | ORAL | Status: DC

## 2014-09-07 NOTE — Patient Instructions (Signed)
Your physician encouraged you to lose weight for better health as discuss with Dr Ellyn Hack- weight loss of 12.5 lbs  By the next apointment.   Your physician wants you to follow-up in 6 month Dr Ellyn Hack.  You will receive a reminder letter in the mail two months in advance. If you don't receive a letter, please call our office to schedule the follow-up appointment.

## 2014-09-08 ENCOUNTER — Encounter: Payer: Self-pay | Admitting: Genetic Counselor

## 2014-09-08 DIAGNOSIS — Z1589 Genetic susceptibility to other disease: Secondary | ICD-10-CM

## 2014-09-08 DIAGNOSIS — Z1502 Genetic susceptibility to malignant neoplasm of ovary: Secondary | ICD-10-CM

## 2014-09-08 DIAGNOSIS — C50919 Malignant neoplasm of unspecified site of unspecified female breast: Secondary | ICD-10-CM | POA: Insufficient documentation

## 2014-09-08 DIAGNOSIS — Z1509 Genetic susceptibility to other malignant neoplasm: Secondary | ICD-10-CM

## 2014-09-09 ENCOUNTER — Encounter: Payer: Self-pay | Admitting: Cardiology

## 2014-09-09 ENCOUNTER — Ambulatory Visit (HOSPITAL_COMMUNITY)
Admission: RE | Admit: 2014-09-09 | Discharge: 2014-09-09 | Disposition: A | Discharge status: Home / Self Care | Payer: Medicare Other | Source: Ambulatory Visit | Attending: Internal Medicine | Admitting: Internal Medicine

## 2014-09-09 DIAGNOSIS — Z5189 Encounter for other specified aftercare: Secondary | ICD-10-CM | POA: Diagnosis not present

## 2014-09-09 DIAGNOSIS — M6281 Muscle weakness (generalized): Secondary | ICD-10-CM | POA: Diagnosis not present

## 2014-09-09 DIAGNOSIS — R262 Difficulty in walking, not elsewhere classified: Secondary | ICD-10-CM | POA: Diagnosis not present

## 2014-09-09 DIAGNOSIS — R5383 Other fatigue: Secondary | ICD-10-CM | POA: Diagnosis not present

## 2014-09-09 NOTE — Assessment & Plan Note (Signed)
Again with normal Myoview and echocardiogram chart to place a cardiac etiology on CT. He doesn't have significant medication issues. Perhaps a thyroid evaluation. But mostly weight loss and exercise will be the best treatment for him.

## 2014-09-09 NOTE — Progress Notes (Signed)
PCP: [name], MD  Clinic Note: Chief Complaint  Patient presents with  . Follow-up    test results. pt denies chest pain and swelling. pt states that he does experience sob with exertion.      Cancer center @ [facility] monitors Coumadin  . Shortness of Breath  . Coronary Artery Disease    Status post CABG 3   HPI: Ryan [name] is a 63 y.o. male with a PMH below who presents today for essentially one month followup for CAD status post CABG.  I first met him in June of 2014 for exertional dyspnea that was associated with mild discomfort. At that time I referred for cardiac catheterization for symptoms of last 3-4 angina there were 3 unstable/crescendo nature.  He was found to have multivessel CAD and referred for CABG.  He also has some aortic and mitral valve disease as noted below. He has chronic exertional dyspnea, and I had backed off on the beta blocker dose for possible chronotropic incompetence & orthostatic dizziness. Since he finished cardiac rehabilitation, he has not had any routine exercise and has gained ~ 23 pounds.  He notes increasing abdominal girth. The side-viewing is a the circumflex about his "belly". He was seen on October 19 for delayed follow-up. He was complaining of exertional dyspnea and exhaustion that was similar to the symptoms that he had prior to his CABG. He is now here following up after a Myoview stress test that was read as low risk (see below). I also ordered an echocardiogram to evaluate his valvular disease and better assess his cardiac function and pressures.  Past Medical History  Diagnosis Date  . Invasive ductal carcinoma of breast, stage 2 04/27/2011    chemo, mastectomy  . Right carotid artery occlusion 04/27/2011  . Left carotid artery partial occlusion 04/27/2011    CAROTIC DOPPLER 11/2013: < 40% R ICA stenosis, distal waveforms damped - suggests probably intracranial occlusoin, < 27% LICA, normal vertebrals -- no change  . Stroke      2007  . Hypercholesterolemia   . Adie's pupil   . H/O Hodgkin's disease 65  . Thyroid disease   . S/P CABG x 3 04/09/2013    LIMA-LAD, fLRAD-RI, SVG-RCA; Echo 10/29/'15:  mild Conc LVH, no WMA, Gr 1 DD, Mod AoV Sclerosis w/ Mod AI, ~Mild-Mod MS   . CAD (coronary artery disease), native coronary artery 04/09/2013    Dist LM stenosis 70-80% (at trifurcation into LAD, CX, & 2 Ramus branches, 70% mid RCA);; b) Myoview 08/06/14: Low Risk, no Ischemia/infarction  . Essential hypertension   . Mild mitral stenosis by prior echocardiogram 08/06/2014     Noted on echocardiogram  . Lymphoma 1983    Aged 31    Prior Cardiac Evaluation and Past Surgical History: Past Surgical History  Procedure Laterality Date  . Mastectomy  rt  . Lymph node biopsy      left neck  . Coronary artery bypass graft [name] 04/08/2013    Procedure: CORONARY ARTERY BYPASS GRAFTING (CABG);  Surgeon: Ivin Poot, MD;  Location: [place];  Service: Open Heart Surgery;  Laterality: [name];  Times 3 using left internal mammary artery; endoscopically harvested right saphenous vein and left radial artery  . Intraoperative transesophageal echocardiogram [name] 04/08/2013    Procedure: INTRAOPERATIVE TRANSESOPHAGEAL ECHOCARDIOGRAM;  Surgeon: Ivin Poot, MD;  Location: [place];  Service: Open Heart Surgery;  Laterality: [name];  . Radial artery harvest Left 04/08/2013    Procedure: RADIAL  ARTERY HARVEST;  Surgeon: Ivin Poot, MD;  Location: [place];  Service: Open Heart Surgery;  Laterality: Left;  . Nm treadmill myoview ltd  08/06/2014    Exercise 5:07 min, 6.3 METS; symptoms noted or extreme dyspnea and lightheadedness with some chest tightness. No EKG changes. No ischemia or infarction was normal EF.  . Transthoracic echocardiogram  08/06/2014     Normal EF 55-60%. Gr 1 DD - increased LVEDP. Moderate aortic valve thickening suggestive of AoV Sclerosis but not stenosis. Mild AI; MIld MS - mean gradient of 7 mmHg & HR of 122 bpm.    Interval History: Today continues to note having exertional dyspnea and fatigue without really much difference. He really doesn't describe having typical anginal type chest discomfort with rest or exertion. He has become very complacent did not do a very good job with his exercising and is clearly deconditioned. He is brought a lot of weight as well.Marland Kitchen No PND, orthopnea but he does have some mild edema. He denies palpitations or rapid/irregular heartbeats. No lightheadedness or dizziness, wooziness or syncope/near-syncope, TIA/amaurosis fugax symptoms. He is willing to try to start getting back into exercise, but is having a hard time getting motivated.  ROS: A comprehensive was performed. Review of Systems  Constitutional: Negative for weight loss and malaise/fatigue.       Weight gain Decreased energy but not really fatigue  HENT: Negative for nosebleeds.   Eyes: Negative for blurred vision.  Respiratory: Positive for shortness of breath. Negative for cough and wheezing.   Cardiovascular: Negative for claudication.  Gastrointestinal: Negative for heartburn, blood in stool and melena.  Genitourinary: Negative for hematuria.  Musculoskeletal: Negative.   Neurological: Positive for dizziness. Negative for sensory change, speech change, focal weakness, seizures and loss of consciousness.       Occasional vertigo type symptoms with turning his head  Endo/Heme/Allergies: Does not bruise/bleed easily.  Psychiatric/Behavioral: Negative for depression. The patient is nervous/anxious.   All other systems reviewed and are negative.   Current Outpatient Prescriptions on File Prior to Visit  Medication Sig Dispense Refill  . ALPRAZolam (XANAX) 1 MG tablet Take 1 mg by mouth at bedtime as needed for anxiety. Take 0.5 mg by mouth at bedtime as needed for anxiety    . aspirin EC 81 MG tablet Take 81 mg by mouth daily.     . Coconut Oil 1000 MG CAPS Take 1,000 mg by mouth daily.    . Garlic 720 MG  CAPS Take 500 mg by mouth daily.    . Ginkgo Biloba 60 MG CAPS Take 1 capsule by mouth daily.    Marland Kitchen Hawthorne Berry 550 MG CAPS Take 550 mg by mouth at bedtime.    . nitroGLYCERIN (NITROSTAT) 0.4 MG SL tablet Place 1 tablet (0.4 mg total) under the tongue every 5 (five) minutes as needed for chest pain. 25 tablet 3  . Omega-3 Fatty Acids (FISH OIL) 1000 MG CPDR Take 1 each by mouth daily.    . simvastatin (ZOCOR) 40 MG tablet Take 40 mg by mouth at bedtime.     . tamoxifen (NOLVADEX) 20 MG tablet Take 1 tablet (20 mg total) by mouth daily. 30 tablet 5  . warfarin (COUMADIN) 1 MG tablet Take as directed. 60 tablet 3  . warfarin (COUMADIN) 4 MG tablet Take 1 tablet (4 mg total) by mouth daily. 30 tablet 6   No current facility-administered medications on file prior to visit.    ALLERGIES REVIEWED IN EPIC --  No change SOCIAL AND FAMILY HISTORY REVIEWED IN EPIC -- No change  Wt Readings from Last 3 Encounters:  09/07/14 251 lb 3.2 oz (113.944 kg)  08/06/14 255 lb (115.667 kg)  07/31/14 251 lb 1.6 oz (113.898 kg)  07/2013 -  232 lb  PHYSICAL EXAM BP 109/62 mmHg  Pulse 95  Ht 6' 3"  (1.905 m)  Wt 251 lb 3.2 oz (113.944 kg)  BMI 31.40 kg/m2 General appearance: alert, cooperative, appears stated age, no distress and mildly obese - mostly truncal; otherwise relatively healthy appearing. Answers questions appropriately. Well-nourished and well-groomed  Neck: no adenopathy, no carotid bruit and no JVD  Lungs: CTA B, normal percussion bilaterally and non-labored  Heart: RRR, S1& S2 normal, no M/R/G; nondisplaced PMI; well healing scars from his CABG.  Abdomen: soft, non-tender; bowel sounds normal; no masses, no organomegaly; no HJR  Extremities: extremities normal, atraumatic, no cyanosis, and edema ; no significant venous stasis changes or rashes. Wrist site for cath stable.  Pulses: 2+ and symmetric; palpable throughout  Neurologic: Mental status: Alert, oriented, thought content appropriate   Cranial nerves: normal (II-XII grossly intact)    Adult ECG Report - not performed   Recent Labs:  None available. Checked by PCP to    ASSESSMENT / PLAN: Exertional dyspnea Multifactorial. A good portion of it is deconditioning. He did have fatigue prior to his CABG and initially felt better post. But he is now had a Myoview would no is evidence ischemia and an echocardiogram with normal pump function and only mild valvular disease. He is on a very low dose of beta blocker that he only takes a half a tablet at night. He does not have hypertension which were arguing against diastolic heart failure related dyspnea.  I think the main thing he needs to do is start getting back into exercise he needs to lose weight and get into better shape.  Fatigue Again with normal Myoview and echocardiogram chart to place a cardiac etiology on CT. He doesn't have significant medication issues. Perhaps a thyroid evaluation. But mostly weight loss and exercise will be the best treatment for him.  Coronary atherosclerosis of native coronary artery - multivessel CAD, status post CABG x3 (LIMA-LAD, Left Radial-RI, SVG-RCA) On aspirin and low-dose beta blocker with statin. His blood pressure is relatively low, making any other medical options less desirable He had a negative Myoview.  Hyperlipidemia with target LDL less than 70 On high-dose simvastatin. He didn't notice any real change in symptoms with stopping the statin. Labs are followed by PCP.  Essential hypertension Borderline hypotensive today on low-dose beta blocker. No room to add ARB or titrate beta blocker.  Obesity (BMI 30-39.9) I stressed the intermittently the importance of watching his diet and trying to lose weight his goal is to lose 12 pounds by his follow-up visit. He needs to work on exercise and decrease his caloric intake. I had given him pamphlets. Unfortunately I can't say that he seems to be all that engaged in this endeavor  because he laughs about upcoming Christmas holidays    No orders of the defined types were placed in this encounter.   Meds ordered this encounter  Medications  . citalopram (CELEXA) 20 MG tablet    Sig: Take 20 mg by mouth 2 (two) times daily.   Marland Kitchen levothyroxine (SYNTHROID, LEVOTHROID) 75 MCG tablet    Sig: Take 75 mcg by mouth daily.   . metoprolol tartrate (LOPRESSOR) 25 MG tablet    Sig: Take  0.5 tablets (12.5 mg total) by mouth at bedtime.    Dispense:  45 tablet    Refill:  3    Please place on file.    Followup: 6 months   HARDING,DAVID W, M.D., M.S. Interventional Cardiologist   Pager # [phone]

## 2014-09-09 NOTE — Assessment & Plan Note (Signed)
Multifactorial. A good portion of it is deconditioning. He did have fatigue prior to his CABG and initially felt better post. But he is now had a Myoview would no is evidence ischemia and an echocardiogram with normal pump function and only mild valvular disease. He is on a very low dose of beta blocker that he only takes a half a tablet at night. He does not have hypertension which were arguing against diastolic heart failure related dyspnea.  I think the main thing he needs to do is start getting back into exercise he needs to lose weight and get into better shape.

## 2014-09-09 NOTE — Assessment & Plan Note (Signed)
I stressed the intermittently the importance of watching his diet and trying to lose weight his goal is to lose 12 pounds by his follow-up visit. He needs to work on exercise and decrease his caloric intake. I had given him pamphlets. Unfortunately I can't say that he seems to be all that engaged in this endeavor because he laughs about upcoming Christmas holidays

## 2014-09-09 NOTE — Assessment & Plan Note (Signed)
On aspirin and low-dose beta blocker with statin. His blood pressure is relatively low, making any other medical options less desirable He had a negative Myoview.

## 2014-09-09 NOTE — Assessment & Plan Note (Signed)
Borderline hypotensive today on low-dose beta blocker. No room to add ARB or titrate beta blocker.

## 2014-09-09 NOTE — Assessment & Plan Note (Signed)
On high-dose simvastatin. He didn't notice any real change in symptoms with stopping the statin. Labs are followed by PCP.

## 2014-09-09 NOTE — Therapy (Addendum)
[facility] [address] [place], [place], [address] Phone: [phone]   Fax:  [phone]  Physical Therapy Treatment  Patient Details  Name: Ryan [name] MRN: [id number] Date of Birth: 02/20/1951  Encounter Date: 09/09/2014      PT End of Session - 09/09/14 1313    Visit Number 4   Number of Visits 5   Date for PT Re-Evaluation 09/19/14   Authorization Type Medicare and [employer]   Authorization - Visit Number 4   Authorization - Number of Visits 5   PT Start Time 1301   PT Stop Time 1345   PT Time Calculation (min) 44 min   Activity Tolerance Patient tolerated treatment well;Patient limited by fatigue   Behavior During Therapy [facility] for tasks assessed/performed      Past Medical History  Diagnosis Date  . Invasive ductal carcinoma of breast, stage 2 04/27/2011    chemo, mastectomy  . Right carotid artery occlusion 04/27/2011  . Left carotid artery partial occlusion 04/27/2011    CAROTIC DOPPLER 11/2013: < 40% R ICA stenosis, distal waveforms damped - suggests probably intracranial occlusoin, < 88% LICA, normal vertebrals -- no change  . Stroke     2007  . Hypercholesterolemia   . Adie's pupil   . H/O Hodgkin's disease 62  . Thyroid disease   . S/P CABG x 3 04/09/2013    LIMA-LAD, fLRAD-RI, SVG-RCA; Echo 10/29/'15:  mild Conc LVH, no WMA, Gr 1 DD, Mod AoV Sclerosis w/ Mod AI, ~Mild-Mod MS   . CAD (coronary artery disease), native coronary artery 04/09/2013    Dist LM stenosis 70-80% (at trifurcation into LAD, CX, & 2 Ramus branches, 70% mid RCA);; b) Myoview 08/06/14: Low Risk, no Ischemia/infarction  . Essential hypertension 07/27/2014  . Cancer     Lymphoma - age 21 (26)    Past Surgical History  Procedure Laterality Date  . Mastectomy  rt  . Lymph node biopsy      left neck  . Coronary artery bypass graft [name] 04/08/2013    Procedure: CORONARY ARTERY BYPASS GRAFTING (CABG);  Surgeon: Ivin Poot, MD;  Location: [place];  Service:  Open Heart Surgery;  Laterality: [name];  Times 3 using left internal mammary artery; endoscopically harvested right saphenous vein and left radial artery  . Intraoperative transesophageal echocardiogram [name] 04/08/2013    Procedure: INTRAOPERATIVE TRANSESOPHAGEAL ECHOCARDIOGRAM;  Surgeon: Ivin Poot, MD;  Location: [place];  Service: Open Heart Surgery;  Laterality: [name];  . Radial artery harvest Left 04/08/2013    Procedure: RADIAL ARTERY HARVEST;  Surgeon: Ivin Poot, MD;  Location: [place];  Service: Open Heart Surgery;  Laterality: Left;    There were no vitals taken for this visit.  Visit Diagnosis:  Fatigue due to treatment  Difficulty walking  Difficulty walking up hill      Subjective Assessment - 09/09/14 1305    Symptoms Patien tstates he does niot feel nealy as sore today but notes he does feel he is getting stronger though he is not sure by how much. Patient states he has continued to forget to use his pedometer. Notes that he is walking about 58mnutes, but has been unable to walk regularly due to the weather (forces him to go inside instead of walking around his home.    Currently in Pain? No/denies   Pain Score 0-No pain            [facility] Adult PT Treatment/Exercise - 09/09/14 0001  Knee/Hip Exercises: Stretches   Active Hamstring Stretch Limitations 14" Box 10x 3 seconds   Hip Flexor Stretch Limitations 14" 10x 3"   Knee/Hip Exercises: Aerobic   Stationary [address] #5, Lv 5, 11'    Knee/Hip Exercises: Standing   Forward Lunges Limitations Walking Lunges 30'  2x , once with knee high reach, once with overhead reach   Side Lunges Limitations walking lunges 31f each with same side rotation and with opposite side rotation.    Lateral Step Up Limitations 12" 10x   Forward Step Up Limitations 12" 10x   Gait Training 632m walk test            PT Short Term Goals - 09/09/14 1339    PT SHORT TERM GOAL #1   Title Patient will be able to walk [id number] 6mi78malk test to indicate improving endurace   Status On-going   PT SHORT TERM GOAL #2   Title Patient will be fill ot the Fatigue VAS averaging <3/10 indicating patient does not complain of fatigue a significant amount.    Status On-going   PT SHORT TERM GOAL #3   Title Patient will demonstrate a gait speed of >1.2 m/s to indicate ability to walk at community ambulatory status for at least short durations.    Status On-going            Plan - 09/09/14 1337    Clinical Impression Statement Continued education on strengthening and walking activities. patient continues to demosntrate fatigue follwoign exercises but is able to continue exercises despite elevated respiration rate   PT Next Visit Plan Reassess for possible discharge, fill out Faci[facility]pero Geralds  [id number]   Problem List Patient Active Problem List   Diagnosis Date Noted  . 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 Cancer   . Exertional dyspnea 07/27/2014  . Essential hypertension 07/27/2014  . Occlusion and stenosis of carotid artery without mention of cerebral infarction 11/12/2013  . Coronary atherosclerosis of native coronary artery - multivessel CAD, status post CABG x3 (LIMA-LAD, Left Radial-RI, SVG-RCA) 08/02/2013  . Fatigue 08/02/2013  . Dizzy spells 07/14/2013  . Orthostatic hypotension 07/14/2013  . Anemia associated with acute blood loss, post op and with chronic disease 07/14/2013  . Obesity (BMI 30-39.9) 05/05/2013  . CAD in native artery - status post CABG x3 (LIMA-LAD, Left Radial-Ramus Intermedius, SVG-RCA 05/05/2013    Class: Diagnosis of  . Chronic anticoagulation- Coumadin 04/10/2013  . S/P CABG x 3  LIMA-LAD, free LRA to RI, SVG-RCA  - 04/09/13 04/09/2013  . Angina, class III - cath 04/07/13- 3V CAD 04/02/2013    Class: Hospitalized for  . Heart palpitations 04/02/2013  . Claudication of calf muscles 04/02/2013  . Hyperlipidemia with target LDL less  than 70   . Invasive ductal carcinoma of right breast, stage 2- 2012 s/p chemo and mastectomy 04/27/2011  . Hodgkin's [id number]  . Known RICA occlusion (CVA '07) partial LICA stenosis (40-548-25%n 2014 04/27/2011  . Occlusion and stenosis of carotid artery with cerebral infarction; Known RICA occlusion (CVA '07), partial LICA stenosis (40-500-37%/25/2007    Emiel Kielty PT DPT [phone] THERAPY DISCHARGE SUMMARY  Visits from Start of Care: 4  Current functional level related to goals / functional outcomes: Unknown did not return for treatment   Remaining deficits: Unknown did not return for treatment   Education / Equipment: HEP   Plan: Patient  agrees to discharge.  Patient goals were partially met. Patient is being discharged due to not returning since the last visit.  ?????       Rayetta Humphrey, [place] CLT [phone]

## 2014-09-10 ENCOUNTER — Encounter: Payer: Self-pay | Admitting: Genetic Counselor

## 2014-09-10 DIAGNOSIS — Z1379 Encounter for other screening for genetic and chromosomal anomalies: Secondary | ICD-10-CM | POA: Insufficient documentation

## 2014-09-17 ENCOUNTER — Encounter (HOSPITAL_COMMUNITY): Payer: Self-pay | Admitting: Cardiology

## 2014-09-18 ENCOUNTER — Ambulatory Visit (HOSPITAL_COMMUNITY): Payer: Medicare Other | Admitting: Physical Therapy

## 2014-09-25 ENCOUNTER — Encounter (HOSPITAL_COMMUNITY): Payer: Medicare Other | Attending: Internal Medicine

## 2014-09-25 ENCOUNTER — Telehealth (HOSPITAL_COMMUNITY): Payer: Self-pay

## 2014-09-25 ENCOUNTER — Other Ambulatory Visit (HOSPITAL_COMMUNITY): Payer: Self-pay | Admitting: Oncology

## 2014-09-25 DIAGNOSIS — Z7901 Long term (current) use of anticoagulants: Secondary | ICD-10-CM | POA: Insufficient documentation

## 2014-09-25 DIAGNOSIS — C50919 Malignant neoplasm of unspecified site of unspecified female breast: Secondary | ICD-10-CM

## 2014-09-25 DIAGNOSIS — Z803 Family history of malignant neoplasm of breast: Secondary | ICD-10-CM | POA: Diagnosis not present

## 2014-09-25 DIAGNOSIS — Z853 Personal history of malignant neoplasm of breast: Secondary | ICD-10-CM

## 2014-09-25 DIAGNOSIS — Z951 Presence of aortocoronary bypass graft: Secondary | ICD-10-CM | POA: Insufficient documentation

## 2014-09-25 DIAGNOSIS — C50929 Malignant neoplasm of unspecified site of unspecified male breast: Secondary | ICD-10-CM | POA: Diagnosis not present

## 2014-09-25 DIAGNOSIS — C8190 Hodgkin lymphoma, unspecified, unspecified site: Secondary | ICD-10-CM | POA: Diagnosis not present

## 2014-09-25 DIAGNOSIS — Z8041 Family history of malignant neoplasm of ovary: Secondary | ICD-10-CM | POA: Insufficient documentation

## 2014-09-25 LAB — PROTIME-INR
INR: 2.96 — AB (ref 0.00–1.49)
Prothrombin Time: 31.1 seconds — ABNORMAL HIGH (ref 11.6–15.2)

## 2014-09-25 MED ORDER — TAMOXIFEN CITRATE 20 MG PO TABS: 20.0000 mg | ORAL_TABLET | Freq: Every day | ORAL | Status: DC

## 2014-09-25 NOTE — Progress Notes (Signed)
LABS FOR PT/INR

## 2014-09-25 NOTE — Telephone Encounter (Signed)
Wife  instructed that Ryan [name] is to continue coumadin 4mg , 4mg , 3mg  , and repeating and to return for next PT/INR on 10/08/14.  Verbalizes understanding. Requests refill for Tamoxifen, has been out for 2 days. Uses [facility] in [place].

## 2014-09-30 ENCOUNTER — Telehealth: Payer: Self-pay | Admitting: *Deleted

## 2014-09-30 ENCOUNTER — Encounter: Payer: Medicare Other | Admitting: Genetic Counselor

## 2014-09-30 NOTE — Telephone Encounter (Signed)
Received call from Santiago Glad that she will be out of the office sick today.  Called pt and made them aware that we needed to reschedule his genetic appt.  Confirmed 10/05/14 genetic appt w/ pt.

## 2014-10-05 ENCOUNTER — Encounter: Payer: Self-pay | Admitting: Genetic Counselor

## 2014-10-05 ENCOUNTER — Ambulatory Visit (HOSPITAL_BASED_OUTPATIENT_CLINIC_OR_DEPARTMENT_OTHER): Payer: Medicare Other | Admitting: Genetic Counselor

## 2014-10-05 DIAGNOSIS — Z803 Family history of malignant neoplasm of breast: Secondary | ICD-10-CM

## 2014-10-05 DIAGNOSIS — Z853 Personal history of malignant neoplasm of breast: Secondary | ICD-10-CM | POA: Diagnosis not present

## 2014-10-05 DIAGNOSIS — Z315 Encounter for genetic counseling: Secondary | ICD-10-CM

## 2014-10-05 DIAGNOSIS — Z1589 Genetic susceptibility to other disease: Secondary | ICD-10-CM

## 2014-10-05 DIAGNOSIS — C50919 Malignant neoplasm of unspecified site of unspecified female breast: Secondary | ICD-10-CM

## 2014-10-05 DIAGNOSIS — Z8572 Personal history of non-Hodgkin lymphomas: Secondary | ICD-10-CM

## 2014-10-05 DIAGNOSIS — Z1509 Genetic susceptibility to other malignant neoplasm: Principal | ICD-10-CM

## 2014-10-05 DIAGNOSIS — Z1502 Genetic susceptibility to malignant neoplasm of ovary: Principal | ICD-10-CM

## 2014-10-05 NOTE — Progress Notes (Signed)
REFERRING PROVIDER: Redmond School, MD [address] [place], [place] [address]   Buel Ream, [place]  PRIMARY PROVIDER:  [name], MD  PRIMARY REASON FOR VISIT:  1. CHEK2-related breast cancer      HISTORY OF PRESENT ILLNESS:   Mr. Mittman, a 63 y.o. male, was seen to discuss the results of his genetic testing.  Mr. Knutzen underwent genetic testing in November, and was found to have a CHEK2 mutation.  Mr. Sartin presents to clinic today to discuss the possibility of a hereditary predisposition to cancer, genetic testing, and to further clarify his future cancer risks, as well as potential cancer risks for family members.   CANCER HISTORY:    Invasive ductal carcinoma of right breast, stage 2- 2012 s/p chemo and mastectomy   12/14/2010 Initial Diagnosis Right needle core biopsy at 9 o'clock position positive for invasive ductal carcinoma   01/17/2011 Surgery Right simple mastectomy showing a 2.1 cm invasive ductal carcinoma, clear marhins, 0/2 lymph nodes.  ER 96%, PR 84%, Ki-67 19%, Her2 negative.   03/08/2011 -  Chemotherapy Tamoxifen x 5 years    Hodgkin's disease-1983   04/27/2011 Initial Diagnosis Hodgkin's [id number]     HISTORY OF PRESENT ILLNESS: In 1983, at the age of 43, Mr. Suk was diagnosed with Hodgkin's lymphoma. This was treated with radiation.  He was subsequently diagnosed with stage II breast cancer at the age of 34.  Mr. Blank underwent genetic counseling and testing on August 17, 2014 with the Breast/Ovarian cancer panel through [facility].  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    Past Medical History  Diagnosis Date  . Invasive ductal carcinoma of breast, stage 2 04/27/2011    chemo, mastectomy  . Right carotid artery occlusion 04/27/2011  . Left carotid artery partial occlusion 04/27/2011    CAROTIC DOPPLER 11/2013: < 40% R ICA stenosis, distal waveforms damped - suggests probably intracranial occlusoin, < 25% LICA, normal vertebrals  -- no change  . Stroke     2007  . Hypercholesterolemia   . Adie's pupil   . H/O Hodgkin's disease 57  . Thyroid disease   . S/P CABG x 3 04/09/2013    LIMA-LAD, fLRAD-RI, SVG-RCA; Echo 10/29/'15:  mild Conc LVH, no WMA, Gr 1 DD, Mod AoV Sclerosis w/ Mod AI, ~Mild-Mod MS   . CAD (coronary artery disease), native coronary artery 04/09/2013    Dist LM stenosis 70-80% (at trifurcation into LAD, CX, & 2 Ramus branches, 70% mid RCA);; b) Myoview 08/06/14: Low Risk, no Ischemia/infarction  . Essential hypertension   . Mild mitral stenosis by prior echocardiogram 08/06/2014     Noted on echocardiogram  . Lymphoma 1983    Aged 19    Past Surgical History  Procedure Laterality Date  . Mastectomy  rt  . Lymph node biopsy      left neck  . Coronary artery bypass graft [name] 04/08/2013    Procedure: CORONARY ARTERY BYPASS GRAFTING (CABG);  Surgeon: Ivin Poot, MD;  Location: [place];  Service: Open Heart Surgery;  Laterality: [name];  Times 3 using left internal mammary artery; endoscopically harvested right saphenous vein and left radial artery  . Intraoperative transesophageal echocardiogram [name] 04/08/2013    Procedure: INTRAOPERATIVE TRANSESOPHAGEAL ECHOCARDIOGRAM;  Surgeon: Ivin Poot, MD;  Location: [place];  Service: Open Heart Surgery;  Laterality: [name];  . Radial artery harvest Left 04/08/2013    Procedure: RADIAL ARTERY HARVEST;  Surgeon: Ivin Poot, MD;  Location: [place];  Service: Open Heart Surgery;  Laterality: Left;  . Nm treadmill myoview ltd  08/06/2014    Exercise 5:07 min, 6.3 METS; symptoms noted or extreme dyspnea and lightheadedness with some chest tightness. No EKG changes. No ischemia or infarction was normal EF.  . Transthoracic echocardiogram  08/06/2014     Normal EF 55-60%. Gr 1 DD - increased LVEDP. Moderate aortic valve thickening suggestive of AoV Sclerosis but not stenosis. Mild AI; MIld MS - mean gradient of 7 mmHg & HR of 122 bpm.  . Left heart catheterization with  coronary angiogram [name] 04/07/2013    Procedure: LEFT HEART CATHETERIZATION WITH CORONARY ANGIOGRAM;  Surgeon: Leonie Man, MD;  Location: [facility] C[facility] LAB;  Service: Cardiovascular;  Laterality: [name];    History   Social History  . Marital Status: Married    Spouse Name: [name]    Number of Children: 2  . Years of Education: [name]   Social History Main Topics  . Smoking status: Former Smoker -- 1.00 packs/day    Types: Cigarettes    Quit date: 06/09/1982  . Smokeless tobacco: Never Used  . Alcohol Use: No  . Drug Use: No  . Sexual Activity: None   Other Topics Concern  . None   Social History Narrative   Married. Former smoker who quit in 1983.   No routine exercise - gained ~40lb post CABG.     FAMILY HISTORY:  We obtained a detailed, 4-generation family history.  Significant diagnoses are listed below: Family History  Problem Relation Age of Onset  . Alzheimer's disease Mother   . Stroke Mother   . Hypertension Mother   . Hyperlipidemia Mother   . Diabetes Mother   . Hypertension Father   . Diabetes Paternal Grandmother   . Diabetes Paternal Grandfather   . Breast cancer Paternal Aunt   . Ovarian cancer Paternal Aunt    Mr. Bueche is an only child.  His father died at age 53, and had five sisters.  One sister had breast cancer, one sister had ovarian cancer.  No other cancer is known on this family.  Mr. Lobos mother died at age 13.  She had 7 brothers and one sister, none who had cancer. Patient's ancestors are of Caucasian descent. There is no reported Ashkenazi Jewish ancestry. There is no known consanguinity.  GENETIC COUNSELING ASSESSMENT: CONARD ALVIRA is a 63 y.o. male with a personal history of male breast cancer and a CHEK2 common mutation. We, therefore, discussed and recommended the following at today's visit.   DISCUSSION: CHEK2 mutations have been found to be associated with an increased risk of breast and other cancers. The estimated cancer risks vary  widely and may be influenced by family history. Women with a CHEK2 deleterious mutation have approximately a 24% (no family history of breast cancer) to 48% (strong family history of breast cancer) lifetime risk of breast cancer and up to a 25% risk of a second breast cancer. Men may have an increased risk for male breast cancer of about 1%. Men and women may have an increased risk of colon cancer (~10% lifetime risk). Other cancers, including ovarian, uterine and thyroid, have been associated with CHEK2 mutations, but no specific risk has been established.  According to the NCCN guidelines, women with CHEK2 mutations should consider breast MRI's as a part of regular breast cancer screening.  There are no other established management guidelines for individuals with disease causing mutations in CHEK2. However, management options based on other  genes associated with high risks of breast cancer (such as the BRCA1 and BRCA2) may be appropriate to follow.   CANCER SCREENING: Below are the NCCN Practice guidelines for women and men.  However, because the breast cancer risks for women and prostate cancer risks for men may be similar, it is appropriate to consider these high risk management recommendations.  Breast Management Options We reviewed the NCCN practice guidelines (v.2.2015) for breast management for women at an increased risk of breast cancer because of BRCA1 or BRCA2 mutations:   1. Breast awareness (which may include periodic, consistent breast self exam) starting at age 66.  2. Clinical breast exam, every 6-12 months, starting at age 30.  20. Breast screening:  . Age 38-29, annual breast MRI screening (preferred), or individualized based on earliest age of breast cancer onset in family.  . Age 24-75, annual mammogram and breast MRI screening.  . Age >75, management should be considered on an individual basis. 4. Breast MRI.   Male Breast and Prostate Management Options We reviewed the NCCN  practice guidelines (v.2.2015) for male breast and prostate management for men at high risk of male breast and prostate cancer because of BRCA1 or BRCA2 mutations:  1. Breast self-exam training and education starting at age 63.  2. Clinical breast exam, every 6-12 months, starting at age 105 years  64. Consider baseline screening mammogram at age 34 with annual mammograms if gynecomastia or parenchymal/glandular breast density on baseline study.  4. Consider prostate cancer screening starting at age 53 years.   Colon Cancer Management:  Men and women with a deleterious CHEK2 mutation may have up to a 10% lifetime risk for colon cancer. At this time, there are not any specific guidelines or recommendations based on this risk.  Individuals should discuss when to begin screening colonoscopies and how often they should be performed with a gastroenterologist.   FAMILY MEMBERS: It is important that all of Mr. Suthers's relatives (both men and women) know of the presence of this gene mutation.  Women need to know that they may be at increased risk for breast and colon cancers.  Men are at slightly increased risk for breast, prostate and colon cancers.  Genetic testing can sort out who in your family is at risk and who is not.  We would be happy to help meet with and coordinate genetic testing for any relative that is interested.  Mr. Zaremba children are at 50% risk to have inherited the mutation found in him. We recommend they have genetic testing for this same mutation, as identifying the presence of this mutation would allow them to also take advantage of risk-reducing measures.   Our knowledge of cancer risks related to CHEK2 mutations will continue to evolve. We recommended that Mr. Shrader follow up with the genetics clinic annually so we can provide him with the most current information about CHEK2 and cancer risk, as well as with any changes to his family history (new cancer diagnoses, genetic test  results).  Lastly, we encouraged Mr. Veracruz to remain in contact with cancer genetics annually so that we can continuously update the family history and inform him of any changes in cancer genetics and testing that may be of benefit for this family.   Mr.  Meneely questions were answered to his satisfaction today. Our contact information was provided should additional questions or concerns arise. Thank you for the referral and allowing [place] to share in the care of your patient.   Santiago Glad  Merton Border, MS, [facility] Certified Genetic Counselor [email or web address] phone: [phone]  The patient was seen for a total of 45 minutes in face-to-face genetic counseling.  This patient was discussed with Drs. Magrinat, Lindi Adie and/or Burr Medico who agrees with the above.    _______________________________________________________________________ For Office Staff:  Number of people involved in session: 2 Was an Intern/ student involved with case: no

## 2014-10-06 ENCOUNTER — Telehealth: Payer: Self-pay | Admitting: Genetic Counselor

## 2014-10-06 NOTE — Telephone Encounter (Signed)
Left message with names and phone numbers of genetic counselors and their appointment lines for their daughter in [place], [place].

## 2014-10-08 ENCOUNTER — Encounter (HOSPITAL_BASED_OUTPATIENT_CLINIC_OR_DEPARTMENT_OTHER): Payer: Medicare Other

## 2014-10-08 DIAGNOSIS — Z7901 Long term (current) use of anticoagulants: Secondary | ICD-10-CM

## 2014-10-08 DIAGNOSIS — Z8041 Family history of malignant neoplasm of ovary: Secondary | ICD-10-CM | POA: Diagnosis not present

## 2014-10-08 DIAGNOSIS — Z803 Family history of malignant neoplasm of breast: Secondary | ICD-10-CM | POA: Diagnosis not present

## 2014-10-08 DIAGNOSIS — C50929 Malignant neoplasm of unspecified site of unspecified male breast: Secondary | ICD-10-CM | POA: Diagnosis not present

## 2014-10-08 DIAGNOSIS — C8190 Hodgkin lymphoma, unspecified, unspecified site: Secondary | ICD-10-CM | POA: Diagnosis not present

## 2014-10-08 DIAGNOSIS — Z951 Presence of aortocoronary bypass graft: Secondary | ICD-10-CM | POA: Diagnosis not present

## 2014-10-08 LAB — PROTIME-INR
INR: 2.35 — ABNORMAL HIGH (ref 0.00–1.49)
Prothrombin Time: 25.9 seconds — ABNORMAL HIGH (ref 11.6–15.2)

## 2014-10-08 NOTE — Progress Notes (Signed)
Labs for pt/inr

## 2014-10-21 ENCOUNTER — Ambulatory Visit (HOSPITAL_COMMUNITY)
Admission: RE | Admit: 2014-10-21 | Discharge: 2014-10-21 | Disposition: A | Discharge status: Home / Self Care | Payer: Medicare Other | Source: Ambulatory Visit | Attending: Family Medicine | Admitting: Family Medicine

## 2014-10-21 ENCOUNTER — Other Ambulatory Visit (HOSPITAL_COMMUNITY): Payer: Self-pay | Admitting: Family Medicine

## 2014-10-21 DIAGNOSIS — M25511 Pain in right shoulder: Secondary | ICD-10-CM | POA: Diagnosis not present

## 2014-10-21 DIAGNOSIS — E6609 Other obesity due to excess calories: Secondary | ICD-10-CM | POA: Diagnosis not present

## 2014-10-21 DIAGNOSIS — M19011 Primary osteoarthritis, right shoulder: Secondary | ICD-10-CM | POA: Diagnosis not present

## 2014-10-21 DIAGNOSIS — S43421A Sprain of right rotator cuff capsule, initial encounter: Secondary | ICD-10-CM | POA: Diagnosis not present

## 2014-10-21 DIAGNOSIS — Z6832 Body mass index (BMI) 32.0-32.9, adult: Secondary | ICD-10-CM | POA: Diagnosis not present

## 2014-10-22 ENCOUNTER — Encounter (HOSPITAL_COMMUNITY): Payer: Medicare Other | Attending: Internal Medicine

## 2014-10-22 DIAGNOSIS — Z7901 Long term (current) use of anticoagulants: Secondary | ICD-10-CM

## 2014-10-22 DIAGNOSIS — Z853 Personal history of malignant neoplasm of breast: Secondary | ICD-10-CM

## 2014-10-22 DIAGNOSIS — Z8041 Family history of malignant neoplasm of ovary: Secondary | ICD-10-CM | POA: Insufficient documentation

## 2014-10-22 DIAGNOSIS — C50929 Malignant neoplasm of unspecified site of unspecified male breast: Secondary | ICD-10-CM | POA: Diagnosis not present

## 2014-10-22 DIAGNOSIS — Z951 Presence of aortocoronary bypass graft: Secondary | ICD-10-CM | POA: Diagnosis not present

## 2014-10-22 DIAGNOSIS — C8190 Hodgkin lymphoma, unspecified, unspecified site: Secondary | ICD-10-CM | POA: Insufficient documentation

## 2014-10-22 DIAGNOSIS — Z8572 Personal history of non-Hodgkin lymphomas: Secondary | ICD-10-CM

## 2014-10-22 DIAGNOSIS — Z803 Family history of malignant neoplasm of breast: Secondary | ICD-10-CM | POA: Diagnosis not present

## 2014-10-22 LAB — PROTIME-INR
INR: 2.37 — ABNORMAL HIGH (ref 0.00–1.49)
PROTHROMBIN TIME: 26.1 s — AB (ref 11.6–15.2)

## 2014-10-22 NOTE — Progress Notes (Signed)
Labs for pt/inr

## 2014-10-23 ENCOUNTER — Telehealth: Payer: Self-pay | Admitting: Cardiology

## 2014-10-23 NOTE — Telephone Encounter (Signed)
Unable to reach pt or leave a message  

## 2014-10-23 NOTE — Telephone Encounter (Signed)
Spoke with pt wife, okay given for patient to use tramadol and flexiril. Discussed with kristin, patient given the okay to take ibuprofen 400 mg for only 2 to 3 days. Patient wife voiced understanding

## 2014-10-23 NOTE — Telephone Encounter (Signed)
Pt's wife called in stating that the pt injured his rotator cuff and would like to know if there is any pain medication that he can take until he goes in to see the Orthopedic doctor on Tuesday. Please call  Thanks

## 2014-10-27 ENCOUNTER — Encounter: Payer: Self-pay | Admitting: Orthopedic Surgery

## 2014-10-27 ENCOUNTER — Ambulatory Visit (INDEPENDENT_AMBULATORY_CARE_PROVIDER_SITE_OTHER): Payer: Medicare Other | Admitting: Orthopedic Surgery

## 2014-10-27 VITALS — BP 102/78 | Ht 75.0 in | Wt 251.2 lb

## 2014-10-27 DIAGNOSIS — M75101 Unspecified rotator cuff tear or rupture of right shoulder, not specified as traumatic: Secondary | ICD-10-CM | POA: Diagnosis not present

## 2014-10-27 DIAGNOSIS — M25511 Pain in right shoulder: Secondary | ICD-10-CM | POA: Diagnosis not present

## 2014-10-27 NOTE — Progress Notes (Signed)
Patient ID: Ryan [name], male   DOB: 01/03/1951, 64 y.o.   MRN: [id number]  Chief Complaint  Patient presents with  . Shoulder Pain    Right shoulder pain, no known injury    HPI Ryan [name] is a 64 y.o. male.  Presents with right shoulder pain for a week. The patient doesn't note any major trauma but he was working on a chair flipped it over felt a twinge of pain in his anterior right shoulder and then while taken out some rubbish lost control of it externally rotated the arm and felt pain again and since that time he's had progressive loss of motion in the right shoulder with pain although the pain is improved slightly over the last week or so. He did have an episode several months ago of neck pain treated with cyclobenzaprine and tramadol with good result.  He now has primarily stiffness in the right shoulder along with the pain catching which is stabbing and aching. Most significant problem at this point is that he can't move his shoulder. No allergies   HPI  Review of Systems Review of Systems Constitutional symptoms fatigue here nose and throat hearing loss, respiratory shortness of breath, endocrine cold intolerance, psychiatric depression anxiety, neurologic dizziness tremors lightheadedness, allergy seasonal allergy, musculoskeletal joint pain stiff joints right shoulder other review of systems was normal    Past Medical History  Diagnosis Date  . Invasive ductal carcinoma of breast, stage 2 04/27/2011    chemo, mastectomy  . Right carotid artery occlusion 04/27/2011  . Left carotid artery partial occlusion 04/27/2011    CAROTIC DOPPLER 11/2013: < 40% R ICA stenosis, distal waveforms damped - suggests probably intracranial occlusoin, < 40% LICA, normal vertebrals -- no change  . Stroke     2007  . Hypercholesterolemia   . Adie's pupil   . H/O Hodgkin's disease 47  . Thyroid disease   . S/P CABG x 3 04/09/2013    LIMA-LAD, fLRAD-RI, SVG-RCA; Echo 10/29/'15:  mild Conc  LVH, no WMA, Gr 1 DD, Mod AoV Sclerosis w/ Mod AI, ~Mild-Mod MS   . CAD (coronary artery disease), native coronary artery 04/09/2013    Dist LM stenosis 70-80% (at trifurcation into LAD, CX, & 2 Ramus branches, 70% mid RCA);; b) Myoview 08/06/14: Low Risk, no Ischemia/infarction  . Essential hypertension   . Mild mitral stenosis by prior echocardiogram 08/06/2014     Noted on echocardiogram  . Lymphoma 1983    Aged 1    Past Surgical History  Procedure Laterality Date  . Mastectomy  rt  . Lymph node biopsy      left neck  . Coronary artery bypass graft [name] 04/08/2013    Procedure: CORONARY ARTERY BYPASS GRAFTING (CABG);  Surgeon: Ivin Poot, MD;  Location: [place];  Service: Open Heart Surgery;  Laterality: [name];  Times 3 using left internal mammary artery; endoscopically harvested right saphenous vein and left radial artery  . Intraoperative transesophageal echocardiogram [name] 04/08/2013    Procedure: INTRAOPERATIVE TRANSESOPHAGEAL ECHOCARDIOGRAM;  Surgeon: Ivin Poot, MD;  Location: [place];  Service: Open Heart Surgery;  Laterality: [name];  . Radial artery harvest Left 04/08/2013    Procedure: RADIAL ARTERY HARVEST;  Surgeon: Ivin Poot, MD;  Location: [place];  Service: Open Heart Surgery;  Laterality: Left;  . Nm treadmill myoview ltd  08/06/2014    Exercise 5:07 min, 6.3 METS; symptoms noted or extreme dyspnea and lightheadedness with some chest tightness. No EKG  changes. No ischemia or infarction was normal EF.  . Transthoracic echocardiogram  08/06/2014     Normal EF 55-60%. Gr 1 DD - increased LVEDP. Moderate aortic valve thickening suggestive of AoV Sclerosis but not stenosis. Mild AI; MIld MS - mean gradient of 7 mmHg & HR of 122 bpm.  . Left heart catheterization with coronary angiogram [name] 04/07/2013    Procedure: LEFT HEART CATHETERIZATION WITH CORONARY ANGIOGRAM;  Surgeon: Leonie Man, MD;  Location: [facility] CATH LAB;  Service: Cardiovascular;  Laterality: [name];    Family  History  Problem Relation Age of Onset  . Alzheimer's disease Mother   . Stroke Mother   . Hypertension Mother   . Hyperlipidemia Mother   . Diabetes Mother   . Hypertension Father   . Diabetes Paternal Grandmother   . Diabetes Paternal Grandfather   . Breast cancer Paternal Aunt   . Ovarian cancer Paternal Aunt     Social History History  Substance Use Topics  . Smoking status: Former Smoker -- 1.00 packs/day    Types: Cigarettes    Quit date: 06/09/1982  . Smokeless tobacco: Never Used  . Alcohol Use: No    No Known Allergies  Current Outpatient Prescriptions  Medication Sig Dispense Refill  . acetaminophen (TYLENOL) 500 MG tablet Take 500 mg by mouth every 6 (six) hours as needed.    . ALPRAZolam (XANAX) 1 MG tablet Take 1 mg by mouth at bedtime as needed for anxiety. Take 0.5 mg by mouth at bedtime as needed for anxiety    . aspirin EC 81 MG tablet Take 81 mg by mouth daily.     . Calcium Carbonate-Vit D-Min (CALCIUM 1200 PO) Take by mouth.    . citalopram (CELEXA) 20 MG tablet Take 20 mg by mouth 2 (two) times daily.     . Coconut Oil 1000 MG CAPS Take 1,000 mg by mouth daily.    . cyclobenzaprine (FLEXERIL) 10 MG tablet Take 10 mg by mouth 3 (three) times daily as needed for muscle spasms.    . ergocalciferol (VITAMIN D2) 50000 UNITS capsule Take 50,000 Units by mouth once a week.    . Garlic 269 MG CAPS Take 500 mg by mouth daily.    . Ginkgo Biloba 60 MG CAPS Take 1 capsule by mouth daily.    Marland Kitchen Hawthorne Berry 550 MG CAPS Take 550 mg by mouth at bedtime.    Marland Kitchen levothyroxine (SYNTHROID, LEVOTHROID) 75 MCG tablet Take 75 mcg by mouth daily.     . metoprolol tartrate (LOPRESSOR) 25 MG tablet Take 0.5 tablets (12.5 mg total) by mouth at bedtime. 45 tablet 3  . nitroGLYCERIN (NITROSTAT) 0.4 MG SL tablet Place 1 tablet (0.4 mg total) under the tongue every 5 (five) minutes as needed for chest pain. 25 tablet 3  . Omega-3 Fatty Acids (FISH OIL) 1000 MG CPDR Take 1 each by  mouth daily.    . simvastatin (ZOCOR) 40 MG tablet Take 40 mg by mouth at bedtime.     . tamoxifen (NOLVADEX) 20 MG tablet Take 1 tablet (20 mg total) by mouth daily. 30 tablet 5  . traMADol (ULTRAM) 50 MG tablet Take by mouth every 6 (six) hours as needed.    . warfarin (COUMADIN) 1 MG tablet Take as directed. 60 tablet 3  . warfarin (COUMADIN) 4 MG tablet Take 1 tablet (4 mg total) by mouth daily. 30 tablet 6   No current facility-administered medications for this visit.  Physical Exam Blood pressure 102/78, height 6\' 3"  (1.905 m), weight 251 lb 3.2 oz (113.944 kg).  Awake alert and oriented 3 mood and affect normal gait noncontributory general appearance normal Sensory exam normal reflexes 2+ and intact axillary lymph nodes right and left normal cervical lymph nodes right left normal Radial artery right arm normal  Left arm long scar anterior aspect of the left arm from the radial artery removal to do his bypass surgery so he says   Physical Exam Left Shoulder Exam   Tenderness  None  Range of Motion  Normal left shoulder ROM  Muscle Strength  Normal left shoulder strength  Tests  Impingement:   Negative Hawkins:          Negative Cross Arm:      Negative Drop Arm:        Negative Apprehension: Negative Sulcus:            Negative  Comments:  Stability tests are normal impingement tests are normal  Right Shoulder Exam   Tenderness  The patient is experiencing tenderness in the Anterior joint line rotator cuff interval.     Data Reviewed Right shoulder x-ray reviewed. Report below. Independent interpretation before meals joint arthritis mild, greater tuberosity sclerosis mild. FINDINGS: The right humeral head is in normal position. The right glenohumeral joint space is unremarkable. Minimal degenerative change is present involving the right AC joint. No acute abnormality is seen.   IMPRESSION: Minimal degenerative change of the right AC joint. No  acute abnormality.  Assessment    Encounter Diagnoses  Name Primary?  . Right shoulder pain   . Rotator cuff tear, right Yes        Plan    Inject return 1 week recheck shoulder. Codman exercises.   Procedure note the subacromial injection shoulder RIGHT  Verbal consent was obtained to inject the  RIGHT   Shoulder  Timeout was completed to confirm the injection site is a subacromial space of the  RIGHT  shoulder   Medication used Depo-Medrol 40 mg and lidocaine 1% 3 cc  Anesthesia was provided by ethyl chloride  The injection was performed in the RIGHT  posterior subacromial space. After pinning the skin with alcohol and anesthetized the skin with ethyl chloride the subacromial space was injected using a 20-gauge needle. There were no complications  Sterile dressing was applied.

## 2014-11-03 ENCOUNTER — Other Ambulatory Visit (HOSPITAL_COMMUNITY): Payer: Medicare Other

## 2014-11-12 ENCOUNTER — Ambulatory Visit (INDEPENDENT_AMBULATORY_CARE_PROVIDER_SITE_OTHER): Payer: Medicare Other | Admitting: Orthopedic Surgery

## 2014-11-12 ENCOUNTER — Encounter: Payer: Self-pay | Admitting: Orthopedic Surgery

## 2014-11-12 VITALS — BP 132/75 | Ht 75.0 in | Wt 251.2 lb

## 2014-11-12 DIAGNOSIS — M25511 Pain in right shoulder: Secondary | ICD-10-CM

## 2014-11-12 NOTE — Patient Instructions (Signed)
Continue exercises 2 more weeks

## 2014-11-12 NOTE — Progress Notes (Signed)
Chief Complaint  Patient presents with  . Follow-up    2 week recheck right shoulder s/p injection+home exercises   BP 132/75 mmHg  Ht 6\' 3"  (1.905 m)  Wt 251 lb 3.2 oz (113.944 kg)  BMI 31.40 kg/m2  Recheck right shoulder after injection in home exercise program with Codman exercises the patient now has no pain  He had full forward elevation of the shoulder without discomfort  His recommendations now are for continued Codman exercises for 2 weeks call us back if his pain returned. Encounter Diagnosis  Name Primary?  . Right shoulder pain Yes

## 2014-11-18 ENCOUNTER — Other Ambulatory Visit (HOSPITAL_COMMUNITY): Payer: BC Managed Care – PPO

## 2014-11-18 ENCOUNTER — Ambulatory Visit: Payer: BC Managed Care – PPO | Admitting: Family

## 2014-11-19 ENCOUNTER — Encounter (HOSPITAL_COMMUNITY): Payer: Medicare Other | Attending: Internal Medicine

## 2014-11-19 DIAGNOSIS — Z8041 Family history of malignant neoplasm of ovary: Secondary | ICD-10-CM | POA: Diagnosis not present

## 2014-11-19 DIAGNOSIS — C8190 Hodgkin lymphoma, unspecified, unspecified site: Secondary | ICD-10-CM | POA: Insufficient documentation

## 2014-11-19 DIAGNOSIS — Z853 Personal history of malignant neoplasm of breast: Secondary | ICD-10-CM

## 2014-11-19 DIAGNOSIS — Z803 Family history of malignant neoplasm of breast: Secondary | ICD-10-CM | POA: Diagnosis not present

## 2014-11-19 DIAGNOSIS — C50929 Malignant neoplasm of unspecified site of unspecified male breast: Secondary | ICD-10-CM | POA: Insufficient documentation

## 2014-11-19 DIAGNOSIS — Z7901 Long term (current) use of anticoagulants: Secondary | ICD-10-CM | POA: Insufficient documentation

## 2014-11-19 DIAGNOSIS — Z951 Presence of aortocoronary bypass graft: Secondary | ICD-10-CM | POA: Insufficient documentation

## 2014-11-19 LAB — PROTIME-INR
INR: 2.61 — ABNORMAL HIGH (ref 0.00–1.49)
Prothrombin Time: 28.1 seconds — ABNORMAL HIGH (ref 11.6–15.2)

## 2014-11-19 NOTE — Progress Notes (Signed)
LABS FOR PT/INR

## 2014-11-26 ENCOUNTER — Other Ambulatory Visit (HOSPITAL_COMMUNITY): Payer: Medicare Other

## 2014-12-08 ENCOUNTER — Ambulatory Visit: Payer: Self-pay | Admitting: Family

## 2014-12-08 ENCOUNTER — Other Ambulatory Visit (HOSPITAL_COMMUNITY): Payer: Medicare Other

## 2014-12-09 ENCOUNTER — Encounter: Payer: Self-pay | Admitting: Family

## 2014-12-10 ENCOUNTER — Ambulatory Visit (INDEPENDENT_AMBULATORY_CARE_PROVIDER_SITE_OTHER): Payer: Medicare Other | Admitting: Family

## 2014-12-10 ENCOUNTER — Ambulatory Visit (HOSPITAL_COMMUNITY)
Admission: RE | Admit: 2014-12-10 | Discharge: 2014-12-10 | Disposition: A | Discharge status: Home / Self Care | Payer: Medicare Other | Source: Ambulatory Visit | Attending: Family | Admitting: Family

## 2014-12-10 ENCOUNTER — Encounter: Payer: Self-pay | Admitting: Family

## 2014-12-10 VITALS — BP 105/73 | HR 76 | Resp 16 | Ht 75.0 in | Wt 260.0 lb

## 2014-12-10 DIAGNOSIS — I6523 Occlusion and stenosis of bilateral carotid arteries: Secondary | ICD-10-CM | POA: Diagnosis not present

## 2014-12-10 DIAGNOSIS — Z8673 Personal history of transient ischemic attack (TIA), and cerebral infarction without residual deficits: Secondary | ICD-10-CM

## 2014-12-10 DIAGNOSIS — I6522 Occlusion and stenosis of left carotid artery: Secondary | ICD-10-CM

## 2014-12-10 DIAGNOSIS — I6521 Occlusion and stenosis of right carotid artery: Secondary | ICD-10-CM

## 2014-12-10 DIAGNOSIS — Z87891 Personal history of nicotine dependence: Secondary | ICD-10-CM | POA: Insufficient documentation

## 2014-12-10 NOTE — Patient Instructions (Signed)
Stroke Prevention Some medical conditions and behaviors are associated with an increased chance of having a stroke. You may prevent a stroke by making healthy choices and managing medical conditions. HOW CAN I REDUCE MY RISK OF HAVING A STROKE?   Stay physically active. Get at least 30 minutes of activity on most or all days.  Do not smoke. It may also be helpful to avoid exposure to secondhand smoke.  Limit alcohol use. Moderate alcohol use is considered to be:  No more than 2 drinks per day for men.  No more than 1 drink per day for nonpregnant women.  Eat healthy foods. This involves:  Eating 5 or more servings of fruits and vegetables a day.  Making dietary changes that address high blood pressure (hypertension), high cholesterol, diabetes, or obesity.  Manage your cholesterol levels.  Making food choices that are high in fiber and low in saturated fat, trans fat, and cholesterol may control cholesterol levels.  Take any prescribed medicines to control cholesterol as directed by your health care provider.  Manage your diabetes.  Controlling your carbohydrate and sugar intake is recommended to manage diabetes.  Take any prescribed medicines to control diabetes as directed by your health care provider.  Control your hypertension.  Making food choices that are low in salt (sodium), saturated fat, trans fat, and cholesterol is recommended to manage hypertension.  Take any prescribed medicines to control hypertension as directed by your health care provider.  Maintain a healthy weight.  Reducing calorie intake and making food choices that are low in sodium, saturated fat, trans fat, and cholesterol are recommended to manage weight.  Stop drug abuse.  Avoid taking birth control pills.  Talk to your health care provider about the risks of taking birth control pills if you are over 35 years old, smoke, get migraines, or have ever had a blood clot.  Get evaluated for sleep  disorders (sleep apnea).  Talk to your health care provider about getting a sleep evaluation if you snore a lot or have excessive sleepiness.  Take medicines only as directed by your health care provider.  For some people, aspirin or blood thinners (anticoagulants) are helpful in reducing the risk of forming abnormal blood clots that can lead to stroke. If you have the irregular heart rhythm of atrial fibrillation, you should be on a blood thinner unless there is a good reason you cannot take them.  Understand all your medicine instructions.  Make sure that other conditions (such as anemia or atherosclerosis) are addressed. SEEK IMMEDIATE MEDICAL CARE IF:   You have sudden weakness or numbness of the face, arm, or leg, especially on one side of the body.  Your face or eyelid droops to one side.  You have sudden confusion.  You have trouble speaking (aphasia) or understanding.  You have sudden trouble seeing in one or both eyes.  You have sudden trouble walking.  You have dizziness.  You have a loss of balance or coordination.  You have a sudden, severe headache with no known cause.  You have new chest pain or an irregular heartbeat. Any of these symptoms may represent a serious problem that is an emergency. Do not wait to see if the symptoms will go away. Get medical help at once. Call your local emergency services (911 in [place] Do not drive yourself to the hospital. Document Released: 11/02/2004 Document Revised: 02/09/2014 Document Reviewed: 03/28/2013 [employer] Patient Information 2015 [employer]. This information is not intended to replace advice given   to you by your health care provider. Make sure you discuss any questions you have with your health care provider.  

## 2014-12-10 NOTE — Progress Notes (Signed)
Established Carotid Patient   History of Present Illness  Ryan [name] is a 64 y.o. male who patient of Dr. Scot Dock who has a known right internal carotid artery occlusion. He comes in for a follow up carotid duplex scan as we are following the left side closely.  His original right hemispheric stroke was in 2007 and was associated with left-sided weakness, no further stroke or TIA activity since then. He had radiation therapy in 1983 to his chest and neck for Hodgkin's Disease. He was diagnosed with right breast cancer in 2012, treated with surgical excision including lymph nodes.  He has undergone coronary revascularization.  Patient has not had previous carotid artery intervention.  The patient denies New Medical or Surgical History.  Pt Diabetic: borderline Pt smoker: former smoker, quit in 1983  Pt meds include: Statin : Yes ASA: Yes Other anticoagulants/antiplatelets: coumadin is managed by the cancer center at [facility]   Past Medical History  Diagnosis Date  . Invasive ductal carcinoma of breast, stage 2 04/27/2011    chemo, mastectomy  . Right carotid artery occlusion 04/27/2011  . Left carotid artery partial occlusion 04/27/2011    CAROTIC DOPPLER 11/2013: < 40% R ICA stenosis, distal waveforms damped - suggests probably intracranial occlusoin, < 26% LICA, normal vertebrals -- no change  . Stroke     2007  . Hypercholesterolemia   . Adie's pupil   . H/O Hodgkin's disease 41  . Thyroid disease   . S/P CABG x 3 04/09/2013    LIMA-LAD, fLRAD-RI, SVG-RCA; Echo 10/29/'15:  mild Conc LVH, no WMA, Gr 1 DD, Mod AoV Sclerosis w/ Mod AI, ~Mild-Mod MS   . CAD (coronary artery disease), native coronary artery 04/09/2013    Dist LM stenosis 70-80% (at trifurcation into LAD, CX, & 2 Ramus branches, 70% mid RCA);; b) Myoview 08/06/14: Low Risk, no Ischemia/infarction  . Essential hypertension   . Mild mitral stenosis by prior echocardiogram 08/06/2014     Noted on  echocardiogram  . Lymphoma 1983    Aged 13    Social History History  Substance Use Topics  . Smoking status: Former Smoker -- 1.00 packs/day    Types: Cigarettes    Quit date: 06/09/1982  . Smokeless tobacco: Never Used  . Alcohol Use: No    Family History Family History  Problem Relation Age of Onset  . Alzheimer's disease Mother   . Stroke Mother   . Hypertension Mother   . Hyperlipidemia Mother   . Diabetes Mother   . Hypertension Father   . Diabetes Paternal Grandmother   . Diabetes Paternal Grandfather   . Breast cancer Paternal Aunt   . Ovarian cancer Paternal Aunt     Surgical History Past Surgical History  Procedure Laterality Date  . Mastectomy  rt  . Lymph node biopsy      left neck  . Coronary artery bypass graft [name] 04/08/2013    Procedure: CORONARY ARTERY BYPASS GRAFTING (CABG);  Surgeon: Ivin Poot, MD;  Location: [place];  Service: Open Heart Surgery;  Laterality: [name];  Times 3 using left internal mammary artery; endoscopically harvested right saphenous vein and left radial artery  . Intraoperative transesophageal echocardiogram [name] 04/08/2013    Procedure: INTRAOPERATIVE TRANSESOPHAGEAL ECHOCARDIOGRAM;  Surgeon: Ivin Poot, MD;  Location: [place];  Service: Open Heart Surgery;  Laterality: [name];  . Radial artery harvest Left 04/08/2013    Procedure: RADIAL ARTERY HARVEST;  Surgeon: Ivin Poot, MD;  Location: [facility]  OR;  Service: Open Heart Surgery;  Laterality: Left;  . Nm treadmill myoview ltd  08/06/2014    Exercise 5:07 min, 6.3 METS; symptoms noted or extreme dyspnea and lightheadedness with some chest tightness. No EKG changes. No ischemia or infarction was normal EF.  . Transthoracic echocardiogram  08/06/2014     Normal EF 55-60%. Gr 1 DD - increased LVEDP. Moderate aortic valve thickening suggestive of AoV Sclerosis but not stenosis. Mild AI; MIld MS - mean gradient of 7 mmHg & HR of 122 bpm.  . Left heart catheterization with coronary angiogram  [name] 04/07/2013    Procedure: LEFT HEART CATHETERIZATION WITH CORONARY ANGIOGRAM;  Surgeon: Leonie Man, MD;  Location: [facility] CATH LAB;  Service: Cardiovascular;  Laterality: [name];    No Known Allergies  Current Outpatient Prescriptions  Medication Sig Dispense Refill  . acetaminophen (TYLENOL) 500 MG tablet Take 500 mg by mouth every 6 (six) hours as needed.    . ALPRAZolam (XANAX) 1 MG tablet Take 1 mg by mouth at bedtime as needed for anxiety. Take 0.5 mg by mouth at bedtime as needed for anxiety    . aspirin EC 81 MG tablet Take 81 mg by mouth daily.     . Calcium Carbonate-Vit D-Min (CALCIUM 1200 PO) Take by mouth.    . Cholecalciferol (VITAMIN D-3 PO) Take by mouth.    . citalopram (CELEXA) 20 MG tablet Take 20 mg by mouth 2 (two) times daily.     . Coconut Oil 1000 MG CAPS Take 1,000 mg by mouth daily.    . Garlic 371 MG CAPS Take 500 mg by mouth daily.    . Ginkgo Biloba 60 MG CAPS Take 1 capsule by mouth daily.    Marland Kitchen Hawthorne Berry 550 MG CAPS Take 550 mg by mouth at bedtime.    Marland Kitchen levothyroxine (SYNTHROID, LEVOTHROID) 75 MCG tablet Take 75 mcg by mouth daily.     . metoprolol tartrate (LOPRESSOR) 25 MG tablet Take 0.5 tablets (12.5 mg total) by mouth at bedtime. 45 tablet 3  . nitroGLYCERIN (NITROSTAT) 0.4 MG SL tablet Place 1 tablet (0.4 mg total) under the tongue every 5 (five) minutes as needed for chest pain. 25 tablet 3  . Omega-3 Fatty Acids (FISH OIL) 1000 MG CPDR Take 1 each by mouth daily.    . simvastatin (ZOCOR) 40 MG tablet Take 40 mg by mouth at bedtime.     . tamoxifen (NOLVADEX) 20 MG tablet Take 1 tablet (20 mg total) by mouth daily. 30 tablet 5  . warfarin (COUMADIN) 1 MG tablet Take as directed. 60 tablet 3  . warfarin (COUMADIN) 4 MG tablet Take 1 tablet (4 mg total) by mouth daily. 30 tablet 6   No current facility-administered medications for this visit.    Review of Systems : See HPI for pertinent positives and negatives.  Physical Examination  Filed  Vitals:   12/10/14 1151 12/10/14 1154  BP: 108/64 105/73  Pulse: 74 76  Resp:  16  Height:  6\' 3"  (1.905 m)  Weight:  260 lb (117.935 kg)  SpO2:  96%   Body mass index is 32.5 kg/(m^2).  General: WDWN male in NAD GAIT: normal Eyes: Left pupil remains moderately dilated and non eactive to light (Adie's pupil, pt states he had this before his stroke) Pulmonary:  Non-labored  Cardiac: regular Rhythm,  Negative detected murmur.  VASCULAR EXAM Carotid Bruits Right Left   Negative Negative     Radial pulses: 1+ palpable  right, left is not palpable, left brachial is 2+ palpable.                                                                                                                            LE Pulses Right Left       POPLITEAL  not palpable   not palpable       POSTERIOR TIBIAL   palpable    palpable        DORSALIS PEDIS      ANTERIOR TIBIAL  palpable   palpable     Gastrointestinal: soft, nontender, BS WNL, no r/g,  negative palpated masses.  Musculoskeletal: Negative muscle atrophy/wasting. M/S 5/5 throughout, Extremities without ischemic changes.  Neurologic: A&O X 3; Appropriate Affect, Speech is normal CN 2-12 intact, Pain and light touch intact in extremities, Motor exam as listed above.   Non-Invasive Vascular Imaging CAROTID DUPLEX 12/10/2014   CEREBROVASCULAR DUPLEX EVALUATION    INDICATION: Carotid disease    PREVIOUS INTERVENTION(S):     DUPLEX EXAM:     RIGHT  LEFT  Peak Systolic Velocities (cm/s) End Diastolic Velocities (cm/s) Plaque LOCATION Peak Systolic Velocities (cm/s) End Diastolic Velocities (cm/s) Plaque  64 0  CCA PROXIMAL 87 22   64 0  CCA MID 93 18 HT  51 6 HT CCA DISTAL 93 19 HT  104 9 HT ECA 95 9 HT  52 9 HT ICA PROXIMAL 90 23 HT  11 0  ICA MID 106 33   11 0  ICA DISTAL 123 36     1.0 ICA / CCA Ratio (PSV) 0.97  Antegrade Vertebral Flow Antegrade  833 Brachial Systolic Pressure (mmHg) 825  Multiphasic (subclavian artery)  Brachial Artery Waveforms Multiphasic (subclavian artery)    Plaque Morphology:  HM = Homogeneous, HT = Heterogeneous, CP = Calcific Plaque, SP = Smooth Plaque, IP = Irregular Plaque     ADDITIONAL FINDINGS: . No significant stenosis of the bilateral external or common carotid arteries. . Dampened right internal carotid artery flow and possible compensatory left internal carotid artery flow suggests a more distal right internal carotid artery stenosis/occlusion.    IMPRESSION: Doppler velocities suggest less than 40% bilateral proximal internal carotid artery stenoses with atypical flow patterns noted, as described above.    Compared to the previous exam:  No significant change noted when compared to the previous exam on 11/12/13.        Assessment: Ryan [name] is a 64 y.o. male who has a known right internal carotid artery occlusion. He comes in for a follow up carotid duplex scan as we are following the left side closely.  His original right hemispheric stroke was in 2007 and was associated with left-sided weakness, no further stroke or TIA activity since then.  Carotid Duplex results reviewed with Dr. Oneida Alar, right ICA remains occluded, less than 40% stenosis of left ICA.  Plan: Follow-up in 1 year with Carotid Duplex.   I discussed  in depth with the patient the nature of atherosclerosis, and emphasized the importance of maximal medical management including strict control of blood pressure, blood glucose, and lipid levels, obtaining regular exercise, and continued cessation of smoking.  The patient is aware that without maximal medical management the underlying atherosclerotic disease process will progress, limiting the benefit of any interventions. The patient was given information about stroke prevention and what symptoms should prompt the patient to seek immediate medical care. Thank you for allowing [place] to participate in this patient's care.  Clemon Chambers, RN, MSN,  FNP-C [facility] Office: [facility] Physician: Oneida Alar  12/10/2014 12:16 PM

## 2014-12-14 ENCOUNTER — Encounter (HOSPITAL_COMMUNITY): Payer: Medicare Other | Attending: Internal Medicine

## 2014-12-14 ENCOUNTER — Other Ambulatory Visit (HOSPITAL_COMMUNITY): Payer: Self-pay | Admitting: Oncology

## 2014-12-14 DIAGNOSIS — Z951 Presence of aortocoronary bypass graft: Secondary | ICD-10-CM | POA: Insufficient documentation

## 2014-12-14 DIAGNOSIS — Z853 Personal history of malignant neoplasm of breast: Secondary | ICD-10-CM

## 2014-12-14 DIAGNOSIS — Z803 Family history of malignant neoplasm of breast: Secondary | ICD-10-CM | POA: Insufficient documentation

## 2014-12-14 DIAGNOSIS — Z8041 Family history of malignant neoplasm of ovary: Secondary | ICD-10-CM | POA: Insufficient documentation

## 2014-12-14 DIAGNOSIS — C50929 Malignant neoplasm of unspecified site of unspecified male breast: Secondary | ICD-10-CM | POA: Insufficient documentation

## 2014-12-14 DIAGNOSIS — C8190 Hodgkin lymphoma, unspecified, unspecified site: Secondary | ICD-10-CM | POA: Insufficient documentation

## 2014-12-14 DIAGNOSIS — Z7901 Long term (current) use of anticoagulants: Secondary | ICD-10-CM | POA: Insufficient documentation

## 2014-12-14 LAB — PROTIME-INR
INR: 1.73 — ABNORMAL HIGH (ref 0.00–1.49)
Prothrombin Time: 20.4 seconds — ABNORMAL HIGH (ref 11.6–15.2)

## 2014-12-14 NOTE — Progress Notes (Signed)
Patient's wife notified to take Coumadin 4mg  x 7 days then return to 4,4,3. Return in 2 weeks for INR. Appt given. Wife verbalized understanding.

## 2014-12-14 NOTE — Progress Notes (Signed)
LABS FOR PT/INR

## 2014-12-17 ENCOUNTER — Other Ambulatory Visit (HOSPITAL_COMMUNITY): Payer: Medicare Other

## 2014-12-28 ENCOUNTER — Encounter (HOSPITAL_BASED_OUTPATIENT_CLINIC_OR_DEPARTMENT_OTHER): Payer: Medicare Other

## 2014-12-28 DIAGNOSIS — Z7901 Long term (current) use of anticoagulants: Secondary | ICD-10-CM

## 2014-12-28 DIAGNOSIS — Z853 Personal history of malignant neoplasm of breast: Secondary | ICD-10-CM | POA: Diagnosis not present

## 2014-12-28 DIAGNOSIS — Z8572 Personal history of non-Hodgkin lymphomas: Secondary | ICD-10-CM

## 2014-12-28 DIAGNOSIS — Z803 Family history of malignant neoplasm of breast: Secondary | ICD-10-CM | POA: Diagnosis not present

## 2014-12-28 DIAGNOSIS — C50929 Malignant neoplasm of unspecified site of unspecified male breast: Secondary | ICD-10-CM | POA: Diagnosis not present

## 2014-12-28 DIAGNOSIS — C8190 Hodgkin lymphoma, unspecified, unspecified site: Secondary | ICD-10-CM | POA: Diagnosis not present

## 2014-12-28 DIAGNOSIS — Z951 Presence of aortocoronary bypass graft: Secondary | ICD-10-CM | POA: Diagnosis not present

## 2014-12-28 DIAGNOSIS — Z8041 Family history of malignant neoplasm of ovary: Secondary | ICD-10-CM | POA: Diagnosis not present

## 2014-12-28 LAB — PROTIME-INR
INR: 2.15 — ABNORMAL HIGH (ref 0.00–1.49)
Prothrombin Time: 24.2 seconds — ABNORMAL HIGH (ref 11.6–15.2)

## 2014-12-28 NOTE — Progress Notes (Signed)
Labs drawn

## 2015-01-15 DIAGNOSIS — E782 Mixed hyperlipidemia: Secondary | ICD-10-CM | POA: Diagnosis not present

## 2015-01-15 DIAGNOSIS — F419 Anxiety disorder, unspecified: Secondary | ICD-10-CM | POA: Diagnosis not present

## 2015-01-15 DIAGNOSIS — F329 Major depressive disorder, single episode, unspecified: Secondary | ICD-10-CM | POA: Diagnosis not present

## 2015-01-15 DIAGNOSIS — Z6832 Body mass index (BMI) 32.0-32.9, adult: Secondary | ICD-10-CM | POA: Diagnosis not present

## 2015-01-18 ENCOUNTER — Encounter (HOSPITAL_COMMUNITY): Payer: Medicare Other | Attending: Internal Medicine

## 2015-01-18 DIAGNOSIS — Z853 Personal history of malignant neoplasm of breast: Secondary | ICD-10-CM | POA: Diagnosis not present

## 2015-01-18 DIAGNOSIS — Z803 Family history of malignant neoplasm of breast: Secondary | ICD-10-CM | POA: Insufficient documentation

## 2015-01-18 DIAGNOSIS — C50929 Malignant neoplasm of unspecified site of unspecified male breast: Secondary | ICD-10-CM | POA: Diagnosis not present

## 2015-01-18 DIAGNOSIS — Z951 Presence of aortocoronary bypass graft: Secondary | ICD-10-CM | POA: Insufficient documentation

## 2015-01-18 DIAGNOSIS — C8190 Hodgkin lymphoma, unspecified, unspecified site: Secondary | ICD-10-CM | POA: Diagnosis not present

## 2015-01-18 DIAGNOSIS — Z7901 Long term (current) use of anticoagulants: Secondary | ICD-10-CM | POA: Insufficient documentation

## 2015-01-18 DIAGNOSIS — Z8041 Family history of malignant neoplasm of ovary: Secondary | ICD-10-CM | POA: Insufficient documentation

## 2015-01-18 LAB — PROTIME-INR
INR: 2.15 — ABNORMAL HIGH (ref 0.00–1.49)
Prothrombin Time: 24.2 seconds — ABNORMAL HIGH (ref 11.6–15.2)

## 2015-01-18 NOTE — Progress Notes (Signed)
LABS DRAWN

## 2015-01-28 ENCOUNTER — Encounter (HOSPITAL_COMMUNITY): Payer: Self-pay | Admitting: Hematology & Oncology

## 2015-01-28 ENCOUNTER — Ambulatory Visit (HOSPITAL_COMMUNITY): Payer: Medicare Other | Admitting: Hematology & Oncology

## 2015-01-28 ENCOUNTER — Encounter (HOSPITAL_BASED_OUTPATIENT_CLINIC_OR_DEPARTMENT_OTHER): Payer: Medicare Other

## 2015-01-28 ENCOUNTER — Encounter (INDEPENDENT_AMBULATORY_CARE_PROVIDER_SITE_OTHER): Payer: Self-pay | Admitting: *Deleted

## 2015-01-28 ENCOUNTER — Encounter (HOSPITAL_BASED_OUTPATIENT_CLINIC_OR_DEPARTMENT_OTHER): Payer: Medicare Other | Admitting: Hematology & Oncology

## 2015-01-28 VITALS — BP 115/58 | HR 74 | Temp 98.2°F | Resp 18 | Wt 253.6 lb

## 2015-01-28 DIAGNOSIS — Z1589 Genetic susceptibility to other disease: Secondary | ICD-10-CM | POA: Diagnosis not present

## 2015-01-28 DIAGNOSIS — C50929 Malignant neoplasm of unspecified site of unspecified male breast: Secondary | ICD-10-CM | POA: Diagnosis not present

## 2015-01-28 DIAGNOSIS — Z803 Family history of malignant neoplasm of breast: Secondary | ICD-10-CM | POA: Diagnosis not present

## 2015-01-28 DIAGNOSIS — Z170 Estrogen receptor positive status [ER+]: Secondary | ICD-10-CM | POA: Diagnosis not present

## 2015-01-28 DIAGNOSIS — C50921 Malignant neoplasm of unspecified site of right male breast: Secondary | ICD-10-CM | POA: Diagnosis not present

## 2015-01-28 DIAGNOSIS — Z951 Presence of aortocoronary bypass graft: Secondary | ICD-10-CM | POA: Diagnosis not present

## 2015-01-28 DIAGNOSIS — Z7901 Long term (current) use of anticoagulants: Secondary | ICD-10-CM | POA: Diagnosis not present

## 2015-01-28 DIAGNOSIS — C50919 Malignant neoplasm of unspecified site of unspecified female breast: Secondary | ICD-10-CM

## 2015-01-28 DIAGNOSIS — Z1502 Genetic susceptibility to malignant neoplasm of ovary: Principal | ICD-10-CM

## 2015-01-28 DIAGNOSIS — C8190 Hodgkin lymphoma, unspecified, unspecified site: Secondary | ICD-10-CM

## 2015-01-28 DIAGNOSIS — Z8041 Family history of malignant neoplasm of ovary: Secondary | ICD-10-CM | POA: Diagnosis not present

## 2015-01-28 DIAGNOSIS — Z1509 Genetic susceptibility to other malignant neoplasm: Principal | ICD-10-CM

## 2015-01-28 DIAGNOSIS — C50911 Malignant neoplasm of unspecified site of right female breast: Secondary | ICD-10-CM

## 2015-01-28 LAB — COMPREHENSIVE METABOLIC PANEL
ALBUMIN: 3.8 g/dL (ref 3.5–5.2)
ALT: 26 U/L (ref 0–53)
ANION GAP: 6 (ref 5–15)
AST: 28 U/L (ref 0–37)
Alkaline Phosphatase: 37 U/L — ABNORMAL LOW (ref 39–117)
BILIRUBIN TOTAL: 0.6 mg/dL (ref 0.3–1.2)
BUN: 16 mg/dL (ref 6–23)
CHLORIDE: 106 mmol/L (ref 96–112)
CO2: 28 mmol/L (ref 19–32)
Calcium: 8.8 mg/dL (ref 8.4–10.5)
Creatinine, Ser: 1.49 mg/dL — ABNORMAL HIGH (ref 0.50–1.35)
GFR calc Af Amer: 55 mL/min — ABNORMAL LOW (ref >90)
GFR, EST NON AFRICAN AMERICAN: 48 mL/min — AB (ref >90)
Glucose, Bld: 136 mg/dL — ABNORMAL HIGH (ref 70–99)
POTASSIUM: 4.5 mmol/L (ref 3.5–5.1)
Sodium: 140 mmol/L (ref 135–145)
Total Protein: 6.5 g/dL (ref 6.0–8.3)

## 2015-01-28 LAB — CBC WITH DIFFERENTIAL/PLATELET
Basophils Absolute: 0.1 10*3/uL (ref 0.0–0.1)
Basophils Relative: 1 % (ref 0–1)
Eosinophils Absolute: 0.3 10*3/uL (ref 0.0–0.7)
Eosinophils Relative: 4 % (ref 0–5)
HEMATOCRIT: 41.7 % (ref 39.0–52.0)
Hemoglobin: 13.3 g/dL (ref 13.0–17.0)
Lymphocytes Relative: 26 % (ref 12–46)
Lymphs Abs: 2.2 10*3/uL (ref 0.7–4.0)
MCH: 30 pg (ref 26.0–34.0)
MCHC: 31.9 g/dL (ref 30.0–36.0)
MCV: 93.9 fL (ref 78.0–100.0)
MONO ABS: 0.6 10*3/uL (ref 0.1–1.0)
Monocytes Relative: 7 % (ref 3–12)
Neutro Abs: 5.1 10*3/uL (ref 1.7–7.7)
Neutrophils Relative %: 62 % (ref 43–77)
Platelets: 232 10*3/uL (ref 150–400)
RBC: 4.44 MIL/uL (ref 4.22–5.81)
RDW: 13.9 % (ref 11.5–15.5)
WBC: 8.3 10*3/uL (ref 4.0–10.5)

## 2015-01-28 NOTE — Progress Notes (Signed)
Labs drawn

## 2015-01-28 NOTE — Progress Notes (Signed)
Ryan [name]., MD [place] [address]  Stage II [name] the right breast, grade 1, without LV I. ER +96%, PR 4%, Ki-67 marker low at 19%, HER-2/neu nonamplified. Oncotype score was 14. He was placed on tamoxifen 10 mg twice a day which he will take for 5 full years starting 03/08/2011. He is status post right mastectomy in April 2012 at which time he was found to have a 2.1 cm primary, 3 sentinel nodes were negative.  12/14/2010  Initial Diagnosis  Right needle core biopsy at 9 o'clock position positive for invasive ductal carcinoma  01/17/2011  Surgery  Right simple mastectomy showing a 2.1 cm invasive ductal carcinoma, clear marhins, 0/2 lymph nodes. ER 96%, PR 84%, Ki-67 19%, Her2 negative.  03/08/2011 -  Chemotherapy  Tamoxifen x 5 years   Hodgkin's disease-1983  04/27/2011  Initial Diagnosis  Hodgkin's disease-1983    DIAGNOSIS: Invasive ductal carcinoma of right breast, stage 2- 2012 s/p chemo and mastectomy   Staging form: Breast, AJCC 7th Edition     Clinical: Stage IIA (T2, N0, cM0) - Signed by Ryan Cancer, PA on 04/27/2011   SUMMARY OF ONCOLOGIC HISTORY:   Invasive ductal carcinoma of right breast, stage 2- 2012 s/p chemo and mastectomy   12/14/2010 Initial Diagnosis Right needle core biopsy at 9 o'clock position positive for invasive ductal carcinoma   01/17/2011 Surgery Right simple mastectomy showing a 2.1 cm invasive ductal carcinoma, clear marhins, 0/2 lymph nodes.  ER 96%, PR 84%, Ki-67 19%, Her2 negative.   03/08/2011 -  Chemotherapy Tamoxifen x 5 years    Hodgkin's disease-1983   04/27/2011 Initial Diagnosis Hodgkin's [id number]    CURRENT THERAPY: Tamoxifen  INTERVAL HISTORY: Ryan [name] 64 y.o. male returns for follow-up of a stage II ER positive, HER-2 negative carcinoma of the right breast. He underwent mastectomy and is currently on tamoxifen. She has a history of Hodgkin lymphoma treated back in the early  1980s. He has carotid disease and coronary artery disease felt to be secondary to his radiation therapy from his Hodgkin disease.  He is on Coumadin and notes that he was previously on Aggrenox. Aggrenox was started secondary to his carotid artery disease. He states Ryan [name] stopped his Aggrenox because of an interaction between it and tamoxifen. He is wondering if he can be on a different medication other than Coumadin because of the ongoing inconvenience of frequent INRs and his dietary restrictions.  On review of his paper chart, he was started on Doxil thin given his history of right carotid artery occlusion with a small CVA in 2007. Note he also has a partial occlusion of the left internal carotid at 50%. He was seen by Dr. love, neurologist in [place] who recommended Coumadin therapy while on tamoxifen given its procoagulant effects. Recent carotid duplex study performed on 12/10/2014 showed 3% bilateral carotid artery stenosis.  MEDICAL HISTORY: Past Medical History  Diagnosis Date  . Invasive ductal carcinoma of breast, stage 2 04/27/2011    chemo, mastectomy  . Right carotid artery occlusion 04/27/2011  . Left carotid artery partial occlusion 04/27/2011    CAROTIC DOPPLER 11/2013: < 40% R ICA stenosis, distal waveforms damped - suggests probably intracranial occlusoin, < 75% LICA, normal vertebrals -- no change  . Stroke     2007  . Hypercholesterolemia   . Adie's pupil   . H/O Hodgkin's disease 50  . Thyroid disease   . S/P CABG x 3 04/09/2013  LIMA-LAD, fLRAD-RI, SVG-RCA; Echo 10/29/'15:  mild Conc LVH, no WMA, Gr 1 DD, Mod AoV Sclerosis w/ Mod AI, ~Mild-Mod MS   . CAD (coronary artery disease), native coronary artery 04/09/2013    Dist LM stenosis 70-80% (at trifurcation into LAD, CX, & 2 Ramus branches, 70% mid RCA);; b) Myoview 08/06/14: Low Risk, no Ischemia/infarction  . Essential hypertension   . Mild mitral stenosis by prior echocardiogram 08/06/2014     Noted on  echocardiogram  . Lymphoma 1983    Aged 31    has Invasive ductal carcinoma of right breast, stage 2- 2012 s/p chemo and mastectomy; Hodgkin's [id number]; Known RICA occlusion (CVA '07) partial LICA stenosis (02-54%) Jan 2014; Angina, class III - cath 04/07/13- 3V CAD; Hyperlipidemia with target LDL less than 70; Heart palpitations; Claudication of calf muscles; S/P CABG x 3  LIMA-LAD, free LRA to RI, SVG-RCA  - 04/09/13; Chronic anticoagulation- Coumadin; Obesity (BMI 30-39.9); CAD in native artery - status post CABG x3 (LIMA-LAD, Left Radial-Ramus Intermedius, SVG-RCA; Dizzy spells; Orthostatic hypotension; Anemia associated with acute blood loss, post op and with chronic disease; Coronary atherosclerosis of native coronary artery - multivessel CAD, status post CABG x3 (LIMA-LAD, Left Radial-RI, SVG-RCA); Fatigue; Occlusion and stenosis of carotid artery without mention of cerebral infarction; Known RICA occlusion (CVA '07), partial LICA stenosis (27-06%); Exertional dyspnea; Essential hypertension; [name]; Malignant neoplasm of breast associated with mutation in CHEK2 gene; and Genetic testing on his problem list.     has No Known Allergies.  Ryan [name] does not currently have medications on file.  SURGICAL HISTORY: Past Surgical History  Procedure Laterality Date  . Mastectomy  rt  . Lymph node biopsy      left neck  . Coronary artery bypass graft [name] 04/08/2013    Procedure: CORONARY ARTERY BYPASS GRAFTING (CABG);  Surgeon: Ryan Poot, MD;  Location: [facility];  Service: Open Heart Surgery;  Laterality: [name];  Times 3 using left internal mammary artery; endoscopically harvested right saphenous vein and left radial artery  . Intraoperative transesophageal echocardiogram [name] 04/08/2013    Procedure: INTRAOPERATIVE TRANSESOPHAGEAL ECHOCARDIOGRAM;  Surgeon: Ryan Poot, MD;  Location: [facility];  Service: Open Heart Surgery;  Laterality: [name];  . Radial artery harvest Left 04/08/2013    Procedure:  RADIAL ARTERY HARVEST;  Surgeon: Ryan Poot, MD;  Location: [facility];  Service: Open Heart Surgery;  Laterality: Left;  . Nm treadmill myoview ltd  08/06/2014    Exercise 5:07 min, 6.3 METS; symptoms noted or extreme dyspnea and lightheadedness with some chest tightness. No EKG changes. No ischemia or infarction was normal EF.  . Transthoracic echocardiogram  08/06/2014     Normal EF 55-60%. Gr 1 DD - increased LVEDP. Moderate aortic valve thickening suggestive of AoV Sclerosis but not stenosis. Mild AI; MIld MS - mean gradient of 7 mmHg & HR of 122 bpm.  . Left heart catheterization with coronary angiogram [name] 04/07/2013    Procedure: LEFT HEART CATHETERIZATION WITH CORONARY ANGIOGRAM;  Surgeon: Leonie Man, MD;  Location: [facility] CATH LAB;  Service: Cardiovascular;  Laterality: [name];    SOCIAL HISTORY: History   Social History  . Marital Status: Married    Spouse Name: [name]  . Number of Children: 2  . Years of Education: [name]   Occupational History  . Not on file.   Social History Main Topics  . Smoking status: Former Smoker -- 1.00 packs/day    Types: Cigarettes    Quit date: 06/09/1982  .  Smokeless tobacco: Never Used  . Alcohol Use: No  . Drug Use: No  . Sexual Activity: Not on file   Other Topics Concern  . Not on file   Social History Narrative   Married. Former smoker who quit in 1983.   No routine exercise - gained ~40lb post CABG.    FAMILY HISTORY: Family History  Problem Relation Age of Onset  . Alzheimer's disease Mother   . Stroke Mother   . Hypertension Mother   . Hyperlipidemia Mother   . Diabetes Mother   . Hypertension Father   . Diabetes Paternal Grandmother   . Diabetes Paternal Grandfather   . Breast [name] Paternal Aunt   . Ovarian [name] Paternal Aunt     Review of Systems  Constitutional: Negative for fever, chills, weight loss and malaise/fatigue.  HENT: Negative for congestion, hearing loss, nosebleeds, sore throat and tinnitus.   Eyes:  Negative for blurred vision, double vision, pain and discharge.  Respiratory: Negative for cough, hemoptysis, sputum production, shortness of breath and wheezing.   Cardiovascular: Negative for chest pain, palpitations, claudication, leg swelling and PND.  Gastrointestinal: Negative for heartburn, nausea, vomiting, abdominal pain, diarrhea, constipation, blood in stool and melena.  Genitourinary: Negative for dysuria, urgency, frequency and hematuria.  Musculoskeletal: Negative for myalgias, joint pain and falls.  Skin: Negative for itching and rash.  Neurological: Negative for dizziness, tingling, tremors, sensory change, speech change, focal weakness, seizures, loss of consciousness, weakness and headaches.  Endo/Heme/Allergies: Does not bruise/bleed easily.  Psychiatric/Behavioral: Negative for depression, suicidal ideas, memory loss and substance abuse. The patient is not nervous/anxious and does not have insomnia.     PHYSICAL EXAMINATION  ECOG PERFORMANCE STATUS: 0 - Asymptomatic  Filed Vitals:   01/28/15 1000  BP: 115/58  Pulse: 74  Temp: 98.2 F (36.8 C)  Resp: 18    Physical Exam  Constitutional: He is oriented to person, place, and time and well-developed, well-nourished, and in no distress.  HENT:  Head: Normocephalic and atraumatic.  Nose: Nose normal.  Mouth/Throat: Oropharynx is clear and moist. No oropharyngeal exudate.  Eyes: Conjunctivae and EOM are normal. Pupils are equal, round, and reactive to light. Right eye exhibits no discharge. Left eye exhibits no discharge. No scleral icterus.  Neck: Normal range of motion. Neck supple. No tracheal deviation present. No thyromegaly present.  Cardiovascular: Normal rate, regular rhythm and normal heart sounds.  Exam reveals no gallop and no friction rub.   No murmur heard. Pulmonary/Chest: Effort normal and breath sounds normal. He has no wheezes. He has no rales.    Abdominal: Soft. Bowel sounds are normal. He  exhibits no distension and no mass. There is no tenderness. There is no rebound and no guarding.  Musculoskeletal: Normal range of motion. He exhibits no edema.  Lymphadenopathy:    He has no cervical adenopathy.  Neurological: He is alert and oriented to person, place, and time. He has normal reflexes. No cranial nerve deficit. Gait normal. Coordination normal.  Skin: Skin is warm and dry. No rash noted.  Psychiatric: Mood, memory, affect and judgment normal.  Nursing note and vitals reviewed.   LABORATORY DATA:  CBC    Component Value Date/Time   WBC 8.3 01/28/2015 0959   RBC 4.44 01/28/2015 0959   HGB 13.3 01/28/2015 0959   HCT 41.7 01/28/2015 0959   PLT 232 01/28/2015 0959   MCV 93.9 01/28/2015 0959   MCH 30.0 01/28/2015 0959   MCHC 31.9 01/28/2015 0959   RDW  13.9 01/28/2015 0959   LYMPHSABS 2.2 01/28/2015 0959   MONOABS 0.6 01/28/2015 0959   EOSABS 0.3 01/28/2015 0959   BASOSABS 0.1 01/28/2015 0959   CMP     Component Value Date/Time   NA 140 01/28/2015 0959   K 4.5 01/28/2015 0959   CL 106 01/28/2015 0959   CO2 28 01/28/2015 0959   GLUCOSE 136* 01/28/2015 0959   BUN 16 01/28/2015 0959   CREATININE 1.49* 01/28/2015 0959   CREATININE 1.45* 07/14/2013 1639   CALCIUM 8.8 01/28/2015 0959   PROT 6.5 01/28/2015 0959   ALBUMIN 3.8 01/28/2015 0959   AST 28 01/28/2015 0959   ALT 26 01/28/2015 0959   ALKPHOS 37* 01/28/2015 0959   BILITOT 0.6 01/28/2015 0959   GFRNONAA 48* 01/28/2015 0959   GFRAA 55* 01/28/2015 0959      RADIOGRAPHIC STUDIES:  CLINICAL DATA: 64 year old male with prior history of right breast [name] in 2012 post mastectomy. Screening exam on the left breast.  EXAM: DIGITAL DIAGNOSTIC LEFT MAMMOGRAM WITH CAD  COMPARISON: Previous exams.  ACR Breast Density Category b: There are scattered areas of fibroglandular density.  FINDINGS: No suspicious masses or calcifications are seen in the left breast. There is no mammographic evidence  of malignancy in the left breast.  Mammographic images were processed with CAD.  IMPRESSION: No mammographic evidence of malignancy in the left breast.  RECOMMENDATION: Screening mammogram in one year.(Code:[id number]-B-01Y)  I have discussed the findings and recommendations with the patient. Results were also provided in writing at the conclusion of the visit. If applicable, a reminder letter will be sent to the patient regarding the next appointment.  BI-RADS CATEGORY 1: Negative.   Electronically Signed  By: Everlean Alstrom M.D.  On: 08/18/2014 14:09     ASSESSMENT and THERAPY PLAN:   Stage II invasive ductal carcinoma of the right breast, ER positive HER-2 negative status post mastectomy and tamoxifen therapy. Tamoxifen was initiated in May 2012  CHEK2 mutation  Hodgkin lymphoma treated in the early 80s with mantle radiation Coronary artery disease Carotid arterial disease Ongoing anticoagulant therapy given prior history of CVA and current tamoxifen use  I feel it  would be reasonable given the inconvenience of Coumadin in regards to frequent appointments and dietary restrictions to change the patient to Xarelto therapy. Coumadin was recommended by his neurologist after his CVA given that he would be on tamoxifen for 5 years. After the completion of this tamoxifen he may be able to go back on antiplatelet agents such as Plavix, we can work with his cardiologist or refer him to a neurologist in regards to this.  He will be due for a mammogram in November but we will see him back in October for ongoing surveillance and we will schedule it at that appointment.  His history of significant arterial disease is most likely the result of his prior mantle radiation for his Hodgkin lymphoma. He is currently receiving all appropriate care.  I reviewed the results of his genetic counseling with he and his wife. They did not understand the risk to their daughters. I again  reviewed the genetic counselors recommendations. I emphasized to the patient to make sure he undergoes annual prostate screening as well. He has excellent follow-up with his primary care physician.  NOTE: CHEK2 mutations have been found to be associated with an increased risk of breast and other cancers. The estimated [name] risks vary widely and may be influenced by family history. Women with a CHEK2 deleterious mutation have approximately  a 24% (no family history of breast [name]) to 48% (strong family history of breast [name]) lifetime risk of breast [name] and up to a 25% risk of a second breast [name]. Men may have an increased risk for male breast [name] of about 1%. Men and women may have an increased risk of colon [name] (~10% lifetime risk). Other cancers, including ovarian, uterine and thyroid, have been associated with CHEK2 mutations, but no specific risk has been established. According to the NCCN guidelines, women with CHEK2 mutations should consider breast MRI's as a part of regular breast [name] screening. There are no other established management guidelines for individuals with disease causing mutations in CHEK2. However, management options based on other genes associated with high risks of breast [name] (such as the BRCA1 and BRCA2) may be appropriate to follow.   Male Breast and Prostate Management Options We reviewed the NCCN practice guidelines (v.2.2015) for male breast and prostate management for men at high risk of male breast and prostate [name] because of BRCA1 or BRCA2 mutations:  1. Breast self-exam training and education starting at age 69.  2. Clinical breast exam, every 6-12 months, starting at age 3 years  22. Consider baseline screening mammogram at age 70 with annual mammograms if gynecomastia or parenchymal/glandular breast density on baseline study.  4. Consider prostate [name] screening starting at age 6 years.   All questions were answered. The patient  knows to call the clinic with any problems, questions or concerns. We can certainly see the patient much sooner if necessary. This note was electronically signed. Molli Hazard MD 01/28/2015

## 2015-01-28 NOTE — Patient Instructions (Signed)
[place] at [facility] Discharge Instructions  RECOMMENDATIONS MADE BY THE CONSULTANT AND ANY TEST RESULTS WILL BE SENT TO YOUR REFERRING PHYSICIAN.  Exam and discussion by Dr. Whitney Muse She will send a note to your cardiologist to see if you can be switched to Plavix. Continue with coumadin for now. Report any new lumps, bone pain, shortness of breath or other symptoms. Report night sweats, unexplained weight loss, etc. Will refer you back to Dr. Laural [name] for repeat colonoscopy.  Follow-up in 6 months with labs and office visit.  Thank you for choosing [place] at [facility] to provide your oncology and hematology care.  To afford each patient quality time with our provider, please arrive at least 15 minutes before your scheduled appointment time.    You need to re-schedule your appointment should you arrive 10 or more minutes late.  We strive to give you quality time with our providers, and arriving late affects you and other patients whose appointments are after yours.  Also, if you no show three or more times for appointments you may be dismissed from the clinic at the providers discretion.     Again, thank you for choosing [facility].  Our hope is that these requests will decrease the amount of time that you wait before being seen by our physicians.       _____________________________________________________________  Should you have questions after your visit to [facility], please contact our office at [phone] between the hours of 8:30 a.m. and 4:30 p.m.  Voicemails left after 4:30 p.m. will not be returned until the following business day.  For prescription refill requests, have your pharmacy contact our office.

## 2015-01-29 ENCOUNTER — Other Ambulatory Visit (HOSPITAL_COMMUNITY): Payer: Medicare Other

## 2015-01-29 ENCOUNTER — Ambulatory Visit (HOSPITAL_COMMUNITY): Payer: Medicare Other | Admitting: Hematology & Oncology

## 2015-01-29 LAB — CANCER ANTIGEN 27.29: CA 27.29: 19.1 U/mL (ref 0.0–38.6)

## 2015-02-03 ENCOUNTER — Other Ambulatory Visit (INDEPENDENT_AMBULATORY_CARE_PROVIDER_SITE_OTHER): Payer: Self-pay | Admitting: *Deleted

## 2015-02-03 DIAGNOSIS — Z1211 Encounter for screening for malignant neoplasm of colon: Secondary | ICD-10-CM

## 2015-02-11 DIAGNOSIS — N183 Chronic kidney disease, stage 3 (moderate): Secondary | ICD-10-CM | POA: Diagnosis not present

## 2015-02-11 DIAGNOSIS — F419 Anxiety disorder, unspecified: Secondary | ICD-10-CM | POA: Diagnosis not present

## 2015-02-11 DIAGNOSIS — Z0000 Encounter for general adult medical examination without abnormal findings: Secondary | ICD-10-CM | POA: Diagnosis not present

## 2015-02-11 DIAGNOSIS — D519 Vitamin B12 deficiency anemia, unspecified: Secondary | ICD-10-CM | POA: Diagnosis not present

## 2015-02-11 DIAGNOSIS — E559 Vitamin D deficiency, unspecified: Secondary | ICD-10-CM | POA: Diagnosis not present

## 2015-02-11 DIAGNOSIS — Z23 Encounter for immunization: Secondary | ICD-10-CM | POA: Diagnosis not present

## 2015-02-11 DIAGNOSIS — E782 Mixed hyperlipidemia: Secondary | ICD-10-CM | POA: Diagnosis not present

## 2015-02-11 DIAGNOSIS — E6609 Other obesity due to excess calories: Secondary | ICD-10-CM | POA: Diagnosis not present

## 2015-02-11 DIAGNOSIS — R5383 Other fatigue: Secondary | ICD-10-CM | POA: Diagnosis not present

## 2015-02-11 DIAGNOSIS — Z6831 Body mass index (BMI) 31.0-31.9, adult: Secondary | ICD-10-CM | POA: Diagnosis not present

## 2015-02-15 ENCOUNTER — Encounter (HOSPITAL_COMMUNITY): Payer: Medicare Other | Attending: Internal Medicine

## 2015-02-15 DIAGNOSIS — C50929 Malignant neoplasm of unspecified site of unspecified male breast: Secondary | ICD-10-CM | POA: Insufficient documentation

## 2015-02-15 DIAGNOSIS — Z803 Family history of malignant neoplasm of breast: Secondary | ICD-10-CM | POA: Diagnosis not present

## 2015-02-15 DIAGNOSIS — Z8041 Family history of malignant neoplasm of ovary: Secondary | ICD-10-CM | POA: Diagnosis not present

## 2015-02-15 DIAGNOSIS — Z951 Presence of aortocoronary bypass graft: Secondary | ICD-10-CM | POA: Diagnosis not present

## 2015-02-15 DIAGNOSIS — Z7901 Long term (current) use of anticoagulants: Secondary | ICD-10-CM | POA: Insufficient documentation

## 2015-02-15 DIAGNOSIS — C8190 Hodgkin lymphoma, unspecified, unspecified site: Secondary | ICD-10-CM | POA: Diagnosis not present

## 2015-02-15 LAB — PROTIME-INR
INR: 2.17 — ABNORMAL HIGH (ref 0.00–1.49)
PROTHROMBIN TIME: 24.4 s — AB (ref 11.6–15.2)

## 2015-02-15 NOTE — Progress Notes (Signed)
Labs drawn

## 2015-03-10 ENCOUNTER — Other Ambulatory Visit (HOSPITAL_COMMUNITY): Payer: Self-pay | Admitting: Pulmonary Disease

## 2015-03-10 ENCOUNTER — Other Ambulatory Visit (HOSPITAL_COMMUNITY): Payer: Self-pay | Admitting: Respiratory Therapy

## 2015-03-10 DIAGNOSIS — R0602 Shortness of breath: Secondary | ICD-10-CM

## 2015-03-10 DIAGNOSIS — I10 Essential (primary) hypertension: Secondary | ICD-10-CM | POA: Diagnosis not present

## 2015-03-11 ENCOUNTER — Ambulatory Visit (HOSPITAL_COMMUNITY)
Admission: RE | Admit: 2015-03-11 | Discharge: 2015-03-11 | Disposition: A | Discharge status: Home / Self Care | Payer: Medicare Other | Source: Ambulatory Visit | Attending: Pulmonary Disease | Admitting: Pulmonary Disease

## 2015-03-11 ENCOUNTER — Other Ambulatory Visit (HOSPITAL_COMMUNITY): Payer: Self-pay | Admitting: Oncology

## 2015-03-11 ENCOUNTER — Telehealth (INDEPENDENT_AMBULATORY_CARE_PROVIDER_SITE_OTHER): Payer: Self-pay | Admitting: *Deleted

## 2015-03-11 DIAGNOSIS — R918 Other nonspecific abnormal finding of lung field: Secondary | ICD-10-CM | POA: Diagnosis not present

## 2015-03-11 DIAGNOSIS — Z951 Presence of aortocoronary bypass graft: Secondary | ICD-10-CM | POA: Diagnosis not present

## 2015-03-11 DIAGNOSIS — I2510 Atherosclerotic heart disease of native coronary artery without angina pectoris: Secondary | ICD-10-CM

## 2015-03-11 DIAGNOSIS — R0602 Shortness of breath: Secondary | ICD-10-CM | POA: Diagnosis not present

## 2015-03-11 DIAGNOSIS — Z8571 Personal history of Hodgkin lymphoma: Secondary | ICD-10-CM | POA: Diagnosis not present

## 2015-03-11 DIAGNOSIS — Z853 Personal history of malignant neoplasm of breast: Secondary | ICD-10-CM | POA: Insufficient documentation

## 2015-03-11 DIAGNOSIS — I739 Peripheral vascular disease, unspecified: Secondary | ICD-10-CM

## 2015-03-11 DIAGNOSIS — I6521 Occlusion and stenosis of right carotid artery: Secondary | ICD-10-CM

## 2015-03-11 MED ORDER — WARFARIN SODIUM 4 MG PO TABS: 4.0000 mg | ORAL_TABLET | Freq: Every day | ORAL | Status: DC

## 2015-03-11 NOTE — Telephone Encounter (Signed)
Patient needs trilyte 

## 2015-03-12 ENCOUNTER — Ambulatory Visit (HOSPITAL_COMMUNITY)
Admission: RE | Admit: 2015-03-12 | Discharge: 2015-03-12 | Disposition: A | Discharge status: Home / Self Care | Payer: Medicare Other | Source: Ambulatory Visit | Attending: Pulmonary Disease | Admitting: Pulmonary Disease

## 2015-03-12 DIAGNOSIS — R0602 Shortness of breath: Secondary | ICD-10-CM | POA: Insufficient documentation

## 2015-03-12 MED ORDER — ALBUTEROL SULFATE (2.5 MG/3ML) 0.083% IN NEBU
2.5000 mg | INHALATION_SOLUTION | Freq: Once | RESPIRATORY_TRACT | Status: AC
  Administered 2015-03-12: 2.5 mg via RESPIRATORY_TRACT

## 2015-03-12 MED ORDER — PEG 3350-KCL-NA BICARB-NACL 420 G PO SOLR: 4000.0000 mL | Freq: Once | ORAL | Status: DC

## 2015-03-13 LAB — PULMONARY FUNCTION TEST
DL/VA % pred: 79 %
DL/VA: 3.88 ml/min/mmHg/L
DLCO UNC: 19.26 ml/min/mmHg
DLCO unc % pred: 49 %
FEF 25-75 Post: 2.46 L/sec
FEF 25-75 Pre: 1.81 L/sec
FEF2575-%CHANGE-POST: 35 %
FEF2575-%Pred-Post: 77 %
FEF2575-%Pred-Pre: 57 %
FEV1-%Change-Post: 7 %
FEV1-%PRED-PRE: 61 %
FEV1-%Pred-Post: 66 %
FEV1-Post: 2.69 L
FEV1-Pre: 2.51 L
FEV1FVC-%CHANGE-POST: 4 %
FEV1FVC-%PRED-PRE: 100 %
FEV6-%CHANGE-POST: 4 %
FEV6-%Pred-Post: 67 %
FEV6-%Pred-Pre: 64 %
FEV6-Post: 3.48 L
FEV6-Pre: 3.33 L
FEV6FVC-%CHANGE-POST: 1 %
FEV6FVC-%PRED-POST: 105 %
FEV6FVC-%PRED-PRE: 103 %
FVC-%Change-Post: 2 %
FVC-%PRED-PRE: 62 %
FVC-%Pred-Post: 63 %
FVC-POST: 3.49 L
FVC-PRE: 3.39 L
POST FEV6/FVC RATIO: 100 %
Post FEV1/FVC ratio: 77 %
Pre FEV1/FVC ratio: 74 %
Pre FEV6/FVC Ratio: 98 %
RV % PRED: 82 %
RV: 2.17 L
TLC % pred: 67 %
TLC: 5.42 L

## 2015-03-15 ENCOUNTER — Encounter (HOSPITAL_COMMUNITY): Payer: Medicare Other | Attending: Internal Medicine

## 2015-03-15 DIAGNOSIS — Z803 Family history of malignant neoplasm of breast: Secondary | ICD-10-CM | POA: Diagnosis not present

## 2015-03-15 DIAGNOSIS — C50929 Malignant neoplasm of unspecified site of unspecified male breast: Secondary | ICD-10-CM | POA: Diagnosis not present

## 2015-03-15 DIAGNOSIS — Z8041 Family history of malignant neoplasm of ovary: Secondary | ICD-10-CM | POA: Insufficient documentation

## 2015-03-15 DIAGNOSIS — Z7901 Long term (current) use of anticoagulants: Secondary | ICD-10-CM | POA: Insufficient documentation

## 2015-03-15 DIAGNOSIS — Z951 Presence of aortocoronary bypass graft: Secondary | ICD-10-CM | POA: Diagnosis not present

## 2015-03-15 DIAGNOSIS — C50921 Malignant neoplasm of unspecified site of right male breast: Secondary | ICD-10-CM

## 2015-03-15 DIAGNOSIS — C8190 Hodgkin lymphoma, unspecified, unspecified site: Secondary | ICD-10-CM | POA: Diagnosis not present

## 2015-03-15 LAB — PROTIME-INR
INR: 2.36 — AB (ref 0.00–1.49)
Prothrombin Time: 25.5 seconds — ABNORMAL HIGH (ref 11.6–15.2)

## 2015-03-15 NOTE — Progress Notes (Signed)
LABS DRAWN

## 2015-03-17 ENCOUNTER — Other Ambulatory Visit (HOSPITAL_COMMUNITY): Payer: Self-pay | Admitting: Oncology

## 2015-04-06 ENCOUNTER — Telehealth (INDEPENDENT_AMBULATORY_CARE_PROVIDER_SITE_OTHER): Payer: Self-pay | Admitting: *Deleted

## 2015-04-06 NOTE — Telephone Encounter (Signed)
Referring MD: dr Whitney Muse PCP: [facility]   Procedure: tcs  Reason/Indication:  screening  Has patient had this procedure before?  Yes, 10 yrs or more ago  If so, when, by whom and where?    Is there a family history of colon cancer?  no  Who?  What age when diagnosed?    Is patient diabetic?   borderline      Does patient have prosthetic heart valve?  no  Do you have a pacemaker?  no  Has patient ever had endocarditis? no  Has patient had joint replacement within last 12 months?  no  Does patient tend to be constipated or take laxatives? yes  Is patient on Coumadin, Plavix and/or Aspirin? yes  Medications: see epic  Allergies: nkda  Medication Adjustment: coumadin 5 days -- ok per Tom @ [facility]  Procedure date & time: 05/06/15 at 930

## 2015-04-06 NOTE — Telephone Encounter (Signed)
agree

## 2015-04-08 DIAGNOSIS — J700 Acute pulmonary manifestations due to radiation: Secondary | ICD-10-CM | POA: Diagnosis not present

## 2015-04-08 DIAGNOSIS — R0602 Shortness of breath: Secondary | ICD-10-CM | POA: Diagnosis not present

## 2015-04-08 DIAGNOSIS — C8190 Hodgkin lymphoma, unspecified, unspecified site: Secondary | ICD-10-CM | POA: Diagnosis not present

## 2015-04-08 DIAGNOSIS — I2510 Atherosclerotic heart disease of native coronary artery without angina pectoris: Secondary | ICD-10-CM | POA: Diagnosis not present

## 2015-04-14 ENCOUNTER — Encounter (HOSPITAL_COMMUNITY): Payer: Medicare Other | Attending: Internal Medicine

## 2015-04-14 DIAGNOSIS — C50929 Malignant neoplasm of unspecified site of unspecified male breast: Secondary | ICD-10-CM | POA: Insufficient documentation

## 2015-04-14 DIAGNOSIS — C8190 Hodgkin lymphoma, unspecified, unspecified site: Secondary | ICD-10-CM | POA: Insufficient documentation

## 2015-04-14 DIAGNOSIS — C50921 Malignant neoplasm of unspecified site of right male breast: Secondary | ICD-10-CM

## 2015-04-14 DIAGNOSIS — Z8041 Family history of malignant neoplasm of ovary: Secondary | ICD-10-CM | POA: Insufficient documentation

## 2015-04-14 DIAGNOSIS — Z951 Presence of aortocoronary bypass graft: Secondary | ICD-10-CM | POA: Diagnosis not present

## 2015-04-14 DIAGNOSIS — Z7901 Long term (current) use of anticoagulants: Secondary | ICD-10-CM | POA: Diagnosis not present

## 2015-04-14 DIAGNOSIS — Z803 Family history of malignant neoplasm of breast: Secondary | ICD-10-CM | POA: Insufficient documentation

## 2015-04-14 LAB — PROTIME-INR
INR: 2.32 — AB (ref 0.00–1.49)
PROTHROMBIN TIME: 25.2 s — AB (ref 11.6–15.2)

## 2015-04-14 NOTE — Progress Notes (Signed)
Labs drawn

## 2015-04-29 ENCOUNTER — Other Ambulatory Visit (HOSPITAL_COMMUNITY): Payer: Self-pay | Admitting: Oncology

## 2015-04-29 DIAGNOSIS — C50919 Malignant neoplasm of unspecified site of unspecified female breast: Secondary | ICD-10-CM

## 2015-04-29 MED ORDER — TAMOXIFEN CITRATE 20 MG PO TABS: 20.0000 mg | ORAL_TABLET | Freq: Every day | ORAL | Status: DC

## 2015-05-05 ENCOUNTER — Other Ambulatory Visit (HOSPITAL_COMMUNITY): Payer: Self-pay | Admitting: Oncology

## 2015-05-05 DIAGNOSIS — C50919 Malignant neoplasm of unspecified site of unspecified female breast: Secondary | ICD-10-CM

## 2015-05-05 MED ORDER — TAMOXIFEN CITRATE 20 MG PO TABS: 20.0000 mg | ORAL_TABLET | Freq: Every day | ORAL | Status: DC

## 2015-05-06 ENCOUNTER — Encounter (HOSPITAL_COMMUNITY)
Admission: RE | Disposition: A | Discharge status: Home / Self Care | Payer: Self-pay | Source: Ambulatory Visit | Attending: Internal Medicine

## 2015-05-06 ENCOUNTER — Ambulatory Visit (HOSPITAL_COMMUNITY)
Admission: RE | Admit: 2015-05-06 | Discharge: 2015-05-06 | Disposition: A | Discharge status: Home / Self Care | Payer: Medicare Other | Source: Ambulatory Visit | Attending: Internal Medicine | Admitting: Internal Medicine

## 2015-05-06 ENCOUNTER — Encounter (HOSPITAL_COMMUNITY): Payer: Self-pay | Admitting: *Deleted

## 2015-05-06 DIAGNOSIS — D122 Benign neoplasm of ascending colon: Secondary | ICD-10-CM | POA: Diagnosis not present

## 2015-05-06 DIAGNOSIS — Z79899 Other long term (current) drug therapy: Secondary | ICD-10-CM | POA: Diagnosis not present

## 2015-05-06 DIAGNOSIS — Z79810 Long term (current) use of selective estrogen receptor modulators (SERMs): Secondary | ICD-10-CM | POA: Insufficient documentation

## 2015-05-06 DIAGNOSIS — K5730 Diverticulosis of large intestine without perforation or abscess without bleeding: Secondary | ICD-10-CM | POA: Diagnosis not present

## 2015-05-06 DIAGNOSIS — Z8673 Personal history of transient ischemic attack (TIA), and cerebral infarction without residual deficits: Secondary | ICD-10-CM | POA: Diagnosis not present

## 2015-05-06 DIAGNOSIS — Z87891 Personal history of nicotine dependence: Secondary | ICD-10-CM | POA: Diagnosis not present

## 2015-05-06 DIAGNOSIS — Z8571 Personal history of Hodgkin lymphoma: Secondary | ICD-10-CM | POA: Diagnosis not present

## 2015-05-06 DIAGNOSIS — Z1211 Encounter for screening for malignant neoplasm of colon: Secondary | ICD-10-CM | POA: Insufficient documentation

## 2015-05-06 DIAGNOSIS — E079 Disorder of thyroid, unspecified: Secondary | ICD-10-CM | POA: Insufficient documentation

## 2015-05-06 DIAGNOSIS — E780 Pure hypercholesterolemia: Secondary | ICD-10-CM | POA: Insufficient documentation

## 2015-05-06 DIAGNOSIS — Z853 Personal history of malignant neoplasm of breast: Secondary | ICD-10-CM | POA: Diagnosis not present

## 2015-05-06 DIAGNOSIS — Z7901 Long term (current) use of anticoagulants: Secondary | ICD-10-CM | POA: Diagnosis not present

## 2015-05-06 DIAGNOSIS — I050 Rheumatic mitral stenosis: Secondary | ICD-10-CM | POA: Insufficient documentation

## 2015-05-06 DIAGNOSIS — Z7982 Long term (current) use of aspirin: Secondary | ICD-10-CM | POA: Diagnosis not present

## 2015-05-06 DIAGNOSIS — I10 Essential (primary) hypertension: Secondary | ICD-10-CM | POA: Insufficient documentation

## 2015-05-06 DIAGNOSIS — K6389 Other specified diseases of intestine: Secondary | ICD-10-CM | POA: Insufficient documentation

## 2015-05-06 DIAGNOSIS — I2510 Atherosclerotic heart disease of native coronary artery without angina pectoris: Secondary | ICD-10-CM | POA: Diagnosis not present

## 2015-05-06 DIAGNOSIS — Z951 Presence of aortocoronary bypass graft: Secondary | ICD-10-CM | POA: Insufficient documentation

## 2015-05-06 HISTORY — PX: COLONOSCOPY: SHX5424

## 2015-05-06 SURGERY — COLONOSCOPY
Anesthesia: Moderate Sedation

## 2015-05-06 MED ORDER — MEPERIDINE HCL 50 MG/ML IJ SOLN
INTRAMUSCULAR | Status: DC | PRN
  Administered 2015-05-06 (×2): 25 mg via INTRAVENOUS

## 2015-05-06 MED ORDER — MIDAZOLAM HCL 5 MG/5ML IJ SOLN
INTRAMUSCULAR | Status: DC | PRN
Start: 2015-05-06 — End: 2015-05-06
  Administered 2015-05-06 (×5): 2 mg via INTRAVENOUS

## 2015-05-06 MED ORDER — STERILE WATER FOR IRRIGATION IR SOLN
Status: DC | PRN
  Administered 2015-05-06: 09:00:00

## 2015-05-06 MED ORDER — SODIUM CHLORIDE 0.9 % IV SOLN
INTRAVENOUS | Status: DC
  Administered 2015-05-06: 1000 mL via INTRAVENOUS

## 2015-05-06 MED ORDER — MEPERIDINE HCL 50 MG/ML IJ SOLN
INTRAMUSCULAR | Status: AC
  Filled 2015-05-06: qty 1

## 2015-05-06 MED ORDER — MIDAZOLAM HCL 5 MG/5ML IJ SOLN
INTRAMUSCULAR | Status: AC
  Filled 2015-05-06: qty 10

## 2015-05-06 NOTE — Discharge Instructions (Signed)
Resume usual medications including warfarin at usual dose. High fiber diet. Please get INR checked in 7-10 days. No driving for 24 hours. Physician will call with biopsy results. Diverticulosis Diverticulosis is the condition that develops when small pouches (diverticula) form in the wall of your colon. Your colon, or large intestine, is where water is absorbed and stool is formed. The pouches form when the inside layer of your colon pushes through weak spots in the outer layers of your colon. CAUSES  No one knows exactly what causes diverticulosis. RISK FACTORS  Being older than 21. Your risk for this condition increases with age. Diverticulosis is rare in people younger than 40 years. By age 88, almost everyone has it.  Eating a low-fiber diet.  Being frequently constipated.  Being overweight.  Not getting enough exercise.  Smoking.  Taking over-the-counter pain medicines, like aspirin and ibuprofen. SYMPTOMS  Most people with diverticulosis do not have symptoms. DIAGNOSIS  Because diverticulosis often has no symptoms, health care providers often discover the condition during an exam for other colon problems. In many cases, a health care provider will diagnose diverticulosis while using a flexible scope to examine the colon (colonoscopy). TREATMENT  If you have never developed an infection related to diverticulosis, you may not need treatment. If you have had an infection before, treatment may include:  Eating more fruits, vegetables, and grains.  Taking a fiber supplement.  Taking a live bacteria supplement (probiotic).  Taking medicine to relax your colon. HOME CARE INSTRUCTIONS   Drink at least 6-8 glasses of water each day to prevent constipation.  Try not to strain when you have a bowel movement.  Keep all follow-up appointments. If you have had an infection before:  Increase the fiber in your diet as directed by your health care provider or dietitian.  Take a  dietary fiber supplement if your health care provider approves.  Only take medicines as directed by your health care provider. SEEK MEDICAL CARE IF:   You have abdominal pain.  You have bloating.  You have cramps.  You have not gone to the bathroom in 3 days. SEEK IMMEDIATE MEDICAL CARE IF:   Your pain gets worse.  Yourbloating becomes very bad.  You have a fever or chills, and your symptoms suddenly get worse.  You begin vomiting.  You have bowel movements that are bloody or black. MAKE SURE YOU:  Understand these instructions.  Will watch your condition.  Will get help right away if you are not doing well or get worse. Document Released: 06/22/2004 Document Revised: 09/30/2013 Document Reviewed: 08/20/2013 [facility] Patient Information 2015 [place], [place]. This information is not intended to replace advice given to you by your health care provider. Make sure you discuss any questions you have with your health care provider. Colonoscopy, Care After Refer to this sheet in the next few weeks. These instructions provide you with information on caring for yourself after your procedure. Your health care provider may also give you more specific instructions. Your treatment has been planned according to current medical practices, but problems sometimes occur. Call your health care provider if you have any problems or questions after your procedure. WHAT TO EXPECT AFTER THE PROCEDURE  After your procedure, it is typical to have the following:  A small amount of blood in your stool.  Moderate amounts of gas and mild abdominal cramping or bloating. HOME CARE INSTRUCTIONS  Do not drive, operate machinery, or sign important documents for 24 hours.  You may shower  and resume your regular physical activities, but move at a slower pace for the first 24 hours.  Take frequent rest periods for the first 24 hours.  Walk around or put a warm pack on your abdomen to help reduce abdominal  cramping and bloating.  Drink enough fluids to keep your urine clear or pale yellow.  You may resume your normal diet as instructed by your health care provider. Avoid heavy or fried foods that are hard to digest.  Avoid drinking alcohol for 24 hours or as instructed by your health care provider.  Only take over-the-counter or prescription medicines as directed by your health care provider.  If a tissue sample (biopsy) was taken during your procedure:  Do not take aspirin or blood thinners for 7 days, or as instructed by your health care provider.  Do not drink alcohol for 7 days, or as instructed by your health care provider.  Eat soft foods for the first 24 hours. SEEK MEDICAL CARE IF: You have persistent spotting of blood in your stool 2-3 days after the procedure. SEEK IMMEDIATE MEDICAL CARE IF:  You have more than a small spotting of blood in your stool.  You pass large blood clots in your stool.  Your abdomen is swollen (distended).  You have nausea or vomiting.  You have a fever.  You have increasing abdominal pain that is not relieved with medicine. Document Released: 05/09/2004 Document Revised: 07/16/2013 Document Reviewed: 06/02/2013 [facility] Patient Information 2015 [place], [place]. This information is not intended to replace advice given to you by your health care provider. Make sure you discuss any questions you have with your health care provider.

## 2015-05-06 NOTE — Op Note (Signed)
COLONOSCOPY PROCEDURE REPORT  PATIENT:  Ryan [name]  MR#:  [id number] Birthdate:  Feb 12, 1951, 64 y.o., male Endoscopist:  Dr. Rogene Houston, MD Referred By:  Dr. Glo Herring, MD  Procedure Date: 05/06/2015  Procedure:   Colonoscopy  Indications:  Patient is 64 year old Caucasian male who is undergoing average risk screening colonoscopy. Family history is negative for CRC. Firstly history significant for breast carcinoma and Hodgkin's disease and he remains in remission.  Informed Consent:  The procedure and risks were reviewed with the patient and informed consent was obtained.  Medications:  Demerol 50 mg IV Versed 10 mg IV  Description of procedure:  After a digital rectal exam was performed, that colonoscope was advanced from the anus through the rectum and colon to the area of ascending colon. Ileocecal valve seen from a distance but cecum could not be reached despite withdrawn endoscope all the way back to the rectum and starting on her again. As the scope was withdrawn mucosal surfaces were carefully surveyed utilizing scope tip to flexion to facilitate fold flattening as needed. The scope was pulled down into the rectum where a thorough exam including retroflexion was performed.  Findings:   Prep fair to satisfactory. Ileocecal valve seen from distance but cecum could not be reached. 2 small polyps ablated via cold biopsy from ascending colon. Single diverticulum at hepatic flexure along with scattered diverticula at sigmoid colon. Diffuse mucosal pigmentation. Thickened anoderm.  Therapeutic/Diagnostic Maneuvers Performed:  See above  Complications:  None  Cecal Withdrawal Time:  [name] as cecum not reached.  Impression:  Examination performed to ascending colon. Two small polyps ablated via cold biopsy from ascending colon and submitted together. Melanosis coli. Scattered diverticula at sigmoid colon and with one at hepatic flexure.  Recommendations:  Standard  instructions given. Patient will go back on warfarin at usual dose and get INR checked in 7-10 days. I will contact patient with biopsy results and further recommendations.  Enora Trillo U  05/06/2015 10:18 AM  CC: Dr. [name], MD & Dr. Rayne Du ref. provider found

## 2015-05-06 NOTE — H&P (Signed)
Ryan [name] is an 64 y.o. male.   Chief Complaint: Patient is here for colonoscopy. HPI: Patient is 64 year old Caucasian male with multiple medical problems who is here for screening colonoscopy. He has occasional hematochezia and if stool is hard. He denies abdominal pain or diarrhea. Last colonoscopy was over 10 years ago. Personal history significant for breast carcinoma and Hodgkin's disease. He remains in remission. Patient has been off warfarin for 5 days. Family history is negative for CRC.   Past Medical History  Diagnosis Date  . Invasive ductal carcinoma of breast, stage 2 04/27/2011    chemo, mastectomy  . Right carotid artery occlusion 04/27/2011  . Left carotid artery partial occlusion 04/27/2011    CAROTIC DOPPLER 11/2013: < 40% R ICA stenosis, distal waveforms damped - suggests probably intracranial occlusoin, < 74% LICA, normal vertebrals -- no change  . Stroke     2007  . Hypercholesterolemia   . Adie's pupil   . H/O Hodgkin's disease 45  . Thyroid disease   . S/P CABG x 3 04/09/2013    LIMA-LAD, fLRAD-RI, SVG-RCA; Echo 10/29/'15:  mild Conc LVH, no WMA, Gr 1 DD, Mod AoV Sclerosis w/ Mod AI, ~Mild-Mod MS   . CAD (coronary artery disease), native coronary artery 04/09/2013    Dist LM stenosis 70-80% (at trifurcation into LAD, CX, & 2 Ramus branches, 70% mid RCA);; b) Myoview 08/06/14: Low Risk, no Ischemia/infarction  . Essential hypertension   . Mild mitral stenosis by prior echocardiogram 08/06/2014     Noted on echocardiogram  . Lymphoma 1983    Aged 60  . Shortness of breath dyspnea     Past Surgical History  Procedure Laterality Date  . Mastectomy  rt  . Lymph node biopsy      left neck  . Coronary artery bypass graft [name] 04/08/2013    Procedure: CORONARY ARTERY BYPASS GRAFTING (CABG);  Surgeon: Ivin Poot, MD;  Location: [place];  Service: Open Heart Surgery;  Laterality: [name];  Times 3 using left internal mammary artery; endoscopically harvested right  saphenous vein and left radial artery  . Intraoperative transesophageal echocardiogram [name] 04/08/2013    Procedure: INTRAOPERATIVE TRANSESOPHAGEAL ECHOCARDIOGRAM;  Surgeon: Ivin Poot, MD;  Location: [place];  Service: Open Heart Surgery;  Laterality: [name];  . Radial artery harvest Left 04/08/2013    Procedure: RADIAL ARTERY HARVEST;  Surgeon: Ivin Poot, MD;  Location: [place];  Service: Open Heart Surgery;  Laterality: Left;  . Nm treadmill myoview ltd  08/06/2014    Exercise 5:07 min, 6.3 METS; symptoms noted or extreme dyspnea and lightheadedness with some chest tightness. No EKG changes. No ischemia or infarction was normal EF.  . Transthoracic echocardiogram  08/06/2014     Normal EF 55-60%. Gr 1 DD - increased LVEDP. Moderate aortic valve thickening suggestive of AoV Sclerosis but not stenosis. Mild AI; MIld MS - mean gradient of 7 mmHg & HR of 122 bpm.  . Left heart catheterization with coronary angiogram [name] 04/07/2013    Procedure: LEFT HEART CATHETERIZATION WITH CORONARY ANGIOGRAM;  Surgeon: Leonie Man, MD;  Location: [facility] C[facility];  Service: Cardiovascular;  Laterality: [name];    Family History  Problem Relation Age of Onset  . Alzheimer's disease Mother   . Stroke Mother   . Hypertension Mother   . Hyperlipidemia Mother   . Diabetes Mother   . Hypertension Father   . Diabetes Paternal Grandmother   . Diabetes Paternal Grandfather   .  Breast cancer Paternal Aunt   . Ovarian cancer Paternal Aunt    Social History:  reports that he quit smoking about 32 years ago. His smoking use included Cigarettes. He smoked 1.00 pack per day. He has never used smokeless tobacco. He reports that he does not drink alcohol or use illicit drugs.  Allergies: No Known Allergies  Medications Prior to Admission  Medication Sig Dispense Refill  . Albuterol Sulfate (PROAIR RESPICLICK) 505 (90 BASE) MCG/ACT AEPB Inhale 1 puff into the lungs 4 (four) times daily as needed (shortness of breath.).     Marland Kitchen ALPRAZolam (XANAX) 1 MG tablet Take 0.5 mg by mouth at bedtime as needed for anxiety.     Marland Kitchen aspirin EC 81 MG tablet Take 81 mg by mouth daily.     . Calcium Carbonate-Vit D-Min (CALCIUM 1200 PO) Take 1 tablet by mouth daily.     . Cholecalciferol (VITAMIN D-3 PO) Take 1 tablet by mouth.     . citalopram (CELEXA) 20 MG tablet Take 20 mg by mouth 2 (two) times daily.     . Coconut Oil 1000 MG CAPS Take 1,000 mg by mouth daily.    . diphenhydrAMINE (BENADRYL) 25 MG tablet Take 25 mg by mouth daily as needed for allergies.    . Garlic 397 MG CAPS Take 500 mg by mouth daily.    . Ginkgo Biloba 60 MG CAPS Take 1 capsule by mouth daily.    Marland Kitchen Hawthorne Berry 550 MG CAPS Take 550 mg by mouth at bedtime.    Marland Kitchen levothyroxine (SYNTHROID, LEVOTHROID) 75 MCG tablet Take 75 mcg by mouth daily.     . metoprolol tartrate (LOPRESSOR) 25 MG tablet Take 0.5 tablets (12.5 mg total) by mouth at bedtime. 45 tablet 3  . Omega-3 Fatty Acids (FISH OIL) 1000 MG CPDR Take 1 each by mouth daily.    . polyethylene glycol-electrolytes (NULYTELY/GOLYTELY) 420 G solution Take 4,000 mLs by mouth once. 4000 mL 0  . simvastatin (ZOCOR) 40 MG tablet Take 40 mg by mouth at bedtime.     . tamoxifen (NOLVADEX) 20 MG tablet Take 1 tablet (20 mg total) by mouth daily. 30 tablet 5  . Umeclidinium-Vilanterol (ANORO ELLIPTA) 62.5-25 MCG/INH AEPB Inhale 1 puff into the lungs daily.    Marland Kitchen warfarin (COUMADIN) 1 MG tablet Take as directed. 60 tablet 3  . warfarin (COUMADIN) 4 MG tablet Take 1 tablet (4 mg total) by mouth daily. 30 tablet 6  . acetaminophen (TYLENOL) 500 MG tablet Take 500 mg by mouth every 6 (six) hours as needed for mild pain.     . nitroGLYCERIN (NITROSTAT) 0.4 MG SL tablet Place 1 tablet (0.4 mg total) under the tongue every 5 (five) minutes as needed for chest pain. 25 tablet 3    No results found for this or any previous visit (from the past 48 hour(s)). No results found.  ROS  Blood pressure 99/66, pulse 84,  temperature 97.6 F (36.4 C), temperature source Oral, resp. rate 17, height 6\' 3"  (1.905 m), weight 240 lb (108.863 kg), SpO2 100 %. Physical Exam  Constitutional: He appears well-developed and well-nourished.  HENT:  Mouth/Throat: Oropharynx is clear and moist.  Eyes: Conjunctivae are normal. No scleral icterus.  Neck: No thyromegaly present.  Cardiovascular: Normal rate, regular rhythm and normal heart sounds.   No murmur heard. Respiratory: Effort normal and breath sounds normal.  Midline sternal scar  GI: Soft. He exhibits no distension. There is no tenderness.  Small umbilical  hernia  Musculoskeletal: He exhibits no edema.  Lymphadenopathy:    He has no cervical adenopathy.  Neurological: He is alert.  Skin: Skin is warm and dry.     Assessment/Plan Average risk screening colonoscopy.  Jerimey Burridge U 05/06/2015, 9:19 AM

## 2015-05-07 ENCOUNTER — Encounter (HOSPITAL_COMMUNITY): Payer: Self-pay | Admitting: Internal Medicine

## 2015-05-14 ENCOUNTER — Encounter (INDEPENDENT_AMBULATORY_CARE_PROVIDER_SITE_OTHER): Payer: Self-pay | Admitting: *Deleted

## 2015-05-26 ENCOUNTER — Encounter (HOSPITAL_COMMUNITY): Payer: Medicare Other | Attending: Internal Medicine

## 2015-05-26 DIAGNOSIS — Z7901 Long term (current) use of anticoagulants: Secondary | ICD-10-CM | POA: Insufficient documentation

## 2015-05-26 DIAGNOSIS — Z8041 Family history of malignant neoplasm of ovary: Secondary | ICD-10-CM | POA: Insufficient documentation

## 2015-05-26 DIAGNOSIS — Z803 Family history of malignant neoplasm of breast: Secondary | ICD-10-CM | POA: Diagnosis not present

## 2015-05-26 DIAGNOSIS — Z951 Presence of aortocoronary bypass graft: Secondary | ICD-10-CM | POA: Insufficient documentation

## 2015-05-26 DIAGNOSIS — C50929 Malignant neoplasm of unspecified site of unspecified male breast: Secondary | ICD-10-CM | POA: Diagnosis not present

## 2015-05-26 DIAGNOSIS — C8190 Hodgkin lymphoma, unspecified, unspecified site: Secondary | ICD-10-CM | POA: Insufficient documentation

## 2015-05-26 LAB — PROTIME-INR
INR: 2.13 — AB (ref 0.00–1.49)
Prothrombin Time: 23.7 seconds — ABNORMAL HIGH (ref 11.6–15.2)

## 2015-05-26 NOTE — Patient Instructions (Signed)
Patient notified to continue same dose and recheck INR in 1 month. Verbalized understanding(talked to Mrs. Weingarten)

## 2015-05-27 ENCOUNTER — Other Ambulatory Visit (HOSPITAL_COMMUNITY): Payer: Self-pay

## 2015-06-07 DIAGNOSIS — H2511 Age-related nuclear cataract, right eye: Secondary | ICD-10-CM | POA: Diagnosis not present

## 2015-06-07 DIAGNOSIS — H5702 Anisocoria: Secondary | ICD-10-CM | POA: Diagnosis not present

## 2015-06-07 DIAGNOSIS — H2513 Age-related nuclear cataract, bilateral: Secondary | ICD-10-CM | POA: Diagnosis not present

## 2015-06-16 ENCOUNTER — Encounter (HOSPITAL_COMMUNITY)
Admission: RE | Admit: 2015-06-16 | Discharge: 2015-06-16 | Disposition: A | Discharge status: Home / Self Care | Payer: Medicare Other | Source: Ambulatory Visit | Attending: Ophthalmology | Admitting: Ophthalmology

## 2015-06-16 DIAGNOSIS — Z01818 Encounter for other preprocedural examination: Secondary | ICD-10-CM | POA: Insufficient documentation

## 2015-06-16 DIAGNOSIS — H2511 Age-related nuclear cataract, right eye: Secondary | ICD-10-CM | POA: Diagnosis not present

## 2015-06-16 LAB — CBC
HCT: 40.8 % (ref 39.0–52.0)
HEMOGLOBIN: 13.4 g/dL (ref 13.0–17.0)
MCH: 29.8 pg (ref 26.0–34.0)
MCHC: 32.8 g/dL (ref 30.0–36.0)
MCV: 90.7 fL (ref 78.0–100.0)
Platelets: 221 10*3/uL (ref 150–400)
RBC: 4.5 MIL/uL (ref 4.22–5.81)
RDW: 13.7 % (ref 11.5–15.5)
WBC: 8.5 10*3/uL (ref 4.0–10.5)

## 2015-06-16 LAB — BASIC METABOLIC PANEL
ANION GAP: 4 — AB (ref 5–15)
BUN: 12 mg/dL (ref 6–20)
CHLORIDE: 106 mmol/L (ref 101–111)
CO2: 28 mmol/L (ref 22–32)
Calcium: 8.9 mg/dL (ref 8.9–10.3)
Creatinine, Ser: 1.3 mg/dL — ABNORMAL HIGH (ref 0.61–1.24)
GFR calc non Af Amer: 56 mL/min — ABNORMAL LOW (ref >60)
GLUCOSE: 127 mg/dL — AB (ref 65–99)
POTASSIUM: 4.3 mmol/L (ref 3.5–5.1)
Sodium: 138 mmol/L (ref 135–145)

## 2015-06-16 NOTE — Patient Instructions (Signed)
Ryan [name]  06/16/2015     @PREFPERIOPPHARMACY @   Your procedure is scheduled on 06/21/2015  Report to [facility] at 9:00 A.M.  Call this number if you have problems the morning of surgery:  [phone]   Remember:  Do not eat food or drink liquids after midnight.  Take these medicines the morning of surgery with A SIP OF WATER Pro Air (bring with you morning of surgery, as well), Xanax, Celexa, Synthroid, Metoprolol, Tamoxifen   Do not wear jewelry, make-up or nail polish.  Do not wear lotions, powders, or perfumes.  You may wear deodorant.  Do not shave 48 hours prior to surgery.  Men may shave face and neck.  Do not bring valuables to the hospital.  [facility] is not responsible for any belongings or valuables.  Contacts, dentures or bridgework may not be worn into surgery.  Leave your suitcase in the car.  After surgery it may be brought to your room.  For patients admitted to the hospital, discharge time will be determined by your treatment team.  Patients discharged the day of surgery will not be allowed to drive home.    Please read over the following fact sheets that you were given. Anesthesia Post-op Instructions     PATIENT INSTRUCTIONS POST-ANESTHESIA  IMMEDIATELY FOLLOWING SURGERY:  Do not drive or operate machinery for the first twenty four hours after surgery.  Do not make any important decisions for twenty four hours after surgery or while taking narcotic pain medications or sedatives.  If you develop intractable nausea and vomiting or a severe headache please notify your doctor immediately.  FOLLOW-UP:  Please make an appointment with your surgeon as instructed. You do not need to follow up with anesthesia unless specifically instructed to do so.  WOUND CARE INSTRUCTIONS (if applicable):  Keep a dry clean dressing on the anesthesia/puncture wound site if there is drainage.  Once the wound has quit draining you may leave it open to air.  Generally you  should leave the bandage intact for twenty four hours unless there is drainage.  If the epidural site drains for more than 36-48 hours please call the anesthesia department.  QUESTIONS?:  Please feel free to call your physician or the hospital operator if you have any questions, and they will be happy to assist you.      Cataract Surgery  A cataract is a clouding of the lens of the eye. When a lens becomes cloudy, vision is reduced based on the degree and nature of the clouding. Surgery may be needed to improve vision. Surgery removes the cloudy lens and usually replaces it with a substitute lens (intraocular lens, IOL). LET YOUR EYE DOCTOR KNOW ABOUT:  Allergies to food or medicine.  Medicines taken including herbs, eye drops, over-the-counter medicines, and creams.  Use of steroids (by mouth or creams).  Previous problems with anesthetics or numbing medicine.  History of bleeding problems or blood clots.  Previous surgery.  Other health problems, including diabetes and kidney problems.  Possibility of pregnancy, if this applies. RISKS AND COMPLICATIONS  Infection.  Inflammation of the eyeball (endophthalmitis) that can spread to both eyes (sympathetic ophthalmia).  Poor wound healing.  If an IOL is inserted, it can later fall out of proper position. This is very uncommon.  Clouding of the part of your eye that holds an IOL in place. This is called an "after-cataract." These are uncommon but easily treated. BEFORE THE PROCEDURE  Do not eat  or drink anything except small amounts of water for 8 to 12 before your surgery, or as directed by your caregiver.  Unless you are told otherwise, continue any eye drops you have been prescribed.  Talk to your primary caregiver about all other medicines that you take (both prescription and nonprescription). In some cases, you may need to stop or change medicines near the time of your surgery. This is most important if you are taking  blood-thinning medicine.Do not stop medicines unless you are told to do so.  Arrange for someone to drive you to and from the procedure.  Do not put contact lenses in either eye on the day of your surgery. PROCEDURE There is more than one method for safely removing a cataract. Your doctor can explain the differences and help determine which is best for you. Phacoemulsification surgery is the most common form of cataract surgery.  An injection is given behind the eye or eye drops are given to make this a painless procedure.  A small cut (incision) is made on the edge of the clear, dome-shaped surface that covers the front of the eye (cornea).  A tiny probe is painlessly inserted into the eye. This device gives off ultrasound waves that soften and break up the cloudy center of the lens. This makes it easier for the cloudy lens to be removed by suction.  An IOL may be implanted.  The normal lens of the eye is covered by a clear capsule. Part of that capsule is intentionally left in the eye to support the IOL.  Your surgeon may or may not use stitches to close the incision. There are other forms of cataract surgery that require a larger incision and stitches to close the eye. This approach is taken in cases where the doctor feels that the cataract cannot be easily removed using phacoemulsification. AFTER THE PROCEDURE  When an IOL is implanted, it does not need care. It becomes a permanent part of your eye and cannot be seen or felt.  Your doctor will schedule follow-up exams to check on your progress.  Review your other medicines with your doctor to see which can be resumed after surgery.  Use eye drops or take medicine as prescribed by your doctor. Document Released: 09/14/2011 Document Revised: 02/09/2014 Document Reviewed: 09/14/2011 [facility] Patient Information 2015 [place], [place]. This information is not intended to replace advice given to you by your health care provider. Make  sure you discuss any questions you have with your health care provider.

## 2015-06-18 MED ORDER — CYCLOPENTOLATE-PHENYLEPHRINE OP SOLN OPTIME - NO CHARGE
OPHTHALMIC | Status: AC
  Filled 2015-06-18: qty 2

## 2015-06-18 MED ORDER — NEOMYCIN-POLYMYXIN-DEXAMETH 3.5-10000-0.1 OP SUSP
OPHTHALMIC | Status: AC
  Filled 2015-06-18: qty 5

## 2015-06-18 MED ORDER — PHENYLEPHRINE HCL 2.5 % OP SOLN
OPHTHALMIC | Status: AC
  Filled 2015-06-18: qty 15

## 2015-06-18 MED ORDER — LIDOCAINE HCL (PF) 1 % IJ SOLN
INTRAMUSCULAR | Status: AC
  Filled 2015-06-18: qty 2

## 2015-06-18 MED ORDER — TETRACAINE HCL 0.5 % OP SOLN
OPHTHALMIC | Status: AC
  Filled 2015-06-18: qty 2

## 2015-06-18 MED ORDER — LIDOCAINE HCL 3.5 % OP GEL
OPHTHALMIC | Status: AC
  Filled 2015-06-18: qty 1

## 2015-06-21 ENCOUNTER — Ambulatory Visit (HOSPITAL_COMMUNITY): Payer: Medicare Other | Admitting: Anesthesiology

## 2015-06-21 ENCOUNTER — Encounter (HOSPITAL_COMMUNITY): Payer: Self-pay

## 2015-06-21 ENCOUNTER — Encounter (HOSPITAL_COMMUNITY)
Admission: RE | Disposition: A | Discharge status: Home / Self Care | Payer: Self-pay | Source: Ambulatory Visit | Attending: Ophthalmology

## 2015-06-21 ENCOUNTER — Ambulatory Visit (HOSPITAL_COMMUNITY)
Admission: RE | Admit: 2015-06-21 | Discharge: 2015-06-21 | Disposition: A | Discharge status: Home / Self Care | Payer: Medicare Other | Source: Ambulatory Visit | Attending: Ophthalmology | Admitting: Ophthalmology

## 2015-06-21 DIAGNOSIS — I10 Essential (primary) hypertension: Secondary | ICD-10-CM | POA: Insufficient documentation

## 2015-06-21 DIAGNOSIS — H269 Unspecified cataract: Secondary | ICD-10-CM | POA: Diagnosis not present

## 2015-06-21 DIAGNOSIS — Z7982 Long term (current) use of aspirin: Secondary | ICD-10-CM | POA: Insufficient documentation

## 2015-06-21 DIAGNOSIS — E079 Disorder of thyroid, unspecified: Secondary | ICD-10-CM | POA: Diagnosis not present

## 2015-06-21 DIAGNOSIS — Z79899 Other long term (current) drug therapy: Secondary | ICD-10-CM | POA: Diagnosis not present

## 2015-06-21 DIAGNOSIS — I2510 Atherosclerotic heart disease of native coronary artery without angina pectoris: Secondary | ICD-10-CM | POA: Diagnosis not present

## 2015-06-21 DIAGNOSIS — Z951 Presence of aortocoronary bypass graft: Secondary | ICD-10-CM | POA: Diagnosis not present

## 2015-06-21 DIAGNOSIS — E780 Pure hypercholesterolemia: Secondary | ICD-10-CM | POA: Diagnosis not present

## 2015-06-21 DIAGNOSIS — Z87891 Personal history of nicotine dependence: Secondary | ICD-10-CM | POA: Diagnosis not present

## 2015-06-21 DIAGNOSIS — H2511 Age-related nuclear cataract, right eye: Secondary | ICD-10-CM | POA: Diagnosis not present

## 2015-06-21 HISTORY — PX: CATARACT EXTRACTION W/PHACO: SHX586

## 2015-06-21 SURGERY — PHACOEMULSIFICATION, CATARACT, WITH IOL INSERTION
Anesthesia: Monitor Anesthesia Care | Site: Eye | Laterality: Right

## 2015-06-21 MED ORDER — EPINEPHRINE HCL 1 MG/ML IJ SOLN
INTRAOCULAR | Status: DC | PRN
  Administered 2015-06-21: 500 mL

## 2015-06-21 MED ORDER — CYCLOPENTOLATE-PHENYLEPHRINE 0.2-1 % OP SOLN
1.0000 [drp] | OPHTHALMIC | Status: AC
  Administered 2015-06-21 (×3): 1 [drp] via OPHTHALMIC

## 2015-06-21 MED ORDER — MIDAZOLAM HCL 2 MG/2ML IJ SOLN
1.0000 mg | INTRAMUSCULAR | Status: DC | PRN
  Administered 2015-06-21: 2 mg via INTRAVENOUS

## 2015-06-21 MED ORDER — FENTANYL CITRATE (PF) 100 MCG/2ML IJ SOLN
25.0000 ug | INTRAMUSCULAR | Status: AC
  Administered 2015-06-21 (×2): 25 ug via INTRAVENOUS

## 2015-06-21 MED ORDER — LIDOCAINE HCL (PF) 1 % IJ SOLN
INTRAMUSCULAR | Status: DC | PRN
  Administered 2015-06-21: .5 mL

## 2015-06-21 MED ORDER — TETRACAINE HCL 0.5 % OP SOLN
1.0000 [drp] | OPHTHALMIC | Status: AC
  Administered 2015-06-21 (×3): 1 [drp] via OPHTHALMIC

## 2015-06-21 MED ORDER — LIDOCAINE 3.5 % OP GEL OPTIME - NO CHARGE
OPHTHALMIC | Status: DC | PRN
  Administered 2015-06-21: 1 [drp] via OPHTHALMIC

## 2015-06-21 MED ORDER — EPINEPHRINE HCL 1 MG/ML IJ SOLN
INTRAMUSCULAR | Status: AC
  Filled 2015-06-21: qty 1

## 2015-06-21 MED ORDER — PHENYLEPHRINE HCL 2.5 % OP SOLN
1.0000 [drp] | OPHTHALMIC | Status: AC
  Administered 2015-06-21 (×3): 1 [drp] via OPHTHALMIC

## 2015-06-21 MED ORDER — NEOMYCIN-POLYMYXIN-DEXAMETH 3.5-10000-0.1 OP SUSP
OPHTHALMIC | Status: DC | PRN
  Administered 2015-06-21: 1 [drp] via OPHTHALMIC

## 2015-06-21 MED ORDER — LACTATED RINGERS IV SOLN
INTRAVENOUS | Status: DC
  Administered 2015-06-21: 10:00:00 via INTRAVENOUS

## 2015-06-21 MED ORDER — LIDOCAINE HCL 3.5 % OP GEL
1.0000 "application " | Freq: Once | OPHTHALMIC | Status: AC
  Administered 2015-06-21: 1 via OPHTHALMIC

## 2015-06-21 MED ORDER — FENTANYL CITRATE (PF) 100 MCG/2ML IJ SOLN
INTRAMUSCULAR | Status: AC
  Filled 2015-06-21: qty 2

## 2015-06-21 MED ORDER — POVIDONE-IODINE 5 % OP SOLN
OPHTHALMIC | Status: DC | PRN
  Administered 2015-06-21: 1 via OPHTHALMIC

## 2015-06-21 MED ORDER — BSS IO SOLN
INTRAOCULAR | Status: DC | PRN
  Administered 2015-06-21: 15 mL

## 2015-06-21 MED ORDER — MIDAZOLAM HCL 2 MG/2ML IJ SOLN
INTRAMUSCULAR | Status: AC
  Filled 2015-06-21: qty 2

## 2015-06-21 MED ORDER — PROVISC 10 MG/ML IO SOLN
INTRAOCULAR | Status: DC | PRN
  Administered 2015-06-21: 0.85 mL via INTRAOCULAR

## 2015-06-21 SURGICAL SUPPLY — 11 items
CLOTH BEACON ORANGE TIMEOUT ST (SAFETY) ×3 IMPLANT
EYE SHIELD UNIVERSAL CLEAR (GAUZE/BANDAGES/DRESSINGS) ×3 IMPLANT
GLOVE BIOGEL PI IND STRL 6.5 (GLOVE) ×1 IMPLANT
GLOVE BIOGEL PI INDICATOR 6.5 (GLOVE) ×2
GLOVE ECLIPSE 8.0 STRL XLNG CF (GLOVE) ×3 IMPLANT
PAD ARMBOARD 7.5X6 YLW CONV (MISCELLANEOUS) ×3 IMPLANT
SIGHTPATH CAT PROC W REG LENS (Ophthalmic Related) ×3 IMPLANT
SYRINGE LUER LOK 1CC (MISCELLANEOUS) ×3 IMPLANT
TAPE SURG TRANSPORE 1 IN (GAUZE/BANDAGES/DRESSINGS) ×1 IMPLANT
TAPE SURGICAL TRANSPORE 1 IN (GAUZE/BANDAGES/DRESSINGS) ×2
WATER STERILE IRR 250ML POUR (IV SOLUTION) ×3 IMPLANT

## 2015-06-21 NOTE — Op Note (Signed)
Date of Admission: 06/21/2015  Date of Surgery: 06/21/2015   Pre-Op Dx: Cataract Right Eye  Post-Op Dx: Senile Nuclear Cataract Right  Eye,  Dx Code H25.11  Surgeon: Tonny Branch, M.D.  Assistants: None  Anesthesia: Topical with MAC  Indications: Painless, progressive loss of vision with compromise of daily activities.  Surgery: Cataract Extraction with Intraocular lens Implant Right Eye  Discription: The patient had dilating drops and viscous lidocaine placed into the Right eye in the pre-op holding area. After transfer to the operating room, a time out was performed. The patient was then prepped and draped. Beginning with a 38 degree blade a paracentesis port was made at the surgeon's 2 o'clock position. The anterior chamber was then filled with 1% non-preserved lidocaine. This was followed by filling the anterior chamber with Provisc.  A 2.4mm keratome blade was used to make a clear corneal incision at the temporal limbus.  A bent cystatome needle was used to create a continuous tear capsulotomy. Hydrodissection was performed with balanced salt solution on a Fine canula. The lens nucleus was then removed using the phacoemulsification handpiece. Residual cortex was removed with the I&A handpiece. The anterior chamber and capsular bag were refilled with Provisc. A posterior chamber intraocular lens was placed into the capsular bag with it's injector. The implant was positioned with the Kuglan hook. The Provisc was then removed from the anterior chamber and capsular bag with the I&A handpiece. Stromal hydration of the main incision and paracentesis port was performed with BSS on a Fine canula. The wounds were tested for leak which was negative. The patient tolerated the procedure well. There were no operative complications. The patient was then transferred to the recovery room in stable condition.  Complications: None  Specimen: None  EBL: None  Prosthetic device: Hoya iSert 250, power 19.5 D,  SN [id number].

## 2015-06-21 NOTE — Anesthesia Preprocedure Evaluation (Signed)
Anesthesia Evaluation  Patient identified by MRN, date of birth, ID band Patient awake    Reviewed: Allergy & Precautions, NPO status , Patient's Chart, lab work & pertinent test results, reviewed documented beta blocker date and time   Airway Mallampati: I  TM Distance: >3 FB     Dental  (+) Edentulous Upper, Edentulous Lower   Pulmonary shortness of breath and with exertion, former smoker,    breath sounds clear to auscultation       Cardiovascular hypertension, Pt. on medications and Pt. on home beta blockers + angina with exertion + CAD, + CABG, + Peripheral Vascular Disease and + DOE  + Valvular Problems/Murmurs  Rhythm:Regular Rate:Bradycardia     Neuro/Psych CVA    GI/Hepatic negative GI ROS,   Endo/Other    Renal/GU      Musculoskeletal   Abdominal   Peds  Hematology   Anesthesia Other Findings   Reproductive/Obstetrics                             Anesthesia Physical Anesthesia Plan  ASA: IV  Anesthesia Plan: MAC   Post-op Pain Management:    Induction: Intravenous  Airway Management Planned: Nasal Cannula  Additional Equipment:   Intra-op Plan:   Post-operative Plan:   Informed Consent: I have reviewed the patients History and Physical, chart, labs and discussed the procedure including the risks, benefits and alternatives for the proposed anesthesia with the patient or authorized representative who has indicated his/her understanding and acceptance.     Plan Discussed with:   Anesthesia Plan Comments:         Anesthesia Quick Evaluation

## 2015-06-21 NOTE — Anesthesia Postprocedure Evaluation (Signed)
  Anesthesia Post-op Note  Patient: Ryan [name]  Procedure(s) Performed: Procedure(s) with comments: CATARACT EXTRACTION PHACO AND INTRAOCULAR LENS PLACEMENT (IOC) (Right) - CDE: 7.05  Patient Location: Short Stay  Anesthesia Type:MAC  Level of Consciousness: awake, alert  and oriented  Airway and Oxygen Therapy: Patient Spontanous Breathing  Post-op Pain: none  Post-op Assessment: Post-op Vital signs reviewed, Patient's Cardiovascular Status Stable, Respiratory Function Stable, Patent Airway and No signs of Nausea or vomiting              Post-op Vital Signs: Reviewed and stable  Last Vitals:  Filed Vitals:   06/21/15 1030  BP: 119/67  Pulse:   Temp:   Resp: 17    Complications: No apparent anesthesia complications

## 2015-06-21 NOTE — Discharge Instructions (Signed)

## 2015-06-21 NOTE — Transfer of Care (Signed)
Immediate Anesthesia Transfer of Care Note  Patient: Ryan [name]  Procedure(s) Performed: Procedure(s) with comments: CATARACT EXTRACTION PHACO AND INTRAOCULAR LENS PLACEMENT (IOC) (Right) - CDE: 7.05  Patient Location: Short Stay  Anesthesia Type:MAC  Level of Consciousness: awake  Airway & Oxygen Therapy: Patient Spontanous Breathing  Post-op Assessment: Report given to RN  Post vital signs: Reviewed  Last Vitals:  Filed Vitals:   06/21/15 1030  BP: 119/67  Pulse:   Temp:   Resp: 17    Complications: No apparent anesthesia complications

## 2015-06-21 NOTE — H&P (Signed)
I have reviewed the H&P, the patient was re-examined, and I have identified no interval changes in medical condition and plan of care since the history and physical of record  

## 2015-06-22 ENCOUNTER — Encounter (HOSPITAL_COMMUNITY): Payer: Self-pay | Admitting: Ophthalmology

## 2015-06-23 ENCOUNTER — Encounter (HOSPITAL_COMMUNITY): Payer: Medicare Other | Attending: Internal Medicine

## 2015-06-23 DIAGNOSIS — C8190 Hodgkin lymphoma, unspecified, unspecified site: Secondary | ICD-10-CM | POA: Insufficient documentation

## 2015-06-23 DIAGNOSIS — C50921 Malignant neoplasm of unspecified site of right male breast: Secondary | ICD-10-CM

## 2015-06-23 DIAGNOSIS — Z8041 Family history of malignant neoplasm of ovary: Secondary | ICD-10-CM | POA: Insufficient documentation

## 2015-06-23 DIAGNOSIS — Z803 Family history of malignant neoplasm of breast: Secondary | ICD-10-CM | POA: Diagnosis not present

## 2015-06-23 DIAGNOSIS — Z951 Presence of aortocoronary bypass graft: Secondary | ICD-10-CM | POA: Diagnosis not present

## 2015-06-23 DIAGNOSIS — C50929 Malignant neoplasm of unspecified site of unspecified male breast: Secondary | ICD-10-CM | POA: Diagnosis not present

## 2015-06-23 DIAGNOSIS — Z7901 Long term (current) use of anticoagulants: Secondary | ICD-10-CM | POA: Diagnosis not present

## 2015-06-23 LAB — PROTIME-INR
INR: 2.88 — ABNORMAL HIGH (ref 0.00–1.49)
Prothrombin Time: 29.7 seconds — ABNORMAL HIGH (ref 11.6–15.2)

## 2015-06-24 ENCOUNTER — Encounter: Payer: Self-pay | Admitting: Genetic Counselor

## 2015-06-24 ENCOUNTER — Other Ambulatory Visit (HOSPITAL_COMMUNITY): Payer: Medicare Other

## 2015-06-24 NOTE — Progress Notes (Signed)
LABS DRAWN

## 2015-06-28 ENCOUNTER — Encounter (HOSPITAL_COMMUNITY)
Admission: RE | Admit: 2015-06-28 | Discharge: 2015-06-28 | Disposition: A | Discharge status: Home / Self Care | Payer: Medicare Other | Source: Ambulatory Visit | Attending: Ophthalmology | Admitting: Ophthalmology

## 2015-06-28 DIAGNOSIS — H2512 Age-related nuclear cataract, left eye: Secondary | ICD-10-CM | POA: Diagnosis not present

## 2015-06-28 DIAGNOSIS — Z961 Presence of intraocular lens: Secondary | ICD-10-CM | POA: Diagnosis not present

## 2015-06-28 NOTE — Pre-Procedure Instructions (Signed)
Attempted pre op call.  No message left, as no identifier on VM

## 2015-06-29 ENCOUNTER — Encounter (HOSPITAL_COMMUNITY): Payer: Self-pay

## 2015-06-30 MED ORDER — PHENYLEPHRINE HCL 2.5 % OP SOLN
OPHTHALMIC | Status: AC
  Filled 2015-06-30: qty 15

## 2015-06-30 MED ORDER — TETRACAINE HCL 0.5 % OP SOLN
OPHTHALMIC | Status: AC
  Filled 2015-06-30: qty 2

## 2015-06-30 MED ORDER — LIDOCAINE HCL (PF) 1 % IJ SOLN
INTRAMUSCULAR | Status: AC
  Filled 2015-06-30: qty 2

## 2015-06-30 MED ORDER — LIDOCAINE HCL 3.5 % OP GEL
OPHTHALMIC | Status: AC
  Filled 2015-06-30: qty 1

## 2015-06-30 MED ORDER — NEOMYCIN-POLYMYXIN-DEXAMETH 3.5-10000-0.1 OP SUSP
OPHTHALMIC | Status: AC
  Filled 2015-06-30: qty 5

## 2015-06-30 MED ORDER — CYCLOPENTOLATE-PHENYLEPHRINE OP SOLN OPTIME - NO CHARGE
OPHTHALMIC | Status: AC
  Filled 2015-06-30: qty 2

## 2015-07-01 ENCOUNTER — Ambulatory Visit (HOSPITAL_COMMUNITY): Payer: Medicare Other | Admitting: Anesthesiology

## 2015-07-01 ENCOUNTER — Ambulatory Visit (HOSPITAL_COMMUNITY)
Admission: RE | Admit: 2015-07-01 | Discharge: 2015-07-01 | Disposition: A | Discharge status: Home / Self Care | Payer: Medicare Other | Source: Ambulatory Visit | Attending: Ophthalmology | Admitting: Ophthalmology

## 2015-07-01 ENCOUNTER — Encounter (HOSPITAL_COMMUNITY): Payer: Self-pay | Admitting: *Deleted

## 2015-07-01 ENCOUNTER — Encounter (HOSPITAL_COMMUNITY)
Admission: RE | Disposition: A | Discharge status: Home / Self Care | Payer: Self-pay | Source: Ambulatory Visit | Attending: Ophthalmology

## 2015-07-01 DIAGNOSIS — Z7901 Long term (current) use of anticoagulants: Secondary | ICD-10-CM | POA: Diagnosis not present

## 2015-07-01 DIAGNOSIS — I25110 Atherosclerotic heart disease of native coronary artery with unstable angina pectoris: Secondary | ICD-10-CM | POA: Insufficient documentation

## 2015-07-01 DIAGNOSIS — I10 Essential (primary) hypertension: Secondary | ICD-10-CM | POA: Diagnosis not present

## 2015-07-01 DIAGNOSIS — E780 Pure hypercholesterolemia: Secondary | ICD-10-CM | POA: Diagnosis not present

## 2015-07-01 DIAGNOSIS — Z87891 Personal history of nicotine dependence: Secondary | ICD-10-CM | POA: Insufficient documentation

## 2015-07-01 DIAGNOSIS — Z7982 Long term (current) use of aspirin: Secondary | ICD-10-CM | POA: Insufficient documentation

## 2015-07-01 DIAGNOSIS — Z79899 Other long term (current) drug therapy: Secondary | ICD-10-CM | POA: Insufficient documentation

## 2015-07-01 DIAGNOSIS — H2512 Age-related nuclear cataract, left eye: Secondary | ICD-10-CM | POA: Insufficient documentation

## 2015-07-01 DIAGNOSIS — Z951 Presence of aortocoronary bypass graft: Secondary | ICD-10-CM | POA: Insufficient documentation

## 2015-07-01 DIAGNOSIS — H2511 Age-related nuclear cataract, right eye: Secondary | ICD-10-CM | POA: Diagnosis not present

## 2015-07-01 DIAGNOSIS — H269 Unspecified cataract: Secondary | ICD-10-CM | POA: Diagnosis not present

## 2015-07-01 HISTORY — PX: CATARACT EXTRACTION W/PHACO: SHX586

## 2015-07-01 SURGERY — PHACOEMULSIFICATION, CATARACT, WITH IOL INSERTION
Anesthesia: Monitor Anesthesia Care | Site: Eye | Laterality: Left

## 2015-07-01 MED ORDER — PROVISC 10 MG/ML IO SOLN
INTRAOCULAR | Status: DC | PRN
  Administered 2015-07-01: 0.85 mL via INTRAOCULAR

## 2015-07-01 MED ORDER — CYCLOPENTOLATE-PHENYLEPHRINE 0.2-1 % OP SOLN
1.0000 [drp] | OPHTHALMIC | Status: AC
  Administered 2015-07-01 (×3): 1 [drp] via OPHTHALMIC

## 2015-07-01 MED ORDER — BSS IO SOLN
INTRAOCULAR | Status: DC | PRN
  Administered 2015-07-01: 15 mL

## 2015-07-01 MED ORDER — EPINEPHRINE HCL 1 MG/ML IJ SOLN
INTRAMUSCULAR | Status: AC
  Filled 2015-07-01: qty 1

## 2015-07-01 MED ORDER — PHENYLEPHRINE HCL 2.5 % OP SOLN
1.0000 [drp] | OPHTHALMIC | Status: AC
  Administered 2015-07-01 (×3): 1 [drp] via OPHTHALMIC

## 2015-07-01 MED ORDER — LIDOCAINE HCL (PF) 1 % IJ SOLN
INTRAMUSCULAR | Status: DC | PRN
  Administered 2015-07-01: .6 mL

## 2015-07-01 MED ORDER — LIDOCAINE HCL 3.5 % OP GEL
1.0000 "application " | Freq: Once | OPHTHALMIC | Status: AC
  Administered 2015-07-01: 1 via OPHTHALMIC

## 2015-07-01 MED ORDER — LIDOCAINE 3.5 % OP GEL OPTIME - NO CHARGE
OPHTHALMIC | Status: DC | PRN
  Administered 2015-07-01: 1 [drp] via OPHTHALMIC

## 2015-07-01 MED ORDER — MIDAZOLAM HCL 2 MG/2ML IJ SOLN
1.0000 mg | INTRAMUSCULAR | Status: DC | PRN
  Administered 2015-07-01: 2 mg via INTRAVENOUS
  Filled 2015-07-01: qty 2

## 2015-07-01 MED ORDER — FENTANYL CITRATE (PF) 100 MCG/2ML IJ SOLN
25.0000 ug | INTRAMUSCULAR | Status: AC
  Administered 2015-07-01 (×2): 25 ug via INTRAVENOUS
  Filled 2015-07-01: qty 2

## 2015-07-01 MED ORDER — TETRACAINE HCL 0.5 % OP SOLN
1.0000 [drp] | OPHTHALMIC | Status: AC
  Administered 2015-07-01 (×3): 1 [drp] via OPHTHALMIC

## 2015-07-01 MED ORDER — NEOMYCIN-POLYMYXIN-DEXAMETH 3.5-10000-0.1 OP SUSP
OPHTHALMIC | Status: DC | PRN
  Administered 2015-07-01: 1 [drp] via OPHTHALMIC

## 2015-07-01 MED ORDER — LACTATED RINGERS IV SOLN
INTRAVENOUS | Status: DC
  Administered 2015-07-01: 10:00:00 via INTRAVENOUS

## 2015-07-01 MED ORDER — EPINEPHRINE HCL 1 MG/ML IJ SOLN
INTRAOCULAR | Status: DC | PRN
  Administered 2015-07-01: 500 mL

## 2015-07-01 MED ORDER — POVIDONE-IODINE 5 % OP SOLN
OPHTHALMIC | Status: DC | PRN
  Administered 2015-07-01: 1 via OPHTHALMIC

## 2015-07-01 SURGICAL SUPPLY — 11 items
CLOTH BEACON ORANGE TIMEOUT ST (SAFETY) ×3 IMPLANT
EYE SHIELD UNIVERSAL CLEAR (GAUZE/BANDAGES/DRESSINGS) ×3 IMPLANT
GLOVE BIOGEL PI IND STRL 6.5 (GLOVE) ×1 IMPLANT
GLOVE BIOGEL PI INDICATOR 6.5 (GLOVE) ×2
GLOVE ECLIPSE 8.0 STRL XLNG CF (GLOVE) ×3 IMPLANT
PAD ARMBOARD 7.5X6 YLW CONV (MISCELLANEOUS) ×3 IMPLANT
SIGHTPATH CAT PROC W REG LENS (Ophthalmic Related) ×3 IMPLANT
SYRINGE LUER LOK 1CC (MISCELLANEOUS) ×3 IMPLANT
TAPE SURG TRANSPORE 1 IN (GAUZE/BANDAGES/DRESSINGS) ×1 IMPLANT
TAPE SURGICAL TRANSPORE 1 IN (GAUZE/BANDAGES/DRESSINGS) ×2
WATER STERILE IRR 250ML POUR (IV SOLUTION) ×3 IMPLANT

## 2015-07-01 NOTE — Transfer of Care (Signed)
Immediate Anesthesia Transfer of Care Note  Patient: Ryan [name]  Procedure(s) Performed: Procedure(s) with comments: CATARACT EXTRACTION PHACO AND INTRAOCULAR LENS PLACEMENT (IOC) (Left) - CDE: 7.49  Patient Location: Short Stay  Anesthesia Type:MAC  Level of Consciousness: awake, alert , oriented and patient cooperative  Airway & Oxygen Therapy: Patient Spontanous Breathing  Post-op Assessment: Report given to RN, Post -op Vital signs reviewed and stable and Patient moving all extremities  Post vital signs: Reviewed and stable  Last Vitals:  Filed Vitals:   07/01/15 1010  BP: 127/64  Pulse:   Temp:   Resp: 12    Complications: No apparent anesthesia complications

## 2015-07-01 NOTE — Discharge Instructions (Signed)

## 2015-07-01 NOTE — H&P (Signed)
I have reviewed the H&P, the patient was re-examined, and I have identified no interval changes in medical condition and plan of care since the history and physical of record  

## 2015-07-01 NOTE — Anesthesia Preprocedure Evaluation (Signed)
Anesthesia Evaluation  Patient identified by MRN, date of birth, ID band Patient awake    Reviewed: Allergy & Precautions, NPO status , Patient's Chart, lab work & pertinent test results, reviewed documented beta blocker date and time   Airway Mallampati: I  TM Distance: >3 FB     Dental  (+) Edentulous Upper, Edentulous Lower   Pulmonary shortness of breath and with exertion, former smoker,    breath sounds clear to auscultation       Cardiovascular hypertension, Pt. on medications and Pt. on home beta blockers + angina with exertion + CAD, + CABG, + Peripheral Vascular Disease and + DOE  + Valvular Problems/Murmurs  Rhythm:Regular Rate:Bradycardia     Neuro/Psych CVA    GI/Hepatic negative GI ROS,   Endo/Other    Renal/GU      Musculoskeletal   Abdominal   Peds  Hematology   Anesthesia Other Findings   Reproductive/Obstetrics                             Anesthesia Physical Anesthesia Plan  ASA: IV  Anesthesia Plan: MAC   Post-op Pain Management:    Induction: Intravenous  Airway Management Planned: Nasal Cannula  Additional Equipment:   Intra-op Plan:   Post-operative Plan:   Informed Consent: I have reviewed the patients History and Physical, chart, labs and discussed the procedure including the risks, benefits and alternatives for the proposed anesthesia with the patient or authorized representative who has indicated his/her understanding and acceptance.     Plan Discussed with:   Anesthesia Plan Comments:         Anesthesia Quick Evaluation                                  Anesthesia Evaluation  Patient identified by MRN, date of birth, ID band Patient awake    Reviewed: Allergy & Precautions, NPO status , Patient's Chart, lab work & pertinent test results, reviewed documented beta blocker date and time   Airway Mallampati: I  TM Distance: >3 FB     Dental  (+) Edentulous Upper, Edentulous Lower   Pulmonary shortness of breath and with exertion, former smoker,    breath sounds clear to auscultation       Cardiovascular hypertension, Pt. on medications and Pt. on home beta blockers + angina with exertion + CAD, + CABG, + Peripheral Vascular Disease and + DOE  + Valvular Problems/Murmurs  Rhythm:Regular Rate:Bradycardia     Neuro/Psych CVA    GI/Hepatic negative GI ROS,   Endo/Other    Renal/GU      Musculoskeletal   Abdominal   Peds  Hematology   Anesthesia Other Findings   Reproductive/Obstetrics                             Anesthesia Physical Anesthesia Plan  ASA: IV  Anesthesia Plan: MAC   Post-op Pain Management:    Induction: Intravenous  Airway Management Planned: Nasal Cannula  Additional Equipment:   Intra-op Plan:   Post-operative Plan:   Informed Consent: I have reviewed the patients History and Physical, chart, labs and discussed the procedure including the risks, benefits and alternatives for the proposed anesthesia with the patient or authorized representative who has indicated his/her understanding and acceptance.     Plan Discussed with:   Anesthesia  Plan Comments:         Anesthesia Quick Evaluation

## 2015-07-01 NOTE — Op Note (Signed)
Date of Admission: 07/01/2015  Date of Surgery: 07/01/2015   Pre-Op Dx: Cataract Left Eye  Post-Op Dx: Senile Nuclear Cataract Left  Eye,  Dx Code H25.12  Surgeon: Tonny Branch, M.D.  Assistants: None  Anesthesia: Topical with MAC  Indications: Painless, progressive loss of vision with compromise of daily activities.  Surgery: Cataract Extraction with Intraocular lens Implant Left Eye  Discription: The patient had dilating drops and viscous lidocaine placed into the Left eye in the pre-op holding area. After transfer to the operating room, a time out was performed. The patient was then prepped and draped. Beginning with a 49 degree blade a paracentesis port was made at the surgeon's 2 o'clock position. The anterior chamber was then filled with 1% non-preserved lidocaine. This was followed by filling the anterior chamber with Provisc.  A 2.70mm keratome blade was used to make a clear corneal incision at the temporal limbus.  A bent cystatome needle was used to create a continuous tear capsulotomy. Hydrodissection was performed with balanced salt solution on a Fine canula. The lens nucleus was then removed using the phacoemulsification handpiece. Residual cortex was removed with the I&A handpiece. The anterior chamber and capsular bag were refilled with Provisc. A posterior chamber intraocular lens was placed into the capsular bag with it's injector. The implant was positioned with the Kuglan hook. The Provisc was then removed from the anterior chamber and capsular bag with the I&A handpiece. Stromal hydration of the main incision and paracentesis port was performed with BSS on a Fine canula. The wounds were tested for leak which was negative. The patient tolerated the procedure well. There were no operative complications. The patient was then transferred to the recovery room in stable condition.  Complications: None  Specimen: None  EBL: None  Prosthetic device: Hoya iSert 250, power 19.0 D, SN  [id number].

## 2015-07-01 NOTE — Anesthesia Postprocedure Evaluation (Signed)
  Anesthesia Post-op Note  Patient: Ryan [name]  Procedure(s) Performed: Procedure(s) with comments: CATARACT EXTRACTION PHACO AND INTRAOCULAR LENS PLACEMENT (IOC) (Left) - CDE: 7.49  Patient Location: Short Stay  Anesthesia Type:MAC  Level of Consciousness: awake, alert , oriented and patient cooperative  Airway and Oxygen Therapy: Patient Spontanous Breathing  Post-op Pain: none  Post-op Assessment: Post-op Vital signs reviewed, Patient's Cardiovascular Status Stable, Respiratory Function Stable, Patent Airway, No signs of Nausea or vomiting and Pain level controlled              Post-op Vital Signs: Reviewed and stable  Last Vitals:  Filed Vitals:   07/01/15 1010  BP: 127/64  Pulse:   Temp:   Resp: 12    Complications: No apparent anesthesia complications

## 2015-07-02 ENCOUNTER — Encounter (HOSPITAL_COMMUNITY): Payer: Self-pay | Admitting: Ophthalmology

## 2015-07-05 ENCOUNTER — Other Ambulatory Visit (HOSPITAL_COMMUNITY): Payer: Self-pay

## 2015-07-05 DIAGNOSIS — C50919 Malignant neoplasm of unspecified site of unspecified female breast: Secondary | ICD-10-CM

## 2015-07-05 DIAGNOSIS — I6522 Occlusion and stenosis of left carotid artery: Secondary | ICD-10-CM

## 2015-07-07 ENCOUNTER — Telehealth (HOSPITAL_COMMUNITY): Payer: Self-pay | Admitting: Emergency Medicine

## 2015-07-07 ENCOUNTER — Encounter (HOSPITAL_BASED_OUTPATIENT_CLINIC_OR_DEPARTMENT_OTHER): Payer: Medicare Other

## 2015-07-07 DIAGNOSIS — C50919 Malignant neoplasm of unspecified site of unspecified female breast: Secondary | ICD-10-CM

## 2015-07-07 DIAGNOSIS — C50929 Malignant neoplasm of unspecified site of unspecified male breast: Secondary | ICD-10-CM | POA: Diagnosis not present

## 2015-07-07 DIAGNOSIS — I6522 Occlusion and stenosis of left carotid artery: Secondary | ICD-10-CM

## 2015-07-07 DIAGNOSIS — Z8041 Family history of malignant neoplasm of ovary: Secondary | ICD-10-CM | POA: Diagnosis not present

## 2015-07-07 DIAGNOSIS — Z951 Presence of aortocoronary bypass graft: Secondary | ICD-10-CM | POA: Diagnosis not present

## 2015-07-07 DIAGNOSIS — C8190 Hodgkin lymphoma, unspecified, unspecified site: Secondary | ICD-10-CM | POA: Diagnosis not present

## 2015-07-07 DIAGNOSIS — Z7901 Long term (current) use of anticoagulants: Secondary | ICD-10-CM | POA: Diagnosis not present

## 2015-07-07 DIAGNOSIS — Z803 Family history of malignant neoplasm of breast: Secondary | ICD-10-CM | POA: Diagnosis not present

## 2015-07-07 LAB — PROTIME-INR
INR: 2.5 — ABNORMAL HIGH (ref 0.00–1.49)
Prothrombin Time: 26.7 seconds — ABNORMAL HIGH (ref 11.6–15.2)

## 2015-07-07 NOTE — Telephone Encounter (Signed)
-----   Message from Baird Cancer, PA-C sent at 07/07/2015 11:38 AM EDT ----- Excellent.  Same dose.  INR in 4 weeks

## 2015-07-07 NOTE — Telephone Encounter (Signed)
Notified pts PT/INR was excellent and repeat in 4 weeks

## 2015-07-07 NOTE — Progress Notes (Signed)
Labs drawn

## 2015-07-19 ENCOUNTER — Other Ambulatory Visit (HOSPITAL_COMMUNITY): Payer: Self-pay | Admitting: Internal Medicine

## 2015-07-19 DIAGNOSIS — Z09 Encounter for follow-up examination after completed treatment for conditions other than malignant neoplasm: Secondary | ICD-10-CM

## 2015-07-30 ENCOUNTER — Ambulatory Visit (HOSPITAL_COMMUNITY): Payer: Medicare Other | Admitting: Hematology & Oncology

## 2015-07-30 ENCOUNTER — Encounter (HOSPITAL_BASED_OUTPATIENT_CLINIC_OR_DEPARTMENT_OTHER): Payer: Medicare Other | Admitting: Oncology

## 2015-07-30 ENCOUNTER — Encounter (HOSPITAL_COMMUNITY): Payer: Self-pay | Admitting: Oncology

## 2015-07-30 ENCOUNTER — Encounter (HOSPITAL_COMMUNITY): Payer: Medicare Other | Attending: Internal Medicine

## 2015-07-30 VITALS — BP 141/52 | HR 78 | Temp 98.1°F | Resp 18 | Wt 252.0 lb

## 2015-07-30 DIAGNOSIS — Z1509 Genetic susceptibility to other malignant neoplasm: Secondary | ICD-10-CM

## 2015-07-30 DIAGNOSIS — Z7901 Long term (current) use of anticoagulants: Secondary | ICD-10-CM

## 2015-07-30 DIAGNOSIS — D649 Anemia, unspecified: Secondary | ICD-10-CM | POA: Diagnosis not present

## 2015-07-30 DIAGNOSIS — Z8041 Family history of malignant neoplasm of ovary: Secondary | ICD-10-CM | POA: Insufficient documentation

## 2015-07-30 DIAGNOSIS — Z951 Presence of aortocoronary bypass graft: Secondary | ICD-10-CM | POA: Insufficient documentation

## 2015-07-30 DIAGNOSIS — C50929 Malignant neoplasm of unspecified site of unspecified male breast: Secondary | ICD-10-CM | POA: Insufficient documentation

## 2015-07-30 DIAGNOSIS — C50921 Malignant neoplasm of unspecified site of right male breast: Secondary | ICD-10-CM

## 2015-07-30 DIAGNOSIS — Z1501 Genetic susceptibility to malignant neoplasm of breast: Secondary | ICD-10-CM

## 2015-07-30 DIAGNOSIS — I6522 Occlusion and stenosis of left carotid artery: Secondary | ICD-10-CM

## 2015-07-30 DIAGNOSIS — C8190 Hodgkin lymphoma, unspecified, unspecified site: Secondary | ICD-10-CM

## 2015-07-30 DIAGNOSIS — Z0000 Encounter for general adult medical examination without abnormal findings: Secondary | ICD-10-CM

## 2015-07-30 DIAGNOSIS — Z23 Encounter for immunization: Secondary | ICD-10-CM | POA: Diagnosis not present

## 2015-07-30 DIAGNOSIS — Z1589 Genetic susceptibility to other disease: Secondary | ICD-10-CM

## 2015-07-30 DIAGNOSIS — Z1502 Genetic susceptibility to malignant neoplasm of ovary: Secondary | ICD-10-CM

## 2015-07-30 DIAGNOSIS — Z803 Family history of malignant neoplasm of breast: Secondary | ICD-10-CM | POA: Diagnosis not present

## 2015-07-30 DIAGNOSIS — C50919 Malignant neoplasm of unspecified site of unspecified female breast: Secondary | ICD-10-CM

## 2015-07-30 LAB — COMPREHENSIVE METABOLIC PANEL
ALK PHOS: 38 U/L (ref 38–126)
ALT: 17 U/L (ref 17–63)
AST: 34 U/L (ref 15–41)
Albumin: 3.6 g/dL (ref 3.5–5.0)
Anion gap: 10 (ref 5–15)
BILIRUBIN TOTAL: 0.8 mg/dL (ref 0.3–1.2)
BUN: 22 mg/dL — ABNORMAL HIGH (ref 6–20)
CALCIUM: 9.1 mg/dL (ref 8.9–10.3)
CO2: 23 mmol/L (ref 22–32)
Chloride: 107 mmol/L (ref 101–111)
Creatinine, Ser: 1.57 mg/dL — ABNORMAL HIGH (ref 0.61–1.24)
GFR calc non Af Amer: 44 mL/min — ABNORMAL LOW (ref >60)
GFR, EST AFRICAN AMERICAN: 51 mL/min — AB (ref >60)
GLUCOSE: 144 mg/dL — AB (ref 65–99)
Potassium: 4 mmol/L (ref 3.5–5.1)
SODIUM: 140 mmol/L (ref 135–145)
TOTAL PROTEIN: 6.4 g/dL — AB (ref 6.5–8.1)

## 2015-07-30 LAB — CBC WITH DIFFERENTIAL/PLATELET
Basophils Absolute: 0.1 10*3/uL (ref 0.0–0.1)
Basophils Relative: 1 %
EOS ABS: 0.3 10*3/uL (ref 0.0–0.7)
Eosinophils Relative: 5 %
HEMATOCRIT: 39.2 % (ref 39.0–52.0)
HEMOGLOBIN: 12.8 g/dL — AB (ref 13.0–17.0)
LYMPHS ABS: 2.2 10*3/uL (ref 0.7–4.0)
LYMPHS PCT: 30 %
MCH: 29.5 pg (ref 26.0–34.0)
MCHC: 32.7 g/dL (ref 30.0–36.0)
MCV: 90.3 fL (ref 78.0–100.0)
MONOS PCT: 9 %
Monocytes Absolute: 0.7 10*3/uL (ref 0.1–1.0)
NEUTROS ABS: 4.1 10*3/uL (ref 1.7–7.7)
NEUTROS PCT: 55 %
Platelets: 213 10*3/uL (ref 150–400)
RBC: 4.34 MIL/uL (ref 4.22–5.81)
RDW: 13.8 % (ref 11.5–15.5)
WBC: 7.4 10*3/uL (ref 4.0–10.5)

## 2015-07-30 LAB — PROTIME-INR
INR: 2.87 — AB (ref 0.00–1.49)
Prothrombin Time: 29.6 seconds — ABNORMAL HIGH (ref 11.6–15.2)

## 2015-07-30 MED ORDER — INFLUENZA VAC SPLIT QUAD 0.5 ML IM SUSY
0.5000 mL | PREFILLED_SYRINGE | Freq: Once | INTRAMUSCULAR | Status: AC
  Administered 2015-07-30: 0.5 mL via INTRAMUSCULAR
  Filled 2015-07-30: qty 0.5

## 2015-07-30 NOTE — Assessment & Plan Note (Signed)
Anemia panel in 1 month

## 2015-07-30 NOTE — Progress Notes (Signed)
Labs drawn

## 2015-07-30 NOTE — Progress Notes (Signed)
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 Anticoagulated on Coumadin as well   INTERVAL HISTORY: Ryan [name] 64 y.o. male returns for followup of Stage II cancer the right breast, grade 1, without LV I. ER +96%, PR 4%, Ki-67 marker low at 19%, HER-2/neu nonamplified. Oncotype score was 14. He was placed on tamoxifen 10 mg twice a day which he will take for 5 full years starting 03/08/2011. He is status post right mastectomy in April 2012 at which time he was found to have a 2.1 cm primary, 3 sentinel nodes were negative. AND Hodgkin's [phone]  04/27/2011  Initial Diagnosis  Hodgkin's [phone]      Invasive ductal carcinoma of right breast, stage 2- 2012 s/p chemo and mastectomy   12/14/2010 Initial Diagnosis Right needle core biopsy at 9 o'clock position positive for invasive ductal carcinoma   01/17/2011 Surgery Right simple mastectomy showing a 2.1 cm invasive ductal carcinoma, clear marhins, 0/2 lymph nodes.  ER 96%, PR 84%, Ki-67 19%, Her2 negative.   03/08/2011 -  Chemotherapy Tamoxifen x 5 years    Hodgkin's disease-1983   04/27/2011 Initial Diagnosis Hodgkin's disease-1983   I personally reviewed and went over laboratory results  with the patient.  The results are noted within this dictation.  INR is good.  He is given direction on his anticoagulation.  He reports that over the patient month, his sweating has increased.  He calls this night sweats.  He notes that last night, he was laying on his side and when he rolled on his back, his sheets were wet where he was laying.  He reports sweats during the day as well.  He thinks that are worse at night.  He takes his Tamoxifen in the evening at dinner time.  He reports that his sweats at night occur around the same time every night with flushing and "my eyes get hot and red."  He denies any unintentional weight loss, he denies any documented fevers or chills, he denies any new lumps or bumps, and he denies any change in appetite.  We spent a lot of time discussing his 5 year anniversary in about 6 months regarding his anti-endocrine therapy.  I have reached out to the [employer] representative regarding BCI testing in male breast cancers.  He notes that it has been done, but usually, Medicare denies as the testing is not validated in men to date.  I discussed this information with the patient.  I reviewed the goals of BCI testing and its utility in decision making regarding the length of anti-endocrine therapy.  He is interested in the testing.  He wants to be off Tamoxifen so his anticoagulation regimen can be altered to avoid INR checks.  He may be a candidate for change to Arimidex, if he wants to pursue therapy past 5 years.  His daughter had an appt with [place] on  10/3 for leukocytosis.  She was not made aware of this referral by her primary care provider who set the referral up, so when she received a voice message regarding the 10/3 appointment, she thought that we had the wrong number and it applied to her father.  She is uninsured currently.  She sees her primary care provider today.  Past Medical History  Diagnosis Date  . Invasive ductal carcinoma of breast, stage 2 ([place])  04/27/2011    chemo, mastectomy  . Right carotid artery occlusion 04/27/2011  . Left carotid artery partial occlusion 04/27/2011    CAROTIC DOPPLER 11/2013: < 40% R ICA stenosis, distal waveforms damped - suggests probably intracranial occlusoin, < 83% LICA, normal vertebrals -- no change  . Stroke [facility])     2007  . Hypercholesterolemia   . Adie's pupil   . H/O Hodgkin's disease 15  . Thyroid disease   . S/P CABG x 3 04/09/2013    LIMA-LAD, fLRAD-RI, SVG-RCA; Echo 10/29/'15:  mild Conc LVH, no WMA, Gr 1 DD, Mod AoV Sclerosis w/ Mod AI, ~Mild-Mod MS   . CAD (coronary artery disease), native coronary artery 04/09/2013    Dist LM stenosis 70-80% (at trifurcation into LAD, CX, & 2 Ramus branches, 70% mid RCA);; b) Myoview 08/06/14: Low Risk, no Ischemia/infarction  . Essential hypertension   . Mild mitral stenosis by prior echocardiogram 08/06/2014     Noted on echocardiogram  . Lymphoma ([place]) 1983    Aged 73  . Shortness of breath dyspnea   . Anemia 07/14/2013    has Invasive ductal carcinoma of right breast, stage 2- 2012 s/p chemo and mastectomy; Hodgkin's [id number]; Known RICA occlusion (CVA '07) partial LICA stenosis (34-19%) Jan 2014; Angina, class III - cath 04/07/13- 3V CAD; Hyperlipidemia with target LDL less than 70; Heart palpitations; Claudication of calf muscles (HCC); S/P CABG x 3  LIMA-LAD, free LRA to RI, SVG-RCA  - 04/09/13; Chronic anticoagulation- Coumadin; Obesity (BMI 30-39.9); CAD in native artery - status post CABG x3 (LIMA-LAD, Left Radial-Ramus Intermedius, SVG-RCA; Dizzy spells; Orthostatic hypotension; Anemia; Coronary atherosclerosis of native coronary artery - multivessel CAD, status post CABG x3 (LIMA-LAD, Left Radial-RI, SVG-RCA); Fatigue; Occlusion and stenosis of carotid artery without mention of cerebral infarction; Known RICA occlusion (CVA '07), partial LICA stenosis (37-90%); Exertional dyspnea; Essential hypertension; Cancer ([place]); Malignant neoplasm of breast  associated with mutation in CHEK2 gene [facility]); and Genetic testing on his problem list.     has No Known Allergies.  Current Outpatient Prescriptions on File Prior to Visit  Medication Sig Dispense Refill  . acetaminophen (TYLENOL) 500 MG tablet Take 500 mg by mouth every 6 (six) hours as needed for mild pain.     . Albuterol Sulfate (PROAIR RESPICLICK) 240 (90 BASE) MCG/ACT AEPB Inhale 1 puff into the lungs 4 (four) times daily as needed (shortness of breath.).    Marland Kitchen ALPRAZolam (XANAX) 1 MG tablet Take 0.5 mg by mouth at bedtime as needed for anxiety.     Marland Kitchen aspirin EC 81 MG tablet Take 81 mg by mouth daily.     . Calcium Carbonate-Vit D-Min (CALCIUM 1200 PO) Take 1 tablet by mouth daily.     . Cholecalciferol (VITAMIN D-3 PO) Take 1 tablet by mouth daily.     . citalopram (CELEXA) 20 MG tablet Take 20 mg by mouth 2 (two) times daily.     . Coconut Oil 1000 MG CAPS Take 1,000 mg by mouth daily.    . diphenhydrAMINE (  BENADRYL) 25 MG tablet Take 25 mg by mouth daily as needed for allergies.    . Garlic 712 MG CAPS Take 500 mg by mouth daily.    Marland Kitchen Hawthorne Berry 550 MG CAPS Take 550 mg by mouth at bedtime.    Marland Kitchen levothyroxine (SYNTHROID, LEVOTHROID) 75 MCG tablet Take 75 mcg by mouth daily.     . metoprolol tartrate (LOPRESSOR) 25 MG tablet Take 0.5 tablets (12.5 mg total) by mouth at bedtime. 45 tablet 3  . nitroGLYCERIN (NITROSTAT) 0.4 MG SL tablet Place 1 tablet (0.4 mg total) under the tongue every 5 (five) minutes as needed for chest pain. 25 tablet 3  . Omega-3 Fatty Acids (FISH OIL) 1000 MG CPDR Take 1 each by mouth daily.    . simvastatin (ZOCOR) 40 MG tablet Take 40 mg by mouth at bedtime.     . tamoxifen (NOLVADEX) 20 MG tablet Take 1 tablet (20 mg total) by mouth daily. 30 tablet 5  . Umeclidinium-Vilanterol (ANORO ELLIPTA) 62.5-25 MCG/INH AEPB Inhale 1 puff into the lungs daily.    Marland Kitchen warfarin (COUMADIN) 1 MG tablet Take as directed. 60 tablet 3  . warfarin (COUMADIN) 4 MG tablet Take  1 tablet (4 mg total) by mouth daily. 30 tablet 6  . BESIVANCE 0.6 % SUSP Place 1 drop into the right eye 3 (three) times daily.    . DUREZOL 0.05 % EMUL Place 1 drop into the right eye 3 (three) times daily.    . Ginkgo Biloba 60 MG CAPS Take 1 capsule by mouth daily.     No current facility-administered medications on file prior to visit.    Past Surgical History  Procedure Laterality Date  . Mastectomy  rt  . Lymph node biopsy      left neck  . Coronary artery bypass graft [name] 04/08/2013    Procedure: CORONARY ARTERY BYPASS GRAFTING (CABG);  Surgeon: Ivin Poot, MD;  Location: [place];  Service: Open Heart Surgery;  Laterality: [name];  Times 3 using left internal mammary artery; endoscopically harvested right saphenous vein and left radial artery  . Intraoperative transesophageal echocardiogram [name] 04/08/2013    Procedure: INTRAOPERATIVE TRANSESOPHAGEAL ECHOCARDIOGRAM;  Surgeon: Ivin Poot, MD;  Location: [place];  Service: Open Heart Surgery;  Laterality: [name];  . Radial artery harvest Left 04/08/2013    Procedure: RADIAL ARTERY HARVEST;  Surgeon: Ivin Poot, MD;  Location: [place];  Service: Open Heart Surgery;  Laterality: Left;  . Nm treadmill myoview ltd  08/06/2014    Exercise 5:07 min, 6.3 METS; symptoms noted or extreme dyspnea and lightheadedness with some chest tightness. No EKG changes. No ischemia or infarction was normal EF.  . Transthoracic echocardiogram  08/06/2014     Normal EF 55-60%. Gr 1 DD - increased LVEDP. Moderate aortic valve thickening suggestive of AoV Sclerosis but not stenosis. Mild AI; MIld MS - mean gradient of 7 mmHg & HR of 122 bpm.  . Left heart catheterization with coronary angiogram [name] 04/07/2013    Procedure: LEFT HEART CATHETERIZATION WITH CORONARY ANGIOGRAM;  Surgeon: Leonie Man, MD;  Location: [facility] CATH LAB;  Service: Cardiovascular;  Laterality: [name];  . Colonoscopy [name] 05/06/2015    Procedure: COLONOSCOPY;  Surgeon: Rogene Houston, MD;   [facility] ENDO [facility];  Service: Endoscopy;  Laterality: [name];  930  . Cataract extraction w/phaco Right 06/21/2015    Procedure: CATARACT EXTRACTION PHACO AND INTRAOCULAR LENS PLACEMENT (IOC);  Surgeon: Tonny Branch, MD;  Location: [facility]  ORS;  Service: Ophthalmology;  Laterality: Right;  CDE: 7.05  . Cataract extraction w/phaco Left 07/01/2015    Procedure: CATARACT EXTRACTION PHACO AND INTRAOCULAR LENS PLACEMENT (IOC);  Surgeon: Tonny Branch, MD;  Location: [facility] ORS;  Service: Ophthalmology;  Laterality: Left;  CDE: 7.49    Denies any headaches, dizziness, double vision, fevers, chills, nausea, vomiting, diarrhea, constipation, chest pain, heart palpitations, shortness of breath, blood in stool, black tarry stool, urinary pain, urinary burning, urinary frequency, hematuria.   PHYSICAL EXAMINATION  ECOG PERFORMANCE STATUS: 1 - Symptomatic but completely ambulatory  Filed Vitals:   07/30/15 1116  BP: 141/52  Pulse: 78  Temp: 98.1 F (36.7 C)  Resp: 18    GENERAL:alert, no distress, well nourished, well developed, comfortable, cooperative, obese, smiling and accompanied by his wife. SKIN: skin color, texture, turgor are normal, no rashes or significant lesions HEAD: Normocephalic, No masses, lesions, tenderness or abnormalities EYES: normal, PERRLA, EOMI, Conjunctiva are pink and non-injected EARS: External ears normal OROPHARYNX:lips, buccal mucosa, and tongue normal and mucous membranes are moist  NECK: supple, no adenopathy, thyroid normal size, non-tender, without nodularity, trachea midline LYMPH:  no palpable lymphadenopathy BREAST:risk and benefit of breast self-exam was discussed LUNGS: clear to auscultation and percussion HEART: regular rate & rhythm, no murmurs, no gallops, S1 normal and S2 normal ABDOMEN:abdomen soft, non-tender, obese and normal bowel sounds BACK: Back symmetric, no curvature., No CVA tenderness EXTREMITIES:less then 2 second capillary refill, no joint  deformities, effusion, or inflammation, no skin discoloration, no clubbing, no cyanosis  NEURO: alert & oriented x 3 with fluent speech, no focal motor/sensory deficits, gait normal   LABORATORY DATA: CBC    Component Value Date/Time   WBC 7.4 07/30/2015 1052   RBC 4.34 07/30/2015 1052   HGB 12.8* 07/30/2015 1052   HCT 39.2 07/30/2015 1052   PLT 213 07/30/2015 1052   MCV 90.3 07/30/2015 1052   MCH 29.5 07/30/2015 1052   MCHC 32.7 07/30/2015 1052   RDW 13.8 07/30/2015 1052   LYMPHSABS 2.2 07/30/2015 1052   MONOABS 0.7 07/30/2015 1052   EOSABS 0.3 07/30/2015 1052   BASOSABS 0.1 07/30/2015 1052      Chemistry      Component Value Date/Time   NA 140 07/30/2015 1052   K 4.0 07/30/2015 1052   CL 107 07/30/2015 1052   CO2 23 07/30/2015 1052   BUN 22* 07/30/2015 1052   CREATININE 1.57* 07/30/2015 1052   CREATININE 1.45* 07/14/2013 1639      Component Value Date/Time   CALCIUM 9.1 07/30/2015 1052   ALKPHOS 38 07/30/2015 1052   AST 34 07/30/2015 1052   ALT 17 07/30/2015 1052   BILITOT 0.8 07/30/2015 1052        PENDING LABS:   RADIOGRAPHIC STUDIES:  No results found.   PATHOLOGY:    ASSESSMENT AND PLAN:  Invasive ductal carcinoma of right breast, stage 2- 2012 s/p chemo and mastectomy Stage II cancer the right breast, grade 1, without LV I. ER +96%, PR 4%, Ki-67 marker low at 19%, HER-2/neu nonamplified. Oncotype score was 14. He was placed on tamoxifen 20 mg a day which he will take for 5 full years starting 03/08/2011. He is status post right mastectomy in April 2012 at which time he was found to have a 2.1 cm primary, 3 sentinel nodes were negative.  Oncology history is up-to-date.  Coming up on his 5 year anniversary of anti-endocrine therapy on May 2017.  Long discussion regarding BCI testing.  He  is interested in the test to help him with his decision making regarding the length of anti-endocrine therapy. We may have issues with insurance company due to this  test not being validated in male breast cancers.  However, he has a CHEK2 mutation.  He would benefit from this test.  Additionally, he wants to be off Tamoxifen to help with his hot flashes and to transition to a different anticoagulant to prevent INR checks and allow him to enjoy broadening of his diet.  We will send this test off and see what comes of the coverage.  Representative for the company is notified.  Return in 6 months for follow-up, sooner if needed.  Hodgkin's [phone] NED  Patient reported increase night sweats x 1 month.  True night sweat versus hot flash from Tamoxifen?  Timing of night sweats and administration of Tamoxifen are suspect for correlation. No B symptoms.  Negative physical exam.  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  Chronic anticoagulation- Coumadin Same dose of Coumadin.  INR in 4 weeks.  Patient wishes to come off Coumadin in the future and change to other form of anticoagulation to avoid INR checks.  Anemia Anemia panel in 1 month    THERAPY PLAN:  Continue surveillance as planned.  Continue Tamoxifen daily.  Sample will try to be sent for BCI testing.  In 1 month when he returns for his next INR, will work-up night sweats peripherally and an anemia panel.  If abnormal, will further investigate.  NCCN guidelines recommends the following surveillance for invasive breast cancer:  A. History and Physical exam every 4-6 months for 5 years and then every 12 months.  B. Mammography every 12 months  C. Women on Tamoxifen: annual gynecologic assessment every 12 months if uterus is present.  D. Women on aromatase inhibitor or who experience ovarian failure secondary to treatment should have monitoring of bone health with a bone mineral density determination at baseline and periodically  thereafter.  E. Assess and encourage adherence to adjuvant endocrine therapy.  F. Evidence suggests that active lifestyle and achieving and maintaining an ideal body weight (20-25 BMI) may lead to optimal breast cancer outcomes.   All questions were answered. The patient knows to call the clinic with any problems, questions or concerns. We can certainly see the patient much sooner if necessary.  Patient and plan discussed with Dr. Ancil Linsey and she is in agreement with the aforementioned.   This note is electronically signed by: Doy Mince 07/30/2015 3:58 PM

## 2015-07-30 NOTE — Assessment & Plan Note (Signed)
Same dose of Coumadin.  INR in 4 weeks.  Patient wishes to come off Coumadin in the future and change to other form of anticoagulation to avoid INR checks.

## 2015-07-30 NOTE — Patient Instructions (Signed)
[place] at [facility] Discharge Instructions  RECOMMENDATIONS MADE BY THE CONSULTANT AND ANY TEST RESULTS WILL BE SENT TO YOUR REFERRING PHYSICIAN.  Exam and discussion by Robynn Pane, PA-C Let us know if you want [place] to pursue the Breast Cancer Index Testing Continue coumadin same dosage Flu Vaccine today. Report any new lumps, bone pain, shortness of breath or other symptoms.  Labs in 4 weeks and office visit in 6 months.  Thank you for choosing [place] at [facility] to provide your oncology and hematology care.  To afford each patient quality time with our provider, please arrive at least 15 minutes before your scheduled appointment time.    You need to re-schedule your appointment should you arrive 10 or more minutes late.  We strive to give you quality time with our providers, and arriving late affects you and other patients whose appointments are after yours.  Also, if you no show three or more times for appointments you may be dismissed from the clinic at the providers discretion.     Again, thank you for choosing [facility].  Our hope is that these requests will decrease the amount of time that you wait before being seen by our physicians.       _____________________________________________________________  Should you have questions after your visit to [facility], please contact our office at [phone] between the hours of 8:30 a.m. and 4:30 p.m.  Voicemails left after 4:30 p.m. will not be returned until the following business day.  For prescription refill requests, have your pharmacy contact our office.

## 2015-07-30 NOTE — Assessment & Plan Note (Signed)
Attempting to send off sample for BCI testing.

## 2015-07-30 NOTE — Progress Notes (Signed)
[name] presents today for injection per MD orders. Flu vaccine administered IM in left deltoid. Administration without incident. Patient tolerated well.

## 2015-07-30 NOTE — Assessment & Plan Note (Signed)
Stage II cancer the right breast, grade 1, without LV I. ER +96%, PR 4%, Ki-67 marker low at 19%, HER-2/neu nonamplified. Oncotype score was 14. He was placed on tamoxifen 20 mg a day which he will take for 5 full years starting 03/08/2011. He is status post right mastectomy in April 2012 at which time he was found to have a 2.1 cm primary, 3 sentinel nodes were negative.  Oncology history is up-to-date.  Coming up on his 5 year anniversary of anti-endocrine therapy on May 2017.  Long discussion regarding BCI testing.  He is interested in the test to help him with his decision making regarding the length of anti-endocrine therapy. We may have issues with insurance company due to this test not being validated in male breast cancers.  However, he has a CHEK2 mutation.  He would benefit from this test.  Additionally, he wants to be off Tamoxifen to help with his hot flashes and to transition to a different anticoagulant to prevent INR checks and allow him to enjoy broadening of his diet.  We will send this test off and see what comes of the coverage.  Representative for the company is notified.  Return in 6 months for follow-up, sooner if needed.

## 2015-07-30 NOTE — Assessment & Plan Note (Addendum)
NED  Patient reported increase night sweats x 1 month.  True night sweat versus hot flash from Tamoxifen?  Timing of night sweats and administration of Tamoxifen are suspect for correlation. No B symptoms.  Negative physical exam.  Labs added for next month with his INR check: CBC diff, CMET, LDH, ESR, CRP

## 2015-08-04 ENCOUNTER — Other Ambulatory Visit (HOSPITAL_COMMUNITY): Payer: Medicare Other

## 2015-08-10 ENCOUNTER — Encounter (HOSPITAL_COMMUNITY): Payer: Self-pay

## 2015-08-23 ENCOUNTER — Ambulatory Visit (HOSPITAL_COMMUNITY)
Admission: RE | Admit: 2015-08-23 | Discharge: 2015-08-23 | Disposition: A | Discharge status: Home / Self Care | Payer: Medicare Other | Source: Ambulatory Visit | Attending: Internal Medicine | Admitting: Internal Medicine

## 2015-08-23 DIAGNOSIS — Z1231 Encounter for screening mammogram for malignant neoplasm of breast: Secondary | ICD-10-CM | POA: Diagnosis not present

## 2015-08-23 DIAGNOSIS — Z9011 Acquired absence of right breast and nipple: Secondary | ICD-10-CM | POA: Insufficient documentation

## 2015-08-23 DIAGNOSIS — Z09 Encounter for follow-up examination after completed treatment for conditions other than malignant neoplasm: Secondary | ICD-10-CM

## 2015-08-27 ENCOUNTER — Encounter (HOSPITAL_COMMUNITY): Payer: Medicare Other | Attending: Internal Medicine

## 2015-08-27 ENCOUNTER — Other Ambulatory Visit (HOSPITAL_COMMUNITY): Payer: Self-pay | Admitting: Oncology

## 2015-08-27 DIAGNOSIS — C50929 Malignant neoplasm of unspecified site of unspecified male breast: Secondary | ICD-10-CM | POA: Insufficient documentation

## 2015-08-27 DIAGNOSIS — Z8041 Family history of malignant neoplasm of ovary: Secondary | ICD-10-CM | POA: Diagnosis not present

## 2015-08-27 DIAGNOSIS — I6522 Occlusion and stenosis of left carotid artery: Secondary | ICD-10-CM

## 2015-08-27 DIAGNOSIS — C8190 Hodgkin lymphoma, unspecified, unspecified site: Secondary | ICD-10-CM | POA: Insufficient documentation

## 2015-08-27 DIAGNOSIS — Z951 Presence of aortocoronary bypass graft: Secondary | ICD-10-CM | POA: Insufficient documentation

## 2015-08-27 DIAGNOSIS — Z803 Family history of malignant neoplasm of breast: Secondary | ICD-10-CM | POA: Insufficient documentation

## 2015-08-27 DIAGNOSIS — D649 Anemia, unspecified: Secondary | ICD-10-CM

## 2015-08-27 DIAGNOSIS — E538 Deficiency of other specified B group vitamins: Secondary | ICD-10-CM

## 2015-08-27 DIAGNOSIS — Z7901 Long term (current) use of anticoagulants: Secondary | ICD-10-CM | POA: Insufficient documentation

## 2015-08-27 DIAGNOSIS — E611 Iron deficiency: Secondary | ICD-10-CM

## 2015-08-27 DIAGNOSIS — C50919 Malignant neoplasm of unspecified site of unspecified female breast: Secondary | ICD-10-CM

## 2015-08-27 LAB — IRON AND TIBC
Iron: 62 ug/dL (ref 45–182)
SATURATION RATIOS: 15 % — AB (ref 17.9–39.5)
TIBC: 416 ug/dL (ref 250–450)
UIBC: 354 ug/dL

## 2015-08-27 LAB — C-REACTIVE PROTEIN: CRP: 1 mg/dL — AB (ref <1.0)

## 2015-08-27 LAB — CBC WITH DIFFERENTIAL/PLATELET
Basophils Absolute: 0.1 10*3/uL (ref 0.0–0.1)
Basophils Relative: 1 %
EOS ABS: 0.2 10*3/uL (ref 0.0–0.7)
EOS PCT: 3 %
HCT: 43.3 % (ref 39.0–52.0)
HEMOGLOBIN: 13.8 g/dL (ref 13.0–17.0)
LYMPHS ABS: 2.2 10*3/uL (ref 0.7–4.0)
LYMPHS PCT: 26 %
MCH: 29.2 pg (ref 26.0–34.0)
MCHC: 31.9 g/dL (ref 30.0–36.0)
MCV: 91.7 fL (ref 78.0–100.0)
MONOS PCT: 7 %
Monocytes Absolute: 0.6 10*3/uL (ref 0.1–1.0)
NEUTROS PCT: 63 %
Neutro Abs: 5.4 10*3/uL (ref 1.7–7.7)
Platelets: 226 10*3/uL (ref 150–400)
RBC: 4.72 MIL/uL (ref 4.22–5.81)
RDW: 13.9 % (ref 11.5–15.5)
WBC: 8.6 10*3/uL (ref 4.0–10.5)

## 2015-08-27 LAB — PROTIME-INR
INR: 3.14 — ABNORMAL HIGH (ref 0.00–1.49)
Prothrombin Time: 31.7 seconds — ABNORMAL HIGH (ref 11.6–15.2)

## 2015-08-27 LAB — FOLATE: Folate: 14.5 ng/mL (ref >5.9)

## 2015-08-27 LAB — COMPREHENSIVE METABOLIC PANEL
ALK PHOS: 36 U/L — AB (ref 38–126)
ALT: 14 U/L — AB (ref 17–63)
ANION GAP: 9 (ref 5–15)
AST: 21 U/L (ref 15–41)
Albumin: 3.5 g/dL (ref 3.5–5.0)
BUN: 20 mg/dL (ref 6–20)
CALCIUM: 8.6 mg/dL — AB (ref 8.9–10.3)
CO2: 25 mmol/L (ref 22–32)
CREATININE: 1.55 mg/dL — AB (ref 0.61–1.24)
Chloride: 107 mmol/L (ref 101–111)
GFR, EST AFRICAN AMERICAN: 52 mL/min — AB (ref >60)
GFR, EST NON AFRICAN AMERICAN: 45 mL/min — AB (ref >60)
Glucose, Bld: 218 mg/dL — ABNORMAL HIGH (ref 65–99)
Potassium: 4.2 mmol/L (ref 3.5–5.1)
SODIUM: 141 mmol/L (ref 135–145)
Total Bilirubin: 0.5 mg/dL (ref 0.3–1.2)
Total Protein: 6.5 g/dL (ref 6.5–8.1)

## 2015-08-27 LAB — VITAMIN B12: VITAMIN B 12: 250 pg/mL (ref 180–914)

## 2015-08-27 LAB — SEDIMENTATION RATE: SED RATE: 2 mm/h (ref 0–16)

## 2015-08-27 LAB — LACTATE DEHYDROGENASE: LDH: 155 U/L (ref 98–192)

## 2015-08-27 LAB — FERRITIN: FERRITIN: 25 ng/mL (ref 24–336)

## 2015-08-27 MED ORDER — POLYSACCHARIDE IRON COMPLEX 150 MG PO CAPS: 150.0000 mg | ORAL_CAPSULE | Freq: Every day | ORAL | Status: DC

## 2015-08-30 ENCOUNTER — Encounter (HOSPITAL_COMMUNITY): Payer: Self-pay

## 2015-08-30 ENCOUNTER — Encounter (HOSPITAL_BASED_OUTPATIENT_CLINIC_OR_DEPARTMENT_OTHER): Payer: Medicare Other

## 2015-08-30 VITALS — BP 113/93 | HR 79 | Resp 20

## 2015-08-30 DIAGNOSIS — I6521 Occlusion and stenosis of right carotid artery: Secondary | ICD-10-CM

## 2015-08-30 DIAGNOSIS — E538 Deficiency of other specified B group vitamins: Secondary | ICD-10-CM

## 2015-08-30 DIAGNOSIS — C50929 Malignant neoplasm of unspecified site of unspecified male breast: Secondary | ICD-10-CM | POA: Diagnosis not present

## 2015-08-30 DIAGNOSIS — Z951 Presence of aortocoronary bypass graft: Secondary | ICD-10-CM | POA: Diagnosis not present

## 2015-08-30 DIAGNOSIS — C8190 Hodgkin lymphoma, unspecified, unspecified site: Secondary | ICD-10-CM | POA: Diagnosis not present

## 2015-08-30 DIAGNOSIS — Z803 Family history of malignant neoplasm of breast: Secondary | ICD-10-CM | POA: Diagnosis not present

## 2015-08-30 DIAGNOSIS — Z7901 Long term (current) use of anticoagulants: Secondary | ICD-10-CM | POA: Diagnosis not present

## 2015-08-30 DIAGNOSIS — Z8041 Family history of malignant neoplasm of ovary: Secondary | ICD-10-CM | POA: Diagnosis not present

## 2015-08-30 MED ORDER — CYANOCOBALAMIN 1000 MCG/ML IJ SOLN
1000.0000 ug | Freq: Once | INTRAMUSCULAR | Status: AC
  Administered 2015-08-30: 1000 ug via INTRAMUSCULAR

## 2015-08-30 MED ORDER — CYANOCOBALAMIN 1000 MCG/ML IJ SOLN
INTRAMUSCULAR | Status: AC
  Filled 2015-08-30: qty 1

## 2015-08-30 NOTE — Progress Notes (Signed)
LABS DRAWN

## 2015-08-30 NOTE — Patient Instructions (Signed)
[place] at [facility] Discharge Instructions  RECOMMENDATIONS MADE BY THE CONSULTANT AND ANY TEST RESULTS WILL BE SENT TO YOUR REFERRING PHYSICIAN.  B12 injection done today per orders Patient tolerated well. Follow up as scheduled.  Thank you for choosing [place] at [facility] to provide your oncology and hematology care.  To afford each patient quality time with our provider, please arrive at least 15 minutes before your scheduled appointment time.    You need to re-schedule your appointment should you arrive 10 or more minutes late.  We strive to give you quality time with our providers, and arriving late affects you and other patients whose appointments are after yours.  Also, if you no show three or more times for appointments you may be dismissed from the clinic at the providers discretion.     Again, thank you for choosing [facility].  Our hope is that these requests will decrease the amount of time that you wait before being seen by our physicians.       _____________________________________________________________  Should you have questions after your visit to [facility], please contact our office at [phone] between the hours of 8:30 a.m. and 4:30 p.m.  Voicemails left after 4:30 p.m. will not be returned until the following business day.  For prescription refill requests, have your pharmacy contact our office.

## 2015-08-30 NOTE — Progress Notes (Signed)
Ryan [name] presents today for injection per MD orders. B12 1086mcg administered SQ in left Upper Arm. Administration without incident. Patient tolerated well.

## 2015-08-31 LAB — INTRINSIC FACTOR ANTIBODIES: Intrinsic Factor: 1 AU/mL (ref 0.0–1.1)

## 2015-08-31 LAB — ANTI-PARIETAL ANTIBODY: Parietal Cell Antibody-IgG: 2.4 Units (ref 0.0–20.0)

## 2015-09-08 ENCOUNTER — Encounter (HOSPITAL_BASED_OUTPATIENT_CLINIC_OR_DEPARTMENT_OTHER): Payer: Medicare Other

## 2015-09-08 DIAGNOSIS — E538 Deficiency of other specified B group vitamins: Secondary | ICD-10-CM

## 2015-09-08 MED ORDER — CYANOCOBALAMIN 1000 MCG/ML IJ SOLN
1000.0000 ug | Freq: Once | INTRAMUSCULAR | Status: AC
  Administered 2015-09-08: 1000 ug via INTRAMUSCULAR

## 2015-09-08 MED ORDER — CYANOCOBALAMIN 1000 MCG/ML IJ SOLN
INTRAMUSCULAR | Status: AC
  Filled 2015-09-08: qty 1

## 2015-09-08 NOTE — Progress Notes (Signed)
[name] presents today for injection per MD orders. B12 1000 mcg administered IM in right Upper Arm. Administration without incident. Patient tolerated well.

## 2015-09-08 NOTE — Patient Instructions (Signed)
[place] at [facility] Discharge Instructions  RECOMMENDATIONS MADE BY THE CONSULTANT AND ANY TEST RESULTS WILL BE SENT TO YOUR REFERRING PHYSICIAN.  Vitamin B12 1000 mcg injection given today as ordered. 2 more weekly injections of vitamin B12 are scheduled. After 4th B12 injection you are to start taking vitamin B12 1000 mcg tablets by mouth daily. Return as scheduled.  Thank you for choosing [place] at [facility] to provide your oncology and hematology care.  To afford each patient quality time with our provider, please arrive at least 15 minutes before your scheduled appointment time.    You need to re-schedule your appointment should you arrive 10 or more minutes late.  We strive to give you quality time with our providers, and arriving late affects you and other patients whose appointments are after yours.  Also, if you no show three or more times for appointments you may be dismissed from the clinic at the providers discretion.     Again, thank you for choosing [facility].  Our hope is that these requests will decrease the amount of time that you wait before being seen by our physicians.       _____________________________________________________________  Should you have questions after your visit to [facility], please contact our office at [phone] between the hours of 8:30 a.m. and 4:30 p.m.  Voicemails left after 4:30 p.m. will not be returned until the following business day.  For prescription refill requests, have your pharmacy contact our office.

## 2015-09-09 ENCOUNTER — Encounter (HOSPITAL_COMMUNITY): Payer: Medicare Other | Attending: Internal Medicine

## 2015-09-09 DIAGNOSIS — Z7901 Long term (current) use of anticoagulants: Secondary | ICD-10-CM | POA: Insufficient documentation

## 2015-09-09 DIAGNOSIS — C50929 Malignant neoplasm of unspecified site of unspecified male breast: Secondary | ICD-10-CM | POA: Diagnosis not present

## 2015-09-09 DIAGNOSIS — C8190 Hodgkin lymphoma, unspecified, unspecified site: Secondary | ICD-10-CM | POA: Diagnosis not present

## 2015-09-09 DIAGNOSIS — Z803 Family history of malignant neoplasm of breast: Secondary | ICD-10-CM | POA: Insufficient documentation

## 2015-09-09 DIAGNOSIS — Z8041 Family history of malignant neoplasm of ovary: Secondary | ICD-10-CM | POA: Diagnosis not present

## 2015-09-09 DIAGNOSIS — I6522 Occlusion and stenosis of left carotid artery: Secondary | ICD-10-CM

## 2015-09-09 DIAGNOSIS — Z951 Presence of aortocoronary bypass graft: Secondary | ICD-10-CM | POA: Insufficient documentation

## 2015-09-09 DIAGNOSIS — C50919 Malignant neoplasm of unspecified site of unspecified female breast: Secondary | ICD-10-CM

## 2015-09-09 LAB — PROTIME-INR
INR: 2.21 — AB (ref 0.00–1.49)
Prothrombin Time: 24.4 seconds — ABNORMAL HIGH (ref 11.6–15.2)

## 2015-09-09 NOTE — Progress Notes (Signed)
LABS DRAWN

## 2015-09-10 ENCOUNTER — Other Ambulatory Visit (HOSPITAL_COMMUNITY): Payer: Medicare Other

## 2015-09-15 ENCOUNTER — Encounter (HOSPITAL_BASED_OUTPATIENT_CLINIC_OR_DEPARTMENT_OTHER): Payer: Medicare Other

## 2015-09-15 ENCOUNTER — Encounter (HOSPITAL_COMMUNITY): Payer: Self-pay

## 2015-09-15 VITALS — BP 134/69 | HR 87 | Temp 98.4°F | Resp 18

## 2015-09-15 DIAGNOSIS — I10 Essential (primary) hypertension: Secondary | ICD-10-CM | POA: Diagnosis not present

## 2015-09-15 DIAGNOSIS — N183 Chronic kidney disease, stage 3 (moderate): Secondary | ICD-10-CM | POA: Diagnosis not present

## 2015-09-15 DIAGNOSIS — D649 Anemia, unspecified: Secondary | ICD-10-CM

## 2015-09-15 DIAGNOSIS — J984 Other disorders of lung: Secondary | ICD-10-CM | POA: Diagnosis not present

## 2015-09-15 MED ORDER — CYANOCOBALAMIN 1000 MCG/ML IJ SOLN
INTRAMUSCULAR | Status: AC
  Filled 2015-09-15: qty 1

## 2015-09-15 MED ORDER — CYANOCOBALAMIN 1000 MCG/ML IJ SOLN
1000.0000 ug | Freq: Once | INTRAMUSCULAR | Status: AC
  Administered 2015-09-15: 1000 ug via INTRAMUSCULAR

## 2015-09-15 NOTE — Progress Notes (Signed)
..  Ryan [name] presents today for injection per the provider's orders.  Vitamin b12 administration without incident; see MAR for injection details.  Patient tolerated procedure well and without incident.  No questions or complaints noted at this time.

## 2015-09-20 ENCOUNTER — Other Ambulatory Visit (HOSPITAL_COMMUNITY): Payer: Self-pay

## 2015-09-22 ENCOUNTER — Encounter (HOSPITAL_BASED_OUTPATIENT_CLINIC_OR_DEPARTMENT_OTHER): Payer: Medicare Other

## 2015-09-22 VITALS — BP 142/66 | HR 88 | Temp 98.4°F | Resp 17

## 2015-09-22 DIAGNOSIS — E538 Deficiency of other specified B group vitamins: Secondary | ICD-10-CM | POA: Diagnosis present

## 2015-09-22 DIAGNOSIS — D649 Anemia, unspecified: Secondary | ICD-10-CM

## 2015-09-22 MED ORDER — CYANOCOBALAMIN 1000 MCG/ML IJ SOLN
1000.0000 ug | Freq: Once | INTRAMUSCULAR | Status: AC
  Administered 2015-09-22: 1000 ug via INTRAMUSCULAR

## 2015-09-22 MED ORDER — CYANOCOBALAMIN 1000 MCG/ML IJ SOLN
INTRAMUSCULAR | Status: AC
  Filled 2015-09-22: qty 1

## 2015-09-22 NOTE — Patient Instructions (Signed)
[place] at [name] Discharge Instructions  RECOMMENDATIONS MADE BY THE CONSULTANT AND ANY TEST RESULTS WILL BE SENT TO YOUR REFERRING PHYSICIAN.  B12 today.  Return as scheduled for labs and follow up.    Thank you for choosing [place] at [facility] to provide your oncology and hematology care.  To afford each patient quality time with our provider, please arrive at least 15 minutes before your scheduled appointment time.    You need to re-schedule your appointment should you arrive 10 or more minutes late.  We strive to give you quality time with our providers, and arriving late affects you and other patients whose appointments are after yours.  Also, if you no show three or more times for appointments you may be dismissed from the clinic at the providers discretion.     Again, thank you for choosing [facility].  Our hope is that these requests will decrease the amount of time that you wait before being seen by our physicians.       _____________________________________________________________  Should you have questions after your visit to [facility] please contact our office at [phone] between the hours of 8:30 a.m. and 4:30 p.m.  Voicemails left after 4:30 p.m. will not be returned until the following business day.  For prescription refill requests, have your pharmacy contact our office.

## 2015-09-22 NOTE — Progress Notes (Signed)
Ryan [name] presents today for injection per MD orders. B12 1033mcg administered IM in left Upper Arm. Administration without incident. Patient tolerated well.

## 2015-09-25 ENCOUNTER — Other Ambulatory Visit: Payer: Self-pay | Admitting: Cardiology

## 2015-09-30 ENCOUNTER — Encounter (HOSPITAL_COMMUNITY): Payer: Medicare Other

## 2015-09-30 ENCOUNTER — Other Ambulatory Visit (HOSPITAL_COMMUNITY): Payer: Self-pay | Admitting: Oncology

## 2015-09-30 DIAGNOSIS — C8190 Hodgkin lymphoma, unspecified, unspecified site: Secondary | ICD-10-CM | POA: Diagnosis not present

## 2015-09-30 DIAGNOSIS — C50929 Malignant neoplasm of unspecified site of unspecified male breast: Secondary | ICD-10-CM | POA: Diagnosis not present

## 2015-09-30 DIAGNOSIS — Z7901 Long term (current) use of anticoagulants: Secondary | ICD-10-CM | POA: Diagnosis not present

## 2015-09-30 DIAGNOSIS — Z951 Presence of aortocoronary bypass graft: Secondary | ICD-10-CM | POA: Diagnosis not present

## 2015-09-30 DIAGNOSIS — Z8041 Family history of malignant neoplasm of ovary: Secondary | ICD-10-CM | POA: Diagnosis not present

## 2015-09-30 DIAGNOSIS — I6522 Occlusion and stenosis of left carotid artery: Secondary | ICD-10-CM

## 2015-09-30 DIAGNOSIS — Z803 Family history of malignant neoplasm of breast: Secondary | ICD-10-CM | POA: Diagnosis not present

## 2015-09-30 DIAGNOSIS — C50919 Malignant neoplasm of unspecified site of unspecified female breast: Secondary | ICD-10-CM

## 2015-09-30 LAB — PROTIME-INR
INR: 4.05 — AB (ref 0.00–1.49)
PROTHROMBIN TIME: 38.4 s — AB (ref 11.6–15.2)

## 2015-10-06 ENCOUNTER — Encounter (HOSPITAL_COMMUNITY): Payer: Medicare Other

## 2015-10-06 ENCOUNTER — Telehealth (HOSPITAL_COMMUNITY): Payer: Self-pay

## 2015-10-06 DIAGNOSIS — Z803 Family history of malignant neoplasm of breast: Secondary | ICD-10-CM | POA: Diagnosis not present

## 2015-10-06 DIAGNOSIS — C50929 Malignant neoplasm of unspecified site of unspecified male breast: Secondary | ICD-10-CM | POA: Diagnosis not present

## 2015-10-06 DIAGNOSIS — Z8041 Family history of malignant neoplasm of ovary: Secondary | ICD-10-CM | POA: Diagnosis not present

## 2015-10-06 DIAGNOSIS — C50919 Malignant neoplasm of unspecified site of unspecified female breast: Secondary | ICD-10-CM

## 2015-10-06 DIAGNOSIS — Z951 Presence of aortocoronary bypass graft: Secondary | ICD-10-CM | POA: Diagnosis not present

## 2015-10-06 DIAGNOSIS — Z7901 Long term (current) use of anticoagulants: Secondary | ICD-10-CM | POA: Diagnosis not present

## 2015-10-06 DIAGNOSIS — I6522 Occlusion and stenosis of left carotid artery: Secondary | ICD-10-CM

## 2015-10-06 DIAGNOSIS — C8190 Hodgkin lymphoma, unspecified, unspecified site: Secondary | ICD-10-CM | POA: Diagnosis not present

## 2015-10-06 LAB — PROTIME-INR
INR: 3.59 — AB (ref 0.00–1.49)
Prothrombin Time: 35 seconds — ABNORMAL HIGH (ref 11.6–15.2)

## 2015-10-06 NOTE — Telephone Encounter (Signed)
Spoke with patient and instructed to hold coumadin for 1 day then resume same dosage and to return for next PT/INR on 10/12/15.  Verbalizes understanding.

## 2015-10-12 ENCOUNTER — Encounter (HOSPITAL_COMMUNITY): Payer: Medicare Other | Attending: Internal Medicine

## 2015-10-12 ENCOUNTER — Other Ambulatory Visit (HOSPITAL_COMMUNITY): Payer: Self-pay | Admitting: Oncology

## 2015-10-12 ENCOUNTER — Other Ambulatory Visit (HOSPITAL_COMMUNITY): Payer: Self-pay | Admitting: Emergency Medicine

## 2015-10-12 DIAGNOSIS — Z7901 Long term (current) use of anticoagulants: Secondary | ICD-10-CM | POA: Insufficient documentation

## 2015-10-12 DIAGNOSIS — C8190 Hodgkin lymphoma, unspecified, unspecified site: Secondary | ICD-10-CM | POA: Insufficient documentation

## 2015-10-12 DIAGNOSIS — Z951 Presence of aortocoronary bypass graft: Secondary | ICD-10-CM | POA: Insufficient documentation

## 2015-10-12 DIAGNOSIS — C50929 Malignant neoplasm of unspecified site of unspecified male breast: Secondary | ICD-10-CM | POA: Diagnosis not present

## 2015-10-12 DIAGNOSIS — I6522 Occlusion and stenosis of left carotid artery: Secondary | ICD-10-CM | POA: Diagnosis present

## 2015-10-12 DIAGNOSIS — Z8041 Family history of malignant neoplasm of ovary: Secondary | ICD-10-CM | POA: Diagnosis not present

## 2015-10-12 DIAGNOSIS — Z803 Family history of malignant neoplasm of breast: Secondary | ICD-10-CM | POA: Diagnosis not present

## 2015-10-12 DIAGNOSIS — C50919 Malignant neoplasm of unspecified site of unspecified female breast: Secondary | ICD-10-CM

## 2015-10-12 LAB — PROTIME-INR
INR: 3.63 — ABNORMAL HIGH (ref 0.00–1.49)
PROTHROMBIN TIME: 35.3 s — AB (ref 11.6–15.2)

## 2015-10-12 NOTE — Progress Notes (Signed)
Notified ot to hold coumadin a 1 day, take 3mg /3mg /4mg  then repeat. Recheck PT on friday

## 2015-10-14 NOTE — Progress Notes (Signed)
LABS DRAWN

## 2015-10-15 ENCOUNTER — Encounter (HOSPITAL_COMMUNITY): Payer: Medicare Other

## 2015-10-15 ENCOUNTER — Encounter (HOSPITAL_COMMUNITY): Payer: Self-pay | Admitting: Oncology

## 2015-10-15 ENCOUNTER — Encounter (HOSPITAL_BASED_OUTPATIENT_CLINIC_OR_DEPARTMENT_OTHER): Payer: Medicare Other | Admitting: Oncology

## 2015-10-15 VITALS — BP 135/93 | HR 71 | Temp 97.6°F | Resp 20 | Wt 249.8 lb

## 2015-10-15 DIAGNOSIS — C50919 Malignant neoplasm of unspecified site of unspecified female breast: Secondary | ICD-10-CM

## 2015-10-15 DIAGNOSIS — L989 Disorder of the skin and subcutaneous tissue, unspecified: Secondary | ICD-10-CM

## 2015-10-15 DIAGNOSIS — D649 Anemia, unspecified: Secondary | ICD-10-CM

## 2015-10-15 DIAGNOSIS — Z7901 Long term (current) use of anticoagulants: Secondary | ICD-10-CM | POA: Diagnosis not present

## 2015-10-15 DIAGNOSIS — C50929 Malignant neoplasm of unspecified site of unspecified male breast: Secondary | ICD-10-CM | POA: Diagnosis not present

## 2015-10-15 DIAGNOSIS — Z8041 Family history of malignant neoplasm of ovary: Secondary | ICD-10-CM | POA: Diagnosis not present

## 2015-10-15 DIAGNOSIS — Z951 Presence of aortocoronary bypass graft: Secondary | ICD-10-CM | POA: Diagnosis not present

## 2015-10-15 DIAGNOSIS — C8190 Hodgkin lymphoma, unspecified, unspecified site: Secondary | ICD-10-CM | POA: Diagnosis not present

## 2015-10-15 DIAGNOSIS — Z803 Family history of malignant neoplasm of breast: Secondary | ICD-10-CM | POA: Diagnosis not present

## 2015-10-15 DIAGNOSIS — I6522 Occlusion and stenosis of left carotid artery: Secondary | ICD-10-CM

## 2015-10-15 LAB — COMPREHENSIVE METABOLIC PANEL
ALT: 15 U/L — ABNORMAL LOW (ref 17–63)
ANION GAP: 9 (ref 5–15)
AST: 21 U/L (ref 15–41)
Albumin: 3.5 g/dL (ref 3.5–5.0)
Alkaline Phosphatase: 41 U/L (ref 38–126)
BUN: 12 mg/dL (ref 6–20)
CALCIUM: 8.6 mg/dL — AB (ref 8.9–10.3)
CHLORIDE: 106 mmol/L (ref 101–111)
CO2: 26 mmol/L (ref 22–32)
Creatinine, Ser: 1.31 mg/dL — ABNORMAL HIGH (ref 0.61–1.24)
GFR, EST NON AFRICAN AMERICAN: 55 mL/min — AB (ref >60)
Glucose, Bld: 145 mg/dL — ABNORMAL HIGH (ref 65–99)
Potassium: 4.2 mmol/L (ref 3.5–5.1)
SODIUM: 141 mmol/L (ref 135–145)
Total Bilirubin: 0.4 mg/dL (ref 0.3–1.2)
Total Protein: 6.4 g/dL — ABNORMAL LOW (ref 6.5–8.1)

## 2015-10-15 LAB — CBC WITH DIFFERENTIAL/PLATELET
Basophils Absolute: 0.1 10*3/uL (ref 0.0–0.1)
Basophils Relative: 1 %
EOS ABS: 0.3 10*3/uL (ref 0.0–0.7)
EOS PCT: 4 %
HCT: 43.6 % (ref 39.0–52.0)
Hemoglobin: 14.1 g/dL (ref 13.0–17.0)
LYMPHS ABS: 2.3 10*3/uL (ref 0.7–4.0)
Lymphocytes Relative: 27 %
MCH: 29.6 pg (ref 26.0–34.0)
MCHC: 32.3 g/dL (ref 30.0–36.0)
MCV: 91.6 fL (ref 78.0–100.0)
MONO ABS: 0.5 10*3/uL (ref 0.1–1.0)
MONOS PCT: 6 %
Neutro Abs: 5.5 10*3/uL (ref 1.7–7.7)
Neutrophils Relative %: 62 %
PLATELETS: 204 10*3/uL (ref 150–400)
RBC: 4.76 MIL/uL (ref 4.22–5.81)
RDW: 13.4 % (ref 11.5–15.5)
WBC: 8.8 10*3/uL (ref 4.0–10.5)

## 2015-10-15 LAB — PROTIME-INR
INR: 2.71 — ABNORMAL HIGH (ref 0.00–1.49)
Prothrombin Time: 28.4 seconds — ABNORMAL HIGH (ref 11.6–15.2)

## 2015-10-15 NOTE — Progress Notes (Signed)
Strider is seen as a work-in today.  He was here for an INR check for his Vitamin K Antagonist.  His INR is 2.71 and he is currently on Warfarin 3, 3, 4 mg repeating.  He will continue with the same dose and we will repeat an INR in 2 weeks.  He is seen as a work-in due to a progressive lesion/rash.  He notes a history of a brown recluse spider bite on his left lower leg in 2004.  This resulted in a chronic skin lesion that persisted all of these years.  Since starting B12 and PO iron, he notes that this area has enlarged with a pruritic rash.  He is being seen as a result.  He and his wife want some education regarding the role of B12 replacement and iron replacement given his normal B12 and ferritin level.  I personally reviewed and went over laboratory results with the patient.  The results are noted within this dictation.  His B12 level was noted to be low-normal at 250.  As a result antibody testing was completed and found to be negative.  Additionally, his ferritin was low-normal in the setting of a previous blood test showing an anemia.  As a result, he was recalled for a peripheral work-up for anemia.  In the interim, his HGB increased and was WNL, but his ferritin, as noted above, was 25.  He is educated on the role of iron in blood production.  He is educated on iron deficiency anemia and iron deficiency without anemia.  He is tolerating PO iron replacement without any complications, but notes darkening of his stools.   He also wants to know the results of BCI testing if it is available.  He notes that his insurance covered this test.  He has a low risk of recurrence in addition to low benefit to extended endocrine therapy.  This is good for him as he can stop his Tamoxifen at his 5 year mark.  He is interested, at that time, to change his anticoagulation regimen to avoid regular blood testing.     Left lower leg.  Lesion measures 7 cm proximal-to-distal and about 3 cm laterally.  There is some  evidence of excoriation distally on the medial aspect of his ankle.  It is not tender and nonblanchable.  It is pruritic.  He does have some flaking of skin at the site of the rash.  Additionally, he has a petechial rash surrounding this lesion, distal to his knee, mainly on the anterior aspect of his lower leg.     Right lower leg with petechial rash that is pruritic with evidence of excoriations.  He will hold both B12 and iron supplementation.  I cannot confirm that either of these medications are contributing to his findings. There is no known evidence that either of these medication can cause a rash, but maybe it is from a filler in the iron replacement.  He will return in 1 month for follow-up regarding this rash.  He can treat the pruritis symptomatically.  If not better or worse, we may need to consider a punch biopsy.  However, if his rash progresses, he is to contact us for a work-in appointment.   We will keep his other appointments as scheduled for now.  Patient and plan discussed with Dr. Ancil Linsey and she is in agreement with the aforementioned.   Robynn Pane, PA-C 10/15/2015 5:33 PM

## 2015-10-15 NOTE — Patient Instructions (Addendum)
[place] at [facility] Discharge Instructions  RECOMMENDATIONS MADE BY THE CONSULTANT AND ANY TEST RESULTS WILL BE SENT TO YOUR REFERRING PHYSICIAN.  Exam and discussion by Robynn Pane, PA-C Will stop the B12 injections and iron for now.   If rash worsens let us know and we will get you back to do a skin biopsy. Continue coumadin same dosage PT/INR in 2 weeks.   Follow-up in 1 month for re-check on leg.  Keep other scheduled appointments.  Thank you for choosing [place] at [facility] to provide your oncology and hematology care.  To afford each patient quality time with our provider, please arrive at least 15 minutes before your scheduled appointment time.    You need to re-schedule your appointment should you arrive 10 or more minutes late.  We strive to give you quality time with our providers, and arriving late affects you and other patients whose appointments are after yours.  Also, if you no show three or more times for appointments you may be dismissed from the clinic at the providers discretion.     Again, thank you for choosing [facility].  Our hope is that these requests will decrease the amount of time that you wait before being seen by our physicians.       _____________________________________________________________  Should you have questions after your visit to [facility], please contact our office at [phone] between the hours of 8:30 a.m. and 4:30 p.m.  Voicemails left after 4:30 p.m. will not be returned until the following business day.  For prescription refill requests, have your pharmacy contact our office.

## 2015-10-29 ENCOUNTER — Telehealth (HOSPITAL_COMMUNITY): Payer: Self-pay | Admitting: *Deleted

## 2015-10-29 ENCOUNTER — Encounter (HOSPITAL_COMMUNITY): Payer: Medicare Other

## 2015-10-29 DIAGNOSIS — C50929 Malignant neoplasm of unspecified site of unspecified male breast: Secondary | ICD-10-CM | POA: Diagnosis not present

## 2015-10-29 DIAGNOSIS — C8190 Hodgkin lymphoma, unspecified, unspecified site: Secondary | ICD-10-CM | POA: Diagnosis not present

## 2015-10-29 DIAGNOSIS — C50919 Malignant neoplasm of unspecified site of unspecified female breast: Secondary | ICD-10-CM

## 2015-10-29 DIAGNOSIS — Z803 Family history of malignant neoplasm of breast: Secondary | ICD-10-CM | POA: Diagnosis not present

## 2015-10-29 DIAGNOSIS — Z7901 Long term (current) use of anticoagulants: Secondary | ICD-10-CM | POA: Diagnosis not present

## 2015-10-29 DIAGNOSIS — I6522 Occlusion and stenosis of left carotid artery: Secondary | ICD-10-CM

## 2015-10-29 DIAGNOSIS — Z951 Presence of aortocoronary bypass graft: Secondary | ICD-10-CM | POA: Diagnosis not present

## 2015-10-29 DIAGNOSIS — Z8041 Family history of malignant neoplasm of ovary: Secondary | ICD-10-CM | POA: Diagnosis not present

## 2015-10-29 LAB — PROTIME-INR
INR: 2.83 — ABNORMAL HIGH (ref 0.00–1.49)
PROTHROMBIN TIME: 29.3 s — AB (ref 11.6–15.2)

## 2015-10-29 NOTE — Telephone Encounter (Signed)
Patient notified

## 2015-11-12 ENCOUNTER — Encounter (HOSPITAL_COMMUNITY): Payer: Self-pay | Admitting: Oncology

## 2015-11-12 ENCOUNTER — Encounter (HOSPITAL_COMMUNITY): Payer: Medicare Other | Attending: Internal Medicine | Admitting: Oncology

## 2015-11-12 VITALS — BP 135/64 | HR 83 | Temp 98.0°F | Resp 18 | Wt 245.2 lb

## 2015-11-12 DIAGNOSIS — Z7901 Long term (current) use of anticoagulants: Secondary | ICD-10-CM | POA: Insufficient documentation

## 2015-11-12 DIAGNOSIS — Z803 Family history of malignant neoplasm of breast: Secondary | ICD-10-CM | POA: Diagnosis not present

## 2015-11-12 DIAGNOSIS — Z8041 Family history of malignant neoplasm of ovary: Secondary | ICD-10-CM | POA: Insufficient documentation

## 2015-11-12 DIAGNOSIS — Z8571 Personal history of Hodgkin lymphoma: Secondary | ICD-10-CM

## 2015-11-12 DIAGNOSIS — D649 Anemia, unspecified: Secondary | ICD-10-CM

## 2015-11-12 DIAGNOSIS — C8190 Hodgkin lymphoma, unspecified, unspecified site: Secondary | ICD-10-CM

## 2015-11-12 DIAGNOSIS — E611 Iron deficiency: Secondary | ICD-10-CM

## 2015-11-12 DIAGNOSIS — C50921 Malignant neoplasm of unspecified site of right male breast: Secondary | ICD-10-CM

## 2015-11-12 DIAGNOSIS — C50929 Malignant neoplasm of unspecified site of unspecified male breast: Secondary | ICD-10-CM | POA: Insufficient documentation

## 2015-11-12 DIAGNOSIS — Z1509 Genetic susceptibility to other malignant neoplasm: Secondary | ICD-10-CM

## 2015-11-12 DIAGNOSIS — Z951 Presence of aortocoronary bypass graft: Secondary | ICD-10-CM | POA: Diagnosis not present

## 2015-11-12 DIAGNOSIS — E538 Deficiency of other specified B group vitamins: Secondary | ICD-10-CM

## 2015-11-12 DIAGNOSIS — Z1589 Genetic susceptibility to other disease: Secondary | ICD-10-CM

## 2015-11-12 DIAGNOSIS — C50911 Malignant neoplasm of unspecified site of right female breast: Secondary | ICD-10-CM

## 2015-11-12 DIAGNOSIS — C50919 Malignant neoplasm of unspecified site of unspecified female breast: Secondary | ICD-10-CM

## 2015-11-12 DIAGNOSIS — Z1502 Genetic susceptibility to malignant neoplasm of ovary: Secondary | ICD-10-CM

## 2015-11-12 DIAGNOSIS — I6522 Occlusion and stenosis of left carotid artery: Secondary | ICD-10-CM

## 2015-11-12 LAB — PROTIME-INR
INR: 2.65 — AB (ref 0.00–1.49)
PROTHROMBIN TIME: 27.9 s — AB (ref 11.6–15.2)

## 2015-11-12 NOTE — Assessment & Plan Note (Signed)
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  Negative genetic testing on the remainder of the genes: ATM, BARD1, BRCA1, BRCA2, BRIP1, CDH1, EPCAM, FANCC, MLH1, MSH2, MSH6, NBN, PALB2, PMS2, PTEN, RAD51C, RAD51D, STK11, TP53, and XRCC2.  The report date is:  September 07, 2014.

## 2015-11-12 NOTE — Assessment & Plan Note (Signed)
NED

## 2015-11-12 NOTE — Assessment & Plan Note (Addendum)
Normocytic, normochromic anemia.  Noted in October 2016.  None since.  Anemia work-up demonstrates a low-normal ferritin and B12.  Patient was started on oral Niferex.  Anti-parietal cell and intrinsic factor antibody testing completed on 08/30/2015, both negative.  He is S/P 4 weekly B12 injections finishing on 09/22/2015 with transition to PO B12 replacement.  HOWEVER, after starting both PO iron and PO B12, he developed a LE rash that is previously documented.  As a result, both medications were discontinued.    Oncology Flowsheet 08/30/2015 09/08/2015 09/15/2015  cyanocobalamin ((VITAMIN B-12)) IM 1,000 mcg 1,000 mcg 1,000 mcg   Oncology Flowsheet 09/22/2015  cyanocobalamin ((VITAMIN B-12)) IM 1,000 mcg    Labs in March 2017: ferritin, B12  Based upon March lab work, I will consider which medication to restart (iron or B12) depending on results.

## 2015-11-12 NOTE — Assessment & Plan Note (Signed)
Same dose of Coumadin.  INR in 4 weeks.  Patient wishes to come off Coumadin in the future and change to other form of anticoagulation to avoid INR checks.  This is reasonable.  Will discuss at next follow-up visit.

## 2015-11-12 NOTE — Progress Notes (Signed)
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 Anticoagulated on Coumadin as well   INTERVAL HISTORY: Ryan [name] 65 y.o. male returns for followup of Stage II cancer the right breast, grade 1, without LV I. ER +96%, PR 4%, Ki-67 marker low at 19%, HER-2/neu nonamplified. Oncotype score was 14. He was placed on tamoxifen 10 mg twice a day which he will take for 5 full years starting 03/08/2011. He is status post right mastectomy in April 2012 at which time he was found to have a 2.1 cm primary, 3 sentinel nodes were negative. AND Hodgkin's [phone]      Invasive ductal carcinoma of right breast, stage 2- 2012 s/p chemo and mastectomy   12/14/2010 Initial Diagnosis Right needle core biopsy at 9 o'clock position positive for invasive ductal carcinoma   01/17/2011 Surgery Right simple mastectomy showing a 2.1 cm invasive ductal carcinoma, clear marhins, 0/2 lymph nodes.  ER 96%, PR 84%, Ki-67 19%, Her2 negative.   03/08/2011 -  Chemotherapy Tamoxifen x 5 years   08/09/2015 Pathology Results BCI- Low risk of late recurrence years 5-10 (3.7%), low likelihood of benefit from extended endocrine therapy, low risk fo overall recurrence years 0-10 (6.6%), and low liklihood of benefit from extended endocrine therapy.    Hodgkin's [id number]   04/27/2011 Initial Diagnosis Hodgkin's  [phone]    I personally reviewed and went over laboratory results with the patient.  The results are noted within this dictation.  INR is good.  He is given direction on his anticoagulation.  He reports some orthostatic hypotension.  He is encouraged to increase PO H2O.  His BP today is solid.  He notes some neck stiffness.  He reports some left neck stiffness a few days ago.  This has resolved, but now his right neck is bothering him.  His exam is impressive for B/L trapezius muscle tightness in addition to right sternocleidomastoid tightness and tenderness. His ROM is decreased as a result.  He has started Flexeril.  I recommended chiropractic intervention if not better soon based upon personal experience in the past.  He is educated on the role of these two muscle groups in addition to muscle strain.    Unfortunately, last month with the snow storm, their roof collapsed on part of their house.  It is being fixed, but this has caused a tremendous amount of stress at home, rightfully so.    His LE rash is improved.    Past Medical History  Diagnosis Date  . Invasive ductal carcinoma of breast, stage 2 ([place]) 04/27/2011    chemo, mastectomy  . Right carotid artery occlusion 04/27/2011  . Left carotid artery partial occlusion 04/27/2011    CAROTIC DOPPLER 11/2013: < 40% R ICA stenosis, distal waveforms damped - suggests probably intracranial occlusoin, < 35% LICA, normal vertebrals -- no change  . Stroke [facility])     2007  . Hypercholesterolemia   . Adie's pupil   .  H/O Hodgkin's disease 74  . Thyroid disease   . S/P CABG x 3 04/09/2013    LIMA-LAD, fLRAD-RI, SVG-RCA; Echo 10/29/'15:  mild Conc LVH, no WMA, Gr 1 DD, Mod AoV Sclerosis w/ Mod AI, ~Mild-Mod MS   . CAD (coronary artery disease), native coronary artery 04/09/2013    Dist LM stenosis 70-80% (at trifurcation into LAD, CX, & 2 Ramus branches, 70% mid RCA);; b) Myoview 08/06/14: Low Risk, no Ischemia/infarction  . Essential  hypertension   . Mild mitral stenosis by prior echocardiogram 08/06/2014     Noted on echocardiogram  . Lymphoma ([place]) 1983    Aged 66  . Shortness of breath dyspnea   . Anemia 07/14/2013    has Invasive ductal carcinoma of right breast, stage 2- 2012 s/p chemo and mastectomy; Hodgkin's [id number]; Known RICA occlusion (CVA '07) partial LICA stenosis (25-42%) Jan 2014; Angina, class III - cath 04/07/13- 3V CAD; Hyperlipidemia with target LDL less than 70; Heart palpitations; Claudication of calf muscles (HCC); S/P CABG x 3  LIMA-LAD, free LRA to RI, SVG-RCA  - 04/09/13; Chronic anticoagulation- Coumadin; Obesity (BMI 30-39.9); CAD in native artery - status post CABG x3 (LIMA-LAD, Left Radial-Ramus Intermedius, SVG-RCA; Dizzy spells; Orthostatic hypotension; Anemia; Coronary atherosclerosis of native coronary artery - multivessel CAD, status post CABG x3 (LIMA-LAD, Left Radial-RI, SVG-RCA); Fatigue; Occlusion and stenosis of carotid artery without mention of cerebral infarction; Known RICA occlusion (CVA '07), partial LICA stenosis (70-62%); Exertional dyspnea; Essential hypertension; Cancer ([place]); 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     has No Known Allergies.  Current Outpatient Prescriptions on File Prior to Visit  Medication Sig Dispense Refill  . acetaminophen (TYLENOL) 500 MG tablet Take 500 mg by mouth every 6 (six) hours as needed for mild pain.     . Albuterol Sulfate (PROAIR RESPICLICK) 376 (90 BASE) MCG/ACT AEPB Inhale 1 puff into the lungs 4 (four) times daily as needed (shortness of breath.).    Marland Kitchen ALPRAZolam (XANAX) 1 MG tablet Take 0.5 mg by mouth at bedtime as needed for anxiety.     Marland Kitchen aspirin EC 81 MG tablet Take 81 mg by mouth daily.     . Calcium Carbonate-Vit D-Min (CALCIUM 1200 PO) Take 1 tablet by mouth daily.     . Cholecalciferol (VITAMIN D-3 PO) Take 1 tablet by mouth daily.     . citalopram  (CELEXA) 20 MG tablet Take 20 mg by mouth 2 (two) times daily.     . Coconut Oil 1000 MG CAPS Take 1,000 mg by mouth daily.    . diphenhydrAMINE (BENADRYL) 25 MG tablet Take 25 mg by mouth daily as needed for allergies.    . Garlic 283 MG CAPS Take 500 mg by mouth daily.    . Ginkgo Biloba 60 MG CAPS Take 1 capsule by mouth daily.    Marland Kitchen Hawthorne Berry 550 MG CAPS Take 550 mg by mouth at bedtime.    . iron polysaccharides (NIFEREX) 150 MG capsule Take 1 capsule (150 mg total) by mouth daily. 30 capsule 3  . levothyroxine (SYNTHROID, LEVOTHROID) 75 MCG tablet Take 75 mcg by mouth daily.     . nitroGLYCERIN (NITROSTAT) 0.4 MG SL tablet Place 1 tablet (0.4 mg total) under the tongue every 5 (five) minutes as needed for chest pain. 25 tablet 3  . Omega-3 Fatty Acids (FISH OIL) 1000 MG CPDR Take 1 each by mouth daily.    Marland Kitchen  simvastatin (ZOCOR) 40 MG tablet Take 40 mg by mouth at bedtime.     . tamoxifen (NOLVADEX) 20 MG tablet Take 1 tablet (20 mg total) by mouth daily. 30 tablet 5  . Umeclidinium-Vilanterol (ANORO ELLIPTA) 62.5-25 MCG/INH AEPB Inhale 1 puff into the lungs daily.    Marland Kitchen warfarin (COUMADIN) 4 MG tablet Take 1 tablet (4 mg total) by mouth daily. 30 tablet 6  . metoprolol tartrate (LOPRESSOR) 25 MG tablet TAKE ONE-HALF TABLET BY MOUTH ONCE DAILY AT BEDTIME 45 tablet 0  . warfarin (COUMADIN) 1 MG tablet Take as directed. 60 tablet 3   No current facility-administered medications on file prior to visit.    Past Surgical History  Procedure Laterality Date  . Mastectomy  rt  . Lymph node biopsy      left neck  . Coronary artery bypass graft [name] 04/08/2013    Procedure: CORONARY ARTERY BYPASS GRAFTING (CABG);  Surgeon: Ivin Poot, MD;  Location: [facility];  Service: Open Heart Surgery;  Laterality: [name];  Times 3 using left internal mammary artery; endoscopically harvested right saphenous vein and left radial artery  . Intraoperative transesophageal echocardiogram [name] 04/08/2013    Procedure:  INTRAOPERATIVE TRANSESOPHAGEAL ECHOCARDIOGRAM;  Surgeon: Ivin Poot, MD;  Location: [facility];  Service: Open Heart Surgery;  Laterality: [name];  . Radial artery harvest Left 04/08/2013    Procedure: RADIAL ARTERY HARVEST;  Surgeon: Ivin Poot, MD;  Location: [facility];  Service: Open Heart Surgery;  Laterality: Left;  . Nm treadmill myoview ltd  08/06/2014    Exercise 5:07 min, 6.3 METS; symptoms noted or extreme dyspnea and lightheadedness with some chest tightness. No EKG changes. No ischemia or infarction was normal EF.  . Transthoracic echocardiogram  08/06/2014     Normal EF 55-60%. Gr 1 DD - increased LVEDP. Moderate aortic valve thickening suggestive of AoV Sclerosis but not stenosis. Mild AI; MIld MS - mean gradient of 7 mmHg & HR of 122 bpm.  . Left heart catheterization with coronary angiogram [name] 04/07/2013    Procedure: LEFT HEART CATHETERIZATION WITH CORONARY ANGIOGRAM;  Surgeon: Leonie Man, MD;  Location: [facility] CATH LAB;  Service: Cardiovascular;  Laterality: [name];  . Colonoscopy [name] 05/06/2015    Procedure: COLONOSCOPY;  Surgeon: Rogene Houston, MD;  [facility];  Service: Endoscopy;  Laterality: [name];  930  . Cataract extraction w/phaco Right 06/21/2015    Procedure: CATARACT EXTRACTION PHACO AND INTRAOCULAR LENS PLACEMENT (IOC);  Surgeon: Tonny Branch, MD;  Location: [facility] OR[facility];  Service: Ophthalmology;  Laterality: Right;  CDE: 7.05  . Cataract extraction w/phaco Left 07/01/2015    Procedure: CATARACT EXTRACTION PHACO AND INTRAOCULAR LENS PLACEMENT (IOC);  Surgeon: Tonny Branch, MD;  Location: [facility];  Service: Ophthalmology;  Laterality: Left;  CDE: 7.49    Denies any headaches, dizziness, double vision, fevers, chills, nausea, vomiting, diarrhea, constipation, chest pain, heart palpitations, shortness of breath, blood in stool, black tarry stool, urinary pain, urinary burning, urinary frequency, hematuria.   PHYSICAL EXAMINATION  ECOG PERFORMANCE STATUS: 1 - Symptomatic but  completely ambulatory  Filed Vitals:   11/12/15 1053  BP: 135/64  Pulse: 83  Temp: 98 F (36.7 C)  Resp: 18    GENERAL:alert, no distress, well nourished, well developed, comfortable, cooperative, obese, smiling and accompanied by his wife. SKIN:   HEAD: Normocephalic, No masses, lesions, tenderness or abnormalities EYES: normal, PERRLA, EOMI, Conjunctiva are pink and non-injected EARS: External ears normal OROPHARYNX:lips, buccal mucosa, and tongue  normal and mucous membranes are moist  NECK: supple, no adenopathy, thyroid normal size, non-tender, without nodularity, trachea midline LYMPH:  no palpable lymphadenopathy BREAST:risk and benefit of breast self-exam was discussed LUNGS: clear to auscultation and percussion HEART: regular rate & rhythm, no murmurs, no gallops, S1 normal and S2 normal ABDOMEN:abdomen soft, non-tender, obese and normal bowel sounds BACK: Back symmetric, no curvature., No CVA tenderness.  Bilateral trapezius muscle tightness and right sternocleidomastoid muscle tenderness with tightness with decreased ROM of neck. EXTREMITIES:less then 2 second capillary refill, no joint deformities, effusion, or inflammation, no skin discoloration, no clubbing, no cyanosis  NEURO: alert & oriented x 3 with fluent speech, no focal motor/sensory deficits, gait normal   LABORATORY DATA: CBC    Component Value Date/Time   WBC 8.8 10/15/2015 0921   RBC 4.76 10/15/2015 0921   HGB 14.1 10/15/2015 0921   HCT 43.6 10/15/2015 0921   PLT 204 10/15/2015 0921   MCV 91.6 10/15/2015 0921   MCH 29.6 10/15/2015 0921   MCHC 32.3 10/15/2015 0921   RDW 13.4 10/15/2015 0921   LYMPHSABS 2.3 10/15/2015 0921   MONOABS 0.5 10/15/2015 0921   EOSABS 0.3 10/15/2015 0921   BASOSABS 0.1 10/15/2015 0921      Chemistry      Component Value Date/Time   NA 141 10/15/2015 0921   K 4.2 10/15/2015 0921   CL 106 10/15/2015 0921   CO2 26 10/15/2015 0921   BUN 12 10/15/2015 0921    CREATININE 1.31* 10/15/2015 0921   CREATININE 1.45* 07/14/2013 1639      Component Value Date/Time   CALCIUM 8.6* 10/15/2015 0921   ALKPHOS 41 10/15/2015 0921   AST 21 10/15/2015 0921   ALT 15* 10/15/2015 0921   BILITOT 0.4 10/15/2015 0921        PENDING LABS:   RADIOGRAPHIC STUDIES:  No results found.   PATHOLOGY:    ASSESSMENT AND PLAN:  Invasive ductal carcinoma of right breast, stage 2- 2012 s/p chemo and mastectomy Stage II cancer the right breast, grade 1, without LV I. ER +96%, PR 4%, Ki-67 marker low at 19%, HER-2/neu nonamplified. Oncotype score was 14. He was placed on tamoxifen 20 mg a day which he will take for 5 full years starting 03/08/2011. He is status post right mastectomy in April 2012 at which time he was found to have a 2.1 cm primary, 3 sentinel nodes were negative.  Oncology history is up-to-date.  Coming up on his 5 year anniversary of anti-endocrine therapy on May 2017.  BCI testing does not demonstrate any benefit to extended endocrine therapy.  Therefore, he will complete 5 years of Tamoxifen in May 2017.  He is very interested in changing his anticoagulation after completing 5 years of Tamoxifen.  We discussed some of the newer agents including direct Xa inhibitors that do not require lab testing to verify therapeutic dosing.  Return in 3 months for follow-up, sooner if needed.  This will mark that time of completion of anti-estrogen therapy.  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  Negative genetic testing on the remainder of the genes: ATM, BARD1, BRCA1, BRCA2, BRIP1, CDH1, EPCAM, FANCC, MLH1, MSH2, MSH6, NBN, PALB2, PMS2, PTEN, RAD51C, RAD51D, STK11, TP53, and XRCC2.  The report date is:  September 07, 2014.  Hodgkin's [phone] NED  Chronic anticoagulation- Coumadin Same dose of Coumadin.  INR in 4 weeks.  Patient wishes to come off Coumadin in the future and  change to  other form of anticoagulation to avoid INR checks.  This is reasonable.  Will discuss at next follow-up visit.  Anemia Normocytic, normochromic anemia.  Noted in October 2016.  None since.  Anemia work-up demonstrates a low-normal ferritin and B12.  Patient was started on oral Niferex.  Anti-parietal cell and intrinsic factor antibody testing completed on 08/30/2015, both negative.  He is S/P 4 weekly B12 injections finishing on 09/22/2015 with transition to PO B12 replacement.  HOWEVER, after starting both PO iron and PO B12, he developed a LE rash that is previously documented.  As a result, both medications were discontinued.    Oncology Flowsheet 08/30/2015 09/08/2015 09/15/2015  cyanocobalamin ((VITAMIN B-12)) IM 1,000 mcg 1,000 mcg 1,000 mcg   Oncology Flowsheet 09/22/2015  cyanocobalamin ((VITAMIN B-12)) IM 1,000 mcg    Labs in March 2017: ferritin, B12  Based upon March lab work, I will consider which medication to restart (iron or B12) depending on results.    THERAPY PLAN:  Continue surveillance as planned.  Continue Tamoxifen daily.  Sample will try to be sent for BCI testing.  In 1 month when he returns for his next INR, will work-up night sweats peripherally and an anemia panel.  If abnormal, will further investigate.  NCCN guidelines recommends the following surveillance for invasive breast cancer:  A. History and Physical exam every 4-6 months for 5 years and then every 12 months.  B. Mammography every 12 months  C. Women on Tamoxifen: annual gynecologic assessment every 12 months if uterus is present.  D. Women on aromatase inhibitor or who experience ovarian failure secondary to treatment should have monitoring of bone health with a bone mineral density determination at baseline and periodically thereafter.  E. Assess and encourage adherence to adjuvant endocrine therapy.  F. Evidence suggests that active lifestyle and achieving and maintaining an ideal body weight (20-25  BMI) may lead to optimal breast cancer outcomes.   All questions were answered. The patient knows to call the clinic with any problems, questions or concerns. We can certainly see the patient much sooner if necessary.  Patient and plan discussed with Dr. Ancil Linsey and she is in agreement with the aforementioned.   This note is electronically signed by: Ryan [name] 11/12/2015 6:21 PM

## 2015-11-12 NOTE — Assessment & Plan Note (Addendum)
Stage II cancer the right breast, grade 1, without LV I. ER +96%, PR 4%, Ki-67 marker low at 19%, HER-2/neu nonamplified. Oncotype score was 14. He was placed on tamoxifen 20 mg a day which he will take for 5 full years starting 03/08/2011. He is status post right mastectomy in April 2012 at which time he was found to have a 2.1 cm primary, 3 sentinel nodes were negative.  Oncology history is up-to-date.  Coming up on his 5 year anniversary of anti-endocrine therapy on May 2017.  BCI testing does not demonstrate any benefit to extended endocrine therapy.  Therefore, he will complete 5 years of Tamoxifen in May 2017.  He is very interested in changing his anticoagulation after completing 5 years of Tamoxifen.  We discussed some of the newer agents including direct Xa inhibitors that do not require lab testing to verify therapeutic dosing.  Return in 3 months for follow-up, sooner if needed.  This will mark that time of completion of anti-estrogen therapy.  He reports from right sided neck pain, after having left sided neck pain.  His wife started him on Flexeril.  I have recommended consider chiropractic intervention for muscle strain if not better soon.

## 2015-11-12 NOTE — Patient Instructions (Signed)
..  [place] at [facility] Discharge Instructions  RECOMMENDATIONS MADE BY THE CONSULTANT AND ANY TEST RESULTS WILL BE SENT TO YOUR REFERRING PHYSICIAN.  Continue tamoxifen INR today/cancel next week Continue flexeril  Labs wwith next INR in March Cbc, ferritin, b12 Return as schedule on 4/21 Hold iron and b12 for now   Thank you for choosing [facility] at [facility] to provide your oncology and hematology care.  To afford each patient quality time with our provider, please arrive at least 15 minutes before your scheduled appointment time.   Beginning January 23rd 2017 lab work for the [facility] will be done in the  Main lab at [facility] on 1st floor. If you have a lab appointment with the [facility] please come in thru the  Main Entrance and check in at the main information desk  You need to re-schedule your appointment should you arrive 10 or more minutes late.  We strive to give you quality time with our providers, and arriving late affects you and other patients whose appointments are after yours.  Also, if you no show three or more times for appointments you may be dismissed from the clinic at the providers discretion.     Again, thank you for choosing [facility].  Our hope is that these requests will decrease the amount of time that you wait before being seen by our physicians.       _____________________________________________________________  Should you have questions after your visit to [facility], please contact our office at [phone] between the hours of 8:30 a.m. and 4:30 p.m.  Voicemails left after 4:30 p.m. will not be returned until the following business day.  For prescription refill requests, have your pharmacy contact our office.

## 2015-11-19 ENCOUNTER — Other Ambulatory Visit (HOSPITAL_COMMUNITY): Payer: Medicare Other

## 2015-12-07 ENCOUNTER — Encounter: Payer: Self-pay | Admitting: Family

## 2015-12-10 ENCOUNTER — Encounter (HOSPITAL_COMMUNITY): Payer: Medicare Other | Attending: Internal Medicine

## 2015-12-10 DIAGNOSIS — C8190 Hodgkin lymphoma, unspecified, unspecified site: Secondary | ICD-10-CM | POA: Diagnosis not present

## 2015-12-10 DIAGNOSIS — E611 Iron deficiency: Secondary | ICD-10-CM

## 2015-12-10 DIAGNOSIS — Z803 Family history of malignant neoplasm of breast: Secondary | ICD-10-CM | POA: Insufficient documentation

## 2015-12-10 DIAGNOSIS — Z8041 Family history of malignant neoplasm of ovary: Secondary | ICD-10-CM | POA: Diagnosis not present

## 2015-12-10 DIAGNOSIS — C50929 Malignant neoplasm of unspecified site of unspecified male breast: Secondary | ICD-10-CM | POA: Diagnosis not present

## 2015-12-10 DIAGNOSIS — Z951 Presence of aortocoronary bypass graft: Secondary | ICD-10-CM | POA: Diagnosis not present

## 2015-12-10 DIAGNOSIS — E538 Deficiency of other specified B group vitamins: Secondary | ICD-10-CM

## 2015-12-10 DIAGNOSIS — D649 Anemia, unspecified: Secondary | ICD-10-CM

## 2015-12-10 DIAGNOSIS — Z7901 Long term (current) use of anticoagulants: Secondary | ICD-10-CM | POA: Diagnosis not present

## 2015-12-10 LAB — CBC WITH DIFFERENTIAL/PLATELET
BASOS ABS: 0.1 10*3/uL (ref 0.0–0.1)
Basophils Relative: 1 %
EOS ABS: 0.3 10*3/uL (ref 0.0–0.7)
EOS PCT: 4 %
HCT: 39.1 % (ref 39.0–52.0)
HEMOGLOBIN: 12.5 g/dL — AB (ref 13.0–17.0)
LYMPHS ABS: 2.5 10*3/uL (ref 0.7–4.0)
Lymphocytes Relative: 31 %
MCH: 29.6 pg (ref 26.0–34.0)
MCHC: 32 g/dL (ref 30.0–36.0)
MCV: 92.4 fL (ref 78.0–100.0)
Monocytes Absolute: 0.7 10*3/uL (ref 0.1–1.0)
Monocytes Relative: 8 %
NEUTROS PCT: 56 %
Neutro Abs: 4.4 10*3/uL (ref 1.7–7.7)
PLATELETS: 201 10*3/uL (ref 150–400)
RBC: 4.23 MIL/uL (ref 4.22–5.81)
RDW: 14 % (ref 11.5–15.5)
WBC: 7.9 10*3/uL (ref 4.0–10.5)

## 2015-12-10 LAB — VITAMIN B12: VITAMIN B 12: 334 pg/mL (ref 180–914)

## 2015-12-10 LAB — FERRITIN: FERRITIN: 91 ng/mL (ref 24–336)

## 2015-12-14 ENCOUNTER — Other Ambulatory Visit (HOSPITAL_COMMUNITY): Payer: Medicare Other

## 2015-12-15 ENCOUNTER — Ambulatory Visit (INDEPENDENT_AMBULATORY_CARE_PROVIDER_SITE_OTHER): Payer: Medicare Other | Admitting: Family

## 2015-12-15 ENCOUNTER — Encounter: Payer: Self-pay | Admitting: Family

## 2015-12-15 ENCOUNTER — Ambulatory Visit (HOSPITAL_COMMUNITY)
Admission: RE | Admit: 2015-12-15 | Discharge: 2015-12-15 | Disposition: A | Discharge status: Home / Self Care | Payer: Medicare Other | Source: Ambulatory Visit | Attending: Family | Admitting: Family

## 2015-12-15 VITALS — BP 116/74 | HR 87 | Ht 75.0 in | Wt 246.0 lb

## 2015-12-15 DIAGNOSIS — I6521 Occlusion and stenosis of right carotid artery: Secondary | ICD-10-CM | POA: Insufficient documentation

## 2015-12-15 DIAGNOSIS — Z8673 Personal history of transient ischemic attack (TIA), and cerebral infarction without residual deficits: Secondary | ICD-10-CM

## 2015-12-15 DIAGNOSIS — E785 Hyperlipidemia, unspecified: Secondary | ICD-10-CM | POA: Diagnosis not present

## 2015-12-15 DIAGNOSIS — I10 Essential (primary) hypertension: Secondary | ICD-10-CM | POA: Diagnosis not present

## 2015-12-15 DIAGNOSIS — I6522 Occlusion and stenosis of left carotid artery: Secondary | ICD-10-CM

## 2015-12-15 DIAGNOSIS — Z87891 Personal history of nicotine dependence: Secondary | ICD-10-CM

## 2015-12-15 NOTE — Addendum Note (Signed)
Addended by: Reola Calkins on: 12/15/2015 02:58 PM   Modules accepted: Orders

## 2015-12-15 NOTE — Progress Notes (Signed)
Chief Complaint: Extracranial Carotid Artery Stenosis   History of Present Illness  Ryan [name] is a 65 y.o. male patient of Dr. Scot Dock who has a known right internal carotid artery occlusion. He comes in for a follow up carotid duplex scan as we are following the left internal carotid artery closely.  His original right hemispheric stroke was in 2007 and was associated with left-sided weakness, no further stroke or TIA activity since then. He had radiation therapy in 1983 to his chest and neck for Hodgkin's Disease. He was diagnosed with right breast cancer in 2012, treated with surgical excision including lymph nodes.  He has undergone coronary revascularization.  Patient has not had previous carotid artery intervention.  The patient reports New Medical or Surgical History" "problem with my breathing"; pt states he saw his PCP re this; pt states he was told that radiation tx for his Hodgkin's Disease caused some degree of stenosis to his bhronchii.   Pt Diabetic: borderline Pt smoker: former smoker, quit in 1983  Pt meds include: Statin : Yes ASA: Yes Other anticoagulants/antiplatelets: coumadin is managed by the cancer center at [facility]    Past Medical History  Diagnosis Date  . Invasive ductal carcinoma of breast, stage 2 ([place]) 04/27/2011    chemo, mastectomy  . Right carotid artery occlusion 04/27/2011  . Left carotid artery partial occlusion 04/27/2011    CAROTIC DOPPLER 11/2013: < 40% R ICA stenosis, distal waveforms damped - suggests probably intracranial occlusoin, < [id number] LICA, normal vertebrals -- no change  . Stroke [facility])     2007  . Hypercholesterolemia   . Adie's pupil   . H/O Hodgkin's disease 49  . Thyroid disease   . S/P CABG x 3 04/09/2013    LIMA-LAD, fLRAD-RI, SVG-RCA; Echo 10/29/'15:  mild Conc LVH, no WMA, Gr 1 DD, Mod AoV Sclerosis w/ Mod AI, ~Mild-Mod MS   . CAD (coronary artery disease), native coronary artery 04/09/2013    Dist LM  stenosis 70-80% (at trifurcation into LAD, CX, & 2 Ramus branches, 70% mid RCA);; b) Myoview 08/06/14: Low Risk, no Ischemia/infarction  . Essential hypertension   . Mild mitral stenosis by prior echocardiogram 08/06/2014     Noted on echocardiogram  . Lymphoma ([place]) 1983    Aged 48  . Shortness of breath dyspnea   . Anemia 07/14/2013    Social History Social History  Substance Use Topics  . Smoking status: Former Smoker -- 1.00 packs/day    Types: Cigarettes    Quit date: 06/09/1982  . Smokeless tobacco: Never Used  . Alcohol Use: No    Family History Family History  Problem Relation Age of Onset  . Alzheimer's disease Mother   . Stroke Mother   . Hypertension Mother   . Hyperlipidemia Mother   . Diabetes Mother   . Hypertension Father   . Diabetes Paternal Grandmother   . Diabetes Paternal Grandfather   . Breast cancer Paternal Aunt   . Ovarian cancer Paternal Aunt     Surgical History Past Surgical History  Procedure Laterality Date  . Mastectomy  rt  . Lymph node biopsy      left neck  . Coronary artery bypass graft [name] 04/08/2013    Procedure: CORONARY ARTERY BYPASS GRAFTING (CABG);  Surgeon: Ivin Poot, MD;  Location: [place];  Service: Open Heart Surgery;  Laterality: [name];  Times 3 using left internal mammary artery; endoscopically harvested right saphenous vein and left radial artery  .  Intraoperative transesophageal echocardiogram [name] 04/08/2013    Procedure: INTRAOPERATIVE TRANSESOPHAGEAL ECHOCARDIOGRAM;  Surgeon: Ivin Poot, MD;  Location: [place];  Service: Open Heart Surgery;  Laterality: [name];  . Radial artery harvest Left 04/08/2013    Procedure: RADIAL ARTERY HARVEST;  Surgeon: Ivin Poot, MD;  Location: [place];  Service: Open Heart Surgery;  Laterality: Left;  . Nm treadmill myoview ltd  08/06/2014    Exercise 5:07 min, 6.3 METS; symptoms noted or extreme dyspnea and lightheadedness with some chest tightness. No EKG changes. No ischemia or infarction  was normal EF.  . Transthoracic echocardiogram  08/06/2014     Normal EF 55-60%. Gr 1 DD - increased LVEDP. Moderate aortic valve thickening suggestive of AoV Sclerosis but not stenosis. Mild AI; MIld MS - mean gradient of 7 mmHg & HR of 122 bpm.  . Left heart catheterization with coronary angiogram [name] 04/07/2013    Procedure: LEFT HEART CATHETERIZATION WITH CORONARY ANGIOGRAM;  Surgeon: Leonie Man, MD;  Location: [facility] CATH LAB;  Service: Cardiovascular;  Laterality: [name];  . Colonoscopy [name] 05/06/2015    Procedure: COLONOSCOPY;  Surgeon: Rogene Houston, MD;  [facility];  Service: Endoscopy;  Laterality: [name];  930  . Cataract extraction w/phaco Right 06/21/2015    Procedure: CATARACT EXTRACTION PHACO AND INTRAOCULAR LENS PLACEMENT (IOC);  Surgeon: Tonny Branch, MD;  Location: [facility];  Service: Ophthalmology;  Laterality: Right;  CDE: 7.05  . Cataract extraction w/phaco Left 07/01/2015    Procedure: CATARACT EXTRACTION PHACO AND INTRAOCULAR LENS PLACEMENT (IOC);  Surgeon: Tonny Branch, MD;  Location: [facility];  Service: Ophthalmology;  Laterality: Left;  CDE: 7.49    No Known Allergies  Current Outpatient Prescriptions  Medication Sig Dispense Refill  . acetaminophen (TYLENOL) 500 MG tablet Take 500 mg by mouth every 6 (six) hours as needed for mild pain.     . Albuterol Sulfate (PROAIR RESPICLICK) 123XX123 (90 BASE) MCG/ACT AEPB Inhale 1 puff into the lungs 4 (four) times daily as needed (shortness of breath.).    Marland Kitchen ALPRAZolam (XANAX) 1 MG tablet Take 0.5 mg by mouth at bedtime as needed for anxiety.     Marland Kitchen aspirin EC 81 MG tablet Take 81 mg by mouth daily.     . Calcium Carbonate-Vit D-Min (CALCIUM 1200 PO) Take 1 tablet by mouth daily.     . Cholecalciferol (VITAMIN D-3 PO) Take 1 tablet by mouth daily.     . citalopram (CELEXA) 20 MG tablet Take 20 mg by mouth 2 (two) times daily.     . Coconut Oil 1000 MG CAPS Take 1,000 mg by mouth daily.    . cyclobenzaprine (FLEXERIL) 10 MG tablet  Take 10 mg by mouth 3 (three) times daily as needed for muscle spasms.    . diphenhydrAMINE (BENADRYL) 25 MG tablet Take 25 mg by mouth daily as needed for allergies.    . Garlic [id number] MG CAPS Take 500 mg by mouth daily.    . Ginkgo Biloba 60 MG CAPS Take 1 capsule by mouth daily.    Marland Kitchen Hawthorne Berry 550 MG CAPS Take 550 mg by mouth at bedtime.    Marland Kitchen levothyroxine (SYNTHROID, LEVOTHROID) 75 MCG tablet Take 75 mcg by mouth daily.     . metoprolol tartrate (LOPRESSOR) 25 MG tablet TAKE ONE-HALF TABLET BY MOUTH ONCE DAILY AT BEDTIME 45 tablet 0  . nitroGLYCERIN (NITROSTAT) 0.4 MG SL tablet Place 1 tablet (0.4 mg total) under the tongue every 5 (five)  minutes as needed for chest pain. 25 tablet 3  . Omega-3 Fatty Acids (FISH OIL) 1000 MG CPDR Take 1 each by mouth daily.    Marland Kitchen oxycodone (OXY-IR) 5 MG capsule Take 5 mg by mouth every 4 (four) hours as needed.    . simvastatin (ZOCOR) 40 MG tablet Take 40 mg by mouth at bedtime.     . tamoxifen (NOLVADEX) 20 MG tablet Take 1 tablet (20 mg total) by mouth daily. 30 tablet 5  . Umeclidinium-Vilanterol (ANORO ELLIPTA) 62.5-25 MCG/INH AEPB Inhale 1 puff into the lungs daily.    Marland Kitchen warfarin (COUMADIN) 1 MG tablet Take as directed. 60 tablet 3  . warfarin (COUMADIN) 4 MG tablet Take 1 tablet (4 mg total) by mouth daily. 30 tablet 6  . iron polysaccharides (NIFEREX) 150 MG capsule Take 1 capsule (150 mg total) by mouth daily. (Patient not taking: Reported on 12/15/2015) 30 capsule 3   No current facility-administered medications for this visit.    Review of Systems : See HPI for pertinent positives and negatives.  Physical Examination  Filed Vitals:   12/15/15 1304 12/15/15 1305  BP: 114/69 116/74  Pulse: 87   Height: 6\' 3"  (1.905 m)   Weight: 246 lb (111.585 kg)   SpO2: 96%    Body mass index is 30.75 kg/(m^2).  General: WDWN male in NAD GAIT: normal Eyes: Left pupil remains moderately dilated and non eactive to light (Adie's pupil, pt states he had  this before his stroke) Pulmonary: Non-labored  Cardiac: regular rhythm,no detected murmur.  VASCULAR EXAM Carotid Bruits Right Left   Negative Negative    Radial pulses: 3+ palpable right, left is 1+ palpable, left brachial is 2+ palpable.       LE Pulses Right Left   POPLITEAL not palpable  not palpable   POSTERIOR TIBIAL  palpable   palpable    DORSALIS PEDIS  ANTERIOR TIBIAL palpable  palpable     Gastrointestinal: soft, nontender, BS WNL, no r/g, small reducible umbilical hernia.  Musculoskeletal: No muscle atrophy/wasting. M/S 5/5 throughout, Extremities without ischemic changes. Since he had a [employer] bite in 2001 on his left calf, this area has been slightly darkly discolored.   Neurologic: A&O X 3; Appropriate Affect, Speech is normal and loquacious.  CN 2-12 intact, Pain and light touch intact in extremities, Motor exam as listed above.           Non-Invasive Vascular Imaging CAROTID DUPLEX 12/15/2015   Right ICA: remains occluded  Left ICA: <40% stenosis. No significant change compared to exam on 12/10/14   Assessment: DAYRON DIETERT is a 65 y.o. male who has a known right extracranial internal carotid artery occlusion. He comes in for a follow up carotid duplex scan as we are following the left ICA closely.  His original right hemispheric stroke was in 2007 and was associated with left-sided weakness, no further stroke or TIA activity since then. Today's carotid duplex confirms right ICA occlusion and suggests <40% left ICA stenosis.  No significant change compared to exam on 12/10/14.   Plan: Follow-up in 1 year with left Carotid Duplex scan, right ICA remains occluded and is no longer stroke potential.  I discussed in depth with the patient the nature of  atherosclerosis, and emphasized the importance of maximal medical management including strict control of blood pressure, blood glucose, and lipid levels, obtaining regular exercise, and continued cessation of smoking.  The patient is aware that without maximal medical management the  underlying atherosclerotic disease process will progress, limiting the benefit of any interventions. The patient was given information about stroke prevention and what symptoms should prompt the patient to seek immediate medical care. Thank you for allowing [place] to participate in this patient's care.  Clemon Chambers, RN, MSN, FNP-C [facility] Office: [phone]  Clinic Physician: Scot Dock  12/15/2015 1:07 PM

## 2015-12-15 NOTE — Patient Instructions (Signed)
Stroke Prevention Some medical conditions and behaviors are associated with an increased chance of having a stroke. You may prevent a stroke by making healthy choices and managing medical conditions. HOW CAN I REDUCE MY RISK OF HAVING A STROKE?   Stay physically active. Get at least 30 minutes of activity on most or all days.  Do not smoke. It may also be helpful to avoid exposure to secondhand smoke.  Limit alcohol use. Moderate alcohol use is considered to be:  No more than 2 drinks per day for men.  No more than 1 drink per day for nonpregnant women.  Eat healthy foods. This involves:  Eating 5 or more servings of fruits and vegetables a day.  Making dietary changes that address high blood pressure (hypertension), high cholesterol, diabetes, or obesity.  Manage your cholesterol levels.  Making food choices that are high in fiber and low in saturated fat, trans fat, and cholesterol may control cholesterol levels.  Take any prescribed medicines to control cholesterol as directed by your health care provider.  Manage your diabetes.  Controlling your carbohydrate and sugar intake is recommended to manage diabetes.  Take any prescribed medicines to control diabetes as directed by your health care provider.  Control your hypertension.  Making food choices that are low in salt (sodium), saturated fat, trans fat, and cholesterol is recommended to manage hypertension.  Ask your health care provider if you need treatment to lower your blood pressure. Take any prescribed medicines to control hypertension as directed by your health care provider.  If you are 18-39 years of age, have your blood pressure checked every 3-5 years. If you are 40 years of age or older, have your blood pressure checked every year.  Maintain a healthy weight.  Reducing calorie intake and making food choices that are low in sodium, saturated fat, trans fat, and cholesterol are recommended to manage  weight.  Stop drug abuse.  Avoid taking birth control pills.  Talk to your health care provider about the risks of taking birth control pills if you are over 35 years old, smoke, get migraines, or have ever had a blood clot.  Get evaluated for sleep disorders (sleep apnea).  Talk to your health care provider about getting a sleep evaluation if you snore a lot or have excessive sleepiness.  Take medicines only as directed by your health care provider.  For some people, aspirin or blood thinners (anticoagulants) are helpful in reducing the risk of forming abnormal blood clots that can lead to stroke. If you have the irregular heart rhythm of atrial fibrillation, you should be on a blood thinner unless there is a good reason you cannot take them.  Understand all your medicine instructions.  Make sure that other conditions (such as anemia or atherosclerosis) are addressed. SEEK IMMEDIATE MEDICAL CARE IF:   You have sudden weakness or numbness of the face, arm, or leg, especially on one side of the body.  Your face or eyelid droops to one side.  You have sudden confusion.  You have trouble speaking (aphasia) or understanding.  You have sudden trouble seeing in one or both eyes.  You have sudden trouble walking.  You have dizziness.  You have a loss of balance or coordination.  You have a sudden, severe headache with no known cause.  You have new chest pain or an irregular heartbeat. Any of these symptoms may represent a serious problem that is an emergency. Do not wait to see if the symptoms will   go away. Get medical help at once. Call your local emergency services (911 in [place] Do not drive yourself to the hospital.   This information is not intended to replace advice given to you by your health care provider. Make sure you discuss any questions you have with your health care provider.   Document Released: 11/02/2004 Document Revised: 10/16/2014 Document Reviewed:  03/28/2013 Elsevier Interactive Patient Education 2016 Elsevier Inc.  

## 2015-12-16 NOTE — Addendum Note (Signed)
Encounter addended by: Leeroy Cha, PT on: 12/16/2015  3:50 PM<BR>     Documentation filed: Episodes, Clinical Notes, Flowsheet VN

## 2015-12-22 ENCOUNTER — Inpatient Hospital Stay (HOSPITAL_COMMUNITY)
Admission: EM | Admit: 2015-12-22 | Discharge: 2015-12-24 | DRG: 340 | Disposition: A | Discharge status: Home / Self Care | Payer: Medicare Other | Attending: General Surgery | Admitting: General Surgery

## 2015-12-22 ENCOUNTER — Encounter (HOSPITAL_COMMUNITY): Payer: Self-pay | Admitting: *Deleted

## 2015-12-22 DIAGNOSIS — K3580 Unspecified acute appendicitis: Secondary | ICD-10-CM | POA: Diagnosis not present

## 2015-12-22 DIAGNOSIS — Z7982 Long term (current) use of aspirin: Secondary | ICD-10-CM | POA: Diagnosis not present

## 2015-12-22 DIAGNOSIS — Z8571 Personal history of Hodgkin lymphoma: Secondary | ICD-10-CM

## 2015-12-22 DIAGNOSIS — Z853 Personal history of malignant neoplasm of breast: Secondary | ICD-10-CM

## 2015-12-22 DIAGNOSIS — I2510 Atherosclerotic heart disease of native coronary artery without angina pectoris: Secondary | ICD-10-CM | POA: Diagnosis present

## 2015-12-22 DIAGNOSIS — Z8673 Personal history of transient ischemic attack (TIA), and cerebral infarction without residual deficits: Secondary | ICD-10-CM

## 2015-12-22 DIAGNOSIS — Z125 Encounter for screening for malignant neoplasm of prostate: Secondary | ICD-10-CM | POA: Diagnosis not present

## 2015-12-22 DIAGNOSIS — Z7901 Long term (current) use of anticoagulants: Secondary | ICD-10-CM

## 2015-12-22 DIAGNOSIS — C8190 Hodgkin lymphoma, unspecified, unspecified site: Secondary | ICD-10-CM | POA: Diagnosis not present

## 2015-12-22 DIAGNOSIS — Z823 Family history of stroke: Secondary | ICD-10-CM | POA: Diagnosis not present

## 2015-12-22 DIAGNOSIS — Z803 Family history of malignant neoplasm of breast: Secondary | ICD-10-CM

## 2015-12-22 DIAGNOSIS — E7800 Pure hypercholesterolemia, unspecified: Secondary | ICD-10-CM | POA: Diagnosis present

## 2015-12-22 DIAGNOSIS — Z79899 Other long term (current) drug therapy: Secondary | ICD-10-CM | POA: Diagnosis not present

## 2015-12-22 DIAGNOSIS — E039 Hypothyroidism, unspecified: Secondary | ICD-10-CM | POA: Diagnosis present

## 2015-12-22 DIAGNOSIS — Z951 Presence of aortocoronary bypass graft: Secondary | ICD-10-CM

## 2015-12-22 DIAGNOSIS — Z6830 Body mass index (BMI) 30.0-30.9, adult: Secondary | ICD-10-CM | POA: Diagnosis not present

## 2015-12-22 DIAGNOSIS — I10 Essential (primary) hypertension: Secondary | ICD-10-CM | POA: Diagnosis present

## 2015-12-22 DIAGNOSIS — E063 Autoimmune thyroiditis: Secondary | ICD-10-CM | POA: Diagnosis not present

## 2015-12-22 DIAGNOSIS — K352 Acute appendicitis with generalized peritonitis: Principal | ICD-10-CM | POA: Diagnosis present

## 2015-12-22 DIAGNOSIS — I739 Peripheral vascular disease, unspecified: Secondary | ICD-10-CM | POA: Diagnosis present

## 2015-12-22 DIAGNOSIS — Z87891 Personal history of nicotine dependence: Secondary | ICD-10-CM | POA: Diagnosis not present

## 2015-12-22 DIAGNOSIS — N183 Chronic kidney disease, stage 3 (moderate): Secondary | ICD-10-CM | POA: Diagnosis not present

## 2015-12-22 DIAGNOSIS — R1031 Right lower quadrant pain: Secondary | ICD-10-CM | POA: Diagnosis not present

## 2015-12-22 DIAGNOSIS — E6609 Other obesity due to excess calories: Secondary | ICD-10-CM | POA: Diagnosis not present

## 2015-12-22 DIAGNOSIS — Z1389 Encounter for screening for other disorder: Secondary | ICD-10-CM | POA: Diagnosis not present

## 2015-12-22 HISTORY — DX: Shortness of breath: R06.02

## 2015-12-22 HISTORY — DX: Hypothyroidism, unspecified: E03.9

## 2015-12-22 HISTORY — DX: Major depressive disorder, single episode, unspecified: F32.9

## 2015-12-22 HISTORY — DX: Restless legs syndrome: G25.81

## 2015-12-22 HISTORY — DX: Hodgkin lymphoma, unspecified, unspecified site: C81.90

## 2015-12-22 HISTORY — DX: Prediabetes: R73.03

## 2015-12-22 LAB — CBC
HEMATOCRIT: 40.6 % (ref 39.0–52.0)
HEMOGLOBIN: 12.8 g/dL — AB (ref 13.0–17.0)
MCH: 29 pg (ref 26.0–34.0)
MCHC: 31.5 g/dL (ref 30.0–36.0)
MCV: 92.1 fL (ref 78.0–100.0)
Platelets: 209 10*3/uL (ref 150–400)
RBC: 4.41 MIL/uL (ref 4.22–5.81)
RDW: 14 % (ref 11.5–15.5)
WBC: 12 10*3/uL — ABNORMAL HIGH (ref 4.0–10.5)

## 2015-12-22 LAB — COMPREHENSIVE METABOLIC PANEL
ALBUMIN: 3.5 g/dL (ref 3.5–5.0)
ALT: 13 U/L — ABNORMAL LOW (ref 17–63)
ANION GAP: 12 (ref 5–15)
AST: 23 U/L (ref 15–41)
Alkaline Phosphatase: 37 U/L — ABNORMAL LOW (ref 38–126)
BUN: 14 mg/dL (ref 6–20)
CO2: 25 mmol/L (ref 22–32)
Calcium: 8.8 mg/dL — ABNORMAL LOW (ref 8.9–10.3)
Chloride: 104 mmol/L (ref 101–111)
Creatinine, Ser: 1.61 mg/dL — ABNORMAL HIGH (ref 0.61–1.24)
GFR calc Af Amer: 50 mL/min — ABNORMAL LOW (ref >60)
GFR calc non Af Amer: 43 mL/min — ABNORMAL LOW (ref >60)
GLUCOSE: 119 mg/dL — AB (ref 65–99)
POTASSIUM: 4 mmol/L (ref 3.5–5.1)
SODIUM: 141 mmol/L (ref 135–145)
Total Bilirubin: 0.9 mg/dL (ref 0.3–1.2)
Total Protein: 6.1 g/dL — ABNORMAL LOW (ref 6.5–8.1)

## 2015-12-22 LAB — URINALYSIS, ROUTINE W REFLEX MICROSCOPIC
BILIRUBIN URINE: NEGATIVE
Glucose, UA: NEGATIVE mg/dL
HGB URINE DIPSTICK: NEGATIVE
Ketones, ur: 15 mg/dL — AB
Leukocytes, UA: NEGATIVE
NITRITE: NEGATIVE
PH: 5 (ref 5.0–8.0)
Protein, ur: NEGATIVE mg/dL
SPECIFIC GRAVITY, URINE: 1.015 (ref 1.005–1.030)

## 2015-12-22 LAB — PROTIME-INR
INR: 2.44 — AB (ref 0.00–1.49)
INR: 2.36 — ABNORMAL HIGH (ref 0.00–1.49)
PROTHROMBIN TIME: 25.5 s — AB (ref 11.6–15.2)
Prothrombin Time: 26.2 seconds — ABNORMAL HIGH (ref 11.6–15.2)

## 2015-12-22 LAB — LIPASE, BLOOD: LIPASE: 26 U/L (ref 11–51)

## 2015-12-22 MED ORDER — ALBUTEROL SULFATE (2.5 MG/3ML) 0.083% IN NEBU: 2.5000 mg | INHALATION_SOLUTION | Freq: Four times a day (QID) | RESPIRATORY_TRACT | Status: DC | PRN

## 2015-12-22 MED ORDER — DIPHENHYDRAMINE HCL 12.5 MG/5ML PO ELIX: 12.5000 mg | ORAL_SOLUTION | Freq: Four times a day (QID) | ORAL | Status: DC | PRN

## 2015-12-22 MED ORDER — DEXTROSE-NACL 5-0.45 % IV SOLN
INTRAVENOUS | Status: DC
  Administered 2015-12-22 – 2015-12-23 (×3): via INTRAVENOUS

## 2015-12-22 MED ORDER — ALPRAZOLAM 0.5 MG PO TABS: 0.5000 mg | ORAL_TABLET | Freq: Every evening | ORAL | Status: DC | PRN

## 2015-12-22 MED ORDER — NITROGLYCERIN 0.4 MG SL SUBL: 0.4000 mg | SUBLINGUAL_TABLET | SUBLINGUAL | Status: DC | PRN

## 2015-12-22 MED ORDER — ONDANSETRON 4 MG PO TBDP: 4.0000 mg | ORAL_TABLET | Freq: Four times a day (QID) | ORAL | Status: DC | PRN

## 2015-12-22 MED ORDER — DEXTROSE 5 % IV SOLN
2.0000 g | INTRAVENOUS | Status: DC
  Filled 2015-12-22: qty 2

## 2015-12-22 MED ORDER — DIPHENHYDRAMINE HCL 50 MG/ML IJ SOLN: 12.5000 mg | Freq: Four times a day (QID) | INTRAMUSCULAR | Status: DC | PRN

## 2015-12-22 MED ORDER — VITAMIN K1 10 MG/ML IJ SOLN
5.0000 mg | Freq: Once | INTRAVENOUS | Status: AC
  Administered 2015-12-22: 5 mg via INTRAVENOUS
  Filled 2015-12-22: qty 0.5

## 2015-12-22 MED ORDER — MORPHINE SULFATE (PF) 2 MG/ML IV SOLN: 1.0000 mg | INTRAVENOUS | Status: DC | PRN

## 2015-12-22 MED ORDER — ONDANSETRON HCL 4 MG/2ML IJ SOLN: 4.0000 mg | Freq: Four times a day (QID) | INTRAMUSCULAR | Status: DC | PRN

## 2015-12-22 MED ORDER — METRONIDAZOLE IN NACL 5-0.79 MG/ML-% IV SOLN
500.0000 mg | Freq: Three times a day (TID) | INTRAVENOUS | Status: DC
  Administered 2015-12-23 – 2015-12-24 (×4): 500 mg via INTRAVENOUS
  Filled 2015-12-22 (×7): qty 100

## 2015-12-22 MED ORDER — PANTOPRAZOLE SODIUM 40 MG IV SOLR
40.0000 mg | Freq: Every day | INTRAVENOUS | Status: DC
  Administered 2015-12-22 – 2015-12-23 (×2): 40 mg via INTRAVENOUS
  Filled 2015-12-22 (×2): qty 40

## 2015-12-22 MED ORDER — METRONIDAZOLE IN NACL 5-0.79 MG/ML-% IV SOLN
500.0000 mg | Freq: Three times a day (TID) | INTRAVENOUS | Status: DC
  Filled 2015-12-22 (×2): qty 100

## 2015-12-22 MED ORDER — CITALOPRAM HYDROBROMIDE 20 MG PO TABS
40.0000 mg | ORAL_TABLET | Freq: Every evening | ORAL | Status: DC
  Administered 2015-12-23: 40 mg via ORAL
  Filled 2015-12-22: qty 2

## 2015-12-22 MED ORDER — LEVOTHYROXINE SODIUM 75 MCG PO TABS
75.0000 ug | ORAL_TABLET | Freq: Every day | ORAL | Status: DC
  Administered 2015-12-24: 75 ug via ORAL
  Filled 2015-12-22: qty 1

## 2015-12-22 MED ORDER — METRONIDAZOLE IN NACL 5-0.79 MG/ML-% IV SOLN
500.0000 mg | Freq: Once | INTRAVENOUS | Status: AC
  Administered 2015-12-22: 500 mg via INTRAVENOUS
  Filled 2015-12-22: qty 100

## 2015-12-22 MED ORDER — DEXTROSE 5 % IV SOLN
2.0000 g | INTRAVENOUS | Status: DC
  Administered 2015-12-23: 2 g via INTRAVENOUS
  Filled 2015-12-22 (×2): qty 2

## 2015-12-22 MED ORDER — METOPROLOL TARTRATE 25 MG PO TABS
37.5000 mg | ORAL_TABLET | Freq: Every day | ORAL | Status: DC
  Administered 2015-12-22 – 2015-12-23 (×2): 37.5 mg via ORAL
  Filled 2015-12-22 (×2): qty 1

## 2015-12-22 MED ORDER — SIMVASTATIN 40 MG PO TABS
40.0000 mg | ORAL_TABLET | Freq: Every day | ORAL | Status: DC
  Administered 2015-12-22 – 2015-12-23 (×2): 40 mg via ORAL
  Filled 2015-12-22 (×2): qty 1

## 2015-12-22 MED ORDER — DEXTROSE 5 % IV SOLN
1.0000 g | Freq: Once | INTRAVENOUS | Status: AC
  Administered 2015-12-22: 1 g via INTRAVENOUS
  Filled 2015-12-22: qty 10

## 2015-12-22 NOTE — H&P (Signed)
Ryan [name] is an 65 y.o. male.   Chief Complaint: Abdominal pain with CT showing acute appendicitis from PCP office HPI: Started on Monday, worsened on Tuesday, thought to be related to exertion with moving object.  Did not improve.  CT scan of the abd/pelvis done at an outside facility.    Past Medical History  Diagnosis Date  . Invasive ductal carcinoma of breast, stage 2 ([place]) 04/27/2011    chemo, mastectomy  . Right carotid artery occlusion 04/27/2011  . Left carotid artery partial occlusion 04/27/2011    CAROTIC DOPPLER 11/2013: < 40% R ICA stenosis, distal waveforms damped - suggests probably intracranial occlusoin, < 65% LICA, normal vertebrals -- no change  . Stroke [facility])     2007  . Hypercholesterolemia   . Adie's pupil   . H/O Hodgkin's disease 77  . Thyroid disease   . S/P CABG x 3 04/09/2013    LIMA-LAD, fLRAD-RI, SVG-RCA; Echo 10/29/'15:  mild Conc LVH, no WMA, Gr 1 DD, Mod AoV Sclerosis w/ Mod AI, ~Mild-Mod MS   . CAD (coronary artery disease), native coronary artery 04/09/2013    Dist LM stenosis 70-80% (at trifurcation into LAD, CX, & 2 Ramus branches, 70% mid RCA);; b) Myoview 08/06/14: Low Risk, no Ischemia/infarction  . Essential hypertension   . Mild mitral stenosis by prior echocardiogram 08/06/2014     Noted on echocardiogram  . Lymphoma ([place]) 1983    Aged 10  . Shortness of breath dyspnea   . Anemia 07/14/2013    Past Surgical History  Procedure Laterality Date  . Mastectomy  rt  . Lymph node biopsy      left neck  . Coronary artery bypass graft [name] 04/08/2013    Procedure: CORONARY ARTERY BYPASS GRAFTING (CABG);  Surgeon: Ivin Poot, MD;  Location: [place];  Service: Open Heart Surgery;  Laterality: [name];  Times 3 using left internal mammary artery; endoscopically harvested right saphenous vein and left radial artery  . Intraoperative transesophageal echocardiogram [name] 04/08/2013    Procedure: INTRAOPERATIVE TRANSESOPHAGEAL ECHOCARDIOGRAM;  Surgeon: Ivin Poot, MD;  Location: [place];  Service: Open Heart Surgery;  Laterality: [name];  . Radial artery harvest Left 04/08/2013    Procedure: RADIAL ARTERY HARVEST;  Surgeon: Ivin Poot, MD;  Location: [place];  Service: Open Heart Surgery;  Laterality: Left;  . Nm treadmill myoview ltd  08/06/2014    Exercise 5:07 min, 6.3 METS; symptoms noted or extreme dyspnea and lightheadedness with some chest tightness. No EKG changes. No ischemia or infarction was normal EF.  . Transthoracic echocardiogram  08/06/2014     Normal EF 55-60%. Gr 1 DD - increased LVEDP. Moderate aortic valve thickening suggestive of AoV Sclerosis but not stenosis. Mild AI; MIld MS - mean gradient of 7 mmHg & HR of 122 bpm.  . Left heart catheterization with coronary angiogram [name] 04/07/2013    Procedure: LEFT HEART CATHETERIZATION WITH CORONARY ANGIOGRAM;  Surgeon: Leonie Man, MD;  Location: [facility] CATH LAB;  Service: Cardiovascular;  Laterality: [name];  . Colonoscopy [name] 05/06/2015    Procedure: COLONOSCOPY;  Surgeon: Rogene Houston, MD;  [facility];  Service: Endoscopy;  Laterality: [name];  930  . Cataract extraction w/phaco Right 06/21/2015    Procedure: CATARACT EXTRACTION PHACO AND INTRAOCULAR LENS PLACEMENT (IOC);  Surgeon: Tonny Branch, MD;  Location: [facility];  Service: Ophthalmology;  Laterality: Right;  CDE: 7.05  . Cataract extraction w/phaco Left 07/01/2015    Procedure:  CATARACT EXTRACTION PHACO AND INTRAOCULAR LENS PLACEMENT (IOC);  Surgeon: Tonny Branch, MD;  Location: [facility];  Service: Ophthalmology;  Laterality: Left;  CDE: 7.49    Family History  Problem Relation Age of Onset  . Alzheimer's disease Mother   . Stroke Mother   . Hypertension Mother   . Hyperlipidemia Mother   . Diabetes Mother   . Hypertension Father   . Diabetes Paternal Grandmother   . Diabetes Paternal Grandfather   . Breast cancer Paternal Aunt   . Ovarian cancer Paternal Aunt    Social History:  reports that he quit smoking about 33  years ago. His smoking use included Cigarettes. He smoked 1.00 pack per day. He has never used smokeless tobacco. He reports that he does not drink alcohol or use illicit drugs.  Allergies: No Known Allergies   (Not in a hospital admission)  Results for orders placed or performed during the hospital encounter of 12/22/15 (from the past 48 hour(s))  Lipase, blood     Status: None   Collection Time: 12/22/15  4:45 PM  Result Value Ref Range   Lipase 26 11 - 51 U/L  Comprehensive metabolic panel     Status: Abnormal   Collection Time: 12/22/15  4:45 PM  Result Value Ref Range   Sodium 141 135 - 145 mmol/L   Potassium 4.0 3.5 - 5.1 mmol/L   Chloride 104 101 - 111 mmol/L   CO2 25 22 - 32 mmol/L   Glucose, Bld 119 (H) 65 - 99 mg/dL   BUN 14 6 - 20 mg/dL   Creatinine, Ser 1.61 (H) 0.61 - 1.24 mg/dL   Calcium 8.8 (L) 8.9 - 10.3 mg/dL   Total Protein 6.1 (L) 6.5 - 8.1 g/dL   Albumin 3.5 3.5 - 5.0 g/dL   AST 23 15 - 41 U/L   ALT 13 (L) 17 - 63 U/L   Alkaline Phosphatase 37 (L) 38 - 126 U/L   Total Bilirubin 0.9 0.3 - 1.2 mg/dL   GFR calc non Af Amer 43 (L) >60 mL/min   GFR calc Af Amer 50 (L) >60 mL/min    Comment: (NOTE) The eGFR has been calculated using the CKD EPI equation. This calculation has not been validated in all clinical situations. eGFR's persistently <60 mL/min signify possible Chronic Kidney Disease.    Anion gap 12 5 - 15  CBC     Status: Abnormal   Collection Time: 12/22/15  4:45 PM  Result Value Ref Range   WBC 12.0 (H) 4.0 - 10.5 K/uL   RBC 4.41 4.22 - 5.81 MIL/uL   Hemoglobin 12.8 (L) 13.0 - 17.0 g/dL   HCT 40.6 39.0 - 52.0 %   MCV 92.1 78.0 - 100.0 fL   MCH 29.0 26.0 - 34.0 pg   MCHC 31.5 30.0 - 36.0 g/dL   RDW 14.0 11.5 - 15.5 %   Platelets 209 150 - 400 K/uL   No results found.  Review of Systems  Constitutional: Negative for fever and chills.  Gastrointestinal: Positive for nausea, vomiting and abdominal pain.  All other systems reviewed and are  negative.   Blood pressure 136/69, pulse 89, temperature 97.4 F (36.3 C), temperature source Oral, resp. rate 19, SpO2 96 %. Physical Exam  Vitals reviewed. Constitutional: He is oriented to person, place, and time. He appears well-developed and well-nourished.  HENT:  Head: Normocephalic and atraumatic.  Eyes: Conjunctivae and EOM are normal. Pupils are equal, round, and reactive to light.  Neck: Normal range of motion. Neck supple.  Cardiovascular: Normal rate, normal heart sounds and intact distal pulses.   Respiratory: Effort normal and breath sounds normal.  GI: Soft. Normal appearance and bowel sounds are normal. There is tenderness in the right lower quadrant. There is rebound. There is no rigidity and no guarding. A hernia (umbilical) is present.  Musculoskeletal: Normal range of motion.  Neurological: He is alert and oriented to person, place, and time.  Skin: Skin is warm and dry.  Psychiatric: He has a normal mood and affect. His behavior is normal. Judgment and thought content normal.     Assessment/Plan Acute appendicitis with possible appendolithiasis.  No evidence of perforation. Patient is on coumadin, but no current INR on file.  Will check now.   Will place the patient on IV antibiotics and reverse INR if elevated. Will start Rocephin and Flagyl. IV hydration. Pain control  Judeth Horn, MD 12/22/2015, 6:31 PM

## 2015-12-22 NOTE — ED Notes (Signed)
Pt reports RLQ x 2 days, tender to touch. Having nausea, fever, diarrhea. no vomiting. Pt went for CT scan and sent here due to +appendicitis.

## 2015-12-22 NOTE — ED Provider Notes (Signed)
CSN: [id number]     Arrival date & time 12/22/15  1636 History   First MD Initiated Contact with Patient 12/22/15 1726     Chief Complaint  Patient presents with  . Abdominal Pain     (Consider location/radiation/quality/duration/timing/severity/associated sxs/prior Treatment) Patient is a 65 y.o. male presenting with abdominal pain. The history is provided by the patient and medical records. No language interpreter was used.  Abdominal Pain Associated symptoms: chills, fever (subjective) and nausea   Associated symptoms: no cough, no dysuria, no shortness of breath and no vomiting      Ryan [name] is a 65 y.o. male  with multiple chronic medical conditions who presents to the Emergency Department with CT scan showing appendicitis. Patient states he had RLQ pain beginning Monday and acutely worsening yesterday. He was seen by PCP who ordered an outpatient CT. CT results show acute appendicitis and patient informed to come to ED. Patient admits to nausea, subjective fever. Denies vomiting. Has been NPO since this morning.   Past Medical History  Diagnosis Date  . Invasive ductal carcinoma of breast, stage 2 ([place]) 04/27/2011    chemo, mastectomy  . Right carotid artery occlusion 04/27/2011  . Left carotid artery partial occlusion 04/27/2011    CAROTIC DOPPLER 11/2013: < 40% R ICA stenosis, distal waveforms damped - suggests probably intracranial occlusoin, < [id number] LICA, normal vertebrals -- no change  . Stroke [facility])     2007  . Hypercholesterolemia   . Adie's pupil   . H/O Hodgkin's disease 46  . Thyroid disease   . S/P CABG x 3 04/09/2013    LIMA-LAD, fLRAD-RI, SVG-RCA; Echo 10/29/'15:  mild Conc LVH, no WMA, Gr 1 DD, Mod AoV Sclerosis w/ Mod AI, ~Mild-Mod MS   . CAD (coronary artery disease), native coronary artery 04/09/2013    Dist LM stenosis 70-80% (at trifurcation into LAD, CX, & 2 Ramus branches, 70% mid RCA);; b) Myoview 08/06/14: Low Risk, no Ischemia/infarction  . Essential  hypertension   . Mild mitral stenosis by prior echocardiogram 08/06/2014     Noted on echocardiogram  . Lymphoma ([place]) 1983    Aged 68  . Shortness of breath dyspnea   . Anemia 07/14/2013   Past Surgical History  Procedure Laterality Date  . Mastectomy  rt  . Lymph node biopsy      left neck  . Coronary artery bypass graft [name] 04/08/2013    Procedure: CORONARY ARTERY BYPASS GRAFTING (CABG);  Surgeon: Ivin Poot, MD;  Location: [place];  Service: Open Heart Surgery;  Laterality: [name];  Times 3 using left internal mammary artery; endoscopically harvested right saphenous vein and left radial artery  . Intraoperative transesophageal echocardiogram [name] 04/08/2013    Procedure: INTRAOPERATIVE TRANSESOPHAGEAL ECHOCARDIOGRAM;  Surgeon: Ivin Poot, MD;  Location: [place];  Service: Open Heart Surgery;  Laterality: [name];  . Radial artery harvest Left 04/08/2013    Procedure: RADIAL ARTERY HARVEST;  Surgeon: Ivin Poot, MD;  Location: [place];  Service: Open Heart Surgery;  Laterality: Left;  . Nm treadmill myoview ltd  08/06/2014    Exercise 5:07 min, 6.3 METS; symptoms noted or extreme dyspnea and lightheadedness with some chest tightness. No EKG changes. No ischemia or infarction was normal EF.  . Transthoracic echocardiogram  08/06/2014     Normal EF 55-60%. Gr 1 DD - increased LVEDP. Moderate aortic valve thickening suggestive of AoV Sclerosis but not stenosis. Mild AI; MIld MS - mean gradient of 7 mmHg &  HR of 122 bpm.  . Left heart catheterization with coronary angiogram [name] 04/07/2013    Procedure: LEFT HEART CATHETERIZATION WITH CORONARY ANGIOGRAM;  Surgeon: Leonie Man, MD;  Location: [facility] CATH LAB;  Service: Cardiovascular;  Laterality: [name];  . Colonoscopy [name] 05/06/2015    Procedure: COLONOSCOPY;  Surgeon: Rogene Houston, MD;  [facility];  Service: Endoscopy;  Laterality: [name];  930  . Cataract extraction w/phaco Right 06/21/2015    Procedure: CATARACT EXTRACTION PHACO AND  INTRAOCULAR LENS PLACEMENT (IOC);  Surgeon: Tonny Branch, MD;  Location: [facility] ORS;  Service: Ophthalmology;  Laterality: Right;  CDE: 7.05  . Cataract extraction w/phaco Left 07/01/2015    Procedure: CATARACT EXTRACTION PHACO AND INTRAOCULAR LENS PLACEMENT (IOC);  Surgeon: Tonny Branch, MD;  Location: [facility] OR[facility];  Service: Ophthalmology;  Laterality: Left;  CDE: 7.49   Family History  Problem Relation Age of Onset  . Alzheimer's disease Mother   . Stroke Mother   . Hypertension Mother   . Hyperlipidemia Mother   . Diabetes Mother   . Hypertension Father   . Diabetes Paternal Grandmother   . Diabetes Paternal Grandfather   . Breast cancer Paternal Aunt   . Ovarian cancer Paternal Aunt    Social History  Substance Use Topics  . Smoking status: Former Smoker -- 1.00 packs/day    Types: Cigarettes    Quit date: 06/09/1982  . Smokeless tobacco: Never Used  . Alcohol Use: No    Review of Systems  Constitutional: Positive for fever (subjective) and chills.  HENT: Negative for congestion.   Eyes: Negative for visual disturbance.  Respiratory: Negative for cough and shortness of breath.   Cardiovascular: Negative.   Gastrointestinal: Positive for nausea and abdominal pain. Negative for vomiting.  Genitourinary: Negative for dysuria.  Musculoskeletal: Negative for back pain and neck pain.  Skin: Negative for rash.  Neurological: Negative for headaches.      Allergies  Review of patient's allergies indicates no known allergies.  Home Medications   Prior to Admission medications   Medication Sig Start Date End Date Taking? Authorizing Provider  acetaminophen (TYLENOL) 500 MG tablet Take 500 mg by mouth every 6 (six) hours as needed for mild pain.    Yes Historical Provider, MD  Albuterol Sulfate (PROAIR RESPICLICK) 123XX123 (90 BASE) MCG/ACT AEPB Inhale 1 puff into the lungs 4 (four) times daily as needed (shortness of breath.).   Yes Historical Provider, MD  ALPRAZolam Duanne Moron) 1 MG tablet Take  0.5 mg by mouth at bedtime as needed for anxiety.    Yes Historical Provider, MD  aspirin EC 81 MG tablet Take 81 mg by mouth daily.    Yes Historical Provider, MD  Calcium Carbonate-Vit D-Min (CALCIUM 1200 PO) Take 1 tablet by mouth daily.    Yes Historical Provider, MD  Cholecalciferol (VITAMIN D-3 PO) Take 1 tablet by mouth daily.    Yes Historical Provider, MD  citalopram (CELEXA) 20 MG tablet Take 40 mg by mouth every evening.  07/06/14  Yes Historical Provider, MD  cyclobenzaprine (FLEXERIL) 10 MG tablet Take 10 mg by mouth 3 (three) times daily as needed for muscle spasms.   Yes Historical Provider, MD  diphenhydrAMINE (BENADRYL) 25 MG tablet Take 25 mg by mouth daily as needed for allergies.   Yes Historical Provider, MD  Garlic [id number] MG CAPS Take 500 mg by mouth daily.   Yes Historical Provider, MD  Ginkgo Biloba 60 MG CAPS Take 1 capsule by mouth daily.  Yes Historical Provider, MD  Cathrine Muster 550 MG CAPS Take 550 mg by mouth at bedtime.   Yes Historical Provider, MD  levothyroxine (SYNTHROID, LEVOTHROID) 75 MCG tablet Take 75 mcg by mouth daily.  08/29/14  Yes Historical Provider, MD  metoprolol tartrate (LOPRESSOR) 25 MG tablet Take 37.5 mg by mouth at bedtime.   Yes Historical Provider, MD  Omega-3 Fatty Acids (FISH OIL) 1000 MG CPDR Take 1 each by mouth daily.   Yes Historical Provider, MD  simvastatin (ZOCOR) 40 MG tablet Take 40 mg by mouth at bedtime.    Yes Historical Provider, MD  tamoxifen (NOLVADEX) 20 MG tablet Take 1 tablet (20 mg total) by mouth daily. 05/05/15  Yes Manon Hilding Kefalas, PA-C  Umeclidinium-Vilanterol (ANORO ELLIPTA) 62.5-25 MCG/INH AEPB Inhale 1 puff into the lungs daily.   Yes Historical Provider, MD  warfarin (COUMADIN) 3 MG tablet Take 3 mg by mouth daily. Take 3mg  on Monday Tuesday,thursday Friday, Sunday per wife. Wife states she is not too sure about dose   Yes Historical Provider, MD  warfarin (COUMADIN) 4 MG tablet Take 4 mg by mouth daily. Take on  Wednesday, Saturday,   Yes Historical Provider, MD  Coconut Oil 1000 MG CAPS Take 1,000 mg by mouth daily. Reported on 12/22/2015    Historical Provider, MD  iron polysaccharides (NIFEREX) 150 MG capsule Take 1 capsule (150 mg total) by mouth daily. Patient not taking: Reported on 12/15/2015 08/27/15   Baird Cancer, PA-C  metoprolol tartrate (LOPRESSOR) 25 MG tablet TAKE ONE-HALF TABLET BY MOUTH ONCE DAILY AT BEDTIME Patient not taking: Reported on 12/22/2015 09/27/15   Leonie Man, MD  nitroGLYCERIN (NITROSTAT) 0.4 MG SL tablet Place 1 tablet (0.4 mg total) under the tongue every 5 (five) minutes as needed for chest pain. 04/02/13   Leonie Man, MD  oxycodone (OXY-IR) 5 MG capsule Take 5 mg by mouth every 4 (four) hours as needed. Reported on 12/22/2015    Historical Provider, MD  warfarin (COUMADIN) 1 MG tablet Take as directed. Patient not taking: Reported on 12/22/2015 07/06/14   Farrel Gobble, MD  warfarin (COUMADIN) 4 MG tablet Take 1 tablet (4 mg total) by mouth daily. Patient not taking: Reported on 12/22/2015 03/11/15   Manon Hilding Kefalas, PA-C   BP 122/70 mmHg  Pulse 87  Temp(Src) 97.4 F (36.3 C) (Oral)  Resp 15  SpO2 98% Physical Exam  Constitutional: He is oriented to person, place, and time. He appears well-developed and well-nourished.  Alert and in no acute distress  HENT:  Head: Normocephalic and atraumatic.  Cardiovascular: Normal rate, regular rhythm and normal heart sounds.  Exam reveals no gallop and no friction rub.   No murmur heard. Pulmonary/Chest: Effort normal and breath sounds normal. No respiratory distress. He has no wheezes. He has no rales.  Abdominal: Soft. He exhibits no distension. There is tenderness. There is tenderness at McBurney's point.  Musculoskeletal: Normal range of motion.  Neurological: He is alert and oriented to person, place, and time.  Skin: Skin is warm and dry.  Psychiatric: He has a normal mood and affect. His behavior is normal.  Judgment and thought content normal.  Nursing note and vitals reviewed.   ED Course  Procedures (including critical care time) Labs Review Labs Reviewed  COMPREHENSIVE METABOLIC PANEL - Abnormal; Notable for the following:    Glucose, Bld 119 (*)    Creatinine, Ser 1.61 (*)    Calcium 8.8 (*)    Total Protein  6.1 (*)    ALT 13 (*)    Alkaline Phosphatase 37 (*)    GFR calc non Af Amer 43 (*)    GFR calc Af Amer 50 (*)    All other components within normal limits  CBC - Abnormal; Notable for the following:    WBC 12.0 (*)    Hemoglobin 12.8 (*)    All other components within normal limits  PROTIME-INR - Abnormal; Notable for the following:    Prothrombin Time 26.2 (*)    INR 2.44 (*)    All other components within normal limits  LIPASE, BLOOD  URINALYSIS, ROUTINE W REFLEX MICROSCOPIC (NOT AT [facility])  PROTIME-INR  CBC WITH DIFFERENTIAL/PLATELET    Imaging Review No results found. I have personally reviewed and evaluated these images and lab results as part of my medical decision-making.   EKG Interpretation None      MDM   Final diagnoses:  Acute appendicitis, unspecified acute appendicitis type   [name] presents to the ED with CT scan done as an outpatient showing acute appendicitis. All labs reviewed. White count of 12. On exam, patient with RLQ tenderness. VSS. General surgery was consulted who will admit for further evaluation and management.   [facility] Lougenia Morrissey, PA-C 12/22/15 1918  Ripley Fraise, MD 12/22/15 2103

## 2015-12-22 NOTE — ED Notes (Signed)
Attempted to call report x 1  

## 2015-12-23 ENCOUNTER — Encounter (HOSPITAL_COMMUNITY): Admission: EM | Disposition: A | Discharge status: Home / Self Care | Payer: Self-pay | Source: Home / Self Care

## 2015-12-23 ENCOUNTER — Inpatient Hospital Stay (HOSPITAL_COMMUNITY): Payer: Medicare Other | Admitting: Certified Registered Nurse Anesthetist

## 2015-12-23 ENCOUNTER — Other Ambulatory Visit: Payer: Self-pay

## 2015-12-23 DIAGNOSIS — K3580 Unspecified acute appendicitis: Secondary | ICD-10-CM | POA: Diagnosis not present

## 2015-12-23 HISTORY — PX: LAPAROSCOPIC APPENDECTOMY: SHX408

## 2015-12-23 LAB — CBC WITH DIFFERENTIAL/PLATELET
BASOS PCT: 1 %
Basophils Absolute: 0.1 10*3/uL (ref 0.0–0.1)
EOS ABS: 0.2 10*3/uL (ref 0.0–0.7)
Eosinophils Relative: 2 %
HCT: 37.8 % — ABNORMAL LOW (ref 39.0–52.0)
Hemoglobin: 12 g/dL — ABNORMAL LOW (ref 13.0–17.0)
Lymphocytes Relative: 25 %
Lymphs Abs: 2.2 10*3/uL (ref 0.7–4.0)
MCH: 29 pg (ref 26.0–34.0)
MCHC: 31.7 g/dL (ref 30.0–36.0)
MCV: 91.3 fL (ref 78.0–100.0)
MONO ABS: 0.7 10*3/uL (ref 0.1–1.0)
MONOS PCT: 8 %
NEUTROS PCT: 64 %
Neutro Abs: 5.6 10*3/uL (ref 1.7–7.7)
PLATELETS: 162 10*3/uL (ref 150–400)
RBC: 4.14 MIL/uL — ABNORMAL LOW (ref 4.22–5.81)
RDW: 13.9 % (ref 11.5–15.5)
WBC: 8.8 10*3/uL (ref 4.0–10.5)

## 2015-12-23 LAB — PROTIME-INR
INR: 1.8 — ABNORMAL HIGH (ref 0.00–1.49)
PROTHROMBIN TIME: 20.9 s — AB (ref 11.6–15.2)

## 2015-12-23 SURGERY — APPENDECTOMY, LAPAROSCOPIC
Anesthesia: General | Site: Abdomen

## 2015-12-23 MED ORDER — FENTANYL CITRATE (PF) 100 MCG/2ML IJ SOLN
INTRAMUSCULAR | Status: DC | PRN
  Administered 2015-12-23 (×4): 50 ug via INTRAVENOUS

## 2015-12-23 MED ORDER — OXYCODONE HCL 5 MG PO TABS: 5.0000 mg | ORAL_TABLET | Freq: Once | ORAL | Status: DC | PRN

## 2015-12-23 MED ORDER — OXYCODONE HCL 5 MG/5ML PO SOLN: 5.0000 mg | Freq: Once | ORAL | Status: DC | PRN

## 2015-12-23 MED ORDER — OXYCODONE-ACETAMINOPHEN 5-325 MG PO TABS: 1.0000 | ORAL_TABLET | ORAL | Status: DC | PRN

## 2015-12-23 MED ORDER — ROCURONIUM BROMIDE 100 MG/10ML IV SOLN
INTRAVENOUS | Status: DC | PRN
  Administered 2015-12-23: 40 mg via INTRAVENOUS

## 2015-12-23 MED ORDER — SODIUM CHLORIDE 0.9 % IV SOLN: Freq: Once | INTRAVENOUS | Status: DC

## 2015-12-23 MED ORDER — MIDAZOLAM HCL 5 MG/5ML IJ SOLN
INTRAMUSCULAR | Status: DC | PRN
  Administered 2015-12-23: 2 mg via INTRAVENOUS

## 2015-12-23 MED ORDER — ONDANSETRON HCL 4 MG/2ML IJ SOLN
4.0000 mg | Freq: Four times a day (QID) | INTRAMUSCULAR | Status: DC | PRN
Start: 2015-12-23 — End: 2015-12-23

## 2015-12-23 MED ORDER — MIDAZOLAM HCL 2 MG/2ML IJ SOLN
INTRAMUSCULAR | Status: AC
  Filled 2015-12-23: qty 2

## 2015-12-23 MED ORDER — ONDANSETRON HCL 4 MG/2ML IJ SOLN
INTRAMUSCULAR | Status: DC | PRN
  Administered 2015-12-23: 4 mg via INTRAVENOUS

## 2015-12-23 MED ORDER — FENTANYL CITRATE (PF) 250 MCG/5ML IJ SOLN
INTRAMUSCULAR | Status: AC
  Filled 2015-12-23: qty 5

## 2015-12-23 MED ORDER — PHENYLEPHRINE HCL 10 MG/ML IJ SOLN
INTRAMUSCULAR | Status: DC | PRN
  Administered 2015-12-23 (×2): 80 ug via INTRAVENOUS

## 2015-12-23 MED ORDER — SODIUM CHLORIDE 0.9 % IR SOLN
Status: DC | PRN
Start: 2015-12-23 — End: 2015-12-23
  Administered 2015-12-23: 1000 mL

## 2015-12-23 MED ORDER — NEOSTIGMINE METHYLSULFATE 10 MG/10ML IV SOLN
INTRAVENOUS | Status: DC | PRN
  Administered 2015-12-23: 5 mg via INTRAVENOUS

## 2015-12-23 MED ORDER — ROCURONIUM BROMIDE 50 MG/5ML IV SOLN
INTRAVENOUS | Status: AC
  Filled 2015-12-23: qty 1

## 2015-12-23 MED ORDER — LACTATED RINGERS IV SOLN
INTRAVENOUS | Status: DC
  Administered 2015-12-23: 08:00:00 via INTRAVENOUS

## 2015-12-23 MED ORDER — NEOSTIGMINE METHYLSULFATE 10 MG/10ML IV SOLN
INTRAVENOUS | Status: AC
  Filled 2015-12-23: qty 1

## 2015-12-23 MED ORDER — PROPOFOL 10 MG/ML IV BOLUS
INTRAVENOUS | Status: AC
  Filled 2015-12-23: qty 20

## 2015-12-23 MED ORDER — GLYCOPYRROLATE 0.2 MG/ML IJ SOLN
INTRAMUSCULAR | Status: DC | PRN
  Administered 2015-12-23: .8 mg via INTRAVENOUS

## 2015-12-23 MED ORDER — BUPIVACAINE-EPINEPHRINE 0.25% -1:200000 IJ SOLN
INTRAMUSCULAR | Status: DC | PRN
  Administered 2015-12-23: 7 mL

## 2015-12-23 MED ORDER — BUPIVACAINE-EPINEPHRINE (PF) 0.25% -1:200000 IJ SOLN
INTRAMUSCULAR | Status: AC
  Filled 2015-12-23: qty 30

## 2015-12-23 MED ORDER — PROPOFOL 10 MG/ML IV BOLUS
INTRAVENOUS | Status: DC | PRN
  Administered 2015-12-23: 150 mg via INTRAVENOUS

## 2015-12-23 MED ORDER — EPHEDRINE SULFATE 50 MG/ML IJ SOLN
INTRAMUSCULAR | Status: DC | PRN
  Administered 2015-12-23 (×3): 10 mg via INTRAVENOUS

## 2015-12-23 MED ORDER — 0.9 % SODIUM CHLORIDE (POUR BTL) OPTIME
TOPICAL | Status: DC | PRN
  Administered 2015-12-23: 1000 mL

## 2015-12-23 MED ORDER — LIDOCAINE HCL (CARDIAC) 20 MG/ML IV SOLN
INTRAVENOUS | Status: DC | PRN
  Administered 2015-12-23: 50 mg via INTRATRACHEAL
  Administered 2015-12-23: 50 mg via INTRAVENOUS

## 2015-12-23 MED ORDER — ONDANSETRON HCL 4 MG/2ML IJ SOLN
INTRAMUSCULAR | Status: AC
  Filled 2015-12-23: qty 2

## 2015-12-23 MED ORDER — SODIUM CHLORIDE 0.9 % IV SOLN
INTRAVENOUS | Status: DC | PRN
Start: 2015-12-23 — End: 2015-12-23
  Administered 2015-12-23: 09:00:00 via INTRAVENOUS

## 2015-12-23 MED ORDER — HYDROMORPHONE HCL 1 MG/ML IJ SOLN: 0.2500 mg | INTRAMUSCULAR | Status: DC | PRN

## 2015-12-23 MED ORDER — LIDOCAINE HCL (CARDIAC) 20 MG/ML IV SOLN
INTRAVENOUS | Status: AC
  Filled 2015-12-23: qty 5

## 2015-12-23 MED ORDER — GLYCOPYRROLATE 0.2 MG/ML IJ SOLN
INTRAMUSCULAR | Status: AC
  Filled 2015-12-23: qty 4

## 2015-12-23 MED ORDER — GLYCOPYRROLATE 0.2 MG/ML IJ SOLN
INTRAMUSCULAR | Status: AC
  Filled 2015-12-23: qty 1

## 2015-12-23 SURGICAL SUPPLY — 43 items
APPLIER CLIP ROT 10 11.4 M/L (STAPLE)
BENZOIN TINCTURE PRP APPL 2/3 (GAUZE/BANDAGES/DRESSINGS) ×3 IMPLANT
BLADE SURG ROTATE 9660 (MISCELLANEOUS) ×3 IMPLANT
CANISTER SUCTION 2500CC (MISCELLANEOUS) ×3 IMPLANT
CHLORAPREP W/TINT 26ML (MISCELLANEOUS) ×3 IMPLANT
CLIP APPLIE ROT 10 11.4 M/L (STAPLE) IMPLANT
CLOSURE WOUND 1/2 X4 (GAUZE/BANDAGES/DRESSINGS) ×1
COVER SURGICAL LIGHT HANDLE (MISCELLANEOUS) ×3 IMPLANT
CUTTER FLEX LINEAR 45M (STAPLE) ×3 IMPLANT
DRSG TEGADERM 2-3/8X2-3/4 SM (GAUZE/BANDAGES/DRESSINGS) ×6 IMPLANT
DRSG TEGADERM 4X4.75 (GAUZE/BANDAGES/DRESSINGS) ×3 IMPLANT
ELECT REM PT RETURN 9FT ADLT (ELECTROSURGICAL) ×3
ELECTRODE REM PT RTRN 9FT ADLT (ELECTROSURGICAL) ×1 IMPLANT
ENDOLOOP SUT PDS II  0 18 (SUTURE)
ENDOLOOP SUT PDS II 0 18 (SUTURE) IMPLANT
FILTER SMOKE EVAC LAPAROSHD (FILTER) ×3 IMPLANT
GAUZE SPONGE 2X2 8PLY STRL LF (GAUZE/BANDAGES/DRESSINGS) ×1 IMPLANT
GLOVE BIO SURGEON STRL SZ7 (GLOVE) ×3 IMPLANT
GLOVE BIOGEL PI IND STRL 7.5 (GLOVE) ×1 IMPLANT
GLOVE BIOGEL PI IND STRL 8 (GLOVE) ×1 IMPLANT
GLOVE BIOGEL PI INDICATOR 7.5 (GLOVE) ×2
GLOVE BIOGEL PI INDICATOR 8 (GLOVE) ×2
GLOVE SURG SS PI 7.5 STRL IVOR (GLOVE) ×3 IMPLANT
GOWN STRL REUS W/ TWL LRG LVL3 (GOWN DISPOSABLE) ×2 IMPLANT
GOWN STRL REUS W/TWL LRG LVL3 (GOWN DISPOSABLE) ×4
KIT BASIN OR (CUSTOM PROCEDURE TRAY) ×3 IMPLANT
KIT ROOM TURNOVER OR (KITS) ×3 IMPLANT
NS IRRIG 1000ML POUR BTL (IV SOLUTION) ×3 IMPLANT
PAD ARMBOARD 7.5X6 YLW CONV (MISCELLANEOUS) ×6 IMPLANT
POUCH SPECIMEN RETRIEVAL 10MM (ENDOMECHANICALS) ×3 IMPLANT
RELOAD STAPLE TA45 3.5 REG BLU (ENDOMECHANICALS) ×3 IMPLANT
SCALPEL HARMONIC ACE (MISCELLANEOUS) ×3 IMPLANT
SET IRRIG TUBING LAPAROSCOPIC (IRRIGATION / IRRIGATOR) ×3 IMPLANT
SPECIMEN JAR SMALL (MISCELLANEOUS) ×3 IMPLANT
SPONGE GAUZE 2X2 STER 10/PKG (GAUZE/BANDAGES/DRESSINGS) ×2
STRIP CLOSURE SKIN 1/2X4 (GAUZE/BANDAGES/DRESSINGS) ×2 IMPLANT
SUT MNCRL AB 4-0 PS2 18 (SUTURE) ×3 IMPLANT
TOWEL OR 17X24 6PK STRL BLUE (TOWEL DISPOSABLE) IMPLANT
TOWEL OR 17X26 10 PK STRL BLUE (TOWEL DISPOSABLE) ×3 IMPLANT
TRAY LAPAROSCOPIC MC (CUSTOM PROCEDURE TRAY) ×3 IMPLANT
TROCAR XCEL BLADELESS 5X75MML (TROCAR) ×6 IMPLANT
TROCAR XCEL BLUNT TIP 100MML (ENDOMECHANICALS) ×3 IMPLANT
TUBING INSUFFLATION (TUBING) ×3 IMPLANT

## 2015-12-23 NOTE — Transfer of Care (Signed)
Immediate Anesthesia Transfer of Care Note  Patient: Ryan [name]  Procedure(s) Performed: Procedure(s): APPENDECTOMY LAPAROSCOPIC ([name])  Patient Location: PACU  Anesthesia Type:General  Level of Consciousness: awake and alert   Airway & Oxygen Therapy: Patient Spontanous Breathing and Patient connected to face mask oxygen  Post-op Assessment: Report given to RN and Post -op Vital signs reviewed and stable  Post vital signs: Reviewed and stable  Last Vitals:  Filed Vitals:   12/23/15 0145 12/23/15 0630  BP: 109/58 143/60  Pulse: 73 77  Temp: 36.8 C 36.6 C  Resp: 18 17    Complications: No apparent anesthesia complications

## 2015-12-23 NOTE — Op Note (Signed)
Appendectomy, Lap, Procedure Note  Indications: The patient presented with a history of right-sided abdominal pain. A CT scan revealed findings consistent with acute appendicitis.  The patient is anticoagulated on Coumadin, so he required FFP and Vit K prior to surgery.  Pre-operative Diagnosis: Acute appendicitis with generalized peritonitis  Post-operative Diagnosis: Same  Surgeon: Harolyn Cocker K.   Assistants: none  Anesthesia: General endotracheal anesthesia  ASA Class: 2E  Procedure Details  The patient was seen again in the Holding Room. The risks, benefits, complications, treatment options, and expected outcomes were discussed with the patient and/or family. The possibilities of reaction to medication, perforation of viscus, bleeding, recurrent infection, finding a normal appendix, the need for additional procedures, failure to diagnose a condition, and creating a complication requiring transfusion or operation were discussed. There was concurrence with the proposed plan and informed consent was obtained. The site of surgery was properly noted. The patient was taken to Operating Room, identified as [name] and the procedure verified as Appendectomy. A Time Out was held and the above information confirmed.  The patient was placed in the supine position and general anesthesia was induced.  The abdomen was prepped and draped in a sterile fashion. A one centimeter infraumbilical incision was made.  Dissection was carried down to the fascia bluntly.  The fascia was incised vertically.  We entered the peritoneal cavity bluntly.  A pursestring suture was passed around the incision with a 0 Vicryl.  The Hasson cannula was introduced into the abdomen and the tails of the suture were used to hold the Hasson in place.   The pneumoperitoneum was then established maintaining a maximum pressure of 15 mmHg.  Additional 5 mm cannulas then placed in the left lower quadrant of the abdomen and the  right upper quadrant under direct visualization. A careful evaluation of the entire abdomen was carried out. The patient was placed in left lateral decubitus position.  The scope was moved to the right upper quadrant port site. The cecum was mobilized medially.  The appendix was identified.  It was quite swollen, but there was no sign of perforation.  No abscess was noted.  The appendix was carefully dissected. The appendix was skeletonized with the harmonic scalpel.   The appendix was divided at its base using an endo-GIA stapler. Minimal appendiceal stump was left in place. There was no evidence of bleeding, leakage, or complication after division of the appendix. Irrigation was also performed and irrigate suctioned from the abdomen as well.  The umbilical port site was closed with the purse string suture. There was no residual palpable fascial defect.  The trocar site skin wounds were closed with 4-0 Monocryl.  Instrument, sponge, and needle counts were correct at the conclusion of the case.   Findings: The appendix was found to be inflamed. There were not signs of necrosis.  There was not perforation. There was not abscess formation.  Estimated Blood Loss:  Minimal         Drains: none         Specimens: Appendix         Complications:  None; patient tolerated the procedure well.         Disposition: PACU - hemodynamically stable.         Condition: stable  Imogene Burn. Georgette Dover, MD, [facility] Surgery  General/ Trauma Surgery  12/23/2015 9:45 AM'

## 2015-12-23 NOTE — Anesthesia Preprocedure Evaluation (Signed)
Anesthesia Evaluation  Patient identified by MRN, date of birth, ID band Patient awake    Reviewed: Allergy & Precautions, NPO status , Patient's Chart, lab work & pertinent test results  Airway Mallampati: II   Neck ROM: full    Dental   Pulmonary former smoker,    breath sounds clear to auscultation       Cardiovascular hypertension, + CAD, + CABG and + Peripheral Vascular Disease   Rhythm:regular Rate:Normal  CABG in 2014. Myoview on10/29/15: Low Risk, no Ischemia/infarction.   Neuro/Psych PSYCHIATRIC DISORDERS Depression    GI/Hepatic   Endo/Other  Hypothyroidism obese  Renal/GU      Musculoskeletal   Abdominal   Peds  Hematology   Anesthesia Other Findings   Reproductive/Obstetrics                             Anesthesia Physical Anesthesia Plan  ASA: III  Anesthesia Plan: General   Post-op Pain Management:    Induction: Intravenous  Airway Management Planned: Oral ETT  Additional Equipment:   Intra-op Plan:   Post-operative Plan: Extubation in OR  Informed Consent: I have reviewed the patients History and Physical, chart, labs and discussed the procedure including the risks, benefits and alternatives for the proposed anesthesia with the patient or authorized representative who has indicated his/her understanding and acceptance.     Plan Discussed with: CRNA, Anesthesiologist and Surgeon  Anesthesia Plan Comments:         Anesthesia Quick Evaluation

## 2015-12-23 NOTE — Progress Notes (Signed)
Returned from PACU at this time, wife at bedside, denies pain/nausea

## 2015-12-23 NOTE — Progress Notes (Signed)
Do not give PO Lopressor in preop per Dr. Marcie Bal.

## 2015-12-23 NOTE — Progress Notes (Signed)
Day of Surgery  Subjective: Still with some RLQ tenderness INR 1.8 this morning - 2 units FFP ordered, but not yet transfused  Objective: Vital signs in last 24 hours: Temp:  [97.4 F (36.3 C)-98.3 F (36.8 C)] 97.9 F (36.6 C) (03/16 0630) Pulse Rate:  [73-106] 77 (03/16 0630) Resp:  [14-22] 17 (03/16 0630) BP: (101-155)/(58-89) 143/60 mmHg (03/16 0630) SpO2:  [96 %-100 %] 99 % (03/16 0630) Weight:  [109.8 kg (242 lb 1 oz)] 109.8 kg (242 lb 1 oz) (03/15 2040) Last BM Date: 12/21/15  Intake/Output from previous day: 03/15 0701 - 03/16 0700 In: 800 [I.V.:800] Out: -  Intake/Output this shift:    General appearance: alert, cooperative and no distress GI: RLQ tenderness  Lab Results:   Recent Labs  12/22/15 1645 12/23/15 0448  WBC 12.0* 8.8  HGB 12.8* 12.0*  HCT 40.6 37.8*  PLT 209 162   BMET  Recent Labs  12/22/15 1645  NA 141  K 4.0  CL 104  CO2 25  GLUCOSE 119*  BUN 14  CREATININE 1.61*  CALCIUM 8.8*   PT/INR  Recent Labs  12/22/15 2230 12/23/15 0448  LABPROT 25.5* 20.9*  INR 2.36* 1.80*   ABG No results for input(s): PHART, HCO3 in the last 72 hours.  Invalid input(s): PCO2, PO2  Studies/Results: No results found.  Anti-infectives: Anti-infectives    Start     Dose/Rate Route Frequency Ordered Stop   12/23/15 2000  cefTRIAXone (ROCEPHIN) 2 g in dextrose 5 % 50 mL IVPB     2 g 100 mL/hr over 30 Minutes Intravenous Every 24 hours 12/22/15 2037     12/23/15 0400  metroNIDAZOLE (FLAGYL) IVPB 500 mg     500 mg 100 mL/hr over 60 Minutes Intravenous Every 8 hours 12/22/15 2037     12/22/15 1915  cefTRIAXone (ROCEPHIN) 2 g in dextrose 5 % 50 mL IVPB  Status:  Discontinued     2 g 100 mL/hr over 30 Minutes Intravenous Every 24 hours 12/22/15 1909 12/22/15 2037   12/22/15 1915  metroNIDAZOLE (FLAGYL) IVPB 500 mg  Status:  Discontinued     500 mg 100 mL/hr over 60 Minutes Intravenous Every 8 hours 12/22/15 1909 12/22/15 2037   12/22/15 1830   cefTRIAXone (ROCEPHIN) 1 g in dextrose 5 % 50 mL IVPB     1 g 100 mL/hr over 30 Minutes Intravenous  Once 12/22/15 1820 12/22/15 1857   12/22/15 1830  metroNIDAZOLE (FLAGYL) IVPB 500 mg     500 mg 100 mL/hr over 60 Minutes Intravenous  Once 12/22/15 1820 12/22/15 1941      Assessment/Plan: s/p Procedure(s): APPENDECTOMY LAPAROSCOPIC ([name]) To OR this morning for laparoscopic appendectomy.  We will transfuse FFP in the OR The surgical procedure has been discussed with the patient.  Potential risks, benefits, alternative treatments, and expected outcomes have been explained.  All of the patient's questions at this time have been answered.  The likelihood of reaching the patient's treatment goal is good.  The patient understand the proposed surgical procedure and wishes to proceed.   LOS: 1 day    Kody Vigil K. 12/23/2015

## 2015-12-23 NOTE — Anesthesia Procedure Notes (Signed)
Procedure Name: Intubation Date/Time: 12/23/2015 8:42 AM Performed by: YACOUB, ALISHA B Pre-anesthesia Checklist: Patient identified, Emergency Drugs available, Suction available, Patient being monitored and Timeout performed Patient Re-evaluated:Patient Re-evaluated prior to inductionOxygen Delivery Method: Circle system utilized Preoxygenation: Pre-oxygenation with 100% oxygen Intubation Type: IV induction Ventilation: Oral airway inserted - appropriate to patient size and Mask ventilation without difficulty Laryngoscope Size: Mac and 4 Grade View: Grade II Tube type: Oral Tube size: 7.0 mm Number of attempts: 1 Airway Equipment and Method: Stylet and LTA kit utilized Placement Confirmation: ETT inserted through vocal cords under direct vision,  positive ETCO2 and breath sounds checked- equal and bilateral Secured at: 23 cm Tube secured with: Tape Dental Injury: Teeth and Oropharynx as per pre-operative assessment      

## 2015-12-24 ENCOUNTER — Encounter (HOSPITAL_COMMUNITY): Payer: Self-pay | Admitting: Surgery

## 2015-12-24 LAB — PREPARE FRESH FROZEN PLASMA
UNIT DIVISION: 0
Unit division: 0

## 2015-12-24 LAB — PROTIME-INR
INR: 1.21 (ref 0.00–1.49)
PROTHROMBIN TIME: 15.5 s — AB (ref 11.6–15.2)

## 2015-12-24 MED ORDER — OXYCODONE-ACETAMINOPHEN 5-325 MG PO TABS: 1.0000 | ORAL_TABLET | ORAL | Status: DC | PRN

## 2015-12-24 NOTE — Care Management Important Message (Signed)
Important Message  Patient Details  Name: Ryan [name] MRN: [id number] Date of Birth: 1951-06-20   Medicare Important Message Given:  Yes    Zophia Marrone P Tor Tsuda 12/24/2015, 3:12 PM

## 2015-12-24 NOTE — Discharge Summary (Signed)
Physician Discharge Summary  DIXON VROOM [id number] DOB: [birth year] DOA: 12/22/2015  PCP: [name], MD  Consultation: none  Admit date: 12/22/2015 Discharge date: 12/24/2015  Recommendations for Outpatient Follow-up:   Follow-up Information    Follow up with [facility] On 01/12/2016.   Specialty:  General Surgery   Why:  arrive by 0930AM for a 10AM post op check    Contact information:   [address] [place] [address] [phone]       Follow up with KEFALAS,THOMAS, PA-C On 12/27/2015.   Specialty:  Oncology   Why:  to have your INR checked    Contact information:   [place] [address] [id number]      Discharge Diagnoses:  1. Acute appendicitis   Surgical Procedure: laparoscopic appendectomy---Dr. Georgette Dover   Discharge Condition: stable Disposition: home  Diet recommendation: regular  Filed Weights   12/22/15 2040  Weight: 109.8 kg (242 lb 1 oz)     Filed Vitals:   12/23/15 2100 12/24/15 0556  BP: 132/65 111/67  Pulse: 99 84  Temp: 98.3 F (36.8 C) 98.2 F (36.8 C)  Resp: 19 19     Hospital Course:  Ryan [name] is a 65 year old male with a history of hodgkin's lymphoma, carotid artery occlusion on cuomadin for stroke prevention, breast cancer, CAD who presented from his PCPs office with acute appendicitis following a CT scan.  The patient was started on antibiotics and INR was reversed with 5mg  of vitamin K and 2 units of FFP.  The patient had an appendectomy on 12/23/15.  He tolerated the procedure well and was transferred to the floor.  Remained stable overnight.  On POD#1 he was tolerating a diet, ambulating, had minimal pain, afebrile, voiding and passing flatus.  He was therefore felt stable for discharge home.  I advised him to take 4mg  of coumadin Friday, Saturday and Sunday and to have an INR checked on Monday with Dr. Pierre Bali office for further titration.  He verbalizes understanding.  A  follow up has been scheduled in our office on his behalf. He was encouraged to call with questions or concerns.   Physical Exam: General: NAD Lungs:cta. Abdomen: soft, mild ttp over incisions.  Incisions are c/d/i.    Discharge Instructions     Medication List    STOP taking these medications        iron polysaccharides 150 MG capsule  Commonly known as:  NIFEREX     oxycodone 5 MG capsule  Commonly known as:  OXY-IR      TAKE these medications        acetaminophen 500 MG tablet  Commonly known as:  TYLENOL  Take 500 mg by mouth every 6 (six) hours as needed for mild pain.     ALPRAZolam 1 MG tablet  Commonly known as:  XANAX  Take 0.5 mg by mouth at bedtime as needed for anxiety.     ANORO ELLIPTA 62.5-25 MCG/INH Aepb  Generic drug:  umeclidinium-vilanterol  Inhale 1 puff into the lungs daily.     aspirin EC 81 MG tablet  Take 81 mg by mouth daily.     CALCIUM 1200 PO  Take 1 tablet by mouth daily.     citalopram 20 MG tablet  Commonly known as:  CELEXA  Take 40 mg by mouth every evening.     Coconut Oil 1000 MG Caps  Take 1,000 mg by mouth daily. Reported on 12/22/2015  cyclobenzaprine 10 MG tablet  Commonly known as:  FLEXERIL  Take 10 mg by mouth 3 (three) times daily as needed for muscle spasms.     diphenhydrAMINE 25 MG tablet  Commonly known as:  BENADRYL  Take 25 mg by mouth daily as needed for allergies.     Fish Oil 1000 MG Cpdr  Take 1 each by mouth daily.     Garlic [id number] MG Caps  Take 500 mg by mouth daily.     Ginkgo Biloba 60 MG Caps  Take 1 capsule by mouth daily.     Hawthorne Berry 550 MG Caps  Take 550 mg by mouth at bedtime.     levothyroxine 75 MCG tablet  Commonly known as:  SYNTHROID, LEVOTHROID  Take 75 mcg by mouth daily.     metoprolol tartrate 25 MG tablet  Commonly known as:  LOPRESSOR  Take 37.5 mg by mouth at bedtime.     nitroGLYCERIN 0.4 MG SL tablet  Commonly known as:  NITROSTAT  Place 1 tablet (0.4 mg  total) under the tongue every 5 (five) minutes as needed for chest pain.     oxyCODONE-acetaminophen 5-325 MG tablet  Commonly known as:  PERCOCET/ROXICET  Take 1 tablet by mouth every 4 (four) hours as needed for moderate pain.     PROAIR RESPICLICK 123XX123 (90 Base) MCG/ACT Aepb  Generic drug:  Albuterol Sulfate  Inhale 1 puff into the lungs 4 (four) times daily as needed (shortness of breath.).     simvastatin 40 MG tablet  Commonly known as:  ZOCOR  Take 40 mg by mouth at bedtime.     tamoxifen 20 MG tablet  Commonly known as:  NOLVADEX  Take 1 tablet (20 mg total) by mouth daily.     VITAMIN D-3 PO  Take 1 tablet by mouth daily.     warfarin 3 MG tablet  Commonly known as:  COUMADIN  Take 3 mg by mouth daily. Take 3mg  on Monday Tuesday,thursday Friday, Sunday per wife. Wife states she is not too sure about dose     warfarin 4 MG tablet  Commonly known as:  COUMADIN  Take 1 tablet (4 mg total) by mouth daily.           Follow-up Information    Follow up with [facility] On 01/12/2016.   Specialty:  General Surgery   Why:  arrive by [id number] for a 10AM post op check    Contact information:   [place] [address] [phone]        The results of significant diagnostics from this hospitalization (including imaging, microbiology, ancillary and laboratory) are listed below for reference.    Significant Diagnostic Studies: No results found.  Microbiology: No results found for this or any previous visit (from the past 240 hour(s)).   Labs: Basic Metabolic Panel:  Recent Labs Lab 12/22/15 1645  NA 141  K 4.0  CL 104  CO2 25  GLUCOSE 119*  BUN 14  CREATININE 1.61*  CALCIUM 8.8*   Liver Function Tests:  Recent Labs Lab 12/22/15 1645  AST 23  ALT 13*  ALKPHOS 37*  BILITOT 0.9  PROT 6.1*  ALBUMIN 3.5    Recent Labs Lab 12/22/15 1645  LIPASE 26   No results for input(s): AMMONIA in the last 168  hours. CBC:  Recent Labs Lab 12/22/15 1645 12/23/15 0448  WBC 12.0* 8.8  NEUTROABS  --  5.6  HGB 12.8*  12.0*  HCT 40.6 37.8*  MCV 92.1 91.3  PLT 209 162   Cardiac Enzymes: No results for input(s): CKTOTAL, CKMB, CKMBINDEX, TROPONINI in the last 168 hours. BNP: BNP (last 3 results) No results for input(s): BNP in the last 8760 hours.  ProBNP (last 3 results) No results for input(s): PROBNP in the last 8760 hours.  CBG: No results for input(s): GLUCAP in the last 168 hours.  Active Problems:   Acute appendicitis   Time coordinating discharge: <30 mins   Signed:  Reba Hulett, ANP-BC

## 2015-12-24 NOTE — Discharge Instructions (Signed)
Take 4mg  of coumadin Friday, Saturday and Sunday.  INR to be rechecked on Monday.    LAPAROSCOPIC SURGERY: POST OP INSTRUCTIONS  1. DIET: Follow a light bland diet the first 24 hours after arrival home, such as soup, liquids, crackers, etc.  Be sure to include lots of fluids daily.  Avoid fast food or heavy meals as your are more likely to get nauseated.  Eat a low fat the next few days after surgery.   2. Take your usually prescribed home medications unless otherwise directed. 3. PAIN CONTROL: a. Pain is best controlled by a usual combination of three different methods TOGETHER: i. Ice/Heat ii. Over the counter pain medication iii. Prescription pain medication b. Most patients will experience some swelling and bruising around the incisions.  Ice packs or heating pads (30-60 minutes up to 6 times a day) will help. Use ice for the first few days to help decrease swelling and bruising, then switch to heat to help relax tight/sore spots and speed recovery.  Some people prefer to use ice alone, heat alone, alternating between ice & heat.  Experiment to what works for you.  Swelling and bruising can take several weeks to resolve.   c. It is helpful to take an over-the-counter pain medication regularly for the first few weeks.  Choose one of the following that works best for you: i. Naproxen (Aleve, etc)  Two 220mg  tabs twice a day ii. Ibuprofen (Advil, etc) Three 200mg  tabs four times a day (every meal & bedtime) iii. Acetaminophen (Tylenol, etc) 500-650mg  four times a day (every meal & bedtime) d. A  prescription for pain medication (such as oxycodone, hydrocodone, etc) should be given to you upon discharge.  Take your pain medication as prescribed.  i. If you are having problems/concerns with the prescription medicine (does not control pain, nausea, vomiting, rash, itching, etc), please call us [phone] to see if we need to switch you to a different pain medicine that will work better for you  and/or control your side effect better. ii. If you need a refill on your pain medication, please contact your pharmacy.  They will contact our office to request authorization. Prescriptions will not be filled after 5 pm or on week-ends. 4. Avoid getting constipated.  Between the surgery and the pain medications, it is common to experience some constipation.  Increasing fluid intake and taking a fiber supplement (such as Metamucil, Citrucel, FiberCon, MiraLax, etc) 1-2 times a day regularly will usually help prevent this problem from occurring.  A mild laxative (prune juice, Milk of Magnesia, MiraLax, etc) should be taken according to package directions if there are no bowel movements after 48 hours.   5. Watch out for diarrhea.  If you have many loose bowel movements, simplify your diet to bland foods & liquids for a few days.  Stop any stool softeners and decrease your fiber supplement.  Switching to mild anti-diarrheal medications (Kayopectate, Pepto Bismol) can help.  If this worsens or does not improve, please call us. 6. Wash / shower every day.  You may shower over the dressings as they are waterproof.  Continue to shower over incision(s) after the dressing is off. 7. Remove your waterproof bandages 5 days after surgery.  You may leave the incision open to air.  You may replace a dressing/Band-Aid to cover the incision for comfort if you wish.  8. ACTIVITIES as tolerated:   a. You may resume regular (light) daily activities beginning the next day--such as daily  self-care, walking, climbing stairs--gradually increasing activities as tolerated.  If you can walk 30 minutes without difficulty, it is safe to try more intense activity such as jogging, treadmill, bicycling, low-impact aerobics, swimming, etc. b. Save the most intensive and strenuous activity for last such as sit-ups, heavy lifting, contact sports, etc  Refrain from any heavy lifting or straining until you are off narcotics for pain control.    c. DO NOT PUSH THROUGH PAIN.  Let pain be your guide: If it hurts to do something, don't do it.  Pain is your body warning you to avoid that activity for another week until the pain goes down. d. You may drive when you are no longer taking prescription pain medication, you can comfortably wear a seatbelt, and you can safely maneuver your car and apply brakes. e. Dennis Bast may have sexual intercourse when it is comfortable.  9. FOLLOW UP in our office a. Please call [facility] at [phone] to set up an appointment to see your surgeon in the office for a follow-up appointment approximately 2-3 weeks after your surgery. b. Make sure that you call for this appointment the day you arrive home to insure a convenient appointment time. 10. IF YOU HAVE DISABILITY OR FAMILY LEAVE FORMS, BRING THEM TO THE OFFICE FOR PROCESSING.  DO NOT GIVE THEM TO YOUR DOCTOR.   WHEN TO CALL us [phone]: 1. Poor pain control 2. Reactions / problems with new medications (rash/itching, nausea, etc)  3. Fever over 101.5 F (38.5 C) 4. Inability to urinate 5. Nausea and/or vomiting 6. Worsening swelling or bruising 7. Continued bleeding from incision. 8. Increased pain, redness, or drainage from the incision   The clinic staff is available to answer your questions during regular business hours (8:30am-5pm).  Please dont hesitate to call and ask to speak to one of our nurses for clinical concerns.   If you have a medical emergency, go to the nearest emergency room or call 911.  A surgeon from [facility] is always on call at the [facility], [place], [place], [place], [place]  [address] ? MAIN: [phone] ? TOLL FREE: [phone] ?  FAX [phone] [email or web address]

## 2015-12-24 NOTE — Progress Notes (Signed)
Pt ready for discharge to home with wife. Pt. Is alert and oriented. Pt is hemodynamically stable. IV (2) removed. AVS reviewed with pt. Capable of re verbalizing medication regimen. Discharge plan appropriate and in place.

## 2015-12-27 ENCOUNTER — Other Ambulatory Visit (HOSPITAL_COMMUNITY): Payer: Self-pay | Admitting: Oncology

## 2015-12-27 ENCOUNTER — Encounter (HOSPITAL_COMMUNITY): Payer: Medicare Other

## 2015-12-27 DIAGNOSIS — C50929 Malignant neoplasm of unspecified site of unspecified male breast: Secondary | ICD-10-CM | POA: Diagnosis not present

## 2015-12-27 DIAGNOSIS — Z8041 Family history of malignant neoplasm of ovary: Secondary | ICD-10-CM | POA: Diagnosis not present

## 2015-12-27 DIAGNOSIS — Z803 Family history of malignant neoplasm of breast: Secondary | ICD-10-CM | POA: Diagnosis not present

## 2015-12-27 DIAGNOSIS — Z7901 Long term (current) use of anticoagulants: Secondary | ICD-10-CM | POA: Diagnosis not present

## 2015-12-27 DIAGNOSIS — I6522 Occlusion and stenosis of left carotid artery: Secondary | ICD-10-CM

## 2015-12-27 DIAGNOSIS — C50919 Malignant neoplasm of unspecified site of unspecified female breast: Secondary | ICD-10-CM

## 2015-12-27 DIAGNOSIS — Z951 Presence of aortocoronary bypass graft: Secondary | ICD-10-CM | POA: Diagnosis not present

## 2015-12-27 DIAGNOSIS — C8190 Hodgkin lymphoma, unspecified, unspecified site: Secondary | ICD-10-CM | POA: Diagnosis not present

## 2015-12-27 LAB — PROTIME-INR
INR: 1.29 (ref 0.00–1.49)
PROTHROMBIN TIME: 16.2 s — AB (ref 11.6–15.2)

## 2015-12-28 NOTE — Anesthesia Postprocedure Evaluation (Signed)
Anesthesia Post Note  Patient: Ryan [name]  Procedure(s) Performed: Procedure(s) (LRB): APPENDECTOMY LAPAROSCOPIC ([name])  Patient location during evaluation: PACU Anesthesia Type: General Level of consciousness: awake and alert and patient cooperative Pain management: pain level controlled Vital Signs Assessment: post-procedure vital signs reviewed and stable Respiratory status: spontaneous breathing and respiratory function stable Cardiovascular status: stable Anesthetic complications: no    Last Vitals:  Filed Vitals:   12/23/15 2100 12/24/15 0556  BP: 132/65 111/67  Pulse: 99 84  Temp: 36.8 C 36.8 C  Resp: 19 19    Last Pain:  Filed Vitals:   12/24/15 0746  PainSc: 0-No pain                 Timi Reeser S

## 2015-12-31 ENCOUNTER — Encounter (HOSPITAL_COMMUNITY): Payer: Medicare Other

## 2015-12-31 ENCOUNTER — Other Ambulatory Visit (HOSPITAL_COMMUNITY): Payer: Self-pay | Admitting: Oncology

## 2015-12-31 DIAGNOSIS — C50929 Malignant neoplasm of unspecified site of unspecified male breast: Secondary | ICD-10-CM | POA: Diagnosis not present

## 2015-12-31 DIAGNOSIS — Z951 Presence of aortocoronary bypass graft: Secondary | ICD-10-CM | POA: Diagnosis not present

## 2015-12-31 DIAGNOSIS — C8190 Hodgkin lymphoma, unspecified, unspecified site: Secondary | ICD-10-CM | POA: Diagnosis not present

## 2015-12-31 DIAGNOSIS — Z1389 Encounter for screening for other disorder: Secondary | ICD-10-CM | POA: Diagnosis not present

## 2015-12-31 DIAGNOSIS — N182 Chronic kidney disease, stage 2 (mild): Secondary | ICD-10-CM | POA: Diagnosis not present

## 2015-12-31 DIAGNOSIS — Z6830 Body mass index (BMI) 30.0-30.9, adult: Secondary | ICD-10-CM | POA: Diagnosis not present

## 2015-12-31 DIAGNOSIS — C50919 Malignant neoplasm of unspecified site of unspecified female breast: Secondary | ICD-10-CM

## 2015-12-31 DIAGNOSIS — Z803 Family history of malignant neoplasm of breast: Secondary | ICD-10-CM | POA: Diagnosis not present

## 2015-12-31 DIAGNOSIS — E782 Mixed hyperlipidemia: Secondary | ICD-10-CM | POA: Diagnosis not present

## 2015-12-31 DIAGNOSIS — K37 Unspecified appendicitis: Secondary | ICD-10-CM | POA: Diagnosis not present

## 2015-12-31 DIAGNOSIS — I6522 Occlusion and stenosis of left carotid artery: Secondary | ICD-10-CM

## 2015-12-31 DIAGNOSIS — Z7901 Long term (current) use of anticoagulants: Secondary | ICD-10-CM | POA: Diagnosis not present

## 2015-12-31 DIAGNOSIS — Z8041 Family history of malignant neoplasm of ovary: Secondary | ICD-10-CM | POA: Diagnosis not present

## 2015-12-31 LAB — PROTIME-INR
INR: 1.58 — ABNORMAL HIGH (ref 0.00–1.49)
Prothrombin Time: 18.9 seconds — ABNORMAL HIGH (ref 11.6–15.2)

## 2016-01-03 ENCOUNTER — Other Ambulatory Visit (HOSPITAL_COMMUNITY): Payer: Self-pay | Admitting: Emergency Medicine

## 2016-01-03 DIAGNOSIS — Z7901 Long term (current) use of anticoagulants: Secondary | ICD-10-CM

## 2016-01-05 ENCOUNTER — Encounter (HOSPITAL_COMMUNITY): Payer: Medicare Other

## 2016-01-05 ENCOUNTER — Other Ambulatory Visit (HOSPITAL_COMMUNITY): Payer: Self-pay | Admitting: Emergency Medicine

## 2016-01-05 DIAGNOSIS — C50929 Malignant neoplasm of unspecified site of unspecified male breast: Secondary | ICD-10-CM | POA: Diagnosis not present

## 2016-01-05 DIAGNOSIS — Z951 Presence of aortocoronary bypass graft: Secondary | ICD-10-CM | POA: Diagnosis not present

## 2016-01-05 DIAGNOSIS — Z803 Family history of malignant neoplasm of breast: Secondary | ICD-10-CM | POA: Diagnosis not present

## 2016-01-05 DIAGNOSIS — Z8041 Family history of malignant neoplasm of ovary: Secondary | ICD-10-CM | POA: Diagnosis not present

## 2016-01-05 DIAGNOSIS — Z7901 Long term (current) use of anticoagulants: Secondary | ICD-10-CM

## 2016-01-05 DIAGNOSIS — C8190 Hodgkin lymphoma, unspecified, unspecified site: Secondary | ICD-10-CM | POA: Diagnosis not present

## 2016-01-05 LAB — PROTIME-INR
INR: 2.22 — AB (ref 0.00–1.49)
PROTHROMBIN TIME: 24.4 s — AB (ref 11.6–15.2)

## 2016-01-12 ENCOUNTER — Encounter (HOSPITAL_COMMUNITY): Payer: Medicare Other | Attending: Internal Medicine

## 2016-01-12 DIAGNOSIS — Z951 Presence of aortocoronary bypass graft: Secondary | ICD-10-CM | POA: Diagnosis not present

## 2016-01-12 DIAGNOSIS — Z803 Family history of malignant neoplasm of breast: Secondary | ICD-10-CM | POA: Diagnosis not present

## 2016-01-12 DIAGNOSIS — Z8041 Family history of malignant neoplasm of ovary: Secondary | ICD-10-CM | POA: Insufficient documentation

## 2016-01-12 DIAGNOSIS — C8190 Hodgkin lymphoma, unspecified, unspecified site: Secondary | ICD-10-CM | POA: Insufficient documentation

## 2016-01-12 DIAGNOSIS — Z7901 Long term (current) use of anticoagulants: Secondary | ICD-10-CM | POA: Insufficient documentation

## 2016-01-12 DIAGNOSIS — C50929 Malignant neoplasm of unspecified site of unspecified male breast: Secondary | ICD-10-CM | POA: Insufficient documentation

## 2016-01-12 LAB — PROTIME-INR
INR: 2.41 — AB (ref 0.00–1.49)
Prothrombin Time: 26 seconds — ABNORMAL HIGH (ref 11.6–15.2)

## 2016-01-26 ENCOUNTER — Other Ambulatory Visit (HOSPITAL_COMMUNITY): Payer: Self-pay | Admitting: Oncology

## 2016-01-26 ENCOUNTER — Other Ambulatory Visit (HOSPITAL_COMMUNITY): Payer: Self-pay | Admitting: Emergency Medicine

## 2016-01-26 ENCOUNTER — Encounter (HOSPITAL_COMMUNITY): Payer: Medicare Other

## 2016-01-26 DIAGNOSIS — Z7901 Long term (current) use of anticoagulants: Secondary | ICD-10-CM | POA: Diagnosis not present

## 2016-01-26 DIAGNOSIS — Z803 Family history of malignant neoplasm of breast: Secondary | ICD-10-CM | POA: Diagnosis not present

## 2016-01-26 DIAGNOSIS — C50929 Malignant neoplasm of unspecified site of unspecified male breast: Secondary | ICD-10-CM | POA: Diagnosis not present

## 2016-01-26 DIAGNOSIS — Z8041 Family history of malignant neoplasm of ovary: Secondary | ICD-10-CM | POA: Diagnosis not present

## 2016-01-26 DIAGNOSIS — C8190 Hodgkin lymphoma, unspecified, unspecified site: Secondary | ICD-10-CM | POA: Diagnosis not present

## 2016-01-26 DIAGNOSIS — Z951 Presence of aortocoronary bypass graft: Secondary | ICD-10-CM | POA: Diagnosis not present

## 2016-01-26 LAB — PROTIME-INR
INR: 1.98 — ABNORMAL HIGH (ref 0.00–1.49)
Prothrombin Time: 22.4 seconds — ABNORMAL HIGH (ref 11.6–15.2)

## 2016-01-28 ENCOUNTER — Ambulatory Visit (HOSPITAL_COMMUNITY): Payer: Medicare Other | Admitting: Oncology

## 2016-01-28 ENCOUNTER — Encounter (HOSPITAL_BASED_OUTPATIENT_CLINIC_OR_DEPARTMENT_OTHER): Payer: Medicare Other | Admitting: Hematology & Oncology

## 2016-01-28 ENCOUNTER — Encounter (HOSPITAL_COMMUNITY): Payer: Self-pay | Admitting: Hematology & Oncology

## 2016-01-28 VITALS — BP 115/68 | HR 79 | Temp 98.8°F | Resp 18 | Wt 246.6 lb

## 2016-01-28 DIAGNOSIS — C50919 Malignant neoplasm of unspecified site of unspecified female breast: Secondary | ICD-10-CM

## 2016-01-28 DIAGNOSIS — C50821 Malignant neoplasm of overlapping sites of right male breast: Secondary | ICD-10-CM

## 2016-01-28 DIAGNOSIS — Z7901 Long term (current) use of anticoagulants: Secondary | ICD-10-CM

## 2016-01-28 DIAGNOSIS — Z1589 Genetic susceptibility to other disease: Secondary | ICD-10-CM

## 2016-01-28 DIAGNOSIS — C8190 Hodgkin lymphoma, unspecified, unspecified site: Secondary | ICD-10-CM

## 2016-01-28 DIAGNOSIS — Z8673 Personal history of transient ischemic attack (TIA), and cerebral infarction without residual deficits: Secondary | ICD-10-CM

## 2016-01-28 DIAGNOSIS — Z1509 Genetic susceptibility to other malignant neoplasm: Secondary | ICD-10-CM

## 2016-01-28 DIAGNOSIS — Z8571 Personal history of Hodgkin lymphoma: Secondary | ICD-10-CM

## 2016-01-28 DIAGNOSIS — C50911 Malignant neoplasm of unspecified site of right female breast: Secondary | ICD-10-CM

## 2016-01-28 DIAGNOSIS — Z1502 Genetic susceptibility to malignant neoplasm of ovary: Secondary | ICD-10-CM

## 2016-01-28 NOTE — Progress Notes (Signed)
Ryan [name] at Ryan [name]., MD Ryan [name] [id number]  Stage II cancer the right breast, grade 1, without LV I. ER +96%, PR 4%, Ki-67 marker low at 19%, HER-2/neu nonamplified. Oncotype score was 14. He was placed on tamoxifen 10 mg twice a day which he will take for 5 full years starting 03/08/2011. He is status post right mastectomy in April 2012 at which time he was found to have a 2.1 cm primary, 3 sentinel nodes were negative.  12/14/2010  Initial Diagnosis  Right needle core biopsy at 9 o'clock position positive for invasive ductal carcinoma  01/17/2011  Surgery  Right simple mastectomy showing a 2.1 cm invasive ductal carcinoma, clear marhins, 0/2 lymph nodes. ER 96%, PR 84%, Ki-67 19%, Her2 negative.  03/08/2011 -  Chemotherapy  Tamoxifen x 5 years   Ryan [name] disease-1983  04/27/2011  Initial Diagnosis  Ryan [name] disease-1985    DIAGNOSIS: Invasive ductal carcinoma of right breast, stage 2- 2012 s/p chemo and mastectomy   Staging form: Breast, AJCC 7th Edition     Clinical: Stage IIA (T2, N0, cM0) - Signed by Baird Cancer, PA on 04/27/2011   SUMMARY OF ONCOLOGIC HISTORY:   Invasive ductal carcinoma of right breast, stage 2- 2012 s/p chemo and mastectomy   12/14/2010 Initial Diagnosis Right needle core biopsy at 9 o'clock position positive for invasive ductal carcinoma   01/17/2011 Surgery Right simple mastectomy showing a 2.1 cm invasive ductal carcinoma, clear marhins, 0/2 lymph nodes.  ER 96%, PR 84%, Ki-67 19%, Her2 negative.   03/08/2011 -  Chemotherapy Tamoxifen x 5 years   08/09/2015 Pathology Results [facility]- Low risk of late recurrence years 5-10 (3.7%), low likelihood of benefit from extended endocrine therapy, low risk fo overall recurrence years 0-10 (6.6%), and low liklihood of benefit from extended endocrine therapy.    Ryan [name] [id number]   04/27/2011 Initial Diagnosis Ryan [name]  [id number]    CURRENT THERAPY: Tamoxifen  INTERVAL HISTORY: Ryan [name] 65 y.o. male returns for follow-up of a stage II ER positive, HER-2 negative carcinoma of the right breast. He underwent mastectomy and is currently on tamoxifen. She has a history of Hodgkin lymphoma treated back in the early 1980s. He has carotid disease and coronary artery disease felt to be secondary to his radiation therapy from his Hodgkin disease.  He is on Coumadin and notes that he was previously on Aggrenox. Aggrenox was started secondary to his carotid artery disease. He states Dr.Neijstrom stopped his Aggrenox because of an interaction between it and tamoxifen.   On review of his paper chart, he was started on coumadin given his history of right carotid artery occlusion with a small CVA in 2007. Note he also has a partial occlusion of the left internal carotid at 50%. He was seen by Dr. love, neurologist in [place] who recommended Coumadin therapy while on tamoxifen given its procoagulant effects. Recent carotid duplex study performed on 12/10/2014 showed 3% bilateral carotid artery stenosis.  Ryan [name] returns to the [place] today accompanied by his wife.  He recently had an appendectomy (12/23/2015). Final pathology was unremarkable for malignancy. He will  complete his Tamoxifen treatment in May. His wife notes that he has twenty pills left in the bottle. He notes that the Coumadin was way worse to take than the Tamoxifen,noting the inconvenience of Tamoxifen.  He notes that he hasn't talked to his stroke doctor about what he will need to do  once he's off of the Tamoxifen. His wife notes that he isn't seeing a neurologist since Dr. Erling Cruz retired. Dr. Ellyn Hack, cardiologist, is following his carotid. Dr. Scot Dock does the carotid ultrasounds. He also sees Vinnie Level Nickel. The group he sees is [facility] (verify).  His mammogram is good and he is not due again until  November. No other major complaints or concerns today.   MEDICAL HISTORY: Past Medical History  Diagnosis Date  . Right carotid artery occlusion 04/27/2011  . Left carotid artery partial occlusion 04/27/2011    CAROTIC DOPPLER 11/2013: < 40% R ICA stenosis, distal waveforms damped - suggests probably intracranial occlusoin, < 62% LICA, normal vertebrals -- no change  . Stroke [facility])     2007  . Hypercholesterolemia   . Adie's pupil   . Thyroid disease   . S/P CABG x 3 04/09/2013    LIMA-LAD, fLRAD-RI, SVG-RCA; Echo 10/29/'15:  mild Conc LVH, no WMA, Gr 1 DD, Mod AoV Sclerosis w/ Mod AI, ~Mild-Mod MS   . CAD (coronary artery disease), native coronary artery 04/09/2013    Dist LM stenosis 70-80% (at trifurcation into LAD, CX, & 2 Ramus branches, 70% mid RCA);; b) Myoview 08/06/14: Low Risk, no Ischemia/infarction  . Essential hypertension   . Mild mitral stenosis by prior echocardiogram 08/06/2014     Noted on echocardiogram  . Anemia 07/14/2013  . Hypothyroidism   . Restless leg syndrome   . Exertional shortness of breath   . Borderline diabetes   . Invasive ductal carcinoma of breast, stage 2 ([place]) 04/27/2011    chemo, mastectomy  . Ryan [name] lymphoma ([place]) 1983  . Depression 1983    "after I was dx'd w/CA; now just comes in spells"  (12/22/2015)    has Invasive ductal carcinoma of right breast, stage 2- 2012 s/p chemo and mastectomy; Ryan [name] [id number]; Known RICA occlusion (CVA '07) partial LICA stenosis (41-74%) Jan 2014; Angina, class III - cath 04/07/13- 3V CAD; Hyperlipidemia with target LDL less than 70; Heart palpitations; Claudication of calf muscles (HCC); S/P CABG x 3  LIMA-LAD, free LRA to RI, SVG-RCA  - 04/09/13; Chronic anticoagulation- Coumadin; Obesity (BMI 30-39.9); CAD in native artery - status post CABG x3 (LIMA-LAD, Left Radial-Ramus Intermedius, SVG-RCA; Dizzy spells; Orthostatic hypotension; Anemia; Coronary atherosclerosis of native coronary artery - multivessel CAD,  status post CABG x3 (LIMA-LAD, Left Radial-RI, SVG-RCA); Fatigue; Occlusion and stenosis of carotid artery without mention of cerebral infarction; Known RICA occlusion (CVA '07), partial LICA stenosis (08-14%); Exertional dyspnea; Essential hypertension; Cancer ([place]); Malignant neoplasm of breast associated with mutation in CHEK2 gene ([place]); Genetic testing; and Acute appendicitis on his problem list.     has No Known Allergies.  Mr. Boomershine does not currently have medications on file.  SURGICAL HISTORY: Past Surgical History  Procedure Laterality Date  . Lymph node biopsy Left 1983    neck  . Intraoperative transesophageal echocardiogram [name] 04/08/2013    Procedure: INTRAOPERATIVE TRANSESOPHAGEAL ECHOCARDIOGRAM;  Surgeon: Ivin Poot, MD;  Location: [facility];  Service: Open Heart Surgery;  Laterality: [name];  . Radial artery harvest Left 04/08/2013    Procedure: RADIAL ARTERY HARVEST;  Surgeon: Ivin Poot, MD;  Location: [place];  Service: Open Heart Surgery;  Laterality: Left;  . Nm treadmill myoview ltd  08/06/2014    Exercise 5:07 min, 6.3 METS; symptoms noted or extreme dyspnea and lightheadedness with some chest tightness. No EKG changes. No ischemia or infarction was normal EF.  Marland Kitchen  Transthoracic echocardiogram  08/06/2014     Normal EF 55-60%. Gr 1 DD - increased LVEDP. Moderate aortic valve thickening suggestive of AoV Sclerosis but not stenosis. Mild AI; MIld MS - mean gradient of 7 mmHg & HR of 122 bpm.  . Left heart catheterization with coronary angiogram [name] 04/07/2013    Procedure: LEFT HEART CATHETERIZATION WITH CORONARY ANGIOGRAM;  Surgeon: Leonie Man, MD;  Location: [facility] CATH LAB;  Service: Cardiovascular;  Laterality: [name];  . Colonoscopy [name] 05/06/2015    Procedure: COLONOSCOPY;  Surgeon: Rogene Houston, MD;  [facility];  Service: Endoscopy;  Laterality: [name];  930  . Cataract extraction w/phaco Right 06/21/2015    Procedure: CATARACT EXTRACTION PHACO AND  INTRAOCULAR LENS PLACEMENT (IOC);  Surgeon: Tonny Branch, MD;  Location: [facility] OR[facility];  Service: Ophthalmology;  Laterality: Right;  CDE: 7.05  . Cataract extraction w/phaco Left 07/01/2015    Procedure: CATARACT EXTRACTION PHACO AND INTRAOCULAR LENS PLACEMENT (IOC);  Surgeon: Tonny Branch, MD;  Location: [facility];  Service: Ophthalmology;  Laterality: Left;  CDE: 7.49  . Breast biopsy Right   . Mastectomy Right     w/axillary lymph node dissection  . Coronary artery bypass graft [name] 04/08/2013    Procedure: CORONARY ARTERY BYPASS GRAFTING (CABG);  Surgeon: Ivin Poot, MD;  Location: [place];  Service: Open Heart Surgery;  Laterality: [name];  Times 3 using left internal mammary artery; endoscopically harvested right saphenous vein and left radial artery  . Tonsillectomy  1974  . Cardiac catheterization  2014  . Laparoscopic appendectomy [name] 12/23/2015    Procedure: APPENDECTOMY LAPAROSCOPIC;  Surgeon: Donnie Mesa, MD;  Location: [facility];  Service: General;  Laterality: [name];    SOCIAL HISTORY: Social History   Social History  . Marital Status: Married    Spouse Name: [name]  . Number of Children: 2  . Years of Education: [name]   Occupational History  . Not on file.   Social History Main Topics  . Smoking status: Former Smoker -- 1.00 packs/day for 15 years    Types: Cigarettes    Quit date: 06/09/1982  . Smokeless tobacco: Never Used  . Alcohol Use: No  . Drug Use: No  . Sexual Activity: No   Other Topics Concern  . Not on file   Social History Narrative   Married. Former smoker who quit in 1983.   No routine exercise - gained ~40lb post CABG.    FAMILY HISTORY: Family History  Problem Relation Age of Onset  . Alzheimer's disease Mother   . Stroke Mother   . Hypertension Mother   . Hyperlipidemia Mother   . Diabetes Mother   . Hypertension Father   . Diabetes Paternal Grandmother   . Diabetes Paternal Grandfather   . Breast cancer Paternal Aunt   . Ovarian cancer Paternal Aunt      Review of Systems  Constitutional: Negative for fever, chills, weight loss and malaise/fatigue.  HENT: Negative for congestion, hearing loss, nosebleeds, sore throat and tinnitus.   Eyes: Negative for blurred vision, double vision, pain and discharge.  Respiratory: Negative for cough, hemoptysis, sputum production, POSITIVE for SOB  Cardiovascular: Negative for chest pain, palpitations, claudication, leg swelling and PND.  Gastrointestinal: Negative for heartburn, nausea, vomiting, abdominal pain, diarrhea, constipation, blood in stool and melena.  Genitourinary: Negative for dysuria, urgency, frequency and hematuria.  Musculoskeletal: Negative for myalgias, joint pain and falls.  Skin: Negative for itching and rash.  Neurological: Negative for dizziness, tingling,  tremors, sensory change, speech change, focal weakness, seizures, loss of consciousness, weakness and headaches.  Endo/Heme/Allergies: Does not bruise/bleed easily.  Psychiatric/Behavioral: Negative for depression, suicidal ideas, memory loss and substance abuse. The patient is not nervous/anxious and does not have insomnia.    14 point review of systems was performed and is negative except as detailed under history of present illness and above  PHYSICAL EXAMINATION  ECOG PERFORMANCE STATUS: 0 - Asymptomatic  Filed Vitals:   01/28/16 1116  BP: 115/68  Pulse: 79  Temp: 98.8 F (37.1 C)  Resp: 18    Physical Exam  Constitutional: He is oriented to person, place, and time and well-developed, well-nourished, and in no distress.  HENT:  Head: Normocephalic and atraumatic.  Nose: Nose normal.  Mouth/Throat: Oropharynx is clear and moist. No oropharyngeal exudate.  Eyes: Conjunctivae and EOM are normal. Pupils are equal, round, and reactive to light. Right eye exhibits no discharge. Left eye exhibits no discharge. No scleral icterus.  Neck: Normal range of motion. Neck supple. No tracheal deviation present. No  thyromegaly present.  Cardiovascular: Normal rate, regular rhythm and normal heart sounds.  Exam reveals no gallop and no friction rub.   No murmur heard. Pulmonary/Chest: Effort normal and breath sounds normal. He has no wheezes. He has no rales.    R mastectomy site looks great; well-healed. Abdominal: Soft. Bowel sounds are normal. He exhibits no distension and no mass. There is no tenderness. There is no rebound and no guarding.  Musculoskeletal: Normal range of motion. He exhibits no edema.  Lymphadenopathy:    He has no cervical adenopathy.  Neurological: He is alert and oriented to person, place, and time. He has normal reflexes. No cranial nerve deficit. Gait normal. Coordination normal.  Skin: Skin is warm and dry. No rash noted.  Psychiatric: Mood, memory, affect and judgment normal.  Nursing note and vitals reviewed.   LABORATORY DATA: I have reviewed the data as listed.  CBC    Component Value Date/Time   WBC 8.8 12/23/2015 0448   RBC 4.14* 12/23/2015 0448   HGB 12.0* 12/23/2015 0448   HCT 37.8* 12/23/2015 0448   PLT 162 12/23/2015 0448   MCV 91.3 12/23/2015 0448   MCH 29.0 12/23/2015 0448   MCHC 31.7 12/23/2015 0448   RDW 13.9 12/23/2015 0448   LYMPHSABS 2.2 12/23/2015 0448   MONOABS 0.7 12/23/2015 0448   EOSABS 0.2 12/23/2015 0448   BASOSABS 0.1 12/23/2015 0448   CMP     Component Value Date/Time   NA 141 12/22/2015 1645   K 4.0 12/22/2015 1645   CL 104 12/22/2015 1645   CO2 25 12/22/2015 1645   GLUCOSE 119* 12/22/2015 1645   BUN 14 12/22/2015 1645   CREATININE 1.61* 12/22/2015 1645   CREATININE 1.45* 07/14/2013 1639   CALCIUM 8.8* 12/22/2015 1645   PROT 6.1* 12/22/2015 1645   ALBUMIN 3.5 12/22/2015 1645   AST 23 12/22/2015 1645   ALT 13* 12/22/2015 1645   ALKPHOS 37* 12/22/2015 1645   BILITOT 0.9 12/22/2015 1645   GFRNONAA 43* 12/22/2015 1645   GFRAA 50* 12/22/2015 1645   Results for Ryan [name], Ryan [name] (MRN [id number]) as of 01/28/2016 11:52   Ref. Range 01/26/2016 11:10  Prothrombin Time Latest Ref Range: 11.6-15.2 seconds 22.4 (H)  INR Latest Ref Range: 0.00-1.49  1.98 (H)     RADIOGRAPHIC STUDIES:  CLINICAL DATA: 65 year old male with prior history of right breast cancer in 2012 post mastectomy. Screening exam on the left breast.  EXAM: DIGITAL DIAGNOSTIC LEFT MAMMOGRAM WITH CAD  COMPARISON: Previous exams.  ACR Breast Density Category b: There are scattered areas of fibroglandular density.  FINDINGS: No suspicious masses or calcifications are seen in the left breast. There is no mammographic evidence of malignancy in the left breast.  Mammographic images were processed with CAD.  IMPRESSION: No mammographic evidence of malignancy in the left breast.  RECOMMENDATION: Screening mammogram in one year.(Code:[id number]-B-01Y)  I have discussed the findings and recommendations with the patient. Results were also provided in writing at the conclusion of the visit. If applicable, a reminder letter will be sent to the patient regarding the next appointment.  BI-RADS CATEGORY 1: Negative.   Electronically Signed  By: Everlean Alstrom M.D.  On: 08/18/2014 14:09     ASSESSMENT and THERAPY PLAN:   Stage II invasive ductal carcinoma of the right breast, ER positive HER-2 negative status post mastectomy and tamoxifen therapy. Tamoxifen was initiated in May 2012  CHEK2 mutation  Hodgkin lymphoma treated in the early 80s with mantle radiation Coronary artery disease Carotid arterial disease Ongoing anticoagulant therapy given prior history of CVA and current tamoxifen use  He has almost completed 5 years of tamoxifen. At the conclusion of his therapy he is inquiring whether he can come off of his coumadin. I feel that he may be able to go back on antiplatelet agents such as Plavix, we can work with his cardiologist or refer him to a neurologist in regards to this.  He will be due for a mammogram in  November but we will see him back in October for ongoing surveillance and we will schedule it at that appointment.  His history of significant arterial disease is most likely the result of his prior mantle radiation for his Hodgkin lymphoma. He is currently receiving all appropriate care.  I reviewed the results of his genetic counseling with he and his wife. They did not understand the risk to their daughters. I again reviewed the genetic counselors recommendations. I emphasized to the patient to make sure he undergoes annual prostate screening as well. He has excellent follow-up with his primary care physician.  NOTE: CHEK2 mutations have been found to be associated with an increased risk of breast and other cancers. The estimated cancer risks vary widely and may be influenced by family history. Women with a CHEK2 deleterious mutation have approximately a 24% (no family history of breast cancer) to 48% (strong family history of breast cancer) lifetime risk of breast cancer and up to a 25% risk of a second breast cancer. Men may have an increased risk for male breast cancer of about 1%. Men and women may have an increased risk of colon cancer (~10% lifetime risk). Other cancers, including ovarian, uterine and thyroid, have been associated with CHEK2 mutations, but no specific risk has been established. According to the NCCN guidelines, women with CHEK2 mutations should consider breast MRI's as a part of regular breast cancer screening. There are no other established management guidelines for individuals with disease causing mutations in CHEK2. However, management options based on other genes associated with high risks of breast cancer (such as the BRCA1 and BRCA2) may be appropriate to follow.   Male Breast and Prostate Management Options We reviewed the NCCN practice guidelines (v.2.2015) for male breast and prostate management for men at high risk of male breast and prostate cancer because of BRCA1 or  BRCA2 mutations:  1. Breast self-exam training and education starting at age 47.  2. Clinical breast  exam, every 6-12 months, starting at age 73 years  79. Consider baseline screening mammogram at age 9 with annual mammograms if gynecomastia or parenchymal/glandular breast density on baseline study.  4. Consider prostate cancer screening starting at age 66 years.  I would like to see him one more time in 6 months. If he is doing great at that time, we can move him out to visits once per year.  Orders Placed This Encounter  Procedures  . CBC with Differential    Standing Status: Future     Number of Occurrences:      Standing Expiration Date: 01/27/2017  . Comprehensive metabolic panel    Standing Status: Future     Number of Occurrences:      Standing Expiration Date: 01/27/2017   All questions were answered. The patient knows to call the clinic with any problems, questions or concerns. We can certainly see the patient much sooner if necessary.  This document serves as a record of services personally performed by Ancil Linsey, MD. It was created on her behalf by Toni Amend, a trained medical scribe. The creation of this record is based on the scribe's personal observations and the provider's statements to them. This document has been checked and approved by the attending provider.  I have reviewed the above documentation for accuracy and completeness and I agree with the above.  This note was electronically signed. Molli Hazard, MD 01/28/2016

## 2016-01-28 NOTE — Patient Instructions (Addendum)
[place] at [facility] Discharge Instructions  RECOMMENDATIONS MADE BY THE CONSULTANT AND ANY TEST RESULTS WILL BE SENT TO YOUR REFERRING PHYSICIAN.     Exam and discussion by Dr Whitney Muse today Mammogram in October, you should receive a letter to schedule  Return to see the doctor in 6 months Stop tamoxifen after this bottle you have  Dr Whitney Muse is going to send a few notes through [facility], we will let you know about your blood thinner Please call the clinic if you have any questions or concerns      Thank you for choosing [place] at [facility] to provide your oncology and hematology care.  To afford each patient quality time with our provider, please arrive at least 15 minutes before your scheduled appointment time.   Beginning January 23rd 2017 lab work for the [facility] will be done in the  Main lab at [facility] on 1st floor. If you have a lab appointment with the [facility] please come in thru the  Main Entrance and check in at the main information desk  You need to re-schedule your appointment should you arrive 10 or more minutes late.  We strive to give you quality time with our providers, and arriving late affects you and other patients whose appointments are after yours.  Also, if you no show three or more times for appointments you may be dismissed from the clinic at the providers discretion.     Again, thank you for choosing [facility]  Our hope is that these requests will decrease the amount of time that you wait before being seen by our physicians.       _____________________________________________________________  Should you have questions after your visit to [facility], please contact our office at [phone] between the hours of 8:30 a.m. and 4:30 p.m.  Voicemails left after 4:30 p.m. will not be returned until the following business day.  For prescription refill requests, have your  pharmacy contact our office.         Resources For Cancer Patients and their Caregivers ? American Cancer Society: Can assist with transportation, wigs, general needs, runs Look Good Feel Better.        562 511 4440 ? Cancer Care: Provides financial assistance, online support groups, medication/co-pay assistance.  1-800-813-HOPE 539-271-6697) ? [employer] Assists [place] cancer patients and their families through emotional , educational and financial support.  318-113-9924 ? [place] DSS Where to apply for food stamps, Medicaid and utility assistance. 938-422-5679 ? [facility]: Transportation to medical appointments. 281-877-9162 ? Social Security Administration: May apply for disability if have a Stage IV cancer. (913)226-8010 939-207-0208 ? [employer], Disability and Transit Services: Assists with nutrition, care and transit needs. 217-437-4951

## 2016-01-30 ENCOUNTER — Encounter (HOSPITAL_COMMUNITY): Payer: Self-pay | Admitting: Hematology & Oncology

## 2016-02-10 ENCOUNTER — Other Ambulatory Visit (HOSPITAL_COMMUNITY): Payer: Self-pay | Admitting: Emergency Medicine

## 2016-02-10 ENCOUNTER — Encounter (HOSPITAL_COMMUNITY): Payer: Medicare Other | Attending: Internal Medicine

## 2016-02-10 DIAGNOSIS — C50929 Malignant neoplasm of unspecified site of unspecified male breast: Secondary | ICD-10-CM | POA: Insufficient documentation

## 2016-02-10 DIAGNOSIS — Z951 Presence of aortocoronary bypass graft: Secondary | ICD-10-CM | POA: Diagnosis not present

## 2016-02-10 DIAGNOSIS — Z803 Family history of malignant neoplasm of breast: Secondary | ICD-10-CM | POA: Diagnosis not present

## 2016-02-10 DIAGNOSIS — Z7901 Long term (current) use of anticoagulants: Secondary | ICD-10-CM | POA: Insufficient documentation

## 2016-02-10 DIAGNOSIS — Z8041 Family history of malignant neoplasm of ovary: Secondary | ICD-10-CM | POA: Insufficient documentation

## 2016-02-10 DIAGNOSIS — C8190 Hodgkin lymphoma, unspecified, unspecified site: Secondary | ICD-10-CM | POA: Insufficient documentation

## 2016-02-10 LAB — PROTIME-INR
INR: 2.56 — AB (ref 0.00–1.49)
Prothrombin Time: 27.2 seconds — ABNORMAL HIGH (ref 11.6–15.2)

## 2016-02-21 DIAGNOSIS — Z1389 Encounter for screening for other disorder: Secondary | ICD-10-CM | POA: Diagnosis not present

## 2016-02-21 DIAGNOSIS — Z0000 Encounter for general adult medical examination without abnormal findings: Secondary | ICD-10-CM | POA: Diagnosis not present

## 2016-02-21 DIAGNOSIS — E6609 Other obesity due to excess calories: Secondary | ICD-10-CM | POA: Diagnosis not present

## 2016-02-21 DIAGNOSIS — Z6830 Body mass index (BMI) 30.0-30.9, adult: Secondary | ICD-10-CM | POA: Diagnosis not present

## 2016-03-02 ENCOUNTER — Encounter (HOSPITAL_COMMUNITY): Payer: Medicare Other

## 2016-03-02 ENCOUNTER — Other Ambulatory Visit (HOSPITAL_COMMUNITY): Payer: Self-pay | Admitting: Emergency Medicine

## 2016-03-02 DIAGNOSIS — C8190 Hodgkin lymphoma, unspecified, unspecified site: Secondary | ICD-10-CM | POA: Diagnosis not present

## 2016-03-02 DIAGNOSIS — Z951 Presence of aortocoronary bypass graft: Secondary | ICD-10-CM | POA: Diagnosis not present

## 2016-03-02 DIAGNOSIS — C50929 Malignant neoplasm of unspecified site of unspecified male breast: Secondary | ICD-10-CM | POA: Diagnosis not present

## 2016-03-02 DIAGNOSIS — Z803 Family history of malignant neoplasm of breast: Secondary | ICD-10-CM | POA: Diagnosis not present

## 2016-03-02 DIAGNOSIS — Z7901 Long term (current) use of anticoagulants: Secondary | ICD-10-CM | POA: Diagnosis not present

## 2016-03-02 DIAGNOSIS — Z8041 Family history of malignant neoplasm of ovary: Secondary | ICD-10-CM | POA: Diagnosis not present

## 2016-03-02 LAB — PROTIME-INR
INR: 2.59 — ABNORMAL HIGH (ref 0.00–1.49)
PROTHROMBIN TIME: 27.4 s — AB (ref 11.6–15.2)

## 2016-03-04 ENCOUNTER — Other Ambulatory Visit (HOSPITAL_COMMUNITY): Payer: Self-pay | Admitting: Oncology

## 2016-03-09 DIAGNOSIS — Z6830 Body mass index (BMI) 30.0-30.9, adult: Secondary | ICD-10-CM | POA: Diagnosis not present

## 2016-03-09 DIAGNOSIS — J209 Acute bronchitis, unspecified: Secondary | ICD-10-CM | POA: Diagnosis not present

## 2016-03-09 DIAGNOSIS — Z1389 Encounter for screening for other disorder: Secondary | ICD-10-CM | POA: Diagnosis not present

## 2016-03-15 ENCOUNTER — Telehealth (HOSPITAL_COMMUNITY): Payer: Self-pay | Admitting: *Deleted

## 2016-03-15 ENCOUNTER — Other Ambulatory Visit (HOSPITAL_COMMUNITY): Payer: Self-pay | Admitting: Pulmonary Disease

## 2016-03-15 ENCOUNTER — Ambulatory Visit (HOSPITAL_COMMUNITY)
Admission: RE | Admit: 2016-03-15 | Discharge: 2016-03-15 | Disposition: A | Discharge status: Home / Self Care | Payer: Medicare Other | Source: Ambulatory Visit | Attending: Pulmonary Disease | Admitting: Pulmonary Disease

## 2016-03-15 DIAGNOSIS — J690 Pneumonitis due to inhalation of food and vomit: Secondary | ICD-10-CM

## 2016-03-15 DIAGNOSIS — R079 Chest pain, unspecified: Secondary | ICD-10-CM | POA: Diagnosis not present

## 2016-03-15 DIAGNOSIS — J209 Acute bronchitis, unspecified: Secondary | ICD-10-CM | POA: Insufficient documentation

## 2016-03-15 DIAGNOSIS — I10 Essential (primary) hypertension: Secondary | ICD-10-CM | POA: Diagnosis not present

## 2016-03-15 DIAGNOSIS — R05 Cough: Secondary | ICD-10-CM | POA: Diagnosis not present

## 2016-03-17 ENCOUNTER — Encounter (HOSPITAL_COMMUNITY): Payer: Medicare Other | Attending: Internal Medicine

## 2016-03-17 DIAGNOSIS — C50929 Malignant neoplasm of unspecified site of unspecified male breast: Secondary | ICD-10-CM | POA: Insufficient documentation

## 2016-03-17 DIAGNOSIS — Z951 Presence of aortocoronary bypass graft: Secondary | ICD-10-CM | POA: Insufficient documentation

## 2016-03-17 DIAGNOSIS — Z7901 Long term (current) use of anticoagulants: Secondary | ICD-10-CM | POA: Diagnosis not present

## 2016-03-17 DIAGNOSIS — Z803 Family history of malignant neoplasm of breast: Secondary | ICD-10-CM | POA: Insufficient documentation

## 2016-03-17 DIAGNOSIS — Z8041 Family history of malignant neoplasm of ovary: Secondary | ICD-10-CM | POA: Diagnosis not present

## 2016-03-17 DIAGNOSIS — C8190 Hodgkin lymphoma, unspecified, unspecified site: Secondary | ICD-10-CM | POA: Diagnosis not present

## 2016-03-17 LAB — PROTIME-INR
INR: 1.63 — AB (ref 0.00–1.49)
PROTHROMBIN TIME: 19.3 s — AB (ref 11.6–15.2)

## 2016-04-03 ENCOUNTER — Other Ambulatory Visit (HOSPITAL_COMMUNITY): Payer: Medicare Other

## 2016-05-26 DIAGNOSIS — J209 Acute bronchitis, unspecified: Secondary | ICD-10-CM | POA: Diagnosis not present

## 2016-05-26 DIAGNOSIS — J700 Acute pulmonary manifestations due to radiation: Secondary | ICD-10-CM | POA: Diagnosis not present

## 2016-05-26 DIAGNOSIS — I10 Essential (primary) hypertension: Secondary | ICD-10-CM | POA: Diagnosis not present

## 2016-07-17 ENCOUNTER — Other Ambulatory Visit (HOSPITAL_COMMUNITY): Payer: Self-pay | Admitting: Internal Medicine

## 2016-07-17 DIAGNOSIS — Z1231 Encounter for screening mammogram for malignant neoplasm of breast: Secondary | ICD-10-CM

## 2016-07-24 DIAGNOSIS — Z23 Encounter for immunization: Secondary | ICD-10-CM | POA: Diagnosis not present

## 2016-07-28 ENCOUNTER — Encounter (HOSPITAL_COMMUNITY): Payer: Medicare Other

## 2016-07-28 ENCOUNTER — Encounter (HOSPITAL_COMMUNITY): Payer: Medicare Other | Attending: Oncology | Admitting: Oncology

## 2016-07-28 ENCOUNTER — Encounter (HOSPITAL_COMMUNITY): Payer: Self-pay | Admitting: Oncology

## 2016-07-28 VITALS — BP 131/81 | HR 94 | Temp 97.7°F | Resp 18 | Wt 251.0 lb

## 2016-07-28 DIAGNOSIS — Z9889 Other specified postprocedural states: Secondary | ICD-10-CM | POA: Diagnosis not present

## 2016-07-28 DIAGNOSIS — Z803 Family history of malignant neoplasm of breast: Secondary | ICD-10-CM | POA: Insufficient documentation

## 2016-07-28 DIAGNOSIS — Z9221 Personal history of antineoplastic chemotherapy: Secondary | ICD-10-CM | POA: Diagnosis not present

## 2016-07-28 DIAGNOSIS — R0609 Other forms of dyspnea: Secondary | ICD-10-CM

## 2016-07-28 DIAGNOSIS — Z9011 Acquired absence of right breast and nipple: Secondary | ICD-10-CM | POA: Diagnosis not present

## 2016-07-28 DIAGNOSIS — Z8571 Personal history of Hodgkin lymphoma: Secondary | ICD-10-CM | POA: Diagnosis not present

## 2016-07-28 DIAGNOSIS — C50911 Malignant neoplasm of unspecified site of right female breast: Secondary | ICD-10-CM

## 2016-07-28 DIAGNOSIS — D0511 Intraductal carcinoma in situ of right breast: Secondary | ICD-10-CM | POA: Diagnosis not present

## 2016-07-28 DIAGNOSIS — Z823 Family history of stroke: Secondary | ICD-10-CM | POA: Diagnosis not present

## 2016-07-28 DIAGNOSIS — Z8041 Family history of malignant neoplasm of ovary: Secondary | ICD-10-CM | POA: Insufficient documentation

## 2016-07-28 DIAGNOSIS — Z87891 Personal history of nicotine dependence: Secondary | ICD-10-CM | POA: Insufficient documentation

## 2016-07-28 DIAGNOSIS — C8190 Hodgkin lymphoma, unspecified, unspecified site: Secondary | ICD-10-CM

## 2016-07-28 DIAGNOSIS — C50921 Malignant neoplasm of unspecified site of right male breast: Secondary | ICD-10-CM

## 2016-07-28 DIAGNOSIS — R5382 Chronic fatigue, unspecified: Secondary | ICD-10-CM

## 2016-07-28 DIAGNOSIS — Z833 Family history of diabetes mellitus: Secondary | ICD-10-CM | POA: Insufficient documentation

## 2016-07-28 LAB — COMPREHENSIVE METABOLIC PANEL
ALBUMIN: 3.5 g/dL (ref 3.5–5.0)
ALT: 19 U/L (ref 17–63)
ANION GAP: 7 (ref 5–15)
AST: 25 U/L (ref 15–41)
Alkaline Phosphatase: 54 U/L (ref 38–126)
BILIRUBIN TOTAL: 0.8 mg/dL (ref 0.3–1.2)
BUN: 11 mg/dL (ref 6–20)
CO2: 25 mmol/L (ref 22–32)
Calcium: 8.6 mg/dL — ABNORMAL LOW (ref 8.9–10.3)
Chloride: 107 mmol/L (ref 101–111)
Creatinine, Ser: 1.61 mg/dL — ABNORMAL HIGH (ref 0.61–1.24)
GFR calc Af Amer: 49 mL/min — ABNORMAL LOW (ref >60)
GFR calc non Af Amer: 43 mL/min — ABNORMAL LOW (ref >60)
GLUCOSE: 190 mg/dL — AB (ref 65–99)
POTASSIUM: 4.2 mmol/L (ref 3.5–5.1)
SODIUM: 139 mmol/L (ref 135–145)
TOTAL PROTEIN: 6.3 g/dL — AB (ref 6.5–8.1)

## 2016-07-28 LAB — CBC WITH DIFFERENTIAL/PLATELET
BASOS PCT: 1 %
Basophils Absolute: 0.1 10*3/uL (ref 0.0–0.1)
EOS ABS: 0.5 10*3/uL (ref 0.0–0.7)
EOS PCT: 6 %
HCT: 42.1 % (ref 39.0–52.0)
Hemoglobin: 13.7 g/dL (ref 13.0–17.0)
Lymphocytes Relative: 26 %
Lymphs Abs: 2.2 10*3/uL (ref 0.7–4.0)
MCH: 30.2 pg (ref 26.0–34.0)
MCHC: 32.5 g/dL (ref 30.0–36.0)
MCV: 92.9 fL (ref 78.0–100.0)
MONO ABS: 0.7 10*3/uL (ref 0.1–1.0)
MONOS PCT: 8 %
Neutro Abs: 5 10*3/uL (ref 1.7–7.7)
Neutrophils Relative %: 59 %
Platelets: 241 10*3/uL (ref 150–400)
RBC: 4.53 MIL/uL (ref 4.22–5.81)
RDW: 13.3 % (ref 11.5–15.5)
WBC: 8.4 10*3/uL (ref 4.0–10.5)

## 2016-07-28 NOTE — Progress Notes (Signed)
Ryan [name]., MD [place] [address]  Infiltrating ductal carcinoma of right breast, stage 2 ([place]) - Plan: CBC with Differential, Comprehensive metabolic panel  Hodgkin lymphoma, unspecified Hodgkin lymphoma type, unspecified body region ([place]) - Plan: CBC with Differential, Comprehensive metabolic panel  CURRENT THERAPY: Surveillance per NCCN guidelines  INTERVAL HISTORY: Ryan [name] 65 y.o. male returns for followup of Stage II cancer the right breast, grade 1, without LV I. ER +96%, PR 4%, Ki-67 marker low at 19%, HER-2/neu nonamplified. Oncotype score was 14. He was placed on tamoxifen 10 mg twice a day which he will take for 5 full years starting 03/08/2011. He is status post right mastectomy in April 2012 at which time he was found to have a 2.1 cm primary, 3 sentinel nodes were negative.  Genetic testing in November 2015 was POSITIVE for CHEK2. AND Hodgkin's [phone]     Invasive ductal carcinoma of right breast, stage 2- 2012 s/p chemo and mastectomy   12/14/2010 Initial Diagnosis    Right needle core biopsy at 9 o'clock position positive for invasive ductal carcinoma      01/17/2011 Surgery    Right simple mastectomy showing a 2.1 cm invasive ductal carcinoma, clear marhins, 0/2 lymph nodes.  ER 96%, PR 84%, Ki-67 19%, Her2 negative.      03/08/2011 - 03/09/2016 Chemotherapy    Tamoxifen x 5 years      08/17/2014 Genetic Testing    Patient has genetic testing done forBreast/Ovarian cancer panel through [facility] . 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       08/09/2015 Pathology Results    BCI- Low risk of late recurrence years 5-10 (3.7%), low likelihood of benefit from extended endocrine therapy, low risk fo overall recurrence years 0-10 (6.6%), and low liklihood of benefit from extended endocrine therapy.       Hodgkin's [id number]   04/27/2011 Initial  Diagnosis    Hodgkin's [id number]      He is doing well. His appetite is strong. Chronic fatigue remains an issue for the patient. He reports that he does not have the impetus or the Drive to be active throughout the day. He notes that he sleeps the majority of the day.  He denies any signs or symptoms of VTE.  He denies any breast abnormalities.  He does note a right upper thorax redness that occurs with sun exposure. He denies any pruritus or pain in this area.  He notes shortness of breath with exertion. He denies any chest pain or diaphoresis. He is on inhalers and he is moderately compliant with his long-acting inhaler.  Review of Systems  Constitutional: Positive for malaise/fatigue. Negative for chills, fever and weight loss.  HENT: Negative.   Eyes: Negative.  Negative for blurred vision.  Respiratory: Positive for shortness of breath (with exertion only). Negative for cough.   Cardiovascular: Negative.  Negative for chest pain.  Gastrointestinal: Negative.  Negative for constipation, diarrhea, nausea and vomiting.  Genitourinary: Negative.   Musculoskeletal: Negative.  Negative for falls.  Skin: Positive for rash.  Neurological: Negative.  Negative for weakness.  Endo/Heme/Allergies: Negative.   Psychiatric/Behavioral: Negative.     Past Medical History:  Diagnosis Date  . Adie's pupil   . Anemia 07/14/2013  . Borderline diabetes   . CAD (coronary artery disease), native coronary artery 04/09/2013   Dist LM stenosis 70-80% (at trifurcation into LAD, CX, &  2 Ramus branches, 70% mid RCA);; b) Myoview 08/06/14: Low Risk, no Ischemia/infarction  . Depression 1983   "after I was dx'd w/CA; now just comes in spells"  (12/22/2015)  . Essential hypertension   . Exertional shortness of breath   . Hodgkin's lymphoma ([place]) 1983  . Hypercholesterolemia   . Hypothyroidism   . Invasive ductal carcinoma of breast, stage 2 ([place]) 04/27/2011   chemo, mastectomy  . Left carotid  artery partial occlusion 04/27/2011   CAROTIC DOPPLER 11/2013: < 40% R ICA stenosis, distal waveforms damped - suggests probably intracranial occlusoin, < 81% LICA, normal vertebrals -- no change  . Mild mitral stenosis by prior echocardiogram 08/06/2014    Noted on echocardiogram  . Restless leg syndrome   . Right carotid artery occlusion 04/27/2011  . S/P CABG x 3 04/09/2013   LIMA-LAD, fLRAD-RI, SVG-RCA; Echo 10/29/'15:  mild Conc LVH, no WMA, Gr 1 DD, Mod AoV Sclerosis w/ Mod AI, ~Mild-Mod MS   . Stroke [facility]    2007  . Thyroid disease     Past Surgical History:  Procedure Laterality Date  . BREAST BIOPSY Right   . CARDIAC CATHETERIZATION  2014  . CATARACT EXTRACTION W/PHACO Right 06/21/2015   Procedure: CATARACT EXTRACTION PHACO AND INTRAOCULAR LENS PLACEMENT ([place]);  Surgeon: Tonny Branch, MD;  Location: [facility];  Service: Ophthalmology;  Laterality: Right;  CDE: 7.05  . CATARACT EXTRACTION W/PHACO Left 07/01/2015   Procedure: CATARACT EXTRACTION PHACO AND INTRAOCULAR LENS PLACEMENT ([facility]);  Surgeon: Tonny Branch, MD;  Location: [facility];  Service: Ophthalmology;  Laterality: Left;  CDE: 7.49  . COLONOSCOPY [name] 05/06/2015   Procedure: COLONOSCOPY;  Surgeon: Rogene Houston, MD;  Location[facility];  Service: Endoscopy;  Laterality: [name];  930  . CORONARY ARTERY BYPASS GRAFT [name] 04/08/2013   Procedure: CORONARY ARTERY BYPASS GRAFTING (CABG);  Surgeon: Ivin Poot, MD;  Location: [place];  Service: Open Heart Surgery;  Laterality: [name];  Times 3 using left internal mammary artery; endoscopically harvested right saphenous vein and left radial artery  . INTRAOPERATIVE TRANSESOPHAGEAL ECHOCARDIOGRAM [name] 04/08/2013   Procedure: INTRAOPERATIVE TRANSESOPHAGEAL ECHOCARDIOGRAM;  Surgeon: Ivin Poot, MD;  Location: [place];  Service: Open Heart Surgery;  Laterality: [name];  . LAPAROSCOPIC APPENDECTOMY [name] 12/23/2015   Procedure: APPENDECTOMY LAPAROSCOPIC;  Surgeon: Donnie Mesa, MD;  Location: [place];   Service: General;  Laterality: [name];  . LEFT HEART CATHETERIZATION WITH CORONARY ANGIOGRAM [name] 04/07/2013   Procedure: LEFT HEART CATHETERIZATION WITH CORONARY ANGIOGRAM;  Surgeon: Leonie Man, MD;  Location: [facility] CATH LAB;  Service: Cardiovascular;  Laterality: [name];  . LYMPH NODE BIOPSY Left 1983   neck  . MASTECTOMY Right    w/axillary lymph node dissection  . NM TREADMILL MYOVIEW LTD  08/06/2014   Exercise 5:07 min, 6.3 METS; symptoms noted or extreme dyspnea and lightheadedness with some chest tightness. No EKG changes. No ischemia or infarction was normal EF.  Marland Kitchen RADIAL ARTERY HARVEST Left 04/08/2013   Procedure: RADIAL ARTERY HARVEST;  Surgeon: Ivin Poot, MD;  Location: [place];  Service: Open Heart Surgery;  Laterality: Left;  . TONSILLECTOMY  1974  . TRANSTHORACIC ECHOCARDIOGRAM  08/06/2014    Normal EF 55-60%. Gr 1 DD - increased LVEDP. Moderate aortic valve thickening suggestive of AoV Sclerosis but not stenosis. Mild AI; MIld MS - mean gradient of 7 mmHg & HR of 122 bpm.    Family History  Problem Relation Age of Onset  . Alzheimer's disease Mother   .  Stroke Mother   . Hypertension Mother   . Hyperlipidemia Mother   . Diabetes Mother   . Hypertension Father   . Diabetes Paternal Grandmother   . Diabetes Paternal Grandfather   . Breast cancer Paternal Aunt   . Ovarian cancer Paternal Aunt     Social History   Social History  . Marital status: Married    Spouse name: [name]  . Number of children: 2  . Years of education: [name]   Social History Main Topics  . Smoking status: Former Smoker    Packs/day: 1.00    Years: 15.00    Types: Cigarettes    Quit date: 06/09/1982  . Smokeless tobacco: Never Used  . Alcohol use No  . Drug use: No  . Sexual activity: No   Other Topics Concern  . None   Social History Narrative   Married. Former smoker who quit in 1983.   No routine exercise - gained ~40lb post CABG.     PHYSICAL EXAMINATION  ECOG PERFORMANCE STATUS: 1  - Symptomatic but completely ambulatory  Vitals:   07/28/16 0948  BP: 131/81  Pulse: 94  Resp: 18  Temp: 97.7 F (36.5 C)    GENERAL:alert, no distress, well nourished, well developed, comfortable, cooperative, obese, smiling and accompanied by wife. SKIN: no rashes or significant lesions, positive for: right upper chest (inferior to clavicle) erythema with dry skin. HEAD: Normocephalic, No masses, lesions, tenderness or abnormalities EYES: normal, EOMI, Conjunctiva are pink and non-injected EARS: External ears normal OROPHARYNX:lips, buccal mucosa, and tongue normal and mucous membranes are moist  NECK: supple, no adenopathy, thyroid normal size, non-tender, without nodularity, trachea midline LYMPH:  no palpable lymphadenopathy, no hepatosplenomegaly BREAST:left breast normal without mass, skin or nipple changes or axillary nodes, right post-mastectomy site well healed and free of suspicious changes LUNGS: clear to auscultation and percussion HEART: regular rate & rhythm, no murmurs, no gallops, S1 normal and S2 normal ABDOMEN:abdomen soft and normal bowel sounds BACK: Back symmetric, no curvature. EXTREMITIES:less then 2 second capillary refill, no joint deformities, effusion, or inflammation, no edema, no skin discoloration, no cyanosis  NEURO: alert & oriented x 3 with fluent speech, no focal motor/sensory deficits, gait normal   LABORATORY DATA: CBC    Component Value Date/Time   WBC 8.4 07/28/2016 0930   RBC 4.53 07/28/2016 0930   HGB 13.7 07/28/2016 0930   HCT 42.1 07/28/2016 0930   PLT 241 07/28/2016 0930   MCV 92.9 07/28/2016 0930   MCH 30.2 07/28/2016 0930   MCHC 32.5 07/28/2016 0930   RDW 13.3 07/28/2016 0930   LYMPHSABS 2.2 07/28/2016 0930   MONOABS 0.7 07/28/2016 0930   EOSABS 0.5 07/28/2016 0930   BASOSABS 0.1 07/28/2016 0930      Chemistry      Component Value Date/Time   NA 139 07/28/2016 0930   K 4.2 07/28/2016 0930   CL 107 07/28/2016 0930   CO2  25 07/28/2016 0930   BUN 11 07/28/2016 0930   CREATININE 1.61 (H) 07/28/2016 0930   CREATININE 1.45 (H) 07/14/2013 1639      Component Value Date/Time   CALCIUM 8.6 (L) 07/28/2016 0930   ALKPHOS 54 07/28/2016 0930   AST 25 07/28/2016 0930   ALT 19 07/28/2016 0930   BILITOT 0.8 07/28/2016 0930        PENDING LABS:   RADIOGRAPHIC STUDIES:  No results found.   PATHOLOGY:    ASSESSMENT AND PLAN:  Invasive ductal carcinoma of right breast,  stage 2- 2012 s/p chemo and mastectomy Stage II cancer the right breast, grade 1, without LV I. ER +96%, PR 4%, Ki-67 marker low at 19%, HER-2/neu nonamplified. Oncotype score was 14.  He is status post right mastectomy in April 2012 at which time he was found to have a 2.1 cm primary, 3 sentinel nodes were negative. He was placed on tamoxifen 20 mg a day x 5 years (03/08/2011- 03/09/2016).  Genetic testing in November 2015 was POSITIVE for CHEK2.  CHEK2 mutations have been found to be associated with an increased risk of breast and other cancers. The estimated cancer risks vary widely and may be influenced by family history. Women with a CHEK2 deleterious mutation have approximately a 24% (no family history of breast cancer) to 48% (strong family history of breast cancer) lifetime risk of breast cancer and up to a 25% risk of a second breast cancer. Men may have an increased risk for male breast cancer of about 1%. Men and women may have an increased risk of colon cancer (~10% lifetime risk). Other cancers, including ovarian, uterine and thyroid, have been associated with CHEK2 mutations, but no specific risk has been established. According to the NCCN guidelines, women with CHEK2 mutations should consider breast MRI's as a part of regular breast cancer screening. There are no other established management guidelines for individuals with disease causing mutations in CHEK2. However, management options based on other genes associated with high risks of  breast cancer (such as the BRCA1 and BRCA2) may be appropriate to follow.   Oncology history is updated.  Labs today: CBC diff, CMET.  I personally reviewed and went over laboratory results with the patient.  The results are noted within this dictation.  He is no longer on Vitamin K Antagonist therapy and is now on adult ASA daily after discussion with his cardiologist and neurologist.  He is to continue with annual mammogram.  He is scheduled for his next mammogram in November 2017.  Labs in 1 year: CBC diff, CMET.  Return in 12 months for follow-up and to confirm scheduling and ordering of annual mammogram.  Hodgkin's [phone] Hodgkin's disease, treated in 1983.  Treatment included mantle radiation.   NED   ORDERS PLACED FOR THIS ENCOUNTER: Orders Placed This Encounter  Procedures  . CBC with Differential  . Comprehensive metabolic panel    MEDICATIONS PRESCRIBED THIS ENCOUNTER: No orders of the defined types were placed in this encounter.   THERAPY PLAN:  Male Breast and Prostate Management Options We reviewed the NCCN practice guidelines (v.2.2015) for male breast and prostate management for men at high risk of male breast and prostate cancer because of BRCA1 or BRCA2 mutations:  1. Breast self-exam training and education starting at age 86.  2. Clinical breast exam, every 6-12 months, starting at age 29 years  62. Consider baseline screening mammogram at age 44 with annual mammograms if gynecomastia or parenchymal/glandular breast density on baseline study.  4. Consider prostate cancer screening starting at age 55 years.  All questions were answered. The patient knows to call the clinic with any problems, questions or concerns. We can certainly see the patient much sooner if necessary.  Patient and plan discussed with Dr. Ancil Linsey and she is in agreement with the aforementioned.   This note is electronically signed by: Doy Mince 07/28/2016 7:11 PM

## 2016-07-28 NOTE — Assessment & Plan Note (Signed)
Hodgkin's disease, treated in 1983.  Treatment included mantle radiation.   NED

## 2016-07-28 NOTE — Patient Instructions (Addendum)
[place] at [facility] Discharge Instructions  RECOMMENDATIONS MADE BY THE CONSULTANT AND ANY TEST RESULTS WILL BE SENT TO YOUR REFERRING PHYSICIAN.  You saw Ryan Crigler, PA-C, today. Follow up with Dr. Whitney Muse in 12 months with lab work. Mammogram due in November and is scheduled already per [facility].  Thank you for choosing [place] at [facility] to provide your oncology and hematology care.  To afford each patient quality time with our provider, please arrive at least 15 minutes before your scheduled appointment time.   Beginning January 23rd 2017 lab work for the Ryan [name] will be done in the  Main lab at [facility] on 1st floor. If you have a lab appointment with the [facility] please come in thru the  Main Entrance and check in at the main information desk  You need to re-schedule your appointment should you arrive 10 or more minutes late.  We strive to give you quality time with our providers, and arriving late affects you and other patients whose appointments are after yours.  Also, if you no show three or more times for appointments you may be dismissed from the clinic at the providers discretion.     Again, thank you for choosing [facility].  Our hope is that these requests will decrease the amount of time that you wait before being seen by our physicians.       _____________________________________________________________  Should you have questions after your visit to [facility], please contact our office at [phone] between the hours of 8:30 a.m. and 4:30 p.m.  Voicemails left after 4:30 p.m. will not be returned until the following business day.  For prescription refill requests, have your pharmacy contact our office.         Resources For Cancer Patients and their Caregivers ? American Cancer Society: Can assist with transportation, wigs, general needs, runs Look Good Feel Better.         ([phone] ? Cancer Care: Provides financial assistance, online support groups, medication/co-pay assistance.  1-800-813-HOPE 845-809-8722) ? [place] Assists [place] cancer patients and their families through emotional , educational and financial support.  775-148-4418 ? [place] DSS Where to apply for food stamps, Medicaid and utility assistance. 559-120-5493 ? [facility]: Transportation to medical appointments. 731-130-6079 ? Social Security Administration: May apply for disability if have a Stage IV cancer. (434)618-4599 (604)577-9149 ? [employer], Disability and Transit Services: Assists with nutrition, care and transit needs. [place] Support Programs: @10RELATIVEDAYS @ > Cancer Support Group  2nd Tuesday of the month 1pm-2pm, Journey Room  > Creative Journey  3rd Tuesday of the month 1130am-1pm, Journey Room  > Look Good Feel Better  1st Wednesday of the month 10am-12 noon, Journey Room (Call [place] to register [phone])

## 2016-07-28 NOTE — Assessment & Plan Note (Addendum)
Stage II cancer the right breast, grade 1, without LV I. ER +96%, PR 4%, Ki-67 marker low at 19%, HER-2/neu nonamplified. Oncotype score was 14.  He is status post right mastectomy in April 2012 at which time he was found to have a 2.1 cm primary, 3 sentinel nodes were negative. He was placed on tamoxifen 20 mg a day x 5 years (03/08/2011- 03/09/2016).  Genetic testing in November 2015 was POSITIVE for CHEK2.  CHEK2 mutations have been found to be associated with an increased risk of breast and other cancers. The estimated cancer risks vary widely and may be influenced by family history. Women with a CHEK2 deleterious mutation have approximately a 24% (no family history of breast cancer) to 48% (strong family history of breast cancer) lifetime risk of breast cancer and up to a 25% risk of a second breast cancer. Men may have an increased risk for male breast cancer of about 1%. Men and women may have an increased risk of colon cancer (~10% lifetime risk). Other cancers, including ovarian, uterine and thyroid, have been associated with CHEK2 mutations, but no specific risk has been established. According to the NCCN guidelines, women with CHEK2 mutations should consider breast MRI's as a part of regular breast cancer screening. There are no other established management guidelines for individuals with disease causing mutations in CHEK2. However, management options based on other genes associated with high risks of breast cancer (such as the BRCA1 and BRCA2) may be appropriate to follow.   Oncology history is updated.  Labs today: CBC diff, CMET.  I personally reviewed and went over laboratory results with the patient.  The results are noted within this dictation.  He is no longer on Vitamin K Antagonist therapy and is now on adult ASA daily after discussion with his cardiologist and neurologist.  He is to continue with annual mammogram.  He is scheduled for his next mammogram in November 2017.  Labs in 1  year: CBC diff, CMET.  Return in 12 months for follow-up and to confirm scheduling and ordering of annual mammogram.

## 2016-08-23 ENCOUNTER — Other Ambulatory Visit (HOSPITAL_COMMUNITY): Payer: Self-pay | Admitting: Internal Medicine

## 2016-08-23 ENCOUNTER — Ambulatory Visit (HOSPITAL_COMMUNITY): Payer: Medicare Other

## 2016-08-23 DIAGNOSIS — Z853 Personal history of malignant neoplasm of breast: Secondary | ICD-10-CM

## 2016-09-14 DIAGNOSIS — N183 Chronic kidney disease, stage 3 (moderate): Secondary | ICD-10-CM | POA: Diagnosis not present

## 2016-09-14 DIAGNOSIS — E119 Type 2 diabetes mellitus without complications: Secondary | ICD-10-CM | POA: Diagnosis not present

## 2016-09-14 DIAGNOSIS — R739 Hyperglycemia, unspecified: Secondary | ICD-10-CM | POA: Diagnosis not present

## 2016-09-14 DIAGNOSIS — I10 Essential (primary) hypertension: Secondary | ICD-10-CM | POA: Diagnosis not present

## 2016-09-19 ENCOUNTER — Ambulatory Visit (HOSPITAL_COMMUNITY)
Admission: RE | Admit: 2016-09-19 | Discharge: 2016-09-19 | Disposition: A | Discharge status: Home / Self Care | Payer: Medicare Other | Source: Ambulatory Visit | Attending: Internal Medicine | Admitting: Internal Medicine

## 2016-09-19 DIAGNOSIS — R928 Other abnormal and inconclusive findings on diagnostic imaging of breast: Secondary | ICD-10-CM | POA: Diagnosis not present

## 2016-09-19 DIAGNOSIS — Z853 Personal history of malignant neoplasm of breast: Secondary | ICD-10-CM | POA: Diagnosis not present

## 2016-10-13 DIAGNOSIS — B351 Tinea unguium: Secondary | ICD-10-CM | POA: Diagnosis not present

## 2016-10-13 DIAGNOSIS — Z1389 Encounter for screening for other disorder: Secondary | ICD-10-CM | POA: Diagnosis not present

## 2016-10-13 DIAGNOSIS — Z6831 Body mass index (BMI) 31.0-31.9, adult: Secondary | ICD-10-CM | POA: Diagnosis not present

## 2016-10-13 DIAGNOSIS — C50911 Malignant neoplasm of unspecified site of right female breast: Secondary | ICD-10-CM | POA: Diagnosis not present

## 2016-10-13 DIAGNOSIS — E6609 Other obesity due to excess calories: Secondary | ICD-10-CM | POA: Diagnosis not present

## 2016-10-13 DIAGNOSIS — E063 Autoimmune thyroiditis: Secondary | ICD-10-CM | POA: Diagnosis not present

## 2016-10-13 DIAGNOSIS — C8190 Hodgkin lymphoma, unspecified, unspecified site: Secondary | ICD-10-CM | POA: Diagnosis not present

## 2016-10-13 DIAGNOSIS — E669 Obesity, unspecified: Secondary | ICD-10-CM | POA: Diagnosis not present

## 2016-12-20 ENCOUNTER — Encounter (HOSPITAL_COMMUNITY): Payer: Medicare Other

## 2016-12-20 ENCOUNTER — Ambulatory Visit: Payer: Medicare Other | Admitting: Family

## 2016-12-20 ENCOUNTER — Ambulatory Visit: Payer: Medicare Other

## 2016-12-23 IMAGING — CT CT CHEST W/O CM
2 of 3 series · 15 of 36 positions shown, 18 images · non-contrast
Comparison: 08/06/2013 and 07/24/2011.

CLINICAL DATA: Gradual worsening of shortness of breath over the
last 3 years. History of right-sided breast carcinoma, prior CABG
and prior treated Hodgkin's lymphoma. History of scattered chronic
pulmonary nodules.

EXAM:
CT CHEST WITHOUT CONTRAST
TECHNIQUE: Multidetector CT imaging of the chest was performed following the
standard protocol without IV contrast.

[Series 2: chestroutine 5.0 b40f · axial · 0.82mm/px · z∈[-372,-58]mm · 12 of 75 slices shown, 15 images]
[im 6/75  mediastinal]
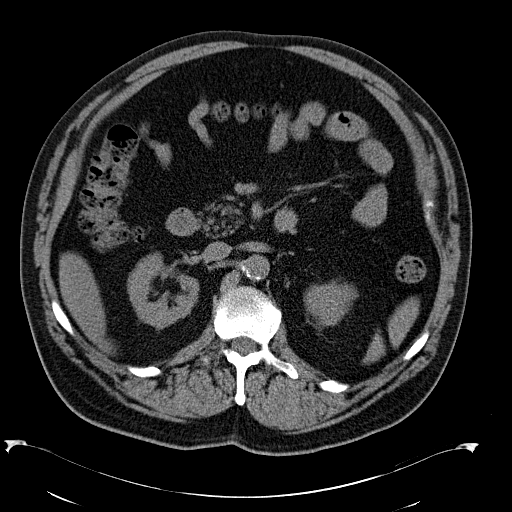
[im 6/75  lung]
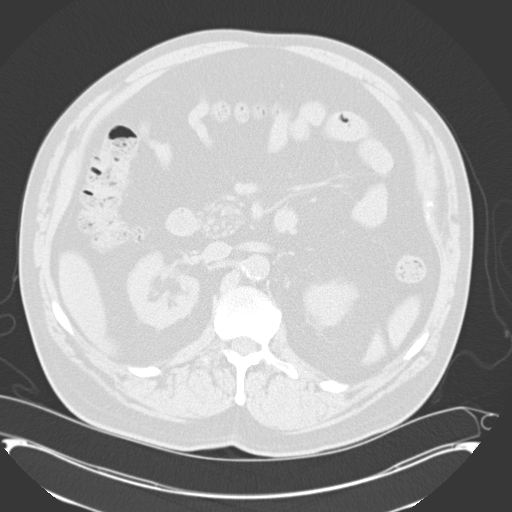
[im 11/75  lung]
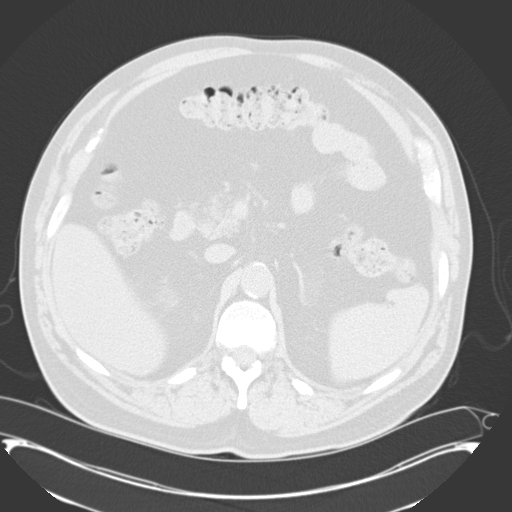
[im 17/75  lung]
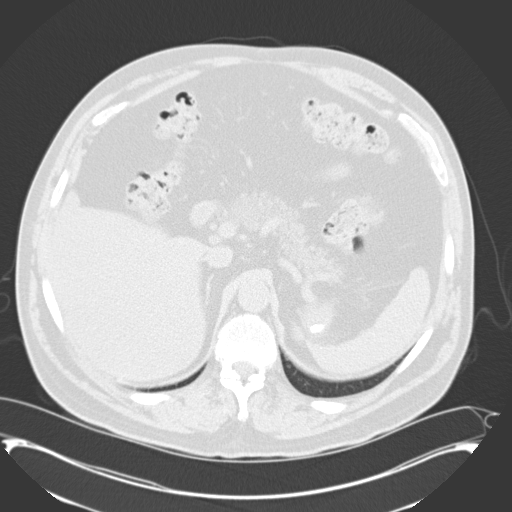
[im 22/75  lung]
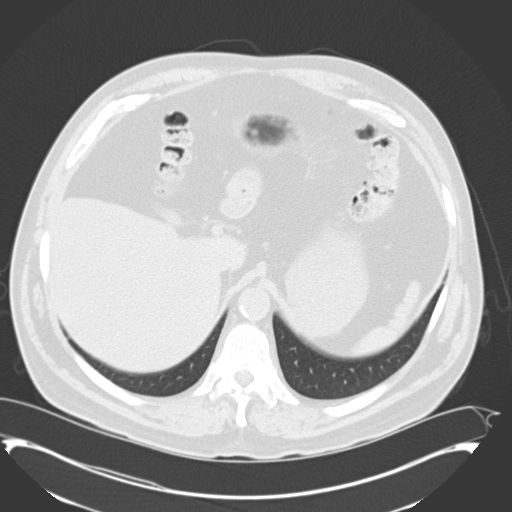
[im 28/75  mediastinal]
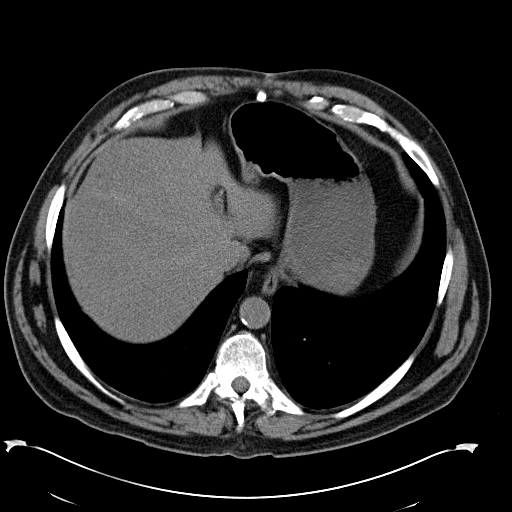
[im 28/75  lung]
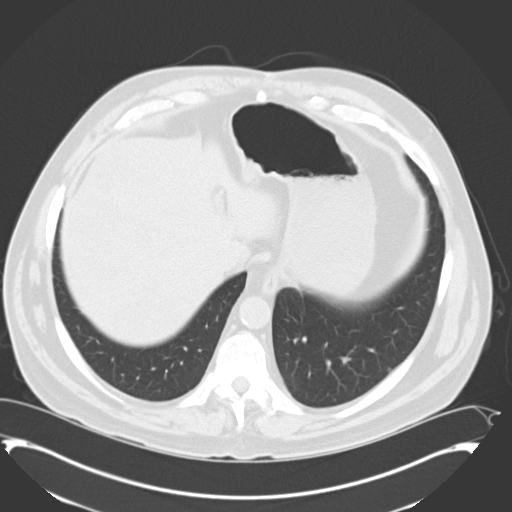
[im 33/75  lung]
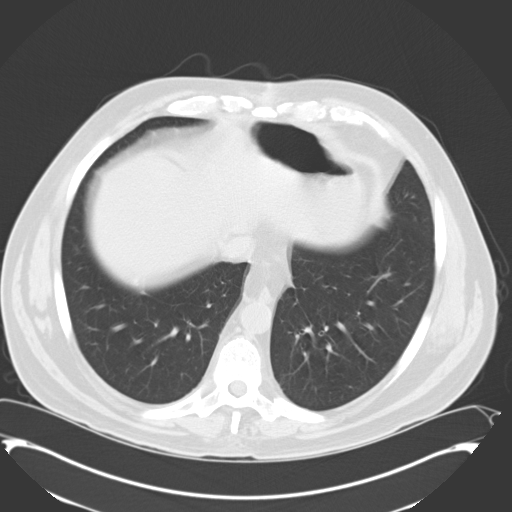
[im 42/75  lung]
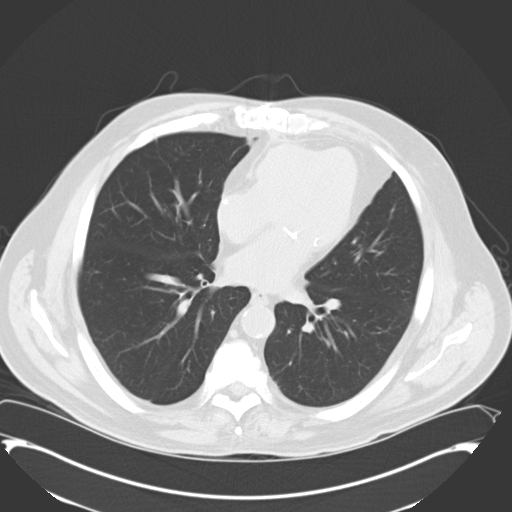
[im 47/75  lung]
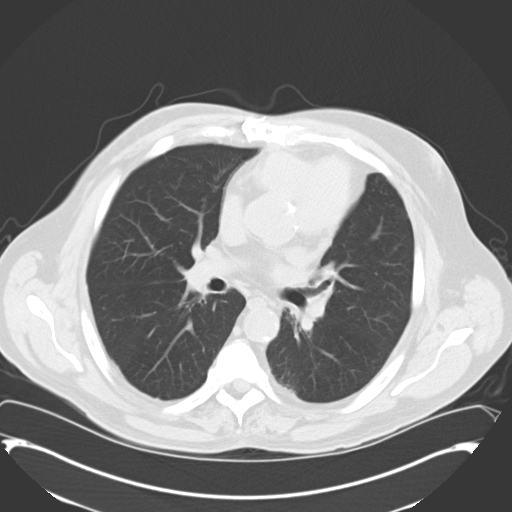
[im 53/75  mediastinal]
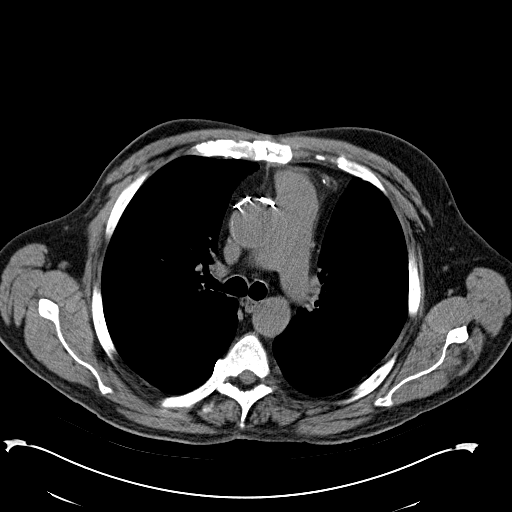
[im 53/75  lung]
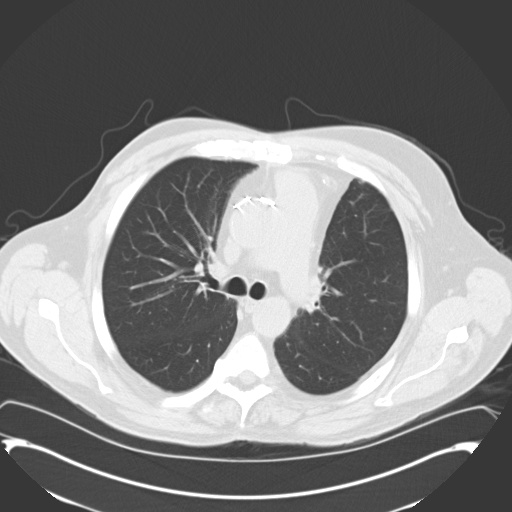
[im 58/75  lung]
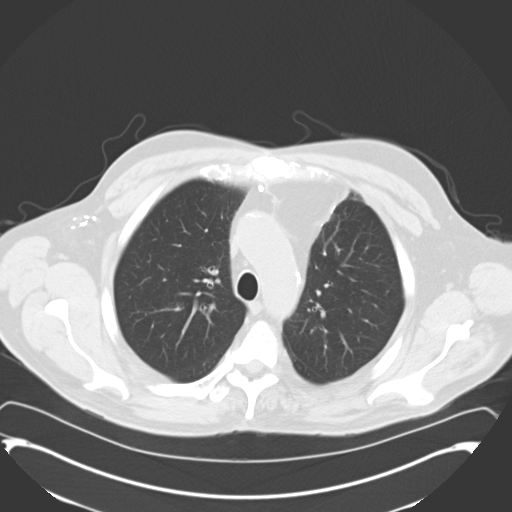
[im 64/75  lung]
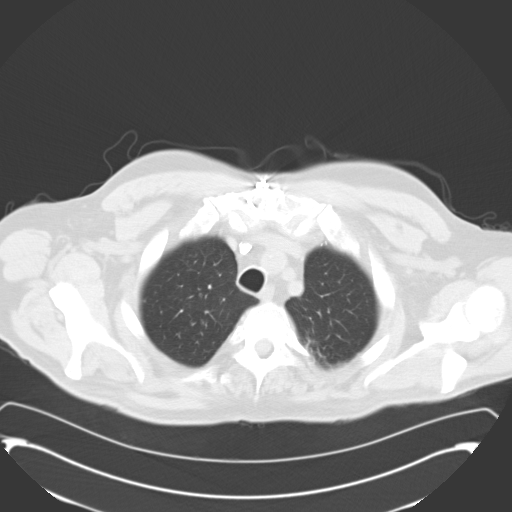
[im 69/75  lung]
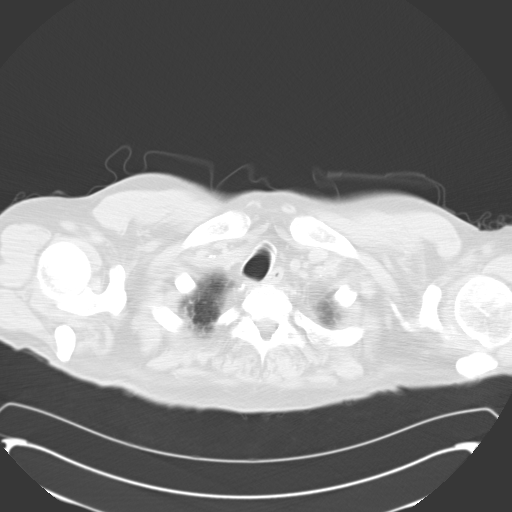

[Series 4: mpr coro 3mm · coronal · 0.75mm/px · 3 of 111 slices shown]
[im 23/111  lung]
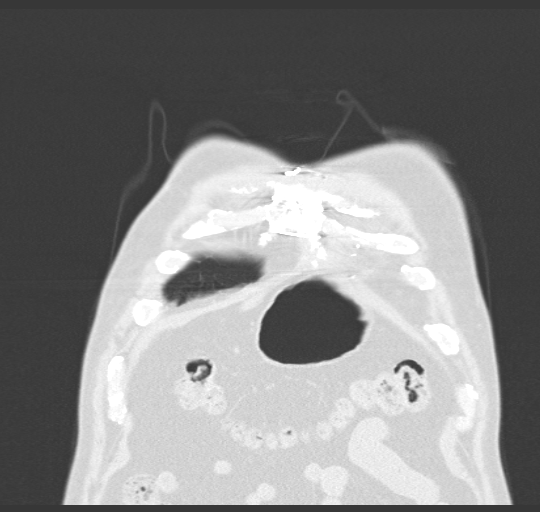
[im 45/111  lung]
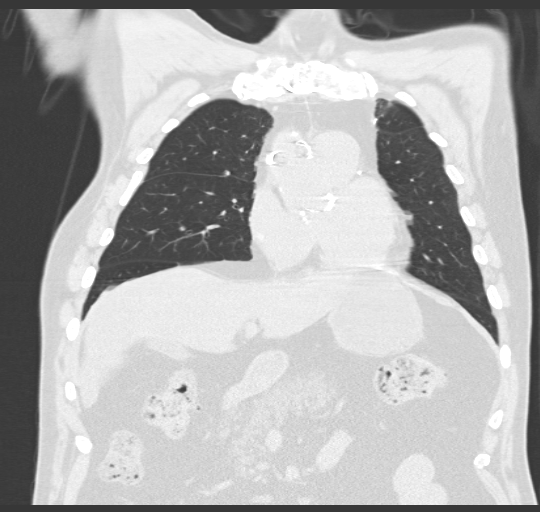
[im 67/111  lung]
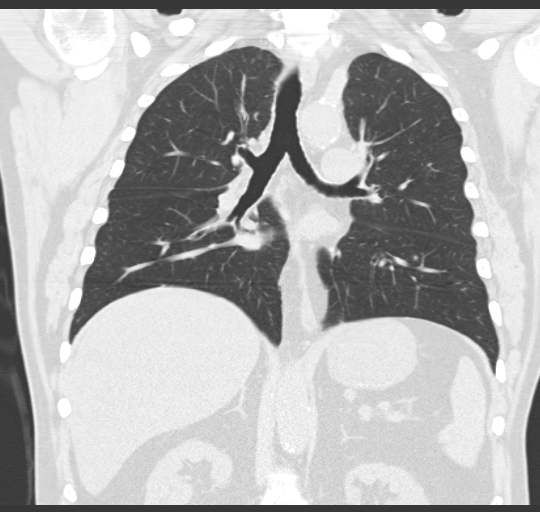

[15 of 36 positions shown; findings below may reference images not displayed]

FINDINGS: Stable scattered tiny pulmonary nodular densities and a more
discrete 8 mm subpleural the anterior and lateral right lower lobe
nodule. No new nodules are detected.

Lungs demonstrate stable small bleb at the posterior left apex and
stable mild areas of parenchymal scarring bilaterally. No
significant emphysematous disease or interstitial disease is
identified. No evidence of airway obstruction or bronchiectasis. No
edema, focal airspace consolidation or pneumothorax is identified.
No pleural or pericardial fluid is seen.

The heart size is normal and stable. Dense calcifications involving
the aortic and mitral valves again noted. No evidence of thoracic
aortic aneurysmal dilatation. No enlarged lymph nodes are seen.
Stable clips in the right axilla without mass or lymphadenopathy.
Visualized upper abdomen is unremarkable. No significant bony
abnormalities.
IMPRESSION: Stable 8 mm subpleural right lower lobe nodule and scattered tiny
nodular densities in the lungs. No evidence of progressive chronic
lung disease or acute pulmonary process.

## 2016-12-29 ENCOUNTER — Encounter: Payer: Self-pay | Admitting: Vascular Surgery

## 2017-01-10 ENCOUNTER — Ambulatory Visit (INDEPENDENT_AMBULATORY_CARE_PROVIDER_SITE_OTHER): Payer: Medicare Other | Admitting: Physician Assistant

## 2017-01-10 ENCOUNTER — Telehealth: Payer: Self-pay | Admitting: Vascular Surgery

## 2017-01-10 ENCOUNTER — Ambulatory Visit (HOSPITAL_COMMUNITY)
Admission: RE | Admit: 2017-01-10 | Discharge: 2017-01-10 | Disposition: A | Discharge status: Home / Self Care | Payer: Medicare Other | Source: Ambulatory Visit | Attending: Family | Admitting: Family

## 2017-01-10 VITALS — BP 116/72 | HR 90 | Temp 98.7°F | Resp 20 | Ht 75.0 in | Wt 252.8 lb

## 2017-01-10 DIAGNOSIS — Z8673 Personal history of transient ischemic attack (TIA), and cerebral infarction without residual deficits: Secondary | ICD-10-CM | POA: Insufficient documentation

## 2017-01-10 DIAGNOSIS — I6523 Occlusion and stenosis of bilateral carotid arteries: Secondary | ICD-10-CM | POA: Diagnosis not present

## 2017-01-10 DIAGNOSIS — I6521 Occlusion and stenosis of right carotid artery: Secondary | ICD-10-CM

## 2017-01-10 DIAGNOSIS — I779 Disorder of arteries and arterioles, unspecified: Secondary | ICD-10-CM | POA: Diagnosis not present

## 2017-01-10 DIAGNOSIS — I6522 Occlusion and stenosis of left carotid artery: Secondary | ICD-10-CM | POA: Diagnosis not present

## 2017-01-10 DIAGNOSIS — I739 Peripheral vascular disease, unspecified: Principal | ICD-10-CM

## 2017-01-10 NOTE — Progress Notes (Signed)
HISTORY AND PHYSICAL     CC:  Follow up carotid disease Referring Provider:  Redmond School, MD  HPI: This is a 66 y.o. male pt of Dr. Scot Dock who has a known right ICA occlusion and comes in today for follow up.  He has a hx of right CVA in 2007 where he had left hemiplegia and he does not have any residual.  He denies any amaurosis fugax, hemiplegia or difficulty with speech.  His biggest complaint today is shortness of breath and lack of energy.  He does have a remote hx of Hodgkin's Disease treated with mantle radiation in 1983 and subsequent right breast cancer in 2012 that was treated with surgery.  He states that he has a hx of CABG x 3 by Dr. Lawson Fiscal in 2014.  He has been followed by Dr. Ellyn Hack but has not seen him in a while as the calendar was not available for the next year to make the appointment.    He states that he gets short of breath with very little activity.  He does see a pulmonologist in [place] who is following him.  He states that he changed his inhalers at the last visit.    He states he was on Aggrenox but it was not compatible with Tamoxifen and was changed to Warfarin.  He has since been taken off of this and placed on a baby aspirin daily.  He does take a statin for cholesterol management.  He is on synthroid for hypothyroidism.  He is on an antidepressant.  His creatinine in October 2017 was 1.61 and is being followed by his PCP.  States he is borderline diabetic, which is also being followed by PCP.  Past Medical History:  Diagnosis Date  . Adie's pupil   . Anemia 07/14/2013  . Borderline diabetes   . CAD (coronary artery disease), native coronary artery 04/09/2013   Dist LM stenosis 70-80% (at trifurcation into LAD, CX, & 2 Ramus branches, 70% mid RCA);; b) Myoview 08/06/14: Low Risk, no Ischemia/infarction  . Depression 1983   "after I was dx'd w/CA; now just comes in spells"  (12/22/2015)  . Essential hypertension   . Exertional shortness of breath   .  Hodgkin's lymphoma ([place]) 1983  . Hypercholesterolemia   . Hypothyroidism   . Invasive ductal carcinoma of breast, stage 2 ([place]) 04/27/2011   chemo, mastectomy  . Left carotid artery partial occlusion 04/27/2011   CAROTIC DOPPLER 11/2013: < 40% R ICA stenosis, distal waveforms damped - suggests probably intracranial occlusoin, < 71% LICA, normal vertebrals -- no change  . Mild mitral stenosis by prior echocardiogram 08/06/2014    Noted on echocardiogram  . Restless leg syndrome   . Right carotid artery occlusion 04/27/2011  . S/P CABG x 3 04/09/2013   LIMA-LAD, fLRAD-RI, SVG-RCA; Echo 10/29/'15:  mild Conc LVH, no WMA, Gr 1 DD, Mod AoV Sclerosis w/ Mod AI, ~Mild-Mod MS   . Stroke [facility])    2007  . Thyroid disease     Past Surgical History:  Procedure Laterality Date  . BREAST BIOPSY Right   . CARDIAC CATHETERIZATION  2014  . CATARACT EXTRACTION W/PHACO Right 06/21/2015   Procedure: CATARACT EXTRACTION PHACO AND INTRAOCULAR LENS PLACEMENT ([place]);  Surgeon: Tonny Branch, MD;  Location: [facility];  Service: Ophthalmology;  Laterality: Right;  CDE: 7.05  . CATARACT EXTRACTION W/PHACO Left 07/01/2015   Procedure: CATARACT EXTRACTION PHACO AND INTRAOCULAR LENS PLACEMENT (I[facility]);  Surgeon: Tonny Branch, MD;  Location: [facility];  Service: Ophthalmology;  Laterality: Left;  CDE: 7.49  . COLONOSCOPY [name] 05/06/2015   Procedure: COLONOSCOPY;  Surgeon: Rogene Houston, MD;  Location: [facility];  Service: Endoscopy;  Laterality: [name];  930  . CORONARY ARTERY BYPASS GRAFT [name] 04/08/2013   Procedure: CORONARY ARTERY BYPASS GRAFTING (CABG);  Surgeon: Ivin Poot, MD;  Location: [place];  Service: Open Heart Surgery;  Laterality: [name];  Times 3 using left internal mammary artery; endoscopically harvested right saphenous vein and left radial artery  . INTRAOPERATIVE TRANSESOPHAGEAL ECHOCARDIOGRAM [name] 04/08/2013   Procedure: INTRAOPERATIVE TRANSESOPHAGEAL ECHOCARDIOGRAM;  Surgeon: Ivin Poot, MD;  Location: [place];   Service: Open Heart Surgery;  Laterality: [name];  . LAPAROSCOPIC APPENDECTOMY [name] 12/23/2015   Procedure: APPENDECTOMY LAPAROSCOPIC;  Surgeon: Donnie Mesa, MD;  Location: [place];  Service: General;  Laterality: [name];  . LEFT HEART CATHETERIZATION WITH CORONARY ANGIOGRAM [name] 04/07/2013   Procedure: LEFT HEART CATHETERIZATION WITH CORONARY ANGIOGRAM;  Surgeon: Leonie Man, MD;  Location: [facility] CATH LAB;  Service: Cardiovascular;  Laterality: [name];  . LYMPH NODE BIOPSY Left 1983   neck  . MASTECTOMY Right    w/axillary lymph node dissection  . NM TREADMILL MYOVIEW LTD  08/06/2014   Exercise 5:07 min, 6.3 METS; symptoms noted or extreme dyspnea and lightheadedness with some chest tightness. No EKG changes. No ischemia or infarction was normal EF.  Marland Kitchen RADIAL ARTERY HARVEST Left 04/08/2013   Procedure: RADIAL ARTERY HARVEST;  Surgeon: Ivin Poot, MD;  Location: [place];  Service: Open Heart Surgery;  Laterality: Left;  . TONSILLECTOMY  1974  . TRANSTHORACIC ECHOCARDIOGRAM  08/06/2014    Normal EF 55-60%. Gr 1 DD - increased LVEDP. Moderate aortic valve thickening suggestive of AoV Sclerosis but not stenosis. Mild AI; MIld MS - mean gradient of 7 mmHg & HR of 122 bpm.    No Known Allergies  Current Outpatient Prescriptions  Medication Sig Dispense Refill  . ALPRAZolam (XANAX) 1 MG tablet Take 0.5 mg by mouth at bedtime as needed for anxiety.     Marland Kitchen aspirin EC 81 MG tablet Take 325 mg by mouth daily.     . Cholecalciferol (VITAMIN D-3 PO) Take 1 tablet by mouth daily.     . citalopram (CELEXA) 20 MG tablet Take 40 mg by mouth every evening.     . Coenzyme Q10 (CO Q10) 100 MG CAPS Take by mouth.    . diphenhydrAMINE (BENADRYL) 25 MG tablet Take 25 mg by mouth daily as needed for allergies.    . Fluticasone Furoate (ARNUITY ELLIPTA) 200 MCG/ACT AEPB Inhale into the lungs.    . Garlic 027 MG CAPS Take 500 mg by mouth daily.    . Ginkgo Biloba 60 MG CAPS Take 1 capsule by mouth daily.    Marland Kitchen  levothyroxine (SYNTHROID, LEVOTHROID) 75 MCG tablet Take 75 mcg by mouth daily.     . metoprolol tartrate (LOPRESSOR) 25 MG tablet Take 37.5 mg by mouth at bedtime.    . nitroGLYCERIN (NITROSTAT) 0.4 MG SL tablet Place 1 tablet (0.4 mg total) under the tongue every 5 (five) minutes as needed for chest pain. 25 tablet 3  . Omega-3 Fatty Acids (FISH OIL) 1000 MG CPDR Take 1 each by mouth daily.    Marland Kitchen rOPINIRole (REQUIP) 0.25 MG tablet Take 0.25 mg by mouth at bedtime.    . simvastatin (ZOCOR) 40 MG tablet Take 40 mg by mouth at bedtime.     Marland Kitchen Umeclidinium-Vilanterol (ANORO ELLIPTA) 62.5-25  MCG/INH AEPB Inhale 1 puff into the lungs daily.    Marland Kitchen acetaminophen (TYLENOL) 500 MG tablet Take 500 mg by mouth every 6 (six) hours as needed for mild pain.     . Albuterol Sulfate (PROAIR RESPICLICK) 147 (90 Base) MCG/ACT AEPB Inhale 1 puff into the lungs 4 (four) times daily as needed (shortness of breath.).    Marland Kitchen Calcium Carbonate-Vit D-Min (CALCIUM 1200 PO) Take 1 tablet by mouth daily.     Marland Kitchen Hawthorne Berry 550 MG CAPS Take 550 mg by mouth at bedtime. Reported on 01/28/2016    . tamoxifen (NOLVADEX) 20 MG tablet Take 1 tablet (20 mg total) by mouth daily. (Patient not taking: Reported on 01/10/2017) 30 tablet 5   No current facility-administered medications for this visit.     Family History  Problem Relation Age of Onset  . Alzheimer's disease Mother   . Stroke Mother   . Hypertension Mother   . Hyperlipidemia Mother   . Diabetes Mother   . Hypertension Father   . Diabetes Paternal Grandmother   . Diabetes Paternal Grandfather   . Breast cancer Paternal Aunt   . Ovarian cancer Paternal Aunt     Social History   Social History  . Marital status: Married    Spouse name: [name]  . Number of children: 2  . Years of education: [name]   Occupational History  . Not on file.   Social History Main Topics  . Smoking status: Former Smoker    Packs/day: 1.00    Years: 15.00    Types: Cigarettes    Quit  date: 06/09/1982  . Smokeless tobacco: Never Used  . Alcohol use No  . Drug use: No  . Sexual activity: No   Other Topics Concern  . Not on file   Social History Narrative   Married. Former smoker who quit in 1983.   No routine exercise - gained ~40lb post CABG.     REVIEW OF SYSTEMS:   [X]  denotes positive finding, [ ]  denotes negative finding Cardiac  Comments:  Chest pain or chest pressure:    Shortness of breath upon exertion: x   Short of breath when lying flat: x   Irregular heart rhythm:        Vascular    Pain in calf, thigh, or hip brought on by ambulation:    Pain in feet at night that wakes you up from your sleep:     Blood clot in your veins:    Leg swelling:         Pulmonary    Oxygen at home:    Productive cough:     Wheezing:         Neurologic    Sudden weakness in arms or legs:     Sudden numbness in arms or legs:     Sudden onset of difficulty speaking or slurred speech:    Temporary loss of vision in one eye:     Problems with dizziness:         Gastrointestinal    Blood in stool:     Vomited blood:         Genitourinary    Burning when urinating:     Blood in urine:        Psychiatric    Major depression:         Hematologic    Bleeding problems:    Problems with blood clotting too easily:  Skin    Rashes or ulcers: x Fungus toenails bilaterally      Constitutional    Fever or chills:      PHYSICAL EXAMINATION:  Vitals:   01/10/17 1242 01/10/17 1244  BP: 125/79 116/72  Pulse: 90   Resp: 20   Temp: 98.7 F (37.1 C)    Body mass index is 31.6 kg/m.  General:  WDWN in NAD; vital signs documented above Gait: Not observed HENT: WNL, normocephalic Pulmonary: normal non-labored breathing , without Rales, rhonchi,  wheezing Cardiac: regular HR, without  Murmurs, rubs or gallops; without carotid bruits Abdomen: soft, NT, no masses Skin: without rashes Vascular Exam/Pulses:  Right Left  Radial 2+ (normal) absent    Ulnar 1+ (weak) 2+ (normal)  Popliteal Unable to palpate  Unable to palpate   DP Unable to palpate  2+ (normal)  PT 2+ (normal) Unable to palpate    Extremities: without ischemic changes, without Gangrene , without cellulitis; without open wounds; bilateral feet are warm;  Toe fungus on great and 2nd toe bilaterally Musculoskeletal: no muscle wasting or atrophy  Neurologic: A&O X 3;  No focal weakness or paresthesias are detected Psychiatric:  The pt has Normal affect.   Non-Invasive Vascular Imaging:   Carotid Duplex on 01/10/17: -Dampened right ICA flow suggests a more distal right ICA occlusion/stenosis -bilateral ICA velocities suggest 1-39% stenosis with atypical flow pattern as above -no significant change from previous exam  Pt meds includes: Statin:  Yes.   Beta Blocker:  Yes.   Aspirin:  Yes.   ACEI:  No. ARB:  No. CCB use:  No Other Antiplatelet/Anticoagulant:  No   ASSESSMENT/PLAN:: 66 y.o. male with carotid artery disease with hx of right hemispheric CVA in 2007 with no residual   -pt doing well from carotid disease and remains asymptomatic.  Duplex compared with last year discussed with Dr. Donnetta Hutching.  Pt does have low EDV on the right suggesting a more distal RICA occlusion stenosis and there is nothing different to be done.  The study is not significantly different from last year.   -pt remains asymptomatic.  Will have him f/u in one year with repeat duplex.  He will contact 911 or present to the hospital if becomes symptomatic.  S/s were discussed with he and his wife.  -pt continues to complain of SOB and has f/u appt with pulmonology in June.  He has not followed up with Dr. Ellyn Hack in over a year.  Last appt in Epic is 2015.  We will make appt with him to follow up with given his SOB.    -Discussed with pt about a healthier diet for weight loss and lowering his risk for diabetes and increase his activity as he can tolerate.      Leontine Locket, PA-C [facility] [phone]  Clinic MD:  [place]

## 2017-01-10 NOTE — Telephone Encounter (Signed)
I called Dr.Harding's office [facility] to schedule the patient an appt per [facility] Rhyne PA's instructions on blue d/c sheet from today. Dr.Harding can see him tomorrow 01/11/17 at 11:40am at the [facility] location. Ph#[phone] The patient needs to arrive at 11:20am. I left a voicemail message on his mobile ph#. The home phone # did not have VM set up. [email or web address]

## 2017-01-11 ENCOUNTER — Ambulatory Visit: Payer: Medicare Other | Admitting: Cardiology

## 2017-01-11 NOTE — Addendum Note (Signed)
Addended by: Lianne Cure A on: 01/11/2017 09:55 AM   Modules accepted: Orders

## 2017-01-24 ENCOUNTER — Encounter: Payer: Self-pay | Admitting: Cardiology

## 2017-01-24 ENCOUNTER — Ambulatory Visit (INDEPENDENT_AMBULATORY_CARE_PROVIDER_SITE_OTHER): Payer: Medicare Other | Admitting: Cardiology

## 2017-01-24 VITALS — BP 130/68 | HR 93 | Ht 75.0 in | Wt 250.0 lb

## 2017-01-24 DIAGNOSIS — I951 Orthostatic hypotension: Secondary | ICD-10-CM

## 2017-01-24 DIAGNOSIS — I209 Angina pectoris, unspecified: Secondary | ICD-10-CM

## 2017-01-24 DIAGNOSIS — E785 Hyperlipidemia, unspecified: Secondary | ICD-10-CM

## 2017-01-24 DIAGNOSIS — I10 Essential (primary) hypertension: Secondary | ICD-10-CM

## 2017-01-24 DIAGNOSIS — R0609 Other forms of dyspnea: Secondary | ICD-10-CM

## 2017-01-24 DIAGNOSIS — I2510 Atherosclerotic heart disease of native coronary artery without angina pectoris: Secondary | ICD-10-CM | POA: Diagnosis not present

## 2017-01-24 NOTE — Patient Instructions (Signed)
Medication change  Stop metoprolol tartrate   Your physician wants you to follow-up in 12 months with Dr Ellyn Hack. You will receive a reminder letter in the mail two months in advance. If you don't receive a letter, please call our office to schedule the follow-up appointment.   If you need a refill on your cardiac medications before your next appointment, please call your pharmacy.

## 2017-01-24 NOTE — Progress Notes (Signed)
PCP: Ryan Herring, MD  Clinic Note: Chief Complaint  Patient presents with  . Follow-up    CAD-CABG  . Shortness of Breath    Chronic  . Headache    HPI: Ryan [name] is a 66 y.o. male with a PMH below who presents today for Delayed follow-up for coronary artery disease. It is presumed that some of his CAD is related to history of Hodgkin's lymphoma treated with mantle radiation in 1983. Subsequent development right breast cancer in 2012 treated with surgery - Ryan was on chronic tamoxifen for suppression therapy until May 2017. Ryan is followed by a pulmonologist and Ryan [name] for his profound dyspnea which is likely related to radiation pneumonitis.  I first met him back in June 2014 when Ryan presented with unusual chest discomfort and exertional dyspnea. I was concerned that the symptoms and consider this to be consistent with class III angina with chest tightness and dyspnea on exertion. I recommended cardiac catheterization which revealed severe left main disease with proximal RCA disease. Ryan was actually admitted for CT surgery That (Ryan [name]) and Vascular (Ryan [name]) Surgery consultation and underwent bypass surgery (CABG 3: Ryan [name], Ryan [name], Ryan [name]) in July 14. Ryan also has mild aortic and mitral valvular disease and chronic exertional dyspnea. In the past, I have weaned him off beta blocker for chronotropic incompetence, dyspnea and fatigue. However, Ryan presents today on Lopressor once daily. After Ryan finished cardiac Rehabilitation, Ryan stopped exercising and gained significant weight.   Ryan [name] was last seen on November 2015 to follow-up a Myoview stress test and echocardiogram done to evaluate exertional dyspnea & exhaustion.  Unfortunately hasn't lost to follow-up since then. Ryan continues to follow up with vascular surgery for his carotid disease- Ryan was just seen this month after repeat Dopplers that were essentially  stable. This point was actually made at the request of Ryan [name], Ryan [name].  Recent Hospitalizations:   Admitted in March 2017 for acute appendicitis --> appendectomy  Studies Reviewed:   Last echo October 2015: EF 55-60%/ Ao Sclerosis. No RWMA  Myoview October 2015:  LV Wall Motion:  NL LV Function; NL Wall Motion  Interval History: Ryan [name] presents today for prolonged follow-up, somehow lost to follow-up and seems. Ryan says that Ryan really has not had any true cardiac issues in the last couple years but has been dealing more more with his lung issues. Ryan gets short of breath just that any activity, and has minimal energy level whatsoever. Ryan feels like Ryan would do much better. Could be on home oxygen, but apparently is not met criteria for home oxygen treatments. His 6 minute walk is not been adequate to meet the requirement for home O2. Ryan really doesn't no PND or orthopnea, Ryan justwakes up Ryan coughs and is full of phlegm that Ryan just can't clear. Ryan is not having any chest tightness or pressure when Ryan exerts himself, Ryan just gets short of breath and fatigue. Ryan [name] no sign of arrhythmia with rapid irregular heartbeats palpitations. No sig. Near syncope. No TIA or amaurosis fugax symptoms. By reading his oncology notes, the plan was returned to go on Ryan [name] after stopping his tamoxifen, but this had not been started and Ryan is simply on aspirin 325 mg. Ryan is not had any bleeding with no melena , hematochezia, hematuria or nosebleeds.   No syncope/near syncope.  No claudication.  ROS: A comprehensive was performed. Review of Systems  Constitutional:  Positive for malaise/fatigue (Just no energy).  HENT: Negative for congestion.   Respiratory: Positive for cough (Morning cough productive of thick white phlegm) and shortness of breath (Chronic, worse with exertion. No real change). Negative for wheezing.   Cardiovascular:       Per history of present illness  Gastrointestinal: Negative for  blood in stool, constipation, heartburn and melena.  Genitourinary: Negative for hematuria.  Musculoskeletal: Positive for joint pain. Negative for falls and myalgias.  Neurological: Positive for headaches. Negative for dizziness and focal weakness.  Endo/Heme/Allergies: Does not bruise/bleed easily.  Psychiatric/Behavioral: Negative for depression (Not true depression symptoms, although Ryan probably needs some criteria.) and memory loss. The patient is nervous/anxious (Ryan does take when necessary Xanax may be once/month). The patient does not have insomnia.   All other systems reviewed and are negative.   Past Medical History:  Diagnosis Date  . Adie's pupil   . Anemia 07/14/2013  . Borderline diabetes   . CAD (coronary artery disease), native coronary artery 04/09/2013   Dist LM stenosis 70-80% (at trifurcation into LAD, CX, & 2 Ramus branches, 70% mid RCA);; b) Myoview 08/06/14: Low Risk, no Ischemia/infarction  . Depression 1983   "after I was dx'd w/CA; now just [name] in spells"  (12/22/2015)  . Essential hypertension   . Exertional shortness of breath   . Hodgkin's lymphoma ([facility]) 1983  . Hypercholesterolemia   . Hypothyroidism   . Invasive ductal carcinoma of breast, stage 2 ([facility]) 04/27/2011   chemo, mastectomy  . Left carotid artery partial [name] 04/27/2011   CAROTIC DOPPLER 11/2013: < 40% R ICA stenosis, distal waveforms damped - suggests probably intracranial occlusoin, < 28% LICA, normal vertebrals -- no change  . Mild mitral stenosis by prior echocardiogram 08/06/2014    Noted on echocardiogram  . Restless leg syndrome   . Right carotid artery [name] 04/27/2011  . S/P CABG x 3 04/09/2013   Ryan [name], fLRAD-RI, Ryan [name]; Echo 10/29/'15:  mild Conc LVH, no WMA, Gr 1 DD, Mod AoV Sclerosis w/ Mod AI, ~Mild-Mod MS   . Stroke Ryan [name])    2007  . Thyroid disease     Past Surgical History:  Procedure Laterality Date  . BREAST BIOPSY Right   . CARDIAC CATHETERIZATION  2014  .  CATARACT EXTRACTION W/PHACO Right 06/21/2015   Procedure: CATARACT EXTRACTION PHACO AND INTRAOCULAR LENS PLACEMENT (Ryan [name]);  Surgeon: Ryan Branch, MD;  Location: [facility];  Service: Ophthalmology;  Laterality: Right;  CDE: 7.05  . CATARACT EXTRACTION W/PHACO Left 07/01/2015   Procedure: CATARACT EXTRACTION PHACO AND INTRAOCULAR LENS PLACEMENT (IOC);  Surgeon: Ryan Branch, MD;  Location: [facility];  Service: Ophthalmology;  Laterality: Left;  CDE: 7.49  . COLONOSCOPY [name] 05/06/2015   Procedure: COLONOSCOPY;  Surgeon: Rogene Houston, MD;  Location: [facility];  Service: Endoscopy;  Laterality: [name];  930  . CORONARY ARTERY BYPASS GRAFT [name] 04/08/2013   Procedure: CORONARY ARTERY BYPASS GRAFTING (CABG);  Surgeon: Ivin Poot, MD;  Location: [facility];  Service: Open Heart Surgery;  Laterality: [name];  Times 3 using left internal mammary artery; endoscopically harvested right saphenous vein and left radial artery  . INTRAOPERATIVE TRANSESOPHAGEAL ECHOCARDIOGRAM [name] 04/08/2013   Procedure: INTRAOPERATIVE TRANSESOPHAGEAL ECHOCARDIOGRAM;  Surgeon: Ivin Poot, MD;  Location: [facility];  Service: Open Heart Surgery;  Laterality: [name];  . LAPAROSCOPIC APPENDECTOMY [name] 12/23/2015   Procedure: APPENDECTOMY LAPAROSCOPIC;  Surgeon: Donnie Mesa, MD;  Location: [facility];  Service: General;  Laterality: [name];  . LEFT  HEART CATHETERIZATION WITH CORONARY ANGIOGRAM [name] 04/07/2013   Procedure: LEFT HEART CATHETERIZATION WITH CORONARY ANGIOGRAM;  Surgeon: Leonie Man, MD;  Location: [facility] CATH [facility];  Service: Cardiovascular;  Laterality: [name];  . LYMPH NODE BIOPSY Left 1983   neck  . MASTECTOMY Right    w/axillary lymph node dissection  . NM TREADMILL MYOVIEW LTD  08/06/2014   Exercise 5:07 min, 6.3 METS; symptoms noted or extreme dyspnea and lightheadedness with some chest tightness. No EKG changes. No ischemia or infarction was normal EF.  Marland Kitchen RADIAL ARTERY HARVEST Left 04/08/2013   Procedure: RADIAL ARTERY HARVEST;  Surgeon: Ivin Poot, MD;  Location: [facility];  Service: Open Heart Surgery;  Laterality: Left;  . TONSILLECTOMY  1974  . TRANSTHORACIC ECHOCARDIOGRAM  08/06/2014    Normal EF 55-60%. Gr 1 DD - increased LVEDP. Moderate aortic valve thickening suggestive of AoV Sclerosis but not stenosis. Mild AI; MIld MS - mean gradient of 7 mmHg & HR of 122 bpm.    Current Meds  Medication Sig  . acetaminophen (TYLENOL) 500 MG tablet Take 500 mg by mouth every 6 (six) hours as needed for mild pain.   . Albuterol Sulfate (PROAIR RESPICLICK) 102 (90 Base) MCG/ACT AEPB Inhale 1 puff into the lungs 4 (four) times daily as needed (shortness of breath.).  Marland Kitchen ALPRAZolam (XANAX) 1 MG tablet Take 0.5 mg by mouth at bedtime as needed for anxiety.   Marland Kitchen aspirin EC 81 MG tablet Take 325 mg by mouth daily.   . Calcium Carbonate-Vit D-Min (CALCIUM 1200 PO) Take 1 tablet by mouth daily.   . Cholecalciferol (VITAMIN D-3 PO) Take 1 tablet by mouth daily.   . citalopram (CELEXA) 20 MG tablet Take 40 mg by mouth every evening.   . Coenzyme Q10 (CO Q10) 100 MG CAPS Take by mouth.  . diphenhydrAMINE (BENADRYL) 25 MG tablet Take 25 mg by mouth daily as needed for allergies.  . Garlic 725 MG CAPS Take 500 mg by mouth daily.  . Ginkgo Biloba 60 MG CAPS Take 1 capsule by mouth daily.  Marland Kitchen levothyroxine (SYNTHROID, LEVOTHROID) 75 MCG tablet Take 75 mcg by mouth daily.   . nitroGLYCERIN (NITROSTAT) 0.4 MG SL tablet Place 1 tablet (0.4 mg total) under the tongue every 5 (five) minutes as needed for chest pain.  Marland Kitchen rOPINIRole (REQUIP) 0.25 MG tablet Take 0.25 mg by mouth at bedtime.  . simvastatin (ZOCOR) 40 MG tablet Take 40 mg by mouth at bedtime.   Marland Kitchen Umeclidinium-Vilanterol (ANORO ELLIPTA) 62.5-25 MCG/INH AEPB Inhale 1 puff into the lungs daily.  . [DISCONTINUED] metoprolol tartrate (LOPRESSOR) 25 MG tablet Take 37.5 mg by mouth at bedtime.    No Known Allergies  Social History   Social History  . Marital status: Married    Spouse name: [name]  .  Number of children: 2  . Years of education: [name]   Social History Main Topics  . Smoking status: Former Smoker    Packs/day: 1.00    Years: 15.00    Types: Cigarettes    Quit date: 06/09/1982  . Smokeless tobacco: Never Used  . Alcohol use No  . Drug use: No  . Sexual activity: No   Other Topics Concern  . None   Social History Narrative   Married. Former smoker who quit in 1983.   No routine exercise - gained ~40lb post CABG.    family history includes Alzheimer's disease in his mother; Breast cancer in his paternal aunt; Diabetes in  his mother, paternal grandfather, and paternal grandmother; Hyperlipidemia in his mother; Hypertension in his father and mother; Ovarian cancer in his paternal aunt; Stroke in his mother.  Wt Readings from Last 3 Encounters:  01/24/17 250 lb (113.4 kg)  01/10/17 252 lb 12.8 oz (114.7 kg)  07/28/16 251 lb (113.9 kg)  Weights are stable from 2015. In 2014 Ryan was 232 pounds.  PHYSICAL EXAM BP 130/68   Pulse 93   Ht 6' 3"  (1.905 m)   Wt 250 lb (113.4 kg)   BMI 31.25 kg/m  General appearance: alert, cooperative, appears stated age, no distress and mildly obese. Otherwise well-groomed well-nourished. HEENT: [place]/AT, EOMI, MMM, anicteric sclera Neck: no adenopathy, + L carotid bruit and no JVD Lungs: distant breath sounds with diffuse interstitial sounds. Mild rhonchi, but no rales or wheezing. Nonlabored. Heart: regular rate and rhythm, S1 normal & split S2, very soft SEM at the RUSB but otherwise no significant M/R/G. Abdomen: soft, non-tender; bowel sounds normal; no masses,  no organomegaly; no HJR Extremities: extremities normal, atraumatic, no cyanosis, and edema - trivial Pulses: 2+ and symmetric;  Skin: mobility and turgor normal, no evidence of bleeding or bruising, no lesions noted and temperature normal or  Neurologic: Mental status: Alert & oriented x3 , thought content appropriate; pleasant mood and affect    Adult ECG Report  Rate:  93 ;  Rhythm: normal sinus rhythm and RBBB with left axis deviation (-44). Otherwise normal durations and intervals.;   Narrative Interpretation: Relatively stable EKG   Other studies Reviewed: Additional studies/ records that were reviewed today include:  Recent Labs:  Lipids followed by PCP. Lab Results  Component Value Date   CREATININE 1.61 (H) 07/28/2016   BUN 11 07/28/2016   NA 139 07/28/2016   K 4.2 07/28/2016   CL 107 07/28/2016   CO2 25 07/28/2016    ASSESSMENT / PLAN: Problem List Items Addressed This Visit    Angina, class III - cath 04/07/13- 3V CAD    Ryan is not really having recurrent anginal symptoms. Ryan is having exertional dyspnea, but none of the chest tightness. Fatigue is his major symptom. The symptoms were evaluated with a negative stress test in 2015. Ryan would be due for a repeat stress test next year for surveillance.      Coronary atherosclerosis of native coronary artery - multivessel CAD, status post CABG x3 (Ryan [name], Left Radial-RI, Ryan [name]) - Primary (Chronic)    Almost 4 years out from his CABG, and about 2 1/35fyears out from last Myoview stress test. His symptoms really don't seem to change. Ryan still has significant dyspnea, but we evaluated this already with an echocardiogram and Myoview in the past. Myoview did not show any evidence ischemia, and echocardiogram was essentially normal.  There is some borderline mitral stenosis, but no change really on exam although I did hear a very soft murmur - that sounds more systolic ejection then holosystolic. If the murmur becomes latter, we would recheck an echocardiogram.  Plan for now is to continue aspirin and statin. I'm actually concerned about his orthostatic dizziness and fatigue. I think we can probably discontinue his metoprolol. Ryan is actually only taking 12.540mat night. In the past and had some chronotropic incompetence, but his heart rate is a little bit high today. We need to monitor how Ryan does off  of beta blocker. Hopefully if this is in shipping at all to any fatigue, Ryan will does improvement.  Relevant Orders   EKG 12-Lead (Completed)   Essential hypertension (Chronic)    Plan for now is to actually fully stop his beta blocker. His blood pressure actually is well-controlled and don't think this dose of metoprolol as doing anything for it. Well, we may want to consider ARB or amlodipine.      Exertional dyspnea (Chronic)    Still remains multifactorial. Clearly there is a good portion of it being deconditioning. I am removing the beta blocker to avoid any further potential chronotropic incompetence. Ryan seems a filling most of symptoms related to pulmonary issues, and I simply think Ryan probably would benefit from being on home oxygen. Unfortunately, Ryan has not yet met criteria for home oxygen.. I will be nice if Ryan could be started on oxygen and therefore get back to his exercise and lose weight.      Hyperlipidemia with target LDL less than 70 (Chronic)    On moderate dose statin. Monitored by PCP. We have previously tried to stop a statin to see if this helped his fatigue and it did not change.      Orthostatic hypotension   Relevant Orders   EKG 12-Lead (Completed)      Current medicines are reviewed at length with the patient today. (+/- concerns) n/a The following changes have been made: see below  Patient Instructions  Medication change  Stop metoprolol tartrate   Your physician wants you to follow-up in 12 months with Dr Ellyn Hack. You will receive a reminder letter in the mail two months in advance. If you don't receive a letter, please call our office to schedule the follow-up appointment.   If you need a refill on your cardiac medications before your next appointment, please call your pharmacy.    Studies Ordered:   Orders Placed This Encounter  Procedures  . EKG 12-Lead      Glenetta Hew, M.D., M.S. Interventional Cardiologist   Pager #  [phone] Phone # [phone] [address] [place], [place] [address]

## 2017-01-29 NOTE — Assessment & Plan Note (Addendum)
Still remains multifactorial. Clearly there is a good portion of it being deconditioning. I am removing the beta blocker to avoid any further potential chronotropic incompetence. He seems a filling most of symptoms related to pulmonary issues, and I simply think he probably would benefit from being on home oxygen. Unfortunately, he has not yet met criteria for home oxygen.. I will be nice if he could be started on oxygen and therefore get back to his exercise and lose weight.

## 2017-01-29 NOTE — Assessment & Plan Note (Signed)
He is not really having recurrent anginal symptoms. He is having exertional dyspnea, but none of the chest tightness. Fatigue is his major symptom. The symptoms were evaluated with a negative stress test in 2015. He would be due for a repeat stress test next year for surveillance.

## 2017-01-29 NOTE — Assessment & Plan Note (Signed)
Almost 4 years out from his CABG, and about 2 1/84f years out from last Myoview stress test. His symptoms really don't seem to change. He still has significant dyspnea, but we evaluated this already with an echocardiogram and Myoview in the past. Myoview did not show any evidence ischemia, and echocardiogram was essentially normal.  There is some borderline mitral stenosis, but no change really on exam although I did hear a very soft murmur - that sounds more systolic ejection then holosystolic. If the murmur becomes latter, we would recheck an echocardiogram.  Plan for now is to continue aspirin and statin. I'm actually concerned about his orthostatic dizziness and fatigue. I think we can probably discontinue his metoprolol. He is actually only taking 12.5mg  at night. In the past and had some chronotropic incompetence, but his heart rate is a little bit high today. We need to monitor how he does off of beta blocker. Hopefully if this is in shipping at all to any fatigue, he will does improvement.

## 2017-01-29 NOTE — Assessment & Plan Note (Signed)
On moderate dose statin. Monitored by PCP. We have previously tried to stop a statin to see if this helped his fatigue and it did not change.

## 2017-01-29 NOTE — Assessment & Plan Note (Signed)
Plan for now is to actually fully stop his beta blocker. His blood pressure actually is well-controlled and don't think this dose of metoprolol as doing anything for it. Well, we may want to consider ARB or amlodipine.

## 2017-03-15 DIAGNOSIS — G4733 Obstructive sleep apnea (adult) (pediatric): Secondary | ICD-10-CM | POA: Diagnosis not present

## 2017-03-15 DIAGNOSIS — I10 Essential (primary) hypertension: Secondary | ICD-10-CM | POA: Diagnosis not present

## 2017-03-15 DIAGNOSIS — J449 Chronic obstructive pulmonary disease, unspecified: Secondary | ICD-10-CM | POA: Diagnosis not present

## 2017-03-15 DIAGNOSIS — J700 Acute pulmonary manifestations due to radiation: Secondary | ICD-10-CM | POA: Diagnosis not present

## 2017-03-26 DIAGNOSIS — E6609 Other obesity due to excess calories: Secondary | ICD-10-CM | POA: Diagnosis not present

## 2017-03-26 DIAGNOSIS — Z6830 Body mass index (BMI) 30.0-30.9, adult: Secondary | ICD-10-CM | POA: Diagnosis not present

## 2017-03-26 DIAGNOSIS — B029 Zoster without complications: Secondary | ICD-10-CM | POA: Diagnosis not present

## 2017-04-04 ENCOUNTER — Ambulatory Visit: Payer: Medicare Other | Attending: Pulmonary Disease | Admitting: Neurology

## 2017-04-04 DIAGNOSIS — G4733 Obstructive sleep apnea (adult) (pediatric): Secondary | ICD-10-CM | POA: Diagnosis not present

## 2017-04-07 NOTE — Procedures (Signed)
[place] A. Merlene Laughter, MD     [email or web address]             NOCTURNAL POLYSOMNOGRAPHY   LOCATION: [facility]  Patient Name: Ryan [name], Ryan [name] Date: 04/04/2017 Gender: Male D.O.B: November 27, 1950 Age (years): 65 Referring Provider: Lennox Solders Height (inches): 75 Interpreting Physician: [name] MD, ABSM Weight (lbs): 248 RPSGT: Peak, Robert BMI: 31 MRN: [id number] Neck Size: 15.50 CLINICAL INFORMATION Sleep Study Type: NPSG  Indication for sleep study: OSA  Epworth Sleepiness Score: 14  SLEEP STUDY TECHNIQUE As per the AASM Manual for the Scoring of Sleep and Associated Events v2.3 (April 2016) with a hypopnea requiring 4% desaturations.  The channels recorded and monitored were frontal, central and occipital EEG, electrooculogram (EOG), submentalis EMG (chin), nasal and oral airflow, thoracic and abdominal wall motion, anterior tibialis EMG, snore microphone, electrocardiogram, and pulse oximetry.  MEDICATIONS Medications self-administered by patient taken the night of the study : [name]  Current Outpatient Prescriptions:  .  acetaminophen (TYLENOL) 500 MG tablet, Take 500 mg by mouth every 6 (six) hours as needed for mild pain. , Disp: , Rfl:  .  Albuterol Sulfate (PROAIR RESPICLICK) 902 (90 Base) MCG/ACT AEPB, Inhale 1 puff into the lungs 4 (four) times daily as needed (shortness of breath.)., Disp: , Rfl:  .  ALPRAZolam (XANAX) 1 MG tablet, Take 0.5 mg by mouth at bedtime as needed for anxiety. , Disp: , Rfl:  .  aspirin EC 81 MG tablet, Take 325 mg by mouth daily. , Disp: , Rfl:  .  Calcium Carbonate-Vit D-Min (CALCIUM 1200 PO), Take 1 tablet by mouth daily. , Disp: , Rfl:  .  Cholecalciferol (VITAMIN D-3 PO), Take 1 tablet by mouth daily. , Disp: , Rfl:  .  citalopram (CELEXA) 20 MG tablet, Take 40 mg by mouth every evening. , Disp: , Rfl:  .  Coenzyme Q10 (CO Q10) 100 MG CAPS, Take by mouth., Disp: , Rfl:  .  diphenhydrAMINE  (BENADRYL) 25 MG tablet, Take 25 mg by mouth daily as needed for allergies., Disp: , Rfl:  .  Garlic 409 MG CAPS, Take 500 mg by mouth daily., Disp: , Rfl:  .  Ginkgo Biloba 60 MG CAPS, Take 1 capsule by mouth daily., Disp: , Rfl:  .  levothyroxine (SYNTHROID, LEVOTHROID) 75 MCG tablet, Take 75 mcg by mouth daily. , Disp: , Rfl:  .  nitroGLYCERIN (NITROSTAT) 0.4 MG SL tablet, Place 1 tablet (0.4 mg total) under the tongue every 5 (five) minutes as needed for chest pain., Disp: 25 tablet, Rfl: 3 .  rOPINIRole (REQUIP) 0.25 MG tablet, Take 0.25 mg by mouth at bedtime., Disp: , Rfl:  .  simvastatin (ZOCOR) 40 MG tablet, Take 40 mg by mouth at bedtime. , Disp: , Rfl:  .  Umeclidinium-Vilanterol (ANORO ELLIPTA) 62.5-25 MCG/INH AEPB, Inhale 1 puff into the lungs daily., Disp: , Rfl:    SLEEP ARCHITECTURE The study was initiated at 9:50:50 PM and ended at 4:52:41 AM.  Sleep onset time was 66.7 minutes and the sleep efficiency was 54.6%. The total sleep time was 230.5 minutes.  Stage REM latency was [name] minutes.  The patient spent 27.33% of the night in stage N1 sleep, 72.67% in stage N2 sleep, 0.00% in stage N3 and 0.00% in REM.  Alpha intrusion was absent.  Supine sleep was 20.82%.  RESPIRATORY PARAMETERS The overall apnea/hypopnea index (AHI) was 21.1 per hour. There were 24 total apneas, including 11 obstructive, 13 central and 0  mixed apneas. There were 57 hypopneas and 67 RERAs.  The AHI during Stage REM sleep was [name] per hour.  AHI while supine was 97.5 per hour.  The mean oxygen saturation was 94.18%. The minimum SpO2 during sleep was 84.00%.  snoring was noted during this study.  CARDIAC DATA The 2 lead EKG demonstrated sinus rhythm. The mean heart rate was 75.27 beats per minute. Other EKG findings include: None. LEG MOVEMENT DATA The total PLMS were 253 with a resulting PLMS index of 65.86. Associated arousal with leg movement index was 4.9.  IMPRESSIONS - Moderate  obstructive sleep apnea is documented during this study.A formal CPAP titration study is recommended. - Severe periodic limb movements of sleep occurred during the study. No significant associated arousals. - Abnormal sleep architecture is observed with the fragmented sleep, reduced sleep efficiency, absent slow wave sleep and absent REM sleep.   Delano Metz, MD Diplomate, American Board of Sleep Medicine.    ELECTRONICALLY SIGNED ON:  04/07/2017, 9:16 PM [place] PH: [phone]   FX: [phone] [employer]

## 2017-04-12 DIAGNOSIS — Z125 Encounter for screening for malignant neoplasm of prostate: Secondary | ICD-10-CM | POA: Diagnosis not present

## 2017-04-12 DIAGNOSIS — E063 Autoimmune thyroiditis: Secondary | ICD-10-CM | POA: Diagnosis not present

## 2017-04-12 DIAGNOSIS — Z6830 Body mass index (BMI) 30.0-30.9, adult: Secondary | ICD-10-CM | POA: Diagnosis not present

## 2017-04-12 DIAGNOSIS — I2510 Atherosclerotic heart disease of native coronary artery without angina pectoris: Secondary | ICD-10-CM | POA: Diagnosis not present

## 2017-04-12 DIAGNOSIS — E039 Hypothyroidism, unspecified: Secondary | ICD-10-CM | POA: Diagnosis not present

## 2017-04-12 DIAGNOSIS — Z1389 Encounter for screening for other disorder: Secondary | ICD-10-CM | POA: Diagnosis not present

## 2017-04-12 DIAGNOSIS — N183 Chronic kidney disease, stage 3 (moderate): Secondary | ICD-10-CM | POA: Diagnosis not present

## 2017-04-12 DIAGNOSIS — E6609 Other obesity due to excess calories: Secondary | ICD-10-CM | POA: Diagnosis not present

## 2017-04-12 DIAGNOSIS — Z8673 Personal history of transient ischemic attack (TIA), and cerebral infarction without residual deficits: Secondary | ICD-10-CM | POA: Diagnosis not present

## 2017-04-12 DIAGNOSIS — R7309 Other abnormal glucose: Secondary | ICD-10-CM | POA: Diagnosis not present

## 2017-04-12 DIAGNOSIS — Z0000 Encounter for general adult medical examination without abnormal findings: Secondary | ICD-10-CM | POA: Diagnosis not present

## 2017-04-12 DIAGNOSIS — E782 Mixed hyperlipidemia: Secondary | ICD-10-CM | POA: Diagnosis not present

## 2017-04-26 DIAGNOSIS — I10 Essential (primary) hypertension: Secondary | ICD-10-CM | POA: Diagnosis not present

## 2017-04-26 DIAGNOSIS — E119 Type 2 diabetes mellitus without complications: Secondary | ICD-10-CM | POA: Diagnosis not present

## 2017-04-26 DIAGNOSIS — J700 Acute pulmonary manifestations due to radiation: Secondary | ICD-10-CM | POA: Diagnosis not present

## 2017-04-26 DIAGNOSIS — G4733 Obstructive sleep apnea (adult) (pediatric): Secondary | ICD-10-CM | POA: Diagnosis not present

## 2017-05-11 ENCOUNTER — Other Ambulatory Visit (HOSPITAL_BASED_OUTPATIENT_CLINIC_OR_DEPARTMENT_OTHER): Payer: Self-pay

## 2017-05-11 DIAGNOSIS — G4730 Sleep apnea, unspecified: Secondary | ICD-10-CM

## 2017-05-16 DIAGNOSIS — T1512XA Foreign body in conjunctival sac, left eye, initial encounter: Secondary | ICD-10-CM | POA: Diagnosis not present

## 2017-06-01 ENCOUNTER — Ambulatory Visit: Payer: Medicare Other | Attending: Pulmonary Disease | Admitting: Neurology

## 2017-06-01 DIAGNOSIS — G4733 Obstructive sleep apnea (adult) (pediatric): Secondary | ICD-10-CM | POA: Diagnosis not present

## 2017-06-01 DIAGNOSIS — G4730 Sleep apnea, unspecified: Secondary | ICD-10-CM

## 2017-06-02 NOTE — Procedures (Signed)
[place] A. Merlene Laughter, MD     [email or web address]             NOCTURNAL POLYSOMNOGRAPHY   LOCATION: [facility]   Patient Name: Ryan [name], Ryan [name] Date: 06/01/2017 Gender: Male D.O.B: 1951/04/11 Age (years): 65 Referring Provider: Sinda Du Height (inches): 75 Interpreting Physician: [name] MD, ABSM Weight (lbs): 248 RPSGT: Peak, Robert BMI: 31 MRN: [id number] Neck Size: 15.50 CLINICAL INFORMATION The patient is referred for a CPAP titration to treat sleep apnea.  Date of NPSG, Split Night or HST:  SLEEP STUDY TECHNIQUE As per the AASM Manual for the Scoring of Sleep and Associated Events v2.3 (April 2016) with a hypopnea requiring 4% desaturations.  The channels recorded and monitored were frontal, central and occipital EEG, electrooculogram (EOG), submentalis EMG (chin), nasal and oral airflow, thoracic and abdominal wall motion, anterior tibialis EMG, snore microphone, electrocardiogram, and pulse oximetry. Continuous positive airway pressure (CPAP) was initiated at the beginning of the study and titrated to treat sleep-disordered breathing.  MEDICATIONS Medications self-administered by patient taken the night of the study : [name]  Current Outpatient Prescriptions:  .  acetaminophen (TYLENOL) 500 MG tablet, Take 500 mg by mouth every 6 (six) hours as needed for mild pain. , Disp: , Rfl:  .  Albuterol Sulfate (PROAIR RESPICLICK) 948 (90 Base) MCG/ACT AEPB, Inhale 1 puff into the lungs 4 (four) times daily as needed (shortness of breath.)., Disp: , Rfl:  .  ALPRAZolam (XANAX) 1 MG tablet, Take 0.5 mg by mouth at bedtime as needed for anxiety. , Disp: , Rfl:  .  aspirin EC 81 MG tablet, Take 325 mg by mouth daily. , Disp: , Rfl:  .  Calcium Carbonate-Vit D-Min (CALCIUM 1200 PO), Take 1 tablet by mouth daily. , Disp: , Rfl:  .  Cholecalciferol (VITAMIN D-3 PO), Take 1 tablet by mouth daily. , Disp: , Rfl:  .  citalopram (CELEXA) 20 MG tablet,  Take 40 mg by mouth every evening. , Disp: , Rfl:  .  Coenzyme Q10 (CO Q10) 100 MG CAPS, Take by mouth., Disp: , Rfl:  .  diphenhydrAMINE (BENADRYL) 25 MG tablet, Take 25 mg by mouth daily as needed for allergies., Disp: , Rfl:  .  Garlic 546 MG CAPS, Take 500 mg by mouth daily., Disp: , Rfl:  .  Ginkgo Biloba 60 MG CAPS, Take 1 capsule by mouth daily., Disp: , Rfl:  .  levothyroxine (SYNTHROID, LEVOTHROID) 75 MCG tablet, Take 75 mcg by mouth daily. , Disp: , Rfl:  .  nitroGLYCERIN (NITROSTAT) 0.4 MG SL tablet, Place 1 tablet (0.4 mg total) under the tongue every 5 (five) minutes as needed for chest pain., Disp: 25 tablet, Rfl: 3 .  rOPINIRole (REQUIP) 0.25 MG tablet, Take 0.25 mg by mouth at bedtime., Disp: , Rfl:  .  simvastatin (ZOCOR) 40 MG tablet, Take 40 mg by mouth at bedtime. , Disp: , Rfl:  .  Umeclidinium-Vilanterol (ANORO ELLIPTA) 62.5-25 MCG/INH AEPB, Inhale 1 puff into the lungs daily., Disp: , Rfl:    TECHNICIAN COMMENTS Comments added by technician: PATIENT COMES TO SLEEP CENTER FOR CPAP TITRATION. PATIENT RESPONDED WELL TO CPAP PRESSURE. PATIENT BEGAN WITH NASAL PILLOW MASK, HOWEVER, DUE TO ORAL BREATHING A FULL FACE MASK WAS APPLIED. OPTIMAL FIT WAS ACHIEVED WITH FULL FACE WHILE IN LATERAL POSITION. WHILE IN SUPINE POSITION, PATIENTS MOUTH OPEN AND CAUSE A VERY HIGH MASK LEAK. PATIENT DID NOT HAVE DENTURES. STAGES I AND II WERE OBSERVED. FREQUENT SPONTANEOUS AROUSALS WERE  NOTED. HIGHER PRESSURES ONLY MADE MASK LEAK WORSE IN SUPINE POSITION. TECH ADJUST MASK SEVERAL TIMES WITHOUT ANY CHANGE IN LEAK. PATIENT WILL NEED A CHIN STRAP, DENTURES IN WHILE SLEEPING, OR POSSIBLY AN OVERSIZED MASK AT ACHIEVE OPTIMAL FIT. ECG SHOWED NORMAL SINUS RHYTHM. NO LEG MOVEMENTS WERE NOTED.  Comments added by scorer: [name] RESPIRATORY PARAMETERS Optimal PAP Pressure (cm): 10 AHI at Optimal Pressure (/hr): 0.0 Overall Minimal O2 (%): 84.00 Supine % at Optimal Pressure (%): 0 Minimal O2 at Optimal Pressure  (%): 84.0   SLEEP ARCHITECTURE The study was initiated at 9:36:31 PM and ended at 4:47:44 AM.  Sleep onset time was 45.2 minutes and the sleep efficiency was 57.0%. The total sleep time was 246.0 minutes.  The patient spent 28.86% of the night in stage N1 sleep, 71.14% in stage N2 sleep, 0.00% in stage N3 and 0.00% in REM.Stage REM latency was [name] minutes  Wake after sleep onset was 140.0. Alpha intrusion was absent. Supine sleep was 4.27%.  CARDIAC DATA The 2 lead EKG demonstrated sinus rhythm. The mean heart rate was 76.77 beats per minute. Other EKG findings include: PVCs. LEG MOVEMENT DATA The total Periodic Limb Movements of Sleep (PLMS) were 0. The PLMS index was 0.00. A PLMS index of <15 is considered normal in adults.  IMPRESSIONS - The optimal CPAP pressure is 10 cm of water. - Abnormal sleep architecture is noted with reduced sleep efficiency, absent slow-wave sleep and a absent REM sleep.  Delano Metz, MD Diplomate, American Board of Sleep Medicine.  ELECTRONICALLY SIGNED ON:  06/02/2017, 4:06 PM [place] PH: [phone]   FX: [phone] ACCREDITED BY THE AMERICAN ACADEMY OF SLEEP MEDICINE

## 2017-07-25 DIAGNOSIS — Z23 Encounter for immunization: Secondary | ICD-10-CM | POA: Diagnosis not present

## 2017-07-27 ENCOUNTER — Encounter (HOSPITAL_COMMUNITY): Payer: Medicare Other

## 2017-07-27 ENCOUNTER — Encounter (HOSPITAL_COMMUNITY): Payer: Self-pay | Admitting: Oncology

## 2017-07-27 ENCOUNTER — Encounter (HOSPITAL_COMMUNITY): Payer: Medicare Other | Attending: Oncology | Admitting: Oncology

## 2017-07-27 VITALS — BP 140/93 | HR 95 | Temp 98.0°F | Resp 18 | Wt 239.4 lb

## 2017-07-27 DIAGNOSIS — C50921 Malignant neoplasm of unspecified site of right male breast: Secondary | ICD-10-CM | POA: Diagnosis not present

## 2017-07-27 DIAGNOSIS — Z923 Personal history of irradiation: Secondary | ICD-10-CM | POA: Insufficient documentation

## 2017-07-27 DIAGNOSIS — Z951 Presence of aortocoronary bypass graft: Secondary | ICD-10-CM | POA: Diagnosis not present

## 2017-07-27 DIAGNOSIS — I10 Essential (primary) hypertension: Secondary | ICD-10-CM | POA: Insufficient documentation

## 2017-07-27 DIAGNOSIS — R0602 Shortness of breath: Secondary | ICD-10-CM

## 2017-07-27 DIAGNOSIS — Z8572 Personal history of non-Hodgkin lymphomas: Secondary | ICD-10-CM | POA: Diagnosis not present

## 2017-07-27 DIAGNOSIS — Z9011 Acquired absence of right breast and nipple: Secondary | ICD-10-CM | POA: Insufficient documentation

## 2017-07-27 DIAGNOSIS — Z853 Personal history of malignant neoplasm of breast: Secondary | ICD-10-CM

## 2017-07-27 DIAGNOSIS — Z8249 Family history of ischemic heart disease and other diseases of the circulatory system: Secondary | ICD-10-CM | POA: Diagnosis not present

## 2017-07-27 DIAGNOSIS — E7800 Pure hypercholesterolemia, unspecified: Secondary | ICD-10-CM | POA: Diagnosis not present

## 2017-07-27 DIAGNOSIS — Z170 Estrogen receptor positive status [ER+]: Secondary | ICD-10-CM | POA: Insufficient documentation

## 2017-07-27 DIAGNOSIS — G2581 Restless legs syndrome: Secondary | ICD-10-CM | POA: Diagnosis not present

## 2017-07-27 DIAGNOSIS — Z1502 Genetic susceptibility to malignant neoplasm of ovary: Secondary | ICD-10-CM

## 2017-07-27 DIAGNOSIS — C8190 Hodgkin lymphoma, unspecified, unspecified site: Secondary | ICD-10-CM | POA: Diagnosis not present

## 2017-07-27 DIAGNOSIS — Z1509 Genetic susceptibility to other malignant neoplasm: Secondary | ICD-10-CM

## 2017-07-27 DIAGNOSIS — C50911 Malignant neoplasm of unspecified site of right female breast: Secondary | ICD-10-CM

## 2017-07-27 DIAGNOSIS — Z9221 Personal history of antineoplastic chemotherapy: Secondary | ICD-10-CM | POA: Diagnosis not present

## 2017-07-27 DIAGNOSIS — C50919 Malignant neoplasm of unspecified site of unspecified female breast: Secondary | ICD-10-CM

## 2017-07-27 DIAGNOSIS — Z833 Family history of diabetes mellitus: Secondary | ICD-10-CM | POA: Insufficient documentation

## 2017-07-27 DIAGNOSIS — Z8673 Personal history of transient ischemic attack (TIA), and cerebral infarction without residual deficits: Secondary | ICD-10-CM | POA: Diagnosis not present

## 2017-07-27 DIAGNOSIS — Z823 Family history of stroke: Secondary | ICD-10-CM | POA: Insufficient documentation

## 2017-07-27 DIAGNOSIS — G4733 Obstructive sleep apnea (adult) (pediatric): Secondary | ICD-10-CM

## 2017-07-27 DIAGNOSIS — E039 Hypothyroidism, unspecified: Secondary | ICD-10-CM | POA: Diagnosis not present

## 2017-07-27 DIAGNOSIS — I2510 Atherosclerotic heart disease of native coronary artery without angina pectoris: Secondary | ICD-10-CM | POA: Diagnosis not present

## 2017-07-27 DIAGNOSIS — Z87891 Personal history of nicotine dependence: Secondary | ICD-10-CM | POA: Diagnosis not present

## 2017-07-27 DIAGNOSIS — Z1589 Genetic susceptibility to other disease: Secondary | ICD-10-CM

## 2017-07-27 LAB — COMPREHENSIVE METABOLIC PANEL
ALT: 20 U/L (ref 17–63)
ANION GAP: 8 (ref 5–15)
AST: 27 U/L (ref 15–41)
Albumin: 3.6 g/dL (ref 3.5–5.0)
Alkaline Phosphatase: 58 U/L (ref 38–126)
BILIRUBIN TOTAL: 0.7 mg/dL (ref 0.3–1.2)
BUN: 12 mg/dL (ref 6–20)
CALCIUM: 8.7 mg/dL — AB (ref 8.9–10.3)
CO2: 25 mmol/L (ref 22–32)
Chloride: 104 mmol/L (ref 101–111)
Creatinine, Ser: 1.28 mg/dL — ABNORMAL HIGH (ref 0.61–1.24)
GFR calc Af Amer: >60 mL/min (ref >60)
GFR, EST NON AFRICAN AMERICAN: 56 mL/min — AB (ref >60)
Glucose, Bld: 187 mg/dL — ABNORMAL HIGH (ref 65–99)
POTASSIUM: 4.1 mmol/L (ref 3.5–5.1)
Sodium: 137 mmol/L (ref 135–145)
TOTAL PROTEIN: 6.4 g/dL — AB (ref 6.5–8.1)

## 2017-07-27 LAB — CBC WITH DIFFERENTIAL/PLATELET
Basophils Absolute: 0 10*3/uL (ref 0.0–0.1)
Basophils Relative: 1 %
Eosinophils Absolute: 0.3 10*3/uL (ref 0.0–0.7)
Eosinophils Relative: 4 %
HEMATOCRIT: 41.4 % (ref 39.0–52.0)
Hemoglobin: 13.2 g/dL (ref 13.0–17.0)
LYMPHS ABS: 1.4 10*3/uL (ref 0.7–4.0)
LYMPHS PCT: 20 %
MCH: 29.1 pg (ref 26.0–34.0)
MCHC: 31.9 g/dL (ref 30.0–36.0)
MCV: 91.4 fL (ref 78.0–100.0)
MONO ABS: 0.6 10*3/uL (ref 0.1–1.0)
MONOS PCT: 9 %
NEUTROS ABS: 4.7 10*3/uL (ref 1.7–7.7)
Neutrophils Relative %: 66 %
Platelets: 214 10*3/uL (ref 150–400)
RBC: 4.53 MIL/uL (ref 4.22–5.81)
RDW: 13.5 % (ref 11.5–15.5)
WBC: 7.1 10*3/uL (ref 4.0–10.5)

## 2017-07-27 NOTE — Patient Instructions (Signed)
[facility] at [facility] Discharge Instructions  RECOMMENDATIONS MADE BY THE CONSULTANT AND ANY TEST RESULTS WILL BE SENT TO YOUR REFERRING PHYSICIAN.  You saw Dr. Zhou today.  Thank you for choosing [facility] at [facility] to provide your oncology and hematology care.  To afford each patient quality time with our provider, please arrive at least 15 minutes before your scheduled appointment time.    If you have a lab appointment with the Cancer Center please come in thru the  Main Entrance and check in at the main information desk  You need to re-schedule your appointment should you arrive 10 or more minutes late.  We strive to give you quality time with our providers, and arriving late affects you and other patients whose appointments are after yours.  Also, if you no show three or more times for appointments you may be dismissed from the clinic at the providers discretion.     Again, thank you for choosing [facility].  Our hope is that these requests will decrease the amount of time that you wait before being seen by our physicians.       _____________________________________________________________  Should you have questions after your visit to [facility], please contact our office at [phone] between the hours of 8:30 a.m. and 4:30 p.m.  Voicemails left after 4:30 p.m. will not be returned until the following business day.  For prescription refill requests, have your pharmacy contact our office.       Resources For Cancer Patients and their Caregivers ? American Cancer Society: Can assist with transportation, wigs, general needs, runs Look Good Feel Better.        1-888-227-6333 ? Cancer Care: Provides financial assistance, online support groups, medication/co-pay assistance.  1-800-813-HOPE (4673) ? [facility] Assists [place] cancer patients and their families through  emotional , educational and financial support.  336-427-4357 ? [place] DSS Where to apply for food stamps, Medicaid and utility assistance. 336-342-1394 ? [facility]: Transportation to medical appointments. 336-347-2287 ? Social Security Administration: May apply for disability if have a Stage IV cancer. 336-342-7796 1-800-772-1213 ? [place] Aging, Disability and Transit Services: Assists with nutrition, care and transit needs. 336-349-2343  Cancer Center Support Programs: @[email or web address]@ > Cancer Support Group  2nd Tuesday of the month 1pm-2pm, Journey Room  > Creative Journey  3rd Tuesday of the month 1130am-1pm, Journey Room  > Look Good Feel Better  1st Wednesday of the month 10am-12 noon, Journey Room (Call American Cancer Society to register 1-800-395-5775)    

## 2017-07-27 NOTE — Progress Notes (Signed)
Ryan [name] at [place], [place], MD [address] [place] [address]  Stage II cancer the right breast, grade 1, without LV I. ER +96%, PR 4%, Ki-67 marker low at 19%, HER-2/neu nonamplified. Oncotype score was 14. He was placed on tamoxifen 10 mg twice a day which he will take for 5 full years starting 03/08/2011. He is status post right mastectomy in April 2012 at which time he was found to have a 2.1 cm primary, 3 sentinel nodes were negative.  12/14/2010  Initial Diagnosis  Right needle core biopsy at 9 o'clock position positive for invasive ductal carcinoma  01/17/2011  Surgery  Right simple mastectomy showing a 2.1 cm invasive ductal carcinoma, clear marhins, 0/2 lymph nodes. ER 96%, PR 84%, Ki-67 19%, Her2 negative.  03/08/2011 -  Chemotherapy  Tamoxifen x 5 years   Hodgkin's disease-1983  04/27/2011  Initial Diagnosis  Hodgkin's disease-1983    DIAGNOSIS: Invasive ductal carcinoma of right breast, stage 2- 2012 s/p chemo and mastectomy   Staging form: Breast, AJCC 7th Edition     Clinical: Stage IIA (T2, N0, cM0) - Signed by [facility], PA on 04/27/2011   SUMMARY OF ONCOLOGIC HISTORY:   Invasive ductal carcinoma of right breast, stage 2- 2012 s/p chemo and mastectomy   12/14/2010 Initial Diagnosis    Right needle core biopsy at 9 o'clock position positive for invasive ductal carcinoma      01/17/2011 Surgery    Right simple mastectomy showing a 2.1 cm invasive ductal carcinoma, clear marhins, 0/2 lymph nodes.  ER 96%, PR 84%, Ki-67 19%, Her2 negative.      03/08/2011 - 03/09/2016 Chemotherapy    Tamoxifen x 5 years      08/17/2014 Genetic Testing    Patient has genetic testing done forBreast/Ovarian cancer panel through [facility] . 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       08/09/2015 Pathology Results    BCI- Low risk of late  recurrence years 5-10 (3.7%), low likelihood of benefit from extended endocrine therapy, low risk fo overall recurrence years 0-10 (6.6%), and low liklihood of benefit from extended endocrine therapy.       Hodgkin's [id number]   04/27/2011 Initial Diagnosis    Hodgkin's [id number]       CURRENT THERAPY: Tamoxifen  INTERVAL HISTORY: Ryan JOINES 66 y.o. male returns for follow-up of a stage II ER positive, HER-2 negative carcinoma of the right breast. He underwent mastectomy and completed 5 years of tamoxifen. He has a history of Hodgkin lymphoma treated back in the early 1980s. He has carotid disease and coronary artery disease felt to be secondary to his radiation therapy from his Hodgkin disease.  Patient presents today for continued follow-up for his right breast cancer. His left breast mammogram on 09/19/16 was negative. He states he was recently placed on CPAP for obstructive sleep apnea and he states that his energy level has been much improved for the past 2 days since he's been on CPAP. He has not palpated any new breast masses on his left breast or his right chest wall. He states his appetite is great. Has chronic shortness of breath from previous chest radiation. He denies any chest pain, abdominal pain, nausea, vomiting, diarrhea.   MEDICAL HISTORY: Past Medical History:  Diagnosis Date  . Adie's pupil   . Anemia 07/14/2013  . Borderline diabetes   . CAD (  coronary artery disease), native coronary artery 04/09/2013   Dist LM stenosis 70-80% (at trifurcation into LAD, CX, & 2 Ramus branches, 70% mid RCA);; b) Myoview 08/06/14: Low Risk, no Ischemia/infarction  . Depression 1983   "after I was dx'd w/CA; now just comes in spells"  (12/22/2015)  . Essential hypertension   . Exertional shortness of breath   . Hodgkin's lymphoma ([place]) 1983  . Hypercholesterolemia   . Hypothyroidism   . Invasive ductal carcinoma of breast, stage 2 ([place]) 04/27/2011   chemo, mastectomy  .  Left carotid artery partial occlusion 04/27/2011   CAROTIC DOPPLER 11/2013: < 40% R ICA stenosis, distal waveforms damped - suggests probably intracranial occlusoin, < 11% LICA, normal vertebrals -- no change  . Mild mitral stenosis by prior echocardiogram 08/06/2014    Noted on echocardiogram  . Restless leg syndrome   . Right carotid artery occlusion 04/27/2011  . S/P CABG x 3 04/09/2013   LIMA-LAD, fLRAD-RI, SVG-RCA; Echo 10/29/'15:  mild Conc LVH, no WMA, Gr 1 DD, Mod AoV Sclerosis w/ Mod AI, ~Mild-Mod MS   . Stroke [facility])    2007  . Thyroid disease     has Invasive ductal carcinoma of right breast, stage 2- 2012 s/p chemo and mastectomy; Hodgkin's [id number]; Known RICA occlusion (CVA '07) partial LICA stenosis (18-56%) Jan 2014; Angina, class III - cath 04/07/13- 3V CAD; Hyperlipidemia with target LDL less than 70; Heart palpitations; Claudication of calf muscles (HCC); S/P CABG x 3  LIMA-LAD, free LRA to RI, SVG-RCA  - 04/09/13; Obesity (BMI 30-39.9); Dizzy spells; Orthostatic hypotension; Anemia; Coronary atherosclerosis of native coronary artery - multivessel CAD, status post CABG x3 (LIMA-LAD, Left Radial-RI, SVG-RCA); Fatigue; Occlusion and stenosis of carotid artery without mention of cerebral infarction; Known RICA occlusion (CVA '07), partial LICA stenosis (31-49%); Exertional dyspnea; Essential hypertension; Cancer ([place]); Malignant neoplasm of breast associated with mutation in CHEK2 gene ([place]); Genetic testing; and Acute appendicitis on his problem list.     has No Known Allergies.  [name] had no medications administered during this visit.  SURGICAL HISTORY: Past Surgical History:  Procedure Laterality Date  . BREAST BIOPSY Right   . CARDIAC CATHETERIZATION  2014  . CATARACT EXTRACTION W/PHACO Right 06/21/2015   Procedure: CATARACT EXTRACTION PHACO AND INTRAOCULAR LENS PLACEMENT ([place]);  Surgeon: Tonny Branch, MD;  Location: [facility];  Service: Ophthalmology;  Laterality: Right;   CDE: 7.05  . CATARACT EXTRACTION W/PHACO Left 07/01/2015   Procedure: CATARACT EXTRACTION PHACO AND INTRAOCULAR LENS PLACEMENT ([facility]);  Surgeon: Tonny Branch, MD;  Location: [facility];  Service: Ophthalmology;  Laterality: Left;  CDE: 7.49  . COLONOSCOPY [name] 05/06/2015   Procedure: COLONOSCOPY;  Surgeon: Rogene Houston, MD;  [facility];  Service: Endoscopy;  Laterality: [name];  930  . CORONARY ARTERY BYPASS GRAFT [name] 04/08/2013   Procedure: CORONARY ARTERY BYPASS GRAFTING (CABG);  Surgeon: Ivin Poot, MD;  Location: [facility];  Service: Open Heart Surgery;  Laterality: [name];  Times 3 using left internal mammary artery; endoscopically harvested right saphenous vein and left radial artery  . INTRAOPERATIVE TRANSESOPHAGEAL ECHOCARDIOGRAM [name] 04/08/2013   Procedure: INTRAOPERATIVE TRANSESOPHAGEAL ECHOCARDIOGRAM;  Surgeon: Ivin Poot, MD;  Location: [facility];  Service: Open Heart Surgery;  Laterality: [name];  . LAPAROSCOPIC APPENDECTOMY [name] 12/23/2015   Procedure: APPENDECTOMY LAPAROSCOPIC;  Surgeon: Donnie Mesa, MD;  Location: [facility];  Service: General;  Laterality: [name];  . LEFT HEART CATHETERIZATION WITH CORONARY ANGIOGRAM [name] 04/07/2013   Procedure: LEFT  HEART CATHETERIZATION WITH CORONARY ANGIOGRAM;  Surgeon: Leonie Man, MD;  Location: [facility] C[facility] LAB;  Service: Cardiovascular;  Laterality: [name];  . LYMPH NODE BIOPSY Left 1983   neck  . MASTECTOMY Right    w/axillary lymph node dissection  . NM TREADMILL MYOVIEW LTD  08/06/2014   Exercise 5:07 min, 6.3 METS; symptoms noted or extreme dyspnea and lightheadedness with some chest tightness. No EKG changes. No ischemia or infarction was normal EF.  Marland Kitchen RADIAL ARTERY HARVEST Left 04/08/2013   Procedure: RADIAL ARTERY HARVEST;  Surgeon: Ivin Poot, MD;  Location: [facility];  Service: Open Heart Surgery;  Laterality: Left;  . TONSILLECTOMY  1974  . TRANSTHORACIC ECHOCARDIOGRAM  08/06/2014    Normal EF 55-60%. Gr 1 DD - increased LVEDP. Moderate aortic  valve thickening suggestive of AoV Sclerosis but not stenosis. Mild AI; MIld MS - mean gradient of 7 mmHg & HR of 122 bpm.    SOCIAL HISTORY: Social History   Social History  . Marital status: Married    Spouse name: [name]  . Number of children: 2  . Years of education: [name]   Occupational History  . Not on file.   Social History Main Topics  . Smoking status: Former Smoker    Packs/day: 1.00    Years: 15.00    Types: Cigarettes    Quit date: 06/09/1982  . Smokeless tobacco: Never Used  . Alcohol use No  . Drug use: No  . Sexual activity: No   Other Topics Concern  . Not on file   Social History Narrative   Married. Former smoker who quit in 1983.   No routine exercise - gained ~40lb post CABG.    FAMILY HISTORY: Family History  Problem Relation Age of Onset  . Alzheimer's disease Mother   . Stroke Mother   . Hypertension Mother   . Hyperlipidemia Mother   . Diabetes Mother   . Hypertension Father   . Diabetes Paternal Grandmother   . Diabetes Paternal Grandfather   . Breast cancer Paternal Aunt   . Ovarian cancer Paternal Aunt     Review of Systems  Constitutional: Negative for fever, chills, weight loss and malaise/fatigue.  HENT: Negative for congestion, hearing loss, nosebleeds, sore throat and tinnitus.   Eyes: Negative for blurred vision, double vision, pain and discharge.  Respiratory: Negative for cough, hemoptysis, sputum production, POSITIVE for SOB  Cardiovascular: Negative for chest pain, palpitations, claudication, leg swelling and PND.  Gastrointestinal: Negative for heartburn, nausea, vomiting, abdominal pain, diarrhea, constipation, blood in stool and melena.  Genitourinary: Negative for dysuria, urgency, frequency and hematuria.  Musculoskeletal: Negative for myalgias, joint pain and falls.  Skin: Negative for itching and rash.  Neurological: Negative for dizziness, tingling, tremors, sensory change, speech change, focal weakness, seizures, loss  of consciousness, weakness and headaches.  Endo/Heme/Allergies: Does not bruise/bleed easily.  Psychiatric/Behavioral: Negative for depression, suicidal ideas, memory loss and substance abuse. The patient is not nervous/anxious and does not have insomnia.    14 point review of systems was performed and is negative except as detailed under history of present illness and above  PHYSICAL EXAMINATION  ECOG PERFORMANCE STATUS: 0 - Asymptomatic  Vitals:   07/27/17 1033  BP: (!) 140/93  Pulse: 95  Resp: 18  Temp: 98 F (36.7 C)  SpO2: 98%    Physical Exam  Constitutional: He is oriented to person, place, and time and well-developed, well-nourished, and in no distress.  HENT:  Head: Normocephalic and  atraumatic.  Nose: Nose normal.  Mouth/Throat: Oropharynx is clear and moist. No oropharyngeal exudate.  Eyes: Conjunctivae and EOM are normal. Pupils are equal, round, and reactive to light. Right eye exhibits no discharge. Left eye exhibits no discharge. No scleral icterus.  Neck: Normal range of motion. Neck supple. No tracheal deviation present. No thyromegaly present.  Cardiovascular: Normal rate, regular rhythm and normal heart sounds.  Exam reveals no gallop and no friction rub.   No murmur heard. Pulmonary/Chest: Effort normal and breath sounds normal. He has no wheezes. He has no rales.     Abdominal: Soft. Bowel sounds are normal. He exhibits no distension and no mass. There is no tenderness. There is no rebound and no guarding.  Musculoskeletal: Normal range of motion. He exhibits no edema.  Lymphadenopathy:    He has no cervical adenopathy.  Neurological: He is alert and oriented to person, place, and time. He has normal reflexes. No cranial nerve deficit. Gait normal. Coordination normal.  Skin: Skin is warm and dry. No rash noted.  Psychiatric: Mood, memory, affect and judgment normal.  Nursing note and vitals reviewed.   LABORATORY DATA: I have reviewed the data as  listed.  CBC    Component Value Date/Time   WBC 7.1 07/27/2017 1000   RBC 4.53 07/27/2017 1000   HGB 13.2 07/27/2017 1000   HCT 41.4 07/27/2017 1000   PLT 214 07/27/2017 1000   MCV 91.4 07/27/2017 1000   MCH 29.1 07/27/2017 1000   MCHC 31.9 07/27/2017 1000   RDW 13.5 07/27/2017 1000   LYMPHSABS 1.4 07/27/2017 1000   MONOABS 0.6 07/27/2017 1000   EOSABS 0.3 07/27/2017 1000   BASOSABS 0.0 07/27/2017 1000   CMP     Component Value Date/Time   NA 137 07/27/2017 1000   K 4.1 07/27/2017 1000   CL 104 07/27/2017 1000   CO2 25 07/27/2017 1000   GLUCOSE 187 (H) 07/27/2017 1000   BUN 12 07/27/2017 1000   CREATININE 1.28 (H) 07/27/2017 1000   CREATININE 1.45 (H) 07/14/2013 1639   CALCIUM 8.7 (L) 07/27/2017 1000   PROT 6.4 (L) 07/27/2017 1000   ALBUMIN 3.6 07/27/2017 1000   AST 27 07/27/2017 1000   ALT 20 07/27/2017 1000   ALKPHOS 58 07/27/2017 1000   BILITOT 0.7 07/27/2017 1000   GFRNONAA 56 (L) 07/27/2017 1000   GFRAA >60 07/27/2017 1000   Results for Ryan [name], Ryan [name] (MRN [id number]) as of 01/28/2016 11:52  Ref. Range 01/26/2016 11:10  Prothrombin Time Latest Ref Range: 11.6-15.2 seconds 22.4 (H)  INR Latest Ref Range: 0.00-1.49  1.98 (H)     RADIOGRAPHIC STUDIES:  CLINICAL DATA: 66 year old male with prior history of right breast cancer in 2012 post mastectomy. Screening exam on the left breast.  EXAM: DIGITAL DIAGNOSTIC LEFT MAMMOGRAM WITH CAD  COMPARISON: Previous exams.  ACR Breast Density Category b: There are scattered areas of fibroglandular density.  FINDINGS: No suspicious masses or calcifications are seen in the left breast. There is no mammographic evidence of malignancy in the left breast.  Mammographic images were processed with CAD.  IMPRESSION: No mammographic evidence of malignancy in the left breast.  RECOMMENDATION: Screening mammogram in one year.(Code:SM-B-01Y)  I have discussed the findings and recommendations with the  patient. Results were also provided in writing at the conclusion of the visit. If applicable, a reminder letter will be sent to the patient regarding the next appointment.  BI-RADS CATEGORY 1: Negative.   Electronically Signed  By: Everlean Alstrom  M.D.  On: 08/18/2014 14:09     ASSESSMENT and THERAPY PLAN:   Stage II invasive ductal carcinoma of the right breast, ER positive HER-2 negative status post mastectomy and tamoxifen therapy. Tamoxifen was initiated in May 2012-May 2017 CHEK2 mutation Hodgkin lymphoma treated in the early 80s with mantle radiation Coronary artery disease Carotid arterial disease  Clinically NED today on breast exam. Reviewed CBC, CMP. Next left breast mammogram in December 2018 RTC in 1 year for follow up.  NOTE: CHEK2 mutations have been found to be associated with an increased risk of breast and other cancers. The estimated cancer risks vary widely and may be influenced by family history. Women with a CHEK2 deleterious mutation have approximately a 24% (no family history of breast cancer) to 48% (strong family history of breast cancer) lifetime risk of breast cancer and up to a 25% risk of a second breast cancer. Men may have an increased risk for male breast cancer of about 1%. Men and women may have an increased risk of colon cancer (~10% lifetime risk). Other cancers, including ovarian, uterine and thyroid, have been associated with CHEK2 mutations, but no specific risk has been established. According to the NCCN guidelines, women with CHEK2 mutations should consider breast MRI's as a part of regular breast cancer screening. There are no other established management guidelines for individuals with disease causing mutations in CHEK2. However, management options based on other genes associated with high risks of breast cancer (such as the BRCA1 and BRCA2) may be appropriate to follow.   Male Breast and Prostate Management Options We reviewed the  NCCN practice guidelines (v.2.2015) for male breast and prostate management for men at high risk of male breast and prostate cancer because of BRCA1 or BRCA2 mutations:  1. Breast self-exam training and education starting at age 69.  2. Clinical breast exam, every 6-12 months, starting at age 42 years  101. Consider baseline screening mammogram at age 36 with annual mammograms if gynecomastia or parenchymal/glandular breast density on baseline study.  4. Consider prostate cancer screening starting at age 35 years.  No orders of the defined types were placed in this encounter.  All questions were answered. The patient knows to call the clinic with any problems, questions or concerns. We can certainly see the patient much sooner if necessary.   This note was electronically signed. Twana First, MD 07/27/2017

## 2017-07-30 DIAGNOSIS — G2581 Restless legs syndrome: Secondary | ICD-10-CM | POA: Diagnosis not present

## 2017-07-30 DIAGNOSIS — J700 Acute pulmonary manifestations due to radiation: Secondary | ICD-10-CM | POA: Diagnosis not present

## 2017-07-30 DIAGNOSIS — I10 Essential (primary) hypertension: Secondary | ICD-10-CM | POA: Diagnosis not present

## 2017-07-30 DIAGNOSIS — G4733 Obstructive sleep apnea (adult) (pediatric): Secondary | ICD-10-CM | POA: Diagnosis not present

## 2017-09-24 DIAGNOSIS — H26493 Other secondary cataract, bilateral: Secondary | ICD-10-CM | POA: Diagnosis not present

## 2017-10-04 DIAGNOSIS — G2581 Restless legs syndrome: Secondary | ICD-10-CM | POA: Diagnosis not present

## 2017-10-04 DIAGNOSIS — I10 Essential (primary) hypertension: Secondary | ICD-10-CM | POA: Diagnosis not present

## 2017-10-04 DIAGNOSIS — J700 Acute pulmonary manifestations due to radiation: Secondary | ICD-10-CM | POA: Diagnosis not present

## 2017-10-04 DIAGNOSIS — G4733 Obstructive sleep apnea (adult) (pediatric): Secondary | ICD-10-CM | POA: Diagnosis not present

## 2017-11-28 DIAGNOSIS — F419 Anxiety disorder, unspecified: Secondary | ICD-10-CM | POA: Diagnosis not present

## 2017-11-28 DIAGNOSIS — E6609 Other obesity due to excess calories: Secondary | ICD-10-CM | POA: Diagnosis not present

## 2017-11-28 DIAGNOSIS — C50921 Malignant neoplasm of unspecified site of right male breast: Secondary | ICD-10-CM | POA: Diagnosis not present

## 2017-11-28 DIAGNOSIS — E063 Autoimmune thyroiditis: Secondary | ICD-10-CM | POA: Diagnosis not present

## 2017-11-28 DIAGNOSIS — E119 Type 2 diabetes mellitus without complications: Secondary | ICD-10-CM | POA: Diagnosis not present

## 2017-11-28 DIAGNOSIS — Z6830 Body mass index (BMI) 30.0-30.9, adult: Secondary | ICD-10-CM | POA: Diagnosis not present

## 2017-12-19 ENCOUNTER — Other Ambulatory Visit (HOSPITAL_COMMUNITY): Payer: Self-pay | Admitting: Physician Assistant

## 2017-12-19 DIAGNOSIS — C50921 Malignant neoplasm of unspecified site of right male breast: Secondary | ICD-10-CM

## 2017-12-25 ENCOUNTER — Ambulatory Visit (HOSPITAL_COMMUNITY)
Admission: RE | Admit: 2017-12-25 | Discharge: 2017-12-25 | Disposition: A | Discharge status: Home / Self Care | Payer: Medicare Other | Source: Ambulatory Visit | Attending: Physician Assistant | Admitting: Physician Assistant

## 2017-12-25 ENCOUNTER — Encounter (HOSPITAL_COMMUNITY): Payer: Self-pay

## 2017-12-25 DIAGNOSIS — C50921 Malignant neoplasm of unspecified site of right male breast: Secondary | ICD-10-CM

## 2017-12-25 DIAGNOSIS — R928 Other abnormal and inconclusive findings on diagnostic imaging of breast: Secondary | ICD-10-CM | POA: Diagnosis not present

## 2017-12-25 HISTORY — DX: Personal history of antineoplastic chemotherapy: Z92.21

## 2018-01-16 ENCOUNTER — Ambulatory Visit (INDEPENDENT_AMBULATORY_CARE_PROVIDER_SITE_OTHER): Payer: Medicare Other | Admitting: Physician Assistant

## 2018-01-16 ENCOUNTER — Other Ambulatory Visit: Payer: Self-pay

## 2018-01-16 ENCOUNTER — Ambulatory Visit (HOSPITAL_COMMUNITY)
Admission: RE | Admit: 2018-01-16 | Discharge: 2018-01-16 | Disposition: A | Discharge status: Home / Self Care | Payer: Medicare Other | Source: Ambulatory Visit | Attending: Vascular Surgery | Admitting: Vascular Surgery

## 2018-01-16 ENCOUNTER — Encounter: Payer: Self-pay | Admitting: Physician Assistant

## 2018-01-16 VITALS — BP 139/79 | HR 77 | Temp 98.0°F | Resp 18 | Ht 75.0 in | Wt 232.0 lb

## 2018-01-16 DIAGNOSIS — I779 Disorder of arteries and arterioles, unspecified: Secondary | ICD-10-CM

## 2018-01-16 DIAGNOSIS — I10 Essential (primary) hypertension: Secondary | ICD-10-CM | POA: Insufficient documentation

## 2018-01-16 DIAGNOSIS — I6523 Occlusion and stenosis of bilateral carotid arteries: Secondary | ICD-10-CM

## 2018-01-16 DIAGNOSIS — I739 Peripheral vascular disease, unspecified: Secondary | ICD-10-CM

## 2018-01-16 DIAGNOSIS — I252 Old myocardial infarction: Secondary | ICD-10-CM | POA: Insufficient documentation

## 2018-01-16 DIAGNOSIS — Z8673 Personal history of transient ischemic attack (TIA), and cerebral infarction without residual deficits: Secondary | ICD-10-CM | POA: Diagnosis not present

## 2018-01-16 DIAGNOSIS — I2510 Atherosclerotic heart disease of native coronary artery without angina pectoris: Secondary | ICD-10-CM | POA: Insufficient documentation

## 2018-01-16 DIAGNOSIS — Z87891 Personal history of nicotine dependence: Secondary | ICD-10-CM | POA: Insufficient documentation

## 2018-01-16 NOTE — Progress Notes (Signed)
History of Present Illness:  Patient is a 67 y.o. year old male who presents for evaluation of carotid stenosis follow up visit.  He has a history of right ICA occlusion  As well as right CVA in 2007.  The patient denies symptoms of TIA, amaurosis, or stroke.  The patient is currently on 325 mg Aspirin daily antiplatelet therapy.    Other medical problems include  He does have a remote hx of Hodgkin's Disease treated with mantle radiation in 1983 and subsequent right breast cancer in 2012 that was treated with surgery.  He states that he has a hx of CABG x 3 by Dr. Lawson Fiscal in 2014.   His current Cardiologist is Dr. Ellyn Hack and a Pulmonologist in [place].  He reports no changes in his medical history since his last visit other than starting on a CPAP at night.  He states this has improved his life and he has more energy.  Pt meds includes: Statin:  Yes.   Beta Blocker:  Yes.   Aspirin:  Yes.     Past Medical History:  Diagnosis Date  . Adie's pupil   . Anemia 07/14/2013  . Borderline diabetes   . CAD (coronary artery disease), native coronary artery 04/09/2013   Dist LM stenosis 70-80% (at trifurcation into LAD, CX, & 2 Ramus branches, 70% mid RCA);; b) Myoview 08/06/14: Low Risk, no Ischemia/infarction  . Depression 1983   "after I was dx'd w/CA; now just comes in spells"  (12/22/2015)  . Essential hypertension   . Exertional shortness of breath   . Hodgkin's lymphoma ([place]) 1983  . Hypercholesterolemia   . Hypothyroidism   . Invasive ductal carcinoma of breast, stage 2 ([place]) 04/27/2011   chemo, mastectomy  . Left carotid artery partial occlusion 04/27/2011   CAROTIC DOPPLER 11/2013: < 40% R ICA stenosis, distal waveforms damped - suggests probably intracranial occlusoin, < 58% LICA, normal vertebrals -- no change  . Mild mitral stenosis by prior echocardiogram 08/06/2014    Noted on echocardiogram  . Personal history of chemotherapy   . Restless leg syndrome   . Right carotid  artery occlusion 04/27/2011  . S/P CABG x 3 04/09/2013   LIMA-LAD, fLRAD-RI, SVG-RCA; Echo 10/29/'15:  mild Conc LVH, no WMA, Gr 1 DD, Mod AoV Sclerosis w/ Mod AI, ~Mild-Mod MS   . Stroke [facility])    2007  . Thyroid disease     Past Surgical History:  Procedure Laterality Date  . BREAST BIOPSY Right   . CARDIAC CATHETERIZATION  2014  . CATARACT EXTRACTION W/PHACO Right 06/21/2015   Procedure: CATARACT EXTRACTION PHACO AND INTRAOCULAR LENS PLACEMENT ([place]);  Surgeon: Tonny Branch, MD;  Location: [facility];  Service: Ophthalmology;  Laterality: Right;  CDE: 7.05  . CATARACT EXTRACTION W/PHACO Left 07/01/2015   Procedure: CATARACT EXTRACTION PHACO AND INTRAOCULAR LENS PLACEMENT ([facility]);  Surgeon: Tonny Branch, MD;  Location: [facility];  Service: Ophthalmology;  Laterality: Left;  CDE: 7.49  . COLONOSCOPY [name] 05/06/2015   Procedure: COLONOSCOPY;  Surgeon: Rogene Houston, MD;  Location[facility];  Service: Endoscopy;  Laterality: [name];  930  . CORONARY ARTERY BYPASS GRAFT [name] 04/08/2013   Procedure: CORONARY ARTERY BYPASS GRAFTING (CABG);  Surgeon: Ivin Poot, MD;  Location: [place];  Service: Open Heart Surgery;  Laterality: [name];  Times 3 using left internal mammary artery; endoscopically harvested right saphenous vein and left radial artery  . INTRAOPERATIVE TRANSESOPHAGEAL ECHOCARDIOGRAM [name] 04/08/2013   Procedure: INTRAOPERATIVE  TRANSESOPHAGEAL ECHOCARDIOGRAM;  Surgeon: Ivin Poot, MD;  Location: [place];  Service: Open Heart Surgery;  Laterality: [name];  . LAPAROSCOPIC APPENDECTOMY [name] 12/23/2015   Procedure: APPENDECTOMY LAPAROSCOPIC;  Surgeon: Donnie Mesa, MD;  Location: [place];  Service: General;  Laterality: [name];  . LEFT HEART CATHETERIZATION WITH CORONARY ANGIOGRAM [name] 04/07/2013   Procedure: LEFT HEART CATHETERIZATION WITH CORONARY ANGIOGRAM;  Surgeon: Leonie Man, MD;  Location: [facility] CATH LAB;  Service: Cardiovascular;  Laterality: [name];  . LYMPH NODE BIOPSY Left 1983   neck  . MASTECTOMY Right     w/axillary lymph node dissection  . NM TREADMILL MYOVIEW LTD  08/06/2014   Exercise 5:07 min, 6.3 METS; symptoms noted or extreme dyspnea and lightheadedness with some chest tightness. No EKG changes. No ischemia or infarction was normal EF.  Marland Kitchen RADIAL ARTERY HARVEST Left 04/08/2013   Procedure: RADIAL ARTERY HARVEST;  Surgeon: Ivin Poot, MD;  Location: [place];  Service: Open Heart Surgery;  Laterality: Left;  . TONSILLECTOMY  1974  . TRANSTHORACIC ECHOCARDIOGRAM  08/06/2014    Normal EF 55-60%. Gr 1 DD - increased LVEDP. Moderate aortic valve thickening suggestive of AoV Sclerosis but not stenosis. Mild AI; MIld MS - mean gradient of 7 mmHg & HR of 122 bpm.     Social History Social History   Tobacco Use  . Smoking status: Former Smoker    Packs/day: 1.00    Years: 15.00    Pack years: 15.00    Types: Cigarettes    Last attempt to quit: 06/09/1982    Years since quitting: 35.6  . Smokeless tobacco: Never Used  Substance Use Topics  . Alcohol use: No  . Drug use: No    Family History Family History  Problem Relation Age of Onset  . Alzheimer's disease Mother   . Stroke Mother   . Hypertension Mother   . Hyperlipidemia Mother   . Diabetes Mother   . Hypertension Father   . Diabetes Paternal Grandmother   . Diabetes Paternal Grandfather   . Breast cancer Paternal Aunt   . Ovarian cancer Paternal Aunt     Allergies  No Known Allergies   Current Outpatient Medications  Medication Sig Dispense Refill  . acetaminophen (TYLENOL) 500 MG tablet Take 500 mg by mouth every 6 (six) hours as needed for mild pain.     . Albuterol Sulfate (PROAIR RESPICLICK) 865 (90 Base) MCG/ACT AEPB Inhale 1 puff into the lungs 4 (four) times daily as needed (shortness of breath.).    Marland Kitchen ALPRAZolam (XANAX) 1 MG tablet Take 0.5 mg by mouth at bedtime as needed for anxiety.     Marland Kitchen aspirin EC 325 MG tablet Take 325 mg by mouth daily.     . Calcium Carbonate-Vit D-Min (CALCIUM 1200 PO) Take 1  tablet by mouth daily.     . Cholecalciferol (VITAMIN D-3 PO) Take 1 tablet by mouth daily.     . citalopram (CELEXA) 20 MG tablet Take 40 mg by mouth every evening.     . Coenzyme Q10 (CO Q10) 100 MG CAPS Take by mouth.    . diphenhydrAMINE (BENADRYL) 25 MG tablet Take 25 mg by mouth daily as needed for allergies.    . Garlic 784 MG CAPS Take 500 mg by mouth daily.    . Ginkgo Biloba 60 MG CAPS Take 1 capsule by mouth daily.    Marland Kitchen levothyroxine (SYNTHROID, LEVOTHROID) 75 MCG tablet Take 75 mcg by mouth daily.     Marland Kitchen  nitroGLYCERIN (NITROSTAT) 0.4 MG SL tablet Place 1 tablet (0.4 mg total) under the tongue every 5 (five) minutes as needed for chest pain. 25 tablet 3  . rOPINIRole (REQUIP) 2 MG tablet Take 2 mg by mouth at bedtime.     . simvastatin (ZOCOR) 40 MG tablet Take 40 mg by mouth at bedtime.     Marland Kitchen Umeclidinium-Vilanterol (ANORO ELLIPTA) 62.5-25 MCG/INH AEPB Inhale 1 puff into the lungs daily.     No current facility-administered medications for this visit.     ROS:   General:  No weight loss, Fever, chills  HEENT: No recent headaches, no nasal bleeding, no visual changes, no sore throat  Neurologic: No dizziness, blackouts, seizures. No recent symptoms of stroke or mini- stroke. No recent episodes of slurred speech, or temporary blindness.  Cardiac: No recent episodes of chest pain/pressure, no shortness of breath at rest.  No shortness of breath with exertion.  Denies history of atrial fibrillation or irregular heartbeat  Vascular: No history of rest pain in feet.  No history of claudication.  No history of non-healing ulcer, No history of DVT   Pulmonary: No home oxygen, no productive cough, no hemoptysis,  No asthma or wheezing.  CPAP at night, exertional SOB  Musculoskeletal:  [x ] Arthritis, [ ]  Low back pain,  [ ]  Joint pain  Hematologic:No history of hypercoagulable state.  No history of easy bleeding.  positive history of anemia  Gastrointestinal: No hematochezia or  melena,  No gastroesophageal reflux, no trouble swallowing  Urinary: [ ]  chronic Kidney disease, [ ]  on HD - [ ]  MWF or [ ]  TTHS, [ ]  Burning with urination, [ ]  Frequent urination, [ ]  Difficulty urinating;   Skin: No rashes  Psychological: No history of anxiety,  No history of depression   Physical Examination  There were no vitals filed for this visit.  There is no height or weight on file to calculate BMI.  General:  Alert and oriented, no acute distress HEENT: Normal, normocephalic Neck: No bruit or JVD Pulmonary: Clear to auscultation bilaterally, no wheezing or rhonchi Cardiac: Regular Rate and Rhythm without murmur Gastrointestinal: Soft, non-tender, non-distended, no mass, no scars Skin: No rash Extremity Pulses:  2+ radial, brachial, femoral, dorsalis pedis,  pulses bilaterally Musculoskeletal: No deformity or edema  Neurologic: Upper and lower extremity motor 5/5 and symmetric  DATA:  Carotid duplex 01/16/2018 Right ICA occlusion Left < 40% stenosis    ASSESSMENT:  Carotid stenosis Known right ICA occlusion   PLAN: He is in good health and has not had any symptoms of TIA or Stroke since his last visit he remains asymptomatic.  His carotid duplex is basically unchanged.  We will continue to monitor him yearly.  He will return for repeat carotid duplex in 1 year.  We review signs and symptoms of TIA and stroke today.  If he has any of these he will call 911.    He has reestablished with Dr. Ellyn Hack and was last seen on 01/24/2017 and started a moderate dose of statin, his previous statin was stopped.     Roxy Horseman PA-C [facility]

## 2018-01-31 ENCOUNTER — Ambulatory Visit (INDEPENDENT_AMBULATORY_CARE_PROVIDER_SITE_OTHER): Payer: Medicare Other | Admitting: Cardiology

## 2018-01-31 ENCOUNTER — Encounter: Payer: Self-pay | Admitting: Cardiology

## 2018-01-31 VITALS — BP 140/68 | HR 83 | Ht 75.0 in | Wt 232.6 lb

## 2018-01-31 DIAGNOSIS — J700 Acute pulmonary manifestations due to radiation: Secondary | ICD-10-CM | POA: Diagnosis not present

## 2018-01-31 DIAGNOSIS — I351 Nonrheumatic aortic (valve) insufficiency: Secondary | ICD-10-CM | POA: Diagnosis not present

## 2018-01-31 DIAGNOSIS — Z951 Presence of aortocoronary bypass graft: Secondary | ICD-10-CM

## 2018-01-31 DIAGNOSIS — R5382 Chronic fatigue, unspecified: Secondary | ICD-10-CM | POA: Diagnosis not present

## 2018-01-31 DIAGNOSIS — I6523 Occlusion and stenosis of bilateral carotid arteries: Secondary | ICD-10-CM

## 2018-01-31 DIAGNOSIS — E785 Hyperlipidemia, unspecified: Secondary | ICD-10-CM

## 2018-01-31 DIAGNOSIS — I2510 Atherosclerotic heart disease of native coronary artery without angina pectoris: Secondary | ICD-10-CM

## 2018-01-31 DIAGNOSIS — I951 Orthostatic hypotension: Secondary | ICD-10-CM | POA: Diagnosis not present

## 2018-01-31 DIAGNOSIS — I10 Essential (primary) hypertension: Secondary | ICD-10-CM

## 2018-01-31 DIAGNOSIS — G2581 Restless legs syndrome: Secondary | ICD-10-CM | POA: Diagnosis not present

## 2018-01-31 DIAGNOSIS — I350 Nonrheumatic aortic (valve) stenosis: Secondary | ICD-10-CM | POA: Insufficient documentation

## 2018-01-31 DIAGNOSIS — G4733 Obstructive sleep apnea (adult) (pediatric): Secondary | ICD-10-CM | POA: Diagnosis not present

## 2018-01-31 DIAGNOSIS — R002 Palpitations: Secondary | ICD-10-CM | POA: Diagnosis not present

## 2018-01-31 DIAGNOSIS — E669 Obesity, unspecified: Secondary | ICD-10-CM | POA: Diagnosis not present

## 2018-01-31 MED ORDER — ASPIRIN EC 81 MG PO TBEC: 81.0000 mg | DELAYED_RELEASE_TABLET | Freq: Every day | ORAL | 3 refills | Status: DC

## 2018-01-31 NOTE — Patient Instructions (Signed)
MEDICATION INSTRUCTION  DECREASE ASPIRIN TO 14 MG  TAKE ONE TABLET DAILY     WILL SCHEDULE AT [place] [address] Your physician has requested that you have an echocardiogram. Echocardiography is a painless test that uses sound waves to create images of your heart. It provides your doctor with information about the size and shape of your heart and how well your heart's chambers and valves are working. This procedure takes approximately one hour. There are no restrictions for this procedure.   Your physician wants you to follow-up in [place].You will receive a reminder letter in the mail two months in advance. If you don't receive a letter, please call our office to schedule the follow-up appointment.    If you need a refill on your cardiac medications before your next appointment, please call your pharmacy.

## 2018-01-31 NOTE — Progress Notes (Signed)
PCP: Redmond School, MD  Clinic Note: Chief Complaint  Patient presents with  . Follow-up    No complaints  . Coronary Artery Disease    CABG    HPI: Ryan [name] is a 68 y.o. male with a PMH below who presents today for annual follow-up with history of CAD (potentially related to history of mantle radiation from his Hodgkin's lymphoma).. It is presumed that some of his CAD is related to history of Hodgkin's lymphoma treated with mantle radiation in 1983. Subsequent development right breast cancer in 2012 treated with surgery - he was on chronic tamoxifen for suppression therapy until May 2017.  He is followed by a pulmonologist and [place] for his profound dyspnea which is likely related to radiation pneumonitis. --Initial cardiology evaluation in June 2014 for chest discomfort and exertional dyspnea with likely class III angina.  Cardiac catheterization revealed severe left main disease as well as proximal RCA disease and he is referred for CABG. as part of his preop CABG evaluation he w may Strader as noted to have occluded right carotid. -->  (CABG 3: LIMA-LAD, SVG-RCA, Lrad-Ramus) in July 14. Carotids followed by Dr. Scot Dock.  Ryan [name] was last seen in April 2018 --> because of fatigue and exertional dyspnea, I decided to stop his metoprolol tartrate in order to avoid chronotropic incompetence.    Recent Hospitalizations: None  Studies Personally Reviewed - (if available, images/films reviewed: From Epic Chart or Care Everywhere)  Carotid Dopplers (followed by Dr. Scot Dock) -- CTO of the distal right ICA.  ECA greater than 50%.  Left ICA <40%.  Bilateral vertebrals and subclavian arteries stable.  Interval History: Ryan [name] returns here today for annual follow-up feeling so much better since he finally got placed on CPAP.  He says probably more so than anything else being on CPAP has allowed him to sleep better and feels much better rested.  He now has energy to walk  during the day.  His exertional dyspnea has notably improved.  He interestingly enough, has been losing weight steadily despite not really eating any differently. He has not had any symptoms of chest tightness or pressure with rest or exertion and his exertional dyspnea is notably improved.  He denies any PND orthopnea and minimal at the end of day edema.  He denies any rapid irregular heartbeats or palpitations.  No syncope/near syncope or TIA/amaurosis fugax.  No melena, hematochezia, hematuria, or epstaxis. No claudication, but his legs do ache sometimes at night with some restless leg type symptoms.  ROS: A comprehensive was performed. Review of Systems  Constitutional: Positive for weight loss (Not really intentional but not undesired). Negative for chills, fever and malaise/fatigue (Much less fatigue.  Energy much improved since starting CPAP).  HENT: Negative for congestion and nosebleeds.   Respiratory: Positive for shortness of breath (Notably improved). Negative for cough and wheezing.   Cardiovascular:       Per HPI  Gastrointestinal: Negative for abdominal pain, heartburn, nausea and vomiting.  Musculoskeletal: Positive for myalgias (Legs ache at night).  Neurological: Negative for dizziness.  Endo/Heme/Allergies: Negative for environmental allergies.  Psychiatric/Behavioral: Negative for depression and memory loss. The patient is not nervous/anxious.   All other systems reviewed and are negative.   I have reviewed and (if needed) personally updated the patient's problem list, medications, allergies, past medical and surgical history, social and family history.   Past Medical History:  Diagnosis Date  . Adie's pupil   . Anemia 07/14/2013  .  Borderline diabetes   . CAD (coronary artery disease), native coronary artery 04/09/2013   Dist LM stenosis 70-80% (at trifurcation into LAD, CX, & 2 Ramus branches, 70% mid RCA);; b) Myoview 08/06/14: Low Risk, no Ischemia/infarction  .  Depression 1983   "after I was dx'd w/CA; now just comes in spells"  (12/22/2015)  . Essential hypertension   . Exertional shortness of breath   . Hodgkin's lymphoma (Ryan [name]) 1983  . Hypercholesterolemia   . Hypothyroidism   . Invasive ductal carcinoma of breast, stage 2 (Ryan [name]) 04/27/2011   chemo, mastectomy  . Left carotid artery partial occlusion 04/27/2011   CAROTIC DOPPLER 11/2013: < 40% R ICA stenosis, distal waveforms damped - suggests probably intracranial occlusoin, < 83% LICA, normal vertebrals -- no change  . Mild mitral stenosis by prior echocardiogram 08/06/2014    Noted on echocardiogram  . Personal history of chemotherapy   . Restless leg syndrome   . Right carotid artery occlusion 04/27/2011  . S/P CABG x 3 04/09/2013   LIMA-LAD, fLRAD-RI, SVG-RCA; Echo 10/29/'15:  mild Conc LVH, no WMA, Gr 1 DD, Mod AoV Sclerosis w/ Mod AI, ~Mild-Mod MS   . Stroke Ryan [name])    2007  . Thyroid disease     Past Surgical History:  Procedure Laterality Date  . BREAST BIOPSY Right   . CARDIAC CATHETERIZATION  2014  . CATARACT EXTRACTION W/PHACO Right 06/21/2015   Procedure: CATARACT EXTRACTION PHACO AND INTRAOCULAR LENS PLACEMENT (Ryan [name]);  Surgeon: Ryan Branch, MD;  Location: [facility];  Service: Ophthalmology;  Laterality: Right;  CDE: 7.05  . CATARACT EXTRACTION W/PHACO Left 07/01/2015   Procedure: CATARACT EXTRACTION PHACO AND INTRAOCULAR LENS PLACEMENT (IOC);  Surgeon: Ryan Branch, MD;  Location: [facility];  Service: Ophthalmology;  Laterality: Left;  CDE: 7.49  . COLONOSCOPY [name] 05/06/2015   Procedure: COLONOSCOPY;  Surgeon: Ryan Houston, MD;  Location: [facility];  Service: Endoscopy;  Laterality: [name];  930  . CORONARY ARTERY BYPASS GRAFT [name] 04/08/2013   Procedure: CORONARY ARTERY BYPASS GRAFTING (CABG);  Surgeon: Ryan Poot, MD;  Location: [facility];  Service: Open Heart Surgery;  Laterality: [name];  Times 3 using left internal mammary artery; endoscopically harvested right saphenous vein and left radial  artery  . INTRAOPERATIVE TRANSESOPHAGEAL ECHOCARDIOGRAM [name] 04/08/2013   Procedure: INTRAOPERATIVE TRANSESOPHAGEAL ECHOCARDIOGRAM;  Surgeon: Ryan Poot, MD;  Location: [facility];  Service: Open Heart Surgery;  Laterality: [name];  . LAPAROSCOPIC APPENDECTOMY [name] 12/23/2015   Procedure: APPENDECTOMY LAPAROSCOPIC;  Surgeon: Donnie Mesa, MD;  Location: [facility];  Service: General;  Laterality: [name];  . LEFT HEART CATHETERIZATION WITH CORONARY ANGIOGRAM [name] 04/07/2013   Procedure: LEFT HEART CATHETERIZATION WITH CORONARY ANGIOGRAM;  Surgeon: Leonie Man, MD;  Location: [facility] CATH LAB;  Service: Cardiovascular;  Laterality: [name];  . LYMPH NODE BIOPSY Left 1983   neck  . MASTECTOMY Right    w/axillary lymph node dissection  . NM TREADMILL MYOVIEW LTD  08/06/2014   Exercise 5:07 min, 6.3 METS; symptoms noted or extreme dyspnea and lightheadedness with some chest tightness. No EKG changes. No ischemia or infarction was normal EF.  Marland Kitchen RADIAL ARTERY HARVEST Left 04/08/2013   Procedure: RADIAL ARTERY HARVEST;  Surgeon: Ryan Poot, MD;  Location: [facility];  Service: Open Heart Surgery;  Laterality: Left;  . TONSILLECTOMY  1974  . TRANSTHORACIC ECHOCARDIOGRAM  08/06/2014    Normal EF 55-60%. Gr 1 DD - increased LVEDP. Moderate aortic valve thickening suggestive of AoV Sclerosis but not  stenosis. Mild AI; MIld MS - mean gradient of 7 mmHg & HR of 122 bpm.     Current Meds  Medication Sig  . acetaminophen (TYLENOL) 500 MG tablet Take 500 mg by mouth every 6 (six) hours as needed for mild pain.   Marland Kitchen albuterol (PROVENTIL HFA;VENTOLIN HFA) 108 (90 Base) MCG/ACT inhaler Inhale 2 puffs into the lungs every 6 (six) hours as needed for wheezing or shortness of breath.  . ALPRAZolam (XANAX) 1 MG tablet Take 0.5 mg by mouth at bedtime as needed for anxiety.   . Cholecalciferol (VITAMIN D-3 PO) Take 1 tablet by mouth daily.   Marland Kitchen CINNAMON PO Take by mouth.  . citalopram (CELEXA) 20 MG tablet Take 40 mg by mouth every evening.    . Cyanocobalamin (VITAMIN B 12 PO) Take by mouth.  . diphenhydrAMINE (BENADRYL) 25 MG tablet Take 25 mg by mouth daily as needed for allergies.  Marland Kitchen ECHINACEA EXTRACT PO Take by mouth.  . Garlic 175 MG CAPS Take 500 mg by mouth daily.  . Ginkgo Biloba 60 MG CAPS Take 1 capsule by mouth daily.  Marland Kitchen levothyroxine (SYNTHROID, LEVOTHROID) 75 MCG tablet Take 75 mcg by mouth daily.   . Magnesium 250 MG TABS Take 1 tablet by mouth daily.  . nitroGLYCERIN (NITROSTAT) 0.4 MG SL tablet Place 1 tablet (0.4 mg total) under the tongue every 5 (five) minutes as needed for chest pain.  Marland Kitchen OVER THE COUNTER MEDICATION Take 1 tablet by mouth daily. Ultra kidney complex  . rOPINIRole (REQUIP) 2 MG tablet Take 2 mg by mouth at bedtime.   Marland Kitchen rOPINIRole (REQUIP) 4 MG tablet   . simvastatin (ZOCOR) 40 MG tablet Take 40 mg by mouth at bedtime.   . [DISCONTINUED] aspirin EC 325 MG tablet Take 325 mg by mouth daily.     No Known Allergies  Social History   Tobacco Use  . Smoking status: Former Smoker    Packs/day: 1.00    Years: 15.00    Pack years: 15.00    Types: Cigarettes    Last attempt to quit: 06/09/1982    Years since quitting: 35.6  . Smokeless tobacco: Never Used  Substance Use Topics  . Alcohol use: No  . Drug use: No   Social History   Social History Narrative   Married. Former smoker who quit in 1983.   No routine exercise - gained ~40lb post CABG.    family history includes Alzheimer's disease in his mother; Breast cancer in his paternal aunt; Diabetes in his mother, paternal grandfather, and paternal grandmother; Hyperlipidemia in his mother; Hypertension in his father and mother; Ovarian cancer in his paternal aunt; Stroke in his mother.  Wt Readings from Last 3 Encounters:  01/31/18 232 lb 9.6 oz (105.5 kg)  01/16/18 232 lb (105.2 kg)  07/27/17 239 lb 6.4 oz (108.6 kg)    PHYSICAL EXAM BP 140/68   Pulse 83   Ht 6\' 3"  (1.905 m)   Wt 232 lb 9.6 oz (105.5 kg)   BMI 29.07 kg/m    Physical Exam  Constitutional: He is oriented to person, place, and time. He appears well-developed and well-nourished. No distress.  Healthy-appearing.  Well-groomed  HENT:  Head: Normocephalic and atraumatic.  Neck: Decreased carotid pulses (On the right side) present. No hepatojugular reflux and no JVD present. Carotid bruit is present (I do not really hear a right carotid bruit.  There is a soft bruit on the left).  Cardiovascular: Normal rate and regular  rhythm.  No extrasystoles are present. PMI is not displaced. Exam reveals no gallop, no friction rub and no decreased pulses.  Murmur (Soft SEM at RUSB.) heard. Soft split S2.  Pulmonary/Chest: Effort normal and breath sounds normal. No respiratory distress.  Mild interstitial sounds, but no rales or rhonchi.  Abdominal: Soft. Bowel sounds are normal. He exhibits no distension. There is no tenderness.  Musculoskeletal: Normal range of motion. He exhibits no edema.  Neurological: He is alert and oriented to person, place, and time.  Psychiatric: He has a normal mood and affect. His behavior is normal. Judgment and thought content normal.  Vitals reviewed.  Adult ECG Report  Rate: 83;  Rhythm: normal sinus rhythm and First-degree AV block, left axis deviation of (-43), RBBB.;   Narrative Interpretation: Stable EKG   Other studies Reviewed: Additional studies/ records that were reviewed today include:  Recent Labs:  From PCP July 2018 --- TC 163, TG 106, HDL 56, LDL 86; A1c 6.4; BUN/CR 12/1.28, A1c 6.4, TSH 2.45   ASSESSMENT / PLAN: Problem List Items Addressed This Visit    S/P CABG x 3  LIMA-LAD, free LRA to RI, SVG-RCA  - 04/09/13 (Chronic)    We checked the Myoview in 2015 which is 1 year post CABG.  We talked about whether or not we should do a screening Myoview, but since he is much more active now that he had been before and is not having symptoms, I do not really see the need of doing so unless things were to worsen.       Orthostatic hypotension (Chronic)    He clearly has some autonomic dysfunction, but his blood pressure looks better today.  Pretty much of the upper limit of what we want to be but we have tried to allow it to be a little bit higher than the usual baseline.  He is not on any blood pressure medications as such. Talked about the importance of adequate hydration..      Relevant Medications   aspirin EC 81 MG tablet   Obesity (BMI 30-39.9) (Chronic)    Interesting that he has lost weight.  I suspect this is because he is probably now more active.  If he probably reduce his caloric intake he would lose even more weight.      Hyperlipidemia with target LDL less than 70 (Chronic)    Not quite at goal last July on simvastatin.  He is due for recheck in a few months.  If he is not at goal at that time, my recommendation would be to consider converting Zocor to Crestor or atorvastatin for more potency to see if we can get the LDL down below 70.      Relevant Medications   aspirin EC 81 MG tablet   Heart palpitations    Pretty much nonexistent following revascularization despite being off beta-blocker.      Relevant Orders   EKG 12-Lead (Completed)   ECHOCARDIOGRAM COMPLETE   Fatigue (Chronic)    Followed up in the past with Myoview and echo that look pretty Ryan.  Doing better off beta-blocker now that he has had CPAP initiated.      Essential hypertension (Chronic)    His blood pressure is a little bit higher today than it had been in the past, but he seems to be doing much better with better energy and less orthostatic symptoms since being off his beta-blocker.  At this point in time I would probably leave him off  ACE inhibitor or ARB to avoid the orthostatic symptoms.      Relevant Medications   aspirin EC 81 MG tablet   Coronary atherosclerosis of native coronary artery - multivessel CAD, status post CABG x3 (LIMA-LAD, Left Radial-RI, SVG-RCA) - Primary (Chronic)    5 years out from  CABG, 3-1/2 years out from last Myoview with no active anginal symptoms.  He actually is feeling better now that he has a long time.  Again I would probably would prefer to avoid doing screening Myoview at this time unless he has worsening symptoms. Plan: Continue aspirin but reduce to 81 mg.  Continue statin (however would probably consider changing to a more potent statin if not at goal).  Continue off of beta-blocker since he is feeling so much better, and for now to avoid orthostatic hypotension we are holding off on ACE inhibitor or ARB.      Relevant Medications   aspirin EC 81 MG tablet   Other Relevant Orders   EKG 12-Lead (Completed)   Aortic ejection murmur (Chronic)    Is been 4 years since we checked an echo that did show some aortic sclerosis and mild mitral stenosis.  Not unreasonable to check an echo just to ensure that the valves are stable. Plan: Check 2D echo      Relevant Orders   ECHOCARDIOGRAM COMPLETE      I spent a total of 25 minutes with the patient and chart review. >  50% of the time was spent in direct patient consultation.   Current medicines are reviewed at length with the patient today.  (+/- concerns) n/a The following changes have been made:  Reduce aspirin to 81 mg  Patient Instructions  MEDICATION INSTRUCTION  DECREASE ASPIRIN TO 81 MG  TAKE ONE TABLET DAILY     WILL SCHEDULE AT [place] [address] Your physician has requested that you have an echocardiogram. Echocardiography is a painless test that uses sound waves to create images of your heart. It provides your doctor with information about the size and shape of your heart and how well your heart's chambers and valves are working. This procedure takes approximately one hour. There are no restrictions for this procedure.   Your physician wants you to follow-up in [place].You will receive a reminder letter in the mail two months in advance. If you don't receive a  letter, please call our office to schedule the follow-up appointment.    If you need a refill on your cardiac medications before your next appointment, please call your pharmacy.      Studies Ordered:   Orders Placed This Encounter  Procedures  . EKG 12-Lead  . ECHOCARDIOGRAM COMPLETE      Glenetta Hew, M.D., M.S. Interventional Cardiologist   Pager # [phone] Phone # [phone] [address] [place], [place] [address]   Thank you for choosing [facility] at [name]!!

## 2018-02-02 ENCOUNTER — Encounter: Payer: Self-pay | Admitting: Cardiology

## 2018-02-02 NOTE — Assessment & Plan Note (Signed)
Followed up in the past with Myoview and echo that look pretty good.  Doing better off beta-blocker now that he has had CPAP initiated.

## 2018-02-02 NOTE — Assessment & Plan Note (Addendum)
Not quite at goal last July on simvastatin.  He is due for recheck in a few months.  If he is not at goal at that time, my recommendation would be to consider converting Zocor to Crestor or atorvastatin for more potency to see if we can get the LDL down below 70.

## 2018-02-02 NOTE — Assessment & Plan Note (Signed)
Interesting that he has lost weight.  I suspect this is because he is probably now more active.  If he probably reduce his caloric intake he would lose even more weight.

## 2018-02-02 NOTE — Assessment & Plan Note (Signed)
His blood pressure is a little bit higher today than it had been in the past, but he seems to be doing much better with better energy and less orthostatic symptoms since being off his beta-blocker.  At this point in time I would probably leave him off ACE inhibitor or ARB to avoid the orthostatic symptoms.

## 2018-02-02 NOTE — Assessment & Plan Note (Signed)
Is been 4 years since we checked an echo that did show some aortic sclerosis and mild mitral stenosis.  Not unreasonable to check an echo just to ensure that the valves are stable. Plan: Check 2D echo

## 2018-02-02 NOTE — Assessment & Plan Note (Signed)
He clearly has some autonomic dysfunction, but his blood pressure looks better today.  Pretty much of the upper limit of what we want to be but we have tried to allow it to be a little bit higher than the usual baseline.  He is not on any blood pressure medications as such. Talked about the importance of adequate hydration.Marland Kitchen

## 2018-02-02 NOTE — Assessment & Plan Note (Signed)
We checked the Myoview in 2015 which is 1 year post CABG.  We talked about whether or not we should do a screening Myoview, but since he is much more active now that he had been before and is not having symptoms, I do not really see the need of doing so unless things were to worsen.

## 2018-02-02 NOTE — Assessment & Plan Note (Signed)
5 years out from CABG, 3-1/2 years out from last Myoview with no active anginal symptoms.  He actually is feeling better now that he has a long time.  Again I would probably would prefer to avoid doing screening Myoview at this time unless he has worsening symptoms. Plan: Continue aspirin but reduce to 81 mg.  Continue statin (however would probably consider changing to a more potent statin if not at goal).  Continue off of beta-blocker since he is feeling so much better, and for now to avoid orthostatic hypotension we are holding off on ACE inhibitor or ARB.

## 2018-02-02 NOTE — Assessment & Plan Note (Signed)
Pretty much nonexistent following revascularization despite being off beta-blocker.

## 2018-02-06 DIAGNOSIS — I350 Nonrheumatic aortic (valve) stenosis: Secondary | ICD-10-CM

## 2018-02-06 HISTORY — DX: Nonrheumatic aortic (valve) stenosis: I35.0

## 2018-02-08 ENCOUNTER — Other Ambulatory Visit: Payer: Self-pay

## 2018-02-08 ENCOUNTER — Ambulatory Visit (HOSPITAL_COMMUNITY): Payer: Medicare Other | Attending: Cardiology

## 2018-02-08 DIAGNOSIS — Z9221 Personal history of antineoplastic chemotherapy: Secondary | ICD-10-CM | POA: Insufficient documentation

## 2018-02-08 DIAGNOSIS — I2510 Atherosclerotic heart disease of native coronary artery without angina pectoris: Secondary | ICD-10-CM | POA: Diagnosis not present

## 2018-02-08 DIAGNOSIS — R0600 Dyspnea, unspecified: Secondary | ICD-10-CM | POA: Insufficient documentation

## 2018-02-08 DIAGNOSIS — Z87891 Personal history of nicotine dependence: Secondary | ICD-10-CM | POA: Insufficient documentation

## 2018-02-08 DIAGNOSIS — I351 Nonrheumatic aortic (valve) insufficiency: Secondary | ICD-10-CM | POA: Insufficient documentation

## 2018-02-08 DIAGNOSIS — Z853 Personal history of malignant neoplasm of breast: Secondary | ICD-10-CM | POA: Diagnosis not present

## 2018-02-08 DIAGNOSIS — R002 Palpitations: Secondary | ICD-10-CM | POA: Diagnosis not present

## 2018-02-08 DIAGNOSIS — I10 Essential (primary) hypertension: Secondary | ICD-10-CM | POA: Diagnosis not present

## 2018-02-08 DIAGNOSIS — E785 Hyperlipidemia, unspecified: Secondary | ICD-10-CM | POA: Insufficient documentation

## 2018-02-11 ENCOUNTER — Telehealth: Payer: Self-pay | Admitting: *Deleted

## 2018-02-11 DIAGNOSIS — R931 Abnormal findings on diagnostic imaging of heart and coronary circulation: Secondary | ICD-10-CM

## 2018-02-11 DIAGNOSIS — I351 Nonrheumatic aortic (valve) insufficiency: Secondary | ICD-10-CM

## 2018-02-11 NOTE — Telephone Encounter (Signed)
PATIENT REVIEWED  INFORMATION IN [place].  RN  LEFT MESSAGE ON VOICEMAIL OF UPCOMING LIMITED ECHO ANY QUESTION MAY CALL BACK.  ORDER PLACED.

## 2018-02-11 NOTE — Telephone Encounter (Signed)
-----   Message from Leonie Man, MD sent at 02/10/2018  9:50 PM EDT ----- Echo result: Normal EF.  Grade 2 diastolic dysfunction -> exertional shortness of breathe. Severely thickened aortic valve with no stenosis. ->  Aortic sclerosis. Mild mitral stenosis. Difficult to assess wall motion --  Recommended limited study with contrast.  -- lets check limited Echo with Definity Contrast to assess wall motion & get a better look @ the Aortic Valve + RV.  Glenetta Hew, MD  pls fwd to PCP: Redmond School, MD

## 2018-02-15 ENCOUNTER — Telehealth (HOSPITAL_COMMUNITY): Payer: Self-pay | Admitting: Cardiology

## 2018-02-19 NOTE — Telephone Encounter (Signed)
User: Cherie Dark A Date/time: 02/15/18 9:25 AM  Comment: Patient's *342* number does not have a VM set up and lmsg on *552* to CB to sch limited echo.   Context:  Outcome: Left Message  Phone number: [phone] Phone Type: Mobile  Comm. type: Telephone Call type: Outgoing  Contact: Silvestre Mesi L Relation to patient: Self

## 2018-02-20 ENCOUNTER — Other Ambulatory Visit: Payer: Self-pay

## 2018-02-20 ENCOUNTER — Ambulatory Visit (HOSPITAL_COMMUNITY): Payer: Medicare Other | Attending: Cardiovascular Disease

## 2018-02-20 DIAGNOSIS — I351 Nonrheumatic aortic (valve) insufficiency: Secondary | ICD-10-CM | POA: Diagnosis not present

## 2018-02-20 DIAGNOSIS — I2510 Atherosclerotic heart disease of native coronary artery without angina pectoris: Secondary | ICD-10-CM | POA: Insufficient documentation

## 2018-02-20 DIAGNOSIS — I080 Rheumatic disorders of both mitral and aortic valves: Secondary | ICD-10-CM | POA: Insufficient documentation

## 2018-02-20 DIAGNOSIS — G2581 Restless legs syndrome: Secondary | ICD-10-CM | POA: Insufficient documentation

## 2018-02-20 DIAGNOSIS — E785 Hyperlipidemia, unspecified: Secondary | ICD-10-CM | POA: Insufficient documentation

## 2018-02-20 DIAGNOSIS — R931 Abnormal findings on diagnostic imaging of heart and coronary circulation: Secondary | ICD-10-CM | POA: Insufficient documentation

## 2018-02-20 DIAGNOSIS — I10 Essential (primary) hypertension: Secondary | ICD-10-CM | POA: Diagnosis not present

## 2018-02-20 MED ORDER — PERFLUTREN LIPID MICROSPHERE
1.0000 mL | INTRAVENOUS | Status: AC | PRN
  Administered 2018-02-20: 2 mL via INTRAVENOUS

## 2018-02-21 ENCOUNTER — Telehealth: Payer: Self-pay | Admitting: *Deleted

## 2018-02-21 DIAGNOSIS — I351 Nonrheumatic aortic (valve) insufficiency: Secondary | ICD-10-CM

## 2018-02-21 NOTE — Telephone Encounter (Addendum)
LEFT MESSAGE TO CALL BACK FOR LIMITED ECHO RESULTS INFORMATION HAS BEEN RELEASE TO MY CHART   ORDER PLACED FOR MAY 2020 ROUTED TO DR [place].

## 2018-02-21 NOTE — Telephone Encounter (Signed)
-----   Message from Leonie Man, MD sent at 02/20/2018 11:39 PM EDT ----- Echocardiogram results: Normal pump function with grade 2 diastolic dysfunction (abnormal relaxation leading to higher pressures filling the heart --this can lead to exertional shortness of breath) -treated with high blood pressure management.-  The aortic valve is now probably mildly narrowed (mild stenosis), had previously been noted is just calcified but without narrowing.  Mild progression. The mitral valve is now just a little bit more narrowed (now mild to moderate stenosis as opposed to mild stenosis).  Follow-up in 1 year.  Glenetta Hew, MD  pls fwd to PCP: Redmond School, MD

## 2018-02-25 ENCOUNTER — Telehealth: Payer: Self-pay | Admitting: Cardiology

## 2018-02-25 NOTE — Telephone Encounter (Signed)
New Message:      Pt is calling back to get echo results

## 2018-02-25 NOTE — Telephone Encounter (Signed)
Patient made aware of results and verbalized his understanding.   Notes recorded by Leonie Man, MD on 02/20/2018 at 11:39 PM EDT Echocardiogram results: Normal pump function with grade 2 diastolic dysfunction (abnormal relaxation leading to higher pressures filling the heart --this can lead to exertional shortness of breath) -treated with high blood pressure management.-  The aortic valve is now probably mildly narrowed (mild stenosis), had previously been noted is just calcified but without narrowing. Mild progression. The mitral valve is now just a little bit more narrowed (now mild to moderate stenosis as opposed to mild stenosis).

## 2018-02-26 NOTE — Telephone Encounter (Signed)
Late entryPatient aware spoke to triage

## 2018-04-16 DIAGNOSIS — E119 Type 2 diabetes mellitus without complications: Secondary | ICD-10-CM | POA: Diagnosis not present

## 2018-04-16 DIAGNOSIS — E782 Mixed hyperlipidemia: Secondary | ICD-10-CM | POA: Diagnosis not present

## 2018-04-16 DIAGNOSIS — E039 Hypothyroidism, unspecified: Secondary | ICD-10-CM | POA: Diagnosis not present

## 2018-04-16 DIAGNOSIS — E663 Overweight: Secondary | ICD-10-CM | POA: Diagnosis not present

## 2018-04-16 DIAGNOSIS — Z1389 Encounter for screening for other disorder: Secondary | ICD-10-CM | POA: Diagnosis not present

## 2018-04-16 DIAGNOSIS — I10 Essential (primary) hypertension: Secondary | ICD-10-CM | POA: Diagnosis not present

## 2018-04-16 DIAGNOSIS — N183 Chronic kidney disease, stage 3 (moderate): Secondary | ICD-10-CM | POA: Diagnosis not present

## 2018-04-16 DIAGNOSIS — Z125 Encounter for screening for malignant neoplasm of prostate: Secondary | ICD-10-CM | POA: Diagnosis not present

## 2018-04-16 DIAGNOSIS — Z0001 Encounter for general adult medical examination with abnormal findings: Secondary | ICD-10-CM | POA: Diagnosis not present

## 2018-04-16 DIAGNOSIS — E063 Autoimmune thyroiditis: Secondary | ICD-10-CM | POA: Diagnosis not present

## 2018-04-16 DIAGNOSIS — Z6829 Body mass index (BMI) 29.0-29.9, adult: Secondary | ICD-10-CM | POA: Diagnosis not present

## 2018-04-16 DIAGNOSIS — I2510 Atherosclerotic heart disease of native coronary artery without angina pectoris: Secondary | ICD-10-CM | POA: Diagnosis not present

## 2018-04-16 DIAGNOSIS — R5383 Other fatigue: Secondary | ICD-10-CM | POA: Diagnosis not present

## 2018-07-15 DIAGNOSIS — Z23 Encounter for immunization: Secondary | ICD-10-CM | POA: Diagnosis not present

## 2018-08-01 ENCOUNTER — Other Ambulatory Visit: Payer: Self-pay

## 2018-08-01 ENCOUNTER — Encounter (HOSPITAL_COMMUNITY): Payer: Self-pay | Admitting: Internal Medicine

## 2018-08-01 ENCOUNTER — Inpatient Hospital Stay (HOSPITAL_COMMUNITY): Payer: Medicare Other | Attending: Internal Medicine | Admitting: Internal Medicine

## 2018-08-01 VITALS — BP 128/76 | HR 80 | Temp 98.2°F | Resp 18 | Wt 227.8 lb

## 2018-08-01 DIAGNOSIS — Z87891 Personal history of nicotine dependence: Secondary | ICD-10-CM | POA: Insufficient documentation

## 2018-08-01 DIAGNOSIS — Z8572 Personal history of non-Hodgkin lymphomas: Secondary | ICD-10-CM | POA: Diagnosis not present

## 2018-08-01 DIAGNOSIS — Z9011 Acquired absence of right breast and nipple: Secondary | ICD-10-CM | POA: Diagnosis not present

## 2018-08-01 DIAGNOSIS — Z853 Personal history of malignant neoplasm of breast: Secondary | ICD-10-CM | POA: Insufficient documentation

## 2018-08-01 DIAGNOSIS — C50919 Malignant neoplasm of unspecified site of unspecified female breast: Secondary | ICD-10-CM

## 2018-08-01 DIAGNOSIS — Z1509 Genetic susceptibility to other malignant neoplasm: Secondary | ICD-10-CM

## 2018-08-01 DIAGNOSIS — Z1589 Genetic susceptibility to other disease: Secondary | ICD-10-CM

## 2018-08-01 DIAGNOSIS — I2510 Atherosclerotic heart disease of native coronary artery without angina pectoris: Secondary | ICD-10-CM | POA: Insufficient documentation

## 2018-08-01 DIAGNOSIS — Z1502 Genetic susceptibility to malignant neoplasm of ovary: Secondary | ICD-10-CM

## 2018-08-01 NOTE — Progress Notes (Signed)
Diagnosis Malignant neoplasm of breast associated with mutation in CHEK2 gene ([place]) - Plan: MM DIAG BREAST TOMO UNI LEFT, CBC with Differential/Platelet, Comprehensive metabolic panel, Lactate dehydrogenase  Staging Cancer Staging Invasive ductal carcinoma of right breast, stage 2- 2012 s/p chemo and mastectomy Staging form: Breast, AJCC 7th Edition - Clinical: Stage IIA (T2, N0, cM0) - Signed by Baird Cancer, PA on 04/27/2011   Assessment and Plan:  1.  Stage II invasive ductal carcinoma of the right breast, ER positive HER-2 negative status post mastectomy and tamoxifen therapy. Tamoxifen was initiated in May 2012-May 2017 CHEK2 mutation.  Pt had left diagnostic mammogram 12/25/2017 and was negative.  PT will be set up for left diagnostic mammogram in 12/2018 and will RTC to go over results.  If negative, he will continue to be seen yearly in 12/2018.    2.  Hodgkin lymphoma treated in the early 80s with mantle radiation.  Pt reports he was diagnosed with right LN biopsy.   3.  Coronary artery disease/Carotid arterial disease.  Follow-up with Cardiology as recommended    Historical data obtained from note dated 07/27/2017:  Note:  CHEK2 mutations have been found to be associated with an increased risk of breast and other cancers. The estimated cancer risks vary widely and may be influenced by family history. Women with a CHEK2 deleterious mutation have approximately a 24% (no family history of breast cancer) to 48% (strong family history of breast cancer) lifetime risk of breast cancer and up to a 25% risk of a second breast cancer. Men may have an increased risk for male breast cancer of about 1%. Men and women may have an increased risk of colon cancer (~10% lifetime risk). Other cancers, including ovarian, uterine and thyroid, have been associated with CHEK2 mutations, but no specific risk has been established. According to the NCCN guidelines, women with CHEK2 mutations should consider  breast MRI's as a part of regular breast cancer screening. There are no other established management guidelines for individuals with disease causing mutations in CHEK2. However, management options based on other genes associated with high risks of breast cancer (such as the BRCA1 and BRCA2) may be appropriate to follow.   Pt reports family members were given options for testing but did not desire to be tested.     Current Status:  Pt is seen today for follow-up.  He reports he is doing well.  He denies fevers, chills, night sweats and has noted no adenopathy or changes in the breast.      Invasive ductal carcinoma of right breast, stage 2- 2012 s/p chemo and mastectomy   12/14/2010 Initial Diagnosis    Right needle core biopsy at 9 o'clock position positive for invasive ductal carcinoma    01/17/2011 Surgery    Right simple mastectomy showing a 2.1 cm invasive ductal carcinoma, clear marhins, 0/2 lymph nodes.  ER 96%, PR 84%, Ki-67 19%, Her2 negative.    03/08/2011 - 03/09/2016 Chemotherapy    Tamoxifen x 5 years    08/17/2014 Genetic Testing    Patient has genetic testing done forBreast/Ovarian cancer panel through [facility] . 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     08/09/2015 Pathology Results    BCI- Low risk of late recurrence years 5-10 (3.7%), low likelihood of benefit from extended endocrine therapy, low risk fo overall recurrence years 0-10 (6.6%), and low liklihood of benefit from extended endocrine therapy.  Hodgkin's [id number]   04/27/2011 Initial Diagnosis    Hodgkin's [id number]      Problem List Patient Active Problem List   Diagnosis Date Noted  . Aortic ejection murmur [I35.1] 01/31/2018  . Acute appendicitis [K35.80] 12/22/2015  . Genetic testing [Z13.79] 09/10/2014  . Malignant neoplasm of breast associated with mutation in CHEK2 gene ([place]) [C50.919, Z15.02, Z15.89, Z15.09] 09/08/2014  .  Cancer ([place]) [C80.1]   . Exertional dyspnea [R06.09] 07/27/2014  . Essential hypertension [I10] 07/27/2014  . Occlusion and stenosis of carotid artery without mention of cerebral infarction; Known RICA occlusion (CVA '07), partial LICA stenosis (10-93%) [I65.29] 11/12/2013  . Coronary atherosclerosis of native coronary artery - multivessel CAD, status post CABG x3 (LIMA-LAD, Left Radial-RI, SVG-RCA) [I25.10] 08/02/2013  . Fatigue [R53.83] 08/02/2013  . Dizzy spells [R42] 07/14/2013  . Orthostatic hypotension [I95.1] 07/14/2013  . Anemia [D64.9] 07/14/2013  . Obesity (BMI 30-39.9) [E66.9] 05/05/2013  . S/P CABG x 3  LIMA-LAD, free LRA to RI, SVG-RCA  - 04/09/13 [Z95.1] 04/09/2013  . Heart palpitations [R00.2] 04/02/2013  . Claudication of calf muscles ([place]) [I73.9] 04/02/2013  . Hyperlipidemia with target LDL less than 70 [E78.5]   . Invasive ductal carcinoma of right breast, stage 2- 2012 s/p chemo and mastectomy [C50.919] 04/27/2011  . Hodgkin's [id number] [C81.90] 04/27/2011  . Known RICA occlusion (CVA '07) partial LICA stenosis (42-70%) Jan 2014 [I65.21] 04/27/2011    Past Medical History Past Medical History:  Diagnosis Date  . Adie's pupil   . Anemia 07/14/2013  . Borderline diabetes   . CAD (coronary artery disease), native coronary artery 04/09/2013   Dist LM stenosis 70-80% (at trifurcation into LAD, CX, & 2 Ramus branches, 70% mid RCA);; b) Myoview 08/06/14: Low Risk, no Ischemia/infarction  . Depression 1983   "after I was dx'd w/CA; now just comes in spells"  (12/22/2015)  . Essential hypertension   . Exertional shortness of breath   . Hodgkin's lymphoma ([place]) 1983  . Hypercholesterolemia   . Hypothyroidism   . Invasive ductal carcinoma of breast, stage 2 ([employer]) 04/27/2011   chemo, mastectomy  . Left carotid artery partial occlusion 04/27/2011   CAROTIC DOPPLER 11/2013: < 40% R ICA stenosis, distal waveforms damped - suggests probably intracranial occlusoin, < 62% LICA,  normal vertebrals -- no change  . Mild mitral stenosis by prior echocardiogram 08/06/2014    Noted on echocardiogram  . Personal history of chemotherapy   . Restless leg syndrome   . Right carotid artery occlusion 04/27/2011  . S/P CABG x 3 04/09/2013   LIMA-LAD, fLRAD-RI, SVG-RCA; Echo 10/29/'15:  mild Conc LVH, no WMA, Gr 1 DD, Mod AoV Sclerosis w/ Mod AI, ~Mild-Mod MS   . Stroke [facility])    2007  . Thyroid disease     Past Surgical History Past Surgical History:  Procedure Laterality Date  . BREAST BIOPSY Right   . CARDIAC CATHETERIZATION  2014  . CATARACT EXTRACTION W/PHACO Right 06/21/2015   Procedure: CATARACT EXTRACTION PHACO AND INTRAOCULAR LENS PLACEMENT ([place]);  Surgeon: Tonny Branch, MD;  Location: [facility];  Service: Ophthalmology;  Laterality: Right;  CDE: 7.05  . CATARACT EXTRACTION W/PHACO Left 07/01/2015   Procedure: CATARACT EXTRACTION PHACO AND INTRAOCULAR LENS PLACEMENT ([facility]);  Surgeon: Tonny Branch, MD;  Location: [facility];  Service: Ophthalmology;  Laterality: Left;  CDE: 7.49  . COLONOSCOPY [name] 05/06/2015   Procedure: COLONOSCOPY;  Surgeon: Rogene Houston, MD;  Location: [facility];  Service: Endoscopy;  Laterality: [name];  930  .  CORONARY ARTERY BYPASS GRAFT [name] 04/08/2013   Procedure: CORONARY ARTERY BYPASS GRAFTING (CABG);  Surgeon: Ivin Poot, MD;  Location: [facility];  Service: Open Heart Surgery;  Laterality: [name];  Times 3 using left internal mammary artery; endoscopically harvested right saphenous vein and left radial artery  . INTRAOPERATIVE TRANSESOPHAGEAL ECHOCARDIOGRAM [name] 04/08/2013   Procedure: INTRAOPERATIVE TRANSESOPHAGEAL ECHOCARDIOGRAM;  Surgeon: Ivin Poot, MD;  Location: [facility];  Service: Open Heart Surgery;  Laterality: [name];  . LAPAROSCOPIC APPENDECTOMY [name] 12/23/2015   Procedure: APPENDECTOMY LAPAROSCOPIC;  Surgeon: Donnie Mesa, MD;  Location: [facility];  Service: General;  Laterality: [name];  . LEFT HEART CATHETERIZATION WITH CORONARY ANGIOGRAM [name] 04/07/2013    Procedure: LEFT HEART CATHETERIZATION WITH CORONARY ANGIOGRAM;  Surgeon: Leonie Man, MD;  Location: [facility] CATH LAB;  Service: Cardiovascular;  Laterality: [name];  . LYMPH NODE BIOPSY Left 1983   neck  . MASTECTOMY Right    w/axillary lymph node dissection  . NM TREADMILL MYOVIEW LTD  08/06/2014   Exercise 5:07 min, 6.3 METS; symptoms noted or extreme dyspnea and lightheadedness with some chest tightness. No EKG changes. No ischemia or infarction was normal EF.  Marland Kitchen RADIAL ARTERY HARVEST Left 04/08/2013   Procedure: RADIAL ARTERY HARVEST;  Surgeon: Ivin Poot, MD;  Location: [facility];  Service: Open Heart Surgery;  Laterality: Left;  . TONSILLECTOMY  1974  . TRANSTHORACIC ECHOCARDIOGRAM  08/06/2014    Normal EF 55-60%. Gr 1 DD - increased LVEDP. Moderate aortic valve thickening suggestive of AoV Sclerosis but not stenosis. Mild AI; MIld MS - mean gradient of 7 mmHg & HR of 122 bpm.    Family History Family History  Problem Relation Age of Onset  . Alzheimer's disease Mother   . Stroke Mother   . Hypertension Mother   . Hyperlipidemia Mother   . Diabetes Mother   . Hypertension Father   . Diabetes Paternal Grandmother   . Diabetes Paternal Grandfather   . Breast cancer Paternal Aunt   . Ovarian cancer Paternal Aunt      Social History  reports that he quit smoking about 36 years ago. His smoking use included cigarettes. He has a 15.00 pack-year smoking history. He has never used smokeless tobacco. He reports that he does not drink alcohol or use drugs.  Medications  Current Outpatient Medications:  .  acetaminophen (TYLENOL) 500 MG tablet, Take 500 mg by mouth every 6 (six) hours as needed for mild pain. , Disp: , Rfl:  .  albuterol (PROVENTIL HFA;VENTOLIN HFA) 108 (90 Base) MCG/ACT inhaler, Inhale 2 puffs into the lungs every 6 (six) hours as needed for wheezing or shortness of breath., Disp: , Rfl:  .  ALPRAZolam (XANAX) 1 MG tablet, Take 0.5 mg by mouth at bedtime as needed for  anxiety. , Disp: , Rfl:  .  aspirin EC 81 MG tablet, Take 1 tablet (81 mg total) by mouth daily., Disp: 90 tablet, Rfl: 3 .  Cholecalciferol (VITAMIN D-3 PO), Take 1 tablet by mouth daily. , Disp: , Rfl:  .  CINNAMON PO, Take by mouth., Disp: , Rfl:  .  citalopram (CELEXA) 20 MG tablet, Take 40 mg by mouth every evening. , Disp: , Rfl:  .  Cyanocobalamin (VITAMIN B 12 PO), Take by mouth., Disp: , Rfl:  .  diphenhydrAMINE (BENADRYL) 25 MG tablet, Take 25 mg by mouth daily as needed for allergies., Disp: , Rfl:  .  ECHINACEA EXTRACT PO, Take by mouth., Disp: , Rfl:  .  Garlic 440 MG CAPS, Take 500 mg by mouth daily., Disp: , Rfl:  .  Ginkgo Biloba 60 MG CAPS, Take 1 capsule by mouth daily., Disp: , Rfl:  .  levothyroxine (SYNTHROID, LEVOTHROID) 75 MCG tablet, Take 75 mcg by mouth daily. , Disp: , Rfl:  .  Magnesium 250 MG TABS, Take 1 tablet by mouth daily., Disp: , Rfl:  .  nitroGLYCERIN (NITROSTAT) 0.4 MG SL tablet, Place 1 tablet (0.4 mg total) under the tongue every 5 (five) minutes as needed for chest pain., Disp: 25 tablet, Rfl: 3 .  OVER THE COUNTER MEDICATION, Take 1 tablet by mouth daily. Ultra kidney complex, Disp: , Rfl:  .  rOPINIRole (REQUIP) 2 MG tablet, Take 2 mg by mouth at bedtime. , Disp: , Rfl:  .  rOPINIRole (REQUIP) 4 MG tablet, , Disp: , Rfl:  .  simvastatin (ZOCOR) 40 MG tablet, Take 40 mg by mouth at bedtime. , Disp: , Rfl:   Allergies Patient has no known allergies.  Review of Systems Review of Systems - Oncology ROS negative   Physical Exam  Vitals Wt Readings from Last 3 Encounters:  08/01/18 227 lb 12.8 oz (103.3 kg)  01/31/18 232 lb 9.6 oz (105.5 kg)  01/16/18 232 lb (105.2 kg)   Temp Readings from Last 3 Encounters:  08/01/18 98.2 F (36.8 C) (Oral)  01/16/18 98 F (36.7 C) (Oral)  07/27/17 98 F (36.7 C) (Oral)   BP Readings from Last 3 Encounters:  08/01/18 128/76  01/31/18 140/68  01/16/18 139/79   Pulse Readings from Last 3 Encounters:   08/01/18 80  01/31/18 83  01/16/18 77   Constitutional: Well-developed, well-nourished, and in no distress.   HENT: Head: Normocephalic and atraumatic.  Mouth/Throat: No oropharyngeal exudate. Mucosa moist. Eyes: Pupils are equal, round, and reactive to light. Conjunctivae are normal. No scleral icterus.  Neck: Normal range of motion. Neck supple. No JVD present.  Cardiovascular: Normal rate, regular rhythm and normal heart sounds.  Exam reveals no gallop and no friction rub.   No murmur heard. Pulmonary/Chest: Effort normal and breath sounds normal. No respiratory distress. No wheezes.No rales.  Midline chest scar from cardiac surgery.   Abdominal: Soft. Bowel sounds are normal. No distension. There is no tenderness. There is no guarding.  Musculoskeletal: No edema or tenderness.  Lymphadenopathy: No cervical, axillary or supraclavicular adenopathy.  Neurological: Alert and oriented to person, place, and time. No cranial nerve deficit.  Skin: Skin is warm and dry. No rash noted. No erythema. No pallor.  Psychiatric: Affect and judgment normal.  Breast exam:  Chaperone present.  Right mastectomy.  No dominant masses noted in left breast    Labs No visits with results within 3 Day(s) from this visit.  Latest known visit with results is:  Appointment on 07/27/2017  Component Date Value Ref Range Status  . WBC 07/27/2017 7.1  4.0 - 10.5 K/uL Final  . RBC 07/27/2017 4.53  4.22 - 5.81 MIL/uL Final  . Hemoglobin 07/27/2017 13.2  13.0 - 17.0 g/dL Final  . HCT 07/27/2017 41.4  39.0 - 52.0 % Final  . MCV 07/27/2017 91.4  78.0 - 100.0 fL Final  . MCH 07/27/2017 29.1  26.0 - 34.0 pg Final  . MCHC 07/27/2017 31.9  30.0 - 36.0 g/dL Final  . RDW 07/27/2017 13.5  11.5 - 15.5 % Final  . Platelets 07/27/2017 214  150 - 400 K/uL Final  . Neutrophils Relative % 07/27/2017 66  % Final  .  Neutro Abs 07/27/2017 4.7  1.7 - 7.7 K/uL Final  . Lymphocytes Relative 07/27/2017 20  % Final  . Lymphs Abs  07/27/2017 1.4  0.7 - 4.0 K/uL Final  . Monocytes Relative 07/27/2017 9  % Final  . Monocytes Absolute 07/27/2017 0.6  0.1 - 1.0 K/uL Final  . Eosinophils Relative 07/27/2017 4  % Final  . Eosinophils Absolute 07/27/2017 0.3  0.0 - 0.7 K/uL Final  . Basophils Relative 07/27/2017 1  % Final  . Basophils Absolute 07/27/2017 0.0  0.0 - 0.1 K/uL Final  . Sodium 07/27/2017 137  135 - 145 mmol/L Final  . Potassium 07/27/2017 4.1  3.5 - 5.1 mmol/L Final  . Chloride 07/27/2017 104  101 - 111 mmol/L Final  . CO2 07/27/2017 25  22 - 32 mmol/L Final  . Glucose, Bld 07/27/2017 187* 65 - 99 mg/dL Final  . BUN 07/27/2017 12  6 - 20 mg/dL Final  . Creatinine, Ser 07/27/2017 1.28* 0.61 - 1.24 mg/dL Final  . Calcium 07/27/2017 8.7* 8.9 - 10.3 mg/dL Final  . Total Protein 07/27/2017 6.4* 6.5 - 8.1 g/dL Final  . Albumin 07/27/2017 3.6  3.5 - 5.0 g/dL Final  . AST 07/27/2017 27  15 - 41 U/L Final  . ALT 07/27/2017 20  17 - 63 U/L Final  . Alkaline Phosphatase 07/27/2017 58  38 - 126 U/L Final  . Total Bilirubin 07/27/2017 0.7  0.3 - 1.2 mg/dL Final  . GFR calc non Af Amer 07/27/2017 56* >60 mL/min Final  . GFR calc Af Amer 07/27/2017 >60  >60 mL/min Final   Comment: (NOTE) The eGFR has been calculated using the CKD EPI equation. This calculation has not been validated in all clinical situations. eGFR's persistently <60 mL/min signify possible Chronic Kidney Disease.   . Anion gap 07/27/2017 8  5 - 15 Final     Pathology Orders Placed This Encounter  Procedures  . MM DIAG BREAST TOMO UNI LEFT    Standing Status:   Future    Standing Expiration Date:   08/02/2019    Order Specific Question:   Reason for Exam (SYMPTOM  OR DIAGNOSIS REQUIRED)    Answer:   right male breast cancer    Order Specific Question:   Preferred imaging location?    Answer:   [facility]  . CBC with Differential/Platelet    Standing Status:   Future    Standing Expiration Date:   08/01/2020  . Comprehensive  metabolic panel    Standing Status:   Future    Standing Expiration Date:   08/01/2020  . Lactate dehydrogenase    Standing Status:   Future    Standing Expiration Date:   08/01/2020       Zoila Shutter MD

## 2018-08-02 ENCOUNTER — Ambulatory Visit (HOSPITAL_COMMUNITY): Payer: Medicare Other | Admitting: Internal Medicine

## 2018-08-08 DIAGNOSIS — Z23 Encounter for immunization: Secondary | ICD-10-CM | POA: Diagnosis not present

## 2018-09-09 DIAGNOSIS — J449 Chronic obstructive pulmonary disease, unspecified: Secondary | ICD-10-CM | POA: Diagnosis not present

## 2018-09-09 DIAGNOSIS — I10 Essential (primary) hypertension: Secondary | ICD-10-CM | POA: Diagnosis not present

## 2018-09-09 DIAGNOSIS — G2581 Restless legs syndrome: Secondary | ICD-10-CM | POA: Diagnosis not present

## 2018-09-09 DIAGNOSIS — N390 Urinary tract infection, site not specified: Secondary | ICD-10-CM | POA: Diagnosis not present

## 2018-09-09 DIAGNOSIS — G4733 Obstructive sleep apnea (adult) (pediatric): Secondary | ICD-10-CM | POA: Diagnosis not present

## 2018-10-14 DIAGNOSIS — Z1389 Encounter for screening for other disorder: Secondary | ICD-10-CM | POA: Diagnosis not present

## 2018-10-14 DIAGNOSIS — E663 Overweight: Secondary | ICD-10-CM | POA: Diagnosis not present

## 2018-10-14 DIAGNOSIS — Z6829 Body mass index (BMI) 29.0-29.9, adult: Secondary | ICD-10-CM | POA: Diagnosis not present

## 2018-10-14 DIAGNOSIS — F419 Anxiety disorder, unspecified: Secondary | ICD-10-CM | POA: Diagnosis not present

## 2018-10-14 DIAGNOSIS — F329 Major depressive disorder, single episode, unspecified: Secondary | ICD-10-CM | POA: Diagnosis not present

## 2018-10-16 ENCOUNTER — Encounter: Payer: Self-pay | Admitting: Orthopedic Surgery

## 2018-10-16 ENCOUNTER — Ambulatory Visit (INDEPENDENT_AMBULATORY_CARE_PROVIDER_SITE_OTHER): Payer: Medicare Other

## 2018-10-16 ENCOUNTER — Ambulatory Visit (INDEPENDENT_AMBULATORY_CARE_PROVIDER_SITE_OTHER): Payer: Medicare Other | Admitting: Orthopedic Surgery

## 2018-10-16 VITALS — BP 129/68 | HR 81 | Ht 75.0 in | Wt 232.0 lb

## 2018-10-16 DIAGNOSIS — M25512 Pain in left shoulder: Secondary | ICD-10-CM | POA: Diagnosis not present

## 2018-10-16 DIAGNOSIS — S46212A Strain of muscle, fascia and tendon of other parts of biceps, left arm, initial encounter: Secondary | ICD-10-CM | POA: Diagnosis not present

## 2018-10-16 NOTE — Patient Instructions (Signed)
Proximal Biceps Tendon Disruption    The proximal biceps tendon is a strong cord of tissue that connects the biceps muscle-which is the muscle on the front of the upper arm-to the shoulder blade. A proximal biceps tendon disruption can include a partial or complete tear of the tendon near where it connects to the bone of the shoulder.   This injury can interfere with your ability to lift your arm in front of your body, stabilize your shoulder, bend your elbow, and turn your hand palm-up (supination).  What are the causes?  This condition happens when too much force is placed on the tendon. This excess force may be caused by:   The elbow being suddenly straightened from a bent position because of an external force. This could happen, for example, while catching a heavy weight or being pulled when waterskiing.   Wear and tear from physical activity.   Breaking a fall with your hand.  What increases the risk?  The following factors may make you more likely to develop this condition:   Playing contact sports.   Doing activities or sports that involve throwing or overhead movements, such as racket sports, gymnastics, or baseball.   Doing activities or sports that involve putting sudden force on the arm, such as weightlifting or waterskiing.   Having a weakened tendon because of:  ? Long-lasting (chronic) biceps tendinitis.  ? Certain medical conditions, such as diabetes or rheumatoid arthritis.  ? Repeated corticosteroid use.  ? Repetitive overhead movements.  What are the signs or symptoms?  Symptoms of this condition may include:   Sudden sharp pain in the front of the shoulder. Pain may get worse during certain movements, such as:  ? Lifting or carrying objects.  ? Straightening the elbow.  ? Throwing or using overhead movements.   Inflammation or a feeling of unusual warmth on the front of the shoulder.   Painful tightening (spasm) of the biceps muscle.   A bulge on the inside of the upper arm when the  elbow is bent.   Bruising in the shoulder or upper arm. This may develop 24-48 hours after the tendon is injured.   Limited range of motion of the shoulder and elbow.   Weakness in the elbow and forearm when:  ? Bending the elbow.  ? Rotating the wrist.  How is this diagnosed?  This condition may be diagnosed based on:   Your symptoms and medical history.   A physical exam. Your health care provider may test the strength and range of motion of your shoulder and elbow.   Imaging tests, such as:  ? X-rays.  ? MRI.  ? Ultrasound.  How is this treated?  Treatment depends on the severity of the condition. Treatment may include:   Medicines to help relieve pain and inflammation.   Resting and icing the injured area.   Avoiding certain activities that put stress on your shoulder.   Physical therapy.   Surgery to repair the tear. This may be needed if nonsurgical treatments do not improve your condition.   One or more injections of medicines (corticosteroids) into your upper arm to help reduce inflammation. This treatment is rare.  Follow these instructions at home:  Managing pain, stiffness, and swelling          If directed, put ice on the injured area:  ? Put ice in a plastic bag.  ? Place a towel between your skin and the bag.  ? Leave the ice on   for 20 minutes, 2-3 times a day.   If directed, apply heat to the affected area before you exercise or as often as told by your health care provider. Use the heat source that your health care provider recommends, such as a moist heat pack or a heating pad.  ? Place a towel between your skin and the heat source.  ? Leave the heat on for 20-30 minutes.  ? Remove the heat if your skin turns bright red. This is especially important if you are unable to feel pain, heat, or cold. You may have a greater risk of getting burned.   Move your fingers often to avoid stiffness and to lessen swelling.   Raise (elevate) the injured area while you are sitting or lying  down.  Activity   Return to your normal activities as told by your health care provider. Ask your health care provider what activities are safe for you.   Avoid activities that cause pain or make your condition worse.   Do not lift anything that is heavier than 10 lb (4.5 kg), or the limit that you are told, until your health care provider says that it is safe.   Do exercises as told by your physical therapist or health care provider.  General instructions   Take over-the-counter and prescription medicines only as told by your health care provider.   Do not use any products that contain nicotine or tobacco, such as cigarettes and e-cigarettes. If you need help quitting, ask your health care provider.   Keep all follow-up visits as told by your health care provider. This is important.  How is this prevented?  Take these steps to help prevent reinjury:   Warm up and stretch before being active.   Cool down and stretch after being active.   Give your body time to rest between periods of activity.   Make sure you use equipment that fits you.   Be safe and responsible while being active. This will help you avoid falls.   Maintain physical fitness, including strength and flexibility.  Contact a health care provider if:   You have symptoms that get worse or do not get better after 2 weeks of treatment.   You develop new symptoms.  Get help right away if:   You have severe pain.   You develop pain or numbness in your hand.   Your hand feels unusually cold.   Your fingernails turn a dark color, such as blue or gray.  Summary   A proximal biceps tendon disruption can include a partial or complete tear of the tendon near where it connects to the bone of the shoulder.   This condition happens when too much force is placed on the tendon.   Treatment may include resting and icing the injured area.   Avoid activities that cause pain or make your condition worse.  This information is not intended to replace  advice given to you by your health care provider. Make sure you discuss any questions you have with your health care provider.  Document Released: 09/25/2005 Document Revised: 02/18/2018 Document Reviewed: 02/18/2018  [employer] Interactive Patient Education  2019 [employer]

## 2018-10-16 NOTE — Progress Notes (Signed)
NEW PROBLEM  OFFICE VISIT  Chief Complaint  Patient presents with  . Shoulder Injury    left biceps     68 year old male comes in as a walk-in patient complaining of left shoulder pain.  On January 7 yesterday he was working on the lawn more felt a pop near his biceps tendon presents with a Popeye deformity  Location left shoulder  Duration 1 day  Quality dull ache  Severity 3 out of 10  Associated with painful swelling ecchymosis and decreased range of motion   Review of Systems  Constitutional: Negative for chills.  Skin: Negative.   Neurological: Negative for tingling and sensory change.     Past Medical History:  Diagnosis Date  . Adie's pupil   . Anemia 07/14/2013  . Borderline diabetes   . CAD (coronary artery disease), native coronary artery 04/09/2013   Dist LM stenosis 70-80% (at trifurcation into LAD, CX, & 2 Ramus branches, 70% mid RCA);; b) Myoview 08/06/14: Low Risk, no Ischemia/infarction  . Depression 1983   "after I was dx'd w/CA; now just comes in spells"  (12/22/2015)  . Essential hypertension   . Exertional shortness of breath   . Hodgkin's lymphoma ([place]) 1983  . Hypercholesterolemia   . Hypothyroidism   . Invasive ductal carcinoma of breast, stage 2 ([place]) 04/27/2011   chemo, mastectomy  . Left carotid artery partial occlusion 04/27/2011   CAROTIC DOPPLER 11/2013: < 40% R ICA stenosis, distal waveforms damped - suggests probably intracranial occlusoin, < 50% LICA, normal vertebrals -- no change  . Mild mitral stenosis by prior echocardiogram 08/06/2014    Noted on echocardiogram  . Personal history of chemotherapy   . Restless leg syndrome   . Right carotid artery occlusion 04/27/2011  . S/P CABG x 3 04/09/2013   LIMA-LAD, fLRAD-RI, SVG-RCA; Echo 10/29/'15:  mild Conc LVH, no WMA, Gr 1 DD, Mod AoV Sclerosis w/ Mod AI, ~Mild-Mod MS   . Stroke [facility])    2007  . Thyroid disease     Past Surgical History:  Procedure Laterality Date  . BREAST BIOPSY  Right   . CARDIAC CATHETERIZATION  2014  . CATARACT EXTRACTION W/PHACO Right 06/21/2015   Procedure: CATARACT EXTRACTION PHACO AND INTRAOCULAR LENS PLACEMENT ([place]);  Surgeon: Tonny Branch, MD;  Location: [facility];  Service: Ophthalmology;  Laterality: Right;  CDE: 7.05  . CATARACT EXTRACTION W/PHACO Left 07/01/2015   Procedure: CATARACT EXTRACTION PHACO AND INTRAOCULAR LENS PLACEMENT ([facility]);  Surgeon: Tonny Branch, MD;  Location: [facility];  Service: Ophthalmology;  Laterality: Left;  CDE: 7.49  . COLONOSCOPY [name] 05/06/2015   Procedure: COLONOSCOPY;  Surgeon: Rogene Houston, MD;  [facility];  Service: Endoscopy;  Laterality: [name];  930  . CORONARY ARTERY BYPASS GRAFT [name] 04/08/2013   Procedure: CORONARY ARTERY BYPASS GRAFTING (CABG);  Surgeon: Ivin Poot, MD;  Location: [place];  Service: Open Heart Surgery;  Laterality: [name];  Times 3 using left internal mammary artery; endoscopically harvested right saphenous vein and left radial artery  . INTRAOPERATIVE TRANSESOPHAGEAL ECHOCARDIOGRAM [name] 04/08/2013   Procedure: INTRAOPERATIVE TRANSESOPHAGEAL ECHOCARDIOGRAM;  Surgeon: Ivin Poot, MD;  Location: [place];  Service: Open Heart Surgery;  Laterality: [name];  . LAPAROSCOPIC APPENDECTOMY [name] 12/23/2015   Procedure: APPENDECTOMY LAPAROSCOPIC;  Surgeon: Donnie Mesa, MD;  Location: [place];  Service: General;  Laterality: [name];  . [place] [name] 04/07/2013   Procedure: LEFT HEART CATHETERIZATION WITH CORONARY ANGIOGRAM;  Surgeon: Leonie Man,  MD;  Location: [place] CATH LAB;  Service: Cardiovascular;  Laterality: [name];  . LYMPH NODE BIOPSY Left 1983   neck  . MASTECTOMY Right    w/axillary lymph node dissection  . NM TREADMILL MYOVIEW LTD  08/06/2014   Exercise 5:07 min, 6.3 METS; symptoms noted or extreme dyspnea and lightheadedness with some chest tightness. No EKG changes. No ischemia or infarction was normal EF.  Marland Kitchen RADIAL ARTERY HARVEST Left 04/08/2013   Procedure:  RADIAL ARTERY HARVEST;  Surgeon: Ivin Poot, MD;  Location: [place];  Service: Open Heart Surgery;  Laterality: Left;  . TONSILLECTOMY  1974  . TRANSTHORACIC ECHOCARDIOGRAM  08/06/2014    Normal EF 55-60%. Gr 1 DD - increased LVEDP. Moderate aortic valve thickening suggestive of AoV Sclerosis but not stenosis. Mild AI; MIld MS - mean gradient of 7 mmHg & HR of 122 bpm.    Family History  Problem Relation Age of Onset  . Alzheimer's disease Mother   . Stroke Mother   . Hypertension Mother   . Hyperlipidemia Mother   . Diabetes Mother   . Hypertension Father   . Diabetes Paternal Grandmother   . Diabetes Paternal Grandfather   . Breast cancer Paternal Aunt   . Ovarian cancer Paternal Aunt    Social History   Tobacco Use  . Smoking status: Former Smoker    Packs/day: 1.00    Years: 15.00    Pack years: 15.00    Types: Cigarettes    Last attempt to quit: 06/09/1982    Years since quitting: 36.3  . Smokeless tobacco: Never Used  Substance Use Topics  . Alcohol use: No  . Drug use: No    No Known Allergies  Current Meds  Medication Sig  . acetaminophen (TYLENOL) 500 MG tablet Take 500 mg by mouth every 6 (six) hours as needed for mild pain.   Marland Kitchen albuterol (PROVENTIL HFA;VENTOLIN HFA) 108 (90 Base) MCG/ACT inhaler Inhale 2 puffs into the lungs every 6 (six) hours as needed for wheezing or shortness of breath.  . ALPRAZolam (XANAX) 1 MG tablet Take 0.5 mg by mouth at bedtime as needed for anxiety.   Marland Kitchen aspirin EC 81 MG tablet Take 1 tablet (81 mg total) by mouth daily.  . Cholecalciferol (VITAMIN D-3 PO) Take 1 tablet by mouth daily.   Marland Kitchen CINNAMON PO Take by mouth.  . citalopram (CELEXA) 20 MG tablet Take 40 mg by mouth every evening.   . Cyanocobalamin (VITAMIN B 12 PO) Take by mouth.  . diphenhydrAMINE (BENADRYL) 25 MG tablet Take 25 mg by mouth daily as needed for allergies.  Marland Kitchen ECHINACEA EXTRACT PO Take by mouth.  . Garlic 629 MG CAPS Take 500 mg by mouth daily.  . Ginkgo  Biloba 60 MG CAPS Take 1 capsule by mouth daily.  Marland Kitchen levothyroxine (SYNTHROID, LEVOTHROID) 75 MCG tablet Take 75 mcg by mouth daily.   . Magnesium 250 MG TABS Take 1 tablet by mouth daily.  . nitroGLYCERIN (NITROSTAT) 0.4 MG SL tablet Place 1 tablet (0.4 mg total) under the tongue every 5 (five) minutes as needed for chest pain.  Marland Kitchen OVER THE COUNTER MEDICATION Take 1 tablet by mouth daily. Ultra kidney complex  . rOPINIRole (REQUIP) 2 MG tablet Take 2 mg by mouth at bedtime.   Marland Kitchen rOPINIRole (REQUIP) 4 MG tablet   . simvastatin (ZOCOR) 40 MG tablet Take 40 mg by mouth at bedtime.     BP 129/68   Pulse 81   Ht  6\' 3"  (1.905 m)   Wt 232 lb (105.2 kg)   BMI 29.00 kg/m   Physical Exam Vitals signs reviewed.  Constitutional:      Appearance: He is well-developed.     Comments: Vital signs have been reviewed and are stable. Gen. appearance the patient is well-developed and well-nourished with normal grooming and hygiene.   Skin:    General: Skin is warm and dry.     Findings: No erythema.  Neurological:     Mental Status: He is alert and oriented to person, place, and time.     Right Shoulder Exam  Right shoulder exam is normal.  Tenderness  The patient is experiencing no tenderness.  Range of Motion  The patient has normal right shoulder ROM.  Muscle Strength  The patient has normal right shoulder strength.  Tests  Apprehension: negative  Other  Erythema: absent Sensation: normal Pulse: present   Left Shoulder Exam   Tenderness  The patient is experiencing tenderness in the biceps tendon.  Range of Motion  Active abduction: abnormal  Passive abduction: abnormal  Extension: normal  External rotation: normal  Forward flexion: abnormal   Muscle Strength  The patient has normal left shoulder strength. Abduction: 5/5  Internal rotation: 5/5  External rotation: 5/5  Supraspinatus: 5/5  Subscapularis: 5/5  Biceps: 4/5   Tests  Apprehension: negative Cross arm:  negative Impingement: negative Drop arm: negative Sulcus: absent  Other  Erythema: absent Sensation: normal Pulse: present         MEDICAL DECISION SECTION  Xrays were done at [facility]  My independent reading of xrays:  Glenohumeral joint looks normal except for subtle proximal migration of the humerus which may suggest chronic rotator cuff disease which goes along with his biceps tendon pathology  Encounter Diagnoses  Name Primary?  . Acute pain of left shoulder Yes  . Rupture of left proximal biceps tendon, initial encounter     PLAN: (Rx., injectx, surgery, frx, mri/ct) SYMPTOMATIC TREATMENT   HEAT   ICE   NSAID OTC   No orders of the defined types were placed in this encounter.   Arther Abbott, MD  10/16/2018 4:25 PM

## 2018-12-25 ENCOUNTER — Other Ambulatory Visit (HOSPITAL_COMMUNITY): Payer: Self-pay | Admitting: Internal Medicine

## 2018-12-25 DIAGNOSIS — Z1589 Genetic susceptibility to other disease: Principal | ICD-10-CM

## 2018-12-25 DIAGNOSIS — Z1502 Genetic susceptibility to malignant neoplasm of ovary: Principal | ICD-10-CM

## 2018-12-25 DIAGNOSIS — C50919 Malignant neoplasm of unspecified site of unspecified female breast: Secondary | ICD-10-CM

## 2018-12-25 DIAGNOSIS — Z1509 Genetic susceptibility to other malignant neoplasm: Principal | ICD-10-CM

## 2018-12-31 ENCOUNTER — Other Ambulatory Visit: Payer: Self-pay

## 2018-12-31 ENCOUNTER — Ambulatory Visit (HOSPITAL_COMMUNITY): Admission: RE | Admit: 2018-12-31 | Payer: Medicare Other | Source: Ambulatory Visit

## 2018-12-31 ENCOUNTER — Other Ambulatory Visit (HOSPITAL_COMMUNITY): Payer: Medicare Other

## 2018-12-31 ENCOUNTER — Inpatient Hospital Stay (HOSPITAL_COMMUNITY): Payer: Medicare Other | Attending: Internal Medicine

## 2018-12-31 ENCOUNTER — Ambulatory Visit (HOSPITAL_COMMUNITY)
Admission: RE | Admit: 2018-12-31 | Discharge: 2018-12-31 | Disposition: A | Discharge status: Home / Self Care | Payer: Medicare Other | Source: Ambulatory Visit | Attending: Internal Medicine | Admitting: Internal Medicine

## 2018-12-31 DIAGNOSIS — Z1502 Genetic susceptibility to malignant neoplasm of ovary: Secondary | ICD-10-CM

## 2018-12-31 DIAGNOSIS — N62 Hypertrophy of breast: Secondary | ICD-10-CM | POA: Insufficient documentation

## 2018-12-31 DIAGNOSIS — C50919 Malignant neoplasm of unspecified site of unspecified female breast: Secondary | ICD-10-CM

## 2018-12-31 DIAGNOSIS — C50921 Malignant neoplasm of unspecified site of right male breast: Secondary | ICD-10-CM | POA: Insufficient documentation

## 2018-12-31 DIAGNOSIS — Z1509 Genetic susceptibility to other malignant neoplasm: Secondary | ICD-10-CM | POA: Insufficient documentation

## 2018-12-31 DIAGNOSIS — Z1589 Genetic susceptibility to other disease: Secondary | ICD-10-CM

## 2018-12-31 DIAGNOSIS — Z853 Personal history of malignant neoplasm of breast: Secondary | ICD-10-CM | POA: Insufficient documentation

## 2018-12-31 DIAGNOSIS — I2510 Atherosclerotic heart disease of native coronary artery without angina pectoris: Secondary | ICD-10-CM | POA: Insufficient documentation

## 2018-12-31 DIAGNOSIS — N641 Fat necrosis of breast: Secondary | ICD-10-CM | POA: Diagnosis not present

## 2018-12-31 LAB — CBC WITH DIFFERENTIAL/PLATELET
ABS IMMATURE GRANULOCYTES: 0.02 10*3/uL (ref 0.00–0.07)
BASOS ABS: 0.1 10*3/uL (ref 0.0–0.1)
BASOS PCT: 1 %
EOS ABS: 0.2 10*3/uL (ref 0.0–0.5)
Eosinophils Relative: 3 %
HCT: 46.7 % (ref 39.0–52.0)
Hemoglobin: 15.1 g/dL (ref 13.0–17.0)
IMMATURE GRANULOCYTES: 0 %
Lymphocytes Relative: 20 %
Lymphs Abs: 1.5 10*3/uL (ref 0.7–4.0)
MCH: 29.7 pg (ref 26.0–34.0)
MCHC: 32.3 g/dL (ref 30.0–36.0)
MCV: 91.7 fL (ref 80.0–100.0)
Monocytes Absolute: 0.7 10*3/uL (ref 0.1–1.0)
Monocytes Relative: 9 %
NEUTROS ABS: 5.3 10*3/uL (ref 1.7–7.7)
NEUTROS PCT: 67 %
NRBC: 0 % (ref 0.0–0.2)
PLATELETS: 218 10*3/uL (ref 150–400)
RBC: 5.09 MIL/uL (ref 4.22–5.81)
RDW: 13.5 % (ref 11.5–15.5)
WBC: 7.7 10*3/uL (ref 4.0–10.5)

## 2018-12-31 LAB — COMPREHENSIVE METABOLIC PANEL
ALT: 29 U/L (ref 0–44)
AST: 28 U/L (ref 15–41)
Albumin: 4 g/dL (ref 3.5–5.0)
Alkaline Phosphatase: 75 U/L (ref 38–126)
Anion gap: 8 (ref 5–15)
BUN: 14 mg/dL (ref 8–23)
CALCIUM: 9 mg/dL (ref 8.9–10.3)
CO2: 25 mmol/L (ref 22–32)
CREATININE: 1.18 mg/dL (ref 0.61–1.24)
Chloride: 105 mmol/L (ref 98–111)
GFR calc non Af Amer: >60 mL/min (ref >60)
GLUCOSE: 165 mg/dL — AB (ref 70–99)
POTASSIUM: 4.2 mmol/L (ref 3.5–5.1)
SODIUM: 138 mmol/L (ref 135–145)
TOTAL PROTEIN: 6.7 g/dL (ref 6.5–8.1)
Total Bilirubin: 0.5 mg/dL (ref 0.3–1.2)

## 2018-12-31 LAB — LACTATE DEHYDROGENASE: LDH: 145 U/L (ref 98–192)

## 2019-01-08 ENCOUNTER — Other Ambulatory Visit: Payer: Self-pay

## 2019-01-08 ENCOUNTER — Inpatient Hospital Stay (HOSPITAL_COMMUNITY): Payer: Medicare Other | Attending: Hematology | Admitting: Nurse Practitioner

## 2019-01-08 DIAGNOSIS — Z8572 Personal history of non-Hodgkin lymphomas: Secondary | ICD-10-CM | POA: Diagnosis not present

## 2019-01-08 DIAGNOSIS — Z853 Personal history of malignant neoplasm of breast: Secondary | ICD-10-CM | POA: Insufficient documentation

## 2019-01-08 DIAGNOSIS — C50911 Malignant neoplasm of unspecified site of right female breast: Secondary | ICD-10-CM

## 2019-01-08 NOTE — Assessment & Plan Note (Signed)
1.  Stage II invasive ductal carcinoma the right breast: - He was initially diagnosed 12/14/2010 with invasive ductal carcinoma the right breast ER+/HER2- - Had a biopsy done on 01/17/2011. - He had a right simple mastectomy on 03/08/2011 showing a 2.1 cm invasive ductal carcinoma with clear margins, 0 out of 2 lymph nodes.  ER 96%, PR 84%, Ki-67 19%, HER-2 negative. - He completed his chemotherapy and started tamoxifen in May 2012 and completed May 2017. - Oncotype score was 14. -Last mammogram on 12/31/2018 was B RADS category 2 benign. -He had labs drawn on 12/31/2018.  LDH was 145.  Creatinine was 1.18 which is stable.  All other labs were within normal limits. - On today's exam it did not reveal any adenopathy or lumps present. - He will follow-up in 1 year with repeat mammogram and labs.  2.  Hodgkin lymphoma: - He was treated in the early 80s with mantle radiation. - He was diagnosed with a right lymph node biopsy. - He is under observation at this time

## 2019-01-08 NOTE — Patient Instructions (Signed)
[facility] at [facility] Discharge Instructions  Follow up in 1 year with labs and mammogram   Thank you for choosing [facility] at [facility] to provide your oncology and hematology care.  To afford each patient quality time with our provider, please arrive at least 15 minutes before your scheduled appointment time.   If you have a lab appointment with the Cancer Center please come in thru the  Main Entrance and check in at the main information desk  You need to re-schedule your appointment should you arrive 10 or more minutes late.  We strive to give you quality time with our providers, and arriving late affects you and other patients whose appointments are after yours.  Also, if you no show three or more times for appointments you may be dismissed from the clinic at the providers discretion.     Again, thank you for choosing [facility].  Our hope is that these requests will decrease the amount of time that you wait before being seen by our physicians.       _____________________________________________________________  Should you have questions after your visit to [facility], please contact our office at [phone] between the hours of 8:00 a.m. and 4:30 p.m.  Voicemails left after 4:00 p.m. will not be returned until the following business day.  For prescription refill requests, have your pharmacy contact our office and allow 72 hours.    Cancer Center Support Programs:   > Cancer Support Group  2nd Tuesday of the month 1pm-2pm, Journey Room    

## 2019-01-08 NOTE — Progress Notes (Signed)
[place], [place] [address]   CLINIC:  Medical Oncology/Hematology  PCP:  [facility], [place] [address] [phone]   REASON FOR VISIT: Follow-up for invasive ductal carcinoma the right breast  CURRENT THERAPY: Observation per NCCN guidelines  BRIEF ONCOLOGIC HISTORY:    Invasive ductal carcinoma of right breast, stage 2- 2012 s/p chemo and mastectomy   12/14/2010 Initial Diagnosis    Right needle core biopsy at 9 o'clock position positive for invasive ductal carcinoma    01/17/2011 Surgery    Right simple mastectomy showing a 2.1 cm invasive ductal carcinoma, clear marhins, 0/2 lymph nodes.  ER 96%, PR 84%, Ki-67 19%, Her2 negative.    03/08/2011 - 03/09/2016 Chemotherapy    Tamoxifen x 5 years    08/17/2014 Genetic Testing    Patient has genetic testing done forBreast/Ovarian cancer panel through [facility] . 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     08/09/2015 Pathology Results    BCI- Low risk of late recurrence years 5-10 (3.7%), low likelihood of benefit from extended endocrine therapy, low risk fo overall recurrence years 0-10 (6.6%), and low liklihood of benefit from extended endocrine therapy.     Hodgkin's [id number]   04/27/2011 Initial Diagnosis    Hodgkin's [id number]      CANCER STAGING: Cancer Staging Invasive ductal carcinoma of right breast, stage 2- 2012 s/p chemo and mastectomy Staging form: Breast, AJCC 7th Edition - Clinical: Stage IIA (T2, N0, cM0) - Signed by Baird Cancer, PA on 04/27/2011    INTERVAL HISTORY:  [name] 68 y.o. male returns for routine follow-up for invasive ductal carcinoma the right breast.  He is here today alone.  He is doing well with no complaints at this time.  He reports he does have restless legs that bother him that is being managed by Dr. Luan Pulling. Denies any nausea, vomiting,  or diarrhea. Denies any new pains. Had not noticed any recent bleeding such as epistaxis, hematuria or hematochezia. Denies recent chest pain on exertion, shortness of breath on minimal exertion, pre-syncopal episodes, or palpitations. Denies any numbness or tingling in hands or feet. Denies any recent fevers, infections, or recent hospitalizations. Patient reports appetite at 100% and energy level at 25%.  Maintaining his weight and eating well at this time.   REVIEW OF SYSTEMS:  Review of Systems  All other systems reviewed and are negative.    PAST MEDICAL/SURGICAL HISTORY:  Past Medical History:  Diagnosis Date   Adie's pupil    Anemia 07/14/2013   Borderline diabetes    CAD (coronary artery disease), native coronary artery 04/09/2013   Dist LM stenosis 70-80% (at trifurcation into LAD, CX, & 2 Ramus branches, 70% mid RCA);; b) Myoview 08/06/14: Low Risk, no Ischemia/infarction   Depression 1983   "after I was dx'd w/CA; now just comes in spells"  (12/22/2015)   Essential hypertension    Exertional shortness of breath    Hodgkin's lymphoma ([place]) 1983   Hypercholesterolemia    Hypothyroidism    Invasive ductal carcinoma of breast, stage 2 ([place]) 04/27/2011   chemo, mastectomy   Left carotid artery partial occlusion 04/27/2011   CAROTIC DOPPLER 11/2013: < 40% R ICA stenosis, distal waveforms damped - suggests probably intracranial occlusoin, < 57% LICA, normal vertebrals -- no change   Mild mitral stenosis by prior echocardiogram 08/06/2014    Noted on echocardiogram  Personal history of chemotherapy    Restless leg syndrome    Right carotid artery occlusion 04/27/2011   S/P CABG x 3 04/09/2013   LIMA-LAD, fLRAD-RI, SVG-RCA; Echo 10/29/'15:  mild Conc LVH, no WMA, Gr 1 DD, Mod AoV Sclerosis w/ Mod AI, ~Mild-Mod MS    Stroke ([place])    2007   Thyroid disease    Past Surgical History:  Procedure Laterality Date   BREAST BIOPSY Right    CARDIAC CATHETERIZATION  2014     CATARACT EXTRACTION W/PHACO Right 06/21/2015   Procedure: CATARACT EXTRACTION PHACO AND INTRAOCULAR LENS PLACEMENT ([place]);  Surgeon: Tonny Branch, MD;  Location: [facility];  Service: Ophthalmology;  Laterality: Right;  CDE: 7.05   CATARACT EXTRACTION W/PHACO Left 07/01/2015   Procedure: CATARACT EXTRACTION PHACO AND INTRAOCULAR LENS PLACEMENT ([facility]);  Surgeon: Tonny Branch, MD;  Location: [facility];  Service: Ophthalmology;  Laterality: Left;  CDE: 7.49   COLONOSCOPY [name] 05/06/2015   Procedure: COLONOSCOPY;  Surgeon: Rogene Houston, MD;  Location: [facility];  Service: Endoscopy;  Laterality: [name];  930   CORONARY ARTERY BYPASS GRAFT [name] 04/08/2013   Procedure: CORONARY ARTERY BYPASS GRAFTING (CABG);  Surgeon: Ivin Poot, MD;  Location: [facility];  Service: Open Heart Surgery;  Laterality: [name];  Times 3 using left internal mammary artery; endoscopically harvested right saphenous vein and left radial artery   INTRAOPERATIVE TRANSESOPHAGEAL ECHOCARDIOGRAM [name] 04/08/2013   Procedure: INTRAOPERATIVE TRANSESOPHAGEAL ECHOCARDIOGRAM;  Surgeon: Ivin Poot, MD;  Location: [facility];  Service: Open Heart Surgery;  Laterality: [name];   LAPAROSCOPIC APPENDECTOMY [name] 12/23/2015   Procedure: APPENDECTOMY LAPAROSCOPIC;  Surgeon: Donnie Mesa, MD;  Location: [facility];  Service: General;  Laterality: [name];   LEFT HEART CATHETERIZATION WITH CORONARY ANGIOGRAM [name] 04/07/2013   Procedure: LEFT HEART CATHETERIZATION WITH CORONARY ANGIOGRAM;  Surgeon: Leonie Man, MD;  Location: [facility] CATH LAB;  Service: Cardiovascular;  Laterality: [name];   LYMPH NODE BIOPSY Left 1983   neck   MASTECTOMY Right    w/axillary lymph node dissection   NM TREADMILL MYOVIEW LTD  08/06/2014   Exercise 5:07 min, 6.3 METS; symptoms noted or extreme dyspnea and lightheadedness with some chest tightness. No EKG changes. No ischemia or infarction was normal EF.   RADIAL ARTERY HARVEST Left 04/08/2013   Procedure: RADIAL ARTERY HARVEST;  Surgeon: Ivin Poot, MD;  Location: [facility];  Service: Open Heart Surgery;  Laterality: Left;   TONSILLECTOMY  1974   TRANSTHORACIC ECHOCARDIOGRAM  08/06/2014    Normal EF 55-60%. Gr 1 DD - increased LVEDP. Moderate aortic valve thickening suggestive of AoV Sclerosis but not stenosis. Mild AI; MIld MS - mean gradient of 7 mmHg & HR of 122 bpm.     SOCIAL HISTORY:  Social History   Socioeconomic History   Marital status: Married    Spouse name: Not on file   Number of children: 2   Years of education: Not on file   Highest education level: Not on file  Occupational History   Not on file  Social Needs   Financial resource strain: Not on file   Food insecurity:    Worry: Not on file    Inability: Not on file   Transportation needs:    Medical: Not on file    Non-medical: Not on file  Tobacco Use   Smoking status: Former Smoker    Packs/day: 1.00    Years: 15.00    Pack years: 15.00    Types:  Cigarettes    Last attempt to quit: 06/09/1982    Years since quitting: 36.6   Smokeless tobacco: Never Used  Substance and Sexual Activity   Alcohol use: No   Drug use: No   Sexual activity: Never  Lifestyle   Physical activity:    Days per week: Not on file    Minutes per session: Not on file   Stress: Not on file  Relationships   Social connections:    Talks on phone: Not on file    Gets together: Not on file    Attends religious service: Not on file    Active member of club or organization: Not on file    Attends meetings of clubs or organizations: Not on file    Relationship status: Not on file   Intimate partner violence:    Fear of current or ex partner: Not on file    Emotionally abused: Not on file    Physically abused: Not on file    Forced sexual activity: Not on file  Other Topics Concern   Not on file  Social History Narrative   Married. Former smoker who quit in 1983.   No routine exercise - gained ~40lb post CABG.    FAMILY HISTORY:  Family  History  Problem Relation Age of Onset   Alzheimer's disease Mother    Stroke Mother    Hypertension Mother    Hyperlipidemia Mother    Diabetes Mother    Hypertension Father    Diabetes Paternal Grandmother    Diabetes Paternal Grandfather    Breast cancer Paternal Aunt    Ovarian cancer Paternal Aunt     CURRENT MEDICATIONS:  Outpatient Encounter Medications as of 01/08/2019  Medication Sig   acetaminophen (TYLENOL) 500 MG tablet Take 500 mg by mouth every 6 (six) hours as needed for mild pain.    albuterol (PROVENTIL HFA;VENTOLIN HFA) 108 (90 Base) MCG/ACT inhaler Inhale 2 puffs into the lungs every 6 (six) hours as needed for wheezing or shortness of breath.   ALPRAZolam (XANAX) 1 MG tablet Take 0.5 mg by mouth at bedtime as needed for anxiety.    aspirin EC 81 MG tablet Take 1 tablet (81 mg total) by mouth daily.   Cholecalciferol (VITAMIN D-3 PO) Take 1 tablet by mouth daily.    CINNAMON PO Take by mouth.   citalopram (CELEXA) 20 MG tablet Take 40 mg by mouth every evening.    Cyanocobalamin (VITAMIN B 12 PO) Take by mouth.   diphenhydrAMINE (BENADRYL) 25 MG tablet Take 25 mg by mouth daily as needed for allergies.   ECHINACEA EXTRACT PO Take by mouth.   Garlic 937 MG CAPS Take 500 mg by mouth daily.   Ginkgo Biloba 60 MG CAPS Take 1 capsule by mouth daily.   levothyroxine (SYNTHROID, LEVOTHROID) 75 MCG tablet Take 75 mcg by mouth daily.    Magnesium 250 MG TABS Take 1 tablet by mouth daily.   nitroGLYCERIN (NITROSTAT) 0.4 MG SL tablet Place 1 tablet (0.4 mg total) under the tongue every 5 (five) minutes as needed for chest pain.   OVER THE COUNTER MEDICATION Take 1 tablet by mouth daily. Ultra kidney complex   Red Yeast Rice 600 MG CAPS Take 600 mg by mouth daily. Per pt   rOPINIRole (REQUIP) 4 MG tablet Take 4 mg by mouth 2 (two) times daily. Per pt   simvastatin (ZOCOR) 40 MG tablet Take 40 mg by mouth once a week. Per pt takes weekly on  Wednesday's in the morning   [DISCONTINUED] rOPINIRole (REQUIP) 2 MG tablet Take 2 mg by mouth at bedtime.    No facility-administered encounter medications on file as of 01/08/2019.     ALLERGIES:  No Known Allergies   PHYSICAL EXAM:  ECOG Performance status: 1  Vitals:   01/08/19 1142  BP: (!) 151/65  Pulse: 83  Resp: 16  Temp: (!) 97 F (36.1 C)  SpO2: 97%   Filed Weights   01/08/19 1142  Weight: 238 lb 14.4 oz (108.4 kg)    Physical Exam Constitutional:      Appearance: Normal appearance. He is normal weight.  Cardiovascular:     Rate and Rhythm: Normal rate and regular rhythm.     Heart sounds: Normal heart sounds.  Pulmonary:     Effort: Pulmonary effort is normal.     Breath sounds: Normal breath sounds.  Abdominal:     General: Abdomen is flat. Bowel sounds are normal.     Palpations: Abdomen is soft.  Musculoskeletal: Normal range of motion.  Skin:    General: Skin is warm and dry.  Neurological:     Mental Status: He is alert and oriented to person, place, and time. Mental status is at baseline.  Psychiatric:        Mood and Affect: Mood normal.        Behavior: Behavior normal.        Thought Content: Thought content normal.        Judgment: Judgment normal.      LABORATORY DATA:  I have reviewed the labs as listed.  CBC    Component Value Date/Time   WBC 7.7 12/31/2018 1037   RBC 5.09 12/31/2018 1037   HGB 15.1 12/31/2018 1037   HCT 46.7 12/31/2018 1037   PLT 218 12/31/2018 1037   MCV 91.7 12/31/2018 1037   MCH 29.7 12/31/2018 1037   MCHC 32.3 12/31/2018 1037   RDW 13.5 12/31/2018 1037   LYMPHSABS 1.5 12/31/2018 1037   MONOABS 0.7 12/31/2018 1037   EOSABS 0.2 12/31/2018 1037   BASOSABS 0.1 12/31/2018 1037   CMP Latest Ref Rng & Units 12/31/2018 07/27/2017 07/28/2016  Glucose 70 - 99 mg/dL 165(H) 187(H) 190(H)  BUN 8 - 23 mg/dL 14 12 11   Creatinine 0.61 - 1.24 mg/dL 1.18 1.28(H) 1.61(H)  Sodium 135 - 145 mmol/L 138 137 139   Potassium 3.5 - 5.1 mmol/L 4.2 4.1 4.2  Chloride 98 - 111 mmol/L 105 104 107  CO2 22 - 32 mmol/L 25 25 25   Calcium 8.9 - 10.3 mg/dL 9.0 8.7(L) 8.6(L)  Total Protein 6.5 - 8.1 g/dL 6.7 6.4(L) 6.3(L)  Total Bilirubin 0.3 - 1.2 mg/dL 0.5 0.7 0.8  Alkaline Phos 38 - 126 U/L 75 58 54  AST 15 - 41 U/L 28 27 25   ALT 0 - 44 U/L 29 20 19     DIAGNOSTIC IMAGING:  I have independently reviewed the mammogram scan and discussed with the patient.   I personally performed a face-to-face visit.    ASSESSMENT & PLAN:   Invasive ductal carcinoma of right breast, stage 2- 2012 s/p chemo and mastectomy 1.  Stage II invasive ductal carcinoma the right breast: - He was initially diagnosed 12/14/2010 with invasive ductal carcinoma the right breast ER+/HER2- - Had a biopsy done on 01/17/2011. - He had a right simple mastectomy on 03/08/2011 showing a 2.1 cm invasive ductal carcinoma with clear margins, 0 out of 2 lymph nodes.  ER 96%, PR  84%, Ki-67 19%, HER-2 negative. - He completed his chemotherapy and started tamoxifen in May 2012 and completed May 2017. - Oncotype score was 14. -Last mammogram on 12/31/2018 was B RADS category 2 benign. -He had labs drawn on 12/31/2018.  LDH was 145.  Creatinine was 1.18 which is stable.  All other labs were within normal limits. - On today's exam it did not reveal any adenopathy or lumps present. - He will follow-up in 1 year with repeat mammogram and labs.  2.  Hodgkin lymphoma: - He was treated in the early 80s with mantle radiation. - He was diagnosed with a right lymph node biopsy. - He is under observation at this time      Orders placed this encounter:  Orders Placed This Encounter  Procedures   MM DIAG BREAST TOMO UNI LEFT   Lactate dehydrogenase   CBC with Differential/Platelet   Comprehensive metabolic panel   VITAMIN D 25 Hydroxy (Vit-D Deficiency, Fractures)      Francene Finders, FNP-C [place] [phone]

## 2019-02-04 ENCOUNTER — Encounter (HOSPITAL_COMMUNITY): Payer: Medicare Other

## 2019-02-07 ENCOUNTER — Telehealth: Payer: Self-pay | Admitting: Cardiology

## 2019-02-07 NOTE — Telephone Encounter (Signed)
Called both home and cell phone and both did not have a VM set up.

## 2019-02-27 DIAGNOSIS — F419 Anxiety disorder, unspecified: Secondary | ICD-10-CM | POA: Diagnosis not present

## 2019-02-27 DIAGNOSIS — E063 Autoimmune thyroiditis: Secondary | ICD-10-CM | POA: Diagnosis not present

## 2019-02-27 DIAGNOSIS — I10 Essential (primary) hypertension: Secondary | ICD-10-CM | POA: Diagnosis not present

## 2019-02-27 DIAGNOSIS — Z1389 Encounter for screening for other disorder: Secondary | ICD-10-CM | POA: Diagnosis not present

## 2019-02-27 DIAGNOSIS — Z6830 Body mass index (BMI) 30.0-30.9, adult: Secondary | ICD-10-CM | POA: Diagnosis not present

## 2019-03-10 ENCOUNTER — Other Ambulatory Visit (HOSPITAL_COMMUNITY)
Admission: RE | Admit: 2019-03-10 | Discharge: 2019-03-10 | Disposition: A | Discharge status: Home / Self Care | Payer: Medicare Other | Source: Ambulatory Visit | Attending: Pulmonary Disease | Admitting: Pulmonary Disease

## 2019-03-10 ENCOUNTER — Other Ambulatory Visit: Payer: Self-pay

## 2019-03-10 DIAGNOSIS — G2581 Restless legs syndrome: Secondary | ICD-10-CM | POA: Insufficient documentation

## 2019-03-10 DIAGNOSIS — G4733 Obstructive sleep apnea (adult) (pediatric): Secondary | ICD-10-CM | POA: Diagnosis not present

## 2019-03-10 DIAGNOSIS — I2510 Atherosclerotic heart disease of native coronary artery without angina pectoris: Secondary | ICD-10-CM | POA: Diagnosis not present

## 2019-03-10 DIAGNOSIS — J700 Acute pulmonary manifestations due to radiation: Secondary | ICD-10-CM | POA: Diagnosis not present

## 2019-03-10 HISTORY — PX: TRANSTHORACIC ECHOCARDIOGRAM: SHX275

## 2019-03-10 LAB — IRON AND TIBC
Iron: 72 ug/dL (ref 45–182)
Saturation Ratios: 21 % (ref 17.9–39.5)
TIBC: 349 ug/dL (ref 250–450)
UIBC: 277 ug/dL

## 2019-03-13 ENCOUNTER — Other Ambulatory Visit: Payer: Self-pay

## 2019-03-13 ENCOUNTER — Ambulatory Visit (HOSPITAL_COMMUNITY)
Admission: RE | Admit: 2019-03-13 | Discharge: 2019-03-13 | Disposition: A | Discharge status: Home / Self Care | Payer: Medicare Other | Source: Ambulatory Visit | Attending: Cardiology | Admitting: Cardiology

## 2019-03-13 DIAGNOSIS — I351 Nonrheumatic aortic (valve) insufficiency: Secondary | ICD-10-CM | POA: Insufficient documentation

## 2019-03-13 MED ORDER — PERFLUTREN LIPID MICROSPHERE
1.0000 mL | INTRAVENOUS | Status: AC | PRN
  Administered 2019-03-13: 2 mL via INTRAVENOUS
  Administered 2019-03-13: 1 mL via INTRAVENOUS

## 2019-03-13 NOTE — Progress Notes (Signed)
*  PRELIMINARY RESULTS* Echocardiogram 2D Echocardiogram has been performed with [facility].  Ryan [name] 03/13/2019, 2:29 PM

## 2019-05-09 ENCOUNTER — Other Ambulatory Visit: Payer: Self-pay

## 2019-06-18 DIAGNOSIS — Z23 Encounter for immunization: Secondary | ICD-10-CM | POA: Diagnosis not present

## 2019-07-28 ENCOUNTER — Other Ambulatory Visit: Payer: Self-pay

## 2019-07-28 DIAGNOSIS — I6523 Occlusion and stenosis of bilateral carotid arteries: Secondary | ICD-10-CM

## 2019-07-30 ENCOUNTER — Other Ambulatory Visit: Payer: Self-pay

## 2019-07-30 ENCOUNTER — Ambulatory Visit (HOSPITAL_COMMUNITY)
Admission: RE | Admit: 2019-07-30 | Discharge: 2019-07-30 | Disposition: A | Discharge status: Home / Self Care | Payer: Medicare Other | Source: Ambulatory Visit | Attending: Family | Admitting: Family

## 2019-07-30 ENCOUNTER — Encounter: Payer: Self-pay | Admitting: Family

## 2019-07-30 ENCOUNTER — Ambulatory Visit (INDEPENDENT_AMBULATORY_CARE_PROVIDER_SITE_OTHER): Payer: Medicare Other | Admitting: Family

## 2019-07-30 VITALS — BP 156/79 | HR 77 | Temp 97.3°F | Resp 16 | Ht 75.0 in | Wt 236.0 lb

## 2019-07-30 DIAGNOSIS — I6523 Occlusion and stenosis of bilateral carotid arteries: Secondary | ICD-10-CM

## 2019-07-30 DIAGNOSIS — I6521 Occlusion and stenosis of right carotid artery: Secondary | ICD-10-CM | POA: Diagnosis not present

## 2019-07-30 DIAGNOSIS — I6522 Occlusion and stenosis of left carotid artery: Secondary | ICD-10-CM

## 2019-07-30 NOTE — Patient Instructions (Signed)

## 2019-07-30 NOTE — Progress Notes (Signed)
Chief Complaint: Follow up Extracranial Carotid Artery Stenosis   History of Present Illness  JAQUI OFTEDAHL is a 68 y.o. male whom Dr. Scot Dock has been monitoring. Pt has a known right internal carotid artery occlusion. He comes in for a follow up carotid duplex scan as we are following the left internal carotid artery closely.  His original right hemispheric stroke was in 2007 and was associated with left-sided weakness, no further stroke or TIA activity since then. He had radiation therapy in 1983 to his chest and neck for Hodgkin's Disease. He was diagnosed with right breast cancer in 2012, treated with surgical excision including lymph nodes.  He has undergone coronary revascularization, left radial artery was used as one of the bypass grafts..  Patient has not had previous carotid artery intervention.  He has scarring of his lungs from radiation tx for his Hodgkin's Disease, and has dyspnea associated with this. He denies chest pain, denies fever or chills.   Pt states that Dr. 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   Diabetic: borderline, pt and wife do not know his last A1C, not on file Tobacco use: former smoker, quit in 1983  Pt meds include: Statin : Yes ASA: Yes Other anticoagulants/antiplatelets: coumadin was managed by the cancer center at [facility], when the tamoxifen was stopped, the warfarin was stopped. (He had breast cancer and Hodgkin's Disease)   Past Medical History:  Diagnosis Date  . Adie's pupil   . Anemia 07/14/2013  . Borderline diabetes   . CAD (coronary artery disease), native coronary artery 04/09/2013   Dist LM stenosis 70-80% (at trifurcation into LAD, CX, & 2 Ramus branches, 70% mid RCA);; b) Myoview 08/06/14: Low Risk, no Ischemia/infarction  . Depression 1983   "after I was dx'd w/CA; now just comes in spells"  (12/22/2015)  . Essential hypertension   .  Exertional shortness of breath   . Hodgkin's lymphoma ([place]) 1983  . Hypercholesterolemia   . Hypothyroidism   . Invasive ductal carcinoma of breast, stage 2 ([place]) 04/27/2011   chemo, mastectomy  . Left carotid artery partial occlusion 04/27/2011   CAROTIC DOPPLER 11/2013: < 40% R ICA stenosis, distal waveforms damped - suggests probably intracranial occlusoin, < [id number] LICA, normal vertebrals -- no change  . Mild mitral stenosis by prior echocardiogram 08/06/2014    Noted on echocardiogram  . Personal history of chemotherapy   . Restless leg syndrome   . Right carotid artery occlusion 04/27/2011  . S/P CABG x 3 04/09/2013   LIMA-LAD, fLRAD-RI, SVG-RCA; Echo 10/29/'15:  mild Conc LVH, no WMA, Gr 1 DD, Mod AoV Sclerosis w/ Mod AI, ~Mild-Mod MS   . Stroke [facility])    2007  . Thyroid disease     Social History Social History   Tobacco Use  . Smoking status: Former Smoker    Packs/day: 1.00    Years: 15.00    Pack years: 15.00    Types: Cigarettes    Quit date: 06/09/1982    Years since quitting: 37.1  . Smokeless tobacco: Never Used  Substance Use Topics  . Alcohol use: No  . Drug use: No    Family History Family History  Problem Relation Age of Onset  . Alzheimer's disease Mother   . Stroke Mother   . Hypertension Mother   . Hyperlipidemia Mother   . Diabetes Mother   . Hypertension Father   . Diabetes Paternal  Grandmother   . Diabetes Paternal Grandfather   . Breast cancer Paternal Aunt   . Ovarian cancer Paternal Aunt     Surgical History Past Surgical History:  Procedure Laterality Date  . BREAST BIOPSY Right   . CARDIAC CATHETERIZATION  2014  . CATARACT EXTRACTION W/PHACO Right 06/21/2015   Procedure: CATARACT EXTRACTION PHACO AND INTRAOCULAR LENS PLACEMENT ([place]);  Surgeon: Tonny Branch, MD;  Location: [facility];  Service: Ophthalmology;  Laterality: Right;  CDE: 7.05  . CATARACT EXTRACTION W/PHACO Left 07/01/2015   Procedure: CATARACT EXTRACTION PHACO AND INTRAOCULAR LENS  PLACEMENT ([facility]);  Surgeon: Tonny Branch, MD;  Location: [facility];  Service: Ophthalmology;  Laterality: Left;  CDE: 7.49  . COLONOSCOPY [name] 05/06/2015   Procedure: COLONOSCOPY;  Surgeon: Rogene Houston, MD;  [facility]: [facility];  Service: Endoscopy;  Laterality: [name];  930  . CORONARY ARTERY BYPASS GRAFT [name] 04/08/2013   Procedure: CORONARY ARTERY BYPASS GRAFTING (CABG);  Surgeon: Ivin Poot, MD;  Location: [place];  Service: Open Heart Surgery;  Laterality: [name];  Times 3 using left internal mammary artery; endoscopically harvested right saphenous vein and left radial artery  . INTRAOPERATIVE TRANSESOPHAGEAL ECHOCARDIOGRAM [name] 04/08/2013   Procedure: INTRAOPERATIVE TRANSESOPHAGEAL ECHOCARDIOGRAM;  Surgeon: Ivin Poot, MD;  Location: [place];  Service: Open Heart Surgery;  Laterality: [name];  . LAPAROSCOPIC APPENDECTOMY [name] 12/23/2015   Procedure: APPENDECTOMY LAPAROSCOPIC;  Surgeon: Donnie Mesa, MD;  Location: [place];  Service: General;  Laterality: [name];  . LEFT HEART CATHETERIZATION WITH CORONARY ANGIOGRAM [name] 04/07/2013   Procedure: LEFT HEART CATHETERIZATION WITH CORONARY ANGIOGRAM;  Surgeon: Leonie Man, MD;  Location: [facility] CATH LAB;  Service: Cardiovascular;  Laterality: [name];  . LYMPH NODE BIOPSY Left 1983   neck  . MASTECTOMY Right    w/axillary lymph node dissection  . NM TREADMILL MYOVIEW LTD  08/06/2014   Exercise 5:07 min, 6.3 METS; symptoms noted or extreme dyspnea and lightheadedness with some chest tightness. No EKG changes. No ischemia or infarction was normal EF.  Marland Kitchen RADIAL ARTERY HARVEST Left 04/08/2013   Procedure: RADIAL ARTERY HARVEST;  Surgeon: Ivin Poot, MD;  Location: [place];  Service: Open Heart Surgery;  Laterality: Left;  . TONSILLECTOMY  1974  . TRANSTHORACIC ECHOCARDIOGRAM  08/06/2014    Normal EF 55-60%. Gr 1 DD - increased LVEDP. Moderate aortic valve thickening suggestive of AoV Sclerosis but not stenosis. Mild AI; MIld MS - mean gradient of 7 mmHg & HR of 122  bpm.    No Known Allergies  Current Outpatient Medications  Medication Sig Dispense Refill  . acetaminophen (TYLENOL) 500 MG tablet Take 500 mg by mouth every 6 (six) hours as needed for mild pain.     Marland Kitchen albuterol (PROVENTIL HFA;VENTOLIN HFA) 108 (90 Base) MCG/ACT inhaler Inhale 2 puffs into the lungs every 6 (six) hours as needed for wheezing or shortness of breath.    . ALPRAZolam (XANAX) 1 MG tablet Take 0.5 mg by mouth at bedtime as needed for anxiety.     Jearl Klinefelter ELLIPTA 62.5-25 MCG/INH AEPB     . aspirin EC 81 MG tablet Take 1 tablet (81 mg total) by mouth daily. 90 tablet 3  . Cholecalciferol (VITAMIN D-3 PO) Take 1 tablet by mouth daily.     Marland Kitchen CINNAMON PO Take by mouth.    . citalopram (CELEXA) 20 MG tablet Take 40 mg by mouth every evening.     . Cyanocobalamin (VITAMIN B 12 PO) Take by mouth.    Marland Kitchen  diphenhydrAMINE (BENADRYL) 25 MG tablet Take 25 mg by mouth daily as needed for allergies.    Marland Kitchen ECHINACEA EXTRACT PO Take by mouth.    . Garlic [id number] MG CAPS Take 500 mg by mouth daily.    . Ginkgo Biloba 60 MG CAPS Take 1 capsule by mouth daily.    Marland Kitchen levothyroxine (SYNTHROID, LEVOTHROID) 75 MCG tablet Take 75 mcg by mouth daily.     . Magnesium 250 MG TABS Take 1 tablet by mouth daily.    . nitroGLYCERIN (NITROSTAT) 0.4 MG SL tablet Place 1 tablet (0.4 mg total) under the tongue every 5 (five) minutes as needed for chest pain. 25 tablet 3  . OVER THE COUNTER MEDICATION Take 1 tablet by mouth daily. Ultra kidney complex    . Red Yeast Rice 600 MG CAPS Take 600 mg by mouth daily. Per pt    . rOPINIRole (REQUIP) 4 MG tablet Take 4 mg by mouth 2 (two) times daily. Per pt    . simvastatin (ZOCOR) 40 MG tablet Take 40 mg by mouth once a week. Per pt takes weekly on Wednesday's in the morning     No current facility-administered medications for this visit.     Review of Systems : See HPI for pertinent positives and negatives.  Physical Examination  Vitals:   07/30/19 1140 07/30/19 1143   BP: (!) 143/69 (!) 156/79  Pulse: 77 77  Resp: 16   Temp: (!) 97.3 F (36.3 C)   TempSrc: Temporal   SpO2: 97%   Weight: 236 lb (107 kg)   Height: 6\' 3"  (1.905 m)    Body mass index is 29.5 kg/m.  General: WDWN male in NAD GAIT: normal Eyes: Right pupil is smaller than left, both are round HENT: No gross abnormalities.  Pulmonary:  Respirations are non-labored, limited air movement in all fields, few faint wheezes, no rales or rhonchi Cardiac: regular rhythm, + murmur  VASCULAR EXAM Carotid Bruits Right Left   Negative Positive     Abdominal aortic pulse is not palpable. Radial pulses: right is 2+ palpable, left is 1+ palpable                                                                                                                           LE Pulses Right Left       POPLITEAL  not palpable   not palpable       POSTERIOR TIBIAL  2+ palpable   2+ palpable        DORSALIS PEDIS      ANTERIOR TIBIAL not palpable  1+ palpable     Gastrointestinal: soft, nontender, BS WNL, no r/g, no palpable masses. Musculoskeletal: no muscle atrophy/wasting. M/S 5/5 throughout, extremities without ischemic changes Skin: No rashes, no ulcers, no cellulitis.   Neurologic:  A&O X 3; appropriate affect, sensation is normal; speech is normal, CN 2-12 intact, pain and light touch intact in extremities, motor exam as listed above.  Psychiatric: Normal thought content, mood appropriate to clinical situation.    DATA Carotid Duplex (07-30-19): Right Carotid: High-resistant flow with dampening at the distal ICA consistent                with distal occlusion or obstruction.                Near occlusion of the proximal ICA with trickle flow noted.                Non-hemodynamically significant plaque <50% noted in the CCA.                The ECA appears <50% stenosed. Left Carotid: Velocities in the left ICA are consistent with a 40-59% stenosis.               Non-hemodynamically  significant plaque <50% noted in the CCA. Vertebrals:  Bilateral vertebral arteries demonstrate antegrade flow. Elevated              velocities of the left vertebral artery with distal turbulence              noted. Subclavians: Left subclavian artery was stenotic. Normal flow hemodynamics were seen in the right subclavian artery Compared to the exam on 01-16-18, right ICA remains occluded, increased stenosis in the left ICA (was 1-39% stenosed).     Assessment: AUDY DEERWESTER is a 68 y.o. male who has a known right extracranial internal carotid artery occlusion. He comes in for a follow up carotid duplex scan as we are following the left ICA closely.  His original right hemispheric stroke was in 2007 and was associated with left-sided weakness, no further stroke or TIA activity since then. Today's carotid duplex confirms right ICA occlusion and suggests 40-59% stenosis; increased stenosis in the left ICA compared to the exam on 01-16-18.  Plan: Follow-up in 9 months with Carotid Duplex scan.   I discussed in depth with the patient the nature of atherosclerosis, and emphasized the importance of maximal medical management including strict control of blood pressure, blood glucose, and lipid levels, obtaining regular exercise, and continued cessation of smoking.  The patient is aware that without maximal medical management the underlying atherosclerotic disease process will progress, limiting the benefit of any interventions. The patient was given information about stroke prevention and what symptoms should prompt the patient to seek immediate medical care. Thank you for allowing [place] to participate in this patient's care.  Clemon Chambers, RN, MSN, FNP-C [facility] Office: [phone]  Clinic Physician: Laqueta Due  07/30/19 12:08 PM

## 2019-08-10 HISTORY — PX: NM MYOVIEW LTD: HXRAD82

## 2019-08-21 ENCOUNTER — Other Ambulatory Visit: Payer: Self-pay

## 2019-08-21 ENCOUNTER — Ambulatory Visit (INDEPENDENT_AMBULATORY_CARE_PROVIDER_SITE_OTHER): Payer: Medicare Other | Admitting: Cardiology

## 2019-08-21 ENCOUNTER — Encounter: Payer: Self-pay | Admitting: Cardiology

## 2019-08-21 VITALS — BP 160/81 | HR 87 | Ht 75.0 in | Wt 240.6 lb

## 2019-08-21 DIAGNOSIS — R0600 Dyspnea, unspecified: Secondary | ICD-10-CM

## 2019-08-21 DIAGNOSIS — I350 Nonrheumatic aortic (valve) stenosis: Secondary | ICD-10-CM

## 2019-08-21 DIAGNOSIS — R5382 Chronic fatigue, unspecified: Secondary | ICD-10-CM

## 2019-08-21 DIAGNOSIS — Z951 Presence of aortocoronary bypass graft: Secondary | ICD-10-CM

## 2019-08-21 DIAGNOSIS — I951 Orthostatic hypotension: Secondary | ICD-10-CM

## 2019-08-21 DIAGNOSIS — I6523 Occlusion and stenosis of bilateral carotid arteries: Secondary | ICD-10-CM

## 2019-08-21 DIAGNOSIS — I2510 Atherosclerotic heart disease of native coronary artery without angina pectoris: Secondary | ICD-10-CM | POA: Diagnosis not present

## 2019-08-21 DIAGNOSIS — R0609 Other forms of dyspnea: Secondary | ICD-10-CM

## 2019-08-21 DIAGNOSIS — E669 Obesity, unspecified: Secondary | ICD-10-CM | POA: Diagnosis not present

## 2019-08-21 DIAGNOSIS — E785 Hyperlipidemia, unspecified: Secondary | ICD-10-CM

## 2019-08-21 DIAGNOSIS — I10 Essential (primary) hypertension: Secondary | ICD-10-CM | POA: Diagnosis not present

## 2019-08-21 DIAGNOSIS — I342 Nonrheumatic mitral (valve) stenosis: Secondary | ICD-10-CM

## 2019-08-21 DIAGNOSIS — I050 Rheumatic mitral stenosis: Secondary | ICD-10-CM | POA: Insufficient documentation

## 2019-08-21 MED ORDER — ROSUVASTATIN CALCIUM 40 MG PO TABS: 40.0000 mg | ORAL_TABLET | Freq: Every day | ORAL | 3 refills | Status: DC

## 2019-08-21 NOTE — Progress Notes (Signed)
Primary Care Provider: Redmond School, MD Cardiologist: Glenetta Hew, MD Electrophysiologist:   Clinic Note: Chief Complaint  Patient presents with  . Follow-up    Delayed annual follow-up secondary to COVID-19.  Was due to be seen April-May  . Shortness of Breath    Progressive exertional dyspnea  . Coronary Artery Disease    History of CABG  . Cardiac Valve Problem    Follow-up echo with mild AS & MS    HPI:    Ryan [name] is a 68 y.o. male with a PMH below who presents today for delayed annual follow-up, to discuss results of echocardiogram and exertional dyspnea..   (CABG 3: LIMnA-LAD, SVG-RCA, Lrad-Ramus) in July 2014 for Severe LM -   Carotid occlusion -occluded right carotid with stenotic left carotid--followed by Dr. Scot Dock.  OSA- on CPAP.  H/o NHL (complicated by radiation pneumonitis)  Ryan [name] was last seen in April 2019 -> noted feeling better since being started on CPAP.  Sleeping better.  This also improved his exertional dyspnea.  Was losing weight.  No chest tightness or pressure.  Otherwise doing well.  Recent Hospitalizations: None  Reviewed  CV studies:    The following studies were reviewed today: (if available, images/films reviewed: From Epic Chart or Care Everywhere) . Carotid Dopplers . Echocardiogram May 2019:  o Initial report: EF 55-60%, GRII DD.  High filling pressures.  Cannot exclude bicuspid morphology of aortic valve with severely calcified leaflets.  Mild aortic root dilation.  Severe MAC, mild stenosis (valve area by pressure half-time and continuity creation roughly 2 cm).  Mod RV dilation with mild to mod reduced EF.-Evidence suggest Definity contrast in order to determine wall motion. o Limited echo with Definity: EF 55 to 65%.  GRII DD.  Mild aortic stenosis.  Moderate mitral stenosis.  (Same valve areas ~2 cm, mean gradient 8 mmHg)) . Echocardiogram June 2020: EF 60-65%, mild concentric LVH. Gr 2 DD. No RWMA.  Mild-Mod RV dilation & enlargement. Mild LA dilation. Mod MV Calcification . Mod MS (previously read as mild, but mean gradient is lower than and estimated valve area is higher than last check).  Mild-Mod AS (Mean Gradient 13.5 mmHg)   Interval History:   Ryan [name] presents here today somewhat out of sorts.  He is concerned because the vascular team was cardiac stent in his aortic murmur and insisted that he be seen by cardiology.  Not sure if they were aware that an echocardiogram is just been checked confirming mild aortic stenosis and mitral stenosis. His biggest issue is that his energy level has significantly dropped, and he is now noticing worsening exertional dyspnea all the benefit that he noted last year has gone.  He still try to use CPAP but has not given the same benefit.  He does not sleep as well as he used to waking up several times at night, but but denies any real PND orthopnea.  No real edema.  He does not have any chest tightness or pressure, but just does not have the ability to walk nearly the amount that he used to do.  He is back to having to use his inhaler quite a bit which was similar to what it was before starting CPAP.  His orthostatic symptoms seem to have stabilized since being off blood pressure medications, with less dizziness.  But he has a little bit of unsteady gait and balance issues.  He tells me that his systolicblood pressures at home are  ranging in 100- 120 mmHg range.  CV Review of Symptoms (Summary): positive for - dyspnea on exertion and Fatigue, exercise intolerance, also leg fatigue negative for - chest pain, edema, irregular heartbeat, orthopnea, palpitations, paroxysmal nocturnal dyspnea, rapid heart rate or Syncope/ near syncope (although he does have dizziness), TIA / amaurosis fugax.  Really does not walk enough now to have claudication,  The patient does not have symptoms concerning for COVID-19 infection (fever, chills, cough, or new shortness  of breath).  The patient is practicing social distancing. ++ Masking.  He is not going out for groceries/shopping -essentially staying is separated from her body.   REVIEWED OF SYSTEMS   A comprehensive ROS was performed. Review of Systems  Constitutional: Positive for malaise/fatigue. Negative for chills, fever and weight loss.  HENT: Positive for congestion. Negative for sinus pain (If congestion gets bad).   Respiratory: Positive for cough (Persistent morning cough.  Occasionally productive.), shortness of breath (Exertional) and wheezing (Only with allergies).   Cardiovascular: Positive for leg swelling (Trivial).  Gastrointestinal: Negative for blood in stool, heartburn, melena and nausea.  Genitourinary: Negative for hematuria.  Musculoskeletal: Positive for back pain and joint pain.  Neurological: Positive for weakness (His legs in general feel weak when he walks.). Negative for dizziness (Poor balance).  Psychiatric/Behavioral: The patient has insomnia (Difficulty staying asleep.  He will go to sleep, but then will wake up at 3:30 in the morning to urinate and have a hard time falling asleep.  He then will wake up at 530 and not better go back to sleep.).   All other systems reviewed and are negative.  I have reviewed and (if needed) personally updated the patient's problem list, medications, allergies, past medical and surgical history, social and family history.   PAST MEDICAL HISTORY   Past Medical History:  Diagnosis Date  . Adie's pupil   . Anemia 07/14/2013  . Borderline diabetes   . CAD (coronary artery disease), native coronary artery 04/09/2013   Dist LM stenosis 70-80% (at trifurcation into LAD, CX, & 2 Ramus branches, 70% mid RCA);; b) Myoview 08/06/14: Low Risk, no Ischemia/infarction  . Depression 1983   "after I was dx'd w/CA; now just comes in spells"  (12/22/2015)  . Essential hypertension   . Exertional shortness of breath   . Hodgkin's lymphoma ([place]) 1983  .  Hypercholesterolemia   . Hypothyroidism   . Invasive ductal carcinoma of breast, stage 2 ([place]) 04/27/2011   chemo, mastectomy  . Left carotid artery partial occlusion 04/27/2011   CAROTIC DOPPLER 11/2013: < 40% R ICA stenosis, distal waveforms damped - suggests probably intracranial occlusoin, < 31% LICA, normal vertebrals -- no change  . Mild aortic stenosis by prior echocardiogram 02/2018   By echo June 2020: Mild-Mod AS (Mean Gradient 13.5 mmHg)  . Mild mitral stenosis by prior echocardiogram 08/06/2014   Most recent echo June 2020: Estimated valve area 2.83 cm (in 2019 was roughly 2 cm).  Mean gradient 6-8 mmHg.  Appears to be stable.  . Personal history of chemotherapy   . Restless leg syndrome   . Right carotid artery occlusion 04/27/2011  . S/P CABG x 3 04/09/2013   LIMA-LAD, fLRAD-RI, SVG-RCA; Echo 10/29/'15:  mild Conc LVH, no WMA, Gr 1 DD, Mod AoV Sclerosis w/ Mod AI, ~Mild-Mod MS   . Stroke [facility])    2007  . Thyroid disease     PAST SURGICAL HISTORY   Past Surgical History:  Procedure Laterality Date  . BREAST  BIOPSY Right   . CARDIAC CATHETERIZATION  2014  . CATARACT EXTRACTION W/PHACO Right 06/21/2015   Procedure: CATARACT EXTRACTION PHACO AND INTRAOCULAR LENS PLACEMENT ([place]);  Surgeon: Tonny Branch, MD;  Location: [facility];  Service: Ophthalmology;  Laterality: Right;  CDE: 7.05  . CATARACT EXTRACTION W/PHACO Left 07/01/2015   Procedure: CATARACT EXTRACTION PHACO AND INTRAOCULAR LENS PLACEMENT ([facility]);  Surgeon: Tonny Branch, MD;  Location: [facility];  Service: Ophthalmology;  Laterality: Left;  CDE: 7.49  . COLONOSCOPY [name] 05/06/2015   Procedure: COLONOSCOPY;  Surgeon: Rogene Houston, MD;  Location[facility];  Service: Endoscopy;  Laterality: [name];  930  . CORONARY ARTERY BYPASS GRAFT [name] 04/08/2013   Procedure: CORONARY ARTERY BYPASS GRAFTING (CABG);  Surgeon: Ivin Poot, MD;  Location: [place];  Service: Open Heart Surgery;  Laterality: [name];  Times 3 using left internal mammary  artery; endoscopically harvested right saphenous vein and left radial artery  . INTRAOPERATIVE TRANSESOPHAGEAL ECHOCARDIOGRAM [name] 04/08/2013   Procedure: INTRAOPERATIVE TRANSESOPHAGEAL ECHOCARDIOGRAM;  Surgeon: Ivin Poot, MD;  Location: [place];  Service: Open Heart Surgery;  Laterality: [name];  . LAPAROSCOPIC APPENDECTOMY [name] 12/23/2015   Procedure: APPENDECTOMY LAPAROSCOPIC;  Surgeon: Donnie Mesa, MD;  Location: [place];  Service: General;  Laterality: [name];  . LEFT HEART CATHETERIZATION WITH CORONARY ANGIOGRAM [name] 04/07/2013   Procedure: LEFT HEART CATHETERIZATION WITH CORONARY ANGIOGRAM;  Surgeon: Leonie Man, MD;  Location: [facility] C[facility] LAB;  Service: Cardiovascular;  Laterality: [name];  . LYMPH NODE BIOPSY Left 1983   neck  . MASTECTOMY Right    w/axillary lymph node dissection  . NM TREADMILL MYOVIEW LTD  08/06/2014   Exercise 5:07 min, 6.3 METS; symptoms noted or extreme dyspnea and lightheadedness with some chest tightness. No EKG changes. No ischemia or infarction was normal EF.  Marland Kitchen RADIAL ARTERY HARVEST Left 04/08/2013   Procedure: RADIAL ARTERY HARVEST;  Surgeon: Ivin Poot, MD;  Location: [place];  Service: Open Heart Surgery;  Laterality: Left;  . TONSILLECTOMY  1974  . TRANSTHORACIC ECHOCARDIOGRAM  08/06/2014    Normal EF 55-60%. Gr 1 DD - increased LVEDP. Moderate aortic valve thickening suggestive of AoV Sclerosis but not stenosis. Mild AI; MIld MS - mean gradient of 7 mmHg & HR of 122 bpm.  . TRANSTHORACIC ECHOCARDIOGRAM  02/2018   Initial report: EF 55-60%, GRII DD.  High filling pressures.  Cannot exclude bicuspid morphology of aortic valve with severely calcified leaflets.  Mild aortic root dilation.  Severe MAC, mild stenosis (valve area by P1/2 T & continuity equation ~2 cm).  Mod RV dilation with mild to mod reduced EF.---> Limited echo with Definity - > no RWMA. Mild AS, Mod MS (similat MVA, mean gradient 8 mmHg)  . TRANSTHORACIC ECHOCARDIOGRAM  03/2019    EF 60-65%, mild  concentric LVH. Gr 2 DD. No RWMA. Mild-Mod RV dilation & enlargement. Mild LA dilation. Mod MV Calcification . Mod MS (previously read as mild, but mean gradient is lower than and estimated valve area is higher than last check).  Mild-Mod AS (Mean Gradient 13.5 mmHg)     MEDICATIONS/ALLERGIES   Current Meds  Medication Sig  . acetaminophen (TYLENOL) 500 MG tablet Take 500 mg by mouth every 6 (six) hours as needed for mild pain.   Marland Kitchen albuterol (PROVENTIL HFA;VENTOLIN HFA) 108 (90 Base) MCG/ACT inhaler Inhale 2 puffs into the lungs every 6 (six) hours as needed for wheezing or shortness of breath.  . ALPRAZolam (XANAX) 1 MG tablet Take  0.5 mg by mouth at bedtime as needed for anxiety.   Jearl Klinefelter ELLIPTA 62.5-25 MCG/INH AEPB   . aspirin EC 81 MG tablet Take 1 tablet (81 mg total) by mouth daily.  . Cholecalciferol (VITAMIN D-3 PO) Take 1 tablet by mouth daily.   Marland Kitchen CINNAMON PO Take by mouth.  . citalopram (CELEXA) 20 MG tablet Take 40 mg by mouth every evening.   . Cyanocobalamin (VITAMIN B 12 PO) Take by mouth.  . diphenhydrAMINE (BENADRYL) 25 MG tablet Take 25 mg by mouth daily as needed for allergies.  Marland Kitchen ECHINACEA EXTRACT PO Take by mouth.  . Garlic 297 MG CAPS Take 500 mg by mouth daily.  . Ginkgo Biloba 60 MG CAPS Take 1 capsule by mouth daily.  Marland Kitchen levothyroxine (SYNTHROID, LEVOTHROID) 75 MCG tablet Take 75 mcg by mouth daily.   . Magnesium 250 MG TABS Take 1 tablet by mouth daily.  . nitroGLYCERIN (NITROSTAT) 0.4 MG SL tablet Place 1 tablet (0.4 mg total) under the tongue every 5 (five) minutes as needed for chest pain.  Marland Kitchen OVER THE COUNTER MEDICATION Take 1 tablet by mouth daily. Ultra kidney complex  . Red Yeast Rice 600 MG CAPS Take 600 mg by mouth daily. Per pt  . rOPINIRole (REQUIP) 4 MG tablet Take 4 mg by mouth 2 (two) times daily. Per pt  . [DISCONTINUED] simvastatin (ZOCOR) 40 MG tablet Take 40 mg by mouth once a week. Per pt takes weekly on Wednesday's in the morning   No Known  Allergies   SOCIAL HISTORY/FAMILY HISTORY   Social History   Tobacco Use  . Smoking status: Former Smoker    Packs/day: 1.00    Years: 15.00    Pack years: 15.00    Types: Cigarettes    Quit date: 06/09/1982    Years since quitting: 37.2  . Smokeless tobacco: Never Used  Substance Use Topics  . Alcohol use: No  . Drug use: No   Social History   Social History Narrative   Married. Former smoker who quit in 1983.   No routine exercise - gained ~40lb post CABG.    Family History family history includes Alzheimer's disease in his mother; Breast cancer in his paternal aunt; Diabetes in his mother, paternal grandfather, and paternal grandmother; Hyperlipidemia in his mother; Hypertension in his father and mother; Ovarian cancer in his paternal aunt; Stroke in his mother.   OBJCTIVE -PE, EKG, labs   Wt Readings from Last 3 Encounters:  08/21/19 240 lb 9.6 oz (109.1 kg)  07/30/19 236 lb (107 kg)  01/08/19 238 lb 14.4 oz (108.4 kg)  232 pounds as of April 2019  Physical Exam: BP (!) 160/81   Pulse 87   Ht _0  (1.905 m)   Wt 240 lb 9.6 oz (109.1 kg)   BMI 30.07 kg/m  Physical Exam  Constitutional: He is oriented to person, place, and time. He appears well-developed and well-nourished. No distress.  Mildly obese.  Well-groomed.  HENT:  Head: Normocephalic and atraumatic.  Neck: Normal range of motion. Neck supple. Decreased carotid pulses (Absent right carotid pulse) present. No hepatojugular reflux and no JVD present. Carotid bruit is present (Left carotid bruit, barely palpable right carotid pulse.  Also radiated aortic murmur.).  Cardiovascular: Normal rate, regular rhythm, S1 normal and S2 normal.  Occasional extrasystoles are present. PMI is not displaced. Exam reveals decreased pulses (Mildly decreased pedal pulses). Exam reveals no gallop and no friction rub.  Murmur heard. High-pitched harsh crescendo-decrescendo  midsystolic murmur is present with a grade of 3/6 at  the upper right sternal border radiating to the neck.  Low-pitched rumbling crescendo presystolic murmur is present with a grade of 1/6 at the apex. Pulmonary/Chest: Effort normal. No respiratory distress. He has no wheezes. He has no rales.  Diffuse mild interstitial sounds but no obvious rales or rhonchi.  Abdominal: Soft. Bowel sounds are normal. He exhibits no distension. There is no abdominal tenderness. There is no rebound.  Obese.  No HSM  Musculoskeletal: Normal range of motion.        General: Edema (Trivial ankle) present.  Neurological: He is alert and oriented to person, place, and time.  Psychiatric: His behavior is normal. Judgment and thought content normal.  Somewhat slow, deliberate speech.  Not sure if this is a speech issue versus cognition. Flat/blunted affect with mildly depressed mood  Vitals reviewed.   AS murmur Neck: Decreased carotid pulses (On the right side) present. No hepatojugular reflux and no JVD present. Carotid bruit is present (I do not really hear a right carotid bruit.  There is a soft bruit on the left).  Cardiovascular: Normal rate and regular rhythm.  No extrasystoles are present. PMI is not displaced. Exam reveals no gallop, no friction rub and no decreased pulses.  Murmur (Soft SEM at RUSB.) heard. Soft split S2.  Pulmonary/Chest: Effort normal and breath sounds normal. No respiratory distress.  Mild interstitial sounds, but no rales or rhonchi.    Adult ECG Report  Rate: 87 ;  Rhythm: normal sinus rhythm and Bifascicular block RBBB, LAFB (-54) - mild progression from LAD to LAFB.  Borderline LVH.;   Narrative Interpretation: Progression to LAFB from LAD otherwise stable   Recent Labs: Ordered and checked today. Lab Results  Component Value Date   CHOL 211 (H) 08/22/2019   HDL 53 08/22/2019   LDLCALC 144 (H) 08/22/2019   TRIG 80 08/22/2019   CHOLHDL 4.0 08/22/2019   Lab Results  Component Value Date   CREATININE 1.23 08/22/2019   BUN  19 08/22/2019   NA 144 08/22/2019   K 4.9 08/22/2019   CL 106 08/22/2019   CO2 24 08/22/2019    ASSESSMENT/PLAN    Problem List Items Addressed This Visit    S/P CABG x 3  LIMA-LAD, free LRA to RI, SVG-RCA  - 04/09/13 (Chronic)    Last Myoview was in 2015.  He is due for a follow-up Myoview for surveillance screening, especially with him now having worsening exertional dyspnea and fatigue.  Plan: Lexiscan Myoview      Relevant Orders   MYOCARDIAL PERFUSION IMAGING   Coronary atherosclerosis of native coronary artery - multivessel CAD, status post CABG x3 (LIMA-LAD, Left Radial-RI, SVG-RCA) - Primary (Chronic)    Now 6 years out from his CABG. his last Myoview in 2017 was nonischemic.Marland Kitchen  Now he is having progressively worsening exertional dyspnea and fatigue.  Need to exclude an ischemic etiology.  He is due for stress testing.  Plan: Check Lexiscan Myoview.  Incrementally increase lipid management by converting to Crestor and consider PC ACE inhibitor as noted-lipids not at goal.  Intolerant to beta-blockers due to fatigue and orthostatic hypotension.  Also intolerant of ACE inhibitor's/ARB, therefore not on optimal medications.  Is on aspirin that was reduced to 81 mg.      Relevant Medications   rosuvastatin (CRESTOR) 40 MG tablet   Other Relevant Orders   EKG 12/Charge capture (Completed)   Exertional dyspnea (Chronic)  Is probably multifactorial, obesity, deconditioning, but no longer on beta-blocker and therefore unlikely to have chronotropic incompetence.   He seems to be more concerned with the level of dyspnea that he was in the past.  Echocardiogram looks relatively normal and I do not suspect that the mild to moderate mitral stenosis or mild aortic stenosis is playing a role. He does have grade 2 diastolic dysfunction although that may be overestimated because of the left atrial size. The echocardiogram does show some reduced RV function and dilation which goes  along with his radiation pneumonitis.  I suspect that his dyspnea is probably related to pulmonary issues, but may need to exclude ischemia.  Unfortunately because of orthostatic hypotension, when unable to treat with afterload reduction.   We will exclude ischemia with Myoview stress test.      Hyperlipidemia with target LDL less than 70 (Chronic)    Last year in April, we checked labs for June.  However these were done by his PCP and not reported to me.  LDL was 123 with total cholesterol 212.  We had recommended converting to rosuvastatin or atorvastatin. On further review, he was actually only taking simvastatin once weekly.  We recheck lipids today, and his LDL was 144 with cholesterol 211.  Initial thought was to switch to rosuvastatin once completing simvastatin, however the plan now will be for him to DC simvastatin, and start rosuvastatin 40 mg starting with 3 days a week and attempting to titrate up 1 day a week.  He has not done very well with statins in the past.  I do not expect him to be able to get daily.  Recheck labs in 3 months, if lipids not improved, would refer to CV[facility] clinical pharmacist run lipid clinic to discuss potential PCSK9 inhibitor.      Relevant Medications   rosuvastatin (CRESTOR) 40 MG tablet   Other Relevant Orders   Lipid panel (Completed)   Comprehensive Metabolic Panel (CMET) (Completed)   Essential hypertension (Chronic)    Hard to say truly has hypertension.  Blood pressure is high today, but he tells me at home his readings are usually in the 100-1 120/70 mmHg range.  I would not initiate or restart blood pressure medication such as ACE inhibitor beta-blocker due to significant orthostatic hypotension issues.      Relevant Medications   rosuvastatin (CRESTOR) 40 MG tablet   Aortic stenosis (Chronic)    Barely meeting criteria for mild to moderate stenosis with a mean gradient of 13 mmHg.  Based on his echo in June, we will plan to recheck an  echocardiogram next year in June, if stable, we can probably check with less frequent interval.  Vascular surgery team was concerned about his murmur, but we will just check an echocardiogram and I do not hear that dramatic change to his aortic stenosis murmur.      Relevant Medications   rosuvastatin (CRESTOR) 40 MG tablet   Mitral stenosis (Chronic)    We just followed up with echocardiogram in June of this year.  Very interesting findings that I reviewed comparing to 2015.  Mean gradients are roughly the same somewhere between 6 to 8 mmHg and valve area somewhere between 2 to 2.8 cm.  Probably in the mild to moderate range still.  Not at this stage yet to consider potentially related to dyspnea, especially since the left atrium is not significant dilated.  I would suspect the grade 2 diastolic function is also probably a little bit  overestimated because of left atrial size only mildly dilated.      Relevant Medications   rosuvastatin (CRESTOR) 40 MG tablet   Obesity (BMI 30-39.9) (Chronic)    Clearly playing a role in his dyspnea along with deconditioning.  He is gained weight from last time I saw him.  Discussed importance of dietary modification for weight as well as lipid management.      Fatigue (Chronic)    This has been an ongoing chronic issue for him.  We previously evaluated him in 2017 with a Myoview and echocardiogram that were pretty stable. He debrided dressed CPAP issues.  He is no longer on beta-blocker.      Orthostatic hypotension (Chronic)    Continues to have issues with orthostatic hypotension and therefore we are reluctant to actually treat his blood pressures with standard cardiac medications such as beta-blockers and ACE inhibitor/ARB.  He has an unsteady gait and some clear autonomic dysfunction.  Continue adequate hydration.  Would potentially consider support stockings.      Relevant Medications   rosuvastatin (CRESTOR) 40 MG tablet        COVID-19 Education: The signs and symptoms of COVID-19 were discussed with the patient and how to seek care for testing (follow up with PCP or arrange E-visit).   The importance of social distancing was discussed today.  I spent a total of 26 minutes with the patient and chart review. >  50% of the time was spent in direct patient consultation.  Additional time spent with chart review (studies, outside notes, etc): 20 --> reviewed 2 echoes, including images and compared to 2015.  Research on mitral stenosis monitoring.  Reviewed labs drawn today and adjustment to plan made. Total Time: 75mn   Current medicines are reviewed at length with the patient today.  (+/- concerns) none   Patient Instructions / Medication Changes & Studies & Tests Ordered   Patient Instructions  Medication Instructions:  STOP SIMVASTATIN WHEN CURRENT SUPPLY COMPLETE  START ROSUVASTATIN 40 MG ONCE DAILY WHEN SIMVASTATIN IS COMPLETE  *If you need a refill on your cardiac medications before your next appointment, please call your pharmacy*  Lab Work: Your physician recommends that you return for lab work PRIOR TO EATING-SAME DAY AS STRESS TEST  If you have labs (blood work) drawn today and your tests are completely normal, you will receive your results only by: .Marland KitchenMyChart Message (if you have MyChart) OR . A paper copy in the mail If you have any lab test that is abnormal or we need to change your treatment, we will call you to review the results.  Testing/Procedures: Your physician has requested that you have a lexiscan myoview. For further information please visit [email or web address] Please follow instruction sheet, as given.    Follow-Up: [facility] you and your health needs are our priority.  As part of our continuing mission to provide you with exceptional heart care, we have created designated Provider Care Teams.  These Care Teams include your primary Cardiologist (physician) and Advanced  Practice Providers (APPs -  Physician Assistants and Nurse Practitioners) who all work together to provide you with the care you need, when you need it.  Your next appointment:   AFTER TESTING COMPLETE  The format for your next appointment:   In Person  Provider:   DGlenetta Hew MD     Studies Ordered:   Orders Placed This Encounter  Procedures  . Lipid panel  . Comprehensive Metabolic Panel (CMET)  .  MYOCARDIAL PERFUSION IMAGING  . EKG 12/Charge capture     Glenetta Hew, M.D., M.S. Interventional Cardiologist   Pager # [phone] Phone # [phone] [address] [place], [place] [address]   Thank you for choosing Heartcare at [facility]!!

## 2019-08-21 NOTE — Patient Instructions (Addendum)
Medication Instructions:  STOP SIMVASTATIN WHEN CURRENT SUPPLY COMPLETE  START ROSUVASTATIN 40 MG ONCE DAILY WHEN SIMVASTATIN IS COMPLETE  *If you need a refill on your cardiac medications before your next appointment, please call your pharmacy*  Lab Work: Your physician recommends that you return for lab work PRIOR TO EATING-SAME DAY AS STRESS TEST  If you have labs (blood work) drawn today and your tests are completely normal, you will receive your results only by: Marland Kitchen MyChart Message (if you have MyChart) OR . A paper copy in the mail If you have any lab test that is abnormal or we need to change your treatment, we will call you to review the results.  Testing/Procedures: Your physician has requested that you have a lexiscan myoview. For further information please visit [email or web address]. Please follow instruction sheet, as given.    Follow-Up: At [facility], you and your health needs are our priority.  As part of our continuing mission to provide you with exceptional heart care, we have created designated Provider Care Teams.  These Care Teams include your primary Cardiologist (physician) and Advanced Practice Providers (APPs -  Physician Assistants and Nurse Practitioners) who all work together to provide you with the care you need, when you need it.  Your next appointment:   AFTER TESTING COMPLETE  The format for your next appointment:   In Person  Provider:   Glenetta Hew, MD  ADDENDUM after lab reviews: Unfortunately, your current cholesterol panel is actually worse than it was in July 2019. It looks like you are only taking simvastatin 1 day a week.   I would like to switch this to rosuvastatin 40 mg daily. This prescription was sent in. Try taking it starting 3 days a week. If we cannot tolerate any more than that, we can need to consider more aggressive ways of treating your cholesterol. We can discuss this in follow-up.   My initial plan was for you to  complete your simvastatin, but at this point with the lipids the way they are, less just go ahead and make the change now.   We can recheck cholesterol levels in about 3 months to see which direction ago. Based on that we may need to consider having you see our clinical pharmacist to discuss other options.   Glenetta Hew, MD

## 2019-08-22 DIAGNOSIS — E785 Hyperlipidemia, unspecified: Secondary | ICD-10-CM | POA: Diagnosis not present

## 2019-08-23 ENCOUNTER — Encounter: Payer: Self-pay | Admitting: Cardiology

## 2019-08-23 LAB — LIPID PANEL
Chol/HDL Ratio: 4 ratio (ref 0.0–5.0)
Cholesterol, Total: 211 mg/dL — ABNORMAL HIGH (ref 100–199)
HDL: 53 mg/dL (ref >39)
LDL Chol Calc (NIH): 144 mg/dL — ABNORMAL HIGH (ref 0–99)
Triglycerides: 80 mg/dL (ref 0–149)
VLDL Cholesterol Cal: 14 mg/dL (ref 5–40)

## 2019-08-23 LAB — COMPREHENSIVE METABOLIC PANEL
ALT: 44 IU/L (ref 0–44)
AST: 30 IU/L (ref 0–40)
Albumin/Globulin Ratio: 1.9 (ref 1.2–2.2)
Albumin: 4.4 g/dL (ref 3.8–4.8)
Alkaline Phosphatase: 97 IU/L (ref 39–117)
BUN/Creatinine Ratio: 15 (ref 10–24)
BUN: 19 mg/dL (ref 8–27)
Bilirubin Total: 0.4 mg/dL (ref 0.0–1.2)
CO2: 24 mmol/L (ref 20–29)
Calcium: 9.4 mg/dL (ref 8.6–10.2)
Chloride: 106 mmol/L (ref 96–106)
Creatinine, Ser: 1.23 mg/dL (ref 0.76–1.27)
GFR calc Af Amer: 68 mL/min/{1.73_m2} (ref >59)
GFR calc non Af Amer: 59 mL/min/{1.73_m2} — ABNORMAL LOW (ref >59)
Globulin, Total: 2.3 g/dL (ref 1.5–4.5)
Glucose: 126 mg/dL — ABNORMAL HIGH (ref 65–99)
Potassium: 4.9 mmol/L (ref 3.5–5.2)
Sodium: 144 mmol/L (ref 134–144)
Total Protein: 6.7 g/dL (ref 6.0–8.5)

## 2019-08-23 NOTE — Assessment & Plan Note (Signed)
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 his last Myoview in 2017 was nonischemic.Marland Kitchen  Now he is having progressively worsening exertional dyspnea and fatigue.  Need to exclude an ischemic etiology.  He is due for stress testing.  Plan: Check Lexiscan Myoview.  Incrementally increase lipid management by converting to Crestor and consider PC ACE inhibitor as noted-lipids not at goal.  Intolerant to beta-blockers due to fatigue and orthostatic hypotension.  Also intolerant of ACE inhibitor's/ARB, therefore not on optimal medications.  Is on aspirin that was reduced to 81 mg.

## 2019-08-23 NOTE — Assessment & Plan Note (Signed)
Last Myoview was in 2015.  He is due for a follow-up Myoview for surveillance screening, especially with him now having worsening exertional dyspnea and fatigue.  Plan: [employer]

## 2019-08-23 NOTE — Assessment & Plan Note (Signed)
Hard to say truly has hypertension.  Blood pressure is high today, but he tells me at home his readings are usually in the 100-1 120/70 mmHg range.  I would not initiate or restart blood pressure medication such as ACE inhibitor beta-blocker due to significant orthostatic hypotension issues.

## 2019-08-23 NOTE — Assessment & Plan Note (Addendum)
Is probably multifactorial, obesity, deconditioning, but no longer on beta-blocker and therefore unlikely to have chronotropic incompetence.   He seems to be more concerned with the level of dyspnea that he was in the past.  Echocardiogram looks relatively normal and I do not suspect that the mild to moderate mitral stenosis or mild aortic stenosis is playing a role. He does have grade 2 diastolic dysfunction although that may be overestimated because of the left atrial size. The echocardiogram does show some reduced RV function and dilation which goes along with his radiation pneumonitis.  I suspect that his dyspnea is probably related to pulmonary issues, but may need to exclude ischemia.  Unfortunately because of orthostatic hypotension, when unable to treat with afterload reduction.   We will exclude ischemia with Myoview stress test.

## 2019-08-23 NOTE — Assessment & Plan Note (Signed)
Barely meeting criteria for mild to moderate stenosis with a mean gradient of 13 mmHg.  Based on his echo in June, we will plan to recheck an echocardiogram next year in June, if stable, we can probably check with less frequent interval.  Vascular surgery team was concerned about his murmur, but we will just check an echocardiogram and I do not hear that dramatic change to his aortic stenosis murmur.

## 2019-08-23 NOTE — Assessment & Plan Note (Addendum)
Clearly playing a role in his dyspnea along with deconditioning.  He is gained weight from last time I saw him.  Discussed importance of dietary modification for weight as well as lipid management.

## 2019-08-23 NOTE — Assessment & Plan Note (Signed)
This has been an ongoing chronic issue for him.  We previously evaluated him in 2017 with a Myoview and echocardiogram that were pretty stable. He debrided dressed CPAP issues.  He is no longer on beta-blocker.

## 2019-08-23 NOTE — Assessment & Plan Note (Signed)
We just followed up with echocardiogram in June of this year.  Very interesting findings that I reviewed comparing to 2015.  Mean gradients are roughly the same somewhere between 6 to 8 mmHg and valve area somewhere between 2 to 2.8 cm.  Probably in the mild to moderate range still.  Not at this stage yet to consider potentially related to dyspnea, especially since the left atrium is not significant dilated.  I would suspect the grade 2 diastolic function is also probably a little bit overestimated because of left atrial size only mildly dilated.

## 2019-08-23 NOTE — Assessment & Plan Note (Signed)
Last year in April, we checked labs for June.  However these were done by his PCP and not reported to me.  LDL was 123 with total cholesterol 212.  We had recommended converting to rosuvastatin or atorvastatin. On further review, he was actually only taking simvastatin once weekly.  We recheck lipids today, and his LDL was 144 with cholesterol 211.  Initial thought was to switch to rosuvastatin once completing simvastatin, however the plan now will be for him to DC simvastatin, and start rosuvastatin 40 mg starting with 3 days a week and attempting to titrate up 1 day a week.  He has not done very well with statins in the past.  I do not expect him to be able to get daily.  Recheck labs in 3 months, if lipids not improved, would refer to [facility] clinical pharmacist run lipid clinic to discuss potential PCSK9 inhibitor.

## 2019-08-23 NOTE — Assessment & Plan Note (Signed)
Continues to have issues with orthostatic hypotension and therefore we are reluctant to actually treat his blood pressures with standard cardiac medications such as beta-blockers and ACE inhibitor/ARB.  He has an unsteady gait and some clear autonomic dysfunction.  Continue adequate hydration.  Would potentially consider support stockings.

## 2019-08-26 ENCOUNTER — Telehealth (HOSPITAL_COMMUNITY): Payer: Self-pay

## 2019-08-26 NOTE — Telephone Encounter (Signed)
Encounter complete. 

## 2019-08-27 ENCOUNTER — Other Ambulatory Visit: Payer: Self-pay

## 2019-08-27 ENCOUNTER — Ambulatory Visit (HOSPITAL_COMMUNITY)
Admission: RE | Admit: 2019-08-27 | Discharge: 2019-08-27 | Disposition: A | Discharge status: Home / Self Care | Payer: Medicare Other | Source: Ambulatory Visit | Attending: Cardiology | Admitting: Cardiology

## 2019-08-27 DIAGNOSIS — Z951 Presence of aortocoronary bypass graft: Secondary | ICD-10-CM | POA: Diagnosis not present

## 2019-08-27 LAB — MYOCARDIAL PERFUSION IMAGING
LV dias vol: 109 mL (ref 62–150)
LV sys vol: 49 mL
Peak HR: 82 {beats}/min
Rest HR: 79 {beats}/min
SDS: 1
SRS: 0
SSS: 1
TID: 1.04

## 2019-08-27 MED ORDER — REGADENOSON 0.4 MG/5ML IV SOLN
0.4000 mg | Freq: Once | INTRAVENOUS | Status: AC
  Administered 2019-08-27: 0.4 mg via INTRAVENOUS

## 2019-08-27 MED ORDER — TECHNETIUM TC 99M TETROFOSMIN IV KIT
30.9000 | PACK | Freq: Once | INTRAVENOUS | Status: AC | PRN
  Administered 2019-08-27: 30.9 via INTRAVENOUS
  Filled 2019-08-27: qty 31

## 2019-08-27 MED ORDER — TECHNETIUM TC 99M TETROFOSMIN IV KIT
10.2000 | PACK | Freq: Once | INTRAVENOUS | Status: AC | PRN
  Administered 2019-08-27: 10.2 via INTRAVENOUS
  Filled 2019-08-27: qty 11

## 2019-09-02 ENCOUNTER — Ambulatory Visit (INDEPENDENT_AMBULATORY_CARE_PROVIDER_SITE_OTHER): Payer: Medicare Other | Admitting: Cardiology

## 2019-09-02 ENCOUNTER — Encounter: Payer: Self-pay | Admitting: Cardiology

## 2019-09-02 ENCOUNTER — Other Ambulatory Visit: Payer: Self-pay

## 2019-09-02 VITALS — BP 150/78 | HR 86 | Temp 97.0°F | Ht 75.0 in | Wt 245.0 lb

## 2019-09-02 DIAGNOSIS — E669 Obesity, unspecified: Secondary | ICD-10-CM

## 2019-09-02 DIAGNOSIS — I350 Nonrheumatic aortic (valve) stenosis: Secondary | ICD-10-CM

## 2019-09-02 DIAGNOSIS — I10 Essential (primary) hypertension: Secondary | ICD-10-CM | POA: Diagnosis not present

## 2019-09-02 DIAGNOSIS — E785 Hyperlipidemia, unspecified: Secondary | ICD-10-CM

## 2019-09-02 DIAGNOSIS — I6523 Occlusion and stenosis of bilateral carotid arteries: Secondary | ICD-10-CM

## 2019-09-02 DIAGNOSIS — Z951 Presence of aortocoronary bypass graft: Secondary | ICD-10-CM

## 2019-09-02 DIAGNOSIS — R5382 Chronic fatigue, unspecified: Secondary | ICD-10-CM

## 2019-09-02 DIAGNOSIS — I951 Orthostatic hypotension: Secondary | ICD-10-CM | POA: Diagnosis not present

## 2019-09-02 DIAGNOSIS — R0600 Dyspnea, unspecified: Secondary | ICD-10-CM

## 2019-09-02 DIAGNOSIS — I2510 Atherosclerotic heart disease of native coronary artery without angina pectoris: Secondary | ICD-10-CM

## 2019-09-02 DIAGNOSIS — I342 Nonrheumatic mitral (valve) stenosis: Secondary | ICD-10-CM

## 2019-09-02 DIAGNOSIS — R0609 Other forms of dyspnea: Secondary | ICD-10-CM
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
## 2019-09-02 NOTE — Patient Instructions (Addendum)
Medication Instructions:  NO CHANGES *If you need a refill on your cardiac medications before your next appointment, please call your pharmacy*  Lab Work: LIPID CMP IN 3 MONTHS   AND  MAKE AN APPOINTMENT WITH CVRR - PHARMACIST - OFFICE WILL CALL AN MAKE YOU AN APPOINTMENT.  If you have labs (blood work) drawn today and your tests are completely normal, you will receive your results only by: Marland Kitchen MyChart Message (if you have MyChart) OR . A paper copy in the mail If you have any lab test that is abnormal or we need to change your treatment, we will call you to review the results.  Testing/Procedures: WILL BE SCHEDULE AT [place]  6 MONTHS - MAY 2021 Your physician has requested that you have an echocardiogram. Echocardiography is a painless test that uses sound waves to create images of your heart. It provides your doctor with information about the size and shape of your heart and how well your heart's chambers and valves are working. This procedure takes approximately one hour. There are no restrictions for this procedure.    Follow-Up: At [facility], you and your health needs are our priority.  As part of our continuing mission to provide you with exceptional heart care, we have created designated Provider Care Teams.  These Care Teams include your primary Cardiologist (physician) and Advanced Practice Providers (APPs -  Physician Assistants and Nurse Practitioners) who all work together to provide you with the care you need, when you need it.  Your next appointment:   6 month(s)- MAY 2021 AFTER ECHO IS COMPLETED  The format for your next appointment:   In Person  Provider:   Glenetta Hew, MD  Other Instructions

## 2019-09-03 ENCOUNTER — Encounter: Payer: Self-pay | Admitting: Cardiology

## 2019-09-03 NOTE — Assessment & Plan Note (Signed)
Due for follow-up in June 2021

## 2019-09-03 NOTE — Assessment & Plan Note (Signed)
Currently on rosuvastatin 40 mg that was just started based on most recent labs.  We had planned to do a full month statin holiday, but would prefer to go ahead and get him on the rosuvastatin.  Plan is recheck labs in 3 months to reassess.  Consider CVRR consultation afterwards.

## 2019-09-03 NOTE — Assessment & Plan Note (Signed)
Avoiding beta-blocker.  Had intended to do statin holiday, but with lipids go in the wrong direction we will start rosuvastatin.

## 2019-09-03 NOTE — Assessment & Plan Note (Signed)
Follow-up echocardiogram in roughly June timeframe.

## 2019-09-03 NOTE — Assessment & Plan Note (Signed)
Courage dietary modification and also increased exercise.  This would even be short bursts of exercises multiple times a day.

## 2019-09-03 NOTE — Assessment & Plan Note (Signed)
Still multifactorial, however coronary schema is less likely. Suspect deconditioning, obesity, lung disease and possible mild component of grade 2 diastolic function all involved.

## 2019-09-03 NOTE — Assessment & Plan Note (Signed)
6 years out post CABG.  Myoview just performed showed no ischemia. Suspect worsening dyspnea on exertion is probably related to his lung disease.  Plan:  Converted to Crestor for lipid management.  Intolerant of beta-blockers due to fatigue and orthostatic hypotension.  Intolerant of ACE inhibitor's and ARB is also because of right-sided, tension.  Allowing for permissive hypertension.  Continue aspirin  Low threshold to consider Imdur.

## 2019-09-03 NOTE — Assessment & Plan Note (Signed)
Not treating because of orthostatic hypotension.  Plan is to avoid ACE inhibitor/ARB or other vasodilators.  Also holding beta-blocker because of fatigue.

## 2019-09-03 NOTE — Assessment & Plan Note (Signed)
Because of profound orthostatic hypotension issues in the past and now fatigue, we are allowing for permissive hypertension.  Not on ACE inhibitor/ARB or beta-blocker for this reason. Has done better with permissive hypertension.  Less dizziness.

## 2019-09-03 NOTE — Assessment & Plan Note (Signed)
Has been due for Myoview.  Just checked now nonischemic. Unless symptoms warrant, would not check another one until 2024

## 2019-09-10 DIAGNOSIS — I2510 Atherosclerotic heart disease of native coronary artery without angina pectoris: Secondary | ICD-10-CM | POA: Diagnosis not present

## 2019-09-10 DIAGNOSIS — G2581 Restless legs syndrome: Secondary | ICD-10-CM | POA: Diagnosis not present

## 2019-09-10 DIAGNOSIS — J700 Acute pulmonary manifestations due to radiation: Secondary | ICD-10-CM | POA: Diagnosis not present

## 2019-09-10 DIAGNOSIS — G4733 Obstructive sleep apnea (adult) (pediatric): Secondary | ICD-10-CM | POA: Diagnosis not present

## 2019-10-29 ENCOUNTER — Ambulatory Visit: Payer: Medicare Other

## 2019-10-29 ENCOUNTER — Other Ambulatory Visit: Payer: Self-pay

## 2019-10-29 ENCOUNTER — Ambulatory Visit (INDEPENDENT_AMBULATORY_CARE_PROVIDER_SITE_OTHER): Payer: Medicare Other | Admitting: Orthopedic Surgery

## 2019-10-29 ENCOUNTER — Encounter: Payer: Self-pay | Admitting: Orthopedic Surgery

## 2019-10-29 VITALS — BP 145/69 | HR 87 | Ht 75.0 in | Wt 235.0 lb

## 2019-10-29 DIAGNOSIS — M25562 Pain in left knee: Secondary | ICD-10-CM

## 2019-10-29 DIAGNOSIS — G8929 Other chronic pain: Secondary | ICD-10-CM

## 2019-10-29 DIAGNOSIS — M23322 Other meniscus derangements, posterior horn of medial meniscus, left knee: Secondary | ICD-10-CM

## 2019-10-29 NOTE — Progress Notes (Signed)
Ryan [name]  10/29/2019  Body mass index is 29.37 kg/m.   HISTORY SECTION :  Chief Complaint  Patient presents with  . Knee Pain    left knee gave out and twisted about a week ago    69 year old male history of 2 years of pain in his left knee off and on but then twisted his knee when he stepped up with the left knee 2 weeks ago had increased pain swelling and had to basically rest his knee.  He took some Tylenol use some icing to get better although pain is not completely resolved    Review of Systems  Respiratory: Positive for shortness of breath.   Musculoskeletal: Positive for falls and joint pain.     has a past medical history of Adie's pupil, Anemia (07/14/2013), Borderline diabetes, CAD (coronary artery disease), native coronary artery (04/09/2013), Depression (1983), Essential hypertension, Exertional shortness of breath, Hodgkin's lymphoma ([place]) (1983), Hypercholesterolemia, Hypothyroidism, Invasive ductal carcinoma of breast, stage 2 ([place]) (04/27/2011), Left carotid artery partial occlusion (04/27/2011), Mild aortic stenosis by prior echocardiogram (02/2018), Mild mitral stenosis by prior echocardiogram (08/06/2014), Personal history of chemotherapy, Restless leg syndrome, Right carotid artery occlusion (04/27/2011), S/P CABG x 3 (04/09/2013), Stroke ([place]), and Thyroid disease.   Past Surgical History:  Procedure Laterality Date  . BREAST BIOPSY Right   . CARDIAC CATHETERIZATION  2014  . CATARACT EXTRACTION W/PHACO Right 06/21/2015   Procedure: CATARACT EXTRACTION PHACO AND INTRAOCULAR LENS PLACEMENT (Experiment);  Surgeon: Tonny Branch, MD;  Location: [facility];  Service: Ophthalmology;  Laterality: Right;  CDE: 7.05  . CATARACT EXTRACTION W/PHACO Left 07/01/2015   Procedure: CATARACT EXTRACTION PHACO AND INTRAOCULAR LENS PLACEMENT (IOC);  Surgeon: Tonny Branch, MD;  Location: [facility];  Service: Ophthalmology;  Laterality: Left;  CDE: 7.49  . COLONOSCOPY [name] 05/06/2015   Procedure:  COLONOSCOPY;  Surgeon: Rogene Houston, MD;  [facility];  Service: Endoscopy;  Laterality: [name];  930  . CORONARY ARTERY BYPASS GRAFT [name] 04/08/2013   Procedure: CORONARY ARTERY BYPASS GRAFTING (CABG);  Surgeon: Ivin Poot, MD;  Location: [place];  Service: Open Heart Surgery;  Laterality: [name];  Times 3 using left internal mammary artery; endoscopically harvested right saphenous vein and left radial artery  . INTRAOPERATIVE TRANSESOPHAGEAL ECHOCARDIOGRAM [name] 04/08/2013   Procedure: INTRAOPERATIVE TRANSESOPHAGEAL ECHOCARDIOGRAM;  Surgeon: Ivin Poot, MD;  Location: [place];  Service: Open Heart Surgery;  Laterality: [name];  . LAPAROSCOPIC APPENDECTOMY [name] 12/23/2015   Procedure: APPENDECTOMY LAPAROSCOPIC;  Surgeon: Donnie Mesa, MD;  Location: [place];  Service: General;  Laterality: [name];  . LEFT HEART CATHETERIZATION WITH CORONARY ANGIOGRAM [name] 04/07/2013   Procedure: LEFT HEART CATHETERIZATION WITH CORONARY ANGIOGRAM;  Surgeon: Leonie Man, MD;  Location: [facility] C[facility] LAB;  Service: Cardiovascular;  Laterality: [name];  . LYMPH NODE BIOPSY Left 1983   neck  . MASTECTOMY Right    w/axillary lymph node dissection  . NM MYOVIEW LTD  08/2019    EF 55 to 60%.  LOW RISK.  No ischemia or infarction noted.  Marland Kitchen NM TREADMILL MYOVIEW LTD  08/06/2014   Exercise 5:07 min, 6.3 METS; symptoms noted or extreme dyspnea and lightheadedness with some chest tightness. No EKG changes. No ischemia or infarction was normal EF.  Marland Kitchen RADIAL ARTERY HARVEST Left 04/08/2013   Procedure: RADIAL ARTERY HARVEST;  Surgeon: Ivin Poot, MD;  Location: [place];  Service: Open Heart Surgery;  Laterality: Left;  . TONSILLECTOMY  1974  . TRANSTHORACIC ECHOCARDIOGRAM  08/06/2014    Normal EF 55-60%. Gr 1 DD - increased LVEDP. Moderate aortic valve thickening suggestive of AoV Sclerosis but not stenosis. Mild AI; MIld MS - mean gradient of 7 mmHg & HR of 122 bpm.  . TRANSTHORACIC ECHOCARDIOGRAM  02/2018   Initial report: EF  55-60%, GRII DD.  High filling pressures.  Cannot exclude bicuspid morphology of aortic valve with severely calcified leaflets.  Mild aortic root dilation.  Severe MAC, mild stenosis (valve area by P1/2 T & continuity equation ~2 cm).  Mod RV dilation with mild to mod reduced EF.---> Limited echo with Definity - > no RWMA. Mild AS, Mod MS (similat MVA, mean gradient 8 mmHg)  . TRANSTHORACIC ECHOCARDIOGRAM  03/2019    EF 60-65%, mild concentric LVH. Gr 2 DD. No RWMA. Mild-Mod RV dilation & enlargement. Mild LA dilation. Mod MV Calcification . Mod MS (previously read as mild, but mean gradient is lower than and estimated valve area is higher than last check).  Mild-Mod AS (Mean Gradient 13.5 mmHg)    Body mass index is 29.37 kg/m.   No Known Allergies   Current Outpatient Medications:  .  acetaminophen (TYLENOL) 500 MG tablet, Take 500 mg by mouth every 6 (six) hours as needed for mild pain. , Disp: , Rfl:  .  albuterol (PROVENTIL HFA;VENTOLIN HFA) 108 (90 Base) MCG/ACT inhaler, Inhale 2 puffs into the lungs every 6 (six) hours as needed for wheezing or shortness of breath., Disp: , Rfl:  .  ALPRAZolam (XANAX) 1 MG tablet, Take 0.5 mg by mouth at bedtime as needed for anxiety. , Disp: , Rfl:  .  ANORO ELLIPTA 62.5-25 MCG/INH AEPB, , Disp: , Rfl:  .  aspirin EC 81 MG tablet, Take 1 tablet (81 mg total) by mouth daily., Disp: 90 tablet, Rfl: 3 .  Cholecalciferol (VITAMIN D-3 PO), Take 1 tablet by mouth daily. , Disp: , Rfl:  .  CINNAMON PO, Take by mouth., Disp: , Rfl:  .  citalopram (CELEXA) 20 MG tablet, Take 40 mg by mouth every evening. , Disp: , Rfl:  .  Cyanocobalamin (VITAMIN B 12 PO), Take by mouth., Disp: , Rfl:  .  diphenhydrAMINE (BENADRYL) 25 MG tablet, Take 25 mg by mouth daily as needed for allergies., Disp: , Rfl:  .  ECHINACEA EXTRACT PO, Take by mouth., Disp: , Rfl:  .  Garlic 330 MG CAPS, Take 500 mg by mouth daily., Disp: , Rfl:  .  Ginkgo Biloba 60 MG CAPS, Take 1 capsule by  mouth daily., Disp: , Rfl:  .  levothyroxine (SYNTHROID, LEVOTHROID) 75 MCG tablet, Take 75 mcg by mouth daily. , Disp: , Rfl:  .  Magnesium 250 MG TABS, Take 1 tablet by mouth daily., Disp: , Rfl:  .  nitroGLYCERIN (NITROSTAT) 0.4 MG SL tablet, Place 1 tablet (0.4 mg total) under the tongue every 5 (five) minutes as needed for chest pain., Disp: 25 tablet, Rfl: 3 .  OVER THE COUNTER MEDICATION, Take 1 tablet by mouth daily. Ultra kidney complex, Disp: , Rfl:  .  Red Yeast Rice 600 MG CAPS, Take 600 mg by mouth daily. Per pt, Disp: , Rfl:  .  rOPINIRole (REQUIP) 4 MG tablet, Take 4 mg by mouth 2 (two) times daily. Per pt, Disp: , Rfl:  .  simvastatin (ZOCOR) 80 MG tablet, , Disp: , Rfl:  .  rosuvastatin (CRESTOR) 40 MG tablet, Take 1 tablet (40 mg total) by mouth daily. (Patient not taking: Reported on  10/29/2019), Disp: 90 tablet, Rfl: 3   PHYSICAL EXAM SECTION: 1) BP (!) 145/69   Pulse 87   Ht _0  (1.905 m)   Wt 235 lb (106.6 kg)   BMI 29.37 kg/m   Body mass index is 29.37 kg/m. General appearance: Well-developed well-nourished no gross deformities  2) Cardiovascular normal pulse and perfusion in the lower extremities normal color without edema  3) Neurologically deep tendon reflexes are equal and normal,  no pathologic reflexes  4) Psychological: Awake alert and oriented x3 mood and affect normal  5) Skin no lacerations or ulcerations no nodularity no palpable masses, no erythema or nodularity  6) Musculoskeletal:   Right knee skin normal range of motion full stability test intact motor function normal  Left knee skin normal except for an old brown recluse spider bite.  Tenderness medial joint line range of motion normal no effusion no instability normal muscle tone   MEDICAL DECISION MAKING  A.  Encounter Diagnosis  Name Primary?  . Chronic pain of left knee Yes    B. DATA ANALYSED:  X-rays in office show chondrocalcinosis medial side of the knee mild  arthritis  IMAGING: Independent interpretation of images: None Orders: X-ray order simply reported as noted above Outside records reviewed: None C. MANAGEMENT recommend rest, injection, Tylenol, no other prescriptions  Procedure note left knee injection   verbal consent was obtained to inject left knee joint  Timeout was completed to confirm the site of injection  The medications used were 40 mg of Depo-Medrol and 1% lidocaine 3 cc  Anesthesia was provided by ethyl chloride and the skin was prepped with alcohol.  After cleaning the skin with alcohol a 20-gauge needle was used to inject the left knee joint. There were no complications. A sterile bandage was applied.  If things are not better in a couple of weeks I told him to call us back we may have to do an MRI I am concerned because he has a pretty fairly significant cardiac history and would rather not operate.  Summary chronic problem with exacerbation prescription management none injection No orders of the defined types were placed in this encounter.     Arther Abbott, MD  10/29/2019 11:33 AM

## 2019-10-29 NOTE — Patient Instructions (Addendum)
I think you tore the meniscus but with your heart condition I prefer to try to treat this without surgery   Take 500 mg of tylenol every 6 hrs as needed for pain    You have received an injection of steroids into the joint. 15% of patients will have increased pain within the 24 hours postinjection.   This is transient and will go away.   We recommend that you use ice packs on the injection site for 20 minutes every 2 hours and extra strength Tylenol 2 tablets every 8 as needed until the pain resolves.  If you continue to have pain after taking the Tylenol and using the ice please call the office for further instructions.

## 2019-11-18 DIAGNOSIS — E785 Hyperlipidemia, unspecified: Secondary | ICD-10-CM | POA: Diagnosis not present

## 2019-11-21 LAB — COMPREHENSIVE METABOLIC PANEL
ALT: 69 IU/L — ABNORMAL HIGH (ref 0–44)
AST: 39 IU/L (ref 0–40)
Albumin/Globulin Ratio: 2 (ref 1.2–2.2)
Albumin: 4.2 g/dL (ref 3.8–4.8)
Alkaline Phosphatase: 102 IU/L (ref 39–117)
BUN/Creatinine Ratio: 15 (ref 10–24)
BUN: 20 mg/dL (ref 8–27)
Bilirubin Total: 0.9 mg/dL (ref 0.0–1.2)
CO2: 25 mmol/L (ref 20–29)
Calcium: 9.3 mg/dL (ref 8.6–10.2)
Chloride: 105 mmol/L (ref 96–106)
Creatinine, Ser: 1.36 mg/dL — ABNORMAL HIGH (ref 0.76–1.27)
GFR calc Af Amer: 61 mL/min/{1.73_m2} (ref >59)
GFR calc non Af Amer: 52 mL/min/{1.73_m2} — ABNORMAL LOW (ref >59)
Globulin, Total: 2.1 g/dL (ref 1.5–4.5)
Glucose: 114 mg/dL — ABNORMAL HIGH (ref 65–99)
Potassium: 4.8 mmol/L (ref 3.5–5.2)
Sodium: 143 mmol/L (ref 134–144)
Total Protein: 6.3 g/dL (ref 6.0–8.5)

## 2019-11-21 LAB — LIPID PANEL
Chol/HDL Ratio: 2.3 ratio (ref 0.0–5.0)
Cholesterol, Total: 141 mg/dL (ref 100–199)
HDL: 62 mg/dL (ref >39)
LDL Chol Calc (NIH): 65 mg/dL (ref 0–99)
Triglycerides: 69 mg/dL (ref 0–149)
VLDL Cholesterol Cal: 14 mg/dL (ref 5–40)

## 2019-11-27 ENCOUNTER — Ambulatory Visit: Payer: Medicare Other

## 2019-12-01 DIAGNOSIS — Z23 Encounter for immunization: Secondary | ICD-10-CM | POA: Diagnosis not present

## 2019-12-04 ENCOUNTER — Other Ambulatory Visit: Payer: Self-pay

## 2019-12-04 ENCOUNTER — Ambulatory Visit (INDEPENDENT_AMBULATORY_CARE_PROVIDER_SITE_OTHER): Payer: Medicare Other | Admitting: Pharmacist Clinician (PhC)/ Clinical Pharmacy Specialist

## 2019-12-04 DIAGNOSIS — I200 Unstable angina: Secondary | ICD-10-CM | POA: Diagnosis not present

## 2019-12-04 DIAGNOSIS — E785 Hyperlipidemia, unspecified: Secondary | ICD-10-CM | POA: Diagnosis not present

## 2019-12-04 MED ORDER — NITROGLYCERIN 0.4 MG SL SUBL: 0.4000 mg | SUBLINGUAL_TABLET | SUBLINGUAL | 12 refills | Status: AC | PRN

## 2019-12-04 NOTE — Patient Instructions (Addendum)
Your Results:             Your most recent labs Goal  Total Cholesterol 141 < 200  Triglycerides 69 < 150  HDL (happy/good cholesterol) 62 > 40  LDL (lousy/bad cholesterol 65 < 70     Medication changes:  Continue with simvastatin 80 mg daily  Lab orders:  Repeat labs in 6 months  Thank you for choosing [facility]

## 2019-12-04 NOTE — Assessment & Plan Note (Addendum)
Patient with ASCVD and hyperlipidemia, now at LDL goal on simvastatin 80 mg daily.  Had a lengthy discussion about his medication and concerns that it can increase risk of muscle pains should he have other medications added.  He is aware to double check with his pharmacy when getting new medications.  Repeat lipids in 6 months.

## 2019-12-04 NOTE — Progress Notes (Signed)
12/05/2019 Ryan [name] [birth year] [id number]   HPI:  Ryan [name] is a 69 y.o. male patient of Dr Ellyn Hack, who presents today for a lipid clinic evaluation.  See pertinent past medical history below.  He was last seen by Dr. Ellyn Hack in November and started on rosuvastatin 40 mg.   Previously he had been on simvastatin 40 mg once weekly with red yeast rice 6 days per week.   After just 1-2 weeks of rosuvastatin he was feeling out of sorts, nauseated and "foggy".  He stopped and went back to simvastatin, but somehow the dose was increased to 80 mg daily.  He has taken this for 3 months without any issues.  No signs of myalgia or rhabdomyolysis.    Today he is in the office for follow up.   He has been feeling well and has no concerns.  Blood work done last week shows that his lipid labs are all at goal.    Past Medical History: ASCVD CABG x 3 2014, occluded R carotid, stroke 2007 - intolerant of ACEI/ARB/BB  OSA On CPAP  hypertension Currently not treated d/t problems with orthostatic hypotension  hypothyroid On levothyroxine 75 mcg - no current TSH on file    Current Medications:  Rosuvastatin 40 mg   Cholesterol Goals: LDL < 70   Intolerant/previously tried: rosuvastatin made him feel off kilter, stomach upset,   Family history:  Father died at 23, mother at 32 from DM, dementia; no siblings, 2 daughters with no known issues  Diet: mostly home cooked meals, mix of proteins, vegetables  Exercise:  "I get up every morning" - no regular exercise  Labs: 2/21:  TC 141, TG 69, HLD 62, LDL 65 - simvastatin 80 mg   Current Outpatient Medications  Medication Sig Dispense Refill  . acetaminophen (TYLENOL) 500 MG tablet Take 500 mg by mouth every 6 (six) hours as needed for mild pain.     Marland Kitchen albuterol (PROVENTIL HFA;VENTOLIN HFA) 108 (90 Base) MCG/ACT inhaler Inhale 2 puffs into the lungs every 6 (six) hours as needed for wheezing or shortness of breath.    . ALPRAZolam (XANAX) 1 MG  tablet Take 0.5 mg by mouth at bedtime as needed for anxiety.     Jearl Klinefelter ELLIPTA 62.5-25 MCG/INH AEPB     . aspirin EC 81 MG tablet Take 1 tablet (81 mg total) by mouth daily. 90 tablet 3  . Cholecalciferol (VITAMIN D-3 PO) Take 1 tablet by mouth daily.     Marland Kitchen CINNAMON PO Take by mouth.    . citalopram (CELEXA) 20 MG tablet Take 40 mg by mouth every evening.     . Cyanocobalamin (VITAMIN B 12 PO) Take by mouth.    . diphenhydrAMINE (BENADRYL) 25 MG tablet Take 25 mg by mouth daily as needed for allergies.    Marland Kitchen ECHINACEA EXTRACT PO Take by mouth.    . Garlic [id number] MG CAPS Take 500 mg by mouth daily.    . Ginkgo Biloba 60 MG CAPS Take 1 capsule by mouth daily.    Marland Kitchen levothyroxine (SYNTHROID, LEVOTHROID) 75 MCG tablet Take 75 mcg by mouth daily.     . Magnesium 250 MG TABS Take 1 tablet by mouth daily.    . nitroGLYCERIN (NITROSTAT) 0.4 MG SL tablet Place 1 tablet (0.4 mg total) under the tongue every 5 (five) minutes as needed for chest pain. 25 tablet 12  . OVER THE COUNTER MEDICATION Take 1 tablet by mouth daily. Ultra kidney  complex    . rOPINIRole (REQUIP) 4 MG tablet Take 4 mg by mouth 2 (two) times daily. Per pt    . simvastatin (ZOCOR) 80 MG tablet      No current facility-administered medications for this visit.    No Known Allergies  Past Medical History:  Diagnosis Date  . Adie's pupil   . Anemia 07/14/2013  . Borderline diabetes   . CAD (coronary artery disease), native coronary artery 04/09/2013   Dist LM stenosis 70-80% (at trifurcation into LAD, CX, & 2 Ramus branches, 70% mid RCA);; b) Myoview 08/06/14: Low Risk, no Ischemia/infarction  . Depression 1983   "after I was dx'd w/CA; now just comes in spells"  (12/22/2015)  . Essential hypertension   . Exertional shortness of breath   . Hodgkin's lymphoma ([place]) 1983  . Hypercholesterolemia   . Hypothyroidism   . Invasive ductal carcinoma of breast, stage 2 ([place]) 04/27/2011   chemo, mastectomy  . Left carotid artery partial  occlusion 04/27/2011   CAROTIC DOPPLER 11/2013: < 40% R ICA stenosis, distal waveforms damped - suggests probably intracranial occlusoin, < [id number] LICA, normal vertebrals -- no change  . Mild aortic stenosis by prior echocardiogram 02/2018   By echo June 2020: Mild-Mod AS (Mean Gradient 13.5 mmHg)  . Mild mitral stenosis by prior echocardiogram 08/06/2014   Most recent echo June 2020: Estimated valve area 2.83 cm (in 2019 was roughly 2 cm).  Mean gradient 6-8 mmHg.  Appears to be stable.  . Personal history of chemotherapy   . Restless leg syndrome   . Right carotid artery occlusion 04/27/2011  . S/P CABG x 3 04/09/2013   LIMA-LAD, fLRAD-RI, SVG-RCA; Echo 10/29/'15:  mild Conc LVH, no WMA, Gr 1 DD, Mod AoV Sclerosis w/ Mod AI, ~Mild-Mod MS   . Stroke [facility])    2007  . Thyroid disease     Blood pressure 132/64, pulse 78, resp. rate 16, height 6\' 3"  (1.905 m), weight 236 lb (107 kg), SpO2 96 %.   Hyperlipidemia with target LDL less than 70 Patient with ASCVD and hyperlipidemia, now at LDL goal on simvastatin 80 mg daily.  Had a lengthy discussion about his medication and concerns that it can increase risk of muscle pains should he have other medications added.  He is aware to double check with his pharmacy when getting new medications.  Repeat lipids in 6 months.    [name] PharmD CPP [facility] [address] [place], [place] [address] [phone]

## 2019-12-05 ENCOUNTER — Encounter: Payer: Self-pay | Admitting: Pharmacist Clinician (PhC)/ Clinical Pharmacy Specialist

## 2019-12-16 ENCOUNTER — Other Ambulatory Visit (HOSPITAL_COMMUNITY): Payer: Self-pay | Admitting: Nurse Practitioner

## 2019-12-16 DIAGNOSIS — Z79899 Other long term (current) drug therapy: Secondary | ICD-10-CM | POA: Diagnosis not present

## 2019-12-16 DIAGNOSIS — Z853 Personal history of malignant neoplasm of breast: Secondary | ICD-10-CM

## 2019-12-16 DIAGNOSIS — Z08 Encounter for follow-up examination after completed treatment for malignant neoplasm: Secondary | ICD-10-CM

## 2019-12-16 DIAGNOSIS — I10 Essential (primary) hypertension: Secondary | ICD-10-CM | POA: Diagnosis not present

## 2019-12-29 DIAGNOSIS — R0989 Other specified symptoms and signs involving the circulatory and respiratory systems: Secondary | ICD-10-CM | POA: Diagnosis not present

## 2019-12-29 DIAGNOSIS — E119 Type 2 diabetes mellitus without complications: Secondary | ICD-10-CM | POA: Diagnosis not present

## 2019-12-29 DIAGNOSIS — N182 Chronic kidney disease, stage 2 (mild): Secondary | ICD-10-CM | POA: Diagnosis not present

## 2019-12-29 DIAGNOSIS — Z6829 Body mass index (BMI) 29.0-29.9, adult: Secondary | ICD-10-CM | POA: Diagnosis not present

## 2019-12-29 DIAGNOSIS — J9801 Acute bronchospasm: Secondary | ICD-10-CM | POA: Diagnosis not present

## 2019-12-29 DIAGNOSIS — I10 Essential (primary) hypertension: Secondary | ICD-10-CM | POA: Diagnosis not present

## 2019-12-29 DIAGNOSIS — Z0001 Encounter for general adult medical examination with abnormal findings: Secondary | ICD-10-CM | POA: Diagnosis not present

## 2019-12-29 DIAGNOSIS — Z1389 Encounter for screening for other disorder: Secondary | ICD-10-CM | POA: Diagnosis not present

## 2019-12-29 DIAGNOSIS — E782 Mixed hyperlipidemia: Secondary | ICD-10-CM | POA: Diagnosis not present

## 2019-12-29 DIAGNOSIS — J45909 Unspecified asthma, uncomplicated: Secondary | ICD-10-CM | POA: Diagnosis not present

## 2019-12-29 DIAGNOSIS — E663 Overweight: Secondary | ICD-10-CM | POA: Diagnosis not present

## 2019-12-29 DIAGNOSIS — Z23 Encounter for immunization: Secondary | ICD-10-CM | POA: Diagnosis not present

## 2020-01-06 ENCOUNTER — Ambulatory Visit (HOSPITAL_COMMUNITY)
Admission: RE | Admit: 2020-01-06 | Discharge: 2020-01-06 | Disposition: A | Discharge status: Home / Self Care | Payer: Medicare Other | Source: Ambulatory Visit | Attending: Nurse Practitioner | Admitting: Nurse Practitioner

## 2020-01-06 ENCOUNTER — Inpatient Hospital Stay (HOSPITAL_COMMUNITY): Payer: Medicare Other | Attending: Hematology

## 2020-01-06 ENCOUNTER — Other Ambulatory Visit: Payer: Self-pay

## 2020-01-06 ENCOUNTER — Encounter (HOSPITAL_COMMUNITY): Payer: Medicare Other

## 2020-01-06 DIAGNOSIS — Z853 Personal history of malignant neoplasm of breast: Secondary | ICD-10-CM | POA: Diagnosis not present

## 2020-01-06 DIAGNOSIS — C50911 Malignant neoplasm of unspecified site of right female breast: Secondary | ICD-10-CM

## 2020-01-06 DIAGNOSIS — Z9011 Acquired absence of right breast and nipple: Secondary | ICD-10-CM | POA: Diagnosis not present

## 2020-01-06 DIAGNOSIS — Z08 Encounter for follow-up examination after completed treatment for malignant neoplasm: Secondary | ICD-10-CM

## 2020-01-06 DIAGNOSIS — R928 Other abnormal and inconclusive findings on diagnostic imaging of breast: Secondary | ICD-10-CM | POA: Diagnosis not present

## 2020-01-06 LAB — COMPREHENSIVE METABOLIC PANEL WITH GFR
ALT: 40 U/L (ref 0–44)
AST: 35 U/L (ref 15–41)
Albumin: 3.6 g/dL (ref 3.5–5.0)
Alkaline Phosphatase: 68 U/L (ref 38–126)
Anion gap: 8 (ref 5–15)
BUN: 18 mg/dL (ref 8–23)
CO2: 25 mmol/L (ref 22–32)
Calcium: 8.6 mg/dL — ABNORMAL LOW (ref 8.9–10.3)
Chloride: 107 mmol/L (ref 98–111)
Creatinine, Ser: 1.27 mg/dL — ABNORMAL HIGH (ref 0.61–1.24)
GFR calc Af Amer: >60 mL/min
GFR calc non Af Amer: 57 mL/min — ABNORMAL LOW
Glucose, Bld: 210 mg/dL — ABNORMAL HIGH (ref 70–99)
Potassium: 4.4 mmol/L (ref 3.5–5.1)
Sodium: 140 mmol/L (ref 135–145)
Total Bilirubin: 0.6 mg/dL (ref 0.3–1.2)
Total Protein: 6.1 g/dL — ABNORMAL LOW (ref 6.5–8.1)

## 2020-01-06 LAB — VITAMIN D 25 HYDROXY (VIT D DEFICIENCY, FRACTURES): Vit D, 25-Hydroxy: 53.86 ng/mL (ref 30–100)

## 2020-01-06 LAB — CBC WITH DIFFERENTIAL/PLATELET
Abs Immature Granulocytes: 0.03 10*3/uL (ref 0.00–0.07)
Basophils Absolute: 0.1 10*3/uL (ref 0.0–0.1)
Basophils Relative: 1 %
Eosinophils Absolute: 0.4 10*3/uL (ref 0.0–0.5)
Eosinophils Relative: 6 %
HCT: 40.9 % (ref 39.0–52.0)
Hemoglobin: 12.7 g/dL — ABNORMAL LOW (ref 13.0–17.0)
Immature Granulocytes: 0 %
Lymphocytes Relative: 16 %
Lymphs Abs: 1.1 10*3/uL (ref 0.7–4.0)
MCH: 30.1 pg (ref 26.0–34.0)
MCHC: 31.1 g/dL (ref 30.0–36.0)
MCV: 96.9 fL (ref 80.0–100.0)
Monocytes Absolute: 0.6 10*3/uL (ref 0.1–1.0)
Monocytes Relative: 9 %
Neutro Abs: 4.6 10*3/uL (ref 1.7–7.7)
Neutrophils Relative %: 68 %
Platelets: 191 10*3/uL (ref 150–400)
RBC: 4.22 MIL/uL (ref 4.22–5.81)
RDW: 13.6 % (ref 11.5–15.5)
WBC: 6.8 10*3/uL (ref 4.0–10.5)
nRBC: 0 % (ref 0.0–0.2)

## 2020-01-06 LAB — LACTATE DEHYDROGENASE: LDH: 191 U/L (ref 98–192)

## 2020-01-15 ENCOUNTER — Other Ambulatory Visit: Payer: Self-pay

## 2020-01-15 ENCOUNTER — Inpatient Hospital Stay (HOSPITAL_COMMUNITY): Payer: Medicare Other | Attending: Hematology | Admitting: Nurse Practitioner

## 2020-01-15 DIAGNOSIS — C50911 Malignant neoplasm of unspecified site of right female breast: Secondary | ICD-10-CM

## 2020-01-15 DIAGNOSIS — Z853 Personal history of malignant neoplasm of breast: Secondary | ICD-10-CM | POA: Diagnosis not present

## 2020-01-15 DIAGNOSIS — Z9011 Acquired absence of right breast and nipple: Secondary | ICD-10-CM | POA: Insufficient documentation

## 2020-01-15 DIAGNOSIS — Z9221 Personal history of antineoplastic chemotherapy: Secondary | ICD-10-CM | POA: Diagnosis not present

## 2020-01-15 DIAGNOSIS — Z8572 Personal history of non-Hodgkin lymphomas: Secondary | ICD-10-CM | POA: Diagnosis not present

## 2020-01-15 NOTE — Progress Notes (Signed)
[place], [name] [address]   CLINIC:  Medical Oncology/Hematology  PCP:  [facility], [place] [address] [phone]   REASON FOR VISIT: Follow-up for breast cancer  CURRENT THERAPY: Surveillance  BRIEF ONCOLOGIC HISTORY:  Oncology History  Invasive ductal carcinoma of right breast, stage 2- 2012 s/p chemo and mastectomy  12/14/2010 Initial Diagnosis   Right needle core biopsy at 9 o'clock position positive for invasive ductal carcinoma   01/17/2011 Surgery   Right simple mastectomy showing a 2.1 cm invasive ductal carcinoma, clear marhins, 0/2 lymph nodes.  ER 96%, PR 84%, Ki-67 19%, Her2 negative.   03/08/2011 - 03/09/2016 Chemotherapy   Tamoxifen x 5 years   08/17/2014 Genetic Testing   Patient has genetic testing done forBreast/Ovarian cancer panel through [facility] . 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    08/09/2015 Pathology Results   BCI- Low risk of late recurrence years 5-10 (3.7%), low likelihood of benefit from extended endocrine therapy, low risk fo overall recurrence years 0-10 (6.6%), and low liklihood of benefit from extended endocrine therapy.   Hodgkin's [id number]  04/27/2011 Initial Diagnosis   Hodgkin's [id number]      CANCER STAGING: Cancer Staging Invasive ductal carcinoma of right breast, stage 2- 2012 s/p chemo and mastectomy Staging form: Breast, AJCC 7th Edition - Clinical: Stage IIA (T2, N0, cM0) - Signed by Baird Cancer, PA on 04/27/2011    INTERVAL HISTORY:  Mr. Troia 69 y.o. male returns for routine follow-up for breast cancer.  Patient reports he is doing well since last visit.  He denies any new lumps or bumps present. Denies any nausea, vomiting, or diarrhea. Denies any new pains. Had not noticed any recent bleeding such as epistaxis, hematuria or hematochezia. Denies recent chest pain on  exertion, shortness of breath on minimal exertion, pre-syncopal episodes, or palpitations. Denies any numbness or tingling in hands or feet. Denies any recent fevers, infections, or recent hospitalizations. Patient reports appetite at 100% and energy level at 75%.  He is eating well maintain his weight at this time.     REVIEW OF SYSTEMS:  Review of Systems  Respiratory: Positive for shortness of breath.   All other systems reviewed and are negative.    PAST MEDICAL/SURGICAL HISTORY:  Past Medical History:  Diagnosis Date  . Adie's pupil   . Anemia 07/14/2013  . Borderline diabetes   . CAD (coronary artery disease), native coronary artery 04/09/2013   Dist LM stenosis 70-80% (at trifurcation into LAD, CX, & 2 Ramus branches, 70% mid RCA);; b) Myoview 08/06/14: Low Risk, no Ischemia/infarction  . Depression 1983   "after I was dx'd w/CA; now just comes in spells"  (12/22/2015)  . Essential hypertension   . Exertional shortness of breath   . Hodgkin's lymphoma ([facility]) 1983  . Hypercholesterolemia   . Hypothyroidism   . Invasive ductal carcinoma of breast, stage 2 ([facility]) 04/27/2011   chemo, mastectomy  . Left carotid artery partial occlusion 04/27/2011   CAROTIC DOPPLER 11/2013: < 40% R ICA stenosis, distal waveforms damped - suggests probably intracranial occlusoin, < 44% LICA, normal vertebrals -- no change  . Mild aortic stenosis by prior echocardiogram 02/2018   By echo June 2020: Mild-Mod AS (Mean Gradient 13.5 mmHg)  . Mild mitral stenosis by prior echocardiogram 08/06/2014   Most recent echo June 2020: Estimated valve area 2.83 cm (in 2019 was roughly  2 cm).  Mean gradient 6-8 mmHg.  Appears to be stable.  . Personal history of chemotherapy   . Restless leg syndrome   . Right carotid artery occlusion 04/27/2011  . S/P CABG x 3 04/09/2013   LIMA-LAD, fLRAD-RI, SVG-RCA; Echo 10/29/'15:  mild Conc LVH, no WMA, Gr 1 DD, Mod AoV Sclerosis w/ Mod AI, ~Mild-Mod MS   . Stroke [facility])    2007   . Thyroid disease    Past Surgical History:  Procedure Laterality Date  . BREAST BIOPSY Right   . CARDIAC CATHETERIZATION  2014  . CATARACT EXTRACTION W/PHACO Right 06/21/2015   Procedure: CATARACT EXTRACTION PHACO AND INTRAOCULAR LENS PLACEMENT ([place]);  Surgeon: Tonny Branch, MD;  Location: [facility];  Service: Ophthalmology;  Laterality: Right;  CDE: 7.05  . CATARACT EXTRACTION W/PHACO Left 07/01/2015   Procedure: CATARACT EXTRACTION PHACO AND INTRAOCULAR LENS PLACEMENT ([facility]);  Surgeon: Tonny Branch, MD;  Location: [facility];  Service: Ophthalmology;  Laterality: Left;  CDE: 7.49  . COLONOSCOPY [name] 05/06/2015   Procedure: COLONOSCOPY;  Surgeon: Rogene Houston, MD;  Location: [facility];  Service: Endoscopy;  Laterality: [name];  930  . CORONARY ARTERY BYPASS GRAFT [name] 04/08/2013   Procedure: CORONARY ARTERY BYPASS GRAFTING (CABG);  Surgeon: Ivin Poot, MD;  Location: [place];  Service: Open Heart Surgery;  Laterality: [name];  Times 3 using left internal mammary artery; endoscopically harvested right saphenous vein and left radial artery  . INTRAOPERATIVE TRANSESOPHAGEAL ECHOCARDIOGRAM [name] 04/08/2013   Procedure: INTRAOPERATIVE TRANSESOPHAGEAL ECHOCARDIOGRAM;  Surgeon: Ivin Poot, MD;  Location: [place];  Service: Open Heart Surgery;  Laterality: [name];  . LAPAROSCOPIC APPENDECTOMY [name] 12/23/2015   Procedure: APPENDECTOMY LAPAROSCOPIC;  Surgeon: Donnie Mesa, MD;  Location: [place];  Service: General;  Laterality: [name];  . LEFT HEART CATHETERIZATION WITH CORONARY ANGIOGRAM [name] 04/07/2013   Procedure: LEFT HEART CATHETERIZATION WITH CORONARY ANGIOGRAM;  Surgeon: Leonie Man, MD;  Location: [facility] CATH LAB;  Service: Cardiovascular;  Laterality: [name];  . LYMPH NODE BIOPSY Left 1983   neck  . MASTECTOMY Right    w/axillary lymph node dissection  . NM MYOVIEW LTD  08/2019    EF 55 to 60%.  LOW RISK.  No ischemia or infarction noted.  Marland Kitchen NM TREADMILL MYOVIEW LTD  08/06/2014   Exercise 5:07 min, 6.3 METS; symptoms  noted or extreme dyspnea and lightheadedness with some chest tightness. No EKG changes. No ischemia or infarction was normal EF.  Marland Kitchen RADIAL ARTERY HARVEST Left 04/08/2013   Procedure: RADIAL ARTERY HARVEST;  Surgeon: Ivin Poot, MD;  Location: [place];  Service: Open Heart Surgery;  Laterality: Left;  . TONSILLECTOMY  1974  . TRANSTHORACIC ECHOCARDIOGRAM  08/06/2014    Normal EF 55-60%. Gr 1 DD - increased LVEDP. Moderate aortic valve thickening suggestive of AoV Sclerosis but not stenosis. Mild AI; MIld MS - mean gradient of 7 mmHg & HR of 122 bpm.  . TRANSTHORACIC ECHOCARDIOGRAM  02/2018   Initial report: EF 55-60%, GRII DD.  High filling pressures.  Cannot exclude bicuspid morphology of aortic valve with severely calcified leaflets.  Mild aortic root dilation.  Severe MAC, mild stenosis (valve area by P1/2 T & continuity equation ~2 cm).  Mod RV dilation with mild to mod reduced EF.---> Limited echo with Definity - > no RWMA. Mild AS, Mod MS (similat MVA, mean gradient 8 mmHg)  . TRANSTHORACIC ECHOCARDIOGRAM  03/2019    EF 60-65%, mild concentric LVH. Gr 2 DD. No  RWMA. Mild-Mod RV dilation & enlargement. Mild LA dilation. Mod MV Calcification . Mod MS (previously read as mild, but mean gradient is lower than and estimated valve area is higher than last check).  Mild-Mod AS (Mean Gradient 13.5 mmHg)     SOCIAL HISTORY:  Social History   Socioeconomic History  . Marital status: Married    Spouse name: Not on file  . Number of children: 2  . Years of education: Not on file  . Highest education level: Not on file  Occupational History  . Not on file  Tobacco Use  . Smoking status: Former Smoker    Packs/day: 1.00    Years: 15.00    Pack years: 15.00    Types: Cigarettes    Quit date: 06/09/1982    Years since quitting: 37.6  . Smokeless tobacco: Never Used  Substance and Sexual Activity  . Alcohol use: No  . Drug use: No  . Sexual activity: Never  Other Topics Concern  . Not on  file  Social History Narrative   Married. Former smoker who quit in 1983.   No routine exercise - gained ~40lb post CABG.   Social Determinants of Health   Financial Resource Strain:   . Difficulty of Paying Living Expenses:   Food Insecurity:   . Worried About Charity fundraiser in the Last Year:   . Arboriculturist in the Last Year:   Transportation Needs:   . Film/video editor (Medical):   Marland Kitchen Lack of Transportation (Non-Medical):   Physical Activity:   . Days of Exercise per Week:   . Minutes of Exercise per Session:   Stress:   . Feeling of Stress :   Social Connections:   . Frequency of Communication with Friends and Family:   . Frequency of Social Gatherings with Friends and Family:   . Attends Religious Services:   . Active Member of Clubs or Organizations:   . Attends Archivist Meetings:   Marland Kitchen Marital Status:   Intimate Partner Violence:   . Fear of Current or Ex-Partner:   . Emotionally Abused:   Marland Kitchen Physically Abused:   . Sexually Abused:     FAMILY HISTORY:  Family History  Problem Relation Age of Onset  . Alzheimer's disease Mother   . Stroke Mother   . Hypertension Mother   . Hyperlipidemia Mother   . Diabetes Mother   . Hypertension Father   . Diabetes Paternal Grandmother   . Diabetes Paternal Grandfather   . Breast cancer Paternal Aunt   . Ovarian cancer Paternal Aunt     CURRENT MEDICATIONS:  Outpatient Encounter Medications as of 01/15/2020  Medication Sig  . acetaminophen (TYLENOL) 500 MG tablet Take 500 mg by mouth every 6 (six) hours as needed for mild pain.   Marland Kitchen albuterol (PROVENTIL HFA;VENTOLIN HFA) 108 (90 Base) MCG/ACT inhaler Inhale 2 puffs into the lungs every 6 (six) hours as needed for wheezing or shortness of breath.  . ALPRAZolam (XANAX) 1 MG tablet Take 0.5 mg by mouth at bedtime as needed for anxiety.   Jearl Klinefelter ELLIPTA 62.5-25 MCG/INH AEPB   . aspirin EC 81 MG tablet Take 1 tablet (81 mg total) by mouth daily.  .  Cholecalciferol (VITAMIN D-3 PO) Take 1 tablet by mouth daily.   Marland Kitchen CINNAMON PO Take by mouth.  . citalopram (CELEXA) 20 MG tablet Take 40 mg by mouth every evening.   . Cyanocobalamin (VITAMIN B 12 PO) Take  by mouth.  . diphenhydrAMINE (BENADRYL) 25 MG tablet Take 25 mg by mouth daily as needed for allergies.  Marland Kitchen ECHINACEA EXTRACT PO Take by mouth.  . Garlic 327 MG CAPS Take 500 mg by mouth daily.  . Ginkgo Biloba 60 MG CAPS Take 1 capsule by mouth daily.  Marland Kitchen levothyroxine (SYNTHROID, LEVOTHROID) 75 MCG tablet Take 75 mcg by mouth daily.   . Magnesium 250 MG TABS Take 1 tablet by mouth daily.  . nitroGLYCERIN (NITROSTAT) 0.4 MG SL tablet Place 1 tablet (0.4 mg total) under the tongue every 5 (five) minutes as needed for chest pain.  Marland Kitchen OVER THE COUNTER MEDICATION Take 1 tablet by mouth daily. Ultra kidney complex  . rOPINIRole (REQUIP) 4 MG tablet Take 4 mg by mouth 2 (two) times daily. Per pt  . simvastatin (ZOCOR) 80 MG tablet    No facility-administered encounter medications on file as of 01/15/2020.    ALLERGIES:  No Known Allergies   PHYSICAL EXAM:  ECOG Performance status: 1  Vitals:   01/15/20 0900  BP: 126/86  Pulse: 94  Resp: 18  Temp: (!) 96.8 F (36 C)  SpO2: 96%   Filed Weights   01/15/20 0900  Weight: 235 lb (106.6 kg)    Physical Exam Constitutional:      Appearance: Normal appearance. He is normal weight.  Cardiovascular:     Rate and Rhythm: Normal rate and regular rhythm.     Heart sounds: Normal heart sounds.  Pulmonary:     Breath sounds: Wheezing present.  Abdominal:     General: Bowel sounds are normal.     Palpations: Abdomen is soft.  Musculoskeletal:        General: Normal range of motion.  Skin:    General: Skin is warm.  Neurological:     Mental Status: He is alert and oriented to person, place, and time. Mental status is at baseline.  Psychiatric:        Mood and Affect: Mood normal.        Behavior: Behavior normal.        Thought  Content: Thought content normal.        Judgment: Judgment normal.   Breast: Left: No palpable masses, no skin changes or nipple discharge, no adenopathy. Right: Mastectomy site well-healed no adenopathy or palpable masses.  LABORATORY DATA:  I have reviewed the labs as listed.  CBC    Component Value Date/Time   WBC 6.8 01/06/2020 1037   RBC 4.22 01/06/2020 1037   HGB 12.7 (L) 01/06/2020 1037   HCT 40.9 01/06/2020 1037   PLT 191 01/06/2020 1037   MCV 96.9 01/06/2020 1037   MCH 30.1 01/06/2020 1037   MCHC 31.1 01/06/2020 1037   RDW 13.6 01/06/2020 1037   LYMPHSABS 1.1 01/06/2020 1037   MONOABS 0.6 01/06/2020 1037   EOSABS 0.4 01/06/2020 1037   BASOSABS 0.1 01/06/2020 1037   CMP Latest Ref Rng & Units 01/06/2020 11/18/2019 08/22/2019  Glucose 70 - 99 mg/dL 210(H) 114(H) 126(H)  BUN 8 - 23 mg/dL _0 Creatinine 0.61 - 1.24 mg/dL 1.27(H) 1.36(H) 1.23  Sodium 135 - 145 mmol/L 140 143 144  Potassium 3.5 - 5.1 mmol/L 4.4 4.8 4.9  Chloride 98 - 111 mmol/L 107 105 106  CO2 22 - 32 mmol/L _1 Calcium 8.9 - 10.3 mg/dL 8.6(L) 9.3 9.4  Total Protein 6.5 - 8.1 g/dL 6.1(L) 6.3 6.7  Total Bilirubin 0.3 - 1.2 mg/dL 0.6 0.9  0.4  Alkaline Phos 38 - 126 U/L 68 102 97  AST 15 - 41 U/L 35 39 30  ALT 0 - 44 U/L 40 69(H) 44   DIAGNOSTIC IMAGING:  I have independently reviewed the mammogram scans and discussed with the patient.     I personally performed a face-to-face visit,   All questions were answered to patient's stated satisfaction. Encouraged patient to call with any new concerns or questions before his next visit to the cancer center and we can certain see him sooner, if needed.     ASSESSMENT & PLAN:   Invasive ductal carcinoma of right breast, stage 2- 2012 s/p chemo and mastectomy 1.  Stage II invasive ductal carcinoma the right breast: - He was initially diagnosed 12/14/2010 with invasive ductal carcinoma the right breast ER+/HER2- - Had a biopsy done on 01/17/2011. -  He had a right simple mastectomy on 03/08/2011 showing a 2.1 cm invasive ductal carcinoma with clear margins, 0 out of 2 lymph nodes.  ER 96%, PR 84%, Ki-67 19%, HER-2 negative. - He completed his chemotherapy and started tamoxifen in May 2012 and completed May 2017. - Oncotype score was 14. -Last mammogram on 01/06/2020 was B RADS category 2 benign. -He had labs drawn on 01/06/2020.  LDH was 191.  Creatinine was 1.27 which is stable.  All other labs were within normal limits. - On today's exam it did not reveal any adenopathy or lumps present. - He will follow-up in 1 year with repeat mammogram and labs.  2.  Hodgkin lymphoma: - He was treated in the early 80s with mantle radiation. - He was diagnosed with a right lymph node biopsy. - He is under observation at this time      Orders placed this encounter:  Orders Placed This Encounter  Procedures  . MM DIAG BREAST TOMO UNI LEFT  . Lactate dehydrogenase  . CBC with Differential/Platelet  . Comprehensive metabolic panel  . Vitamin B12  . VITAMIN D 25 Hydroxy (Vit-D Deficiency, Fractures)  . Folate      Francene Finders, [place] [phone]

## 2020-01-15 NOTE — Assessment & Plan Note (Addendum)
1.  Stage II invasive ductal carcinoma the right breast: - He was initially diagnosed 12/14/2010 with invasive ductal carcinoma the right breast ER+/HER2- - Had a biopsy done on 01/17/2011. - He had a right simple mastectomy on 03/08/2011 showing a 2.1 cm invasive ductal carcinoma with clear margins, 0 out of 2 lymph nodes.  ER 96%, PR 84%, Ki-67 19%, HER-2 negative. - He completed his chemotherapy and started tamoxifen in May 2012 and completed May 2017. - Oncotype score was 14. -Last mammogram on 01/06/2020 was B RADS category 2 benign. -He had labs drawn on 01/06/2020.  LDH was 191.  Creatinine was 1.27 which is stable.  All other labs were within normal limits. - On today's exam it did not reveal any adenopathy or lumps present. - He will follow-up in 1 year with repeat mammogram and labs.  2.  Hodgkin lymphoma: - He was treated in the early 80s with mantle radiation. - He was diagnosed with a right lymph node biopsy. - He is under observation at this time

## 2020-02-07 HISTORY — PX: TRANSTHORACIC ECHOCARDIOGRAM: SHX275

## 2020-02-18 ENCOUNTER — Other Ambulatory Visit: Payer: Self-pay

## 2020-02-18 ENCOUNTER — Ambulatory Visit (HOSPITAL_COMMUNITY)
Admission: RE | Admit: 2020-02-18 | Discharge: 2020-02-18 | Disposition: A | Discharge status: Home / Self Care | Payer: Medicare Other | Source: Ambulatory Visit | Attending: Cardiology | Admitting: Cardiology

## 2020-02-18 DIAGNOSIS — R0600 Dyspnea, unspecified: Secondary | ICD-10-CM | POA: Insufficient documentation

## 2020-02-18 DIAGNOSIS — I342 Nonrheumatic mitral (valve) stenosis: Secondary | ICD-10-CM | POA: Insufficient documentation

## 2020-02-18 DIAGNOSIS — R0609 Other forms of dyspnea: Secondary | ICD-10-CM

## 2020-02-18 DIAGNOSIS — Z951 Presence of aortocoronary bypass graft: Secondary | ICD-10-CM | POA: Diagnosis not present

## 2020-02-18 NOTE — Progress Notes (Signed)
Pump function is actually hyperdynamic with the upper limit of normal 65 to 70%.  This goes along with abnormal relaxation-grade 2 diastolic dysfunction that is a little bit more than would be expected for age.  Probably not that significant however because the left atrium is only mildly dilated. Interestingly, the right ventricle does seem to be a little bit dilated with mildly reduced function but pressures are okay.  The mitral valve has moderate narrowing, and the aortic valve has moderate narrowing and moderate regurgitation.  Overall pretty stable findings from last time.  The aortic valve appeared to be just a little bit more narrowed (increased from mild to moderate up to straight moderate) -> still not enough to do anything besides routine follow-up.  Glenetta Hew, MD

## 2020-02-18 NOTE — Progress Notes (Signed)
*  PRELIMINARY RESULTS* Echocardiogram 2D Echocardiogram has been performed.  Ryan [name] 02/18/2020, 11:38 AM

## 2020-03-08 DIAGNOSIS — I129 Hypertensive chronic kidney disease with stage 1 through stage 4 chronic kidney disease, or unspecified chronic kidney disease: Secondary | ICD-10-CM | POA: Diagnosis not present

## 2020-03-08 DIAGNOSIS — E1122 Type 2 diabetes mellitus with diabetic chronic kidney disease: Secondary | ICD-10-CM | POA: Diagnosis not present

## 2020-03-08 DIAGNOSIS — N183 Chronic kidney disease, stage 3 unspecified: Secondary | ICD-10-CM | POA: Diagnosis not present

## 2020-03-08 DIAGNOSIS — E039 Hypothyroidism, unspecified: Secondary | ICD-10-CM | POA: Diagnosis not present

## 2020-04-07 DIAGNOSIS — E039 Hypothyroidism, unspecified: Secondary | ICD-10-CM | POA: Diagnosis not present

## 2020-04-07 DIAGNOSIS — N1831 Chronic kidney disease, stage 3a: Secondary | ICD-10-CM | POA: Diagnosis not present

## 2020-04-07 DIAGNOSIS — I129 Hypertensive chronic kidney disease with stage 1 through stage 4 chronic kidney disease, or unspecified chronic kidney disease: Secondary | ICD-10-CM | POA: Diagnosis not present

## 2020-04-07 DIAGNOSIS — E1122 Type 2 diabetes mellitus with diabetic chronic kidney disease: Secondary | ICD-10-CM | POA: Diagnosis not present

## 2020-04-29 ENCOUNTER — Encounter (INDEPENDENT_AMBULATORY_CARE_PROVIDER_SITE_OTHER): Payer: Self-pay | Admitting: *Deleted

## 2020-05-07 DIAGNOSIS — E039 Hypothyroidism, unspecified: Secondary | ICD-10-CM | POA: Diagnosis not present

## 2020-05-07 DIAGNOSIS — E1122 Type 2 diabetes mellitus with diabetic chronic kidney disease: Secondary | ICD-10-CM | POA: Diagnosis not present

## 2020-05-07 DIAGNOSIS — N1831 Chronic kidney disease, stage 3a: Secondary | ICD-10-CM | POA: Diagnosis not present

## 2020-05-07 DIAGNOSIS — I129 Hypertensive chronic kidney disease with stage 1 through stage 4 chronic kidney disease, or unspecified chronic kidney disease: Secondary | ICD-10-CM | POA: Diagnosis not present

## 2020-05-10 ENCOUNTER — Encounter: Payer: Self-pay | Admitting: Cardiology

## 2020-05-10 ENCOUNTER — Ambulatory Visit (INDEPENDENT_AMBULATORY_CARE_PROVIDER_SITE_OTHER): Payer: Medicare Other | Admitting: Cardiology

## 2020-05-10 ENCOUNTER — Other Ambulatory Visit: Payer: Self-pay

## 2020-05-10 VITALS — BP 138/70 | HR 83 | Ht 75.0 in | Wt 233.4 lb

## 2020-05-10 DIAGNOSIS — I350 Nonrheumatic aortic (valve) stenosis: Secondary | ICD-10-CM | POA: Diagnosis not present

## 2020-05-10 DIAGNOSIS — Z951 Presence of aortocoronary bypass graft: Secondary | ICD-10-CM

## 2020-05-10 DIAGNOSIS — R0600 Dyspnea, unspecified: Secondary | ICD-10-CM

## 2020-05-10 DIAGNOSIS — I200 Unstable angina: Secondary | ICD-10-CM | POA: Diagnosis not present

## 2020-05-10 DIAGNOSIS — I10 Essential (primary) hypertension: Secondary | ICD-10-CM

## 2020-05-10 DIAGNOSIS — I2510 Atherosclerotic heart disease of native coronary artery without angina pectoris: Secondary | ICD-10-CM | POA: Diagnosis not present

## 2020-05-10 DIAGNOSIS — R002 Palpitations: Secondary | ICD-10-CM | POA: Diagnosis not present

## 2020-05-10 DIAGNOSIS — R42 Dizziness and giddiness: Secondary | ICD-10-CM | POA: Diagnosis not present

## 2020-05-10 DIAGNOSIS — E785 Hyperlipidemia, unspecified: Secondary | ICD-10-CM | POA: Diagnosis not present

## 2020-05-10 DIAGNOSIS — R0609 Other forms of dyspnea: Secondary | ICD-10-CM

## 2020-05-10 DIAGNOSIS — I050 Rheumatic mitral stenosis: Secondary | ICD-10-CM

## 2020-05-10 NOTE — Patient Instructions (Signed)
Medication Instructions:  NO CHANGES *If you need a refill on your cardiac medications before your next appointment, please call your pharmacy*   Lab Work: NOT NEEDED If you have labs (blood work) drawn today and your tests are completely normal, you will receive your results only by: Marland Kitchen MyChart Message (if you have MyChart) OR . A paper copy in the mail If you have any lab test that is abnormal or we need to change your treatment, we will call you to review the results.   Testing/Procedures: NOT NEEDED    Follow-Up: At [facility], you and your health needs are our priority.  As part of our continuing mission to provide you with exceptional heart care, we have created designated Provider Care Teams.  These Care Teams include your primary Cardiologist (physician) and Advanced Practice Providers (APPs -  Physician Assistants and Nurse Practitioners) who all work together to provide you with the care you need, when you need it.      Your next appointment:   12 month(s)  The format for your next appointment:   In Person  Provider:   Glenetta Hew, MD   Other Instructions

## 2020-05-10 NOTE — Progress Notes (Signed)
Primary Care Provider: Redmond School, MD Cardiologist: Ryan Hew, MD Electrophysiologist: None  Clinic Note: Chief Complaint  Patient presents with  . Follow-up    Echocardiogram results.  . Cardiac Valve Problem    Echo shows moderate aortic and mitral stenosis as well as moderate regurgitation-having no real symptoms  . Coronary Artery Disease    No angina  . Shortness of Breath    Overall things seem to improved since being placed on COPD medications    HPI:    Ryan [name] is a 69 y.o. male with a CV PMH noted below who presents today for delayed 79-monthfollow-up to discuss results of his echocardiogram done in May..  Cardiovascular PMH  (CABG 3: LIMA-LAD, SVG-RCA, Lrad-Ramus) in [id number] Severe LM CAD  Myoview 08/27/2019 - Normal EF. Non-ischemic   Moderate Aortic Stenosis/Deficiency along with Moderate MS byEchocardiogramJune 2020: EF 60-65%, mild concentric LVH. Gr 2 DD. No RWMA. Mild-Mod RV dilation & enlargement. Mild LA dilation. Mod MV Calcification . Mod MS(previously read as mild, but mean gradient is lower than estimated valve area is higher than last check).Mild-Mod AS (Mean Gradient 13.5 mmHg.  Carotid occlusion-occluded right carotid with moderate left carotid stenosis --followed by Dr. DiScot [name] OSA- on CPAP.  H/o NHL (complicated by radiation pneumonitis)    Infiltrating ductal carcinoma of right breast-stage II  GaLAUREN AGUAYOas seen twice in November 2020-initially on the 12th and then on the 24th.  Second visit was in response to visit with vascular surgery indicating an aortic murmur along with some exercise intolerance and worsening dyspnea on exertion.  We ordered a Myoview and an echo.  Echo suggested moderate aortic valve stenosis with moderate regurgitation and Myoview was nonischemic.  Recent Hospitalizations: None  Reviewed  CV studies:    The following studies were reviewed today: (if available, images/films  reviewed: From Epic Chart or Care Everywhere) . Echo 02/2020: Normal LV size and function.  EF 65 to 70% no R WMA.  Mild LVH with basal septum.  GRII DD with elevated LVEDP.  Moderate reduced RV function with mild RV dilation, but normal PA pressures.  Mild LA dilation.  Moderate mitral stenosis, moderate AS & AI.  Mild MR, Mod MS (mean gradient 9 mmHg).  Moderate calcified AS w/ Mod AI -> Mean gradient 17.3 mmHg with peak 32 mm.  Interval History:   Ryan PFENNINGresents here today for routine follow-up to discuss results of his echo.  He really is pretty stable.  He does have some exertional dyspnea but is at his baseline.  He is relatively sedentary.  He does get good rest because of nocturia and RLS.  Does not really have much edema or PND/orthopnea.  He said is actually a little less short of breath now since he has been started on his albuterol and Anoro. He is not really on any medications for angina and is doing fine.  In fact his only cardiac medicine other than aspirin is simvastatin.  CV Review of Symptoms (Summary) Cardiovascular ROS: positive for - dyspnea on exertion, palpitations, shortness of breath and Restless leg.  Exercise intolerance and hot weather.  Really cannot do much at all.  Cannot walk inside without problems. negative for - chest pain, edema, irregular heartbeat, orthopnea, paroxysmal nocturnal dyspnea, rapid heart rate or Syncope/near syncope or TIA/amaurosis fugax.  Claudication.  The patient does not have symptoms concerning for COVID-19 infection (fever, chills, cough, or new shortness of breath).  The patient  is practicing social distancing & Masking.    REVIEWED OF SYSTEMS   Review of Systems  Constitutional: Negative for malaise/fatigue (Is the most energetic, but does not note fatigue.) and weight loss.  HENT: Positive for congestion. Negative for nosebleeds.   Respiratory: Positive for shortness of breath (Baseline). Negative for cough and wheezing.     Cardiovascular: Negative for claudication.  Gastrointestinal: Negative for abdominal pain, blood in stool, constipation and melena.  Genitourinary: Positive for frequency (At least 2 times a night nocturia.). Negative for hematuria.  Musculoskeletal: Positive for joint pain. Negative for falls.       Mild unsteady gait.  Neurological: Positive for dizziness (Sometimes with change in position or turning his head too fast), tingling (He has pedal peripheral neuropathy with pretty significant right leg are less can keep him up at night.), weakness (Generalized) and headaches (Occasional). Negative for focal weakness.  Psychiatric/Behavioral: Positive for memory loss. The patient is not nervous/anxious and does not have insomnia.    I have reviewed and (if needed) personally updated the patient's problem list, medications, allergies, past medical and surgical history, social and family history.   PAST MEDICAL HISTORY   Past Medical History:  Diagnosis Date  . Adie's pupil   . Anemia 07/14/2013  . Borderline diabetes   . CAD (coronary artery disease), native coronary artery 04/09/2013   Dist LM stenosis 70-80% (at trifurcation into LAD, CX, & 2 Ramus branches, 70% mid RCA);; b) Myoview 08/06/14: Low Risk, no Ischemia/infarction  . Depression 1983   "after I was dx'd w/CA; now just comes in spells"  (12/22/2015)  . Essential hypertension   . Exertional shortness of breath   . Hodgkin's lymphoma ([place]) 1983  . Hypercholesterolemia   . Hypothyroidism   . Invasive ductal carcinoma of breast, stage 2 ([place]) 04/27/2011   chemo, mastectomy  . Left carotid artery partial occlusion 04/27/2011   CAROTIC DOPPLER 11/2013: < 40% R ICA stenosis, distal waveforms damped - suggests probably intracranial occlusoin, < 41% LICA, normal vertebrals -- no change  . Mild aortic stenosis by prior echocardiogram 02/2018   By echo June 2020: Mild-Mod AS (Mean Gradient 13.5 mmHg)  . Mild mitral stenosis by prior  echocardiogram 08/06/2014   Most recent echo June 2020: Estimated valve area 2.83 cm (in 2019 was roughly 2 cm).  Mean gradient 6-8 mmHg.  Appears to be stable.  . Personal history of chemotherapy   . Restless leg syndrome   . Right carotid artery occlusion 04/27/2011  . S/P CABG x 3 04/09/2013   LIMA-LAD, fLRAD-RI, SVG-RCA; Echo 10/29/'15:  mild Conc LVH, no WMA, Gr 1 DD, Mod AoV Sclerosis w/ Mod AI, ~Mild-Mod MS   . Stroke [facility])    2007  . Thyroid disease     PAST SURGICAL HISTORY   Past Surgical History:  Procedure Laterality Date  . BREAST BIOPSY Right   . CARDIAC CATHETERIZATION  2014  . CATARACT EXTRACTION W/PHACO Right 06/21/2015   Procedure: CATARACT EXTRACTION PHACO AND INTRAOCULAR LENS PLACEMENT ([place]);  Surgeon: Tonny Branch, MD;  Location: [facility];  Service: Ophthalmology;  Laterality: Right;  CDE: 7.05  . CATARACT EXTRACTION W/PHACO Left 07/01/2015   Procedure: CATARACT EXTRACTION PHACO AND INTRAOCULAR LENS PLACEMENT ([facility]);  Surgeon: Tonny Branch, MD;  Location: [facility];  Service: Ophthalmology;  Laterality: Left;  CDE: 7.49  . COLONOSCOPY [name] 05/06/2015   Procedure: COLONOSCOPY;  Surgeon: Rogene Houston, MD;  Location: [facility];  Service: Endoscopy;  Laterality: [name];  930  . CORONARY ARTERY BYPASS GRAFT [name] 04/08/2013   Procedure: CORONARY ARTERY BYPASS GRAFTING (CABG);  Surgeon: Ivin Poot, MD;  Location: [place];  Service: Open Heart Surgery;  Laterality: [name];  Times 3 using left internal mammary artery; endoscopically harvested right saphenous vein and left radial artery  . INTRAOPERATIVE TRANSESOPHAGEAL ECHOCARDIOGRAM [name] 04/08/2013   Procedure: INTRAOPERATIVE TRANSESOPHAGEAL ECHOCARDIOGRAM;  Surgeon: Ivin Poot, MD;  Location: [place];  Service: Open Heart Surgery;  Laterality: [name];  . LAPAROSCOPIC APPENDECTOMY [name] 12/23/2015   Procedure: APPENDECTOMY LAPAROSCOPIC;  Surgeon: Donnie Mesa, MD;  Location: [place];  Service: General;  Laterality: [name];  . LEFT HEART  CATHETERIZATION WITH CORONARY ANGIOGRAM [name] 04/07/2013   Procedure: LEFT HEART CATHETERIZATION WITH CORONARY ANGIOGRAM;  Surgeon: Leonie Man, MD;  Location[facility] CATH LAB;  Service: Cardiovascular;  Laterality: [name];  . LYMPH NODE BIOPSY Left 1983   neck  . MASTECTOMY Right    w/axillary lymph node dissection  . NM MYOVIEW LTD  08/2019    EF 55 to 60%.  LOW RISK.  No ischemia or infarction noted.  Marland Kitchen NM TREADMILL MYOVIEW LTD  08/06/2014   Exercise 5:07 min, 6.3 METS; symptoms noted or extreme dyspnea and lightheadedness with some chest tightness. No EKG changes. No ischemia or infarction was normal EF.  Marland Kitchen RADIAL ARTERY HARVEST Left 04/08/2013   Procedure: RADIAL ARTERY HARVEST;  Surgeon: Ivin Poot, MD;  Location: [place];  Service: Open Heart Surgery;  Laterality: Left;  . TONSILLECTOMY  1974  . TRANSTHORACIC ECHOCARDIOGRAM  08/06/2014; 02/2018   a) Nl EF 55-60%. Gr 1 DD-high LVEDP. Mod AoV Sclerosis w/o AS. Mild AI; mild MS - mean grad 7 mmHg @ HR 122 bpm.;; b) Initial: EF 55-60%, GRII DD.  HighLAP/LVEDP.  ? Bicuspid AoV -severely Ca2+.  Mild Ao root dilation.  Severe MAC, mild MS (MVA by P1/2 T & continuity equation ~2 cm).  Mod RV dilation w/ mild- mod reduced EF.---> Limited w/Defiinity: No RWMA, Mild AS, Mod MS (~mean gradient 8 mmHg  . TRANSTHORACIC ECHOCARDIOGRAM  02/2020   Normal LV size and function.  EF 65 to 70% no R WMA.  Mild LVH with basal septum.  GRII DD with elevated LVEDP.  Moderate reduced RV function with mild RV dilation, but normal PA pressures.  Mild LA dilation.  Moderate mitral stenosis, moderate AS & AI.  Mild MR, Mod MS (mean gradient 9 mmHg).  Moderate calcified AS w/ Mod AI -> Mean gradient 17.3 mmHg with peak 32 mm.  . TRANSTHORACIC ECHOCARDIOGRAM  03/2019    EF 60-65%, mild concentric LVH. Gr 2 DD. No RWMA. Mild-Mod RV dilation & enlargement. Mild LA dilation. Mod MV Calcification . Mod MS (previously read as mild, but mean gradient is lower than and estimated valve  area is higher than last check).  Mild-Mod AS (Mean Gradient 13.5 mmHg)    MEDICATIONS/ALLERGIES   Current Meds  Medication Sig  . acetaminophen (TYLENOL) 500 MG tablet Take 500 mg by mouth every 6 (six) hours as needed for mild pain.   Marland Kitchen albuterol (PROVENTIL HFA;VENTOLIN HFA) 108 (90 Base) MCG/ACT inhaler Inhale 2 puffs into the lungs every 6 (six) hours as needed for wheezing or shortness of breath.  . ALPRAZolam (XANAX) 1 MG tablet Take 0.5 mg by mouth at bedtime as needed for anxiety.   Jearl Klinefelter ELLIPTA 62.5-25 MCG/INH AEPB   . aspirin EC 81 MG tablet Take 1 tablet (81 mg total) by mouth daily.  Marland Kitchen  Cholecalciferol (VITAMIN D-3 PO) Take 1 tablet by mouth daily.   Marland Kitchen CINNAMON PO Take by mouth.  . citalopram (CELEXA) 20 MG tablet Take 40 mg by mouth every evening.   . Cyanocobalamin (VITAMIN B 12 PO) Take by mouth.  . diphenhydrAMINE (BENADRYL) 25 MG tablet Take 25 mg by mouth daily as needed for allergies.  Marland Kitchen ECHINACEA EXTRACT PO Take by mouth.  . Garlic 102 MG CAPS Take 500 mg by mouth daily.  . Ginkgo Biloba 60 MG CAPS Take 1 capsule by mouth daily.  Marland Kitchen levothyroxine (SYNTHROID, LEVOTHROID) 75 MCG tablet Take 75 mcg by mouth daily.   . Magnesium 250 MG TABS Take 1 tablet by mouth daily.  . nitroGLYCERIN (NITROSTAT) 0.4 MG SL tablet Place 1 tablet (0.4 mg total) under the tongue every 5 (five) minutes as needed for chest pain.  Marland Kitchen OVER THE COUNTER MEDICATION Take 1 tablet by mouth daily. Ultra kidney complex  . rOPINIRole (REQUIP) 4 MG tablet Take 4 mg by mouth 2 (two) times daily. Per pt  . simvastatin (ZOCOR) 80 MG tablet     No Known Allergies  SOCIAL HISTORY/FAMILY HISTORY   Reviewed in Epic:  Pertinent findings: [name]  OBJCTIVE -PE, EKG, labs   Wt Readings from Last 3 Encounters:  05/10/20 233 lb 6.4 oz (105.9 kg)  01/15/20 235 lb (106.6 kg)  12/04/19 236 lb (107 kg)  -- lost from 245 lb last Nov.  Physical Exam: BP 138/70   Pulse 83   Ht _0  (1.905 m)   Wt 233 lb  6.4 oz (105.9 kg)   SpO2 98%   BMI 29.17 kg/m  Physical Exam Constitutional:      General: He is not in acute distress.    Appearance: Normal appearance. He is not ill-appearing.     Comments: Borderline obese but otherwise well-groomed.  Seems actually healthier than previous visits.  HENT:     Head: Normocephalic and atraumatic.     Ears:     Comments: Quite hard of hearing Neck:     Vascular: No carotid bruit.     Comments: No JVD Cardiovascular:     Rate and Rhythm: Normal rate and regular rhythm.     Pulses: Normal pulses.     Heart sounds: Murmur (3/6C-D SEM at RUSB-neck.  1/6 early DM and apex.  Also 1-2/6 HSM at apex.) heard.  No friction rub. No gallop.   Pulmonary:     Effort: Pulmonary effort is normal. No respiratory distress.     Comments: Overall distant breath sounds with mild basal crackles but no rales or rhonchi. Chest:     Chest wall: No tenderness.  Abdominal:     General: Abdomen is flat. Bowel sounds are normal.     Palpations: Abdomen is soft.     Comments: No palpable HSM.  Musculoskeletal:        General: Swelling (Trivial bilateral LE) present. Normal range of motion.     Cervical back: Normal range of motion.  Neurological:     General: No focal deficit present.     Mental Status: He is alert and oriented to person, place, and time.  Psychiatric:        Mood and Affect: Mood normal.        Behavior: Behavior normal.        Thought Content: Thought content normal.        Judgment: Judgment normal.    Adult ECG Report  Rate: 83 ;  Rhythm: normal sinus rhythm and RBBB, LAFB (-52 ).  Otherwise stable;   Narrative Interpretation: Stable EKG.  Recent Labs:    Lab Results  Component Value Date   CHOL 141 11/18/2019   HDL 62 11/18/2019   LDLCALC 65 11/18/2019   TRIG 69 11/18/2019   CHOLHDL 2.3 11/18/2019   Lab Results  Component Value Date   CREATININE 1.27 (H) 01/06/2020   BUN 18 01/06/2020   NA 140 01/06/2020   K 4.4 01/06/2020   CL  107 01/06/2020   CO2 25 01/06/2020   Lab Results  Component Value Date   TSH 3.706 04/02/2013    ASSESSMENT/PLAN    Problem List Items Addressed This Visit    S/P CABG x 3  LIMA-LAD, free LRA to RI, SVG-RCA  - 04/09/13 (Chronic)    Thankfully, he had a negative Myoview with no active anginal symptoms. No plans for further testing. He is not on beta-blocker or ARB for other reasons as previously noted.  He is on aspirin and simvastatin only.      Relevant Orders   EKG 12-Lead (Completed)   Coronary atherosclerosis of native coronary artery - multivessel CAD, status post CABG x3 (LIMA-LAD, Left Radial-RI, SVG-RCA) (Chronic)   Relevant Orders   EKG 12-Lead (Completed)   Exertional dyspnea (Chronic)    Negative Myoview making ischemic etiology less likely.  Is probably combination of COPD and deconditioning.  He does have underlying basilar lung disease as well.  Perhaps, grade 2 diastolic dysfunction is involved, but not a lot because stable although low blood pressures.      Hyperlipidemia with target LDL less than 70 (Chronic)    Lipids checked in February look great.  Continue current dose of simvastatin.  No change.      Relevant Orders   EKG 12-Lead (Completed)   Essential hypertension (Chronic)    No longer treating because of history of orthostatic hypotension.  No further dizziness.  Allowing for maybe some mild permissive hypertension. Avoiding vasodilators such as ACE inhibitor/ARB or amlodipine. No beta-blocker because of fatigue.      Aortic stenosis - Primary (Chronic)    Moderate stenosis.  At this point we can probably recheck an echo in 2 years since findings been stable.  Heard on exam, unless symptoms warrant or murmur is louder, would not recheck of the in 2 years.      Mitral stenosis (Chronic)    Mitral stenosis actually seems to be more pronounced than aortic stenosis. Plan 2-year follow-up echo unless symptoms worsen.      Heart palpitations     Not really having any despite not being on beta-blocker.      Dizzy spells    No longer having the spells since his blood pressures have improved.          COVID-19 Education: The signs and symptoms of COVID-19 were discussed with the patient and how to seek care for testing (follow up with PCP or arrange E-visit).   The importance of social distancing and COVID-19 vaccination was discussed today.  I spent a total of 43mnutes with the patient. >  50% of the time was spent in direct patient consultation.  Additional time spent with chart review  / charting (studies, outside notes, etc): 8 Total Time: 29 min   Current medicines are reviewed at length with the patient today.  (+/- concerns) n/a  Notice: This dictation was prepared with Dragon dictation along with smaller phrase technology. Any transcriptional errors  that result from this process are unintentional and may not be corrected upon review.  Patient Instructions / Medication Changes & Studies & Tests Ordered   Patient Instructions  Medication Instructions:  NO CHANGES *If you need a refill on your cardiac medications before your next appointment, please call your pharmacy*   Lab Work: NOT NEEDED If you have labs (blood work) drawn today and your tests are completely normal, you will receive your results only by: Marland Kitchen MyChart Message (if you have MyChart) OR . A paper copy in the mail If you have any lab test that is abnormal or we need to change your treatment, we will call you to review the results.   Testing/Procedures: NOT NEEDED    Follow-Up: At [facility], you and your health needs are our priority.  As part of our continuing mission to provide you with exceptional heart care, we have created designated Provider Care Teams.  These Care Teams include your primary Cardiologist (physician) and Advanced Practice Providers (APPs -  Physician Assistants and Nurse Practitioners) who all work together to provide  you with the care you need, when you need it.      Your next appointment:   12 month(s)  The format for your next appointment:   In Person  Provider:   Glenetta Hew, MD   Other Instructions     Studies Ordered:   Orders Placed This Encounter  Procedures  . EKG 12-Lead     Ryan [name], M.D., M.S. Interventional Cardiologist   Pager # [phone] Phone # [phone] [address]. [place], [place] [address]   Thank you for choosing Heartcare at [facility]!!

## 2020-05-13 ENCOUNTER — Encounter: Payer: Self-pay | Admitting: Cardiology

## 2020-05-14 ENCOUNTER — Encounter: Payer: Self-pay | Admitting: Cardiology

## 2020-05-14 NOTE — Assessment & Plan Note (Signed)
Lipids checked in February look great.  Continue current dose of simvastatin.  No change.

## 2020-05-14 NOTE — Assessment & Plan Note (Signed)
No longer treating because of history of orthostatic hypotension.  No further dizziness.  Allowing for maybe some mild permissive hypertension. Avoiding vasodilators such as ACE inhibitor/ARB or amlodipine. No beta-blocker because of fatigue.

## 2020-05-14 NOTE — Assessment & Plan Note (Signed)
Mitral stenosis actually seems to be more pronounced than aortic stenosis. Plan 2-year follow-up echo unless symptoms worsen.

## 2020-05-14 NOTE — Assessment & Plan Note (Signed)
Not really having any despite not being on beta-blocker.

## 2020-05-14 NOTE — Assessment & Plan Note (Signed)
Moderate stenosis.  At this point we can probably recheck an echo in 2 years since findings been stable.  Heard on exam, unless symptoms warrant or murmur is louder, would not recheck of the in 2 years.

## 2020-05-14 NOTE — Assessment & Plan Note (Signed)
Negative Myoview making ischemic etiology less likely.  Is probably combination of COPD and deconditioning.  He does have underlying basilar lung disease as well.  Perhaps, grade 2 diastolic dysfunction is involved, but not a lot because stable although low blood pressures.

## 2020-05-14 NOTE — Assessment & Plan Note (Signed)
Thankfully, he had a negative Myoview with no active anginal symptoms. No plans for further testing. He is not on beta-blocker or ARB for other reasons as previously noted.  He is on aspirin and simvastatin only.

## 2020-05-14 NOTE — Assessment & Plan Note (Signed)
No longer having the spells since his blood pressures have improved.

## 2020-07-12 DIAGNOSIS — Z23 Encounter for immunization: Secondary | ICD-10-CM | POA: Diagnosis not present

## 2020-08-10 ENCOUNTER — Other Ambulatory Visit (INDEPENDENT_AMBULATORY_CARE_PROVIDER_SITE_OTHER): Payer: Self-pay | Admitting: *Deleted

## 2020-08-11 ENCOUNTER — Telehealth (INDEPENDENT_AMBULATORY_CARE_PROVIDER_SITE_OTHER): Payer: Self-pay | Admitting: *Deleted

## 2020-08-11 ENCOUNTER — Encounter (INDEPENDENT_AMBULATORY_CARE_PROVIDER_SITE_OTHER): Payer: Self-pay | Admitting: *Deleted

## 2020-08-11 NOTE — Telephone Encounter (Signed)
Patient needs Plenvu (copay card) ° °

## 2020-08-11 NOTE — Telephone Encounter (Signed)
Referring MD/PCP: fusco   Procedure: tcs  Reason/Indication:  Hx polyps  Has patient had this procedure before?  Yes, 2016  If so, when, by whom and where?    Is there a family history of colon cancer?  no  Who?  What age when diagnosed?    Is patient diabetic?   no      Does patient have prosthetic heart valve or mechanical valve?  no  Do you have a pacemaker/defibrillator?  no  Has patient ever had endocarditis/atrial fibrillation? no  Does patient use oxygen? no  Has patient had joint replacement within last 12 months?  no  Is patient constipated or do they take laxatives? no  Does patient have a history of alcohol/drug use?  no  Is patient on blood thinner such as Coumadin, Plavix and/or Aspirin? yes  Medications: asa 81 mg daily, levothyroxine 75 mcg daily, simvastatin 80 mg daily, citalopram 20 mg bid, ropinirole 4 mg 2 tab daily, alprazolam 1 mg daily, vit b12 daily, iron 65 mg daily, stool softener, vit d 3 daily, garlic 0174 mg daily, ginko biloba 120 mg daily, kidney complex bid, ultra liver complex daily, salmon oil 1000cmg daily, folate daily, magnesium 250 mg daily, herbal laxative daily,   Allergies: nkda  Medication Adjustment per Dr Rehman/Dr Jenetta Downer asa 2 days, iron 10 days  Procedure date & time: 09/08/20

## 2020-08-12 MED ORDER — PLENVU 140 G PO SOLR: 1.0000 | Freq: Once | ORAL | 0 refills | Status: AC

## 2020-08-17 ENCOUNTER — Other Ambulatory Visit (INDEPENDENT_AMBULATORY_CARE_PROVIDER_SITE_OTHER): Payer: Self-pay

## 2020-08-17 DIAGNOSIS — Z86010 Personal history of colonic polyps: Secondary | ICD-10-CM

## 2020-08-24 ENCOUNTER — Other Ambulatory Visit: Payer: Self-pay | Admitting: *Deleted

## 2020-08-24 DIAGNOSIS — I6523 Occlusion and stenosis of bilateral carotid arteries: Secondary | ICD-10-CM

## 2020-09-06 ENCOUNTER — Ambulatory Visit (HOSPITAL_COMMUNITY)
Admission: RE | Admit: 2020-09-06 | Discharge: 2020-09-06 | Disposition: A | Discharge status: Home / Self Care | Payer: Medicare Other | Source: Ambulatory Visit | Attending: Surgery | Admitting: Surgery

## 2020-09-06 ENCOUNTER — Ambulatory Visit (INDEPENDENT_AMBULATORY_CARE_PROVIDER_SITE_OTHER): Payer: Medicare Other | Admitting: Physician Assistant

## 2020-09-06 ENCOUNTER — Other Ambulatory Visit: Payer: Self-pay

## 2020-09-06 VITALS — BP 147/72 | HR 89 | Temp 98.6°F | Resp 20 | Ht 75.0 in | Wt 234.9 lb

## 2020-09-06 DIAGNOSIS — I6523 Occlusion and stenosis of bilateral carotid arteries: Secondary | ICD-10-CM | POA: Diagnosis present

## 2020-09-06 DIAGNOSIS — I200 Unstable angina: Secondary | ICD-10-CM

## 2020-09-06 NOTE — Progress Notes (Signed)
Office Note     CC:  follow up  Requesting Provider:  Redmond School, MD  HPI: Ryan [name] is a 69 y.o. (04-15-1951) male who presents for routine follow-up of carotid artery stenosis.  The patient has a known right internal carotid artery occlusion.  He suffered a right hemispheric stroke in 2007 that was associated with left-sided weakness.  He denies any residual weakness, no new symptoms of amaurosis fugax, extremity weakness, numbness or paralysis.  He denies slurred speech or facial drooping.  He is compliant with aspirin and statin. Diabetic: borderline,  A1C not on file Tobacco use: former smoker, quit in 1983   Past Medical History:  Diagnosis Date  . Adie's pupil   . Anemia 07/14/2013  . Borderline diabetes   . CAD (coronary artery disease), native coronary artery 04/09/2013   Dist LM stenosis 70-80% (at trifurcation into LAD, CX, & 2 Ramus branches, 70% mid RCA);; b) Myoview 08/06/14: Low Risk, no Ischemia/infarction  . Depression 1983   "after I was dx'd w/CA; now just comes in spells"  (12/22/2015)  . Essential hypertension   . Exertional shortness of breath   . Hodgkin's lymphoma ([place]) 1983  . Hypercholesterolemia   . Hypothyroidism   . Invasive ductal carcinoma of breast, stage 2 ([place]) 04/27/2011   chemo, mastectomy  . Left carotid artery partial occlusion 04/27/2011   CAROTIC DOPPLER 11/2013: < 40% R ICA stenosis, distal waveforms damped - suggests probably intracranial occlusoin, < 16% LICA, normal vertebrals -- no change  . Mild aortic stenosis by prior echocardiogram 02/2018   By echo June 2020: Mild-Mod AS (Mean Gradient 13.5 mmHg)  . Mild mitral stenosis by prior echocardiogram 08/06/2014   Most recent echo June 2020: Estimated valve area 2.83 cm (in 2019 was roughly 2 cm).  Mean gradient 6-8 mmHg.  Appears to be stable.  . Personal history of chemotherapy   . Restless leg syndrome   . Right carotid artery occlusion 04/27/2011  . S/P CABG x 3 04/09/2013    LIMA-LAD, fLRAD-RI, SVG-RCA; Echo 10/29/'15:  mild Conc LVH, no WMA, Gr 1 DD, Mod AoV Sclerosis w/ Mod AI, ~Mild-Mod MS   . Stroke [facility])    2007  . Thyroid disease     Past Surgical History:  Procedure Laterality Date  . BREAST BIOPSY Right   . CARDIAC CATHETERIZATION  2014  . CATARACT EXTRACTION W/PHACO Right 06/21/2015   Procedure: CATARACT EXTRACTION PHACO AND INTRAOCULAR LENS PLACEMENT ([place]);  Surgeon: Tonny Branch, MD;  Location: [facility];  Service: Ophthalmology;  Laterality: Right;  CDE: 7.05  . CATARACT EXTRACTION W/PHACO Left 07/01/2015   Procedure: CATARACT EXTRACTION PHACO AND INTRAOCULAR LENS PLACEMENT (I[facility]);  Surgeon: Tonny Branch, MD;  Location: [facility];  Service: Ophthalmology;  Laterality: Left;  CDE: 7.49  . COLONOSCOPY [name] 05/06/2015   Procedure: COLONOSCOPY;  Surgeon: Rogene Houston, MD;  Location: [facility];  Service: Endoscopy;  Laterality: [name];  930  . CORONARY ARTERY BYPASS GRAFT [name] 04/08/2013   Procedure: CORONARY ARTERY BYPASS GRAFTING (CABG);  Surgeon: Ivin Poot, MD;  Location: [place];  Service: Open Heart Surgery;  Laterality: [name];  Times 3 using left internal mammary artery; endoscopically harvested right saphenous vein and left radial artery  . INTRAOPERATIVE TRANSESOPHAGEAL ECHOCARDIOGRAM [name] 04/08/2013   Procedure: INTRAOPERATIVE TRANSESOPHAGEAL ECHOCARDIOGRAM;  Surgeon: Ivin Poot, MD;  Location: [place];  Service: Open Heart Surgery;  Laterality: [name];  . LAPAROSCOPIC APPENDECTOMY [name] 12/23/2015   Procedure: APPENDECTOMY LAPAROSCOPIC;  Surgeon: Donnie Mesa, MD;  Location: [place];  Service: General;  Laterality: [name];  . LEFT HEART CATHETERIZATION WITH CORONARY ANGIOGRAM [name] 04/07/2013   Procedure: LEFT HEART CATHETERIZATION WITH CORONARY ANGIOGRAM;  Surgeon: Leonie Man, MD;  Location: [facility] CATH LAB;  Service: Cardiovascular;  Laterality: [name];  . LYMPH NODE BIOPSY Left 1983   neck  . MASTECTOMY Right    w/axillary lymph node dissection  . NM MYOVIEW LTD   08/2019    EF 55 to 60%.  LOW RISK.  No ischemia or infarction noted.  Marland Kitchen NM TREADMILL MYOVIEW LTD  08/06/2014   Exercise 5:07 min, 6.3 METS; symptoms noted or extreme dyspnea and lightheadedness with some chest tightness. No EKG changes. No ischemia or infarction was normal EF.  Marland Kitchen RADIAL ARTERY HARVEST Left 04/08/2013   Procedure: RADIAL ARTERY HARVEST;  Surgeon: Ivin Poot, MD;  Location: [place];  Service: Open Heart Surgery;  Laterality: Left;  . TONSILLECTOMY  1974  . TRANSTHORACIC ECHOCARDIOGRAM  08/06/2014; 02/2018   a) Nl EF 55-60%. Gr 1 DD-high LVEDP. Mod AoV Sclerosis w/o AS. Mild AI; mild MS - mean grad 7 mmHg @ HR 122 bpm.;; b) Initial: EF 55-60%, GRII DD.  HighLAP/LVEDP.  ? Bicuspid AoV -severely Ca2+.  Mild Ao root dilation.  Severe MAC, mild MS (MVA by P1/2 T & continuity equation ~2 cm).  Mod RV dilation w/ mild- mod reduced EF.---> Limited w/Defiinity: No RWMA, Mild AS, Mod MS (~mean gradient 8 mmHg  . TRANSTHORACIC ECHOCARDIOGRAM  02/2020   Normal LV size and function.  EF 65 to 70% no R WMA.  Mild LVH with basal septum.  GRII DD with elevated LVEDP.  Moderate reduced RV function with mild RV dilation, but normal PA pressures.  Mild LA dilation.  Moderate mitral stenosis, moderate AS & AI.  Mild MR, Mod MS (mean gradient 9 mmHg).  Moderate calcified AS w/ Mod AI -> Mean gradient 17.3 mmHg with peak 32 mm.  . TRANSTHORACIC ECHOCARDIOGRAM  03/2019    EF 60-65%, mild concentric LVH. Gr 2 DD. No RWMA. Mild-Mod RV dilation & enlargement. Mild LA dilation. Mod MV Calcification . Mod MS (previously read as mild, but mean gradient is lower than and estimated valve area is higher than last check).  Mild-Mod AS (Mean Gradient 13.5 mmHg)    Social History   Socioeconomic History  . Marital status: Married    Spouse name: Not on file  . Number of children: 2  . Years of education: Not on file  . Highest education level: Not on file  Occupational History  . Not on file  Tobacco Use  .  Smoking status: Former Smoker    Packs/day: 1.00    Years: 15.00    Pack years: 15.00    Types: Cigarettes    Quit date: 06/09/1982    Years since quitting: 38.2  . Smokeless tobacco: Never Used  Vaping Use  . Vaping Use: Never used  Substance and Sexual Activity  . Alcohol use: No  . Drug use: No  . Sexual activity: Never  Other Topics Concern  . Not on file  Social History Narrative   Married. Former smoker who quit in 1983.   No routine exercise - gained ~40lb post CABG.   Social Determinants of Health   Financial Resource Strain:   . Difficulty of Paying Living Expenses: Not on file  Food Insecurity:   . Worried About Charity fundraiser in the Last Year: Not  on file  . Ran Out of Food in the Last Year: Not on file  Transportation Needs:   . Lack of Transportation (Medical): Not on file  . Lack of Transportation (Non-Medical): Not on file  Physical Activity:   . Days of Exercise per Week: Not on file  . Minutes of Exercise per Session: Not on file  Stress:   . Feeling of Stress : Not on file  Social Connections:   . Frequency of Communication with Friends and Family: Not on file  . Frequency of Social Gatherings with Friends and Family: Not on file  . Attends Religious Services: Not on file  . Active Member of Clubs or Organizations: Not on file  . Attends Archivist Meetings: Not on file  . Marital Status: Not on file  Intimate Partner Violence:   . Fear of Current or Ex-Partner: Not on file  . Emotionally Abused: Not on file  . Physically Abused: Not on file  . Sexually Abused: Not on file   Family History  Problem Relation Age of Onset  . Alzheimer's disease Mother   . Stroke Mother   . Hypertension Mother   . Hyperlipidemia Mother   . Diabetes Mother   . Hypertension Father   . Diabetes Paternal Grandmother   . Diabetes Paternal Grandfather   . Breast cancer Paternal Aunt   . Ovarian cancer Paternal Aunt     Current Outpatient Medications    Medication Sig Dispense Refill  . acetaminophen (TYLENOL) 500 MG tablet Take 500 mg by mouth every 6 (six) hours as needed for mild pain.     Marland Kitchen albuterol (PROVENTIL HFA;VENTOLIN HFA) 108 (90 Base) MCG/ACT inhaler Inhale 2 puffs into the lungs every 6 (six) hours as needed for wheezing or shortness of breath.    Marland Kitchen albuterol (PROVENTIL) 2 MG tablet Take 2 mg by mouth daily.    Marland Kitchen ALPRAZolam (XANAX) 1 MG tablet Take 0.5-1 mg by mouth at bedtime.     . Artificial Tear Solution (SOOTHE XP OP) Place 1 drop into both eyes daily as needed (dry eyes).    Marland Kitchen aspirin EC 81 MG tablet Take 1 tablet (81 mg total) by mouth daily. 90 tablet 3  . Cholecalciferol (VITAMIN D3) 250 MCG (10000 UT) capsule Take 10,000 Units by mouth daily.    . citalopram (CELEXA) 20 MG tablet Take 40 mg by mouth every evening.     . diphenhydrAMINE (BENADRYL) 25 MG tablet Take 25 mg by mouth daily as needed for allergies.    . Ferrous Gluconate (IRON 27 PO) Take 27 mg by mouth daily.    . Garlic 1941 MG CAPS Take 1,000 mg by mouth daily.    . Ginkgo Biloba 120 MG CAPS Take 120 mg by mouth daily.    Marland Kitchen levothyroxine (SYNTHROID, LEVOTHROID) 75 MCG tablet Take 75 mcg by mouth daily before breakfast.     . Magnesium 500 MG CAPS Take 1,000 mg by mouth daily.    . nitroGLYCERIN (NITROSTAT) 0.4 MG SL tablet Place 1 tablet (0.4 mg total) under the tongue every 5 (five) minutes as needed for chest pain. 25 tablet 12  . Omega-3 Fatty Acids (SALMON OIL-1000 PO) Take 1,000 mg by mouth daily.    Marland Kitchen OVER THE COUNTER MEDICATION Take 1 tablet by mouth daily. Ultra kidney complex    . OVER THE COUNTER MEDICATION Take 1 tablet by mouth daily. Liver 10 complex otc supplement    . rOPINIRole (REQUIP) 4 MG tablet  Take 4 mg by mouth See admin instructions. Take 4 mg every night, take an additional 4 mg in the morning 3 times weekly    . simvastatin (ZOCOR) 80 MG tablet Take 80 mg by mouth every evening.     . vitamin B-12 (CYANOCOBALAMIN) 500 MCG tablet  Take 500 mcg by mouth daily.     No current facility-administered medications for this visit.    No Known Allergies   REVIEW OF SYSTEMS:   _0  denotes positive finding, _1  denotes negative finding Cardiac  Comments:  Chest pain or chest pressure:    Shortness of breath upon exertion:    Short of breath when lying flat:    Irregular heart rhythm:        Vascular    Pain in calf, thigh, or hip brought on by ambulation:    Pain in feet at night that wakes you up from your sleep:     Blood clot in your veins:    Leg swelling:         Pulmonary    Oxygen at home:    Productive cough:     Wheezing:         Neurologic    Sudden weakness in arms or legs:     Sudden numbness in arms or legs:     Sudden onset of difficulty speaking or slurred speech:    Temporary loss of vision in one eye:     Problems with dizziness:         Gastrointestinal    Blood in stool:     Vomited blood:         Genitourinary    Burning when urinating:     Blood in urine:        Psychiatric    Major depression:         Hematologic    Bleeding problems:    Problems with blood clotting too easily:        Skin    Rashes or ulcers:        Constitutional    Fever or chills:      PHYSICAL EXAMINATION:  Vitals:   09/06/20 1214 09/06/20 1217  BP: 137/79 (!) 147/72  Pulse: 89   Resp: 20   Temp: 98.6 F (37 C)   TempSrc: Temporal   SpO2: 97%   Weight: 234 lb 14.4 oz (106.5 kg)   Height: _2  (1.905 m)     General:  WDWN in NAD; vital signs documented above Gait: Not observed HENT: WNL, normocephalic Pulmonary: normal non-labored breathing , without Rales, rhonchi,  wheezing Cardiac: regular HR, with  Murmurs without carotid bruit Abdomen: soft, NT, no masses Skin: without rashes Vascular Exam/Pulses: 2+ radial pulses Extremities: without ischemic changes, without Gangrene , without cellulitis; without open wounds;  Musculoskeletal: no muscle wasting or atrophy  Neurologic: A&O  X 3;  No focal weakness or paresthesias are detected.  Tongue is midline.  Face is symmetrical. Psychiatric:  The pt has Normal affect.   Non-Invasive Vascular Imaging:   09/06/2020 Right Carotid: High resistant flow with dampening at the distal ICA  consistent with distal occlusion or obstruction. Trickle flow noted in  the proximal ICA.   Left Carotid: Velocities in the left ICA are consistent with a 1-39%  stenosis.   Vertebrals: Bilateral vertebral arteries demonstrate antegrade flow.   Subclavians: Normal flow hemodynamics were seen in bilateral subclavian arteries.     ASSESSMENT/PLAN:: 69 y.o. male here for  follow up for carotid artery stenosis.  His duplex ultrasound today reveals decrease in estimation of left carotid artery stenosis as compared to 1 year ago in October.  This is consistent with his ultrasound dated January 16, 2018.  The patient is without symptoms.  We reviewed signs and symptoms of stroke/TIA and advised him to seek immediate medical attention should these occur.  Continue statin and aspirin.  Follow-up in 1 year with carotid duplex  Barbie Banner, PA-C [facility] [phone]  Clinic MD:   Trula Slade

## 2020-09-06 NOTE — Patient Instructions (Signed)
Ryan [name]  09/06/2020     @PREFPERIOPPHARMACY @   Your procedure is scheduled on  09/08/2020.  Report to [name] at  [place].  Call this number if you have problems the morning of surgery:  [phone]   Remember:  Follow the diet and prep instructions given to you by the office.                        Take these medicines the morning of surgery with A SIP OF WATER  Proventil, levothyroxine, requip. Use your inhaler before you come.    Do not wear jewelry, make-up or nail polish.  Do not wear lotions, powders, or perfumes. Please wear deodorant and brush your teeth.  Do not shave 48 hours prior to surgery.  Men may shave face and neck.  Do not bring valuables to the hospital.  [facility] is not responsible for any belongings or valuables.  Contacts, dentures or bridgework may not be worn into surgery.  Leave your suitcase in the car.  After surgery it may be brought to your room.  For patients admitted to the hospital, discharge time will be determined by your treatment team.  Patients discharged the day of surgery will not be allowed to drive home.   Name and phone number of your driver:   family Special instructions:  DO NOT smoke the morning of your procedure.  Please read over the following fact sheets that you were given. Anesthesia Post-op Instructions and Care and Recovery After Surgery       Colonoscopy, Adult, Care After This sheet gives you information about how to care for yourself after your procedure. Your health care provider may also give you more specific instructions. If you have problems or questions, contact your health care provider. What can I expect after the procedure? After the procedure, it is common to have:  A small amount of blood in your stool for 24 hours after the procedure.  Some gas.  Mild cramping or bloating of your abdomen. Follow these instructions at home: Eating and drinking   Drink enough fluid to keep  your urine pale yellow.  Follow instructions from your health care provider about eating or drinking restrictions.  Resume your normal diet as instructed by your health care provider. Avoid heavy or fried foods that are hard to digest. Activity  Rest as told by your health care provider.  Avoid sitting for a long time without moving. Get up to take short walks every 1-2 hours. This is important to improve blood flow and breathing. Ask for help if you feel weak or unsteady.  Return to your normal activities as told by your health care provider. Ask your health care provider what activities are safe for you. Managing cramping and bloating   Try walking around when you have cramps or feel bloated.  Apply heat to your abdomen as told by your health care provider. Use the heat source that your health care provider recommends, such as a moist heat pack or a heating pad. ? Place a towel between your skin and the heat source. ? Leave the heat on for 20-30 minutes. ? Remove the heat if your skin turns bright red. This is especially important if you are unable to feel pain, heat, or cold. You may have a greater risk of getting burned. General instructions  For the first 24 hours after the procedure: ? Do not  drive or use machinery. ? Do not sign important documents. ? Do not drink alcohol. ? Do your regular daily activities at a slower pace than normal. ? Eat soft foods that are easy to digest.  Take over-the-counter and prescription medicines only as told by your health care provider.  Keep all follow-up visits as told by your health care provider. This is important. Contact a health care provider if:  You have blood in your stool 2-3 days after the procedure. Get help right away if you have:  More than a small spotting of blood in your stool.  Large blood clots in your stool.  Swelling of your abdomen.  Nausea or vomiting.  A fever.  Increasing pain in your abdomen that is not  relieved with medicine. Summary  After the procedure, it is common to have a small amount of blood in your stool. You may also have mild cramping and bloating of your abdomen.  For the first 24 hours after the procedure, do not drive or use machinery, sign important documents, or drink alcohol.  Get help right away if you have a lot of blood in your stool, nausea or vomiting, a fever, or increased pain in your abdomen. This information is not intended to replace advice given to you by your health care provider. Make sure you discuss any questions you have with your health care provider. Document Revised: 04/21/2019 Document Reviewed: 04/21/2019 Elsevier Patient Education  [place] After These instructions provide you with information about caring for yourself after your procedure. Your health care provider may also give you more specific instructions. Your treatment has been planned according to current medical practices, but problems sometimes occur. Call your health care provider if you have any problems or questions after your procedure. What can I expect after the procedure? After your procedure, you may:  Feel sleepy for several hours.  Feel clumsy and have poor balance for several hours.  Feel forgetful about what happened after the procedure.  Have poor judgment for several hours.  Feel nauseous or vomit.  Have a sore throat if you had a breathing tube during the procedure. Follow these instructions at home: For at least 24 hours after the procedure:      Have a responsible adult stay with you. It is important to have someone help care for you until you are awake and alert.  Rest as needed.  Do not: ? Participate in activities in which you could fall or become injured. ? Drive. ? Use heavy machinery. ? Drink alcohol. ? Take sleeping pills or medicines that cause drowsiness. ? Make important decisions or sign legal  documents. ? Take care of children on your own. Eating and drinking  Follow the diet that is recommended by your health care provider.  If you vomit, drink water, juice, or soup when you can drink without vomiting.  Make sure you have little or no nausea before eating solid foods. General instructions  Take over-the-counter and prescription medicines only as told by your health care provider.  If you have sleep apnea, surgery and certain medicines can increase your risk for breathing problems. Follow instructions from your health care provider about wearing your sleep device: ? Anytime you are sleeping, including during daytime naps. ? While taking prescription pain medicines, sleeping medicines, or medicines that make you drowsy.  If you smoke, do not smoke without supervision.  Keep all follow-up visits as told by your health care provider. This  is important. Contact a health care provider if:  You keep feeling nauseous or you keep vomiting.  You feel light-headed.  You develop a rash.  You have a fever. Get help right away if:  You have trouble breathing. Summary  For several hours after your procedure, you may feel sleepy and have poor judgment.  Have a responsible adult stay with you for at least 24 hours or until you are awake and alert. This information is not intended to replace advice given to you by your health care provider. Make sure you discuss any questions you have with your health care provider. Document Revised: 12/24/2017 Document Reviewed: 01/16/2016 Elsevier Patient Education  [place].

## 2020-09-07 ENCOUNTER — Other Ambulatory Visit: Payer: Self-pay

## 2020-09-07 ENCOUNTER — Other Ambulatory Visit (HOSPITAL_COMMUNITY)
Admission: RE | Admit: 2020-09-07 | Discharge: 2020-09-07 | Disposition: A | Discharge status: Home / Self Care | Payer: Medicare Other | Source: Ambulatory Visit | Attending: Internal Medicine | Admitting: Internal Medicine

## 2020-09-07 ENCOUNTER — Encounter (HOSPITAL_COMMUNITY)
Admission: RE | Admit: 2020-09-07 | Discharge: 2020-09-07 | Disposition: A | Discharge status: Home / Self Care | Payer: Medicare Other | Source: Ambulatory Visit | Attending: Internal Medicine | Admitting: Internal Medicine

## 2020-09-07 ENCOUNTER — Encounter (HOSPITAL_COMMUNITY): Payer: Self-pay

## 2020-09-07 DIAGNOSIS — Z20822 Contact with and (suspected) exposure to covid-19: Secondary | ICD-10-CM | POA: Diagnosis not present

## 2020-09-07 DIAGNOSIS — Z01818 Encounter for other preprocedural examination: Secondary | ICD-10-CM | POA: Insufficient documentation

## 2020-09-07 DIAGNOSIS — Z86010 Personal history of colonic polyps: Secondary | ICD-10-CM | POA: Diagnosis not present

## 2020-09-07 HISTORY — DX: Sleep apnea, unspecified: G47.30

## 2020-09-07 LAB — BASIC METABOLIC PANEL
Anion gap: 9 (ref 5–15)
BUN: 18 mg/dL (ref 8–23)
CO2: 27 mmol/L (ref 22–32)
Calcium: 9.2 mg/dL (ref 8.9–10.3)
Chloride: 102 mmol/L (ref 98–111)
Creatinine, Ser: 1.36 mg/dL — ABNORMAL HIGH (ref 0.61–1.24)
GFR, Estimated: 56 mL/min — ABNORMAL LOW (ref >60)
Glucose, Bld: 123 mg/dL — ABNORMAL HIGH (ref 70–99)
Potassium: 4.6 mmol/L (ref 3.5–5.1)
Sodium: 138 mmol/L (ref 135–145)

## 2020-09-07 LAB — CBC WITH DIFFERENTIAL/PLATELET
Abs Immature Granulocytes: 0.03 K/uL (ref 0.00–0.07)
Basophils Absolute: 0.1 K/uL (ref 0.0–0.1)
Basophils Relative: 1 %
Eosinophils Absolute: 0.3 K/uL (ref 0.0–0.5)
Eosinophils Relative: 4 %
HCT: 45.4 % (ref 39.0–52.0)
Hemoglobin: 14.5 g/dL (ref 13.0–17.0)
Immature Granulocytes: 0 %
Lymphocytes Relative: 18 %
Lymphs Abs: 1.4 K/uL (ref 0.7–4.0)
MCH: 30.3 pg (ref 26.0–34.0)
MCHC: 31.9 g/dL (ref 30.0–36.0)
MCV: 94.8 fL (ref 80.0–100.0)
Monocytes Absolute: 0.8 K/uL (ref 0.1–1.0)
Monocytes Relative: 10 %
Neutro Abs: 5.1 K/uL (ref 1.7–7.7)
Neutrophils Relative %: 67 %
Platelets: 224 K/uL (ref 150–400)
RBC: 4.79 MIL/uL (ref 4.22–5.81)
RDW: 13.2 % (ref 11.5–15.5)
WBC: 7.7 K/uL (ref 4.0–10.5)
nRBC: 0 % (ref 0.0–0.2)

## 2020-09-07 LAB — SARS CORONAVIRUS 2 (TAT 6-24 HRS): SARS Coronavirus 2: NEGATIVE

## 2020-09-08 ENCOUNTER — Encounter (HOSPITAL_COMMUNITY)
Admission: RE | Disposition: A | Discharge status: Home / Self Care | Payer: Self-pay | Source: Home / Self Care | Attending: Internal Medicine

## 2020-09-08 ENCOUNTER — Ambulatory Visit (HOSPITAL_COMMUNITY): Payer: Medicare Other | Admitting: Certified Registered"

## 2020-09-08 ENCOUNTER — Other Ambulatory Visit: Payer: Self-pay

## 2020-09-08 ENCOUNTER — Ambulatory Visit (HOSPITAL_COMMUNITY)
Admission: RE | Admit: 2020-09-08 | Discharge: 2020-09-08 | Disposition: A | Discharge status: Home / Self Care | Payer: Medicare Other | Attending: Internal Medicine | Admitting: Internal Medicine

## 2020-09-08 DIAGNOSIS — Z79899 Other long term (current) drug therapy: Secondary | ICD-10-CM | POA: Diagnosis not present

## 2020-09-08 DIAGNOSIS — Z951 Presence of aortocoronary bypass graft: Secondary | ICD-10-CM | POA: Insufficient documentation

## 2020-09-08 DIAGNOSIS — K6389 Other specified diseases of intestine: Secondary | ICD-10-CM | POA: Diagnosis not present

## 2020-09-08 DIAGNOSIS — K635 Polyp of colon: Secondary | ICD-10-CM | POA: Diagnosis not present

## 2020-09-08 DIAGNOSIS — Z1211 Encounter for screening for malignant neoplasm of colon: Secondary | ICD-10-CM | POA: Diagnosis not present

## 2020-09-08 DIAGNOSIS — K5730 Diverticulosis of large intestine without perforation or abscess without bleeding: Secondary | ICD-10-CM | POA: Diagnosis not present

## 2020-09-08 DIAGNOSIS — D122 Benign neoplasm of ascending colon: Secondary | ICD-10-CM | POA: Diagnosis not present

## 2020-09-08 DIAGNOSIS — K644 Residual hemorrhoidal skin tags: Secondary | ICD-10-CM | POA: Insufficient documentation

## 2020-09-08 DIAGNOSIS — Z79890 Hormone replacement therapy (postmenopausal): Secondary | ICD-10-CM | POA: Insufficient documentation

## 2020-09-08 DIAGNOSIS — Z09 Encounter for follow-up examination after completed treatment for conditions other than malignant neoplasm: Secondary | ICD-10-CM | POA: Diagnosis not present

## 2020-09-08 DIAGNOSIS — Z7982 Long term (current) use of aspirin: Secondary | ICD-10-CM | POA: Insufficient documentation

## 2020-09-08 DIAGNOSIS — E039 Hypothyroidism, unspecified: Secondary | ICD-10-CM | POA: Diagnosis not present

## 2020-09-08 DIAGNOSIS — Z86010 Personal history of colonic polyps: Secondary | ICD-10-CM | POA: Diagnosis not present

## 2020-09-08 DIAGNOSIS — D125 Benign neoplasm of sigmoid colon: Secondary | ICD-10-CM | POA: Diagnosis not present

## 2020-09-08 DIAGNOSIS — Z87891 Personal history of nicotine dependence: Secondary | ICD-10-CM | POA: Insufficient documentation

## 2020-09-08 DIAGNOSIS — I10 Essential (primary) hypertension: Secondary | ICD-10-CM | POA: Diagnosis not present

## 2020-09-08 HISTORY — PX: COLONOSCOPY WITH PROPOFOL: SHX5780

## 2020-09-08 HISTORY — PX: BIOPSY: SHX5522

## 2020-09-08 HISTORY — PX: POLYPECTOMY: SHX5525

## 2020-09-08 SURGERY — COLONOSCOPY WITH PROPOFOL
Anesthesia: General

## 2020-09-08 MED ORDER — FENTANYL CITRATE (PF) 100 MCG/2ML IJ SOLN
INTRAMUSCULAR | Status: DC | PRN
Start: 2020-09-08 — End: 2020-09-08
  Administered 2020-09-08: 50 ug via INTRAVENOUS

## 2020-09-08 MED ORDER — LIDOCAINE HCL (CARDIAC) PF 100 MG/5ML IV SOSY
PREFILLED_SYRINGE | INTRAVENOUS | Status: DC | PRN
  Administered 2020-09-08: 50 mg via INTRAVENOUS

## 2020-09-08 MED ORDER — PROPOFOL 10 MG/ML IV BOLUS
INTRAVENOUS | Status: DC | PRN
  Administered 2020-09-08: 50 mg via INTRAVENOUS
  Administered 2020-09-08: 20 mg via INTRAVENOUS
  Administered 2020-09-08: 40 mg via INTRAVENOUS
  Administered 2020-09-08: 30 mg via INTRAVENOUS

## 2020-09-08 MED ORDER — CHLORHEXIDINE GLUCONATE CLOTH 2 % EX PADS: 6.0000 | MEDICATED_PAD | Freq: Once | CUTANEOUS | Status: DC

## 2020-09-08 MED ORDER — PROPOFOL 500 MG/50ML IV EMUL
INTRAVENOUS | Status: DC | PRN
  Administered 2020-09-08: 100 ug/kg/min via INTRAVENOUS

## 2020-09-08 MED ORDER — FENTANYL CITRATE (PF) 100 MCG/2ML IJ SOLN
INTRAMUSCULAR | Status: AC
  Filled 2020-09-08: qty 2

## 2020-09-08 MED ORDER — PHENYLEPHRINE 40 MCG/ML (10ML) SYRINGE FOR IV PUSH (FOR BLOOD PRESSURE SUPPORT)
PREFILLED_SYRINGE | INTRAVENOUS | Status: AC
  Filled 2020-09-08: qty 10

## 2020-09-08 MED ORDER — LACTATED RINGERS IV SOLN: INTRAVENOUS | Status: DC | PRN

## 2020-09-08 MED ORDER — PHENYLEPHRINE 40 MCG/ML (10ML) SYRINGE FOR IV PUSH (FOR BLOOD PRESSURE SUPPORT)
PREFILLED_SYRINGE | INTRAVENOUS | Status: DC | PRN
  Administered 2020-09-08 (×4): 80 ug via INTRAVENOUS

## 2020-09-08 NOTE — H&P (Signed)
Ryan [name] is an 69 y.o. male.   Chief Complaint: Patient is here for colonoscopy. HPI: Patient is 69 year old Caucasian male with multiple medical problems who also has a history of colonic adenoma and is here for surveillance colonoscopy.  His last exam was in July 2016 with removal of 2 polyps and one was a tubular adenoma.  Patient states he has been prone to constipation.  He has been taking Colace.  However for the last 4 weeks or so he has had loose stools 2-3 times per day.  He has not made any changes in his diet.  He has not taken any antibiotics.  He has noted blood on tissue.  He says he has hemorrhoids.  He also noted some blood when he finished prep last evening.  His appetite is good and his weight has been stable. Family history is negative for CRC or inflammatory bowel disease.  Past Medical History:  Diagnosis Date  . Adie's pupil   . Anemia 07/14/2013  . Borderline diabetes   . CAD (coronary artery disease), native coronary artery 04/09/2013   Dist LM stenosis 70-80% (at trifurcation into LAD, CX, & 2 Ramus branches, 70% mid RCA);; b) Myoview 08/06/14: Low Risk, no Ischemia/infarction  . Depression 1983   "after I was dx'd w/CA; now just comes in spells"  (12/22/2015)  . Essential hypertension   . Exertional shortness of breath   . Hodgkin's lymphoma ([place]) 1983  . Hypercholesterolemia   . Hypothyroidism   . Invasive ductal carcinoma of breast, stage 2 ([place]) 04/27/2011   chemo, mastectomy  . Left carotid artery partial occlusion 04/27/2011   CAROTIC DOPPLER 11/2013: < 40% R ICA stenosis, distal waveforms damped - suggests probably intracranial occlusoin, < 41% LICA, normal vertebrals -- no change  . Mild aortic stenosis by prior echocardiogram 02/2018   By echo June 2020: Mild-Mod AS (Mean Gradient 13.5 mmHg)  . Mild mitral stenosis by prior echocardiogram 08/06/2014   Most recent echo June 2020: Estimated valve area 2.83 cm (in 2019 was roughly 2 cm).  Mean gradient 6-8  mmHg.  Appears to be stable.  . Personal history of chemotherapy   . Restless leg syndrome   . Right carotid artery occlusion 04/27/2011  . S/P CABG x 3 04/09/2013   LIMA-LAD, fLRAD-RI, SVG-RCA; Echo 10/29/'15:  mild Conc LVH, no WMA, Gr 1 DD, Mod AoV Sclerosis w/ Mod AI, ~Mild-Mod MS   . Sleep apnea   . Stroke [facility])    2007  . Thyroid disease     Past Surgical History:  Procedure Laterality Date  . BREAST BIOPSY Right   . CARDIAC CATHETERIZATION  2014  . CATARACT EXTRACTION W/PHACO Right 06/21/2015   Procedure: CATARACT EXTRACTION PHACO AND INTRAOCULAR LENS PLACEMENT ([place]);  Surgeon: Tonny Branch, MD;  Location: [facility];  Service: Ophthalmology;  Laterality: Right;  CDE: 7.05  . CATARACT EXTRACTION W/PHACO Left 07/01/2015   Procedure: CATARACT EXTRACTION PHACO AND INTRAOCULAR LENS PLACEMENT (I[facility]);  Surgeon: Tonny Branch, MD;  Location: [facility];  Service: Ophthalmology;  Laterality: Left;  CDE: 7.49  . COLONOSCOPY [name] 05/06/2015   Procedure: COLONOSCOPY;  Surgeon: Rogene Houston, MD;  Location: [facility];  Service: Endoscopy;  Laterality: [name];  930  . CORONARY ARTERY BYPASS GRAFT [name] 04/08/2013   Procedure: CORONARY ARTERY BYPASS GRAFTING (CABG);  Surgeon: Ivin Poot, MD;  Location: [place];  Service: Open Heart Surgery;  Laterality: [name];  Times 3 using left internal mammary artery; endoscopically harvested  right saphenous vein and left radial artery  . INTRAOPERATIVE TRANSESOPHAGEAL ECHOCARDIOGRAM [name] 04/08/2013   Procedure: INTRAOPERATIVE TRANSESOPHAGEAL ECHOCARDIOGRAM;  Surgeon: Ivin Poot, MD;  Location: [place];  Service: Open Heart Surgery;  Laterality: [name];  . LAPAROSCOPIC APPENDECTOMY [name] 12/23/2015   Procedure: APPENDECTOMY LAPAROSCOPIC;  Surgeon: Donnie Mesa, MD;  Location: [place];  Service: General;  Laterality: [name];  . LEFT HEART CATHETERIZATION WITH CORONARY ANGIOGRAM [name] 04/07/2013   Procedure: LEFT HEART CATHETERIZATION WITH CORONARY ANGIOGRAM;  Surgeon: Leonie Man, MD;   Location: [facility] CATH LAB;  Service: Cardiovascular;  Laterality: [name];  . LYMPH NODE BIOPSY Left 1983   neck  . MASTECTOMY Right    w/axillary lymph node dissection  . NM MYOVIEW LTD  08/2019    EF 55 to 60%.  LOW RISK.  No ischemia or infarction noted.  Marland Kitchen NM TREADMILL MYOVIEW LTD  08/06/2014   Exercise 5:07 min, 6.3 METS; symptoms noted or extreme dyspnea and lightheadedness with some chest tightness. No EKG changes. No ischemia or infarction was normal EF.  Marland Kitchen RADIAL ARTERY HARVEST Left 04/08/2013   Procedure: RADIAL ARTERY HARVEST;  Surgeon: Ivin Poot, MD;  Location: [place];  Service: Open Heart Surgery;  Laterality: Left;  . TONSILLECTOMY  1974  . TRANSTHORACIC ECHOCARDIOGRAM  08/06/2014; 02/2018   a) Nl EF 55-60%. Gr 1 DD-high LVEDP. Mod AoV Sclerosis w/o AS. Mild AI; mild MS - mean grad 7 mmHg @ HR 122 bpm.;; b) Initial: EF 55-60%, GRII DD.  HighLAP/LVEDP.  ? Bicuspid AoV -severely Ca2+.  Mild Ao root dilation.  Severe MAC, mild MS (MVA by P1/2 T & continuity equation ~2 cm).  Mod RV dilation w/ mild- mod reduced EF.---> Limited w/Defiinity: No RWMA, Mild AS, Mod MS (~mean gradient 8 mmHg  . TRANSTHORACIC ECHOCARDIOGRAM  02/2020   Normal LV size and function.  EF 65 to 70% no R WMA.  Mild LVH with basal septum.  GRII DD with elevated LVEDP.  Moderate reduced RV function with mild RV dilation, but normal PA pressures.  Mild LA dilation.  Moderate mitral stenosis, moderate AS & AI.  Mild MR, Mod MS (mean gradient 9 mmHg).  Moderate calcified AS w/ Mod AI -> Mean gradient 17.3 mmHg with peak 32 mm.  . TRANSTHORACIC ECHOCARDIOGRAM  03/2019    EF 60-65%, mild concentric LVH. Gr 2 DD. No RWMA. Mild-Mod RV dilation & enlargement. Mild LA dilation. Mod MV Calcification . Mod MS (previously read as mild, but mean gradient is lower than and estimated valve area is higher than last check).  Mild-Mod AS (Mean Gradient 13.5 mmHg)    Family History  Problem Relation Age of Onset  . Alzheimer's disease  Mother   . Stroke Mother   . Hypertension Mother   . Hyperlipidemia Mother   . Diabetes Mother   . Hypertension Father   . Diabetes Paternal Grandmother   . Diabetes Paternal Grandfather   . Breast cancer Paternal Aunt   . Ovarian cancer Paternal Aunt    Social History:  reports that he quit smoking about 38 years ago. His smoking use included cigarettes. He has a 15.00 pack-year smoking history. He has never used smokeless tobacco. He reports that he does not drink alcohol and does not use drugs.  Allergies: No Known Allergies  Medications Prior to Admission  Medication Sig Dispense Refill  . acetaminophen (TYLENOL) 500 MG tablet Take 500 mg by mouth every 6 (six) hours as needed for mild pain.     Marland Kitchen  albuterol (PROVENTIL HFA;VENTOLIN HFA) 108 (90 Base) MCG/ACT inhaler Inhale 2 puffs into the lungs every 6 (six) hours as needed for wheezing or shortness of breath.    Marland Kitchen albuterol (PROVENTIL) 2 MG tablet Take 2 mg by mouth daily.    Marland Kitchen ALPRAZolam (XANAX) 1 MG tablet Take 0.5-1 mg by mouth at bedtime.     . Artificial Tear Solution (SOOTHE XP OP) Place 1 drop into both eyes daily as needed (dry eyes).    Marland Kitchen aspirin EC 81 MG tablet Take 1 tablet (81 mg total) by mouth daily. 90 tablet 3  . Cholecalciferol (VITAMIN D3) 250 MCG (10000 UT) capsule Take 10,000 Units by mouth daily.    . citalopram (CELEXA) 20 MG tablet Take 40 mg by mouth every evening.     . diphenhydrAMINE (BENADRYL) 25 MG tablet Take 25 mg by mouth daily as needed for allergies.    . Ferrous Gluconate (IRON 27 PO) Take 27 mg by mouth daily.    . Garlic 2585 MG CAPS Take 1,000 mg by mouth daily.    . Ginkgo Biloba 120 MG CAPS Take 120 mg by mouth daily.    Marland Kitchen levothyroxine (SYNTHROID, LEVOTHROID) 75 MCG tablet Take 75 mcg by mouth daily before breakfast.     . Magnesium 500 MG CAPS Take 1,000 mg by mouth daily.    . Omega-3 Fatty Acids (SALMON OIL-1000 PO) Take 1,000 mg by mouth daily.    Marland Kitchen OVER THE COUNTER MEDICATION Take 1  tablet by mouth daily. Ultra kidney complex    . OVER THE COUNTER MEDICATION Take 1 tablet by mouth daily. Liver 10 complex otc supplement    . rOPINIRole (REQUIP) 4 MG tablet Take 4 mg by mouth See admin instructions. Take 4 mg every night, take an additional 4 mg in the morning 3 times weekly    . simvastatin (ZOCOR) 80 MG tablet Take 80 mg by mouth every evening.     . vitamin B-12 (CYANOCOBALAMIN) 500 MCG tablet Take 500 mcg by mouth daily.    . nitroGLYCERIN (NITROSTAT) 0.4 MG SL tablet Place 1 tablet (0.4 mg total) under the tongue every 5 (five) minutes as needed for chest pain. 25 tablet 12    Results for orders placed or performed during the hospital encounter of 09/07/20 (from the past 48 hour(s))  SARS CORONAVIRUS 2 (TAT 6-24 HRS) Nasopharyngeal Nasopharyngeal Swab     Status: None   Collection Time: 09/07/20  8:34 AM   Specimen: Nasopharyngeal Swab  Result Value Ref Range   SARS Coronavirus 2 NEGATIVE NEGATIVE    Comment: (NOTE) SARS-CoV-2 target nucleic acids are NOT DETECTED.  The SARS-CoV-2 RNA is generally detectable in upper and lower respiratory specimens during the acute phase of infection. Negative results do not preclude SARS-CoV-2 infection, do not rule out co-infections with other pathogens, and should not be used as the sole basis for treatment or other patient management decisions. Negative results must be combined with clinical observations, patient history, and epidemiological information. The expected result is Negative.  Fact Sheet for Patients: SugarRoll.be  Fact Sheet for Healthcare Providers: https://www.woods-mathews.com/  This test is not yet approved or cleared by the [place] FDA and  has been authorized for detection and/or diagnosis of SARS-CoV-2 by FDA under an Emergency Use Authorization (EUA). This EUA will remain  in effect (meaning this test can be used) for the duration of the COVID-19  declaration under Se ction 564(b)(1) of the Act, 21 U.S.C. section 360bbb-3(b)(1), unless  the authorization is terminated or revoked sooner.  Performed at [facility], [place] [address], [place], [place] [address]   CBC with Differential/Platelet     Status: None   Collection Time: 09/07/20  9:34 AM  Result Value Ref Range   WBC 7.7 4.0 - 10.5 K/uL   RBC 4.79 4.22 - 5.81 MIL/uL   Hemoglobin 14.5 13.0 - 17.0 g/dL   HCT 45.4 39 - 52 %   MCV 94.8 80.0 - 100.0 fL   MCH 30.3 26.0 - 34.0 pg   MCHC 31.9 30.0 - 36.0 g/dL   RDW 13.2 11.5 - 15.5 %   Platelets 224 150 - 400 K/uL   nRBC 0.0 0.0 - 0.2 %   Neutrophils Relative % 67 %   Neutro Abs 5.1 1.7 - 7.7 K/uL   Lymphocytes Relative 18 %   Lymphs Abs 1.4 0.7 - 4.0 K/uL   Monocytes Relative 10 %   Monocytes Absolute 0.8 0.1 - 1.0 K/uL   Eosinophils Relative 4 %   Eosinophils Absolute 0.3 0.0 - 0.5 K/uL   Basophils Relative 1 %   Basophils Absolute 0.1 0.0 - 0.1 K/uL   Immature Granulocytes 0 %   Abs Immature Granulocytes 0.03 0.00 - 0.07 K/uL    Comment: Performed at [facility], [address] [place], [place] [address]  Basic metabolic panel     Status: Abnormal   Collection Time: 09/07/20  9:34 AM  Result Value Ref Range   Sodium 138 135 - 145 mmol/L   Potassium 4.6 3.5 - 5.1 mmol/L   Chloride 102 98 - 111 mmol/L   CO2 27 22 - 32 mmol/L   Glucose, Bld 123 (H) 70 - 99 mg/dL    Comment: Glucose reference range applies only to samples taken after fasting for at least 8 hours.   BUN 18 8 - 23 mg/dL   Creatinine, Ser 1.36 (H) 0.61 - 1.24 mg/dL   Calcium 9.2 8.9 - 10.3 mg/dL   GFR, Estimated 56 (L) >60 mL/min    Comment: (NOTE) Calculated using the CKD-EPI Creatinine Equation (2021)    Anion gap 9 5 - 15    Comment: Performed at [facility], [address] [place], [place] [address]   VAS US CAROTID  Result Date: 09/06/2020 Carotid Arterial Duplex Study Indications:  Carotid artery disease. Risk Factors: Hypertension,  hyperlipidemia, past history of smoking, coronary               artery disease. Performing Technologist: Ronal Fear RVS, RCS  Examination Guidelines: A complete evaluation includes B-mode imaging, spectral Doppler, color Doppler, and power Doppler as needed of all accessible portions of each vessel. Bilateral testing is considered an integral part of a complete examination. Limited examinations for reoccurring indications may be performed as noted.  Right Carotid Findings: +----------+--------+--------+--------+---------------------+------------------+           PSV cm/sEDV cm/sStenosisPlaque Description   Comments           +----------+--------+--------+--------+---------------------+------------------+ CCA Prox  88                      homogeneous                             +----------+--------+--------+--------+---------------------+------------------+ CCA Mid   89                      homogeneous                             +----------+--------+--------+--------+---------------------+------------------+  CCA Distal92      9               homogeneous and                                                           irregular                               +----------+--------+--------+--------+---------------------+------------------+ ICA Prox  100                     heterogenous and     Trickle flow                                         calcific                                +----------+--------+--------+--------+---------------------+------------------+ ICA Mid   34                      heterogenous         High resistant                                                            flow               +----------+--------+--------+--------+---------------------+------------------+ ICA Distal18                                           Highl resistant                                                           flow                +----------+--------+--------+--------+---------------------+------------------+ ECA       137     12                                                      +----------+--------+--------+--------+---------------------+------------------+ +----------+--------+-------+----------------+-------------------+           PSV cm/sEDV cmsDescribe        Arm Pressure (mmHG) +----------+--------+-------+----------------+-------------------+ [id number]            Multiphasic, WNL                    +----------+--------+-------+----------------+-------------------+ +---------+--------+--+--------+--+---------+ VertebralPSV cm/s63EDV cm/s14Antegrade +---------+--------+--+--------+--+---------+  Left Carotid Findings: +----------+--------+--------+--------+-------------------------+--------+           PSV cm/sEDV  cm/sStenosisPlaque Description       Comments +----------+--------+--------+--------+-------------------------+--------+ CCA Prox  118     19              homogeneous and irregular         +----------+--------+--------+--------+-------------------------+--------+ CCA Mid   121     23              homogeneous and irregular         +----------+--------+--------+--------+-------------------------+--------+ CCA Distal154     24              homogeneous and irregular         +----------+--------+--------+--------+-------------------------+--------+ ICA Prox  163     24      1-39%   heterogenous and calcific         +----------+--------+--------+--------+-------------------------+--------+ ICA Mid   133     23                                                +----------+--------+--------+--------+-------------------------+--------+ ICA Distal146     29                                                +----------+--------+--------+--------+-------------------------+--------+ ECA       165     17                                                 +----------+--------+--------+--------+-------------------------+--------+ +----------+--------+--------+----------------+-------------------+           PSV cm/sEDV cm/sDescribe        Arm Pressure (mmHG) +----------+--------+--------+----------------+-------------------+ [id number]             Multiphasic, WNL                    +----------+--------+--------+----------------+-------------------+ +---------+--------+--+--------+--+---------+ VertebralPSV cm/s86EDV cm/s17Antegrade +---------+--------+--+--------+--+---------+   Summary: Right Carotid: High resistant flow with dampening at the distal ICA consistent                with distal occlusion or obstruction. Trickle flow noted in the                proximal ICA. Left Carotid: Velocities in the left ICA are consistent with a 1-39% stenosis. Vertebrals:  Bilateral vertebral arteries demonstrate antegrade flow. Subclavians: Normal flow hemodynamics were seen in bilateral subclavian              arteries. *See table(s) above for measurements and observations.  Electronically signed by Harold Barban MD on 09/06/2020 at 5:04:01 PM.    Final     Review of Systems  Blood pressure (!) 170/87, pulse (!) 105, temperature 98.1 F (36.7 C), temperature source Oral, resp. rate 19, height _0  (1.905 m), weight 106 kg, SpO2 100 %. Physical Exam HENT:     Mouth/Throat:     Mouth: Mucous membranes are moist.     Pharynx: Oropharynx is clear.  Eyes:     General: No scleral icterus.    Conjunctiva/sclera: Conjunctivae normal.  Cardiovascular:     Rate and Rhythm: Normal rate and regular rhythm.  Heart sounds: Murmur heard.      Comments: Grade 2/6 systolic murmur best heard at aortic area. Abdominal:     Comments: Abdomen is full.  He has supraumbilical hernia which is completely reducible.  Abdomen is soft and nontender with organomegaly or masses.  Musculoskeletal:        General: No swelling.     Cervical back: Neck supple.   Lymphadenopathy:     Cervical: No cervical adenopathy.  Skin:    General: Skin is warm and dry.  Neurological:     Mental Status: He is alert.      Assessment/Plan  History of colonic adenoma. Recent onset of diarrhea. Surveillance colonoscopy.  Hildred Laser, MD 09/08/2020, 9:34 AM

## 2020-09-08 NOTE — Anesthesia Preprocedure Evaluation (Signed)
Anesthesia Evaluation  Patient identified by MRN, date of birth, ID band Patient awake    Reviewed: Allergy & Precautions, H&P , NPO status , Patient's Chart, lab work & pertinent test results, reviewed documented beta blocker date and time   Airway Mallampati: II  TM Distance: >3 FB Neck ROM: full    Dental no notable dental hx.    Pulmonary sleep apnea , former smoker,    Pulmonary exam normal breath sounds clear to auscultation       Cardiovascular Exercise Tolerance: Good hypertension, + CAD  + Valvular Problems/Murmurs AS and MR  Rhythm:regular Rate:Normal + Systolic murmurs    Neuro/Psych PSYCHIATRIC DISORDERS Depression CVA    GI/Hepatic negative GI ROS, Neg liver ROS,   Endo/Other  Hypothyroidism   Renal/GU negative Renal ROS  negative genitourinary   Musculoskeletal   Abdominal   Peds  Hematology  (+) Blood dyscrasia, anemia ,   Anesthesia Other Findings   Reproductive/Obstetrics negative OB ROS                             Anesthesia Physical Anesthesia Plan  ASA: III  Anesthesia Plan: General   Post-op Pain Management:    Induction:   PONV Risk Score and Plan: Propofol infusion  Airway Management Planned:   Additional Equipment:   Intra-op Plan:   Post-operative Plan:   Informed Consent: I have reviewed the patients History and Physical, chart, labs and discussed the procedure including the risks, benefits and alternatives for the proposed anesthesia with the patient or authorized representative who has indicated his/her understanding and acceptance.     Dental Advisory Given  Plan Discussed with: CRNA  Anesthesia Plan Comments:         Anesthesia Quick Evaluation

## 2020-09-08 NOTE — Anesthesia Postprocedure Evaluation (Signed)
Anesthesia Post Note  Patient: KHAYREE DELELLIS  Procedure(s) Performed: COLONOSCOPY WITH PROPOFOL ([name] ) POLYPECTOMY BIOPSY  Patient location during evaluation: PACU Anesthesia Type: General Level of consciousness: awake, oriented, awake and alert and patient cooperative Pain management: pain level controlled Vital Signs Assessment: post-procedure vital signs reviewed and stable Respiratory status: spontaneous breathing, respiratory function stable and nonlabored ventilation Cardiovascular status: blood pressure returned to baseline and stable Postop Assessment: no headache and no backache Anesthetic complications: no   No complications documented.   Last Vitals:  Vitals:   09/08/20 0832 09/08/20 1014  BP: (!) 170/87   Pulse: (!) 105 80  Resp: 19 16  Temp: 36.7 C (P) 36.8 C  SpO2: 100%     Last Pain:  Vitals:   09/08/20 0938  TempSrc:   PainSc: 0-No pain                 Orlie Dakin

## 2020-09-08 NOTE — Anesthesia Procedure Notes (Signed)
Date/Time: 09/08/2020 9:43 AM Performed by: Orlie Dakin, CRNA Pre-anesthesia Checklist: Patient identified, Emergency Drugs available, Suction available and Patient being monitored Patient Re-evaluated:Patient Re-evaluated prior to induction Oxygen Delivery Method: Nasal cannula Induction Type: IV induction Placement Confirmation: positive ETCO2

## 2020-09-08 NOTE — Op Note (Signed)
[facility] Patient Name: Ryan [name] Procedure Date: 09/08/2020 9:17 AM MRN: [id number] Date of Birth: 11-28-1950 Attending MD: Hildred Laser , MD CSN: [id number] Age: 69 Admit Type: Outpatient Procedure:                Colonoscopy Indications:              High risk colon cancer surveillance: Personal                            history of colonic polyps Providers:                Hildred Laser, MD, Otis Peak B. Sharon Seller, RN, Dereck Leep, Technician, Aram Candela Referring MD:             [employer], MD Medicines:                Propofol per Anesthesia Complications:            No immediate complications. Estimated Blood Loss:     Estimated blood loss was minimal. Procedure:                Pre-Anesthesia Assessment:                           - Prior to the procedure, a History and Physical                            was performed, and patient medications and                            allergies were reviewed. The patient's tolerance of                            previous anesthesia was also reviewed. The risks                            and benefits of the procedure and the sedation                            options and risks were discussed with the patient.                            All questions were answered, and informed consent                            was obtained. Prior Anticoagulants: The patient has                            taken no previous anticoagulant or antiplatelet                            agents except for aspirin. ASA Grade Assessment:  III - A patient with severe systemic disease. After                            reviewing the risks and benefits, the patient was                            deemed in satisfactory condition to undergo the                            procedure.                           After obtaining informed consent, the colonoscope                            was passed  under direct vision. Throughout the                            procedure, the patient's blood pressure, pulse, and                            oxygen saturations were monitored continuously. The                            [id number]-[id number] ([id number]) scope was introduced through                            the anus and advanced to the the cecum, identified                            by appendiceal orifice and ileocecal valve. The                            colonoscopy was somewhat difficult due to a                            redundant colon. Successful completion of the                            procedure was aided by using manual pressure. The                            patient tolerated the procedure well. The quality                            of the bowel preparation was adequate to identify                            polyps. The ileocecal valve, appendiceal orifice,                            and rectum were photographed. Scope In: 9:44:47 AM Scope Out: 10:11:05 AM Scope Withdrawal Time: 0 hours 8 minutes 39 seconds  Total Procedure Duration: 0  hours 26 minutes 18 seconds  Findings:      The perianal and digital rectal examinations were normal.      A diffuse area of mild melanosis was found in the entire colon.      A small polyp was found in the proximal ascending colon. Biopsies were       taken with a cold forceps for histology. The pathology specimen was       placed into Bottle Number 1.      A 5 mm polyp was found in the sigmoid colon. The polyp was removed with       a cold snare. Resection and retrieval were complete. The pathology       specimen was placed into Bottle Number 1.      Scattered diverticula were found in the sigmoid colon.      The exam was otherwise normal throughout the examined colon.      Biopsies for histology were taken with a cold forceps from the ascending       colon and sigmoid colon for evaluation of microscopic colitis.      External hemorrhoids were  found during retroflexion. The hemorrhoids       were small. Impression:               - Melanosis in the colon.                           - One small polyp in the proximal ascending colon.                            Biopsied.                           - One 5 mm polyp in the sigmoid colon, removed with                            a cold snare. Resected and retrieved.                           - Diverticulosis in the sigmoid colon.                           - External hemorrhoids.                           - Biopsies were taken with a cold forceps from the                            ascending colon and sigmoid colon for evaluation of                            microscopic colitis. Moderate Sedation:      Per Anesthesia Care Recommendation:           - Patient has a contact number available for                            emergencies. The signs and symptoms of potential  delayed complications were discussed with the                            patient. Return to normal activities tomorrow.                            Written discharge instructions were provided to the                            patient.                           - High fiber diet today.                           - Continue present medications.                           - No aspirin, ibuprofen, naproxen, or other                            non-steroidal anti-inflammatory drugs for 1 day.                           - Await pathology results.                           - Repeat colonoscopy is recommended. The                            colonoscopy date will be determined after pathology                            results from today's exam become available for                            review. Procedure Code(s):        --- Professional ---                           [phone], Colonoscopy, flexible; with removal of                            tumor(s), polyp(s), or other lesion(s) by snare                             technique                           45380, 69, Colonoscopy, flexible; with biopsy,                            single or multiple Diagnosis Code(s):        --- Professional ---                           Z86.010, Personal history of colonic polyps  K63.89, Other specified diseases of intestine                           K63.5, Polyp of colon                           K64.4, Residual hemorrhoidal skin tags                           K57.30, Diverticulosis of large intestine without                            perforation or abscess without bleeding CPT copyright 2019 American [employer] Association. All rights reserved. The codes documented in this report are preliminary and upon coder review may  be revised to meet current compliance requirements. Hildred Laser, MD Hildred Laser, MD 09/08/2020 10:21:03 AM This report has been signed electronically. Number of Addenda: 0

## 2020-09-08 NOTE — Transfer of Care (Signed)
Immediate Anesthesia Transfer of Care Note  Patient: Ryan [name]  Procedure(s) Performed: COLONOSCOPY WITH PROPOFOL ([name] ) POLYPECTOMY BIOPSY  Patient Location: PACU  Anesthesia Type:General  Level of Consciousness: awake, alert , oriented and patient cooperative  Airway & Oxygen Therapy: Patient Spontanous Breathing  Post-op Assessment: Report given to RN, Post -op Vital signs reviewed and stable and Patient moving all extremities  Post vital signs: Reviewed and stable  Last Vitals:  Vitals Value Taken Time  BP    Temp    Pulse 80 09/08/20 1014  Resp 16 09/08/20 1014  SpO2 93 % 09/08/20 1014  Vitals shown include unvalidated device data.  Last Pain:  Vitals:   09/08/20 0938  TempSrc:   PainSc: 0-No pain      Patients Stated Pain Goal: 5 (27/06/23 7628)  Complications: No complications documented.

## 2020-09-08 NOTE — Discharge Instructions (Signed)
Resume aspirin on 09/09/2020. Decrease magnesium to 500 mg daily as it might be causing her diarrhea. Resume other medications as before. High-fiber diet. No driving for 24 hours. Physician will call with biopsy results.      Colonoscopy, Adult, Care After This sheet gives you information about how to care for yourself after your procedure. Your doctor may also give you more specific instructions. If you have problems or questions, call your doctor. What can I expect after the procedure? After the procedure, it is common to have:  A small amount of blood in your poop (stool) for 24 hours.  Some gas.  Mild cramping or bloating in your belly (abdomen). Follow these instructions at home: Eating and drinking   Drink enough fluid to keep your pee (urine) pale yellow.  Follow instructions from your doctor about what you cannot eat or drink.  Return to your normal diet as told by your doctor. Avoid heavy or fried foods that are hard to digest. Activity  Rest as told by your doctor.  Do not sit for a long time without moving. Get up to take short walks every 1-2 hours. This is important. Ask for help if you feel weak or unsteady.  Return to your normal activities as told by your doctor. Ask your doctor what activities are safe for you. To help cramping and bloating:   Try walking around.  Put heat on your belly as told by your doctor. Use the heat source that your doctor recommends, such as a moist heat pack or a heating pad. ? Put a towel between your skin and the heat source. ? Leave the heat on for 20-30 minutes. ? Remove the heat if your skin turns bright red. This is very important if you are unable to feel pain, heat, or cold. You may have a greater risk of getting burned. General instructions  For the first 24 hours after the procedure: ? Do not drive or use machinery. ? Do not sign important documents. ? Do not drink alcohol. ? Do your daily activities more slowly  than normal. ? Eat foods that are soft and easy to digest.  Take over-the-counter or prescription medicines only as told by your doctor.  Keep all follow-up visits as told by your doctor. This is important. Contact a doctor if:  You have blood in your poop 2-3 days after the procedure. Get help right away if:  You have more than a small amount of blood in your poop.  You see large clumps of tissue (blood clots) in your poop.  Your belly is swollen.  You feel like you may vomit (nauseous).  You vomit.  You have a fever.  You have belly pain that gets worse, and medicine does not help your pain. Summary  After the procedure, it is common to have a small amount of blood in your poop. You may also have mild cramping and bloating in your belly.  For the first 24 hours after the procedure, do not drive or use machinery, do not sign important documents, and do not drink alcohol.  Get help right away if you have a lot of blood in your poop, feel like you may vomit, have a fever, or have more belly pain. This information is not intended to replace advice given to you by your health care provider. Make sure you discuss any questions you have with your health care provider. Document Revised: 04/21/2019 Document Reviewed: 04/21/2019 Elsevier Patient Education  [place].  High-Fiber Diet Fiber, also called dietary fiber, is a type of carbohydrate that is found in fruits, vegetables, whole grains, and beans. A high-fiber diet can have many health benefits. Your health care provider may recommend a high-fiber diet to help:  Prevent constipation. Fiber can make your bowel movements more regular.  Lower your cholesterol.  Relieve the following conditions: ? Swelling of veins in the anus (hemorrhoids). ? Swelling and irritation (inflammation) of specific areas of the digestive tract (uncomplicated diverticulosis). ? A problem of the large intestine (colon) that sometimes  causes pain and diarrhea (irritable bowel syndrome, IBS).  Prevent overeating as part of a weight-loss plan.  Prevent heart disease, type 2 diabetes, and certain cancers. What is my plan? The recommended daily fiber intake in grams (g) includes:  38 g for men age 79 or younger.  30 g for men over age 45.  16 g for women age 57 or younger.  21 g for women over age 32. You can get the recommended daily intake of dietary fiber by:  Eating a variety of fruits, vegetables, grains, and beans.  Taking a fiber supplement, if it is not possible to get enough fiber through your diet. What do I need to know about a high-fiber diet?  It is better to get fiber through food sources rather than from fiber supplements. There is not a lot of research about how effective supplements are.  Always check the fiber content on the nutrition facts label of any prepackaged food. Look for foods that contain 5 g of fiber or more per serving.  Talk with a diet and nutrition specialist (dietitian) if you have questions about specific foods that are recommended or not recommended for your medical condition, especially if those foods are not listed below.  Gradually increase how much fiber you consume. If you increase your intake of dietary fiber too quickly, you may have bloating, cramping, or gas.  Drink plenty of water. Water helps you to digest fiber. What are tips for following this plan?  Eat a wide variety of high-fiber foods.  Make sure that half of the grains that you eat each day are whole grains.  Eat breads and cereals that are made with whole-grain flour instead of refined flour or white flour.  Eat brown rice, bulgur wheat, or millet instead of white rice.  Start the day with a breakfast that is high in fiber, such as a cereal that contains 5 g of fiber or more per serving.  Use beans in place of meat in soups, salads, and pasta dishes.  Eat high-fiber snacks, such as berries, raw  vegetables, nuts, and popcorn.  Choose whole fruits and vegetables instead of processed forms like juice or sauce. What foods can I eat?  Fruits Berries. Pears. Apples. Oranges. Avocado. Prunes and raisins. Dried figs. Vegetables Sweet potatoes. Spinach. Kale. Artichokes. Cabbage. Broccoli. Cauliflower. Green peas. Carrots. Squash. Grains Whole-grain breads. Multigrain cereal. Oats and oatmeal. Brown rice. Barley. Bulgur wheat. [place]. Quinoa. Bran muffins. Popcorn. Rye wafer crackers. Meats and other proteins Navy, kidney, and pinto beans. Soybeans. Split peas. Lentils. Nuts and seeds. Dairy Fiber-fortified yogurt. Beverages Fiber-fortified soy milk. Fiber-fortified orange juice. Other foods Fiber bars. The items listed above may not be a complete list of recommended foods and beverages. Contact a dietitian for more options. What foods are not recommended? Fruits Fruit juice. Cooked, strained fruit. Vegetables Fried potatoes. Canned vegetables. Well-cooked vegetables. Grains White bread. Pasta made with refined flour. White rice. Meats and  other proteins Fatty cuts of meat. Fried chicken or fried fish. Dairy Milk. Yogurt. Cream cheese. Sour cream. Fats and oils Butters. Beverages Soft drinks. Other foods Cakes and pastries. The items listed above may not be a complete list of foods and beverages to avoid. Contact a dietitian for more information. Summary  Fiber is a type of carbohydrate. It is found in fruits, vegetables, whole grains, and beans.  There are many health benefits of eating a high-fiber diet, such as preventing constipation, lowering blood cholesterol, helping with weight loss, and reducing your risk of heart disease, diabetes, and certain cancers.  Gradually increase your intake of fiber. Increasing too fast can result in cramping, bloating, and gas. Drink plenty of water while you increase your fiber.  The best sources of fiber include whole fruits and  vegetables, whole grains, nuts, seeds, and beans. This information is not intended to replace advice given to you by your health care provider. Make sure you discuss any questions you have with your health care provider. Document Revised: 07/30/2017 Document Reviewed: 07/30/2017 Elsevier Patient Education  2020 [place] After These instructions provide you with information about caring for yourself after your procedure. Your health care provider may also give you more specific instructions. Your treatment has been planned according to current medical practices, but problems sometimes occur. Call your health care provider if you have any problems or questions after your procedure. What can I expect after the procedure? After your procedure, you may:  Feel sleepy for several hours.  Feel clumsy and have poor balance for several hours.  Feel forgetful about what happened after the procedure.  Have poor judgment for several hours.  Feel nauseous or vomit.  Have a sore throat if you had a breathing tube during the procedure. Follow these instructions at home: For at least 24 hours after the procedure:      Have a responsible adult stay with you. It is important to have someone help care for you until you are awake and alert.  Rest as needed.  Do not: ? Participate in activities in which you could fall or become injured. ? Drive. ? Use heavy machinery. ? Drink alcohol. ? Take sleeping pills or medicines that cause drowsiness. ? Make important decisions or sign legal documents. ? Take care of children on your own. Eating and drinking  Follow the diet that is recommended by your health care provider.  If you vomit, drink water, juice, or soup when you can drink without vomiting.  Make sure you have little or no nausea before eating solid foods. General instructions  Take over-the-counter and prescription medicines only as told by  your health care provider.  If you have sleep apnea, surgery and certain medicines can increase your risk for breathing problems. Follow instructions from your health care provider about wearing your sleep device: ? Anytime you are sleeping, including during daytime naps. ? While taking prescription pain medicines, sleeping medicines, or medicines that make you drowsy.  If you smoke, do not smoke without supervision.  Keep all follow-up visits as told by your health care provider. This is important. Contact a health care provider if:  You keep feeling nauseous or you keep vomiting.  You feel light-headed.  You develop a rash.  You have a fever. Get help right away if:  You have trouble breathing. Summary  For several hours after your procedure, you may feel sleepy and have poor judgment.  Have a responsible adult stay with you for at least 24 hours or until you are awake and alert. This information is not intended to replace advice given to you by your health care provider. Make sure you discuss any questions you have with your health care provider. Document Revised: 12/24/2017 Document Reviewed: 01/16/2016 Elsevier Patient Education  [place].

## 2020-09-08 NOTE — Anesthesia Postprocedure Evaluation (Signed)
Anesthesia Post Note  Patient: ISAM UNREIN  Procedure(s) Performed: COLONOSCOPY WITH PROPOFOL ([name] ) POLYPECTOMY BIOPSY  Patient location during evaluation: PACU Anesthesia Type: General Level of consciousness: awake and alert and oriented Pain management: pain level controlled Vital Signs Assessment: post-procedure vital signs reviewed and stable Respiratory status: spontaneous breathing, nonlabored ventilation and respiratory function stable Cardiovascular status: blood pressure returned to baseline and stable Postop Assessment: no apparent nausea or vomiting Anesthetic complications: no   No complications documented.   Last Vitals:  Vitals:   09/08/20 1045 09/08/20 1055  BP: 120/71 114/60  Pulse: 79 85  Resp: 13 18  Temp:  36.8 C  SpO2: 96% 98%    Last Pain:  Vitals:   09/08/20 1055  TempSrc: Oral  PainSc: 0-No pain                 Orlie Dakin

## 2020-09-09 LAB — SURGICAL PATHOLOGY

## 2020-09-13 ENCOUNTER — Encounter (HOSPITAL_COMMUNITY): Payer: Self-pay | Admitting: Internal Medicine

## 2020-09-13 ENCOUNTER — Other Ambulatory Visit (INDEPENDENT_AMBULATORY_CARE_PROVIDER_SITE_OTHER): Payer: Self-pay | Admitting: *Deleted

## 2020-09-29 DIAGNOSIS — Z23 Encounter for immunization: Secondary | ICD-10-CM | POA: Diagnosis not present

## 2020-12-21 DIAGNOSIS — H43813 Vitreous degeneration, bilateral: Secondary | ICD-10-CM | POA: Diagnosis not present

## 2021-01-10 ENCOUNTER — Other Ambulatory Visit (HOSPITAL_COMMUNITY): Payer: Self-pay

## 2021-01-10 DIAGNOSIS — C50911 Malignant neoplasm of unspecified site of right female breast: Secondary | ICD-10-CM

## 2021-01-11 ENCOUNTER — Ambulatory Visit (HOSPITAL_COMMUNITY)
Admission: RE | Admit: 2021-01-11 | Discharge: 2021-01-11 | Disposition: A | Discharge status: Home / Self Care | Payer: Medicare Other | Source: Ambulatory Visit | Attending: Nurse Practitioner | Admitting: Nurse Practitioner

## 2021-01-11 ENCOUNTER — Other Ambulatory Visit: Payer: Self-pay

## 2021-01-11 ENCOUNTER — Ambulatory Visit (HOSPITAL_COMMUNITY): Admission: RE | Admit: 2021-01-11 | Payer: Medicare Other | Source: Ambulatory Visit

## 2021-01-11 ENCOUNTER — Encounter (HOSPITAL_COMMUNITY): Payer: Self-pay

## 2021-01-11 ENCOUNTER — Inpatient Hospital Stay (HOSPITAL_COMMUNITY): Payer: Medicare Other | Attending: Hematology

## 2021-01-11 DIAGNOSIS — Z79899 Other long term (current) drug therapy: Secondary | ICD-10-CM | POA: Insufficient documentation

## 2021-01-11 DIAGNOSIS — Z87891 Personal history of nicotine dependence: Secondary | ICD-10-CM | POA: Diagnosis not present

## 2021-01-11 DIAGNOSIS — C50922 Malignant neoplasm of unspecified site of left male breast: Secondary | ICD-10-CM | POA: Insufficient documentation

## 2021-01-11 DIAGNOSIS — C50911 Malignant neoplasm of unspecified site of right female breast: Secondary | ICD-10-CM

## 2021-01-11 DIAGNOSIS — Z8571 Personal history of Hodgkin lymphoma: Secondary | ICD-10-CM | POA: Diagnosis not present

## 2021-01-11 DIAGNOSIS — Z853 Personal history of malignant neoplasm of breast: Secondary | ICD-10-CM | POA: Insufficient documentation

## 2021-01-11 LAB — CBC WITH DIFFERENTIAL/PLATELET
Abs Immature Granulocytes: 0.03 10*3/uL (ref 0.00–0.07)
Basophils Absolute: 0.1 10*3/uL (ref 0.0–0.1)
Basophils Relative: 2 %
Eosinophils Absolute: 0.2 10*3/uL (ref 0.0–0.5)
Eosinophils Relative: 3 %
HCT: 42.1 % (ref 39.0–52.0)
Hemoglobin: 13.3 g/dL (ref 13.0–17.0)
Immature Granulocytes: 1 %
Lymphocytes Relative: 18 %
Lymphs Abs: 1.1 10*3/uL (ref 0.7–4.0)
MCH: 29.9 pg (ref 26.0–34.0)
MCHC: 31.6 g/dL (ref 30.0–36.0)
MCV: 94.6 fL (ref 80.0–100.0)
Monocytes Absolute: 0.6 10*3/uL (ref 0.1–1.0)
Monocytes Relative: 10 %
Neutro Abs: 4.2 10*3/uL (ref 1.7–7.7)
Neutrophils Relative %: 66 %
Platelets: 190 10*3/uL (ref 150–400)
RBC: 4.45 MIL/uL (ref 4.22–5.81)
RDW: 13.6 % (ref 11.5–15.5)
WBC: 6.2 10*3/uL (ref 4.0–10.5)
nRBC: 0 % (ref 0.0–0.2)

## 2021-01-11 LAB — COMPREHENSIVE METABOLIC PANEL
ALT: 42 U/L (ref 0–44)
AST: 34 U/L (ref 15–41)
Albumin: 4 g/dL (ref 3.5–5.0)
Alkaline Phosphatase: 68 U/L (ref 38–126)
Anion gap: 8 (ref 5–15)
BUN: 19 mg/dL (ref 8–23)
CO2: 26 mmol/L (ref 22–32)
Calcium: 8.8 mg/dL — ABNORMAL LOW (ref 8.9–10.3)
Chloride: 104 mmol/L (ref 98–111)
Creatinine, Ser: 1.41 mg/dL — ABNORMAL HIGH (ref 0.61–1.24)
GFR, Estimated: 53 mL/min — ABNORMAL LOW (ref >60)
Glucose, Bld: 132 mg/dL — ABNORMAL HIGH (ref 70–99)
Potassium: 4.5 mmol/L (ref 3.5–5.1)
Sodium: 138 mmol/L (ref 135–145)
Total Bilirubin: 0.9 mg/dL (ref 0.3–1.2)
Total Protein: 6.8 g/dL (ref 6.5–8.1)

## 2021-01-11 LAB — LACTATE DEHYDROGENASE: LDH: 168 U/L (ref 98–192)

## 2021-01-11 LAB — VITAMIN D 25 HYDROXY (VIT D DEFICIENCY, FRACTURES): Vit D, 25-Hydroxy: 70.82 ng/mL (ref 30–100)

## 2021-01-11 LAB — FOLATE: Folate: 46.5 ng/mL (ref >5.9)

## 2021-01-11 LAB — VITAMIN B12: Vitamin B-12: 921 pg/mL — ABNORMAL HIGH (ref 180–914)

## 2021-01-12 ENCOUNTER — Ambulatory Visit (HOSPITAL_COMMUNITY): Payer: Medicare Other | Admitting: Nurse Practitioner

## 2021-01-18 ENCOUNTER — Encounter (HOSPITAL_COMMUNITY): Payer: Self-pay | Admitting: Hematology

## 2021-01-18 ENCOUNTER — Other Ambulatory Visit: Payer: Self-pay

## 2021-01-18 ENCOUNTER — Inpatient Hospital Stay (HOSPITAL_BASED_OUTPATIENT_CLINIC_OR_DEPARTMENT_OTHER): Payer: Medicare Other | Admitting: Hematology

## 2021-01-18 VITALS — BP 91/56 | HR 71 | Temp 97.1°F | Resp 19 | Wt 231.6 lb

## 2021-01-18 DIAGNOSIS — C50921 Malignant neoplasm of unspecified site of right male breast: Secondary | ICD-10-CM

## 2021-01-18 DIAGNOSIS — Z79899 Other long term (current) drug therapy: Secondary | ICD-10-CM | POA: Diagnosis not present

## 2021-01-18 DIAGNOSIS — Z853 Personal history of malignant neoplasm of breast: Secondary | ICD-10-CM

## 2021-01-18 DIAGNOSIS — Z8571 Personal history of Hodgkin lymphoma: Secondary | ICD-10-CM | POA: Diagnosis not present

## 2021-01-18 DIAGNOSIS — N189 Chronic kidney disease, unspecified: Secondary | ICD-10-CM

## 2021-01-18 DIAGNOSIS — C50911 Malignant neoplasm of unspecified site of right female breast: Secondary | ICD-10-CM

## 2021-01-18 DIAGNOSIS — M835 Other drug-induced osteomalacia in adults: Secondary | ICD-10-CM

## 2021-01-18 DIAGNOSIS — D518 Other vitamin B12 deficiency anemias: Secondary | ICD-10-CM

## 2021-01-18 DIAGNOSIS — Z87891 Personal history of nicotine dependence: Secondary | ICD-10-CM | POA: Diagnosis not present

## 2021-01-18 DIAGNOSIS — C50121 Malignant neoplasm of central portion of right male breast: Secondary | ICD-10-CM

## 2021-01-18 DIAGNOSIS — Z170 Estrogen receptor positive status [ER+]: Secondary | ICD-10-CM

## 2021-01-18 NOTE — Progress Notes (Signed)
[address] [place], [place] [address]   CLINIC:  Medical Oncology/Hematology  PCP:  [facility], [place] / [name] [address] [phone]   REASON FOR VISIT:  Follow-up for right breast cancer  PRIOR THERAPY: Right simple mastectomy on 03/08/2011  NGS Results: ER positive, HER-2 negative  CURRENT THERAPY: Surveillance  BRIEF ONCOLOGIC HISTORY:  Oncology History  Invasive ductal carcinoma of right breast, stage 2- 2012 s/p chemo and mastectomy  12/14/2010 Initial Diagnosis   Right needle core biopsy at 9 o'clock position positive for invasive ductal carcinoma   01/17/2011 Surgery   Right simple mastectomy showing a 2.1 cm invasive ductal carcinoma, clear marhins, 0/2 lymph nodes.  ER 96%, PR 84%, Ki-67 19%, Her2 negative.   03/08/2011 - 03/09/2016 Chemotherapy   Tamoxifen x 5 years   08/17/2014 Genetic Testing   Patient has genetic testing done forBreast/Ovarian cancer panel through [facility] . 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    08/09/2015 Pathology Results   BCI- Low risk of late recurrence years 5-10 (3.7%), low likelihood of benefit from extended endocrine therapy, low risk fo overall recurrence years 0-10 (6.6%), and low liklihood of benefit from extended endocrine therapy.   Hodgkin's [id number]  04/27/2011 Initial Diagnosis   Hodgkin's [id number]     CANCER STAGING: Cancer Staging Invasive ductal carcinoma of right breast, stage 2- 2012 s/p chemo and mastectomy Staging form: Breast, AJCC 7th Edition - Clinical: Stage IIA (T2, N0, cM0) - Signed by Baird Cancer, PA on 04/27/2011   INTERVAL HISTORY:  Mr. LADISLAO COHENOUR, a 70 y.o. male, returns for routine follow-up of his right breast cancer. Kedar was last seen by Francene Finders, NP, on 01/15/2020.   Today he reports feeling okay. He denies having any new pains. He finished taking  tamoxifen in 2017.   REVIEW OF SYSTEMS:  Review of Systems  Constitutional: Positive for fatigue (25%). Negative for appetite change.  Respiratory: Positive for shortness of breath (w/ exertion).   Gastrointestinal: Positive for blood in stool (d/t hemorrhoids).  Musculoskeletal: Negative for arthralgias and myalgias.  Neurological: Positive for light-headedness (occasional).  All other systems reviewed and are negative.   PAST MEDICAL/SURGICAL HISTORY:  Past Medical History:  Diagnosis Date  . Adie's pupil   . Anemia 07/14/2013  . Borderline diabetes   . CAD (coronary artery disease), native coronary artery 04/09/2013   Dist LM stenosis 70-80% (at trifurcation into LAD, CX, & 2 Ramus branches, 70% mid RCA);; b) Myoview 08/06/14: Low Risk, no Ischemia/infarction  . Depression 1983   "after I was dx'd w/CA; now just comes in spells"  (12/22/2015)  . Essential hypertension   . Exertional shortness of breath   . Hodgkin's lymphoma ([place]) 1983  . Hypercholesterolemia   . Hypothyroidism   . Invasive ductal carcinoma of breast, stage 2 ([place]) 04/27/2011   chemo, mastectomy  . Left carotid artery partial occlusion 04/27/2011   CAROTIC DOPPLER 11/2013: < 40% R ICA stenosis, distal waveforms damped - suggests probably intracranial occlusoin, < 88% LICA, normal vertebrals -- no change  . Mild aortic stenosis by prior echocardiogram 02/2018   By echo June 2020: Mild-Mod AS (Mean Gradient 13.5 mmHg)  . Mild mitral stenosis by prior echocardiogram 08/06/2014   Most recent echo June 2020: Estimated valve area 2.83 cm (in 2019 was roughly 2 cm).  Mean gradient 6-8 mmHg.  Appears to be stable.  Marland Kitchen  Personal history of chemotherapy   . Restless leg syndrome   . Right carotid artery occlusion 04/27/2011  . S/P CABG x 3 04/09/2013   LIMA-LAD, fLRAD-RI, SVG-RCA; Echo 10/29/'15:  mild Conc LVH, no WMA, Gr 1 DD, Mod AoV Sclerosis w/ Mod AI, ~Mild-Mod MS   . Sleep apnea   . Stroke [facility])    2007  . Thyroid  disease    Past Surgical History:  Procedure Laterality Date  . BIOPSY  09/08/2020   Procedure: BIOPSY;  Surgeon: Rogene Houston, MD;  [facility];  Service: Endoscopy;;  colon  . BREAST BIOPSY Right   . CARDIAC CATHETERIZATION  2014  . CATARACT EXTRACTION W/PHACO Right 06/21/2015   Procedure: CATARACT EXTRACTION PHACO AND INTRAOCULAR LENS PLACEMENT ([place]);  Surgeon: Tonny Branch, MD;  Location: [facility];  Service: Ophthalmology;  Laterality: Right;  CDE: 7.05  . CATARACT EXTRACTION W/PHACO Left 07/01/2015   Procedure: CATARACT EXTRACTION PHACO AND INTRAOCULAR LENS PLACEMENT (I[facility]);  Surgeon: Tonny Branch, MD;  Location: [facility];  Service: Ophthalmology;  Laterality: Left;  CDE: 7.49  . COLONOSCOPY [name] 05/06/2015   Procedure: COLONOSCOPY;  Surgeon: Rogene Houston, MD;  [facility];  Service: Endoscopy;  Laterality: [name];  930  . COLONOSCOPY WITH PROPOFOL [name] 09/08/2020   Procedure: COLONOSCOPY WITH PROPOFOL;  Surgeon: Rogene Houston, MD;  [facility];  Service: Endoscopy;  Laterality: [name];  915  . CORONARY ARTERY BYPASS GRAFT [name] 04/08/2013   Procedure: CORONARY ARTERY BYPASS GRAFTING (CABG);  Surgeon: Ivin Poot, MD;  Location: [facility];  Service: Open Heart Surgery;  Laterality: [name];  Times 3 using left internal mammary artery; endoscopically harvested right saphenous vein and left radial artery  . INTRAOPERATIVE TRANSESOPHAGEAL ECHOCARDIOGRAM [name] 04/08/2013   Procedure: INTRAOPERATIVE TRANSESOPHAGEAL ECHOCARDIOGRAM;  Surgeon: Ivin Poot, MD;  Location: [facility];  Service: Open Heart Surgery;  Laterality: [name];  . LAPAROSCOPIC APPENDECTOMY [name] 12/23/2015   Procedure: APPENDECTOMY LAPAROSCOPIC;  Surgeon: Donnie Mesa, MD;  Location: [facility];  Service: General;  Laterality: [name];  . LEFT HEART CATHETERIZATION WITH CORONARY ANGIOGRAM [name] 04/07/2013   Procedure: LEFT HEART CATHETERIZATION WITH CORONARY ANGIOGRAM;  Surgeon: Leonie Man, MD;  Location: [facility] CATH LAB;   Service: Cardiovascular;  Laterality: [name];  . LYMPH NODE BIOPSY Left 1983   neck  . MASTECTOMY Right    w/axillary lymph node dissection  . NM MYOVIEW LTD  08/2019    EF 55 to 60%.  LOW RISK.  No ischemia or infarction noted.  Marland Kitchen NM TREADMILL MYOVIEW LTD  08/06/2014   Exercise 5:07 min, 6.3 METS; symptoms noted or extreme dyspnea and lightheadedness with some chest tightness. No EKG changes. No ischemia or infarction was normal EF.  Marland Kitchen POLYPECTOMY  09/08/2020   Procedure: POLYPECTOMY;  Surgeon: Rogene Houston, MD;  [facility];  Service: Endoscopy;;  . RADIAL ARTERY HARVEST Left 04/08/2013   Procedure: RADIAL ARTERY HARVEST;  Surgeon: Ivin Poot, MD;  Location: [facility];  Service: Open Heart Surgery;  Laterality: Left;  . TONSILLECTOMY  1974  . TRANSTHORACIC ECHOCARDIOGRAM  08/06/2014; 02/2018   a) Nl EF 55-60%. Gr 1 DD-high LVEDP. Mod AoV Sclerosis w/o AS. Mild AI; mild MS - mean grad 7 mmHg @ HR 122 bpm.;; b) Initial: EF 55-60%, GRII DD.  HighLAP/LVEDP.  ? Bicuspid AoV -severely Ca2+.  Mild Ao root dilation.  Severe MAC, mild MS (MVA by P1/2 T & continuity equation ~2 cm).  Mod RV dilation w/ mild- mod reduced EF.---> Limited w/Defiinity: No RWMA, Mild AS, Mod MS (~mean gradient 8 mmHg  . TRANSTHORACIC ECHOCARDIOGRAM  02/2020   Normal LV size and function.  EF 65 to 70% no R WMA.  Mild LVH with basal septum.  GRII DD with elevated LVEDP.  Moderate reduced RV function with mild RV dilation, but normal PA pressures.  Mild LA dilation.  Moderate mitral stenosis, moderate AS & AI.  Mild MR, Mod MS (mean gradient 9 mmHg).  Moderate calcified AS w/ Mod AI -> Mean gradient 17.3 mmHg with peak 32 mm.  . TRANSTHORACIC ECHOCARDIOGRAM  03/2019    EF 60-65%, mild concentric LVH. Gr 2 DD. No RWMA. Mild-Mod RV dilation & enlargement. Mild LA dilation. Mod MV Calcification . Mod MS (previously read as mild, but mean gradient is lower than and estimated valve area is higher than last check).   Mild-Mod AS (Mean Gradient 13.5 mmHg)    SOCIAL HISTORY:  Social History   Socioeconomic History  . Marital status: Married    Spouse name: Not on file  . Number of children: 2  . Years of education: Not on file  . Highest education level: Not on file  Occupational History  . Not on file  Tobacco Use  . Smoking status: Former Smoker    Packs/day: 1.00    Years: 15.00    Pack years: 15.00    Types: Cigarettes    Quit date: 06/09/1982    Years since quitting: 38.6  . Smokeless tobacco: Never Used  Vaping Use  . Vaping Use: Never used  Substance and Sexual Activity  . Alcohol use: No  . Drug use: No  . Sexual activity: Never  Other Topics Concern  . Not on file  Social History Narrative   Married. Former smoker who quit in 1983.   No routine exercise - gained ~40lb post CABG.   Social Determinants of Health   Financial Resource Strain: Not on file  Food Insecurity: Not on file  Transportation Needs: Not on file  Physical Activity: Not on file  Stress: Not on file  Social Connections: Not on file  Intimate Partner Violence: Not on file    FAMILY HISTORY:  Family History  Problem Relation Age of Onset  . Alzheimer's disease Mother   . Stroke Mother   . Hypertension Mother   . Hyperlipidemia Mother   . Diabetes Mother   . Hypertension Father   . Diabetes Paternal Grandmother   . Diabetes Paternal Grandfather   . Breast cancer Paternal Aunt   . Ovarian cancer Paternal Aunt     CURRENT MEDICATIONS:  Current Outpatient Medications  Medication Sig Dispense Refill  . Black Pepper-Turmeric (TURMERIC CURCUMIN) 02-999 MG CAPS Take 1,000 mg by mouth 2 (two) times daily.    Marland Kitchen DEVILS CLAW PO Take 960 mg by mouth daily.    Marland Kitchen acetaminophen (TYLENOL) 500 MG tablet Take 500 mg by mouth every 6 (six) hours as needed for mild pain.     Marland Kitchen albuterol (PROVENTIL HFA;VENTOLIN HFA) 108 (90 Base) MCG/ACT inhaler Inhale 2 puffs into the lungs every 6 (six) hours as needed for  wheezing or shortness of breath.    Marland Kitchen albuterol (PROVENTIL) 2 MG tablet Take 2 mg by mouth daily.    Marland Kitchen ALPRAZolam (XANAX) 1 MG tablet Take 0.5-1 mg by mouth at bedtime.     . Artificial Tear Solution (SOOTHE XP OP) Place 1 drop into both eyes daily as  needed (dry eyes).    Marland Kitchen aspirin EC 81 MG tablet Take 1 tablet (81 mg total) by mouth daily. 90 tablet 3  . Cholecalciferol (VITAMIN D3) 250 MCG (10000 UT) capsule Take 10,000 Units by mouth daily.    . citalopram (CELEXA) 20 MG tablet Take 40 mg by mouth every evening.     . diphenhydrAMINE (BENADRYL) 25 MG tablet Take 25 mg by mouth daily as needed for allergies.    . Ferrous Gluconate (IRON 27 PO) Take 27 mg by mouth daily.    . Garlic 7017 MG CAPS Take 1,000 mg by mouth daily.    . Ginkgo Biloba 120 MG CAPS Take 120 mg by mouth daily.    Marland Kitchen levothyroxine (SYNTHROID, LEVOTHROID) 75 MCG tablet Take 75 mcg by mouth daily before breakfast.     . nitroGLYCERIN (NITROSTAT) 0.4 MG SL tablet Place 1 tablet (0.4 mg total) under the tongue every 5 (five) minutes as needed for chest pain. 25 tablet 12  . Omega-3 Fatty Acids (SALMON OIL-1000 PO) Take 1,000 mg by mouth daily.    Marland Kitchen OVER THE COUNTER MEDICATION Take 1 tablet by mouth daily. Ultra kidney complex    . OVER THE COUNTER MEDICATION Take 1 tablet by mouth daily. Liver 10 complex otc supplement    . rOPINIRole (REQUIP) 4 MG tablet Take 4 mg by mouth See admin instructions. Take 4 mg every night, take an additional 4 mg in the morning 3 times weekly    . simvastatin (ZOCOR) 80 MG tablet Take 80 mg by mouth every evening.     . vitamin B-12 (CYANOCOBALAMIN) 500 MCG tablet Take 500 mcg by mouth daily.     No current facility-administered medications for this visit.    ALLERGIES:  No Known Allergies  PHYSICAL EXAM:  Performance status (ECOG): 1 - Symptomatic but completely ambulatory  Vitals:   01/18/21 1125  BP: (!) 91/56  Pulse: 71  Resp: 19  Temp: (!) 97.1 F (36.2 C)  SpO2: 97%   Wt  Readings from Last 3 Encounters:  01/18/21 231 lb 9.6 oz (105.1 kg)  09/08/20 233 lb 11 oz (106 kg)  09/06/20 234 lb 14.4 oz (106.5 kg)   Physical Exam Vitals reviewed.  Constitutional:      Appearance: Normal appearance.  Cardiovascular:     Rate and Rhythm: Normal rate and regular rhythm.     Pulses: Normal pulses.     Heart sounds: Normal heart sounds.  Pulmonary:     Effort: Pulmonary effort is normal.     Breath sounds: Normal breath sounds.  Chest:  Breasts:     Right: Absent. No bleeding, mass, skin change, tenderness, axillary adenopathy or supraclavicular adenopathy.     Left: Normal. No swelling, bleeding, inverted nipple, mass, nipple discharge, skin change, tenderness, axillary adenopathy or supraclavicular adenopathy.    Abdominal:     Palpations: Abdomen is soft. There is no hepatomegaly, splenomegaly or mass.     Tenderness: There is no abdominal tenderness.     Hernia: No hernia is present.  Lymphadenopathy:     Upper Body:     Right upper body: No supraclavicular, axillary or pectoral adenopathy.     Left upper body: No supraclavicular, axillary or pectoral adenopathy.  Neurological:     General: No focal deficit present.     Mental Status: He is alert and oriented to person, place, and time.  Psychiatric:        Mood and Affect: Mood normal.  Behavior: Behavior normal.      LABORATORY DATA:  I have reviewed the labs as listed.  CBC Latest Ref Rng & Units 01/11/2021 09/07/2020 01/06/2020  WBC 4.0 - 10.5 K/uL 6.2 7.7 6.8  Hemoglobin 13.0 - 17.0 g/dL 13.3 14.5 12.7(L)  Hematocrit 39.0 - 52.0 % 42.1 45.4 40.9  Platelets 150 - 400 K/uL 190 224 191   CMP Latest Ref Rng & Units 01/11/2021 09/07/2020 01/06/2020  Glucose 70 - 99 mg/dL 132(H) 123(H) 210(H)  BUN 8 - 23 mg/dL _0 Creatinine 0.61 - 1.24 mg/dL 1.41(H) 1.36(H) 1.27(H)  Sodium 135 - 145 mmol/L 138 138 140  Potassium 3.5 - 5.1 mmol/L 4.5 4.6 4.4  Chloride 98 - 111 mmol/L 104 102 107  CO2  22 - 32 mmol/L _1 Calcium 8.9 - 10.3 mg/dL 8.8(L) 9.2 8.6(L)  Total Protein 6.5 - 8.1 g/dL 6.8 - 6.1(L)  Total Bilirubin 0.3 - 1.2 mg/dL 0.9 - 0.6  Alkaline Phos 38 - 126 U/L 68 - 68  AST 15 - 41 U/L 34 - 35  ALT 0 - 44 U/L 42 - 40   Lab Results  Component Value Date   LDH 168 01/11/2021   LDH 191 01/06/2020   LDH 145 12/31/2018   Lab Results  Component Value Date   VD25OH 70.82 01/11/2021   VD25OH 53.86 01/06/2020    DIAGNOSTIC IMAGING:  I have independently reviewed the scans and discussed with the patient. MM DIAG BREAST TOMO UNI LEFT  Result Date: 01/11/2021 CLINICAL DATA:  70 year old male with history of right breast cancer post mastectomy 2012. Remote history of chest radiation for lymphoma. Patient presents for annual examination of the left breast. EXAM: DIGITAL DIAGNOSTIC UNILATERAL LEFT MAMMOGRAM WITH TOMOSYNTHESIS AND CAD TECHNIQUE: Left digital diagnostic mammography and breast tomosynthesis was performed. The images were evaluated with computer-aided detection. COMPARISON:  Previous exams. ACR Breast Density Category a: The breast tissue is almost entirely fatty. FINDINGS: No suspicious masses or calcifications seen in the left breast. Stable appearance of mild gynecomastia. There is no mammographic evidence of malignancy. IMPRESSION: No mammographic evidence of malignancy in the left breast. RECOMMENDATION: Annual diagnostic mammography of the left breast. I have discussed the findings and recommendations with the patient. If applicable, a reminder letter will be sent to the patient regarding the next appointment. BI-RADS CATEGORY  1: Negative. Electronically Signed   By: Everlean Alstrom M.D.   On: 01/11/2021 09:24     ASSESSMENT:  1.  Stage II invasive ductal carcinoma the right breast: - He was initially diagnosed 12/14/2010 with invasive ductal carcinoma the right breast ER+/HER2- - Had a biopsy done on 01/17/2011. - He had a right simple mastectomy on 03/08/2011  showing a 2.1 cm invasive ductal carcinoma with clear margins, 0 out of 2 lymph nodes.  ER 96%, PR 84%, Ki-67 19%, HER-2 negative. - He completed his chemotherapy and started tamoxifen in May 2012 and completed May 2017. - Oncotype score was 14. -Last mammogram on 01/06/2020 was BI-RADS category 2 benign. -Completed 5 years of tamoxifen in 2017.  2.  Hodgkin lymphoma: - He was treated in the early 80s with mantle radiation. - He was diagnosed with a right lymph node biopsy.    PLAN:  1.  Stage II invasive ductal carcinoma the right breast: -Right mastectomy site is within normal limits.  No palpable adenopathy.  No palpable mass in the left breast. -Reviewed labs from 01/11/2021 which showed normal LFTs and CBC. -  Reviewed results of left breast mammogram from 01/11/2021, BI-RADS Category 1. -Recommend left breast mammogram in 1 year.  2.  Hodgkin lymphoma: -No B symptoms or palpable adenopathy.  No cytopenias.  3.  CKD: -Creatinine is 1.4 and stable.    Orders placed this encounter:  No orders of the defined types were placed in this encounter.    Derek Jack, MD [place] [phone]   I, Milinda Antis, am acting as a scribe for Dr. Sanda Linger.  I, Derek Jack MD, have reviewed the above documentation for accuracy and completeness, and I agree with the above.

## 2021-01-18 NOTE — Patient Instructions (Addendum)
[facility] at [facility] Discharge Instructions  You were seen today by Dr. Delton Coombes. He went over your recent results and scans. You will be scheduled to have a left mammogram after April 5th, 2023. Dr. Delton Coombes will see you back in 1 year for labs and follow up.   Thank you for choosing [place] at [facility] to provide your oncology and hematology care.  To afford each patient quality time with our provider, please arrive at least 15 minutes before your scheduled appointment time.   If you have a lab appointment with the [facility] please come in thru the Main Entrance and check in at the main information desk  You need to re-schedule your appointment should you arrive 10 or more minutes late.  We strive to give you quality time with our providers, and arriving late affects you and other patients whose appointments are after yours.  Also, if you no show three or more times for appointments you may be dismissed from the clinic at the providers discretion.     Again, thank you for choosing [facility].  Our hope is that these requests will decrease the amount of time that you wait before being seen by our physicians.       _____________________________________________________________  Should you have questions after your visit to [facility], please contact our office at [phone] between the hours of 8:00 a.m. and 4:30 p.m.  Voicemails left after 4:00 p.m. will not be returned until the following business day.  For prescription refill requests, have your pharmacy contact our office and allow 72 hours.    Cancer Center Support Programs:   > Cancer Support Group  2nd Tuesday of the month 1pm-2pm, Journey Room

## 2021-01-20 DIAGNOSIS — F329 Major depressive disorder, single episode, unspecified: Secondary | ICD-10-CM | POA: Diagnosis not present

## 2021-01-20 DIAGNOSIS — Z0000 Encounter for general adult medical examination without abnormal findings: Secondary | ICD-10-CM | POA: Diagnosis not present

## 2021-01-20 DIAGNOSIS — E119 Type 2 diabetes mellitus without complications: Secondary | ICD-10-CM | POA: Diagnosis not present

## 2021-01-20 DIAGNOSIS — Z0001 Encounter for general adult medical examination with abnormal findings: Secondary | ICD-10-CM | POA: Diagnosis not present

## 2021-01-20 DIAGNOSIS — I10 Essential (primary) hypertension: Secondary | ICD-10-CM | POA: Diagnosis not present

## 2021-01-20 DIAGNOSIS — R351 Nocturia: Secondary | ICD-10-CM | POA: Diagnosis not present

## 2021-01-20 DIAGNOSIS — F419 Anxiety disorder, unspecified: Secondary | ICD-10-CM | POA: Diagnosis not present

## 2021-01-20 DIAGNOSIS — Z6828 Body mass index (BMI) 28.0-28.9, adult: Secondary | ICD-10-CM | POA: Diagnosis not present

## 2021-01-20 DIAGNOSIS — Z1389 Encounter for screening for other disorder: Secondary | ICD-10-CM | POA: Diagnosis not present

## 2021-01-20 DIAGNOSIS — E7849 Other hyperlipidemia: Secondary | ICD-10-CM | POA: Diagnosis not present

## 2021-01-20 DIAGNOSIS — E063 Autoimmune thyroiditis: Secondary | ICD-10-CM | POA: Diagnosis not present

## 2021-01-20 DIAGNOSIS — E663 Overweight: Secondary | ICD-10-CM | POA: Diagnosis not present

## 2021-01-20 DIAGNOSIS — R5383 Other fatigue: Secondary | ICD-10-CM | POA: Diagnosis not present

## 2021-01-20 DIAGNOSIS — C50921 Malignant neoplasm of unspecified site of right male breast: Secondary | ICD-10-CM | POA: Diagnosis not present

## 2021-01-20 DIAGNOSIS — I2510 Atherosclerotic heart disease of native coronary artery without angina pectoris: Secondary | ICD-10-CM | POA: Diagnosis not present

## 2021-01-20 DIAGNOSIS — N182 Chronic kidney disease, stage 2 (mild): Secondary | ICD-10-CM | POA: Diagnosis not present

## 2021-01-20 DIAGNOSIS — E1129 Type 2 diabetes mellitus with other diabetic kidney complication: Secondary | ICD-10-CM | POA: Diagnosis not present

## 2021-04-04 DIAGNOSIS — U071 COVID-19: Secondary | ICD-10-CM | POA: Diagnosis not present

## 2021-07-28 DIAGNOSIS — Z23 Encounter for immunization: Secondary | ICD-10-CM | POA: Diagnosis not present

## 2021-08-23 ENCOUNTER — Other Ambulatory Visit: Payer: Self-pay

## 2021-08-23 DIAGNOSIS — I6523 Occlusion and stenosis of bilateral carotid arteries: Secondary | ICD-10-CM

## 2021-09-08 ENCOUNTER — Encounter: Payer: Self-pay | Admitting: Physician Assistant

## 2021-09-08 ENCOUNTER — Ambulatory Visit (INDEPENDENT_AMBULATORY_CARE_PROVIDER_SITE_OTHER): Payer: Medicare Other | Admitting: Physician Assistant

## 2021-09-08 ENCOUNTER — Other Ambulatory Visit: Payer: Self-pay

## 2021-09-08 ENCOUNTER — Ambulatory Visit (HOSPITAL_COMMUNITY)
Admission: RE | Admit: 2021-09-08 | Discharge: 2021-09-08 | Disposition: A | Discharge status: Home / Self Care | Payer: Medicare Other | Source: Ambulatory Visit | Attending: Vascular Surgery | Admitting: Vascular Surgery

## 2021-09-08 VITALS — BP 162/71 | HR 76 | Temp 98.2°F | Resp 20 | Ht 75.0 in | Wt 231.5 lb

## 2021-09-08 DIAGNOSIS — I6523 Occlusion and stenosis of bilateral carotid arteries: Secondary | ICD-10-CM

## 2021-09-08 NOTE — Progress Notes (Signed)
History of Present Illness:  Patient is a 70 y.o. year old male who presents for evaluation of carotid stenosis.  The patient has a known right internal carotid artery occlusion.  He suffered a right hemispheric stroke in 2007 that was associated with left-sided weakness.  The patient denies symptoms of TIA, amaurosis, or stroke.  The patient is currently on ASA antiplatelet therapy.      Past Medical History:  Diagnosis Date   Adie's pupil    Anemia 07/14/2013   Borderline diabetes    CAD (coronary artery disease), native coronary artery 04/09/2013   Dist LM stenosis 70-80% (at trifurcation into LAD, CX, & 2 Ramus branches, 70% mid RCA);; b) Myoview 08/06/14: Low Risk, no Ischemia/infarction   Depression 1983   "after I was dx'd w/CA; now just comes in spells"  (12/22/2015)   Essential hypertension    Exertional shortness of breath    Hodgkin's lymphoma ([place]) 1983   Hypercholesterolemia    Hypothyroidism    Invasive ductal carcinoma of breast, stage 2 ([place]) 04/27/2011   chemo, mastectomy   Left carotid artery partial occlusion 04/27/2011   CAROTIC DOPPLER 11/2013: < 40% R ICA stenosis, distal waveforms damped - suggests probably intracranial occlusoin, < 22% LICA, normal vertebrals -- no change   Mild aortic stenosis by prior echocardiogram 02/2018   By echo June 2020: Mild-Mod AS (Mean Gradient 13.5 mmHg)   Mild mitral stenosis by prior echocardiogram 08/06/2014   Most recent echo June 2020: Estimated valve area 2.83 cm (in 2019 was roughly 2 cm).  Mean gradient 6-8 mmHg.  Appears to be stable.   Personal history of chemotherapy    Restless leg syndrome    Right carotid artery occlusion 04/27/2011   S/P CABG x 3 04/09/2013   LIMA-LAD, fLRAD-RI, SVG-RCA; Echo 10/29/'15:  mild Conc LVH, no WMA, Gr 1 DD, Mod AoV Sclerosis w/ Mod AI, ~Mild-Mod MS    Sleep apnea    Stroke [facility])    2007   Thyroid disease     Past Surgical History:  Procedure Laterality Date   BIOPSY  09/08/2020    Procedure: BIOPSY;  Surgeon: Rogene Houston, MD;  [facility];  Service: Endoscopy;;  colon   BREAST BIOPSY Right    CARDIAC CATHETERIZATION  2014   CATARACT EXTRACTION W/PHACO Right 06/21/2015   Procedure: CATARACT EXTRACTION PHACO AND INTRAOCULAR LENS PLACEMENT ([place]);  Surgeon: Tonny Branch, MD;  Location: [facility];  Service: Ophthalmology;  Laterality: Right;  CDE: 7.05   CATARACT EXTRACTION W/PHACO Left 07/01/2015   Procedure: CATARACT EXTRACTION PHACO AND INTRAOCULAR LENS PLACEMENT ([facility]);  Surgeon: Tonny Branch, MD;  Location: [facility];  Service: Ophthalmology;  Laterality: Left;  CDE: 7.49   COLONOSCOPY [name] 05/06/2015   Procedure: COLONOSCOPY;  Surgeon: Rogene Houston, MD;  [facility];  Service: Endoscopy;  Laterality: [name];  930   COLONOSCOPY WITH PROPOFOL [name] 09/08/2020   Procedure: COLONOSCOPY WITH PROPOFOL;  Surgeon: Rogene Houston, MD;  [facility];  Service: Endoscopy;  Laterality: [name];  915   CORONARY ARTERY BYPASS GRAFT [name] 04/08/2013   Procedure: CORONARY ARTERY BYPASS GRAFTING (CABG);  Surgeon: Ivin Poot, MD;  Location: [place];  Service: Open Heart Surgery;  Laterality: [name];  Times 3 using left internal mammary artery; endoscopically harvested right saphenous vein and left radial artery   INTRAOPERATIVE TRANSESOPHAGEAL ECHOCARDIOGRAM [name] 04/08/2013   Procedure: INTRAOPERATIVE TRANSESOPHAGEAL ECHOCARDIOGRAM;  Surgeon: Ivin Poot, MD;  [facility]: [facility];  Service: Open Heart Surgery;  Laterality: [name];   LAPAROSCOPIC APPENDECTOMY [name] 12/23/2015   Procedure: APPENDECTOMY LAPAROSCOPIC;  Surgeon: Donnie Mesa, MD;  Location: [place];  Service: General;  Laterality: [name];   LEFT HEART CATHETERIZATION WITH CORONARY ANGIOGRAM [name] 04/07/2013   Procedure: LEFT HEART CATHETERIZATION WITH CORONARY ANGIOGRAM;  Surgeon: Leonie Man, MD;  Location: [facility] CATH LAB;  Service: Cardiovascular;  Laterality: [name];   LYMPH NODE BIOPSY Left 1983   neck   MASTECTOMY Right     w/axillary lymph node dissection   NM MYOVIEW LTD  08/2019    EF 55 to 60%.  LOW RISK.  No ischemia or infarction noted.   NM TREADMILL MYOVIEW LTD  08/06/2014   Exercise 5:07 min, 6.3 METS; symptoms noted or extreme dyspnea and lightheadedness with some chest tightness. No EKG changes. No ischemia or infarction was normal EF.   POLYPECTOMY  09/08/2020   Procedure: POLYPECTOMY;  Surgeon: Rogene Houston, MD;  [facility];  Service: Endoscopy;;   RADIAL ARTERY HARVEST Left 04/08/2013   Procedure: RADIAL ARTERY HARVEST;  Surgeon: Ivin Poot, MD;  Location: [place];  Service: Open Heart Surgery;  Laterality: Left;   TONSILLECTOMY  1974   TRANSTHORACIC ECHOCARDIOGRAM  08/06/2014; 02/2018   a) Nl EF 55-60%. Gr 1 DD-high LVEDP. Mod AoV Sclerosis w/o AS. Mild AI; mild MS - mean grad 7 mmHg @ HR 122 bpm.;; b) Initial: EF 55-60%, GRII DD.  HighLAP/LVEDP.  ? Bicuspid AoV -severely Ca2+.  Mild Ao root dilation.  Severe MAC, mild MS (MVA by P1/2 T & continuity equation ~2 cm).  Mod RV dilation w/ mild- mod reduced EF.---> Limited w/Defiinity: No RWMA, Mild AS, Mod MS (~mean gradient 8 mmHg   TRANSTHORACIC ECHOCARDIOGRAM  02/2020   Normal LV size and function.  EF 65 to 70% no R WMA.  Mild LVH with basal septum.  GRII DD with elevated LVEDP.  Moderate reduced RV function with mild RV dilation, but normal PA pressures.  Mild LA dilation.  Moderate mitral stenosis, moderate AS & AI.  Mild MR, Mod MS (mean gradient 9 mmHg).  Moderate calcified AS w/ Mod AI -> Mean gradient 17.3 mmHg with peak 32 mm.   TRANSTHORACIC ECHOCARDIOGRAM  03/2019    EF 60-65%, mild concentric LVH. Gr 2 DD. No RWMA. Mild-Mod RV dilation & enlargement. Mild LA dilation. Mod MV Calcification . Mod MS (previously read as mild, but mean gradient is lower than and estimated valve area is higher than last check).  Mild-Mod AS (Mean Gradient 13.5 mmHg)     Social History Social History   Tobacco Use   Smoking status: Former     Packs/day: 1.00    Years: 15.00    Pack years: 15.00    Types: Cigarettes    Quit date: 06/09/1982    Years since quitting: 39.2   Smokeless tobacco: Never  Vaping Use   Vaping Use: Never used  Substance Use Topics   Alcohol use: No   Drug use: No    Family History Family History  Problem Relation Age of Onset   Alzheimer's disease Mother    Stroke Mother    Hypertension Mother    Hyperlipidemia Mother    Diabetes Mother    Hypertension Father    Diabetes Paternal Grandmother    Diabetes Paternal Grandfather    Breast cancer Paternal Aunt    Ovarian cancer Paternal Aunt     Allergies  No  Known Allergies   Current Outpatient Medications  Medication Sig Dispense Refill   acetaminophen (TYLENOL) 500 MG tablet Take 500 mg by mouth every 6 (six) hours as needed for mild pain.      albuterol (PROVENTIL HFA;VENTOLIN HFA) 108 (90 Base) MCG/ACT inhaler Inhale 2 puffs into the lungs every 6 (six) hours as needed for wheezing or shortness of breath.     albuterol (PROVENTIL) 2 MG tablet Take 2 mg by mouth daily.     ALPRAZolam (XANAX) 1 MG tablet Take 0.5-1 mg by mouth at bedtime.     Artificial Tear Solution (SOOTHE XP OP) Place 1 drop into both eyes daily as needed (dry eyes).     aspirin EC 81 MG tablet Take 1 tablet (81 mg total) by mouth daily. 90 tablet 3   Black Pepper-Turmeric (TURMERIC CURCUMIN) 02-999 MG CAPS Take 1,000 mg by mouth 2 (two) times daily.     Cholecalciferol (VITAMIN D3) 250 MCG (10000 UT) capsule Take 10,000 Units by mouth daily.     citalopram (CELEXA) 20 MG tablet Take 40 mg by mouth every evening.      DEVILS CLAW PO Take 960 mg by mouth daily.     diphenhydrAMINE (BENADRYL) 25 MG tablet Take 25 mg by mouth daily as needed for allergies.     Ferrous Gluconate (IRON 27 PO) Take 27 mg by mouth daily.     Garlic 8115 MG CAPS Take 1,000 mg by mouth daily.     Ginkgo Biloba 120 MG CAPS Take 120 mg by mouth daily.     levothyroxine (SYNTHROID, LEVOTHROID) 75  MCG tablet Take 75 mcg by mouth daily before breakfast.      nitroGLYCERIN (NITROSTAT) 0.4 MG SL tablet Place 1 tablet (0.4 mg total) under the tongue every 5 (five) minutes as needed for chest pain. 25 tablet 12   Omega-3 Fatty Acids (SALMON OIL-1000 PO) Take 1,000 mg by mouth daily.     OVER THE COUNTER MEDICATION Take 1 tablet by mouth daily. Ultra kidney complex     OVER THE COUNTER MEDICATION Take 1 tablet by mouth daily. Liver 10 complex otc supplement     rOPINIRole (REQUIP) 4 MG tablet Take 4 mg by mouth See admin instructions. Take 4 mg every night, take an additional 4 mg in the morning 3 times weekly     simvastatin (ZOCOR) 80 MG tablet Take 80 mg by mouth every evening.      vitamin B-12 (CYANOCOBALAMIN) 500 MCG tablet Take 500 mcg by mouth daily.     No current facility-administered medications for this visit.    ROS:   General:  No weight loss, Fever, chills  HEENT: No recent headaches, no nasal bleeding, no visual changes, no sore throat  Neurologic: No dizziness, blackouts, seizures. No recent symptoms of stroke or mini- stroke. No recent episodes of slurred speech, or temporary blindness.  Cardiac: No recent episodes of chest pain/pressure, no shortness of breath at rest.  No shortness of breath with exertion.  Denies history of atrial fibrillation or irregular heartbeat  Vascular: No history of rest pain in feet.  No history of claudication.  No history of non-healing ulcer, No history of DVT   Pulmonary: No home oxygen, no productive cough, no hemoptysis,  No asthma or wheezing  Musculoskeletal:  _0  Arthritis, _1  Low back pain,  _2  Joint pain  Hematologic:No history of hypercoagulable state.  No history of easy bleeding.  No history of anemia  Gastrointestinal:  No hematochezia or melena,  No gastroesophageal reflux, no trouble swallowing  Urinary: _0  chronic Kidney disease, _1  on HD - _2  MWF or _3  TTHS, _4  Burning with urination, _5  Frequent urination, _6   Difficulty urinating;   Skin: No rashes  Psychological: No history of anxiety,  No history of depression   Physical Examination  Vitals:   09/08/21 1012 09/08/21 1018  BP: (!) 155/76 (!) 162/71  Pulse: 76   Resp: 20   Temp: 98.2 F (36.8 C)   TempSrc: Temporal   SpO2: 99%   Weight: 231 lb 8 oz (105 kg)   Height: _7  (1.905 m)     Body mass index is 28.94 kg/m.  General:  Alert and oriented, no acute distress HEENT: Normal, left pupil > dilated known  Neck: No bruit or JVD Pulmonary: Clear to auscultation bilaterally Cardiac: Regular Rate and Rhythm without murmur Gastrointestinal: Soft, non-tender, non-distended, no mass, no scars Skin: No rash Extremity Pulses:  2+ radial, brachial, femoral, doppler signals brisk dorsalis pedis, posterior tibial pulses bilaterally Musculoskeletal: No deformity or edema  Neurologic: Upper and lower extremity motor 5/5 and symmetric  DATA:   Right Carotid Findings:  +----------+--------+--------+--------+------------------+-----------------  --+            PSV cm/sEDV cm/sStenosisPlaque DescriptionComments              +----------+--------+--------+--------+------------------+-----------------  --+  CCA Prox  85      0                                 High resistant  flow  +----------+--------+--------+--------+------------------+-----------------  --+  CCA Mid   122     0       <50%    smooth            High resistant  flow  +----------+--------+--------+--------+------------------+-----------------  --+  CCA Distal90      0       <50%    smooth            High resistant  flow  +----------+--------+--------+--------+------------------+-----------------  --+  ICA Prox  108     7                                 Trickle flow          +----------+--------+--------+--------+------------------+-----------------  --+  ICA Mid   28      0                                 High resistant   flow  +----------+--------+--------+--------+------------------+-----------------  --+  ICA Distal21      0                                 High resistant  flow  +----------+--------+--------+--------+------------------+-----------------  --+  ECA       176     0                                                       +----------+--------+--------+--------+------------------+-----------------  --+   +----------+--------+-------+----------------+-------------------+  PSV cm/sEDV cmsDescribe        Arm Pressure (mmHG)  +----------+--------+-------+----------------+-------------------+  Subclavian206     0      Multiphasic, WNL                     +----------+--------+-------+----------------+-------------------+   +---------+--------+--+--------+--+---------+  VertebralPSV cm/s75EDV cm/s12Antegrade  +---------+--------+--+--------+--+---------+   ICA Doppler waveforms suggest distal intra cranial obstruction or  occlusion   Left Carotid Findings:  +----------+--------+--------+--------+--------------------------+--------+             PSV cm/sEDV cm/sStenosisPlaque Description          Comments  +----------+--------+--------+--------+--------------------------+--------+   CCA Prox  122     22      <50%    irregular                             +----------+--------+--------+--------+--------------------------+--------+   CCA Mid   138     18              diffuse                               +----------+--------+--------+--------+--------------------------+--------+   CCA Distal141     24                                                    +----------+--------+--------+--------+--------------------------+--------+   ICA Prox  148     26      1-39%   heterogenous and irregular            +----------+--------+--------+--------+--------------------------+--------+   ICA Mid   165     29       1-39%   heterogenous and irregular            +----------+--------+--------+--------+--------------------------+--------+   ICA Distal174     33                                                    +----------+--------+--------+--------+--------------------------+--------+   ECA       155     0                                                     +----------+--------+--------+--------+--------------------------+--------+    +----------+--------+--------+--------+-------------------+            PSV cm/sEDV cm/sDescribeArm Pressure (mmHG)  +----------+--------+--------+--------+-------------------+  Subclavian184     0                                    +----------+--------+--------+--------+-------------------+   +---------+--------+--+--------+--+  VertebralPSV cm/s70EDV cm/s15  +---------+--------+--+--------+--+   Summary:  Right Carotid: High resistant flow with dampening at the distal ICA  consistent                 with distal occlusion  or obstruction. Trickle flow noted in the proximal ICA.   Left Carotid: Velocities in the left ICA are consistent with a 1-39%  stenosis.   Vertebrals:  Bilateral vertebral arteries demonstrate antegrade flow.  Subclavians: Normal flow hemodynamics were seen in bilateral subclavian               arteries.   ASSESSMENT:  Carotid stenosis left with know occlusion on the right He remains asymptomatic for new stroke/TIA. Left Carotid: Velocities in the left ICA are consistent with a 1-39%  stenosis.     PLAN:Continue statin and aspirin.  Follow-up in 1 year with carotid duplex   Roxy Horseman PA-C [facility] Office: [phone]  MD in clinic [place]

## 2021-10-12 DIAGNOSIS — Z6828 Body mass index (BMI) 28.0-28.9, adult: Secondary | ICD-10-CM | POA: Diagnosis not present

## 2021-10-12 DIAGNOSIS — F419 Anxiety disorder, unspecified: Secondary | ICD-10-CM | POA: Diagnosis not present

## 2021-10-12 DIAGNOSIS — E782 Mixed hyperlipidemia: Secondary | ICD-10-CM | POA: Diagnosis not present

## 2021-10-12 DIAGNOSIS — F329 Major depressive disorder, single episode, unspecified: Secondary | ICD-10-CM | POA: Diagnosis not present

## 2021-10-12 DIAGNOSIS — E1129 Type 2 diabetes mellitus with other diabetic kidney complication: Secondary | ICD-10-CM | POA: Diagnosis not present

## 2021-10-12 DIAGNOSIS — Z1331 Encounter for screening for depression: Secondary | ICD-10-CM | POA: Diagnosis not present

## 2021-10-12 DIAGNOSIS — E663 Overweight: Secondary | ICD-10-CM | POA: Diagnosis not present

## 2021-10-12 DIAGNOSIS — Z8673 Personal history of transient ischemic attack (TIA), and cerebral infarction without residual deficits: Secondary | ICD-10-CM | POA: Diagnosis not present

## 2021-10-12 DIAGNOSIS — I10 Essential (primary) hypertension: Secondary | ICD-10-CM | POA: Diagnosis not present

## 2021-10-12 DIAGNOSIS — E039 Hypothyroidism, unspecified: Secondary | ICD-10-CM | POA: Diagnosis not present

## 2021-10-12 DIAGNOSIS — I2510 Atherosclerotic heart disease of native coronary artery without angina pectoris: Secondary | ICD-10-CM | POA: Diagnosis not present

## 2021-11-28 ENCOUNTER — Other Ambulatory Visit (INDEPENDENT_AMBULATORY_CARE_PROVIDER_SITE_OTHER): Payer: Self-pay

## 2021-11-28 ENCOUNTER — Encounter (INDEPENDENT_AMBULATORY_CARE_PROVIDER_SITE_OTHER): Payer: Self-pay

## 2021-11-28 ENCOUNTER — Ambulatory Visit (INDEPENDENT_AMBULATORY_CARE_PROVIDER_SITE_OTHER): Payer: Medicare Other | Admitting: Gastroenterology

## 2021-11-28 ENCOUNTER — Other Ambulatory Visit: Payer: Self-pay

## 2021-11-28 ENCOUNTER — Encounter (INDEPENDENT_AMBULATORY_CARE_PROVIDER_SITE_OTHER): Payer: Self-pay | Admitting: Gastroenterology

## 2021-11-28 DIAGNOSIS — R1310 Dysphagia, unspecified: Secondary | ICD-10-CM | POA: Insufficient documentation

## 2021-11-28 DIAGNOSIS — R1319 Other dysphagia: Secondary | ICD-10-CM

## 2021-11-28 NOTE — Patient Instructions (Signed)
Schedule EGD with ED Eat ground meat only, no chopped meat

## 2021-11-28 NOTE — H&P (View-Only) (Signed)
Ryan [name], M.D. Gastroenterology & Hepatology [facility] [address] [place], [place] [address]  Primary Care Physician: Redmond School, MD [address] [place] [address]  I will communicate my assessment and recommendations to the referring MD via EMR.  Problems: Dysphagia  History of Present Illness: Ryan [name] is a 71 y.o. male with past medical history of coronary artery disease status post CABG, depression, hypertension, Hodgkin's lymphoma diagnosed in 1983 s/ radiotherapy, hyperlipidemia, hypothyroidism, invasive ductal carcinoma of the breast status postmastectomy and chemotherapy, stroke, sleep apnea, hypothyroidism, who presents for follow up of dysphagia.  Patient reports that he has presented dysphagia for multiple months, but states that since Christmas it has been happening more frequently. He is currently having the symptoms on a daily basis. He has noticed it happens quite frequently with solids such as meat, pound cake or bread, but sometimes happens with vegetables. A couple of times he has had issues with liquids. No food impaction episodes. He moves his head in multiple positions to help the food go down so he has avoided having persistent food impaction or needing to vomit the food. States his throat has been very dry as he has not been using his mask for OSA. Has been scared as he has presented recurrent choking episodes. Notably, patient has dentures but does not use them all the time. He tries to eat very chopped food to avoid the episodes of dysphagia.  Occasionally he has presented some heartburn. He takes Gas relief pillsx3 when he has these episodes.Has heartburn once a month, which may deopend on the type of food he eats.  The patient denies having any nausea, vomiting, fever, chills, hematochezia, melena, hematemesis, abdominal distention, abdominal pain, diarrhea, jaundice, pruritus. Has  lost some weight but he thinks he is eating the same portions as before. No odynophagia.  Last EGD: Never had 1 Last Colonoscopy: 09/08/2020 - Melanosis in the colon. - One small polyp in the proximal ascending colon. Biopsied. - One 5 mm polyp in the sigmoid colon, removed with a cold snare. Resected and retrieved. - Diverticulosis in the sigmoid colon. - External hemorrhoids. - Biopsies were taken with a cold forceps from the ascending colon and sigmoid colon for evaluation of microscopic colitis.  Pathology: Only 1 polyp was reported, which was tubular adenoma.  Random colon biopsies were negative for microscopic colitis.  Recommended to repeat colonoscopy in 7 years  Past Medical History: Past Medical History:  Diagnosis Date   Adie's pupil    Anemia 07/14/2013   Borderline diabetes    CAD (coronary artery disease), native coronary artery 04/09/2013   Dist LM stenosis 70-80% (at trifurcation into LAD, CX, & 2 Ramus branches, 70% mid RCA);; b) Myoview 08/06/14: Low Risk, no Ischemia/infarction   Depression 1983   "after I was dx'd w/CA; now just comes in spells"  (12/22/2015)   Essential hypertension    Exertional shortness of breath    Hodgkin's lymphoma ([place]) 1983   Hypercholesterolemia    Hypothyroidism    Invasive ductal carcinoma of breast, stage 2 ([place]) 04/27/2011   chemo, mastectomy   Left carotid artery partial occlusion 04/27/2011   CAROTIC DOPPLER 11/2013: < 40% R ICA stenosis, distal waveforms damped - suggests probably intracranial occlusoin, < 39% LICA, normal vertebrals -- no change   Mild aortic stenosis by prior echocardiogram 02/2018   By echo June 2020: Mild-Mod AS (Mean Gradient 13.5 mmHg)   Mild mitral stenosis by prior echocardiogram  08/06/2014   Most recent echo June 2020: Estimated valve area 2.83 cm (in 2019 was roughly 2 cm).  Mean gradient 6-8 mmHg.  Appears to be stable.   Personal history of chemotherapy    Restless leg syndrome    Right carotid  artery occlusion 04/27/2011   S/P CABG x 3 04/09/2013   LIMA-LAD, fLRAD-RI, SVG-RCA; Echo 10/29/'15:  mild Conc LVH, no WMA, Gr 1 DD, Mod AoV Sclerosis w/ Mod AI, ~Mild-Mod MS    Sleep apnea    Stroke [facility])    2007   Thyroid disease     Past Surgical History: Past Surgical History:  Procedure Laterality Date   BIOPSY  09/08/2020   Procedure: BIOPSY;  Surgeon: Rogene Houston, MD;  [facility];  Service: Endoscopy;;  colon   BREAST BIOPSY Right    CARDIAC CATHETERIZATION  2014   CATARACT EXTRACTION W/PHACO Right 06/21/2015   Procedure: CATARACT EXTRACTION PHACO AND INTRAOCULAR LENS PLACEMENT ([place]);  Surgeon: Tonny Branch, MD;  Location: [facility];  Service: Ophthalmology;  Laterality: Right;  CDE: 7.05   CATARACT EXTRACTION W/PHACO Left 07/01/2015   Procedure: CATARACT EXTRACTION PHACO AND INTRAOCULAR LENS PLACEMENT ([facility]);  Surgeon: Tonny Branch, MD;  Location: [facility];  Service: Ophthalmology;  Laterality: Left;  CDE: 7.49   COLONOSCOPY [name] 05/06/2015   Procedure: COLONOSCOPY;  Surgeon: Rogene Houston, MD;  [facility];  Service: Endoscopy;  Laterality: [name];  930   COLONOSCOPY WITH PROPOFOL [name] 09/08/2020   Procedure: COLONOSCOPY WITH PROPOFOL;  Surgeon: Rogene Houston, MD;  [facility];  Service: Endoscopy;  Laterality: [name];  915   CORONARY ARTERY BYPASS GRAFT [name] 04/08/2013   Procedure: CORONARY ARTERY BYPASS GRAFTING (CABG);  Surgeon: Ivin Poot, MD;  Location: [place];  Service: Open Heart Surgery;  Laterality: [name];  Times 3 using left internal mammary artery; endoscopically harvested right saphenous vein and left radial artery   INTRAOPERATIVE TRANSESOPHAGEAL ECHOCARDIOGRAM [name] 04/08/2013   Procedure: INTRAOPERATIVE TRANSESOPHAGEAL ECHOCARDIOGRAM;  Surgeon: Ivin Poot, MD;  Location: [place];  Service: Open Heart Surgery;  Laterality: [name];   LAPAROSCOPIC APPENDECTOMY [name] 12/23/2015   Procedure: APPENDECTOMY LAPAROSCOPIC;  Surgeon: Donnie Mesa, MD;   Location: [place];  Service: General;  Laterality: [name];   LEFT HEART CATHETERIZATION WITH CORONARY ANGIOGRAM [name] 04/07/2013   Procedure: LEFT HEART CATHETERIZATION WITH CORONARY ANGIOGRAM;  Surgeon: Leonie Man, MD;  Location: [facility] C[facility] LAB;  Service: Cardiovascular;  Laterality: [name];   LYMPH NODE BIOPSY Left 1983   neck   MASTECTOMY Right    w/axillary lymph node dissection   NM MYOVIEW LTD  08/2019    EF 55 to 60%.  LOW RISK.  No ischemia or infarction noted.   NM TREADMILL MYOVIEW LTD  08/06/2014   Exercise 5:07 min, 6.3 METS; symptoms noted or extreme dyspnea and lightheadedness with some chest tightness. No EKG changes. No ischemia or infarction was normal EF.   POLYPECTOMY  09/08/2020   Procedure: POLYPECTOMY;  Surgeon: Rogene Houston, MD;  [facility];  Service: Endoscopy;;   RADIAL ARTERY HARVEST Left 04/08/2013   Procedure: RADIAL ARTERY HARVEST;  Surgeon: Ivin Poot, MD;  Location: [place];  Service: Open Heart Surgery;  Laterality: Left;   TONSILLECTOMY  1974   TRANSTHORACIC ECHOCARDIOGRAM  08/06/2014; 02/2018   a) Nl EF 55-60%. Gr 1 DD-high LVEDP. Mod AoV Sclerosis w/o AS. Mild AI; mild MS - mean grad 7 mmHg @ HR 122  bpm.;; b) Initial: EF 55-60%, GRII DD.  HighLAP/LVEDP.  ? Bicuspid AoV -severely Ca2+.  Mild Ao root dilation.  Severe MAC, mild MS (MVA by P1/2 T & continuity equation ~2 cm).  Mod RV dilation w/ mild- mod reduced EF.---> Limited w/Defiinity: No RWMA, Mild AS, Mod MS (~mean gradient 8 mmHg   TRANSTHORACIC ECHOCARDIOGRAM  02/2020   Normal LV size and function.  EF 65 to 70% no R WMA.  Mild LVH with basal septum.  GRII DD with elevated LVEDP.  Moderate reduced RV function with mild RV dilation, but normal PA pressures.  Mild LA dilation.  Moderate mitral stenosis, moderate AS & AI.  Mild MR, Mod MS (mean gradient 9 mmHg).  Moderate calcified AS w/ Mod AI -> Mean gradient 17.3 mmHg with peak 32 mm.   TRANSTHORACIC ECHOCARDIOGRAM  03/2019    EF 60-65%, mild  concentric LVH. Gr 2 DD. No RWMA. Mild-Mod RV dilation & enlargement. Mild LA dilation. Mod MV Calcification . Mod MS (previously read as mild, but mean gradient is lower than and estimated valve area is higher than last check).  Mild-Mod AS (Mean Gradient 13.5 mmHg)    Family History: Family History  Problem Relation Age of Onset   Alzheimer's disease Mother    Stroke Mother    Hypertension Mother    Hyperlipidemia Mother    Diabetes Mother    Hypertension Father    Diabetes Paternal Grandmother    Diabetes Paternal Grandfather    Breast cancer Paternal Aunt    Ovarian cancer Paternal Aunt     Social History: Social History   Tobacco Use  Smoking Status Former   Packs/day: 1.00   Years: 15.00   Pack years: 15.00   Types: Cigarettes   Quit date: 06/09/1982   Years since quitting: 39.4  Smokeless Tobacco Never   Social History   Substance and Sexual Activity  Alcohol Use No   Social History   Substance and Sexual Activity  Drug Use No    Allergies: No Known Allergies  Medications: Current Outpatient Medications  Medication Sig Dispense Refill   acetaminophen (TYLENOL) 500 MG tablet Take 500 mg by mouth every 6 (six) hours as needed for mild pain.      albuterol (PROVENTIL HFA;VENTOLIN HFA) 108 (90 Base) MCG/ACT inhaler Inhale 2 puffs into the lungs every 6 (six) hours as needed for wheezing or shortness of breath.     albuterol (PROVENTIL) 2 MG tablet Take 2 mg by mouth daily.     ALPRAZolam (XANAX) 1 MG tablet Take 0.5-1 mg by mouth at bedtime.     Artificial Tear Solution (SOOTHE XP OP) Place 1 drop into both eyes daily as needed (dry eyes).     aspirin EC 81 MG tablet Take 1 tablet (81 mg total) by mouth daily. 90 tablet 3   Black Pepper-Turmeric (TURMERIC CURCUMIN) 02-999 MG CAPS Take 1,000 mg by mouth 2 (two) times daily.     Cholecalciferol (VITAMIN D3) 250 MCG (10000 UT) capsule Take 10,000 Units by mouth daily.     citalopram (CELEXA) 20 MG tablet Take 40  mg by mouth every evening.      diphenhydrAMINE (BENADRYL) 25 MG tablet Take 25 mg by mouth daily as needed for allergies.     Ferrous Gluconate (IRON 27 PO) Take 27 mg by mouth daily.     Garlic 2956 MG CAPS Take 1,000 mg by mouth daily.     Ginkgo Biloba 120 MG CAPS Take 120 mg  by mouth daily.     levothyroxine (SYNTHROID, LEVOTHROID) 75 MCG tablet Take 75 mcg by mouth daily before breakfast.      nitroGLYCERIN (NITROSTAT) 0.4 MG SL tablet Place 1 tablet (0.4 mg total) under the tongue every 5 (five) minutes as needed for chest pain. 25 tablet 12   Omega-3 Fatty Acids (SALMON OIL-1000 PO) Take 1,000 mg by mouth daily.     OVER THE COUNTER MEDICATION Take 1 tablet by mouth daily. Ultra kidney complex     OVER THE COUNTER MEDICATION Take 1 tablet by mouth daily. Liver 10 complex otc supplement     OVER THE COUNTER MEDICATION Psyllium 500 mg daily.     OVER THE COUNTER MEDICATION Cinnamon 500 mg bid.     rOPINIRole (REQUIP) 4 MG tablet Take 4 mg by mouth in the morning and at bedtime. Two times per day.     simvastatin (ZOCOR) 80 MG tablet Take 80 mg by mouth every evening.      vitamin B-12 (CYANOCOBALAMIN) 500 MCG tablet Take 500 mcg by mouth daily.     No current facility-administered medications for this visit.    Review of Systems: GENERAL: negative for malaise, night sweats HEENT: No changes in hearing or vision, no nose bleeds or other nasal problems. NECK: Negative for lumps, goiter, pain and significant neck swelling RESPIRATORY: Negative for cough, wheezing CARDIOVASCULAR: Negative for chest pain, leg swelling, palpitations, orthopnea GI: SEE HPI MUSCULOSKELETAL: Negative for joint pain or swelling, back pain, and muscle pain. SKIN: Negative for lesions, rash PSYCH: Negative for sleep disturbance, mood disorder and recent psychosocial stressors. HEMATOLOGY Negative for prolonged bleeding, bruising easily, and swollen nodes. ENDOCRINE: Negative for cold or heat intolerance,  polyuria, polydipsia and goiter. NEURO: negative for tremor, gait imbalance, syncope and seizures. The remainder of the review of systems is noncontributory.   Physical Exam: BP 129/63 (BP Location: Left Arm, Patient Position: Sitting, Cuff Size: Small)    Pulse 87    Temp 97.9 F (36.6 C) (Oral)    Ht _0  (1.905 m)    Wt 232 lb 3.2 oz (105.3 kg)    BMI 29.02 kg/m  GENERAL: The patient is AO x3, in no acute distress. HEENT: Head is normocephalic and atraumatic. EOMI are intact. Mouth is well hydrated and without lesions, edentulous. NECK: Supple. No masses LUNGS: Clear to auscultation. No presence of rhonchi/wheezing/rales. Adequate chest expansion HEART: RRR, normal s1 and s2. ABDOMEN: Soft, nontender, no guarding, no peritoneal signs, and nondistended. BS +. Has mid abdominal hernia. EXTREMITIES: Without any cyanosis, clubbing, rash, lesions or edema. NEUROLOGIC: AOx3, no focal motor deficit. SKIN: no jaundice, no rashes  Imaging/Labs: as above  I personally reviewed and interpreted the available labs, imaging and endoscopic files.  Impression and Plan: Ryan [name] is a 71 y.o. male with past medical history of coronary artery disease status post CABG, depression, hypertension, Hodgkin's lymphoma diagnosed in 1983 s/ radiotherapy, hyperlipidemia, hypothyroidism, invasive ductal carcinoma of the breast status postmastectomy and chemotherapy, stroke, sleep apnea, hypothyroidism, who presents for follow up of dysphagia.  The patient has presented recurrent episode of dysphagia which have been getting worse recently.  He has not presented any red flag signs but he has been concerned as he has been choking frequently.  It is possible that he has presented stricturing disease given his history of radiation in the past.  We will explore his symptoms further with an EGD with possible esophageal dilation.  The patient understood and agreed.  Dietary recommendations were given to the patient  for now to avoid food impaction episodes.  - Schedule EGD with ED  All questions were answered.      Harvel Quale, MD Gastroenterology and Hepatology [facility]

## 2021-11-28 NOTE — Progress Notes (Signed)
Maylon Peppers, M.D. Gastroenterology & Hepatology [facility] [address] [place], [place] [address]  Primary Care Physician: Redmond School, MD [address] [place] [address]  I will communicate my assessment and recommendations to the referring MD via EMR.  Problems: Dysphagia  History of Present Illness: Ryan [name] is a 71 y.o. male with past medical history of coronary artery disease status post CABG, depression, hypertension, Hodgkin's lymphoma diagnosed in 1983 s/ radiotherapy, hyperlipidemia, hypothyroidism, invasive ductal carcinoma of the breast status postmastectomy and chemotherapy, stroke, sleep apnea, hypothyroidism, who presents for follow up of dysphagia.  Patient reports that he has presented dysphagia for multiple months, but states that since Christmas it has been happening more frequently. He is currently having the symptoms on a daily basis. He has noticed it happens quite frequently with solids such as meat, pound cake or bread, but sometimes happens with vegetables. A couple of times he has had issues with liquids. No food impaction episodes. He moves his head in multiple positions to help the food go down so he has avoided having persistent food impaction or needing to vomit the food. States his throat has been very dry as he has not been using his mask for OSA. Has been scared as he has presented recurrent choking episodes. Notably, patient has dentures but does not use them all the time. He tries to eat very chopped food to avoid the episodes of dysphagia.  Occasionally he has presented some heartburn. He takes Gas relief pillsx3 when he has these episodes.Has heartburn once a month, which may deopend on the type of food he eats.  The patient denies having any nausea, vomiting, fever, chills, hematochezia, melena, hematemesis, abdominal distention, abdominal pain, diarrhea, jaundice, pruritus. Has  lost some weight but he thinks he is eating the same portions as before. No odynophagia.  Last EGD: Never had 1 Last Colonoscopy: 09/08/2020 - Melanosis in the colon. - One small polyp in the proximal ascending colon. Biopsied. - One 5 mm polyp in the sigmoid colon, removed with a cold snare. Resected and retrieved. - Diverticulosis in the sigmoid colon. - External hemorrhoids. - Biopsies were taken with a cold forceps from the ascending colon and sigmoid colon for evaluation of microscopic colitis.  Pathology: Only 1 polyp was reported, which was tubular adenoma.  Random colon biopsies were negative for microscopic colitis.  Recommended to repeat colonoscopy in 7 years  Past Medical History: Past Medical History:  Diagnosis Date   Adie's pupil    Anemia 07/14/2013   Borderline diabetes    CAD (coronary artery disease), native coronary artery 04/09/2013   Dist LM stenosis 70-80% (at trifurcation into LAD, CX, & 2 Ramus branches, 70% mid RCA);; b) Myoview 08/06/14: Low Risk, no Ischemia/infarction   Depression 1983   "after I was dx'd w/CA; now just comes in spells"  (12/22/2015)   Essential hypertension    Exertional shortness of breath    Hodgkin's lymphoma ([place]) 1983   Hypercholesterolemia    Hypothyroidism    Invasive ductal carcinoma of breast, stage 2 ([place]) 04/27/2011   chemo, mastectomy   Left carotid artery partial occlusion 04/27/2011   CAROTIC DOPPLER 11/2013: < 40% R ICA stenosis, distal waveforms damped - suggests probably intracranial occlusoin, < 34% LICA, normal vertebrals -- no change   Mild aortic stenosis by prior echocardiogram 02/2018   By echo June 2020: Mild-Mod AS (Mean Gradient 13.5 mmHg)   Mild mitral stenosis by prior echocardiogram  08/06/2014   Most recent echo June 2020: Estimated valve area 2.83 cm (in 2019 was roughly 2 cm).  Mean gradient 6-8 mmHg.  Appears to be stable.   Personal history of chemotherapy    Restless leg syndrome    Right carotid  artery occlusion 04/27/2011   S/P CABG x 3 04/09/2013   LIMA-LAD, fLRAD-RI, SVG-RCA; Echo 10/29/'15:  mild Conc LVH, no WMA, Gr 1 DD, Mod AoV Sclerosis w/ Mod AI, ~Mild-Mod MS    Sleep apnea    Stroke [facility])    2007   Thyroid disease     Past Surgical History: Past Surgical History:  Procedure Laterality Date   BIOPSY  09/08/2020   Procedure: BIOPSY;  Surgeon: Rogene Houston, MD;  [facility];  Service: Endoscopy;;  colon   BREAST BIOPSY Right    CARDIAC CATHETERIZATION  2014   CATARACT EXTRACTION W/PHACO Right 06/21/2015   Procedure: CATARACT EXTRACTION PHACO AND INTRAOCULAR LENS PLACEMENT ([place]);  Surgeon: Tonny [place], MD;  Location: [facility] OR[facility];  Service: Ophthalmology;  Laterality: Right;  CDE: 7.05   CATARACT EXTRACTION W/PHACO Left 07/01/2015   Procedure: CATARACT EXTRACTION PHACO AND INTRAOCULAR LENS PLACEMENT ([facility]);  Surgeon: Tonny [place], MD;  Location: [facility];  Service: Ophthalmology;  Laterality: Left;  CDE: 7.49   COLONOSCOPY [name] 05/06/2015   Procedure: COLONOSCOPY;  Surgeon: Rogene Houston, MD;  [facility];  Service: Endoscopy;  Laterality: [name];  930   COLONOSCOPY WITH PROPOFOL [name] 09/08/2020   Procedure: COLONOSCOPY WITH PROPOFOL;  Surgeon: Rogene Houston, MD;  [facility];  Service: Endoscopy;  Laterality: [name];  915   CORONARY ARTERY BYPASS GRAFT [name] 04/08/2013   Procedure: CORONARY ARTERY BYPASS GRAFTING (CABG);  Surgeon: Ivin Poot, MD;  Location: [place];  Service: Open Heart Surgery;  Laterality: [name];  Times 3 using left internal mammary artery; endoscopically harvested right saphenous vein and left radial artery   INTRAOPERATIVE TRANSESOPHAGEAL ECHOCARDIOGRAM [name] 04/08/2013   Procedure: INTRAOPERATIVE TRANSESOPHAGEAL ECHOCARDIOGRAM;  Surgeon: Ivin Poot, MD;  Location: [place];  Service: Open Heart Surgery;  Laterality: [name];   LAPAROSCOPIC APPENDECTOMY [name] 12/23/2015   Procedure: APPENDECTOMY LAPAROSCOPIC;  Surgeon: Donnie Mesa, MD;   Location: [place];  Service: General;  Laterality: [name];   LEFT HEART CATHETERIZATION WITH CORONARY ANGIOGRAM [name] 04/07/2013   Procedure: LEFT HEART CATHETERIZATION WITH CORONARY ANGIOGRAM;  Surgeon: Leonie Man, MD;  Location: [facility] CATH LAB;  Service: Cardiovascular;  Laterality: [name];   LYMPH NODE BIOPSY Left 1983   neck   MASTECTOMY Right    w/axillary lymph node dissection   NM MYOVIEW LTD  08/2019    EF 55 to 60%.  LOW RISK.  No ischemia or infarction noted.   NM TREADMILL MYOVIEW LTD  08/06/2014   Exercise 5:07 min, 6.3 METS; symptoms noted or extreme dyspnea and lightheadedness with some chest tightness. No EKG changes. No ischemia or infarction was normal EF.   POLYPECTOMY  09/08/2020   Procedure: POLYPECTOMY;  Surgeon: Rogene Houston, MD;  [facility];  Service: Endoscopy;;   RADIAL ARTERY HARVEST Left 04/08/2013   Procedure: RADIAL ARTERY HARVEST;  Surgeon: Ivin Poot, MD;  Location: [place];  Service: Open Heart Surgery;  Laterality: Left;   TONSILLECTOMY  1974   TRANSTHORACIC ECHOCARDIOGRAM  08/06/2014; 02/2018   a) Nl EF 55-60%. Gr 1 DD-high LVEDP. Mod AoV Sclerosis w/o AS. Mild AI; mild MS - mean grad 7 mmHg @ HR 122  bpm.;; b) Initial: EF 55-60%, GRII DD.  HighLAP/LVEDP.  ? Bicuspid AoV -severely Ca2+.  Mild Ao root dilation.  Severe MAC, mild MS (MVA by P1/2 T & continuity equation ~2 cm).  Mod RV dilation w/ mild- mod reduced EF.---> Limited w/Defiinity: No RWMA, Mild AS, Mod MS (~mean gradient 8 mmHg   TRANSTHORACIC ECHOCARDIOGRAM  02/2020   Normal LV size and function.  EF 65 to 70% no R WMA.  Mild LVH with basal septum.  GRII DD with elevated LVEDP.  Moderate reduced RV function with mild RV dilation, but normal PA pressures.  Mild LA dilation.  Moderate mitral stenosis, moderate AS & AI.  Mild MR, Mod MS (mean gradient 9 mmHg).  Moderate calcified AS w/ Mod AI -> Mean gradient 17.3 mmHg with peak 32 mm.   TRANSTHORACIC ECHOCARDIOGRAM  03/2019    EF 60-65%, mild  concentric LVH. Gr 2 DD. No RWMA. Mild-Mod RV dilation & enlargement. Mild LA dilation. Mod MV Calcification . Mod MS (previously read as mild, but mean gradient is lower than and estimated valve area is higher than last check).  Mild-Mod AS (Mean Gradient 13.5 mmHg)    Family History: Family History  Problem Relation Age of Onset   Alzheimer's disease Mother    Stroke Mother    Hypertension Mother    Hyperlipidemia Mother    Diabetes Mother    Hypertension Father    Diabetes Paternal Grandmother    Diabetes Paternal Grandfather    Breast cancer Paternal Aunt    Ovarian cancer Paternal Aunt     Social History: Social History   Tobacco Use  Smoking Status Former   Packs/day: 1.00   Years: 15.00   Pack years: 15.00   Types: Cigarettes   Quit date: 06/09/1982   Years since quitting: 39.4  Smokeless Tobacco Never   Social History   Substance and Sexual Activity  Alcohol Use No   Social History   Substance and Sexual Activity  Drug Use No    Allergies: No Known Allergies  Medications: Current Outpatient Medications  Medication Sig Dispense Refill   acetaminophen (TYLENOL) 500 MG tablet Take 500 mg by mouth every 6 (six) hours as needed for mild pain.      albuterol (PROVENTIL HFA;VENTOLIN HFA) 108 (90 Base) MCG/ACT inhaler Inhale 2 puffs into the lungs every 6 (six) hours as needed for wheezing or shortness of breath.     albuterol (PROVENTIL) 2 MG tablet Take 2 mg by mouth daily.     ALPRAZolam (XANAX) 1 MG tablet Take 0.5-1 mg by mouth at bedtime.     Artificial Tear Solution (SOOTHE XP OP) Place 1 drop into both eyes daily as needed (dry eyes).     aspirin EC 81 MG tablet Take 1 tablet (81 mg total) by mouth daily. 90 tablet 3   Black Pepper-Turmeric (TURMERIC CURCUMIN) 02-999 MG CAPS Take 1,000 mg by mouth 2 (two) times daily.     Cholecalciferol (VITAMIN D3) 250 MCG (10000 UT) capsule Take 10,000 Units by mouth daily.     citalopram (CELEXA) 20 MG tablet Take 40  mg by mouth every evening.      diphenhydrAMINE (BENADRYL) 25 MG tablet Take 25 mg by mouth daily as needed for allergies.     Ferrous Gluconate (IRON 27 PO) Take 27 mg by mouth daily.     Garlic 2956 MG CAPS Take 1,000 mg by mouth daily.     Ginkgo Biloba 120 MG CAPS Take 120 mg  by mouth daily.     levothyroxine (SYNTHROID, LEVOTHROID) 75 MCG tablet Take 75 mcg by mouth daily before breakfast.      nitroGLYCERIN (NITROSTAT) 0.4 MG SL tablet Place 1 tablet (0.4 mg total) under the tongue every 5 (five) minutes as needed for chest pain. 25 tablet 12   Omega-3 Fatty Acids (SALMON OIL-1000 PO) Take 1,000 mg by mouth daily.     OVER THE COUNTER MEDICATION Take 1 tablet by mouth daily. Ultra kidney complex     OVER THE COUNTER MEDICATION Take 1 tablet by mouth daily. Liver 10 complex otc supplement     OVER THE COUNTER MEDICATION Psyllium 500 mg daily.     OVER THE COUNTER MEDICATION Cinnamon 500 mg bid.     rOPINIRole (REQUIP) 4 MG tablet Take 4 mg by mouth in the morning and at bedtime. Two times per day.     simvastatin (ZOCOR) 80 MG tablet Take 80 mg by mouth every evening.      vitamin B-12 (CYANOCOBALAMIN) 500 MCG tablet Take 500 mcg by mouth daily.     No current facility-administered medications for this visit.    Review of Systems: GENERAL: negative for malaise, night sweats HEENT: No changes in hearing or vision, no nose bleeds or other nasal problems. NECK: Negative for lumps, goiter, pain and significant neck swelling RESPIRATORY: Negative for cough, wheezing CARDIOVASCULAR: Negative for chest pain, leg swelling, palpitations, orthopnea GI: SEE HPI MUSCULOSKELETAL: Negative for joint pain or swelling, back pain, and muscle pain. SKIN: Negative for lesions, rash PSYCH: Negative for sleep disturbance, mood disorder and recent psychosocial stressors. HEMATOLOGY Negative for prolonged bleeding, bruising easily, and swollen nodes. ENDOCRINE: Negative for cold or heat intolerance,  polyuria, polydipsia and goiter. NEURO: negative for tremor, gait imbalance, syncope and seizures. The remainder of the review of systems is noncontributory.   Physical Exam: BP 129/63 (BP Location: Left Arm, Patient Position: Sitting, Cuff Size: Small)    Pulse 87    Temp 97.9 F (36.6 C) (Oral)    Ht _0  (1.905 m)    Wt 232 lb 3.2 oz (105.3 kg)    BMI 29.02 kg/m  GENERAL: The patient is AO x3, in no acute distress. HEENT: Head is normocephalic and atraumatic. EOMI are intact. Mouth is well hydrated and without lesions, edentulous. NECK: Supple. No masses LUNGS: Clear to auscultation. No presence of rhonchi/wheezing/rales. Adequate chest expansion HEART: RRR, normal s1 and s2. ABDOMEN: Soft, nontender, no guarding, no peritoneal signs, and nondistended. BS +. Has mid abdominal hernia. EXTREMITIES: Without any cyanosis, clubbing, rash, lesions or edema. NEUROLOGIC: AOx3, no focal motor deficit. SKIN: no jaundice, no rashes  Imaging/Labs: as above  I personally reviewed and interpreted the available labs, imaging and endoscopic files.  Impression and Plan: Ryan [name] is a 71 y.o. male with past medical history of coronary artery disease status post CABG, depression, hypertension, Hodgkin's lymphoma diagnosed in 1983 s/ radiotherapy, hyperlipidemia, hypothyroidism, invasive ductal carcinoma of the breast status postmastectomy and chemotherapy, stroke, sleep apnea, hypothyroidism, who presents for follow up of dysphagia.  The patient has presented recurrent episode of dysphagia which have been getting worse recently.  He has not presented any red flag signs but he has been concerned as he has been choking frequently.  It is possible that he has presented stricturing disease given his history of radiation in the past.  We will explore his symptoms further with an EGD with possible esophageal dilation.  The patient understood and agreed.  Dietary recommendations were given to the patient  for now to avoid food impaction episodes.  - Schedule EGD with ED  All questions were answered.      Harvel Quale, MD Gastroenterology and Hepatology [facility]

## 2021-11-29 ENCOUNTER — Encounter (INDEPENDENT_AMBULATORY_CARE_PROVIDER_SITE_OTHER): Payer: Self-pay

## 2021-12-05 NOTE — Patient Instructions (Signed)
? ? ? ? ? ? Ryan [name] ? 12/05/2021  ?  ? @PREFPERIOPPHARMACY @ ? ? Your procedure is scheduled on  12/09/2021.  ? ? Report to [name] at  1100  A.M. ? ? Call this number if you have problems the morning of surgery: ? [phone] ? ? Remember: ? Follow the diet instructions given to you by the office. ? ?  Use your inhaler before you come and bring your rescue inhaler with you. ?  ? Take these medicines the morning of surgery with A SIP OF WATER  ? ?                            albuterol, levothyroxine, requip. ?  ? ? Do not wear jewelry, make-up or nail polish. ? Do not wear lotions, powders, or perfumes, or deodorant. ? Do not shave 48 hours prior to surgery.  Men may shave face and neck. ? Do not bring valuables to the hospital. ? [facility] is not responsible for any belongings or valuables. ? ?Contacts, dentures or bridgework may not be worn into surgery.  Leave your suitcase in the car.  After surgery it may be brought to your room. ? ?For patients admitted to the hospital, discharge time will be determined by your treatment team. ? ?Patients discharged the day of surgery will not be allowed to drive home and must have someone with them for 24 hours.  ? ? ?Special instructions:   DO NOT smoke tobacco or vape for 24 hours before your procedure. ? ?Please read over the following fact sheets that you were given. ?Anesthesia Post-op Instructions and Care and Recovery After Surgery ?  ? ? ? Upper Endoscopy, Adult, Care After ?This sheet gives you information about how to care for yourself after your procedure. Your health care provider may also give you more specific instructions. If you have problems or questions, contact your health care provider. ?What can I expect after the procedure? ?After the procedure, it is common to have: ?A sore throat. ?Mild stomach pain or discomfort. ?Bloating. ?Nausea. ?Follow these instructions at home: ? ?Follow instructions from your health care provider about what to eat  or drink after your procedure. ?Return to your normal activities as told by your health care provider. Ask your health care provider what activities are safe for you. ?Take over-the-counter and prescription medicines only as told by your health care provider. ?If you were given a sedative during the procedure, it can affect you for several hours. Do not drive or operate machinery until your health care provider says that it is safe. ?Keep all follow-up visits as told by your health care provider. This is important. ?Contact a health care provider if you have: ?A sore throat that lasts longer than one day. ?Trouble swallowing. ?Get help right away if: ?You vomit blood or your vomit looks like coffee grounds. ?You have: ?A fever. ?Bloody, black, or tarry stools. ?A severe sore throat or you cannot swallow. ?Difficulty breathing. ?Severe pain in your chest or abdomen. ?Summary ?After the procedure, it is common to have a sore throat, mild stomach discomfort, bloating, and nausea. ?If you were given a sedative during the procedure, it can affect you for several hours. Do not drive or operate machinery until your health care provider says that it is safe. ?Follow instructions from your health care provider about what to eat or drink after your procedure. ?Return to your normal  activities as told by your health care provider. ?This information is not intended to replace advice given to you by your health care provider. Make sure you discuss any questions you have with your health care provider. ?Document Revised: 08/01/2019 Document Reviewed: 02/25/2018 ?Elsevier Patient Education ? [place]. ?Monitored Anesthesia Care, Care After ?This sheet gives you information about how to care for yourself after your procedure. Your health care provider may also give you more specific instructions. If you have problems or questions, contact your health care provider. ?What can I expect after the procedure? ?After the  procedure, it is common to have: ?Tiredness. ?Forgetfulness about what happened after the procedure. ?Impaired judgment for important decisions. ?Nausea or vomiting. ?Some difficulty with balance. ?Follow these instructions at home: ?For the time period you were told by your health care provider: ?  ?Rest as needed. ?Do not participate in activities where you could fall or become injured. ?Do not drive or use machinery. ?Do not drink alcohol. ?Do not take sleeping pills or medicines that cause drowsiness. ?Do not make important decisions or sign legal documents. ?Do not take care of children on your own. ?Eating and drinking ?Follow the diet that is recommended by your health care provider. ?Drink enough fluid to keep your urine pale yellow. ?If you vomit: ?Drink water, juice, or soup when you can drink without vomiting. ?Make sure you have little or no nausea before eating solid foods. ?General instructions ?Have a responsible adult stay with you for the time you are told. It is important to have someone help care for you until you are awake and alert. ?Take over-the-counter and prescription medicines only as told by your health care provider. ?If you have sleep apnea, surgery and certain medicines can increase your risk for breathing problems. Follow instructions from your health care provider about wearing your sleep device: ?Anytime you are sleeping, including during daytime naps. ?While taking prescription pain medicines, sleeping medicines, or medicines that make you drowsy. ?Avoid smoking. ?Keep all follow-up visits as told by your health care provider. This is important. ?Contact a health care provider if: ?You keep feeling nauseous or you keep vomiting. ?You feel light-headed. ?You are still sleepy or having trouble with balance after 24 hours. ?You develop a rash. ?You have a fever. ?You have redness or swelling around the IV site. ?Get help right away if: ?You have trouble breathing. ?You have new-onset  confusion at home. ?Summary ?For several hours after your procedure, you may feel tired. You may also be forgetful and have poor judgment. ?Have a responsible adult stay with you for the time you are told. It is important to have someone help care for you until you are awake and alert. ?Rest as told. Do not drive or operate machinery. Do not drink alcohol or take sleeping pills. ?Get help right away if you have trouble breathing, or if you suddenly become confused. ?This information is not intended to replace advice given to you by your health care provider. Make sure you discuss any questions you have with your health care provider. ?Document Revised: 06/10/2020 Document Reviewed: 08/28/2019 ?Elsevier Patient Education ? [place]. ? ?

## 2021-12-07 ENCOUNTER — Other Ambulatory Visit: Payer: Self-pay

## 2021-12-07 ENCOUNTER — Encounter (HOSPITAL_COMMUNITY)
Admission: RE | Admit: 2021-12-07 | Discharge: 2021-12-07 | Disposition: A | Discharge status: Home / Self Care | Payer: Medicare Other | Source: Ambulatory Visit | Attending: Gastroenterology | Admitting: Gastroenterology

## 2021-12-07 ENCOUNTER — Encounter (HOSPITAL_COMMUNITY): Payer: Self-pay

## 2021-12-07 HISTORY — DX: Prediabetes: R73.03

## 2021-12-09 ENCOUNTER — Ambulatory Visit (HOSPITAL_COMMUNITY): Payer: Medicare Other | Admitting: Anesthesiology

## 2021-12-09 ENCOUNTER — Ambulatory Visit (HOSPITAL_COMMUNITY)
Admission: RE | Admit: 2021-12-09 | Discharge: 2021-12-09 | Disposition: A | Discharge status: Home / Self Care | Payer: Medicare Other | Attending: Gastroenterology | Admitting: Gastroenterology

## 2021-12-09 ENCOUNTER — Other Ambulatory Visit: Payer: Self-pay

## 2021-12-09 ENCOUNTER — Ambulatory Visit (HOSPITAL_BASED_OUTPATIENT_CLINIC_OR_DEPARTMENT_OTHER): Payer: Medicare Other | Admitting: Anesthesiology

## 2021-12-09 ENCOUNTER — Encounter (HOSPITAL_COMMUNITY): Payer: Self-pay | Admitting: Gastroenterology

## 2021-12-09 ENCOUNTER — Encounter (HOSPITAL_COMMUNITY)
Admission: RE | Disposition: A | Discharge status: Home / Self Care | Payer: Self-pay | Source: Home / Self Care | Attending: Gastroenterology

## 2021-12-09 DIAGNOSIS — Z86010 Personal history of colonic polyps: Secondary | ICD-10-CM | POA: Diagnosis not present

## 2021-12-09 DIAGNOSIS — G4733 Obstructive sleep apnea (adult) (pediatric): Secondary | ICD-10-CM | POA: Insufficient documentation

## 2021-12-09 DIAGNOSIS — Z853 Personal history of malignant neoplasm of breast: Secondary | ICD-10-CM | POA: Insufficient documentation

## 2021-12-09 DIAGNOSIS — F32A Depression, unspecified: Secondary | ICD-10-CM | POA: Insufficient documentation

## 2021-12-09 DIAGNOSIS — I2510 Atherosclerotic heart disease of native coronary artery without angina pectoris: Secondary | ICD-10-CM | POA: Diagnosis not present

## 2021-12-09 DIAGNOSIS — K2990 Gastroduodenitis, unspecified, without bleeding: Secondary | ICD-10-CM | POA: Diagnosis not present

## 2021-12-09 DIAGNOSIS — R1310 Dysphagia, unspecified: Secondary | ICD-10-CM | POA: Diagnosis not present

## 2021-12-09 DIAGNOSIS — Z8673 Personal history of transient ischemic attack (TIA), and cerebral infarction without residual deficits: Secondary | ICD-10-CM | POA: Insufficient documentation

## 2021-12-09 DIAGNOSIS — E785 Hyperlipidemia, unspecified: Secondary | ICD-10-CM | POA: Diagnosis not present

## 2021-12-09 DIAGNOSIS — Z9221 Personal history of antineoplastic chemotherapy: Secondary | ICD-10-CM | POA: Diagnosis not present

## 2021-12-09 DIAGNOSIS — Z8571 Personal history of Hodgkin lymphoma: Secondary | ICD-10-CM | POA: Diagnosis not present

## 2021-12-09 DIAGNOSIS — E039 Hypothyroidism, unspecified: Secondary | ICD-10-CM | POA: Diagnosis not present

## 2021-12-09 DIAGNOSIS — I10 Essential (primary) hypertension: Secondary | ICD-10-CM | POA: Diagnosis not present

## 2021-12-09 DIAGNOSIS — Z951 Presence of aortocoronary bypass graft: Secondary | ICD-10-CM | POA: Diagnosis not present

## 2021-12-09 DIAGNOSIS — K2970 Gastritis, unspecified, without bleeding: Secondary | ICD-10-CM | POA: Insufficient documentation

## 2021-12-09 DIAGNOSIS — K2980 Duodenitis without bleeding: Secondary | ICD-10-CM | POA: Insufficient documentation

## 2021-12-09 DIAGNOSIS — I080 Rheumatic disorders of both mitral and aortic valves: Secondary | ICD-10-CM | POA: Diagnosis not present

## 2021-12-09 DIAGNOSIS — Z87891 Personal history of nicotine dependence: Secondary | ICD-10-CM | POA: Insufficient documentation

## 2021-12-09 HISTORY — PX: BIOPSY: SHX5522

## 2021-12-09 HISTORY — PX: ESOPHAGOGASTRODUODENOSCOPY (EGD) WITH PROPOFOL: SHX5813

## 2021-12-09 HISTORY — PX: SAVORY DILATION: SHX5439

## 2021-12-09 SURGERY — ESOPHAGOGASTRODUODENOSCOPY (EGD) WITH PROPOFOL
Anesthesia: General

## 2021-12-09 MED ORDER — LIDOCAINE HCL (CARDIAC) PF 100 MG/5ML IV SOSY
PREFILLED_SYRINGE | INTRAVENOUS | Status: DC | PRN
  Administered 2021-12-09: 50 mg via INTRAVENOUS

## 2021-12-09 MED ORDER — PROPOFOL 500 MG/50ML IV EMUL
INTRAVENOUS | Status: DC | PRN
  Administered 2021-12-09 (×3): 50 mg via INTRAVENOUS
  Administered 2021-12-09: 30 mg via INTRAVENOUS

## 2021-12-09 MED ORDER — STERILE WATER FOR IRRIGATION IR SOLN
Status: DC | PRN
  Administered 2021-12-09: 120 mL

## 2021-12-09 MED ORDER — PROPOFOL 1000 MG/100ML IV EMUL
INTRAVENOUS | Status: AC
  Filled 2021-12-09: qty 100

## 2021-12-09 MED ORDER — LACTATED RINGERS IV SOLN: INTRAVENOUS | Status: DC

## 2021-12-09 NOTE — Op Note (Signed)
[facility] ?Patient Name: Ryan [name] ?Procedure Date: 12/09/2021 11:10 AM ?MRN: [id number] ?Date of Birth: Aug 09, 1951 ?Attending MD: Maylon Peppers ,  ?CSN: [id number] ?Age: 71 ?Admit Type: Outpatient ?Procedure:                Upper GI endoscopy ?Indications:              Dysphagia ?Providers:                Maylon Peppers, Hughie Closs RN, RN, Tammy  ?                          Vaught, RN, Aram Candela ?Referring MD:              ?Medicines:                Monitored Anesthesia Care ?Complications:            No immediate complications. ?Estimated Blood Loss:     Estimated blood loss: none. ?Procedure:                Pre-Anesthesia Assessment: ?                          - Prior to the procedure, a History and Physical  ?                          was performed, and patient medications, allergies  ?                          and sensitivities were reviewed. The patient's  ?                          tolerance of previous anesthesia was reviewed. ?                          - The risks and benefits of the procedure and the  ?                          sedation options and risks were discussed with the  ?                          patient. All questions were answered and informed  ?                          consent was obtained. ?                          - ASA Grade Assessment: II - A patient with mild  ?                          systemic disease. ?                          After obtaining informed consent, the endoscope was  ?                          passed under direct vision. Throughout the  ?  procedure, the patient's blood pressure, pulse, and  ?                          oxygen saturations were monitored continuously. The  ?                          GIF-H190 ([id number]) scope was introduced through the  ?                          mouth, and advanced to the second part of duodenum.  ?                          The upper GI endoscopy was accomplished without  ?                           difficulty. The patient tolerated the procedure  ?                          well. ?Scope In: 11:26:24 AM ?Scope Out: 11:41:38 AM ?Total Procedure Duration: 0 hours 15 minutes 14 seconds  ?Findings: ?     No endoscopic abnormality was evident in the esophagus to explain the  ?     patient's complaint of dysphagia. It was decided, however, to proceed  ?     with dilation of the entire esophagus. A guidewire was placed and the  ?     scope was withdrawn. Dilation was performed with a Savary dilator with  ?     no resistance at 18 mm. No mucosal disruption was seen upon  ?     reinspection. Biopsies were obtained from the proximal and distal  ?     esophagus with cold forceps for histology of suspected eosinophilic  ?     esophagitis. ?     The Z-line was regular and was found 40 cm from the incisors. ?     Scattered mild inflammation characterized by erosions and erythema was  ?     found in the gastric fundus and in the gastric antrum. Biopsies were  ?     taken with a cold forceps for Helicobacter pylori testing. ?     Patchy mild inflammation characterized by congestion (edema) and  ?     erythema was found in the duodenal bulb. ?Impression:               - No endoscopic esophageal abnormality to explain  ?                          patient's dysphagia. Esophagus dilated. Dilated.  ?                          Biopsied. ?                          - Z-line regular, 40 cm from the incisors. ?                          - Gastritis. Biopsied. ?                          -  Duodenitis. ?Moderate Sedation: ?     Per Anesthesia Care ?Recommendation:           - Discharge patient to home (ambulatory). ?                          - Resume previous diet. ?                          - Await pathology results. ?                          - Schedule barium esophagram. ?Procedure Code(s):        --- Professional --- ?                          [phone], Esophagogastroduodenoscopy, flexible,  ?                          transoral; with  insertion of guide wire followed by  ?                          passage of dilator(s) through esophagus over guide  ?                          wire ?                          43239, 59, Esophagogastroduodenoscopy, flexible,  ?                          transoral; with biopsy, single or multiple ?Diagnosis Code(s):        --- Professional --- ?                          R13.10, Dysphagia, unspecified ?                          K29.70, Gastritis, unspecified, without bleeding ?                          K29.80, Duodenitis without bleeding ?CPT copyright 2019 American [employer] Association. All rights reserved. ?The codes documented in this report are preliminary and upon coder review may  ?be revised to meet current compliance requirements. ?Maylon Peppers, MD ?Maylon Peppers,  ?12/09/2021 11:48:29 AM ?This report has been signed electronically. ?Number of Addenda: 0 ?

## 2021-12-09 NOTE — Discharge Instructions (Signed)
You are being discharged to home.  ?Resume your previous diet.  ?We are waiting for your pathology results.  ?Schedule barium esophagram. ?

## 2021-12-09 NOTE — Anesthesia Postprocedure Evaluation (Signed)
Anesthesia Post Note ? ?Patient: Ryan [name] ? ?Procedure(s) Performed: ESOPHAGOGASTRODUODENOSCOPY (EGD) WITH PROPOFOL ?BIOPSY ?SAVORY DILATION ? ?Patient location during evaluation: Endoscopy ?Anesthesia Type: General ?Level of consciousness: awake and alert and oriented ?Pain management: pain level controlled ?Vital Signs Assessment: post-procedure vital signs reviewed and stable ?Respiratory status: spontaneous breathing, nonlabored ventilation and respiratory function stable ?Cardiovascular status: blood pressure returned to baseline and stable ?Postop Assessment: no apparent nausea or vomiting ?Anesthetic complications: no ? ? ?No notable events documented. ? ? ?Last Vitals:  ?Vitals:  ? 12/09/21 1055 12/09/21 1147  ?BP: (!) 145/64 (!) 98/46  ?Pulse:  70  ?Resp:  (!) 21  ?Temp: 36.8 ?C   ?SpO2:  94%  ?  ?Last Pain:  ?Vitals:  ? 12/09/21 1147  ?TempSrc: Oral  ?PainSc: 0-No pain  ? ? ?  ?  ?  ?  ?  ?  ? ?Simi Briel C Ori Kreiter ? ? ? ? ?

## 2021-12-09 NOTE — Anesthesia Preprocedure Evaluation (Signed)
Anesthesia Evaluation  ?Patient identified by MRN, date of birth, ID band ?Patient awake ? ? ? ?Reviewed: ?Allergy & Precautions, NPO status , Patient's Chart, lab work & pertinent test results ? ?Airway ?Mallampati: II ? ?TM Distance: >3 FB ?Neck ROM: Full ? ? ? Dental ? ?(+) Edentulous Upper, Edentulous Lower ?  ?Pulmonary ?shortness of breath and with exertion, sleep apnea and Continuous Positive Airway Pressure Ventilation , former smoker,  ?  ? ? ? ? ? ? ? Cardiovascular ?hypertension, Pt. on medications ?+ CAD, + CABG, + Peripheral Vascular Disease (Right carotid occlusion) and + DOE  ?+ Valvular Problems/Murmurs (AS, MS) AS  ?Rhythm:Regular Rate:Normal ?+ Systolic murmurs ?Leonie Man, MD  ?02/18/2020 11:39 PM EDT  ? ?Pump function is actually hyperdynamic with the upper limit of normal 65 to 70%. ?This goes along with abnormal relaxation-grade 2 diastolic dysfunction that is a little bit more than would be expected for age. ?Probably not that significant however because the left atrium is only mildly dilated. ?Interestingly, the right ventricle does seem to be a little bit dilated with mildly reduced function but pressures are okay. ?? ?The mitral valve has moderate narrowing, and the aortic valve has moderate narrowing and moderate regurgitation. ?? ?Overall pretty stable findings from last time. ?The aortic valve appeared to be just a little bit more narrowed (increased from mild to moderate up to straight moderate) -> still not enough to do anything besides routine follow-up. ?? ?Glenetta Hew, MD ? ? ?  ?Neuro/Psych ?PSYCHIATRIC DISORDERS Depression  Neuromuscular disease CVA   ? GI/Hepatic ?negative GI ROS, Neg liver ROS,   ?Endo/Other  ?Hypothyroidism  ? Renal/GU ?negative Renal ROS  ?negative genitourinary ?  ?Musculoskeletal ?negative musculoskeletal ROS ?(+)  ? Abdominal ?  ?Peds ?negative pediatric ROS ?(+)  Hematology ? ?(+) Blood dyscrasia (hodgkins lymphoma),  anemia ,   ?Anesthesia Other Findings ? ? Reproductive/Obstetrics ?negative OB ROS ? ?  ? ? ? ? ? ? ? ? ? ? ? ? ? ?  ?  ? ? ? ? ? ? ?Anesthesia Physical ?Anesthesia Plan ? ?ASA: 3 ? ?Anesthesia Plan: General  ? ?Post-op Pain Management: Minimal or no pain anticipated  ? ?Induction: Intravenous ? ?PONV Risk Score and Plan: TIVA ? ?Airway Management Planned: Nasal Cannula and Natural Airway ? ?Additional Equipment:  ? ?Intra-op Plan:  ? ?Post-operative Plan:  ? ?Informed Consent: I have reviewed the patients History and Physical, chart, labs and discussed the procedure including the risks, benefits and alternatives for the proposed anesthesia with the patient or authorized representative who has indicated his/her understanding and acceptance.  ? ? ? ?Dental advisory given ? ?Plan Discussed with: Surgeon ? ?Anesthesia Plan Comments:   ? ? ? ? ? ? ?Anesthesia Quick Evaluation ? ?

## 2021-12-09 NOTE — Transfer of Care (Signed)
Immediate Anesthesia Transfer of Care Note ? ?Patient: Ryan [name] ? ?Procedure(s) Performed: ESOPHAGOGASTRODUODENOSCOPY (EGD) WITH PROPOFOL ?BIOPSY ?SAVORY DILATION ? ?Patient Location: PACU ? ?Anesthesia Type:General ? ?Level of Consciousness: awake, alert , oriented and patient cooperative ? ?Airway & Oxygen Therapy: Patient Spontanous Breathing ? ?Post-op Assessment: Report given to RN, Post -op Vital signs reviewed and stable and Patient moving all extremities X 4 ? ?Post vital signs: Reviewed and stable ? ?Last Vitals:  ?Vitals Value Taken Time  ?BP 98/46 12/09/21 1147  ?Temp    ?Pulse 70 12/09/21 1147  ?Resp 21 12/09/21 1147  ?SpO2 94 % 12/09/21 1147  ? ? ?Last Pain:  ?Vitals:  ? 12/09/21 1147  ?TempSrc: Oral  ?PainSc: 0-No pain  ?   ? ?  ? ?Complications: No notable events documented. ?

## 2021-12-09 NOTE — Interval H&P Note (Signed)
History and Physical Interval Note: ? ?12/09/2021 ?11:11 AM ? ?Ryan [name]  has presented today for surgery, with the diagnosis of Dysphagia.  The various methods of treatment have been discussed with the patient and family. After consideration of risks, benefits and other options for treatment, the patient has consented to  Procedure(s) with comments: ?ESOPHAGOGASTRODUODENOSCOPY (EGD) WITH PROPOFOL ([name]) - 1250 ASA 2 ?ESOPHAGEAL DILATION ([name]) as a surgical intervention.  The patient's history has been reviewed, patient examined, no change in status, stable for surgery.  I have reviewed the patient's chart and labs.  Questions were answered to the patient's satisfaction.   ? ? ?Ryan [name] ? ? ?

## 2021-12-12 ENCOUNTER — Telehealth (INDEPENDENT_AMBULATORY_CARE_PROVIDER_SITE_OTHER): Payer: Self-pay

## 2021-12-12 ENCOUNTER — Other Ambulatory Visit (HOSPITAL_COMMUNITY): Payer: Self-pay | Admitting: Hematology

## 2021-12-12 ENCOUNTER — Other Ambulatory Visit (INDEPENDENT_AMBULATORY_CARE_PROVIDER_SITE_OTHER): Payer: Self-pay | Admitting: Gastroenterology

## 2021-12-12 ENCOUNTER — Other Ambulatory Visit (INDEPENDENT_AMBULATORY_CARE_PROVIDER_SITE_OTHER): Payer: Self-pay

## 2021-12-12 DIAGNOSIS — K2990 Gastroduodenitis, unspecified, without bleeding: Secondary | ICD-10-CM

## 2021-12-12 DIAGNOSIS — K2970 Gastritis, unspecified, without bleeding: Secondary | ICD-10-CM

## 2021-12-12 DIAGNOSIS — Z853 Personal history of malignant neoplasm of breast: Secondary | ICD-10-CM

## 2021-12-12 DIAGNOSIS — R1319 Other dysphagia: Secondary | ICD-10-CM

## 2021-12-12 LAB — SURGICAL PATHOLOGY

## 2021-12-12 MED ORDER — OMEPRAZOLE 40 MG PO CPDR: 40.0000 mg | DELAYED_RELEASE_CAPSULE | Freq: Every day | ORAL | 3 refills | Status: DC

## 2021-12-12 NOTE — Telephone Encounter (Signed)
Harvel Quale, MD  Margaree Mackintosh, CMA ?Hi Darius Bump,  ? ?Can you please schedule a barium esophagram? Dx: dysphagiia  ? ?Thanks,  ? ?Maylon Peppers, MD  ?Gastroenterology and Hepatology  ?[facility] ? ? ? ?Barium Esophagram is scheduled on 12/21/21 at 9:00 am and he is aware  ?

## 2021-12-12 NOTE — Telephone Encounter (Signed)
Thanks

## 2021-12-14 ENCOUNTER — Encounter (HOSPITAL_COMMUNITY): Payer: Self-pay | Admitting: Gastroenterology

## 2021-12-20 DIAGNOSIS — Z20828 Contact with and (suspected) exposure to other viral communicable diseases: Secondary | ICD-10-CM | POA: Diagnosis not present

## 2021-12-21 ENCOUNTER — Ambulatory Visit (HOSPITAL_COMMUNITY)
Admission: RE | Admit: 2021-12-21 | Discharge: 2021-12-21 | Disposition: A | Discharge status: Home / Self Care | Payer: Medicare Other | Source: Ambulatory Visit | Attending: Gastroenterology | Admitting: Gastroenterology

## 2021-12-21 ENCOUNTER — Other Ambulatory Visit: Payer: Self-pay

## 2021-12-21 ENCOUNTER — Other Ambulatory Visit (INDEPENDENT_AMBULATORY_CARE_PROVIDER_SITE_OTHER): Payer: Self-pay | Admitting: Gastroenterology

## 2021-12-21 DIAGNOSIS — R1319 Other dysphagia: Secondary | ICD-10-CM | POA: Diagnosis not present

## 2021-12-21 DIAGNOSIS — R1310 Dysphagia, unspecified: Secondary | ICD-10-CM | POA: Diagnosis not present

## 2021-12-27 ENCOUNTER — Other Ambulatory Visit (HOSPITAL_COMMUNITY): Payer: Self-pay | Admitting: Specialist

## 2021-12-27 DIAGNOSIS — R1312 Dysphagia, oropharyngeal phase: Secondary | ICD-10-CM

## 2021-12-27 DIAGNOSIS — I639 Cerebral infarction, unspecified: Secondary | ICD-10-CM

## 2021-12-27 DIAGNOSIS — Z8571 Personal history of Hodgkin lymphoma: Secondary | ICD-10-CM

## 2021-12-30 DIAGNOSIS — Z20822 Contact with and (suspected) exposure to covid-19: Secondary | ICD-10-CM | POA: Diagnosis not present

## 2022-01-03 ENCOUNTER — Encounter (HOSPITAL_COMMUNITY): Payer: Self-pay

## 2022-01-03 ENCOUNTER — Other Ambulatory Visit: Payer: Self-pay

## 2022-01-03 ENCOUNTER — Other Ambulatory Visit (HOSPITAL_COMMUNITY): Payer: Self-pay | Admitting: Hematology

## 2022-01-03 ENCOUNTER — Ambulatory Visit (HOSPITAL_COMMUNITY)
Admission: RE | Admit: 2022-01-03 | Discharge: 2022-01-03 | Disposition: A | Discharge status: Home / Self Care | Payer: Medicare Other | Source: Ambulatory Visit | Attending: Hematology | Admitting: Hematology

## 2022-01-03 DIAGNOSIS — Z853 Personal history of malignant neoplasm of breast: Secondary | ICD-10-CM

## 2022-01-03 DIAGNOSIS — Z170 Estrogen receptor positive status [ER+]: Secondary | ICD-10-CM

## 2022-01-03 DIAGNOSIS — C50921 Malignant neoplasm of unspecified site of right male breast: Secondary | ICD-10-CM

## 2022-01-03 DIAGNOSIS — C50121 Malignant neoplasm of central portion of right male breast: Secondary | ICD-10-CM | POA: Insufficient documentation

## 2022-01-05 ENCOUNTER — Ambulatory Visit (HOSPITAL_COMMUNITY)
Admission: RE | Admit: 2022-01-05 | Discharge: 2022-01-05 | Disposition: A | Discharge status: Home / Self Care | Payer: Medicare Other | Source: Ambulatory Visit | Attending: Gastroenterology | Admitting: Gastroenterology

## 2022-01-05 ENCOUNTER — Encounter (HOSPITAL_COMMUNITY): Payer: Self-pay | Admitting: Speech Pathology

## 2022-01-05 ENCOUNTER — Ambulatory Visit (HOSPITAL_COMMUNITY): Payer: Medicare Other | Attending: Gastroenterology | Admitting: Speech Pathology

## 2022-01-05 DIAGNOSIS — I639 Cerebral infarction, unspecified: Secondary | ICD-10-CM | POA: Diagnosis not present

## 2022-01-05 DIAGNOSIS — R1312 Dysphagia, oropharyngeal phase: Secondary | ICD-10-CM

## 2022-01-05 DIAGNOSIS — Z8571 Personal history of Hodgkin lymphoma: Secondary | ICD-10-CM | POA: Insufficient documentation

## 2022-01-05 DIAGNOSIS — R1310 Dysphagia, unspecified: Secondary | ICD-10-CM | POA: Diagnosis not present

## 2022-01-05 NOTE — Therapy (Signed)
[facility] ?[address] ?[place], [place], [address] ?Phone: [phone]   Fax:  [phone] ? ?Modified Barium Swallow ? ?Patient Details  ?Name: Ryan [name] ?MRN: [id number] ?Date of Birth: 1951-04-28 ?No data recorded ? ?Encounter Date: 01/05/2022 ? ? End of Session - 01/05/22 1336   ? ? Visit Number 1   ? Number of Visits 1   ? Authorization Type Medicare   ? SLP Start Time 1135   ? SLP Stop Time  1202   ? SLP Time Calculation (min) 27 min   ? Activity Tolerance Patient tolerated treatment well   ? ?  ?  ? ?  ? ? ?Past Medical History:  ?Diagnosis Date  ? Adie's pupil   ? Anemia 07/14/2013  ? Borderline diabetes   ? CAD (coronary artery disease), native coronary artery 04/09/2013  ? Dist LM stenosis 70-80% (at trifurcation into LAD, CX, & 2 Ramus branches, 70% mid RCA);; b) Myoview 08/06/14: Low Risk, no Ischemia/infarction  ? Depression 1983  ? "after I was dx'd w/CA; now just comes in spells"  (12/22/2015)  ? Essential hypertension   ? Exertional shortness of breath   ? Hodgkin's lymphoma ([place]) 1983  ? Hypercholesterolemia   ? Hypothyroidism   ? Invasive ductal carcinoma of breast, stage 2 ([place]) 04/27/2011  ? chemo, mastectomy  ? Left carotid artery partial occlusion 04/27/2011  ? CAROTIC DOPPLER 11/2013: < 40% R ICA stenosis, distal waveforms damped - suggests probably intracranial occlusoin, < 25% LICA, normal vertebrals -- no change  ? Mild aortic stenosis by prior echocardiogram 02/2018  ? By echo June 2020: Mild-Mod AS (Mean Gradient 13.5 mmHg)  ? Mild mitral stenosis by prior echocardiogram 08/06/2014  ? Most recent echo June 2020: Estimated valve area 2.83 cm? (in 2019 was roughly 2 cm?).  Mean gradient 6-8 mmHg.  Appears to be stable.  ? Personal history of chemotherapy   ? Pre-diabetes   ? Restless leg syndrome   ? Right carotid artery occlusion 04/27/2011  ? S/P CABG x 3 04/09/2013  ? LIMA-LAD, fLRAD-RI, SVG-RCA; Echo 10/29/'15:  mild Conc LVH, no WMA, Gr  1 DD, Mod AoV Sclerosis w/ Mod AI, ~Mild-Mod MS   ? Sleep apnea   ? Stroke [facility])   ? 2007  ? Thyroid disease   ? ? ?Past Surgical History:  ?Procedure Laterality Date  ? BIOPSY  09/08/2020  ? Procedure: BIOPSY;  Surgeon: Rogene Houston, MD;  [facility];  Service: Endoscopy;;  colon  ? BIOPSY  12/09/2021  ? Procedure: BIOPSY;  Surgeon: Montez Morita, Quillian Quince, MD;  [facility];  Service: Gastroenterology;;  ? BREAST BIOPSY Right   ? CARDIAC CATHETERIZATION  2014  ? CATARACT EXTRACTION W/PHACO Right 06/21/2015  ? Procedure: CATARACT EXTRACTION PHACO AND INTRAOCULAR LENS PLACEMENT (IOC);  Surgeon: Tonny Branch, MD;  Location: [facility];  Service: Ophthalmology;  Laterality: Right;  CDE: 7.05  ? CATARACT EXTRACTION W/PHACO Left 07/01/2015  ? Procedure: CATARACT EXTRACTION PHACO AND INTRAOCULAR LENS PLACEMENT (IOC);  Surgeon: Tonny Branch, MD;  Location: [facility];  Service: Ophthalmology;  Laterality: Left;  CDE: 7.49  ? COLONOSCOPY [name] 05/06/2015  ? Procedure: COLONOSCOPY;  Surgeon: Rogene Houston, MD;  [facility];  Service: Endoscopy;  Laterality: [name];  930  ? COLONOSCOPY WITH PROPOFOL [name] 09/08/2020  ? Procedure: COLONOSCOPY WITH PROPOFOL;  Surgeon: Rogene Houston, MD;  [facility];  Service: Endoscopy;  Laterality: [name];  915  ? CORONARY ARTERY BYPASS GRAFT [name] 04/08/2013  ? Procedure: CORONARY ARTERY BYPASS GRAFTING (CABG);  Surgeon: Ivin Poot, MD;  Location: [facility];  Service: Open Heart Surgery;  Laterality: [name];  Times 3 using left internal mammary artery; endoscopically harvested right saphenous vein and left radial artery  ? ESOPHAGOGASTRODUODENOSCOPY (EGD) WITH PROPOFOL [name] 12/09/2021  ? Procedure: ESOPHAGOGASTRODUODENOSCOPY (EGD) WITH PROPOFOL;  Surgeon: Harvel Quale, MD;  [facility];  Service: Gastroenterology;  Laterality: [name];  1250 ASA 2  ? INTRAOPERATIVE TRANSESOPHAGEAL ECHOCARDIOGRAM [name] 04/08/2013  ? Procedure: INTRAOPERATIVE  TRANSESOPHAGEAL ECHOCARDIOGRAM;  Surgeon: Ivin Poot, MD;  Location: [facility];  Service: Open Heart Surgery;  Laterality: [name];  ? LAPAROSCOPIC APPENDECTOMY [name] 12/23/2015  ? Procedure: APPENDECTOMY LAPAROSCOPIC;  Surgeon: Donnie Mesa, MD;  Location: [facility];  Service: General;  Laterality: [name];  ? LEFT HEART CATHETERIZATION WITH CORONARY ANGIOGRAM [name] 04/07/2013  ? Procedure: LEFT HEART CATHETERIZATION WITH CORONARY ANGIOGRAM;  Surgeon: Leonie Man, MD;  Location: [facility] C[facility] LAB;  Service: Cardiovascular;  Laterality: [name];  ? LYMPH NODE BIOPSY Left 1983  ? neck  ? MASTECTOMY Right   ? w/axillary lymph node dissection  ? NM MYOVIEW LTD  08/2019  ?  EF 55 to 60%.  LOW RISK.  No ischemia or infarction noted.  ? NM TREADMILL MYOVIEW LTD  08/06/2014  ? Exercise 5:07 min, 6.3 METS; symptoms noted or extreme dyspnea and lightheadedness with some chest tightness. No EKG changes. No ischemia or infarction was normal EF.  ? POLYPECTOMY  09/08/2020  ? Procedure: POLYPECTOMY;  Surgeon: Rogene Houston, MD;  [facility];  Service: Endoscopy;;  ? RADIAL ARTERY HARVEST Left 04/08/2013  ? Procedure: RADIAL ARTERY HARVEST;  Surgeon: Ivin Poot, MD;  Location: [facility];  Service: Open Heart Surgery;  Laterality: Left;  ? SAVORY DILATION  12/09/2021  ? Procedure: SAVORY DILATION;  Surgeon: Montez Morita, Quillian Quince, MD;  [facility];  Service: Gastroenterology;;  ? TONSILLECTOMY  1974  ? TRANSTHORACIC ECHOCARDIOGRAM  08/06/2014; 02/2018  ? a) Nl EF 55-60%. Gr 1 DD-high LVEDP. Mod AoV Sclerosis w/o AS. Mild AI; mild MS - mean grad 7 mmHg @ HR 122 bpm.;; b) Initial: EF 55-60%, GRII DD.  HighLAP/LVEDP.  ? Bicuspid AoV -severely Ca2+.  Mild Ao root dilation.  Severe MAC, mild MS (MVA by P1/2 T & continuity equation ~2 cm?).  Mod RV dilation w/ mild- mod reduced EF.---> Limited w/Defiinity: No RWMA, Mild AS, Mod MS (~mean gradient 8 mmHg  ? TRANSTHORACIC ECHOCARDIOGRAM  02/2020  ? Normal LV size and function.  EF 65  to 70% no R WMA.  Mild LVH with basal septum.  GRII DD with elevated LVEDP.  Moderate reduced RV function with mild RV dilation, but normal PA pressures.  Mild LA dilation.  Moderate mitral stenosis, moderate AS & AI.  Mild MR, Mod MS (mean gradient 9 mmHg).  Moderate calcified AS w/ Mod AI -> Mean gradient 17.3 mmHg with peak 32 mm.  ? TRANSTHORACIC ECHOCARDIOGRAM  03/2019  ?  EF 60-65%, mild concentric LVH. Gr 2 DD. No RWMA. Mild-Mod RV dilation & enlargement. Mild LA dilation. Mod MV Calcification . Mod MS (previously read as mild, but mean gradient is lower than and estimated valve area is higher than last check).  Mild-Mod AS (Mean Gradient 13.5 mmHg)  ? ? ?There were no vitals filed for this visit. ? ? Subjective Assessment - 01/05/22 1327   ? ?  Subjective "It feels like food gets stuck."   ? Special Tests MBSS   ? Currently in Pain? No/denies   ? ?  ?  ? ?  ? ? ? ? ? ? General - 01/05/22 1328   ? ?  ? General Information  ? Date of Onset 11/09/21   ? HPI Ryan [name] is a 71 y.o. male with past medical history of coronary artery disease status post CABG, depression, hypertension, Hodgkin's lymphoma diagnosed in 1983 s/ radiotherapy, hyperlipidemia, hypothyroidism, invasive ductal carcinoma of the breast status postmastectomy and chemotherapy, stroke, sleep apnea, hypothyroidism, who was referred for MBSS by Dr. Maylon Peppers [place]a due to Pt reports of difficulty swallowing solid foods at times with globus sensation in the pharynx. Pt had EGD and Barium Swallow in Feb/March.   ? Type of Study MBS-Modified Barium Swallow Study   ? Diet Prior to this Study Regular;Thin liquids   ? Temperature Spikes Noted No   ? Respiratory Status Room air   ? History of Recent Intubation No   ? Behavior/Cognition Alert;Cooperative;Pleasant mood   ? Oral Cavity Assessment Within Functional Limits;Dry   ? Oral Care Completed by SLP No   ? Oral Cavity - Dentition Edentulous   ? Vision Functional for self feeding   ?  Self-Feeding Abilities Able to feed self   ? Patient Positioning Upright in chair   ? Baseline Vocal Quality Normal   ? Volitional Cough Strong   ? Volitional Swallow Able to elicit   ? Anatomy Within functional limits

## 2022-01-09 DIAGNOSIS — H04123 Dry eye syndrome of bilateral lacrimal glands: Secondary | ICD-10-CM | POA: Diagnosis not present

## 2022-01-11 ENCOUNTER — Inpatient Hospital Stay (HOSPITAL_COMMUNITY): Payer: Medicare Other | Attending: Hematology

## 2022-01-11 DIAGNOSIS — Z9221 Personal history of antineoplastic chemotherapy: Secondary | ICD-10-CM | POA: Diagnosis not present

## 2022-01-11 DIAGNOSIS — Z853 Personal history of malignant neoplasm of breast: Secondary | ICD-10-CM | POA: Insufficient documentation

## 2022-01-11 DIAGNOSIS — Z1509 Genetic susceptibility to other malignant neoplasm: Secondary | ICD-10-CM | POA: Insufficient documentation

## 2022-01-11 DIAGNOSIS — Z1501 Genetic susceptibility to malignant neoplasm of breast: Secondary | ICD-10-CM | POA: Diagnosis not present

## 2022-01-11 DIAGNOSIS — N189 Chronic kidney disease, unspecified: Secondary | ICD-10-CM | POA: Insufficient documentation

## 2022-01-11 DIAGNOSIS — Z9011 Acquired absence of right breast and nipple: Secondary | ICD-10-CM | POA: Diagnosis not present

## 2022-01-11 DIAGNOSIS — Z8571 Personal history of Hodgkin lymphoma: Secondary | ICD-10-CM | POA: Diagnosis not present

## 2022-01-11 DIAGNOSIS — M835 Other drug-induced osteomalacia in adults: Secondary | ICD-10-CM

## 2022-01-11 DIAGNOSIS — D518 Other vitamin B12 deficiency anemias: Secondary | ICD-10-CM

## 2022-01-11 DIAGNOSIS — C50911 Malignant neoplasm of unspecified site of right female breast: Secondary | ICD-10-CM

## 2022-01-11 DIAGNOSIS — R0602 Shortness of breath: Secondary | ICD-10-CM | POA: Insufficient documentation

## 2022-01-11 LAB — CBC WITH DIFFERENTIAL/PLATELET
Abs Immature Granulocytes: 0.05 10*3/uL (ref 0.00–0.07)
Basophils Absolute: 0.1 10*3/uL (ref 0.0–0.1)
Basophils Relative: 1 %
Eosinophils Absolute: 0.3 10*3/uL (ref 0.0–0.5)
Eosinophils Relative: 3 %
HCT: 40.7 % (ref 39.0–52.0)
Hemoglobin: 13.1 g/dL (ref 13.0–17.0)
Immature Granulocytes: 1 %
Lymphocytes Relative: 16 %
Lymphs Abs: 1.3 10*3/uL (ref 0.7–4.0)
MCH: 30 pg (ref 26.0–34.0)
MCHC: 32.2 g/dL (ref 30.0–36.0)
MCV: 93.1 fL (ref 80.0–100.0)
Monocytes Absolute: 0.8 10*3/uL (ref 0.1–1.0)
Monocytes Relative: 10 %
Neutro Abs: 5.7 10*3/uL (ref 1.7–7.7)
Neutrophils Relative %: 69 %
Platelets: 191 10*3/uL (ref 150–400)
RBC: 4.37 MIL/uL (ref 4.22–5.81)
RDW: 13.7 % (ref 11.5–15.5)
WBC: 8.1 10*3/uL (ref 4.0–10.5)
nRBC: 0 % (ref 0.0–0.2)

## 2022-01-11 LAB — COMPREHENSIVE METABOLIC PANEL
ALT: 25 U/L (ref 0–44)
AST: 27 U/L (ref 15–41)
Albumin: 3.9 g/dL (ref 3.5–5.0)
Alkaline Phosphatase: 62 U/L (ref 38–126)
Anion gap: 8 (ref 5–15)
BUN: 17 mg/dL (ref 8–23)
CO2: 25 mmol/L (ref 22–32)
Calcium: 9.1 mg/dL (ref 8.9–10.3)
Chloride: 104 mmol/L (ref 98–111)
Creatinine, Ser: 1.47 mg/dL — ABNORMAL HIGH (ref 0.61–1.24)
GFR, Estimated: 50 mL/min — ABNORMAL LOW (ref >60)
Glucose, Bld: 206 mg/dL — ABNORMAL HIGH (ref 70–99)
Potassium: 4.1 mmol/L (ref 3.5–5.1)
Sodium: 137 mmol/L (ref 135–145)
Total Bilirubin: 0.8 mg/dL (ref 0.3–1.2)
Total Protein: 6.7 g/dL (ref 6.5–8.1)

## 2022-01-11 LAB — VITAMIN D 25 HYDROXY (VIT D DEFICIENCY, FRACTURES): Vit D, 25-Hydroxy: 43.24 ng/mL (ref 30–100)

## 2022-01-11 LAB — FOLATE: Folate: 45.2 ng/mL (ref >5.9)

## 2022-01-11 LAB — VITAMIN B12: Vitamin B-12: 1431 pg/mL — ABNORMAL HIGH (ref 180–914)

## 2022-01-11 LAB — LACTATE DEHYDROGENASE: LDH: 150 U/L (ref 98–192)

## 2022-01-18 ENCOUNTER — Ambulatory Visit (HOSPITAL_COMMUNITY): Payer: Medicare Other | Admitting: Hematology

## 2022-01-23 DIAGNOSIS — Z20822 Contact with and (suspected) exposure to covid-19: Secondary | ICD-10-CM | POA: Diagnosis not present

## 2022-01-25 ENCOUNTER — Ambulatory Visit (HOSPITAL_COMMUNITY)
Admission: RE | Admit: 2022-01-25 | Discharge: 2022-01-25 | Disposition: A | Discharge status: Home / Self Care | Payer: Medicare Other | Source: Ambulatory Visit | Attending: Hematology | Admitting: Hematology

## 2022-01-25 DIAGNOSIS — C50921 Malignant neoplasm of unspecified site of right male breast: Secondary | ICD-10-CM | POA: Diagnosis not present

## 2022-01-25 DIAGNOSIS — N62 Hypertrophy of breast: Secondary | ICD-10-CM | POA: Diagnosis not present

## 2022-01-25 DIAGNOSIS — Z853 Personal history of malignant neoplasm of breast: Secondary | ICD-10-CM | POA: Diagnosis not present

## 2022-01-25 DIAGNOSIS — C50121 Malignant neoplasm of central portion of right male breast: Secondary | ICD-10-CM | POA: Diagnosis not present

## 2022-01-25 DIAGNOSIS — Z170 Estrogen receptor positive status [ER+]: Secondary | ICD-10-CM | POA: Insufficient documentation

## 2022-01-30 ENCOUNTER — Inpatient Hospital Stay (HOSPITAL_COMMUNITY): Payer: Medicare Other | Admitting: Hematology

## 2022-02-02 ENCOUNTER — Other Ambulatory Visit (HOSPITAL_COMMUNITY): Payer: Self-pay | Admitting: *Deleted

## 2022-02-02 ENCOUNTER — Other Ambulatory Visit (HOSPITAL_COMMUNITY): Payer: Self-pay | Admitting: Hematology

## 2022-02-02 ENCOUNTER — Inpatient Hospital Stay (HOSPITAL_BASED_OUTPATIENT_CLINIC_OR_DEPARTMENT_OTHER): Payer: Medicare Other | Admitting: Hematology

## 2022-02-02 VITALS — BP 153/63 | HR 86 | Temp 97.6°F | Resp 18 | Ht 75.0 in | Wt 225.5 lb

## 2022-02-02 DIAGNOSIS — C50421 Malignant neoplasm of upper-outer quadrant of right male breast: Secondary | ICD-10-CM

## 2022-02-02 DIAGNOSIS — Z1501 Genetic susceptibility to malignant neoplasm of breast: Secondary | ICD-10-CM | POA: Diagnosis not present

## 2022-02-02 DIAGNOSIS — Z853 Personal history of malignant neoplasm of breast: Secondary | ICD-10-CM | POA: Diagnosis not present

## 2022-02-02 DIAGNOSIS — C50521 Malignant neoplasm of lower-outer quadrant of right male breast: Secondary | ICD-10-CM

## 2022-02-02 DIAGNOSIS — Z1509 Genetic susceptibility to other malignant neoplasm: Secondary | ICD-10-CM | POA: Diagnosis not present

## 2022-02-02 DIAGNOSIS — C50911 Malignant neoplasm of unspecified site of right female breast: Secondary | ICD-10-CM

## 2022-02-02 DIAGNOSIS — Z9221 Personal history of antineoplastic chemotherapy: Secondary | ICD-10-CM | POA: Diagnosis not present

## 2022-02-02 DIAGNOSIS — Z9011 Acquired absence of right breast and nipple: Secondary | ICD-10-CM | POA: Diagnosis not present

## 2022-02-02 DIAGNOSIS — E559 Vitamin D deficiency, unspecified: Secondary | ICD-10-CM

## 2022-02-02 DIAGNOSIS — Z8571 Personal history of Hodgkin lymphoma: Secondary | ICD-10-CM | POA: Diagnosis not present

## 2022-02-02 NOTE — Progress Notes (Signed)
? ?[facility] ?[address] ?[place], [place] [address] ? ? ?Patient Care Team: ?Redmond School, MD as PCP - General (Internal Medicine) ?Leonie Man, MD as PCP - Cardiology (Cardiology) ? ?SUMMARY OF ONCOLOGIC HISTORY: ?Oncology History  ?Invasive ductal carcinoma of right breast, stage 2- 2012 s/p chemo and mastectomy  ?12/14/2010 Initial Diagnosis  ? Right needle core biopsy at 9 o'clock position positive for invasive ductal carcinoma ? ?  ?01/17/2011 Surgery  ? Right simple mastectomy showing a 2.1 cm invasive ductal carcinoma, clear marhins, 0/2 lymph nodes.  ER 96%, PR 84%, Ki-67 19%, Her2 negative. ? ?  ?03/08/2011 - 03/09/2016 Chemotherapy  ? Tamoxifen x 5 years ?  ?08/17/2014 Genetic Testing  ? Patient has genetic testing done forBreast/Ovarian cancer panel through [facility] . 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  ? ?  ?08/09/2015 Pathology Results  ? [facility]- Low risk of late recurrence years 5-10 (3.7%), low likelihood of benefit from extended endocrine therapy, low risk fo overall recurrence years 0-10 (6.6%), and low liklihood of benefit from extended endocrine therapy. ? ?  ?Hodgkin's [phone]  ?04/27/2011 Initial Diagnosis  ? Hodgkin's [phone] ? ?  ? ? ?CHIEF COMPLIANT: Follow-up for right breast cancer ? ? ?INTERVAL HISTORY: [name] is a 71 y.o. male here today for follow up of his right breast cancer. His last visit was on 01/18/2021.  ? ?Today he reports feeling well. He reports chronic SOB with exertion. He denies family history of breast cancer.  ? ?REVIEW OF SYSTEMS:   ?Review of Systems  ?Constitutional:  Positive for fatigue. Negative for appetite change.  ?HENT:   Positive for trouble swallowing.   ?Respiratory:  Positive for shortness of breath.   ?Neurological:  Positive for dizziness.  ?Psychiatric/Behavioral:  Positive for depression and sleep disturbance.   ?All other systems reviewed and are  negative. ? ?I have reviewed the past medical history, past surgical history, social history and family history with the patient and they are unchanged from previous note. ? ? ?ALLERGIES:   ?has No Known Allergies. ? ? ?MEDICATIONS:  ?Current Outpatient Medications  ?Medication Sig Dispense Refill  ? acetaminophen (TYLENOL) 500 MG tablet Take 500 mg by mouth every 6 (six) hours as needed for mild pain.     ? albuterol (PROVENTIL HFA;VENTOLIN HFA) 108 (90 Base) MCG/ACT inhaler Inhale 2 puffs into the lungs every 6 (six) hours as needed for wheezing or shortness of breath.    ? albuterol (PROVENTIL) 2 MG tablet Take 2 mg by mouth daily.    ? ALPRAZolam (XANAX) 1 MG tablet Take 0.5-1 mg by mouth at bedtime.    ? Artificial Tear Solution (SOOTHE XP OP) Place 1 drop into both eyes daily as needed (dry eyes).    ? aspirin EC 81 MG tablet Take 81 mg by mouth every evening. 90 tablet 3  ? Cholecalciferol (VITAMIN D) 50 MCG (2000 UT) tablet Take 2,000 Units by mouth daily.    ? Cinnamon 500 MG capsule Take 1,000 mg by mouth 2 (two) times daily.    ? citalopram (CELEXA) 20 MG tablet Take 40 mg by mouth every evening.     ? diphenhydrAMINE (BENADRYL) 25 MG tablet Take 25 mg by mouth daily as needed for allergies.    ? docusate sodium (COLACE) 100 MG capsule Take 100 mg by mouth daily.    ? Ferrous Sulfate (IRON) 28 MG TABS Take  28 mg by mouth daily.    ? Garlic 6244 MG CAPS Take 1,000 mg by mouth daily.    ? Ginkgo Biloba 60 MG CAPS Take 60 mg by mouth daily.    ? levothyroxine (SYNTHROID, LEVOTHROID) 75 MCG tablet Take 75 mcg by mouth daily before breakfast.     ? Magnesium 500 MG TABS Take 500 mg by mouth 2 (two) times a week.    ? Menthol-Methyl Salicylate (MUSCLE RUB) 10-15 % CREA Apply 1 application topically as needed for muscle pain.    ? nitroGLYCERIN (NITROSTAT) 0.4 MG SL tablet Place 1 tablet (0.4 mg total) under the tongue every 5 (five) minutes as needed for chest pain. 25 tablet 12  ? Omega-3 Fatty Acids (SALMON  OIL-1000 PO) Take 1,000 mg by mouth daily.    ? omeprazole (PRILOSEC) 40 MG capsule Take 1 capsule (40 mg total) by mouth daily. 90 capsule 3  ? OVER THE COUNTER MEDICATION Take 2 tablets by mouth daily. Ultra kidney complex    ? OVER THE COUNTER MEDICATION Take 2 tablets by mouth daily. Liver 10 complex otc supplement    ? OVER THE COUNTER MEDICATION Psyllium 500 mg daily.    ? OVER THE COUNTER MEDICATION Take 1 tablet by mouth daily. Neuro PS (phosphatidylserine)  otc supplement    ? OVER THE COUNTER MEDICATION Take 1 tablet by mouth daily. Advanced memory complex    ? OVER THE COUNTER MEDICATION Take 1 tablet by mouth daily as needed (constipation). Herbal laxative otc supplement    ? PSYLLIUM PO Take 500 mg by mouth daily.    ? rOPINIRole (REQUIP) 4 MG tablet Take 4 mg by mouth in the morning and at bedtime.    ? simvastatin (ZOCOR) 80 MG tablet Take 80 mg by mouth every evening.     ? TURMERIC PO Take 454 mg by mouth daily.    ? vitamin B-12 (CYANOCOBALAMIN) 500 MCG tablet Take 500 mcg by mouth daily.    ? ?No current facility-administered medications for this visit.  ? ? ? ?PHYSICAL EXAMINATION: ?Performance status (ECOG): 1 - Symptomatic but completely ambulatory ? ?There were no vitals filed for this visit. ?Wt Readings from Last 3 Encounters:  ?12/09/21 231 lb 14.8 oz (105.2 kg)  ?12/07/21 232 lb (105.2 kg)  ?11/28/21 232 lb 3.2 oz (105.3 kg)  ? ?Physical Exam ?Vitals reviewed.  ?Constitutional:   ?   Appearance: Normal appearance.  ?Cardiovascular:  ?   Rate and Rhythm: Normal rate and regular rhythm.  ?   Pulses: Normal pulses.  ?   Heart sounds: Murmur heard.  ?Systolic murmur is present.  ?Pulmonary:  ?   Effort: Pulmonary effort is normal.  ?   Breath sounds: Normal breath sounds.  ?Abdominal:  ?   Palpations: Abdomen is soft. There is no hepatomegaly, splenomegaly or mass.  ?   Tenderness: There is no abdominal tenderness.  ?Neurological:  ?   General: No focal deficit present.  ?   Mental Status: He  is alert and oriented to person, place, and time.  ?Psychiatric:     ?   Mood and Affect: Mood normal.     ?   Behavior: Behavior normal.  ? ? ?Breast Exam Chaperone: Thana Ates   ? ? ?LABORATORY DATA:  ?I have reviewed the data as listed ? ?  Latest Ref Rng & Units 01/11/2022  ? 11:02 AM 01/11/2021  ?  9:41 AM 09/07/2020  ?  9:34 AM  ?CMP  ?  Glucose 70 - 99 mg/dL 206   132   123    ?BUN 8 - 23 mg/dL 17   19   18     ?Creatinine 0.61 - 1.24 mg/dL 1.47   1.41   1.36    ?Sodium 135 - 145 mmol/L 137   138   138    ?Potassium 3.5 - 5.1 mmol/L 4.1   4.5   4.6    ?Chloride 98 - 111 mmol/L 104   104   102    ?CO2 22 - 32 mmol/L 25   26   27     ?Calcium 8.9 - 10.3 mg/dL 9.1   8.8   9.2    ?Total Protein 6.5 - 8.1 g/dL 6.7   6.8     ?Total Bilirubin 0.3 - 1.2 mg/dL 0.8   0.9     ?Alkaline Phos 38 - 126 U/L 62   68     ?AST 15 - 41 U/L 27   34     ?ALT 0 - 44 U/L 25   42     ? ?No results found for: [id number] ?Lab Results  ?Component Value Date  ? WBC 8.1 01/11/2022  ? HGB 13.1 01/11/2022  ? HCT 40.7 01/11/2022  ? MCV 93.1 01/11/2022  ? PLT 191 01/11/2022  ? NEUTROABS 5.7 01/11/2022  ? ? ?ASSESSMENT:  ?1.  Stage II invasive ductal carcinoma the right breast: ?- He was initially diagnosed 12/14/2010 with invasive ductal carcinoma the right breast ER+/HER2- ?- Had a biopsy done on 01/17/2011. ?- He had a right simple mastectomy on 03/08/2011 showing a 2.1 cm invasive ductal carcinoma with clear margins, 0 out of 2 lymph nodes.  ER 96%, PR 84%, Ki-67 19%, HER-2 negative. ?- He completed his chemotherapy and started tamoxifen in May 2012 and completed May 2017. ?- Oncotype score was 14. ?-Last mammogram on 01/06/2020 was BI-RADS category 2 benign. ?-Completed 5 years of tamoxifen in 2017. ?  ?2.  Hodgkin lymphoma: ?- He was treated in 1983 with mantle radiation. ?- He was diagnosed with a right lymph node biopsy. ? ? ?PLAN:  ?1.  Stage II invasive ductal carcinoma the right breast: ?- Right mastectomy site is within normal limits.  No  palpable masses in the left breast.  No palpable adenopathy. ?- Reviewed labs from 01/11/2022 which showed normal CBC and LFTs.  Vitamin D is normal. ?- Mammogram of the left breast was BI-RADS Category 2 on 4

## 2022-02-07 DIAGNOSIS — Z20822 Contact with and (suspected) exposure to covid-19: Secondary | ICD-10-CM | POA: Diagnosis not present

## 2022-02-13 DIAGNOSIS — Z20822 Contact with and (suspected) exposure to covid-19: Secondary | ICD-10-CM | POA: Diagnosis not present

## 2022-05-19 DIAGNOSIS — Z853 Personal history of malignant neoplasm of breast: Secondary | ICD-10-CM | POA: Diagnosis not present

## 2022-05-19 DIAGNOSIS — R42 Dizziness and giddiness: Secondary | ICD-10-CM | POA: Diagnosis not present

## 2022-05-19 DIAGNOSIS — D508 Other iron deficiency anemias: Secondary | ICD-10-CM | POA: Diagnosis not present

## 2022-05-19 DIAGNOSIS — Z0001 Encounter for general adult medical examination with abnormal findings: Secondary | ICD-10-CM | POA: Diagnosis not present

## 2022-05-19 DIAGNOSIS — E1129 Type 2 diabetes mellitus with other diabetic kidney complication: Secondary | ICD-10-CM | POA: Diagnosis not present

## 2022-05-19 DIAGNOSIS — D500 Iron deficiency anemia secondary to blood loss (chronic): Secondary | ICD-10-CM | POA: Diagnosis not present

## 2022-05-19 DIAGNOSIS — Z6827 Body mass index (BMI) 27.0-27.9, adult: Secondary | ICD-10-CM | POA: Diagnosis not present

## 2022-05-19 DIAGNOSIS — I10 Essential (primary) hypertension: Secondary | ICD-10-CM | POA: Diagnosis not present

## 2022-05-19 DIAGNOSIS — N183 Chronic kidney disease, stage 3 unspecified: Secondary | ICD-10-CM | POA: Diagnosis not present

## 2022-05-19 DIAGNOSIS — E063 Autoimmune thyroiditis: Secondary | ICD-10-CM | POA: Diagnosis not present

## 2022-05-30 DIAGNOSIS — Z1211 Encounter for screening for malignant neoplasm of colon: Secondary | ICD-10-CM | POA: Diagnosis not present

## 2022-07-24 DIAGNOSIS — Z23 Encounter for immunization: Secondary | ICD-10-CM | POA: Diagnosis not present

## 2022-07-26 DIAGNOSIS — Z1331 Encounter for screening for depression: Secondary | ICD-10-CM | POA: Diagnosis not present

## 2022-07-26 DIAGNOSIS — I10 Essential (primary) hypertension: Secondary | ICD-10-CM | POA: Diagnosis not present

## 2022-07-26 DIAGNOSIS — R351 Nocturia: Secondary | ICD-10-CM | POA: Diagnosis not present

## 2022-07-26 DIAGNOSIS — N401 Enlarged prostate with lower urinary tract symptoms: Secondary | ICD-10-CM | POA: Diagnosis not present

## 2022-07-26 DIAGNOSIS — Z6826 Body mass index (BMI) 26.0-26.9, adult: Secondary | ICD-10-CM | POA: Diagnosis not present

## 2022-07-26 DIAGNOSIS — H8113 Benign paroxysmal vertigo, bilateral: Secondary | ICD-10-CM | POA: Diagnosis not present

## 2022-07-26 DIAGNOSIS — D518 Other vitamin B12 deficiency anemias: Secondary | ICD-10-CM | POA: Diagnosis not present

## 2022-07-26 DIAGNOSIS — N1831 Chronic kidney disease, stage 3a: Secondary | ICD-10-CM | POA: Diagnosis not present

## 2022-07-26 DIAGNOSIS — Z0001 Encounter for general adult medical examination with abnormal findings: Secondary | ICD-10-CM | POA: Diagnosis not present

## 2022-07-26 DIAGNOSIS — C50921 Malignant neoplasm of unspecified site of right male breast: Secondary | ICD-10-CM | POA: Diagnosis not present

## 2022-07-26 DIAGNOSIS — E559 Vitamin D deficiency, unspecified: Secondary | ICD-10-CM | POA: Diagnosis not present

## 2022-07-26 DIAGNOSIS — E663 Overweight: Secondary | ICD-10-CM | POA: Diagnosis not present

## 2022-07-26 DIAGNOSIS — E063 Autoimmune thyroiditis: Secondary | ICD-10-CM | POA: Diagnosis not present

## 2022-07-26 DIAGNOSIS — E1129 Type 2 diabetes mellitus with other diabetic kidney complication: Secondary | ICD-10-CM | POA: Diagnosis not present

## 2022-07-26 DIAGNOSIS — E039 Hypothyroidism, unspecified: Secondary | ICD-10-CM | POA: Diagnosis not present

## 2022-07-26 DIAGNOSIS — Z125 Encounter for screening for malignant neoplasm of prostate: Secondary | ICD-10-CM | POA: Diagnosis not present

## 2022-10-19 DIAGNOSIS — E663 Overweight: Secondary | ICD-10-CM | POA: Diagnosis not present

## 2022-10-19 DIAGNOSIS — J069 Acute upper respiratory infection, unspecified: Secondary | ICD-10-CM | POA: Diagnosis not present

## 2022-10-19 DIAGNOSIS — Z6825 Body mass index (BMI) 25.0-25.9, adult: Secondary | ICD-10-CM | POA: Diagnosis not present

## 2022-10-25 ENCOUNTER — Other Ambulatory Visit (HOSPITAL_COMMUNITY): Payer: Self-pay | Admitting: Internal Medicine

## 2022-10-25 DIAGNOSIS — R051 Acute cough: Secondary | ICD-10-CM

## 2023-01-11 DIAGNOSIS — H43393 Other vitreous opacities, bilateral: Secondary | ICD-10-CM | POA: Diagnosis not present

## 2023-01-17 DIAGNOSIS — N1831 Chronic kidney disease, stage 3a: Secondary | ICD-10-CM | POA: Diagnosis not present

## 2023-01-17 DIAGNOSIS — C50921 Malignant neoplasm of unspecified site of right male breast: Secondary | ICD-10-CM | POA: Diagnosis not present

## 2023-01-17 DIAGNOSIS — F419 Anxiety disorder, unspecified: Secondary | ICD-10-CM | POA: Diagnosis not present

## 2023-01-17 DIAGNOSIS — Z6824 Body mass index (BMI) 24.0-24.9, adult: Secondary | ICD-10-CM | POA: Diagnosis not present

## 2023-01-17 DIAGNOSIS — F329 Major depressive disorder, single episode, unspecified: Secondary | ICD-10-CM | POA: Diagnosis not present

## 2023-01-17 DIAGNOSIS — E1129 Type 2 diabetes mellitus with other diabetic kidney complication: Secondary | ICD-10-CM | POA: Diagnosis not present

## 2023-01-17 DIAGNOSIS — I10 Essential (primary) hypertension: Secondary | ICD-10-CM | POA: Diagnosis not present

## 2023-01-30 ENCOUNTER — Encounter (HOSPITAL_COMMUNITY): Payer: Self-pay

## 2023-01-30 ENCOUNTER — Ambulatory Visit (HOSPITAL_COMMUNITY)
Admission: RE | Admit: 2023-01-30 | Discharge: 2023-01-30 | Disposition: A | Discharge status: Home / Self Care | Payer: Medicare Other | Source: Ambulatory Visit | Attending: Hematology | Admitting: Hematology

## 2023-01-30 ENCOUNTER — Inpatient Hospital Stay: Payer: Medicare Other | Attending: Hematology

## 2023-01-30 DIAGNOSIS — Z853 Personal history of malignant neoplasm of breast: Secondary | ICD-10-CM | POA: Insufficient documentation

## 2023-01-30 DIAGNOSIS — C50421 Malignant neoplasm of upper-outer quadrant of right male breast: Secondary | ICD-10-CM

## 2023-01-30 DIAGNOSIS — Z9011 Acquired absence of right breast and nipple: Secondary | ICD-10-CM | POA: Diagnosis not present

## 2023-01-30 DIAGNOSIS — K5900 Constipation, unspecified: Secondary | ICD-10-CM | POA: Insufficient documentation

## 2023-01-30 DIAGNOSIS — R634 Abnormal weight loss: Secondary | ICD-10-CM | POA: Diagnosis not present

## 2023-01-30 DIAGNOSIS — E559 Vitamin D deficiency, unspecified: Secondary | ICD-10-CM

## 2023-01-30 DIAGNOSIS — G629 Polyneuropathy, unspecified: Secondary | ICD-10-CM | POA: Insufficient documentation

## 2023-01-30 DIAGNOSIS — C50911 Malignant neoplasm of unspecified site of right female breast: Secondary | ICD-10-CM | POA: Diagnosis not present

## 2023-01-30 DIAGNOSIS — R0609 Other forms of dyspnea: Secondary | ICD-10-CM | POA: Insufficient documentation

## 2023-01-30 DIAGNOSIS — Z9221 Personal history of antineoplastic chemotherapy: Secondary | ICD-10-CM | POA: Insufficient documentation

## 2023-01-30 DIAGNOSIS — Z8572 Personal history of non-Hodgkin lymphomas: Secondary | ICD-10-CM | POA: Insufficient documentation

## 2023-01-30 DIAGNOSIS — G2581 Restless legs syndrome: Secondary | ICD-10-CM | POA: Diagnosis not present

## 2023-01-30 DIAGNOSIS — N1831 Chronic kidney disease, stage 3a: Secondary | ICD-10-CM | POA: Insufficient documentation

## 2023-01-30 DIAGNOSIS — N62 Hypertrophy of breast: Secondary | ICD-10-CM | POA: Diagnosis not present

## 2023-01-30 DIAGNOSIS — Z87891 Personal history of nicotine dependence: Secondary | ICD-10-CM | POA: Diagnosis not present

## 2023-01-30 DIAGNOSIS — N182 Chronic kidney disease, stage 2 (mild): Secondary | ICD-10-CM | POA: Insufficient documentation

## 2023-01-30 LAB — COMPREHENSIVE METABOLIC PANEL
ALT: 38 U/L (ref 0–44)
AST: 32 U/L (ref 15–41)
Albumin: 3.9 g/dL (ref 3.5–5.0)
Alkaline Phosphatase: 63 U/L (ref 38–126)
Anion gap: 7 (ref 5–15)
BUN: 17 mg/dL (ref 8–23)
CO2: 28 mmol/L (ref 22–32)
Calcium: 9.4 mg/dL (ref 8.9–10.3)
Chloride: 103 mmol/L (ref 98–111)
Creatinine, Ser: 1.17 mg/dL (ref 0.61–1.24)
GFR, Estimated: >60 mL/min (ref >60)
Glucose, Bld: 152 mg/dL — ABNORMAL HIGH (ref 70–99)
Potassium: 4.3 mmol/L (ref 3.5–5.1)
Sodium: 138 mmol/L (ref 135–145)
Total Bilirubin: 1.1 mg/dL (ref 0.3–1.2)
Total Protein: 6.6 g/dL (ref 6.5–8.1)

## 2023-01-30 LAB — CBC WITH DIFFERENTIAL/PLATELET
Abs Immature Granulocytes: 0.02 10*3/uL (ref 0.00–0.07)
Basophils Absolute: 0.1 10*3/uL (ref 0.0–0.1)
Basophils Relative: 1 %
Eosinophils Absolute: 0.3 10*3/uL (ref 0.0–0.5)
Eosinophils Relative: 4 %
HCT: 42.9 % (ref 39.0–52.0)
Hemoglobin: 13.9 g/dL (ref 13.0–17.0)
Immature Granulocytes: 0 %
Lymphocytes Relative: 17 %
Lymphs Abs: 1.2 10*3/uL (ref 0.7–4.0)
MCH: 30.3 pg (ref 26.0–34.0)
MCHC: 32.4 g/dL (ref 30.0–36.0)
MCV: 93.5 fL (ref 80.0–100.0)
Monocytes Absolute: 0.7 10*3/uL (ref 0.1–1.0)
Monocytes Relative: 9 %
Neutro Abs: 5 10*3/uL (ref 1.7–7.7)
Neutrophils Relative %: 69 %
Platelets: 226 10*3/uL (ref 150–400)
RBC: 4.59 MIL/uL (ref 4.22–5.81)
RDW: 13.2 % (ref 11.5–15.5)
WBC: 7.3 10*3/uL (ref 4.0–10.5)
nRBC: 0 % (ref 0.0–0.2)

## 2023-01-30 LAB — VITAMIN D 25 HYDROXY (VIT D DEFICIENCY, FRACTURES): Vit D, 25-Hydroxy: 34.03 ng/mL (ref 30–100)

## 2023-02-05 NOTE — Progress Notes (Unsigned)
[facility] [address], [place] [address]   CLINIC:  Medical Oncology/Hematology  PCP:  Elfredia Nevins, MD [address] / [place] [address] [phone]   REASON FOR VISIT:  Follow-up for history of stage II invasive ductal carcinoma of the right breast  PRIOR THERAPY: - Right simple mastectomy (01/17/2011) - Tamoxifen x 5 years  NGS Results: CHEK2 mutation  CURRENT THERAPY: Surveillance  BRIEF ONCOLOGIC HISTORY:   Oncology History  Invasive ductal carcinoma of right breast, stage 2- 2012 s/p chemo and mastectomy  12/14/2010 Initial Diagnosis   Right needle core biopsy at 9 o'clock position positive for invasive ductal carcinoma   01/17/2011 Surgery   Right simple mastectomy showing a 2.1 cm invasive ductal carcinoma, clear marhins, 0/2 lymph nodes.  ER 96%, PR 84%, Ki-67 19%, Her2 negative.   03/08/2011 - 03/09/2016 Chemotherapy   Tamoxifen x 5 years   08/17/2014 Genetic Testing   Patient has genetic testing done forBreast/Ovarian cancer panel through [facility] . 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    08/09/2015 Pathology Results   BCI- Low risk of late recurrence years 5-10 (3.7%), low likelihood of benefit from extended endocrine therapy, low risk fo overall recurrence years 0-10 (6.6%), and low liklihood of benefit from extended endocrine therapy.   Hodgkin's [id number]  04/27/2011 Initial Diagnosis   Hodgkin's [id number]     CANCER STAGING: Cancer Staging  Invasive ductal carcinoma of right breast, stage 2- 2012 s/p chemo and mastectomy Staging form: Breast, AJCC 7th Edition - Clinical: Stage IIA (T2, N0, cM0) - Signed by Ellouise Newer, PA on 04/27/2011   INTERVAL HISTORY:   Ryan [name], a 72 y.o. male, returns for routine follow-up of his history of right-sided breast cancer. Ryan [name] was last seen on 02/02/2022 by Dr. Ellin Saba.   At today's  visit, he  reports feeling fair.  He denies any recent hospitalizations, surgeries, or changes in his  baseline health status.  He has had 30 pounds of unintentional weight loss over the past 6 months.  He denies any changes in eating habits, but reports that he thought weight loss was related to overall stress.  He has chronic night sweats.  He denies any unexplained fevers or chills.  He denies any new onset cough.  He has not noticed any new pain, lymphadenopathy, or masses.  He reports increased constipation.  He reports chronic restless legs, neuropathy, chronic cough, and chronic dyspnea on exertion.  He denies any new onset headaches, seizures, or focal neurologic deficits.  He reports 40% energy and 100% appetite.  He is maintaining stable weight at this tim.  ASSESSMENT & PLAN:  1.   Stage II invasive ductal carcinoma the right breast - He was initially diagnosed 12/14/2010 with invasive ductal carcinoma the right breast ER+/HER2- - Had a biopsy done on 01/17/2011. - He had a right simple mastectomy on 03/08/2011 showing a 2.1 cm invasive ductal carcinoma with clear margins, 0 out of 2 lymph nodes.  ER 96%, PR 84%, Ki-67 19%, HER-2 negative. - NGS panel revealed CHEK2 mutation - He completed his chemotherapy and started tamoxifen in May 2012 and completed May 2017. - Oncotype score was 14. - Completed 5 years of tamoxifen in 2017. - Right mastectomy site is within normal limits.  No palpable masses in the left breast.  No palpable adenopathy. - Reviewed labs from 01/30/2023 which showed normal CBC and LFTs.  Vitamin D is normal. - Mammogram of the left breast (01/30/2023) was BI-RADS Category 2, benign - RTC 1 year for follow-up with repeat labs and mammogram.  - PLAN: If PET scan results (see below) are normal, we will plan on Labs (CBC/D, vitamin D, CMP) + repeat mammogram (MM 3D diagnostic mammogram unilateral left) in 1 year with office visit - Discussed importance of genetic testing for  CHEK2 mutation in patient's two daughters  2.  Unintentional weight loss - Patient has unintentionally lost 30 pounds in the past 6 months (13.3% body weight) - Reports increased stress but denies any changes in eating habits. - Admits to chronic night sweats, chronic cough, and baseline DOE - Reports increased constipation. - He had EGD on 12/09/2021 which showed gastritis and duodenitis, s/p esophageal dilation for dysphagia - Last colonoscopy in 2021 with polyps x 2 (tubular adenoma), melanosis of the colon, diverticulosis, external hemorrhoids - Physical exam (02/06/2023) negative for masses or lymphadenopathy, patient does have loose skin and some temporal muscle wasting as evidence of recent rapid weight loss - PLAN: Patient considered high risk for occult malignancy given his history of lymphoma, breast cancer, and CHEK2 mutation. - Per discussion with Dr. Ellin Saba we will check PET scan for further evaluation of unintentional weight loss.  3.   Hodgkin lymphoma - He was treated in 1983 with mantle radiation. - He was diagnosed with a right lymph node biopsy. - No palpable adenopathy or cytopenias.  4.  CKD stage II/IIIa - Creatinine is 1.17, improved from baseline  PLAN SUMMARY: >> PET scan next week >> Further plan TBD pending results - will call patient to discuss after completion of PET scan    REVIEW OF SYSTEMS:   Review of Systems  Constitutional:  Positive for fatigue and unexpected weight change. Negative for appetite change, chills, diaphoresis and fever.  HENT:   Negative for lump/mass and nosebleeds.   Eyes:  Negative for eye problems.  Respiratory:  Positive for cough and shortness of breath. Negative for hemoptysis.   Cardiovascular:  Negative for chest pain, leg swelling and palpitations.  Gastrointestinal:  Negative for abdominal pain, blood in stool, constipation, diarrhea, nausea and vomiting.  Genitourinary:  Positive for nocturia. Negative for hematuria.    Skin: Negative.   Neurological:  Positive for dizziness and numbness. Negative for headaches and light-headedness.  Hematological:  Does not bruise/bleed easily.  Psychiatric/Behavioral:  Positive for depression and sleep disturbance.     PHYSICAL EXAM:   Performance status (ECOG): 1 - Symptomatic but completely ambulatory  There were no vitals filed for this visit. Wt Readings from Last 3 Encounters:  02/02/22 225 lb 8 oz (102.3 kg)  12/09/21 231 lb 14.8 oz (105.2 kg)  12/07/21 232 lb (105.2 kg)   Physical Exam Constitutional:      Appearance: Normal appearance. He is normal weight.     Comments: Mild muscle wasting and loose skin is evidence of recent weight loss  Cardiovascular:     Heart sounds: Normal heart sounds.  Pulmonary:     Breath sounds: Normal breath sounds.  Chest:     Comments: S/p right mastectomy.  No masses of left breast.  No lymphadenopathy of bilateral supraclavicular, axillary, or pectoral regions. Neurological:     General: No focal deficit present.     Mental Status: Mental status is at baseline.  Psychiatric:        Behavior: Behavior normal. Behavior is cooperative.      PAST MEDICAL/SURGICAL HISTORY:  Past Medical History:  Diagnosis Date   Adie's pupil    Anemia 07/14/2013   Borderline diabetes    CAD (coronary artery disease), native coronary artery 04/09/2013   Dist LM stenosis 70-80% (at trifurcation into LAD, CX, & 2 Ramus branches, 70% mid RCA);; b) Myoview 08/06/14: Low Risk, no Ischemia/infarction   Depression 1983   "after I was dx'd w/CA; now just comes in spells"  (12/22/2015)   Essential hypertension    Exertional shortness of breath    Hodgkin's lymphoma (HCC) 1983   Hypercholesterolemia    Hypothyroidism    Invasive ductal carcinoma of breast, stage 2 (HCC) 04/27/2011   chemo, mastectomy   Left carotid artery partial occlusion 04/27/2011   CAROTIC DOPPLER 11/2013: < 40% R ICA stenosis, distal waveforms damped - suggests  probably intracranial occlusoin, < 40% LICA, normal vertebrals -- no change   Mild aortic stenosis by prior echocardiogram 02/2018   By echo June 2020: Mild-Mod AS (Mean Gradient 13.5 mmHg)   Mild mitral stenosis by prior echocardiogram 08/06/2014   Most recent echo June 2020: Estimated valve area 2.83 cm (in 2019 was roughly 2 cm).  Mean gradient 6-8 mmHg.  Appears to be stable.   Personal history of chemotherapy    Pre-diabetes    Restless leg syndrome    Right carotid artery occlusion 04/27/2011   S/P CABG x 3 04/09/2013   LIMA-LAD, fLRAD-RI, SVG-RCA; Echo 10/29/'15:  mild Conc LVH, no WMA, Gr 1 DD, Mod AoV Sclerosis w/ Mod AI, ~Mild-Mod MS    Sleep apnea    Stroke [facility])    2007   Thyroid disease    Past Surgical History:  Procedure Laterality Date   BIOPSY  09/08/2020   Procedure: BIOPSY;  Surgeon: Malissa Hippo, MD;  [facility];  Service: Endoscopy;;  colon   BIOPSY  12/09/2021   Procedure: BIOPSY;  Surgeon: Dolores Frame, MD;  [facility]: [facility];  Service: Gastroenterology;;   BREAST BIOPSY Right    CARDIAC CATHETERIZATION  2014   CATARACT EXTRACTION W/PHACO Right 06/21/2015   Procedure: CATARACT EXTRACTION PHACO AND INTRAOCULAR LENS PLACEMENT (IOC);  Surgeon: Gemma Payor, MD;  Location: [facility];  Service: Ophthalmology;  Laterality: Right;  CDE: 7.05   CATARACT EXTRACTION W/PHACO Left 07/01/2015   Procedure: CATARACT EXTRACTION PHACO AND INTRAOCULAR LENS PLACEMENT (IOC);  Surgeon: Gemma Payor, MD;  Location: [facility];  Service: Ophthalmology;  Laterality: Left;  CDE: 7.49   COLONOSCOPY [name] 05/06/2015   Procedure: COLONOSCOPY;  Surgeon: Malissa Hippo, MD;  [facility];  Service: Endoscopy;  Laterality: [name];  930   COLONOSCOPY WITH PROPOFOL [name] 09/08/2020   Procedure: COLONOSCOPY WITH PROPOFOL;  Surgeon: Malissa Hippo, MD;  [facility];  Service: Endoscopy;  Laterality: [name];  915   CORONARY ARTERY BYPASS GRAFT [name]  04/08/2013   Procedure: CORONARY ARTERY BYPASS GRAFTING (CABG);  Surgeon: Kerin Perna, MD;  Location: [facility] OR;  Service: Open Heart Surgery;  Laterality: [name];  Times 3 using left internal mammary artery; endoscopically harvested right saphenous vein and left radial artery   ESOPHAGOGASTRODUODENOSCOPY (EGD) WITH PROPOFOL [name] 12/09/2021   Procedure: ESOPHAGOGASTRODUODENOSCOPY (EGD) WITH PROPOFOL;  Surgeon: Dolores Frame, MD;  [facility];  Service: Gastroenterology;  Laterality: [name];  1250 ASA 2   INTRAOPERATIVE TRANSESOPHAGEAL ECHOCARDIOGRAM [name] 04/08/2013   Procedure: INTRAOPERATIVE TRANSESOPHAGEAL ECHOCARDIOGRAM;  Surgeon: Kerin Perna, MD;  Location: [facility] OR;  Service: Open Heart Surgery;  Laterality:  [name];   LAPAROSCOPIC APPENDECTOMY [name] 12/23/2015   Procedure: APPENDECTOMY LAPAROSCOPIC;  Surgeon: Manus Rudd, MD;  Location: [facility] OR;  Service: General;  Laterality: [name];   LEFT HEART CATHETERIZATION WITH CORONARY ANGIOGRAM [name] 04/07/2013   Procedure: LEFT HEART CATHETERIZATION WITH CORONARY ANGIOGRAM;  Surgeon: Marykay Lex, MD;  Location: [facility] CATH LAB;  Service: Cardiovascular;  Laterality: [name];   LYMPH NODE BIOPSY Left 1983   neck   MASTECTOMY Right 2012   w/axillary lymph node dissection   NM MYOVIEW LTD  08/2019    EF 55 to 60%.  LOW RISK.  No ischemia or infarction noted.   NM TREADMILL MYOVIEW LTD  08/06/2014   Exercise 5:07 min, 6.3 METS; symptoms noted or extreme dyspnea and lightheadedness with some chest tightness. No EKG changes. No ischemia or infarction was normal EF.   POLYPECTOMY  09/08/2020   Procedure: POLYPECTOMY;  Surgeon: Malissa Hippo, MD;  [facility];  Service: Endoscopy;;   RADIAL ARTERY HARVEST Left 04/08/2013   Procedure: RADIAL ARTERY HARVEST;  Surgeon: Kerin Perna, MD;  Location: [facility] OR;  Service: Open Heart Surgery;  Laterality: Left;   SAVORY DILATION  12/09/2021   Procedure: SAVORY DILATION;  Surgeon: Dolores Frame, MD;  [facility];  Service: Gastroenterology;;   TONSILLECTOMY  1974   TRANSTHORACIC ECHOCARDIOGRAM  08/06/2014; 02/2018   a) Nl EF 55-60%. Gr 1 DD-high LVEDP. Mod AoV Sclerosis w/o AS. Mild AI; mild MS - mean grad 7 mmHg @ HR 122 bpm.;; b) Initial: EF 55-60%, GRII DD.  HighLAP/LVEDP.  ? Bicuspid AoV -severely Ca2+.  Mild Ao root dilation.  Severe MAC, mild MS (MVA by P1/2 T & continuity equation ~2 cm).  Mod RV dilation w/ mild- mod reduced EF.---> Limited w/Defiinity: No RWMA, Mild AS, Mod MS (~mean gradient 8 mmHg   TRANSTHORACIC ECHOCARDIOGRAM  02/2020   Normal LV size and function.  EF 65 to 70% no R WMA.  Mild LVH with basal septum.  GRII DD with elevated LVEDP.  Moderate reduced RV function with mild RV dilation, but normal PA pressures.  Mild LA dilation.  Moderate mitral stenosis, moderate AS & AI.  Mild MR, Mod MS (mean gradient 9 mmHg).  Moderate calcified AS w/ Mod AI -> Mean gradient 17.3 mmHg with peak 32 mm.   TRANSTHORACIC ECHOCARDIOGRAM  03/2019    EF 60-65%, mild concentric LVH. Gr 2 DD. No RWMA. Mild-Mod RV dilation & enlargement. Mild LA dilation. Mod MV Calcification . Mod MS (previously read as mild, but mean gradient is lower than and estimated valve area is higher than last check).  Mild-Mod AS (Mean Gradient 13.5 mmHg)    SOCIAL HISTORY:  Social History   Socioeconomic History   Marital status: Married    Spouse name: Not on file   Number of children: 2   Years of education: Not on file   Highest education level: Not on file  Occupational History   Not on file  Tobacco Use   Smoking status: Former    Packs/day: 1.00    Years: 15.00    Additional pack years: 0.00    Total pack years: 15.00    Types: Cigarettes    Quit date: 06/09/1982    Years since quitting: 40.6   Smokeless tobacco: Never  Vaping Use   Vaping Use: Never used  Substance and Sexual Activity   Alcohol use: No   Drug use: No   Sexual activity: Never  Other  Topics  Concern   Not on file  Social History Narrative   Married. Former smoker who quit in 1983.   No routine exercise - gained ~40lb post CABG.   Social Determinants of Health   Financial Resource Strain: Not on file  Food Insecurity: Not on file  Transportation Needs: Not on file  Physical Activity: Not on file  Stress: Not on file  Social Connections: Not on file  Intimate Partner Violence: Not on file    FAMILY HISTORY:  Family History  Problem Relation Age of Onset   Alzheimer's disease Mother    Stroke Mother    Hypertension Mother    Hyperlipidemia Mother    Diabetes Mother    Hypertension Father    Diabetes Paternal Grandmother    Diabetes Paternal Grandfather    Breast cancer Paternal Aunt    Ovarian cancer Paternal Aunt     CURRENT MEDICATIONS:  Current Outpatient Medications  Medication Sig Dispense Refill   acetaminophen (TYLENOL) 500 MG tablet Take 500 mg by mouth every 6 (six) hours as needed for mild pain.      albuterol (PROVENTIL HFA;VENTOLIN HFA) 108 (90 Base) MCG/ACT inhaler Inhale 2 puffs into the lungs every 6 (six) hours as needed for wheezing or shortness of breath.     albuterol (PROVENTIL) 2 MG tablet Take 2 mg by mouth daily.     ALPRAZolam (XANAX) 1 MG tablet Take 0.5-1 mg by mouth at bedtime.     Artificial Tear Solution (SOOTHE XP OP) Place 1 drop into both eyes daily as needed (dry eyes).     aspirin EC 81 MG tablet Take 81 mg by mouth every evening. 90 tablet 3   Cholecalciferol (VITAMIN D) 50 MCG (2000 UT) tablet Take 2,000 Units by mouth daily.     Cinnamon 500 MG capsule Take 1,000 mg by mouth 2 (two) times daily.     citalopram (CELEXA) 20 MG tablet Take 40 mg by mouth every evening.      diphenhydrAMINE (BENADRYL) 25 MG tablet Take 25 mg by mouth daily as needed for allergies.     docusate sodium (COLACE) 100 MG capsule Take 100 mg by mouth daily.     Ferrous Sulfate (IRON) 28 MG TABS Take 28 mg by mouth daily.     Garlic 1000 MG CAPS Take  1,000 mg by mouth daily.     Ginkgo Biloba 60 MG CAPS Take 60 mg by mouth daily.     levothyroxine (SYNTHROID, LEVOTHROID) 75 MCG tablet Take 75 mcg by mouth daily before breakfast.      Magnesium 500 MG TABS Take 500 mg by mouth 2 (two) times a week.     Menthol-Methyl Salicylate (MUSCLE RUB) 10-15 % CREA Apply 1 application topically as needed for muscle pain.     nitroGLYCERIN (NITROSTAT) 0.4 MG SL tablet Place 1 tablet (0.4 mg total) under the tongue every 5 (five) minutes as needed for chest pain. 25 tablet 12   Omega-3 Fatty Acids (SALMON OIL-1000 PO) Take 1,000 mg by mouth daily.     omeprazole (PRILOSEC) 40 MG capsule Take 1 capsule (40 mg total) by mouth daily. 90 capsule 3   OVER THE COUNTER MEDICATION Take 2 tablets by mouth daily. Ultra kidney complex     OVER THE COUNTER MEDICATION Take 2 tablets by mouth daily. Liver 10 complex otc supplement     OVER THE COUNTER MEDICATION Psyllium 500 mg daily.     OVER THE COUNTER MEDICATION Take 1  tablet by mouth daily. Neuro PS (phosphatidylserine)  otc supplement     OVER THE COUNTER MEDICATION Take 1 tablet by mouth daily. Advanced memory complex     OVER THE COUNTER MEDICATION Take 1 tablet by mouth daily as needed (constipation). Herbal laxative otc supplement     PSYLLIUM PO Take 500 mg by mouth daily.     rOPINIRole (REQUIP) 4 MG tablet Take 4 mg by mouth in the morning and at bedtime.     simvastatin (ZOCOR) 80 MG tablet Take 80 mg by mouth every evening.      TURMERIC PO Take 454 mg by mouth daily.     vitamin B-12 (CYANOCOBALAMIN) 500 MCG tablet Take 500 mcg by mouth daily.     No current facility-administered medications for this visit.    ALLERGIES:  No Known Allergies  LABORATORY DATA:  I have reviewed the labs as listed.     Latest Ref Rng & Units 01/30/2023    2:15 PM 01/11/2022   11:02 AM 01/11/2021    9:41 AM  CBC  WBC 4.0 - 10.5 K/uL 7.3  8.1  6.2   Hemoglobin 13.0 - 17.0 g/dL 16.1  09.6  04.5   Hematocrit 39.0 -  52.0 % 42.9  40.7  42.1   Platelets 150 - 400 K/uL 226  191  190       Latest Ref Rng & Units 01/30/2023    2:15 PM 01/11/2022   11:02 AM 01/11/2021    9:41 AM  CMP  Glucose 70 - 99 mg/dL 409  811  914   BUN 8 - 23 mg/dL 17  17  19    Creatinine 0.61 - 1.24 mg/dL 7.82  9.56  2.13   Sodium 135 - 145 mmol/L 138  137  138   Potassium 3.5 - 5.1 mmol/L 4.3  4.1  4.5   Chloride 98 - 111 mmol/L 103  104  104   CO2 22 - 32 mmol/L 28  25  26    Calcium 8.9 - 10.3 mg/dL 9.4  9.1  8.8   Total Protein 6.5 - 8.1 g/dL 6.6  6.7  6.8   Total Bilirubin 0.3 - 1.2 mg/dL 1.1  0.8  0.9   Alkaline Phos 38 - 126 U/L 63  62  68   AST 15 - 41 U/L 32  27  34   ALT 0 - 44 U/L 38  25  42     DIAGNOSTIC IMAGING:  I have independently reviewed the scans and discussed with the patient. MM DIAG BREAST TOMO UNI LEFT  Result Date: 01/30/2023 CLINICAL DATA:  72 year old male with history of right breast cancer post mastectomy 2012 as well as history of remote chest radiation for lymphoma. Patient presents for annual examination. EXAM: DIGITAL DIAGNOSTIC UNILATERAL LEFT MAMMOGRAM WITH TOMOSYNTHESIS TECHNIQUE: Left digital diagnostic mammography and breast tomosynthesis was performed. COMPARISON:  Previous exam(s). ACR Breast Density Category a: The breasts are almost entirely fatty. FINDINGS: No suspicious masses or calcifications are seen in the left breast. Small to moderate amount of retroareolar gynecomastia, unchanged. There is no mammographic evidence of malignancy in the left breast. IMPRESSION: No mammographic evidence of malignancy in the left breast. RECOMMENDATION: Diagnostic mammography of the left breast in 1 year. I have discussed the findings and recommendations with the patient. If applicable, a reminder letter will be sent to the patient regarding the next appointment. BI-RADS CATEGORY  2: Benign. Electronically Signed   By: Coralyn Mark.D.  On: 01/30/2023 10:17    WRAP UP:  All questions were answered.  The patient knows to call the clinic with any problems, questions or concerns.  Medical decision making: Moderate  Time spent on visit: I spent 20 minutes counseling the patient face to face. The total time spent in the appointment was 30 minutes and more than 50% was on counseling.  Carnella Guadalajara, PA-C  02/06/23 11:57 AM

## 2023-02-06 ENCOUNTER — Inpatient Hospital Stay (HOSPITAL_BASED_OUTPATIENT_CLINIC_OR_DEPARTMENT_OTHER): Payer: Medicare Other | Admitting: Physician Assistant

## 2023-02-06 VITALS — BP 135/68 | HR 93 | Resp 20 | Wt 193.3 lb

## 2023-02-06 DIAGNOSIS — G2581 Restless legs syndrome: Secondary | ICD-10-CM

## 2023-02-06 DIAGNOSIS — R053 Chronic cough: Secondary | ICD-10-CM

## 2023-02-06 DIAGNOSIS — Z9011 Acquired absence of right breast and nipple: Secondary | ICD-10-CM

## 2023-02-06 DIAGNOSIS — G629 Polyneuropathy, unspecified: Secondary | ICD-10-CM

## 2023-02-06 DIAGNOSIS — R0609 Other forms of dyspnea: Secondary | ICD-10-CM

## 2023-02-06 DIAGNOSIS — N1831 Chronic kidney disease, stage 3a: Secondary | ICD-10-CM | POA: Diagnosis not present

## 2023-02-06 DIAGNOSIS — N182 Chronic kidney disease, stage 2 (mild): Secondary | ICD-10-CM

## 2023-02-06 DIAGNOSIS — C801 Malignant (primary) neoplasm, unspecified: Secondary | ICD-10-CM

## 2023-02-06 DIAGNOSIS — K5900 Constipation, unspecified: Secondary | ICD-10-CM | POA: Diagnosis not present

## 2023-02-06 DIAGNOSIS — Z853 Personal history of malignant neoplasm of breast: Secondary | ICD-10-CM | POA: Diagnosis not present

## 2023-02-06 DIAGNOSIS — Z8572 Personal history of non-Hodgkin lymphomas: Secondary | ICD-10-CM

## 2023-02-06 DIAGNOSIS — R634 Abnormal weight loss: Secondary | ICD-10-CM | POA: Diagnosis not present

## 2023-02-06 DIAGNOSIS — C50911 Malignant neoplasm of unspecified site of right female breast: Secondary | ICD-10-CM

## 2023-02-06 DIAGNOSIS — Z1589 Genetic susceptibility to other disease: Secondary | ICD-10-CM

## 2023-02-06 DIAGNOSIS — C50421 Malignant neoplasm of upper-outer quadrant of right male breast: Secondary | ICD-10-CM

## 2023-02-15 ENCOUNTER — Encounter (HOSPITAL_COMMUNITY)
Admission: RE | Admit: 2023-02-15 | Discharge: 2023-02-15 | Disposition: A | Discharge status: Home / Self Care | Payer: Medicare Other | Source: Ambulatory Visit | Attending: Physician Assistant | Admitting: Physician Assistant

## 2023-02-15 DIAGNOSIS — Z853 Personal history of malignant neoplasm of breast: Secondary | ICD-10-CM | POA: Diagnosis not present

## 2023-02-15 DIAGNOSIS — C801 Malignant (primary) neoplasm, unspecified: Secondary | ICD-10-CM

## 2023-02-15 DIAGNOSIS — Z8572 Personal history of non-Hodgkin lymphomas: Secondary | ICD-10-CM | POA: Diagnosis not present

## 2023-02-15 DIAGNOSIS — R634 Abnormal weight loss: Secondary | ICD-10-CM

## 2023-02-15 DIAGNOSIS — Z1589 Genetic susceptibility to other disease: Secondary | ICD-10-CM

## 2023-02-15 MED ORDER — FLUDEOXYGLUCOSE F - 18 (FDG) INJECTION
10.2300 | Freq: Once | INTRAVENOUS | Status: AC | PRN
  Administered 2023-02-15: 10.23 via INTRAVENOUS

## 2023-02-19 ENCOUNTER — Encounter: Payer: Self-pay | Admitting: Physician Assistant

## 2023-02-19 ENCOUNTER — Telehealth: Payer: Self-pay | Admitting: Physician Assistant

## 2023-02-19 DIAGNOSIS — C50921 Malignant neoplasm of unspecified site of right male breast: Secondary | ICD-10-CM

## 2023-02-19 DIAGNOSIS — E559 Vitamin D deficiency, unspecified: Secondary | ICD-10-CM

## 2023-02-19 DIAGNOSIS — Z170 Estrogen receptor positive status [ER+]: Secondary | ICD-10-CM

## 2023-02-19 DIAGNOSIS — Z1231 Encounter for screening mammogram for malignant neoplasm of breast: Secondary | ICD-10-CM

## 2023-02-19 DIAGNOSIS — C50421 Malignant neoplasm of upper-outer quadrant of right male breast: Secondary | ICD-10-CM

## 2023-02-19 DIAGNOSIS — C50911 Malignant neoplasm of unspecified site of right female breast: Secondary | ICD-10-CM

## 2023-02-19 NOTE — Telephone Encounter (Signed)
PET scan obtained 02/15/2023 due to significant unintentional weight loss over the past 6 months.  PET was negative for any primary neoplastic process, metastatic disease, or recurrent lymphoma.  Since the PET scan was negative for any occult malignancy as cause of unintentional weight loss, recommend that patient follow-up with his PCP for evaluation and treatment of other causes of weight loss.  We will see him for annual follow-up around April 2025 to include the following: >> Labs = CBC/D, CMP, vitamin D >> Mammogram (MM 3D diagnostic mammogram unilateral left) >> OFFICE visit 1 week after labs/mammogram  I attempted to call the patient to discuss the above findings on 02/19/2023 (no answer x 1, left message x 1). MyChart message sent to patient with instructions for him to call the Cancer Center to set up the plan as above.  Carnella Guadalajara, PA-C 02/19/23 5:55 PM

## 2023-02-20 ENCOUNTER — Other Ambulatory Visit: Payer: Self-pay | Admitting: Physician Assistant

## 2023-02-20 DIAGNOSIS — Z1231 Encounter for screening mammogram for malignant neoplasm of breast: Secondary | ICD-10-CM

## 2023-02-20 DIAGNOSIS — Z170 Estrogen receptor positive status [ER+]: Secondary | ICD-10-CM

## 2023-02-20 DIAGNOSIS — C50911 Malignant neoplasm of unspecified site of right female breast: Secondary | ICD-10-CM

## 2023-02-20 DIAGNOSIS — C50421 Malignant neoplasm of upper-outer quadrant of right male breast: Secondary | ICD-10-CM

## 2023-02-20 NOTE — Telephone Encounter (Signed)
Connected with patient and his wife to discuss previously noted findings.  They were also informed of incidental findings including advanced vascular calcifications, and were recommended to discuss this further with primary care and cardiology.  Carnella Guadalajara, PA-C  02/20/23 11:40 AM

## 2023-06-21 DIAGNOSIS — Z23 Encounter for immunization: Secondary | ICD-10-CM | POA: Diagnosis not present

## 2023-07-04 ENCOUNTER — Ambulatory Visit (HOSPITAL_COMMUNITY)
Admission: RE | Admit: 2023-07-04 | Discharge: 2023-07-04 | Disposition: A | Discharge status: Home / Self Care | Payer: Medicare Other | Source: Ambulatory Visit | Attending: Internal Medicine | Admitting: Internal Medicine

## 2023-07-04 DIAGNOSIS — J189 Pneumonia, unspecified organism: Secondary | ICD-10-CM | POA: Diagnosis not present

## 2023-07-04 DIAGNOSIS — E1129 Type 2 diabetes mellitus with other diabetic kidney complication: Secondary | ICD-10-CM | POA: Diagnosis not present

## 2023-07-04 DIAGNOSIS — R051 Acute cough: Secondary | ICD-10-CM | POA: Diagnosis not present

## 2023-07-04 DIAGNOSIS — C50921 Malignant neoplasm of unspecified site of right male breast: Secondary | ICD-10-CM | POA: Diagnosis not present

## 2023-07-04 DIAGNOSIS — N1831 Chronic kidney disease, stage 3a: Secondary | ICD-10-CM | POA: Diagnosis not present

## 2023-07-04 DIAGNOSIS — Z6824 Body mass index (BMI) 24.0-24.9, adult: Secondary | ICD-10-CM | POA: Diagnosis not present

## 2023-07-04 DIAGNOSIS — R0989 Other specified symptoms and signs involving the circulatory and respiratory systems: Secondary | ICD-10-CM | POA: Diagnosis not present

## 2023-07-04 DIAGNOSIS — I10 Essential (primary) hypertension: Secondary | ICD-10-CM | POA: Diagnosis not present

## 2023-07-30 DIAGNOSIS — E782 Mixed hyperlipidemia: Secondary | ICD-10-CM | POA: Diagnosis not present

## 2023-07-30 DIAGNOSIS — F419 Anxiety disorder, unspecified: Secondary | ICD-10-CM | POA: Diagnosis not present

## 2023-07-30 DIAGNOSIS — C50921 Malignant neoplasm of unspecified site of right male breast: Secondary | ICD-10-CM | POA: Diagnosis not present

## 2023-07-30 DIAGNOSIS — Z125 Encounter for screening for malignant neoplasm of prostate: Secondary | ICD-10-CM | POA: Diagnosis not present

## 2023-07-30 DIAGNOSIS — Z0001 Encounter for general adult medical examination with abnormal findings: Secondary | ICD-10-CM | POA: Diagnosis not present

## 2023-07-30 DIAGNOSIS — Z1331 Encounter for screening for depression: Secondary | ICD-10-CM | POA: Diagnosis not present

## 2023-07-30 DIAGNOSIS — I10 Essential (primary) hypertension: Secondary | ICD-10-CM | POA: Diagnosis not present

## 2023-07-30 DIAGNOSIS — E559 Vitamin D deficiency, unspecified: Secondary | ICD-10-CM | POA: Diagnosis not present

## 2023-07-30 DIAGNOSIS — D518 Other vitamin B12 deficiency anemias: Secondary | ICD-10-CM | POA: Diagnosis not present

## 2023-07-30 DIAGNOSIS — F329 Major depressive disorder, single episode, unspecified: Secondary | ICD-10-CM | POA: Diagnosis not present

## 2023-07-30 DIAGNOSIS — G9332 Myalgic encephalomyelitis/chronic fatigue syndrome: Secondary | ICD-10-CM | POA: Diagnosis not present

## 2023-07-30 DIAGNOSIS — Z6824 Body mass index (BMI) 24.0-24.9, adult: Secondary | ICD-10-CM | POA: Diagnosis not present

## 2023-07-30 DIAGNOSIS — N1831 Chronic kidney disease, stage 3a: Secondary | ICD-10-CM | POA: Diagnosis not present

## 2023-07-30 DIAGNOSIS — E1129 Type 2 diabetes mellitus with other diabetic kidney complication: Secondary | ICD-10-CM | POA: Diagnosis not present

## 2023-08-06 DIAGNOSIS — L255 Unspecified contact dermatitis due to plants, except food: Secondary | ICD-10-CM | POA: Diagnosis not present

## 2023-08-06 DIAGNOSIS — B078 Other viral warts: Secondary | ICD-10-CM | POA: Diagnosis not present

## 2023-08-29 DIAGNOSIS — B078 Other viral warts: Secondary | ICD-10-CM | POA: Diagnosis not present

## 2023-09-28 DIAGNOSIS — E039 Hypothyroidism, unspecified: Secondary | ICD-10-CM | POA: Diagnosis not present

## 2023-12-18 NOTE — Progress Notes (Unsigned)
 Ryan [name], male    DOB: 1951/02/08    MRN: [id number]   Brief patient profile:  24  yowm  quit smoking 1983 at dx of Hodgins Dz self-  referred to pulmonary clinic in [place]  12/19/2023 for doe.   S/p R breast ca 2012  S/p CABG  2014 > rehab 90% > [facility] >  [facility]   PFTs  03/12/15  no obst    History of Present Illness  12/19/2023  Pulmonary/ 1st [name] eval/ Ryan [name] / Ryan [name]  Chief Complaint  Patient presents with   Consult    Self referral for SOB    Dyspnea:  walking to shop is slt incline back to house maybe 100 ft stops at  Least once or twice  Cough: some am green x months  Sleep: starts in bed but then recliner x one year  x cpap  SABA use: none  02: none / does not check with  Sob once a day at rest 5 -15 min      No obvious day to day or daytime pattern/variability or assoc excess/ purulent sputum or mucus plugs or hemoptysis or cp or chest tightness, subjective wheeze or overt sinus or hb symptoms.    Also denies any obvious fluctuation of symptoms with weather or environmental changes or other aggravating or alleviating factors except as outlined above   No unusual exposure hx or h/o childhood pna/ asthma or knowledge of premature birth.  Current Allergies, Complete Past Medical History, Past Surgical History, Family History, and Social History were reviewed in [name].  ROS  The following are not active complaints unless bolded Hoarseness, sore throat, dysphagia, dental problems, itching, sneezing,  nasal congestion or discharge of excess mucus or purulent secretions, ear ache,   fever, chills, sweats, unintended wt loss or wt gain, classically pleuritic or exertional cp,  orthopnea pnd or arm/hand swelling  or leg swelling, presyncope, palpitations, abdominal pain, anorexia, nausea, vomiting, diarrhea  or change in bowel habits or change in bladder habits, change in stools or change in urine, dysuria, hematuria,   rash, arthralgias, visual complaints, headache, numbness, weakness or ataxia or problems with walking or coordination,  change in mood or  memory.            Outpatient Medications Prior to Visit  Medication Sig Dispense Refill   acetaminophen (TYLENOL) 500 MG tablet Take 500 mg by mouth every 6 (six) hours as needed for mild pain.      ALPRAZolam (XANAX) 1 MG tablet Take 0.5-1 mg by mouth at bedtime.     Artificial Tear Solution (SOOTHE XP OP) Place 1 drop into both eyes daily as needed (dry eyes).     aspirin EC 81 MG tablet Take 81 mg by mouth every evening. 90 tablet 3   citalopram (CELEXA) 20 MG tablet Take 20 mg by mouth 2 (two) times daily.     diphenhydrAMINE (BENADRYL) 25 MG tablet Take 25 mg by mouth daily as needed for allergies.     docusate sodium (COLACE) 100 MG capsule Take 100 mg by mouth daily.     Ferrous Sulfate (IRON) 28 MG TABS Take 28 mg by mouth daily.     Ginkgo Biloba 60 MG CAPS Take 60 mg by mouth daily.     levothyroxine (SYNTHROID) 75 MCG tablet Take 75 mcg by mouth at bedtime.     Magnesium 500 MG TABS Take 500 mg by mouth 2 (two) times a week.  Menthol-Methyl Salicylate (MUSCLE RUB) 10-15 % CREA Apply 1 application topically as needed for muscle pain.     Omega-3 Fatty Acids (SALMON OIL-1000 PO) Take 1,000 mg by mouth daily.     OVER THE COUNTER MEDICATION Take 2 tablets by mouth daily. Ultra kidney complex     OVER THE COUNTER MEDICATION Psyllium 500 mg daily.     OVER THE COUNTER MEDICATION Take 1 tablet by mouth daily. Neuro PS (phosphatidylserine)  otc supplement     OVER THE COUNTER MEDICATION Take 1 tablet by mouth daily. Advanced memory complex     OVER THE COUNTER MEDICATION Take 1 tablet by mouth daily as needed (constipation). Herbal laxative otc supplement     pramipexole (MIRAPEX) 0.5 MG tablet Take 0.5 mg by mouth daily.     PSYLLIUM PO Take 500 mg by mouth daily.     simvastatin (ZOCOR) 80 MG tablet Take 80 mg by mouth every evening.       TURMERIC PO Take 454 mg by mouth daily.     vitamin B-12 (CYANOCOBALAMIN) 500 MCG tablet Take 500 mcg by mouth daily.     albuterol (PROVENTIL HFA;VENTOLIN HFA) 108 (90 Base) MCG/ACT inhaler Inhale 2 puffs into the lungs every 6 (six) hours as needed for wheezing or shortness of breath. (Patient not taking: Reported on 12/19/2023)     Garlic 1000 MG CAPS Take 1,000 mg by mouth daily. (Patient not taking: Reported on 12/19/2023)     nitroGLYCERIN (NITROSTAT) 0.4 MG SL tablet Place 1 tablet (0.4 mg total) under the tongue every 5 (five) minutes as needed for chest pain. (Patient not taking: Reported on 12/19/2023) 25 tablet 12   omeprazole (PRILOSEC) 40 MG capsule Take 1 capsule (40 mg total) by mouth daily. (Patient not taking: Reported on 12/19/2023) 90 capsule 3   No facility-administered medications prior to visit.    Past Medical History:  Diagnosis Date   Adie's pupil    Anemia 07/14/2013   Borderline diabetes    CAD (coronary artery disease), native coronary artery 04/09/2013   Dist LM stenosis 70-80% (at trifurcation into LAD, CX, & 2 Ramus branches, 70% mid RCA);; b) Myoview 08/06/14: Low Risk, no Ischemia/infarction   Depression 1983   "after I was dx'd w/CA; now just comes in spells"  (12/22/2015)   Essential hypertension    Exertional shortness of breath    Hodgkin's lymphoma (HCC) 1983   Hypercholesterolemia    Hypothyroidism    Invasive ductal carcinoma of breast, stage 2 (HCC) 04/27/2011   chemo, mastectomy   Left carotid artery partial occlusion 04/27/2011   CAROTIC DOPPLER 11/2013: < 40% R ICA stenosis, distal waveforms damped - suggests probably intracranial occlusoin, < 40% LICA, normal vertebrals -- no change   Mild aortic stenosis by prior echocardiogram 02/2018   By echo June 2020: Mild-Mod AS (Mean Gradient 13.5 mmHg)   Mild mitral stenosis by prior echocardiogram 08/06/2014   Most recent echo June 2020: Estimated valve area 2.83 cm (in 2019 was roughly 2 cm).  Mean  gradient 6-8 mmHg.  Appears to be stable.   Personal history of chemotherapy    Pre-diabetes    Restless leg syndrome    Right carotid artery occlusion 04/27/2011   S/P CABG x 3 04/09/2013   LIMA-LAD, fLRAD-RI, SVG-RCA; Echo 10/29/'15:  mild Conc LVH, no WMA, Gr 1 DD, Mod AoV Sclerosis w/ Mod AI, ~Mild-Mod MS    Sleep apnea    Stroke [facility])    2007  Thyroid disease       Objective:     BP (!) 127/56   Pulse 82   Ht 6\' 3"  (1.905 m)   Wt 209 lb 6.4 oz (95 kg)   SpO2 93% Comment: room air  BMI 26.17 kg/m   SpO2: 93 % (room air)  Hoarse amb  wm NAD decreased bs esp R base / trace pitting 2/6 sem    HEENT : Oropharynx  clear  /edentulous    Nasal turbinates nl    NECK :  without  apparent JVD/ palpable Nodes/TM    LUNGS: no acc muscle use,  Nl contour chest  decreased bs esp R base   CV:  RRR  no s3 or 2/6 SEM s  increase in P2, and trace pitting both ankles  ABD:  soft and nontender   MS:  Gait nl   ext warm without deformities Or obvious joint restrictions  calf tenderness, cyanosis or clubbing    SKIN: warm and dry without lesions    NEURO:  alert, approp, nl sensorium with  no motor or cerebellar deficits apparent.       Assessment   No problem-specific Assessment & Plan notes found for this encounter.     Sandrea Hughs, MD 12/19/2023

## 2023-12-19 ENCOUNTER — Encounter: Payer: Self-pay | Admitting: Internal Medicine

## 2023-12-19 ENCOUNTER — Ambulatory Visit: Payer: Medicare Other | Admitting: Internal Medicine

## 2023-12-19 ENCOUNTER — Ambulatory Visit (HOSPITAL_COMMUNITY)
Admission: RE | Admit: 2023-12-19 | Discharge: 2023-12-19 | Disposition: A | Discharge status: Home / Self Care | Source: Ambulatory Visit | Attending: Internal Medicine | Admitting: Internal Medicine

## 2023-12-19 ENCOUNTER — Other Ambulatory Visit: Payer: Self-pay

## 2023-12-19 VITALS — BP 127/56 | HR 82 | Ht 75.0 in | Wt 209.4 lb

## 2023-12-19 DIAGNOSIS — K219 Gastro-esophageal reflux disease without esophagitis: Secondary | ICD-10-CM

## 2023-12-19 DIAGNOSIS — R0609 Other forms of dyspnea: Secondary | ICD-10-CM | POA: Diagnosis not present

## 2023-12-19 DIAGNOSIS — I6523 Occlusion and stenosis of bilateral carotid arteries: Secondary | ICD-10-CM

## 2023-12-19 DIAGNOSIS — Z87891 Personal history of nicotine dependence: Secondary | ICD-10-CM

## 2023-12-19 MED ORDER — STIOLTO RESPIMAT 2.5-2.5 MCG/ACT IN AERS: 2.0000 | INHALATION_SPRAY | Freq: Every day | RESPIRATORY_TRACT | Status: DC

## 2023-12-19 MED ORDER — AMOXICILLIN-POT CLAVULANATE 875-125 MG PO TABS: 1.0000 | ORAL_TABLET | Freq: Two times a day (BID) | ORAL | 0 refills | Status: AC

## 2023-12-19 MED ORDER — PANTOPRAZOLE SODIUM 40 MG PO TBEC: 40.0000 mg | DELAYED_RELEASE_TABLET | Freq: Every day | ORAL | 2 refills | Status: DC

## 2023-12-19 MED ORDER — FAMOTIDINE 20 MG PO TABS: ORAL_TABLET | ORAL | 11 refills | Status: DC

## 2023-12-19 NOTE — Patient Instructions (Addendum)
 Pantoprazole (protonix) 40 mg   Take  30-60 min before first meal of the day and Pepcid (famotidine)  20 mg after supper until return to office - this is the best way to tell whether stomach acid is contributing to your problem.     GERD (REFLUX)  is an extremely common cause of respiratory symptoms just like yours , many times with no obvious heartburn at all.    It can be treated with medication, but also with lifestyle changes including elevation of the head of your bed (ideally with 6 -8inch blocks under the headboard of your bed),  Smoking cessation, avoidance of late meals, excessive alcohol, and avoid fatty foods, chocolate, peppermint, colas, red wine, and acidic juices such as orange juice.  NO MINT OR MENTHOL PRODUCTS SO NO COUGH DROPS  USE SUGARLESS CANDY INSTEAD (Jolley ranchers or Stover's or Life Savers) or even ice chips will also do - the key is to swallow to prevent all throat clearing. NO OIL BASED VITAMINS - use powdered substitutes.  Avoid fish oil when coughing.   Make sure you check your oxygen saturation at your highest level of activity(NOT after you stop)  to be sure it stays over 90% and keep track of it at least once a week, more often if breathing getting worse, and let me know if losing ground. (Collect the dots to connect the dots approach)    Please remember to go to the lab department   for your tests - we will call you with the results when they are available.      Please remember to go to the  x-ray department  @  [facility] for your tests - we will call you with the results when they are available     My office will be contacting you by phone for referral to [facility] for sniff test   - if you don't hear back from my office within one week please call us back or notify us thru MyChart and we'll address it right away.    Stiolto 2 puffs each am trial basis - call for prescription if better walking to shop and back  Augmentin 875 mg take one pill twice  daily  X 10 days - take at breakfast and supper with large glass of water.  It would help reduce the usual side effects (diarrhea and yeast infections) if you ate cultured yogurt at lunch.    Please schedule a follow up office visit in 2 weeks, sooner if needed

## 2023-12-20 NOTE — Assessment & Plan Note (Signed)
 Onset around 2012 > CABG but never back to baseline  - PFTs  2016 s obstruction but reported response  to albuterol inhaler - Echo 02/18/20   1. Left ventricular ejection fraction, by estimation, is 65 to 70%. The  left ventricle has normal function. The left ventricle has no regional  wall motion abnormalities. There is mild left ventricular hypertrophy of  the basal-septal segment. LV diastolic parameters are consistent with Grade II diastolic  dysfunction (pseudonormalization). Elevated left ventricular end-diastolic  pressure.   2. Right ventricular systolic function is moderately reduced. The right  ventricular size is mildly enlarged. There is normal pulmonary artery  systolic pressure.   3. Left atrial size was mildly dilated.   4. The mitral valve is degenerative. Mild mitral valve regurgitation.  Moderate mitral stenosis.   5. The aortic valve is tricuspid. Aortic valve regurgitation is moderate.  Moderate aortic valve stenosis   No evidence of ILD or airways dz based on today's exam  - the occurrence of spont sob at rest than resolves w/in 15 min s rx is more suggestive of anxiety or LPR/VCD  than a cardiopulmonary problem but he does clearly have valvular heart dz with elevated LA and diastolic pressures at rest so rec   1) max rx for gerd/ LPR  2) continue f/u by cards  3) Sniff test to be complete though see no evidence of HD dysfunction the exp to mantle radiation is a risk for phrenic nerve injury   4) trial of stiolto since he's convinced saba helps and use saba to supplement stiolto   5) Make sure you check your oxygen saturation at your highest level of activity(NOT after you stop)  to be sure it stays over 90% and keep track of it at least once a week, more often if breathing getting worse, and let me know if losing ground. (Collect the dots to connect the dots approach)   6) doe labs pending           Each maintenance medication was reviewed in detail including  emphasizing most importantly the difference between maintenance and prns and under what circumstances the prns are to be triggered using an action plan format where appropriate.  Total time for H and P, chart review, counseling, reviewing smi device(s) and generating customized AVS unique to this office visit / same day charting = 61 min new pt eval with multiple  refractory respiratory  symptoms of uncertain etiology

## 2023-12-24 LAB — CBC WITH DIFFERENTIAL/PLATELET
Basophils Absolute: 0.1 10*3/uL (ref 0.0–0.2)
Basos: 1 %
EOS (ABSOLUTE): 0.3 10*3/uL (ref 0.0–0.4)
Eos: 4 %
Hematocrit: 39.3 % (ref 37.5–51.0)
Hemoglobin: 12.6 g/dL — ABNORMAL LOW (ref 13.0–17.7)
Immature Grans (Abs): 0 10*3/uL (ref 0.0–0.1)
Immature Granulocytes: 0 %
Lymphocytes Absolute: 1.3 10*3/uL (ref 0.7–3.1)
Lymphs: 18 %
MCH: 29 pg (ref 26.6–33.0)
MCHC: 32.1 g/dL (ref 31.5–35.7)
MCV: 90 fL (ref 79–97)
Monocytes Absolute: 0.9 10*3/uL (ref 0.1–0.9)
Monocytes: 13 %
Neutrophils Absolute: 4.5 10*3/uL (ref 1.4–7.0)
Neutrophils: 64 %
Platelets: 218 10*3/uL (ref 150–450)
RBC: 4.35 x10E6/uL (ref 4.14–5.80)
RDW: 12.9 % (ref 11.6–15.4)
WBC: 6.9 10*3/uL (ref 3.4–10.8)

## 2023-12-24 LAB — BASIC METABOLIC PANEL
BUN/Creatinine Ratio: 12 (ref 10–24)
BUN: 17 mg/dL (ref 8–27)
CO2: 24 mmol/L (ref 20–29)
Calcium: 9.5 mg/dL (ref 8.6–10.2)
Chloride: 105 mmol/L (ref 96–106)
Creatinine, Ser: 1.39 mg/dL — ABNORMAL HIGH (ref 0.76–1.27)
Glucose: 68 mg/dL — ABNORMAL LOW (ref 70–99)
Potassium: 4.8 mmol/L (ref 3.5–5.2)
Sodium: 141 mmol/L (ref 134–144)
eGFR: 53 mL/min/{1.73_m2} — ABNORMAL LOW (ref >59)

## 2023-12-24 LAB — SEDIMENTATION RATE: Sed Rate: 2 mm/h (ref 0–30)

## 2023-12-24 LAB — D-DIMER, QUANTITATIVE: D-DIMER: 0.45 mg{FEU}/L (ref 0.00–0.49)

## 2023-12-24 LAB — BRAIN NATRIURETIC PEPTIDE: BNP: 193.1 pg/mL — ABNORMAL HIGH (ref 0.0–100.0)

## 2023-12-24 LAB — IGE: IgE (Immunoglobulin E), Serum: 184 [IU]/mL (ref 6–495)

## 2023-12-24 LAB — TSH: TSH: 5.32 u[IU]/mL — ABNORMAL HIGH (ref 0.450–4.500)

## 2023-12-28 ENCOUNTER — Ambulatory Visit (INDEPENDENT_AMBULATORY_CARE_PROVIDER_SITE_OTHER): Payer: Medicare Other | Admitting: Physician Assistant

## 2023-12-28 ENCOUNTER — Ambulatory Visit: Payer: Self-pay | Admitting: Internal Medicine

## 2023-12-28 ENCOUNTER — Ambulatory Visit (HOSPITAL_COMMUNITY)
Admission: RE | Admit: 2023-12-28 | Discharge: 2023-12-28 | Disposition: A | Discharge status: Home / Self Care | Payer: Medicare Other | Source: Ambulatory Visit | Attending: Vascular Surgery | Admitting: Vascular Surgery

## 2023-12-28 ENCOUNTER — Telehealth: Payer: Self-pay | Admitting: Internal Medicine

## 2023-12-28 ENCOUNTER — Encounter: Payer: Self-pay | Admitting: Physician Assistant

## 2023-12-28 VITALS — BP 166/70 | HR 82 | Temp 98.6°F | Ht 75.0 in | Wt 210.4 lb

## 2023-12-28 DIAGNOSIS — I6523 Occlusion and stenosis of bilateral carotid arteries: Secondary | ICD-10-CM | POA: Diagnosis not present

## 2023-12-28 NOTE — Telephone Encounter (Signed)
 PT's wife calling. He was told by Dr. Sherene Sires if he had not been sched for a "Sniff test" in a week to call the office.  AVS Reads: My office will be contacting you by phone for referral to [facility] for sniff test    Please call wife (DPR) @ [phone]

## 2023-12-28 NOTE — Telephone Encounter (Signed)
 Chief Complaint: productive cough Symptoms: SOB with cough Frequency: 1.5 weeks Pertinent Negatives: Patient denies fever, URI sx, CP Disposition: [] ED /[] Urgent Care (no appt availability in office) / [x] Appointment(In office/virtual)/ []  [facility] [place] [facility]/ [] Home Care/ [] Refused Recommended Disposition /[] [place] [facility]/ [x]  Follow-up with PCP Additional Notes: Pt wife calling, c/o productive cough x 1.5 weeks. Reports recent visit with Dr. Sherene Sires and was prescribed amoxicillin -- will finish course tonight, but coughing is persistent with colored mucus. Of note, pt has appt scheduled next week, and wife is concerned with needing different abx in the interim. Unable to schedule d/t access. [facility] will forward encounter for Dr. Sherene Sires to review and advise. Caregiver verbalized understanding and is expecting call back from office for next steps.    Reason for Disposition  Coughing up rusty-colored (reddish-brown) sputum  Answer Assessment - Initial Assessment Questions E2C2 Pulmonary Triage - Initial Assessment Questions "Chief Complaint (e.g., cough, sob, wheezing, fever, chills, sweat or additional symptoms) *Go to specific symptom protocol after initial questions. Congestion - recent amoxicillin completed tonight coughing  "How long have symptoms been present?" 1.5 weeks  Have you tested for COVID or Flu? Note: If not, ask patient if a home test can be taken. If so, instruct patient to call back for positive results. Yes, COVID neg  MEDICINES:   "Have you used any OTC meds to help with symptoms?" No If yes, ask "What medications?" [name]  "Have you used your inhalers/maintenance medication?" Yes If yes, "What medications?" Stiolto - 2 puffs daily  If inhaler, ask "How many puffs and how often?" Note: Review instructions on medication in the chart. See above  OXYGEN: "Do you wear supplemental oxygen?" No If yes, "How many liters are you supposed to use?" [name]  "Do  you monitor your oxygen levels?" Yes If yes, "What is your reading (oxygen level) today?" Only checks with exertion  "What is your usual oxygen saturation reading?"  (Note: Pulmonary O2 sats should be 90% or greater) < 90% with exertion   3. SPUTUM: "Describe the color of your sputum" (none, dry cough; clear, white, yellow, green)     Yellow green, brown 4. HEMOPTYSIS: "Are you coughing up any blood?" If so ask: "How much?" (flecks, streaks, tablespoons, etc.)     no 5. DIFFICULTY BREATHING: "Are you having difficulty breathing?" If Yes, ask: "How bad is it?" (e.g., mild, moderate, severe)    - MILD: No SOB at rest, mild SOB with walking, speaks normally in sentences, can lie down, no retractions, pulse < 100.    - MODERATE: SOB at rest, SOB with minimal exertion and prefers to sit, cannot lie down flat, speaks in phrases, mild retractions, audible wheezing, pulse 100-120.    - SEVERE: Very SOB at rest, speaks in single words, struggling to breathe, sitting hunched forward, retractions, pulse > 120      Mild SOB with coughing 6. FEVER: "Do you have a fever?" If Yes, ask: "What is your temperature, how was it measured, and when did it start?"     afebrile 7. CARDIAC HISTORY: "Do you have any history of heart disease?" (e.g., heart attack, congestive heart failure)      no 8. LUNG HISTORY: "Do you have any history of lung disease?"  (e.g., pulmonary embolus, asthma, emphysema)     SOB 9. PE RISK FACTORS: "Do you have a history of blood clots?" (or: recent major surgery, recent prolonged travel, bedridden)     no 10. OTHER SYMPTOMS: "Do you  have any other symptoms?" (e.g., runny nose, wheezing, chest pain)       denies  Protocols used: Cough - Acute Productive-A-AH

## 2023-12-29 NOTE — Progress Notes (Signed)
 Office Note   History of Present Illness   Ryan [name] is a 73 y.o. (12-28-1950) male who presents for surveillance of carotid artery stenosis.  He has a known right internal carotid artery occlusion.  He also has a history of right hemispheric CVA in 2007 that caused left-sided weakness.  He also has a history of head/neck radiation, which was likely the cause of his right ICA occlusion.  He returns today for follow-up.  He says that he has been doing okay since his last follow-up.  He has been dealing with lung issues over the past several months.  Several months ago he started dealing with shortness of breath upon exertion.  Over the past couple weeks he is now intermittently dealing with shortness of breath at rest.  He is pending workup with a pulmonologist.  He also endorses a productive cough for the past 10 days without fever or chills.  His pulmonologist placed him on antibiotics.  He denies any strokelike symptoms such as slurred speech, facial droop, sudden weakness/numbness, or sudden vision changes.  Current Outpatient Medications  Medication Sig Dispense Refill   acetaminophen (TYLENOL) 500 MG tablet Take 500 mg by mouth every 6 (six) hours as needed for mild pain.      ALPRAZolam (XANAX) 1 MG tablet Take 0.5-1 mg by mouth at bedtime.     amoxicillin-clavulanate (AUGMENTIN) 875-125 MG tablet Take 1 tablet by mouth 2 (two) times daily for 10 days. 20 tablet 0   Artificial Tear Solution (SOOTHE XP OP) Place 1 drop into both eyes daily as needed (dry eyes).     aspirin EC 81 MG tablet Take 81 mg by mouth every evening. 90 tablet 3   citalopram (CELEXA) 20 MG tablet Take 20 mg by mouth 2 (two) times daily.     diphenhydrAMINE (BENADRYL) 25 MG tablet Take 25 mg by mouth daily as needed for allergies.     docusate sodium (COLACE) 100 MG capsule Take 100 mg by mouth daily.     famotidine (PEPCID) 20 MG tablet One after supper 30 tablet 11   Ferrous Sulfate (IRON) 28 MG TABS Take  28 mg by mouth daily.     Ginkgo Biloba 60 MG CAPS Take 60 mg by mouth daily.     levothyroxine (SYNTHROID) 75 MCG tablet Take 75 mcg by mouth at bedtime.     Magnesium 500 MG TABS Take 500 mg by mouth 2 (two) times a week.     Menthol-Methyl Salicylate (MUSCLE RUB) 10-15 % CREA Apply 1 application topically as needed for muscle pain.     nitroGLYCERIN (NITROSTAT) 0.4 MG SL tablet Place 1 tablet (0.4 mg total) under the tongue every 5 (five) minutes as needed for chest pain. 25 tablet 12   Omega-3 Fatty Acids (SALMON OIL-1000 PO) Take 1,000 mg by mouth daily.     OVER THE COUNTER MEDICATION Take 2 tablets by mouth daily. Ultra kidney complex     OVER THE COUNTER MEDICATION Psyllium 500 mg daily.     OVER THE COUNTER MEDICATION Take 1 tablet by mouth daily. Neuro PS (phosphatidylserine)  otc supplement     OVER THE COUNTER MEDICATION Take 1 tablet by mouth daily. Advanced memory complex     OVER THE COUNTER MEDICATION Take 1 tablet by mouth daily as needed (constipation). Herbal laxative otc supplement     pantoprazole (PROTONIX) 40 MG tablet Take 1 tablet (40 mg total) by mouth daily. Take 30-60 min before first meal  of the day 30 tablet 2   pramipexole (MIRAPEX) 0.5 MG tablet Take 0.5 mg by mouth daily.     PSYLLIUM PO Take 500 mg by mouth daily.     simvastatin (ZOCOR) 80 MG tablet Take 80 mg by mouth every evening.      Tiotropium Bromide-Olodaterol (STIOLTO RESPIMAT) 2.5-2.5 MCG/ACT AERS Inhale 2 puffs into the lungs daily. 1 each    TURMERIC PO Take 454 mg by mouth daily.     vitamin B-12 (CYANOCOBALAMIN) 500 MCG tablet Take 500 mcg by mouth daily.     albuterol (PROVENTIL HFA;VENTOLIN HFA) 108 (90 Base) MCG/ACT inhaler Inhale 2 puffs into the lungs every 6 (six) hours as needed for wheezing or shortness of breath. (Patient not taking: Reported on 12/19/2023)     Garlic 1000 MG CAPS Take 1,000 mg by mouth daily. (Patient not taking: Reported on 12/19/2023)     omeprazole (PRILOSEC) 40 MG  capsule Take 1 capsule (40 mg total) by mouth daily. (Patient not taking: Reported on 12/28/2023) 90 capsule 3   No current facility-administered medications for this visit.    REVIEW OF SYSTEMS (negative unless checked):   Cardiac:  []  Chest pain or chest pressure? [x]  Shortness of breath upon activity? [x]  Shortness of breath when lying flat? []  Irregular heart rhythm?  Vascular:  []  Pain in calf, thigh, or hip brought on by walking? []  Pain in feet at night that wakes you up from your sleep? []  Blood clot in your veins? []  Leg swelling?  Pulmonary:  []  Oxygen at home? [x]  Productive cough? []  Wheezing?  Neurologic:  []  Sudden weakness in arms or legs? []  Sudden numbness in arms or legs? []  Sudden onset of difficult speaking or slurred speech? []  Temporary loss of vision in one eye? []  Problems with dizziness?  Gastrointestinal:  []  Blood in stool? []  Vomited blood?  Genitourinary:  []  Burning when urinating? []  Blood in urine?  Psychiatric:  []  Major depression  Hematologic:  []  Bleeding problems? []  Problems with blood clotting?  Dermatologic:  []  Rashes or ulcers?  Constitutional:  []  Fever or chills?  Ear/Nose/Throat:  []  Change in hearing? []  Nose bleeds? []  Sore throat?  Musculoskeletal:  []  Back pain? []  Joint pain? []  Muscle pain?   Physical Examination   Vitals:   12/28/23 1449 12/28/23 1451  BP: (!) 184/80 (!) 166/70  Pulse: 82   Temp: 98.6 F (37 C)   TempSrc: Temporal   SpO2: 95%   Weight: 210 lb 6.4 oz (95.4 kg)   Height: 6\' 3"  (1.905 m)    Body mass index is 26.3 kg/m.  General:  WDWN in NAD; vital signs documented above Gait: Not observed HENT: WNL, normocephalic Pulmonary: normal non-labored breathing Cardiac: Regular Abdomen: soft, NT, no masses Skin: without rashes Vascular Exam/Pulses: palpable radial pulses bilaterally Extremities: without ischemic changes, without gangrene , without cellulitis; without open  wounds;  Musculoskeletal: no muscle wasting or atrophy  Neurologic: A&O X 3;  No focal weakness or paresthesias are detected Psychiatric:  The pt has Normal affect.  Non-Invasive Vascular Imaging   Bilateral Carotid Duplex (12/28/2023):  R ICA stenosis:  Occluded R VA:  patent and antegrade L ICA stenosis:  1-39% L VA:  patent and antegrade   Medical Decision Making   Ryan [name] is a 74 y.o. male who presents for surveillance of carotid artery stenosis  Based on the patient's vascular studies, his carotid artery stenosis is unchanged bilaterally.  He has a  known right ICA occlusion.  He has 1 to 39% stenosis of his left ICA He denies any strokelike symptoms such as slurred speech, facial droop, sudden visual changes, or sudden weakness/numbness.  He has no carotid bruit on exam.  He has palpable and equal radial pulses bilaterally He can follow-up with our office in 1 year with repeat carotid duplex   Loel Dubonnet PA-C [facility] Office: [phone]  Call MD: Randie Heinz

## 2023-12-30 NOTE — Progress Notes (Unsigned)
 Ryan [name], male    DOB: 1951/02/27    MRN: [id number]   Brief patient profile:  71  yowm  quit smoking 1983 at dx of Hodgins Dz self-  referred to pulmonary clinic in [place]  12/19/2023 for doe.   S/p R breast ca 2012  S/p CABG  2014 > rehab 90% > [facility] >  [facility]   PFTs  03/12/15  no obst    History of Present Illness  12/19/2023  Pulmonary/ 1st office eval/ Ryan [name] / [place] [facility]  Chief Complaint  Patient presents with   Consult    Self referral for SOB    Dyspnea:  walking to shop is slt incline back to house maybe 100 ft stops at  least once or twice  Cough: some am green x months  Sleep: starts in bed but then recliner x one year  x cpap  SABA use: none  02: none / does not check with  Sob once a day at rest 5 -15 min   Rec     01/03/2024  f/u ov/[facility]/Ryan [name] re: *** maint on ***  No chief complaint on file.   Dyspnea:  *** Cough: *** Sleeping: ***   resp cc  SABA use: *** 02: ***  Lung cancer screening: ***   No obvious day to day or daytime variability or assoc excess/ purulent sputum or mucus plugs or hemoptysis or cp or chest tightness, subjective wheeze or overt sinus or hb symptoms.    Also denies any obvious fluctuation of symptoms with weather or environmental changes or other aggravating or alleviating factors except as outlined above   No unusual exposure hx or h/o childhood pna/ asthma or knowledge of premature birth.  Current Allergies, Complete Past Medical History, Past Surgical History, Family History, and Social History were reviewed in Ryan [name] record.  ROS  The following are not active complaints unless bolded Hoarseness, sore throat, dysphagia, dental problems, itching, sneezing,  nasal congestion or discharge of excess mucus or purulent secretions, ear ache,   fever, chills, sweats, unintended wt loss or wt gain, classically pleuritic or exertional cp,  orthopnea pnd or arm/hand swelling  or leg  swelling, presyncope, palpitations, abdominal pain, anorexia, nausea, vomiting, diarrhea  or change in bowel habits or change in bladder habits, change in stools or change in urine, dysuria, hematuria,  rash, arthralgias, visual complaints, headache, numbness, weakness or ataxia or problems with walking or coordination,  change in mood or  memory.        No outpatient medications have been marked as taking for the 01/03/24 encounter (Appointment) with Ryan Cowden, MD.           Past Medical History:  Diagnosis Date   Adie's pupil    Anemia 07/14/2013   Borderline diabetes    CAD (coronary artery disease), native coronary artery 04/09/2013   Dist LM stenosis 70-80% (at trifurcation into LAD, CX, & 2 Ramus branches, 70% mid RCA);; b) Myoview 08/06/14: Low Risk, no Ischemia/infarction   Depression 1983   "after I was dx'd w/CA; now just comes in spells"  (12/22/2015)   Essential hypertension    Exertional shortness of breath    Hodgkin's lymphoma (HCC) 1983   Hypercholesterolemia    Hypothyroidism    Invasive ductal carcinoma of breast, stage 2 (HCC) 04/27/2011   chemo, mastectomy   Left carotid artery partial occlusion 04/27/2011   CAROTIC DOPPLER 11/2013: < 40% R ICA stenosis, distal waveforms damped -  suggests probably intracranial occlusoin, < 40% LICA, normal vertebrals -- no change   Mild aortic stenosis by prior echocardiogram 02/2018   By echo June 2020: Mild-Mod AS (Mean Gradient 13.5 mmHg)   Mild mitral stenosis by prior echocardiogram 08/06/2014   Most recent echo June 2020: Estimated valve area 2.83 cm (in 2019 was roughly 2 cm).  Mean gradient 6-8 mmHg.  Appears to be stable.   Personal history of chemotherapy    Pre-diabetes    Restless leg syndrome    Right carotid artery occlusion 04/27/2011   S/P CABG x 3 04/09/2013   LIMA-LAD, fLRAD-RI, SVG-RCA; Echo 10/29/'15:  mild Conc LVH, no WMA, Gr 1 DD, Mod AoV Sclerosis w/ Mod AI, ~Mild-Mod MS    Sleep apnea    Stroke  [facility])    2007   Thyroid disease       Objective:    Wts   01/03/2024       ***   12/28/23 210 lb 6.4 oz (95.4 kg)  12/19/23 209 lb 6.4 oz (95 kg)  02/06/23 193 lb 5.5 oz (87.7 kg)      Vital signs reviewed  01/03/2024  - Note at rest 02 sats  ***% on ***   General appearance:    ***  edentulous   slightly decreased bs  2/6 SEM s  increase in P2***   CXR PA and Lateral:   12/19/2023 :    I personally reviewed images and impression is as follows:     No acute findings      Assessment

## 2023-12-31 ENCOUNTER — Telehealth: Payer: Self-pay | Admitting: Internal Medicine

## 2023-12-31 ENCOUNTER — Telehealth: Payer: Self-pay

## 2023-12-31 NOTE — Telephone Encounter (Signed)
 Spoke with patient's wife (DPR) and she states patient's cough has improved and they do not want another atbx at this time. Nothing further needed at this time.

## 2023-12-31 NOTE — Telephone Encounter (Signed)
 Patient is scheduled for a "sniff test'"on 01/02/24----does he use his inhaler prior to this testing----call back [phone]

## 2023-12-31 NOTE — Telephone Encounter (Signed)
 Triage/Advice:  -spouse LM on triage line stating when they got home from their appt, her husband went to the shop with his son-in-law and he could not move his L arm then L hand for about an hour but by the time he got back to the house the symptoms resolved. -returned call speaking to both pt and wife that those symptoms are consistent with stroke-like symptoms.  Pt states yes they were just like his stroke he had several years ago, but they went away so he thought it was called a "TIA".  Pt and wife educated to call 911/report to ED immediately for any stroke-like symptoms  -both confirm understanding

## 2023-12-31 NOTE — Telephone Encounter (Signed)
 Spoke with wife (DPR) and advised patient should not need inhaler prior to sniff test, can take with him if thinks he may need. Nothing further needed at this time.

## 2024-01-01 NOTE — Telephone Encounter (Signed)
 LM for PT to call to  verify appt made for him was a good day and time for him.

## 2024-01-02 ENCOUNTER — Ambulatory Visit (HOSPITAL_COMMUNITY)
Admission: RE | Admit: 2024-01-02 | Discharge: 2024-01-02 | Disposition: A | Discharge status: Home / Self Care | Source: Ambulatory Visit | Attending: Internal Medicine | Admitting: Internal Medicine

## 2024-01-02 DIAGNOSIS — R0609 Other forms of dyspnea: Secondary | ICD-10-CM | POA: Insufficient documentation

## 2024-01-02 DIAGNOSIS — R9389 Abnormal findings on diagnostic imaging of other specified body structures: Secondary | ICD-10-CM | POA: Diagnosis not present

## 2024-01-02 DIAGNOSIS — R0600 Dyspnea, unspecified: Secondary | ICD-10-CM | POA: Diagnosis not present

## 2024-01-02 NOTE — Telephone Encounter (Signed)
 Had test. NFN

## 2024-01-02 NOTE — Telephone Encounter (Signed)
 Sent [facility] message asking PT to call us.

## 2024-01-03 ENCOUNTER — Encounter: Payer: Self-pay | Admitting: Internal Medicine

## 2024-01-03 ENCOUNTER — Ambulatory Visit: Admitting: Internal Medicine

## 2024-01-03 VITALS — BP 135/61 | HR 92 | Ht 75.0 in | Wt 210.8 lb

## 2024-01-03 DIAGNOSIS — R0609 Other forms of dyspnea: Secondary | ICD-10-CM

## 2024-01-03 DIAGNOSIS — G5680 Other specified mononeuropathies of unspecified upper limb: Secondary | ICD-10-CM | POA: Insufficient documentation

## 2024-01-03 MED ORDER — STIOLTO RESPIMAT 2.5-2.5 MCG/ACT IN AERS: 2.0000 | INHALATION_SPRAY | Freq: Every day | RESPIRATORY_TRACT | 11 refills | Status: DC

## 2024-01-03 NOTE — Assessment & Plan Note (Signed)
 Onset around 2012 > CABG but never back to baseline  - PFTs  2016 s obstruction but reported response  to albuterol inhaler - Echo 02/18/20   1. Left ventricular ejection fraction, by estimation, is 65 to 70%. The  left ventricle has normal function. The left ventricle has no regional  wall motion abnormalities. There is mild left ventricular hypertrophy of  the basal-septal segment. LV diastolic parameters are consistent with Grade II diastolic  dysfunction (pseudonormalization). Elevated left ventricular end-diastolic  pressure.   2. Right ventricular systolic function is moderately reduced. The right  ventricular size is mildly enlarged. There is normal pulmonary artery  systolic pressure.   3. Left atrial size was mildly dilated.   4. The mitral valve is degenerative. Mild mitral valve regurgitation.  Moderate mitral stenosis.   5. The aortic valve is tricuspid. Aortic valve regurgitation is moderate.  Moderate aortic valve stenosis  - Allergy screen 12/19/23  >  Eos 0.3/  IgE  184 - Sniff 01/02/2024   R phrenic nerve palsy  - 01/03/2024  After extensive coaching inhaler device,  effectiveness =    80% DPI  > try off stiolto and compare to taking it  - 01/03/2024   Walked on RA   x  1   lap(s) =  approx 150  ft  @ slow pace, stopped due to felt light headed  with lowest 02 sats 94% s sob    I really doubt he's developed much copd since prior PFTs and looks more limited by cardiac than pumonary function so ok to try off stiolto for a week to see what difference if any it makes in doe / need for saba and if none then can just use prn saba  Re SABA :  I spent extra time with pt today reviewing appropriate use of albuterol for prn use on exertion with the following points: 1) saba is for relief of sob that does not improve by walking a slower pace or resting but rather if the pt does not improve after trying this first. 2) If the pt is convinced, as many are, that saba helps recover from activity  faster then it's easy to tell if this is the case by re-challenging : ie stop, take the inhaler, then p 5 minutes try the exact same activity (intensity of workload) that just caused the symptoms and see if they are substantially diminished or not after saba 3) if there is an activity that reproducibly causes the symptoms, try the saba 15 min before the activity on alternate days   If in fact the saba really does help, then fine to continue to use it prn but advised may need to look closer at the maintenance regimen being used to achieve better control of airways disease with exertion.

## 2024-01-03 NOTE — Patient Instructions (Addendum)
 See your cardiologist - no immediate concern but you need follow up   My office will be contacting you by phone for referral for  PFTs  - if you don't hear back from my office within one week please call us back or notify us thru MyChart and we'll address it right away.   Ok to try off acid suppression after a full month to see what if any symptoms recur but cough / short of breath recur  and if so resume it.   Ok to try off stiolto but if worse breathing restart   Please schedule a follow up visit in 3 months but call sooner if needed

## 2024-01-03 NOTE — Assessment & Plan Note (Addendum)
 See sniff 01/02/24   Reviewed impact of the R HD paralysis  - main issue is sleep related but he declined referral to sleep medicine for now   >>> rec keep wt down/ use recliner and f/u sleep medicine prn (was Dr Juanetta Gosling, retired)    Each maintenance medication was reviewed in detail including emphasizing most importantly the difference between maintenance and prns and under what circumstances the prns are to be triggered using an action plan format where appropriate.  Total time for H and P, chart review, counseling, reviewing smi/hfa device(s) , directly observing portions of ambulatory 02 saturation study/ and generating customized AVS unique to this office visit / same day charting = summary f/u ov

## 2024-01-04 ENCOUNTER — Telehealth: Payer: Self-pay | Admitting: Internal Medicine

## 2024-01-04 NOTE — Telephone Encounter (Signed)
 Spoke with patient regarding the Thursday 02/07/24 at 10:00 am at [facility] time is 9:45 am---1st floor registration desk for check in---will mail appointment information and isntructions to patient and he voiced his understanding

## 2024-01-14 DIAGNOSIS — H43393 Other vitreous opacities, bilateral: Secondary | ICD-10-CM | POA: Diagnosis not present

## 2024-01-24 ENCOUNTER — Telehealth: Payer: Self-pay

## 2024-01-24 DIAGNOSIS — R0609 Other forms of dyspnea: Secondary | ICD-10-CM

## 2024-01-24 NOTE — Telephone Encounter (Signed)
 Copied from CRM [id number]. Topic: General - Other >> Jan 24, 2024 10:54 AM Evie Hoff wrote: Reason for CRM: patient wife is calking to leave a message for Dr wert.patient is requesting a call back concerning the patient  [phone]

## 2024-01-24 NOTE — Telephone Encounter (Signed)
 Spoke with patients wife with permission from recent DPR-  Patient has not met his deductible yet and the Stiolto is too expensive at this time. Patients wife states they can get the albuterol inhaler though  Patients wife understands Dr. Waymond Hailey is OOO and will likely not respond until Monday. Offered to send this message to another provider but declined. Please advise on course of treatment. Thank you.

## 2024-01-24 NOTE — Telephone Encounter (Signed)
 Duoneb qid per neb prn is cheapest alternative

## 2024-01-29 MED ORDER — IPRATROPIUM-ALBUTEROL 0.5-2.5 (3) MG/3ML IN SOLN: 3.0000 mL | Freq: Four times a day (QID) | RESPIRATORY_TRACT | 2 refills | Status: DC

## 2024-01-29 MED ORDER — VENTOLIN HFA 108 (90 BASE) MCG/ACT IN AERS: 2.0000 | INHALATION_SPRAY | RESPIRATORY_TRACT | 2 refills | Status: AC | PRN

## 2024-01-29 NOTE — Telephone Encounter (Signed)
 Per verbal from Dr. Waymond Hailey, ok to send Duoneb and Albuterol  inhaler

## 2024-01-31 ENCOUNTER — Telehealth: Payer: Self-pay

## 2024-01-31 NOTE — Telephone Encounter (Signed)
 Received an additional information fax from [facility] pharmacy. I informed patient I was sending both scripts and nebulizer to [facility].  Called patient to inquire if they picked up their scripts yet-LVM to return call

## 2024-02-05 ENCOUNTER — Ambulatory Visit (HOSPITAL_COMMUNITY)
Admission: RE | Admit: 2024-02-05 | Discharge: 2024-02-05 | Disposition: A | Discharge status: Home / Self Care | Payer: Medicare Other | Source: Ambulatory Visit | Attending: Physician Assistant

## 2024-02-05 ENCOUNTER — Ambulatory Visit (HOSPITAL_COMMUNITY)
Admission: RE | Admit: 2024-02-05 | Discharge: 2024-02-05 | Disposition: A | Discharge status: Home / Self Care | Payer: Medicare Other | Source: Ambulatory Visit | Attending: Physician Assistant | Admitting: Physician Assistant

## 2024-02-05 ENCOUNTER — Inpatient Hospital Stay: Payer: Medicare Other | Attending: Hematology

## 2024-02-05 ENCOUNTER — Encounter (HOSPITAL_COMMUNITY): Payer: Self-pay

## 2024-02-05 DIAGNOSIS — Z1231 Encounter for screening mammogram for malignant neoplasm of breast: Secondary | ICD-10-CM

## 2024-02-05 DIAGNOSIS — Z08 Encounter for follow-up examination after completed treatment for malignant neoplasm: Secondary | ICD-10-CM | POA: Diagnosis not present

## 2024-02-05 DIAGNOSIS — C50421 Malignant neoplasm of upper-outer quadrant of right male breast: Secondary | ICD-10-CM | POA: Diagnosis not present

## 2024-02-05 DIAGNOSIS — Z9011 Acquired absence of right breast and nipple: Secondary | ICD-10-CM | POA: Insufficient documentation

## 2024-02-05 DIAGNOSIS — C50921 Malignant neoplasm of unspecified site of right male breast: Secondary | ICD-10-CM | POA: Diagnosis not present

## 2024-02-05 DIAGNOSIS — Z9221 Personal history of antineoplastic chemotherapy: Secondary | ICD-10-CM | POA: Insufficient documentation

## 2024-02-05 DIAGNOSIS — Z853 Personal history of malignant neoplasm of breast: Secondary | ICD-10-CM | POA: Diagnosis not present

## 2024-02-05 DIAGNOSIS — R92312 Mammographic fatty tissue density, left breast: Secondary | ICD-10-CM | POA: Diagnosis not present

## 2024-02-05 DIAGNOSIS — E559 Vitamin D deficiency, unspecified: Secondary | ICD-10-CM

## 2024-02-05 DIAGNOSIS — C50911 Malignant neoplasm of unspecified site of right female breast: Secondary | ICD-10-CM

## 2024-02-05 DIAGNOSIS — Z170 Estrogen receptor positive status [ER+]: Secondary | ICD-10-CM

## 2024-02-05 DIAGNOSIS — Z87891 Personal history of nicotine dependence: Secondary | ICD-10-CM | POA: Insufficient documentation

## 2024-02-05 LAB — CBC WITH DIFFERENTIAL/PLATELET
Abs Immature Granulocytes: 0.03 10*3/uL (ref 0.00–0.07)
Basophils Absolute: 0.1 10*3/uL (ref 0.0–0.1)
Basophils Relative: 1 %
Eosinophils Absolute: 0.2 10*3/uL (ref 0.0–0.5)
Eosinophils Relative: 3 %
HCT: 45 % (ref 39.0–52.0)
Hemoglobin: 13.6 g/dL (ref 13.0–17.0)
Immature Granulocytes: 0 %
Lymphocytes Relative: 16 %
Lymphs Abs: 1.3 10*3/uL (ref 0.7–4.0)
MCH: 27.8 pg (ref 26.0–34.0)
MCHC: 30.2 g/dL (ref 30.0–36.0)
MCV: 92 fL (ref 80.0–100.0)
Monocytes Absolute: 0.8 10*3/uL (ref 0.1–1.0)
Monocytes Relative: 10 %
Neutro Abs: 5.7 10*3/uL (ref 1.7–7.7)
Neutrophils Relative %: 70 %
Platelets: 249 10*3/uL (ref 150–400)
RBC: 4.89 MIL/uL (ref 4.22–5.81)
RDW: 13.2 % (ref 11.5–15.5)
WBC: 8.1 10*3/uL (ref 4.0–10.5)
nRBC: 0 % (ref 0.0–0.2)

## 2024-02-05 LAB — COMPREHENSIVE METABOLIC PANEL WITH GFR
ALT: 22 U/L (ref 0–44)
AST: 24 U/L (ref 15–41)
Albumin: 4 g/dL (ref 3.5–5.0)
Alkaline Phosphatase: 89 U/L (ref 38–126)
Anion gap: 7 (ref 5–15)
BUN: 17 mg/dL (ref 8–23)
CO2: 28 mmol/L (ref 22–32)
Calcium: 9.2 mg/dL (ref 8.9–10.3)
Chloride: 103 mmol/L (ref 98–111)
Creatinine, Ser: 1.36 mg/dL — ABNORMAL HIGH (ref 0.61–1.24)
GFR, Estimated: 55 mL/min — ABNORMAL LOW (ref >60)
Glucose, Bld: 99 mg/dL (ref 70–99)
Potassium: 4.5 mmol/L (ref 3.5–5.1)
Sodium: 138 mmol/L (ref 135–145)
Total Bilirubin: 1 mg/dL (ref 0.0–1.2)
Total Protein: 7.1 g/dL (ref 6.5–8.1)

## 2024-02-05 LAB — VITAMIN D 25 HYDROXY (VIT D DEFICIENCY, FRACTURES): Vit D, 25-Hydroxy: 47.94 ng/mL (ref 30–100)

## 2024-02-07 ENCOUNTER — Ambulatory Visit (HOSPITAL_COMMUNITY)
Admission: RE | Admit: 2024-02-07 | Discharge: 2024-02-07 | Disposition: A | Discharge status: Home / Self Care | Source: Ambulatory Visit | Attending: Internal Medicine | Admitting: Internal Medicine

## 2024-02-07 DIAGNOSIS — R0609 Other forms of dyspnea: Secondary | ICD-10-CM | POA: Diagnosis not present

## 2024-02-07 LAB — PULMONARY FUNCTION TEST
DL/VA % pred: 81 %
DL/VA: 3.16 ml/min/mmHg/L
DLCO cor % pred: 43 %
DLCO cor: 12.73 ml/min/mmHg
DLCO unc % pred: 42 %
DLCO unc: 12.36 ml/min/mmHg
FEF 25-75 Post: 1.55 L/s
FEF 25-75 Pre: 0.52 L/s
FEF2575-%Change-Post: 198 %
FEF2575-%Pred-Post: 56 %
FEF2575-%Pred-Pre: 18 %
FEV1-%Change-Post: 36 %
FEV1-%Pred-Post: 47 %
FEV1-%Pred-Pre: 34 %
FEV1-Post: 1.8 L
FEV1-Pre: 1.31 L
FEV1FVC-%Change-Post: 20 %
FEV1FVC-%Pred-Pre: 80 %
FEV6-%Change-Post: 14 %
FEV6-%Pred-Post: 50 %
FEV6-%Pred-Pre: 44 %
FEV6-Post: 2.47 L
FEV6-Pre: 2.15 L
FEV6FVC-%Change-Post: 0 %
FEV6FVC-%Pred-Post: 102 %
FEV6FVC-%Pred-Pre: 101 %
FVC-%Change-Post: 13 %
FVC-%Pred-Post: 49 %
FVC-%Pred-Pre: 43 %
FVC-Post: 2.56 L
FVC-Pre: 2.25 L
Post FEV1/FVC ratio: 70 %
Post FEV6/FVC ratio: 97 %
Pre FEV1/FVC ratio: 59 %
Pre FEV6/FVC Ratio: 96 %
RV % pred: 97 %
RV: 2.75 L
TLC % pred: 66 %
TLC: 5.38 L

## 2024-02-07 MED ORDER — ALBUTEROL SULFATE (2.5 MG/3ML) 0.083% IN NEBU
2.5000 mg | INHALATION_SOLUTION | Freq: Once | RESPIRATORY_TRACT | Status: AC
  Administered 2024-02-07: 2.5 mg via RESPIRATORY_TRACT

## 2024-02-11 ENCOUNTER — Other Ambulatory Visit (HOSPITAL_COMMUNITY): Payer: Self-pay

## 2024-02-11 MED ORDER — BUDESONIDE-FORMOTEROL FUMARATE 80-4.5 MCG/ACT IN AERO: 2.0000 | INHALATION_SPRAY | Freq: Two times a day (BID) | RESPIRATORY_TRACT | 12 refills | Status: DC

## 2024-02-11 NOTE — Progress Notes (Unsigned)
 [name] [address], [place] [address]   CLINIC:  Medical Oncology/Hematology  PCP:  Kathyleen Parkins, MD [address] / [place] [address] [phone]   REASON FOR VISIT:  Follow-up for history of stage II invasive ductal carcinoma of the right breast  PRIOR THERAPY: - Right simple mastectomy (01/17/2011) - Tamoxifen  x 5 years  NGS Results: CHEK2 mutation  CURRENT THERAPY: Surveillance  BRIEF ONCOLOGIC HISTORY:   Oncology History  Invasive ductal carcinoma of right breast, stage 2- 2012 s/p chemo and mastectomy  12/14/2010 Initial Diagnosis   Right needle core biopsy at 9 o'clock position positive for invasive ductal carcinoma   01/17/2011 Surgery   Right simple mastectomy showing a 2.1 cm invasive ductal carcinoma, clear marhins, 0/2 lymph nodes.  ER 96%, PR 84%, Ki-67 19%, Her2 negative.   03/08/2011 - 03/09/2016 Chemotherapy   Tamoxifen  x 5 years   08/17/2014 Genetic Testing   Patient has genetic testing done forBreast/Ovarian cancer panel through [facility] . 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    08/09/2015 Pathology Results   BCI- Low risk of late recurrence years 5-10 (3.7%), low likelihood of benefit from extended endocrine therapy, low risk fo overall recurrence years 0-10 (6.6%), and low liklihood of benefit from extended endocrine therapy.   Hodgkin's [id number]  04/27/2011 Initial Diagnosis   Hodgkin's [id number]     CANCER STAGING:  Cancer Staging  Invasive ductal carcinoma of right breast, stage 2- 2012 s/p chemo and mastectomy Staging form: Breast, AJCC 7th Edition - Clinical: Stage IIA (T2, N0, cM0) - Signed by Doretta Gant, PA on 04/27/2011   INTERVAL HISTORY:   [name], a 73 y.o. male, returns for routine follow-up of his history of right-sided breast cancer. Ryan [name] was last seen on 02/06/2024 by Sheril Dines PA-C.  At  today's visit, he  reports feeling fairly well.  He denies any recent hospitalizations, surgeries, or changes in his  baseline health status. He is following with Dr. Waymond Hailey for "breathing issues."  At visit in April 2024, he had experienced 30 pounds of unintentional weight loss over the past 6 months, but PET scan at that time was negative for any evidence of malignancy.  At today's visit, his weight has improved to 206 pounds (compared to 193 pounds last year). He has chronic night sweats.  He denies any unexplained fevers or chills. He has not noticed any new pain, lymphadenopathy, or masses. He reports chronic restless legs, neuropathy, chronic cough, and chronic dyspnea on exertion. He denies any new onset headaches, seizures, or focal neurologic deficits.  He reports 25% energy and 75% appetite.  He is maintaining stable weight at this time.  ASSESSMENT & PLAN:  1.   Stage II invasive ductal carcinoma the right breast - He was initially diagnosed 12/14/2010 with invasive ductal carcinoma the right breast ER+/HER2- - Had a biopsy done on 01/17/2011. - He had a right simple mastectomy on 03/08/2011 showing a 2.1 cm invasive ductal carcinoma with clear margins, 0 out of 2 lymph nodes.  ER 96%, PR 84%, Ki-67 19%, HER-2 negative. - NGS panel revealed CHEK2 mutation - He completed his chemotherapy and started tamoxifen  in May 2012 and completed May 2017. - Oncotype score was 14. - Completed 5 years of tamoxifen  in 2017. - PHYSICAL EXAM today (02/12/2024):  Right mastectomy site is within normal limits.  No palpable masses in the left breast.  No palpable adenopathy. - Reviewed labs from 01/30/2023 which showed normal CBC and LFTs.  Baseline CKD stage IIIa.  Vitamin D  is normal. - Mammogram of the left breast (02/05/2024) was BI-RADS Category 2, benign - PLAN: RTC in 1 year for repeat labs, mammogram, and physical exam. - Discussed importance of genetic testing for CHEK2 mutation in patient's two  daughters  2.  Unintentional weight loss, RESOLVED  - In April 2024, patient reported unintentional weight loss of 30 pounds in the past 6 months (13.3% body weight) - PET scan (02/15/2023): Negative for occult malignancy. -- Weight today is 206 pounds, improved from 193 pounds a year ago - PLAN: Continue adequate caloric intake.  Continue monitoring of weight at follow-up visits.  3.   Hodgkin lymphoma - He was treated in 1983 with mantle radiation. - He was diagnosed with a right lymph node biopsy. - No palpable adenopathy or cytopenias.  4.  CKD stage II/IIIa - Creatinine is 1.17, improved from baseline  PLAN SUMMARY: >> Labs in 1 year = CBC/D, CMP >> Diagnostic mammogram of left breast in 1 year >> OFFICE visit 1 week after labs/mammogram    REVIEW OF SYSTEMS:  Review of Systems  Constitutional:  Positive for fatigue. Negative for appetite change, chills, diaphoresis, fever and unexpected weight change.  HENT:   Negative for lump/mass and nosebleeds.   Eyes:  Negative for eye problems.  Respiratory:  Positive for cough and shortness of breath. Negative for hemoptysis.   Cardiovascular:  Positive for leg swelling. Negative for chest pain and palpitations.  Gastrointestinal:  Negative for abdominal pain, blood in stool, constipation, diarrhea, nausea and vomiting.  Genitourinary:  Positive for nocturia. Negative for hematuria.   Skin: Negative.   Neurological:  Positive for numbness. Negative for dizziness, headaches and light-headedness.  Hematological:  Does not bruise/bleed easily.  Psychiatric/Behavioral:  Negative for depression and sleep disturbance.     PHYSICAL EXAM:   Performance status (ECOG): 1 - Symptomatic but completely ambulatory  There were no vitals filed for this visit. Wt Readings from Last 3 Encounters:  01/03/24 210 lb 12.8 oz (95.6 kg)  12/28/23 210 lb 6.4 oz (95.4 kg)  12/19/23 209 lb 6.4 oz (95 kg)   Physical Exam Constitutional:       Appearance: Normal appearance. He is normal weight.     Comments: Thin body habitus  Cardiovascular:     Heart sounds: Murmur heard.  Pulmonary:     Breath sounds: Normal breath sounds.  Chest:     Comments: S/p right mastectomy.  No masses of left breast.  No lymphadenopathy of bilateral supraclavicular, axillary, or pectoral regions. Neurological:     General: No focal deficit present.     Mental Status: Mental status is at baseline.  Psychiatric:        Behavior: Behavior normal. Behavior is cooperative.      PAST MEDICAL/SURGICAL HISTORY:  Past Medical History:  Diagnosis Date   Adie's pupil    Anemia 07/14/2013   Borderline diabetes    CAD (coronary artery disease), native coronary artery 04/09/2013   Dist LM stenosis 70-80% (at trifurcation into LAD, CX, & 2 Ramus branches, 70% mid RCA);; b) Myoview  08/06/14: Low Risk, no Ischemia/infarction   Depression 1983   "after I was dx'd w/CA; now just comes in spells"  (12/22/2015)   Essential hypertension    Exertional shortness of breath    Hodgkin's lymphoma (HCC) 1983   Hypercholesterolemia    Hypothyroidism  Invasive ductal carcinoma of breast, stage 2 (HCC) 04/27/2011   chemo, mastectomy   Left carotid artery partial occlusion 04/27/2011   CAROTIC DOPPLER 11/2013: < 40% R ICA stenosis, distal waveforms damped - suggests probably intracranial occlusoin, < 40% LICA, normal vertebrals -- no change   Mild aortic stenosis by prior echocardiogram 02/2018   By echo June 2020: Mild-Mod AS (Mean Gradient 13.5 mmHg)   Mild mitral stenosis by prior echocardiogram 08/06/2014   Most recent echo June 2020: Estimated valve area 2.83 cm (in 2019 was roughly 2 cm).  Mean gradient 6-8 mmHg.  Appears to be stable.   Personal history of chemotherapy    Pre-diabetes    Restless leg syndrome    Right carotid artery occlusion 04/27/2011   S/P CABG x 3 04/09/2013   LIMA-LAD, fLRAD-RI, SVG-RCA; Echo 10/29/'15:  mild Conc LVH, no WMA, Gr 1  DD, Mod AoV Sclerosis w/ Mod AI, ~Mild-Mod MS    Sleep apnea    Stroke (HCC)    2007   Thyroid  disease    Past Surgical History:  Procedure Laterality Date   BIOPSY  09/08/2020   Procedure: BIOPSY;  Surgeon: Ruby Corporal, MD;  [facility];  Service: Endoscopy;;  colon   BIOPSY  12/09/2021   Procedure: BIOPSY;  Surgeon: Urban Garden, MD;  [facility]: [facility];  Service: Gastroenterology;;   BREAST BIOPSY Right 2012   CARDIAC CATHETERIZATION  2014   CATARACT EXTRACTION W/PHACO Right 06/21/2015   Procedure: CATARACT EXTRACTION PHACO AND INTRAOCULAR LENS PLACEMENT (IOC);  Surgeon: Anner Kill, MD;  Location: [facility];  Service: Ophthalmology;  Laterality: Right;  CDE: 7.05   CATARACT EXTRACTION W/PHACO Left 07/01/2015   Procedure: CATARACT EXTRACTION PHACO AND INTRAOCULAR LENS PLACEMENT (IOC);  Surgeon: Anner Kill, MD;  Location: [facility];  Service: Ophthalmology;  Laterality: Left;  CDE: 7.49   COLONOSCOPY [name] 05/06/2015   Procedure: COLONOSCOPY;  Surgeon: Ruby Corporal, MD;  [facility]: [facility];  Service: Endoscopy;  Laterality: [name];  930   COLONOSCOPY WITH PROPOFOL  [name] 09/08/2020   Procedure: COLONOSCOPY WITH PROPOFOL ;  Surgeon: Ruby Corporal, MD;  [facility];  Service: Endoscopy;  Laterality: [name];  915   CORONARY ARTERY BYPASS GRAFT [name] 04/08/2013   Procedure: CORONARY ARTERY BYPASS GRAFTING (CABG);  Surgeon: Heriberto London, MD;  Location: [facility] OR;  Service: Open Heart Surgery;  Laterality: [name];  Times 3 using left internal mammary artery; endoscopically harvested right saphenous vein and left radial artery   ESOPHAGOGASTRODUODENOSCOPY (EGD) WITH PROPOFOL  [name] 12/09/2021   Procedure: ESOPHAGOGASTRODUODENOSCOPY (EGD) WITH PROPOFOL ;  Surgeon: Urban Garden, MD;  [facility];  Service: Gastroenterology;  Laterality: [name];  1250 ASA 2   INTRAOPERATIVE TRANSESOPHAGEAL ECHOCARDIOGRAM [name] 04/08/2013   Procedure: INTRAOPERATIVE  TRANSESOPHAGEAL ECHOCARDIOGRAM;  Surgeon: Heriberto London, MD;  Location[name];  Service: Open Heart Surgery;  Laterality: [name];   LAPAROSCOPIC APPENDECTOMY [name] 12/23/2015   Procedure: APPENDECTOMY LAPAROSCOPIC;  Surgeon: Dareen Ebbing, MD;  Location: [facility] OR;  Service: General;  Laterality: [name];   LEFT HEART CATHETERIZATION WITH CORONARY ANGIOGRAM [name] 04/07/2013   Procedure: LEFT HEART CATHETERIZATION WITH CORONARY ANGIOGRAM;  Surgeon: Arleen Lacer, MD;  Location: [name] CATH LAB;  Service: Cardiovascular;  Laterality: [name];   LYMPH NODE BIOPSY Left 1983   neck   MASTECTOMY Right 2012   w/axillary lymph node dissection   NM MYOVIEW  LTD  08/2019    EF 55 to 60%.  LOW RISK.  No ischemia or infarction noted.   NM TREADMILL MYOVIEW  LTD  08/06/2014   Exercise 5:07 min, 6.3 METS; symptoms noted or extreme dyspnea and lightheadedness with some chest tightness. No EKG changes. No ischemia or infarction was normal EF.   POLYPECTOMY  09/08/2020   Procedure: POLYPECTOMY;  Surgeon: Ruby Corporal, MD;  [facility];  Service: Endoscopy;;   RADIAL ARTERY HARVEST Left 04/08/2013   Procedure: RADIAL ARTERY HARVEST;  Surgeon: Heriberto London, MD;  Location: [facility] OR;  Service: Open Heart Surgery;  Laterality: Left;   SAVORY DILATION  12/09/2021   Procedure: SAVORY DILATION;  Surgeon: Urban Garden, MD;  [facility];  Service: Gastroenterology;;   TONSILLECTOMY  1974   TRANSTHORACIC ECHOCARDIOGRAM  08/06/2014; 02/2018   a) Nl EF 55-60%. Gr 1 DD-high LVEDP. Mod AoV Sclerosis w/o AS. Mild AI; mild MS - mean grad 7 mmHg @ HR 122 bpm.;; b) Initial: EF 55-60%, GRII DD.  HighLAP/LVEDP.  ? Bicuspid AoV -severely Ca2+.  Mild Ao root dilation.  Severe MAC, mild MS (MVA by P1/2 T & continuity equation ~2 cm).  Mod RV dilation w/ mild- mod reduced EF.---> Limited w/Defiinity: No RWMA, Mild AS, Mod MS (~mean gradient 8 mmHg   TRANSTHORACIC ECHOCARDIOGRAM  02/2020   Normal LV size and  function.  EF 65 to 70% no R WMA.  Mild LVH with basal septum.  GRII DD with elevated LVEDP.  Moderate reduced RV function with mild RV dilation, but normal PA pressures.  Mild LA dilation.  Moderate mitral stenosis, moderate AS & AI.  Mild MR, Mod MS (mean gradient 9 mmHg).  Moderate calcified AS w/ Mod AI -> Mean gradient 17.3 mmHg with peak 32 mm.   TRANSTHORACIC ECHOCARDIOGRAM  03/2019    EF 60-65%, mild concentric LVH. Gr 2 DD. No RWMA. Mild-Mod RV dilation & enlargement. Mild LA dilation. Mod MV Calcification . Mod MS (previously read as mild, but mean gradient is lower than and estimated valve area is higher than last check).  Mild-Mod AS (Mean Gradient 13.5 mmHg)    SOCIAL HISTORY:  Social History   Socioeconomic History   Marital status: Married    Spouse name: Not on file   Number of children: 2   Years of education: Not on file   Highest education level: Not on file  Occupational History   Not on file  Tobacco Use   Smoking status: Former    Current packs/day: 0.00    Average packs/day: 1 pack/day for 15.0 years (15.0 ttl pk-yrs)    Types: Cigarettes    Start date: 06/10/1967    Quit date: 06/09/1982    Years since quitting: 41.7   Smokeless tobacco: Never  Vaping Use   Vaping status: Never Used  Substance and Sexual Activity   Alcohol use: No   Drug use: No   Sexual activity: Never  Other Topics Concern   Not on file  Social History Narrative   Married. Former smoker who quit in 1983.   No routine exercise - gained ~40lb post CABG.   Social Drivers of Corporate investment banker Strain: Not on file  Food Insecurity: Not on file  Transportation Needs: Not on file  Physical Activity: Not on file  Stress: Not on file  Social Connections: Not on file  Intimate Partner Violence: Not on file    FAMILY HISTORY:  Family History  Problem Relation Age of Onset   Alzheimer's disease Mother  Stroke Mother    Hypertension Mother    Hyperlipidemia Mother    Diabetes  Mother    Hypertension Father    Diabetes Paternal Grandmother    Diabetes Paternal Grandfather    Breast cancer Paternal Aunt    Ovarian cancer Paternal Aunt     CURRENT MEDICATIONS:  Current Outpatient Medications  Medication Sig Dispense Refill   acetaminophen  (TYLENOL ) 500 MG tablet Take 500 mg by mouth every 6 (six) hours as needed for mild pain.      albuterol  (VENTOLIN  HFA) 108 (90 Base) MCG/ACT inhaler Inhale 2 puffs into the lungs as needed for wheezing or shortness of breath. 18 each 2   ALPRAZolam  (XANAX ) 1 MG tablet Take 0.5-1 mg by mouth at bedtime.     Artificial Tear Solution (SOOTHE XP OP) Place 1 drop into both eyes daily as needed (dry eyes).     aspirin  EC 81 MG tablet Take 81 mg by mouth every evening. 90 tablet 3   budesonide-formoterol (SYMBICORT) 80-4.5 MCG/ACT inhaler Inhale 2 puffs into the lungs every 12 (twelve) hours. 1 each 12   citalopram  (CELEXA ) 20 MG tablet Take 20 mg by mouth 2 (two) times daily.     diphenhydrAMINE  (BENADRYL ) 25 MG tablet Take 25 mg by mouth daily as needed for allergies.     docusate sodium  (COLACE) 100 MG capsule Take 100 mg by mouth daily.     famotidine  (PEPCID ) 20 MG tablet One after supper 30 tablet 11   Ferrous Sulfate (IRON ) 28 MG TABS Take 28 mg by mouth daily.     Ginkgo Biloba 60 MG CAPS Take 60 mg by mouth daily.     ipratropium-albuterol  (DUONEB) 0.5-2.5 (3) MG/3ML SOLN Take 3 mLs by nebulization in the morning, at noon, in the evening, and at bedtime. 360 mL 2   levothyroxine  (SYNTHROID ) 75 MCG tablet Take 75 mcg by mouth at bedtime.     Magnesium  500 MG TABS Take 500 mg by mouth 2 (two) times a week.     Menthol-Methyl Salicylate (MUSCLE RUB) 10-15 % CREA Apply 1 application topically as needed for muscle pain.     nitroGLYCERIN  (NITROSTAT ) 0.4 MG SL tablet Place 1 tablet (0.4 mg total) under the tongue every 5 (five) minutes as needed for chest pain. 25 tablet 12   OVER THE COUNTER MEDICATION Take 2 tablets by mouth  daily. Ultra kidney complex     OVER THE COUNTER MEDICATION Take 1 tablet by mouth daily. Neuro PS (phosphatidylserine)  otc supplement     OVER THE COUNTER MEDICATION Take 1 tablet by mouth daily. Advanced memory complex     OVER THE COUNTER MEDICATION Take 1 tablet by mouth daily as needed (constipation). Herbal laxative otc supplement     pantoprazole  (PROTONIX ) 40 MG tablet Take 1 tablet (40 mg total) by mouth daily. Take 30-60 min before first meal of the day 30 tablet 2   pramipexole (MIRAPEX) 0.5 MG tablet Take 0.5 mg by mouth daily.     simvastatin  (ZOCOR ) 80 MG tablet Take 80 mg by mouth every evening.      TURMERIC PO Take 454 mg by mouth daily.     vitamin B-12 (CYANOCOBALAMIN ) 500 MCG tablet Take 500 mcg by mouth daily.     No current facility-administered medications for this visit.    ALLERGIES:  No Known Allergies  LABORATORY DATA:  I have reviewed the labs as listed.     Latest Ref Rng & Units 02/05/2024  9:22 AM 12/19/2023    9:26 AM 01/30/2023    2:15 PM  CBC  WBC 4.0 - 10.5 K/uL 8.1  6.9  7.3   Hemoglobin 13.0 - 17.0 g/dL 54.0  98.1  19.1   Hematocrit 39.0 - 52.0 % 45.0  39.3  42.9   Platelets 150 - 400 K/uL 249  218  226       Latest Ref Rng & Units 02/05/2024    9:22 AM 12/19/2023    9:26 AM 01/30/2023    2:15 PM  CMP  Glucose 70 - 99 mg/dL 99  68  478   BUN 8 - 23 mg/dL 17  17  17    Creatinine 0.61 - 1.24 mg/dL 2.95  6.21  3.08   Sodium 135 - 145 mmol/L 138  141  138   Potassium 3.5 - 5.1 mmol/L 4.5  4.8  4.3   Chloride 98 - 111 mmol/L 103  105  103   CO2 22 - 32 mmol/L 28  24  28    Calcium  8.9 - 10.3 mg/dL 9.2  9.5  9.4   Total Protein 6.5 - 8.1 g/dL 7.1   6.6   Total Bilirubin 0.0 - 1.2 mg/dL 1.0   1.1   Alkaline Phos 38 - 126 U/L 89   63   AST 15 - 41 U/L 24   32   ALT 0 - 44 U/L 22   38     DIAGNOSTIC IMAGING:  I have independently reviewed the scans and discussed with the patient. MM 3D DIAGNOSTIC MAMMOGRAM UNILATERAL LEFT BREAST Result  Date: 02/05/2024 CLINICAL DATA:  73 year old male with history of right breast cancer post mastectomy 2012 as well as history of remote chest radiation for lymphoma. Patient presents for annual examination. EXAM: DIGITAL DIAGNOSTIC UNILATERAL LEFT MAMMOGRAM WITH TOMOSYNTHESIS AND CAD TECHNIQUE: Left digital diagnostic mammography and breast tomosynthesis was performed. The images were evaluated with computer-aided detection. COMPARISON:  Previous exam(s). ACR Breast Density Category a: The breast is almost entirely fatty. FINDINGS: No suspicious masses or calcifications are seen in the left breast. Small to moderate left retroareolar gynecomastia, similar to prior exams. There is no mammographic evidence of malignancy in the left breast. IMPRESSION: No mammographic evidence of malignancy in the left breast. RECOMMENDATION: Diagnostic mammography of the left breast in 1 year. I have discussed the findings and recommendations with the patient. If applicable, a reminder letter will be sent to the patient regarding the next appointment. BI-RADS CATEGORY  2: Benign. Electronically Signed   By: Alger Infield M.D.   On: 02/05/2024 08:59     WRAP UP:  All questions were answered. The patient knows to call the clinic with any problems, questions or concerns.  Medical decision making: Moderate  Time spent on visit: I spent 20 minutes counseling the patient face to face. The total time spent in the appointment was 30 minutes and more than 50% was on counseling.  Sonnie Dusky, PA-C  02/12/24 9:53 AM

## 2024-02-12 ENCOUNTER — Other Ambulatory Visit (HOSPITAL_COMMUNITY): Payer: Self-pay | Admitting: Physician Assistant

## 2024-02-12 ENCOUNTER — Inpatient Hospital Stay: Payer: Medicare Other | Attending: Physician Assistant | Admitting: Physician Assistant

## 2024-02-12 VITALS — BP 146/74 | HR 87 | Temp 97.8°F | Resp 18 | Ht 75.0 in | Wt 206.0 lb

## 2024-02-12 DIAGNOSIS — Z1231 Encounter for screening mammogram for malignant neoplasm of breast: Secondary | ICD-10-CM

## 2024-02-12 DIAGNOSIS — Z8579 Personal history of other malignant neoplasms of lymphoid, hematopoietic and related tissues: Secondary | ICD-10-CM | POA: Insufficient documentation

## 2024-02-12 DIAGNOSIS — Z923 Personal history of irradiation: Secondary | ICD-10-CM | POA: Insufficient documentation

## 2024-02-12 DIAGNOSIS — N1831 Chronic kidney disease, stage 3a: Secondary | ICD-10-CM | POA: Insufficient documentation

## 2024-02-12 DIAGNOSIS — C50921 Malignant neoplasm of unspecified site of right male breast: Secondary | ICD-10-CM

## 2024-02-12 DIAGNOSIS — C50421 Malignant neoplasm of upper-outer quadrant of right male breast: Secondary | ICD-10-CM

## 2024-02-12 DIAGNOSIS — Z08 Encounter for follow-up examination after completed treatment for malignant neoplasm: Secondary | ICD-10-CM | POA: Diagnosis not present

## 2024-02-12 DIAGNOSIS — Z803 Family history of malignant neoplasm of breast: Secondary | ICD-10-CM | POA: Diagnosis not present

## 2024-02-12 DIAGNOSIS — Z853 Personal history of malignant neoplasm of breast: Secondary | ICD-10-CM | POA: Insufficient documentation

## 2024-02-12 DIAGNOSIS — C50911 Malignant neoplasm of unspecified site of right female breast: Secondary | ICD-10-CM

## 2024-02-12 DIAGNOSIS — Z9011 Acquired absence of right breast and nipple: Secondary | ICD-10-CM | POA: Insufficient documentation

## 2024-02-12 DIAGNOSIS — Z170 Estrogen receptor positive status [ER+]: Secondary | ICD-10-CM

## 2024-02-12 DIAGNOSIS — Z9221 Personal history of antineoplastic chemotherapy: Secondary | ICD-10-CM | POA: Diagnosis not present

## 2024-02-12 DIAGNOSIS — Z87891 Personal history of nicotine dependence: Secondary | ICD-10-CM | POA: Insufficient documentation

## 2024-02-12 DIAGNOSIS — Z8572 Personal history of non-Hodgkin lymphomas: Secondary | ICD-10-CM | POA: Diagnosis not present

## 2024-02-12 DIAGNOSIS — Z1589 Genetic susceptibility to other disease: Secondary | ICD-10-CM

## 2024-02-12 NOTE — Patient Instructions (Signed)
 [facility] at [facility] **VISIT SUMMARY & IMPORTANT INSTRUCTIONS **   You were seen today by Sheril Dines PA-C for your follow-up visit.    HISTORY OF BREAST CANCER Your most recent labs, mammogram, and physical exam did not show any evidence of recurrent breast cancer at this time.  HISTORY OF LYMPHOMA You did not have any abnormal lymph nodes or lab abnormalities during today's visit.  FOLLOW-UP APPOINTMENT: Labs, mammogram, and office visit in 1 year  ** Thank you for trusting me with your healthcare!  I strive to provide all of my patients with quality care at each visit.  If you receive a survey for this visit, I would be so grateful to you for taking the time to provide feedback.  Thank you in advance!  ~ Farris Blash                   Dr. Paulett Boros   &   Sheril Dines, PA-C   - - - - - - - - - - - - - - - - - -    Thank you for choosing [facility] at [facility] to provide your oncology and hematology care.  To afford each patient quality time with our provider, please arrive at least 15 minutes before your scheduled appointment time.   If you have a lab appointment with the Cancer Center please come in thru the Main Entrance and check in at the main information desk.  You need to re-schedule your appointment should you arrive 10 or more minutes late.  We strive to give you quality time with our providers, and arriving late affects you and other patients whose appointments are after yours.  Also, if you no show three or more times for appointments you may be dismissed from the clinic at the providers discretion.     Again, thank you for choosing [facility]  Our hope is that these requests will decrease the amount of time that you wait before being seen by our physicians.       _____________________________________________________________  Should you have questions after your visit to [facility] please contact our office at [phone] and follow the prompts.  Our office hours are 8:00 a.m. and 4:30 p.m. Monday - Friday.  Please note that voicemails left after 4:00 p.m. may not be returned until the following business day.  We are closed weekends and major holidays.  You do have access to a nurse 24-7, just call the main number to the clinic [phone] and do not press any options, hold on the line and a nurse will answer the phone.    For prescription refill requests, have your pharmacy contact our office and allow 72 hours.

## 2024-02-15 ENCOUNTER — Telehealth: Payer: Self-pay

## 2024-02-15 ENCOUNTER — Other Ambulatory Visit (HOSPITAL_COMMUNITY): Payer: Self-pay

## 2024-02-15 NOTE — Telephone Encounter (Signed)
 Pharmacy Patient Advocate Encounter   Received notification from Fax that prior authorization for Symbicort  80mcg is required/requested.   Insurance verification completed.   The patient is insured through [employer] [place] .   Per test claim: The current 30 day co-pay is, $192.97.  No PA needed at this time. This test claim was processed through [facility]- copay amounts may vary at other pharmacies due to pharmacy/plan contracts, or as the patient moves through the different stages of their insurance plan.

## 2024-02-18 NOTE — Telephone Encounter (Signed)
 Tier Exception filed with [employer].  Clinical questions answered via phone on 02/16/24.  Called to check status 02/18/24 [employer] rep advised the Tier Exception was DENIED. She did offer to start a Prior Auth for generic Symbicort .  Spoke with Rosanne Commodore to complete clinical questions for PA. She has submitted PA and this will take 24-48 hours for determination.   Patient/wife have been updated.

## 2024-02-19 ENCOUNTER — Telehealth (HOSPITAL_BASED_OUTPATIENT_CLINIC_OR_DEPARTMENT_OTHER): Payer: Self-pay

## 2024-02-19 NOTE — Telephone Encounter (Signed)
 Please advise as unable to get Symbicort    Copied from CRM [id number]. Topic: Clinical - Prescription Issue >> Feb 18, 2024  4:26 PM Isabell A wrote: Reason for CRM: Patient's spouse calling to leave Carron Clap a message - spouse states she picked up the card from the company for his Symbicort  - but because they have a supplement they will not cover it.

## 2024-02-20 MED ORDER — FLUTICASONE FUROATE-VILANTEROL 100-25 MCG/ACT IN AEPB: 1.0000 | INHALATION_SPRAY | Freq: Every day | RESPIRATORY_TRACT | 11 refills | Status: DC

## 2024-02-20 NOTE — Addendum Note (Signed)
 Addended by: Alyse Bach on: 02/20/2024 03:35 PM   Modules accepted: Orders

## 2024-02-20 NOTE — Telephone Encounter (Signed)
 Spoke with patient's wife (DPR) and advised of change in medication, need to have pharmacy instruct on use, and ROV already scheduled.   Rx sent to pharmacy.   She will contact office if they need anything further.

## 2024-02-20 NOTE — Telephone Encounter (Signed)
 Can try BREO 100 one click each am but make sure pharmacist shows him how to use it and f/u 4 weeks to see if doing ok on it.

## 2024-02-20 NOTE — Telephone Encounter (Signed)
 See if they can do Breyna  80 Take 2 puffs first thing in am and then another 2 puffs about 12 hours later.

## 2024-02-20 NOTE — Telephone Encounter (Signed)
 Per [employer] Rx Breyna  is NOT on formulary for patient's plan. Only brand Symbicort , Breo, or Arnuity.

## 2024-02-20 NOTE — Telephone Encounter (Signed)
 Noted. Please see Telephone Encounter from 02/15/24 for additional information.

## 2024-02-20 NOTE — Telephone Encounter (Signed)
 Ryan [name], MD3 minutes ago (12:03 PM)   See if they can do Breyna  80 Take 2 puffs first thing in am and then another 2 puffs about 12 hours later.

## 2024-02-20 NOTE — Telephone Encounter (Signed)
 Spoke with [employer] to check status of PA for Symbicort  generic. They are requesting additional clinical information.  Spoke with pharmacy team and provided additional clinical information. They are resubmitting request. Determination will be made in 24-72 hours.

## 2024-02-20 NOTE — Telephone Encounter (Signed)
 Message copied to 02/15/24 TE.

## 2024-03-17 ENCOUNTER — Encounter: Payer: Self-pay | Admitting: Internal Medicine

## 2024-03-17 ENCOUNTER — Telehealth: Payer: Self-pay

## 2024-03-17 ENCOUNTER — Ambulatory Visit: Attending: Internal Medicine | Admitting: Internal Medicine

## 2024-03-17 VITALS — BP 138/64 | HR 84 | Ht 75.0 in | Wt 215.0 lb

## 2024-03-17 DIAGNOSIS — R0609 Other forms of dyspnea: Secondary | ICD-10-CM

## 2024-03-17 DIAGNOSIS — R9431 Abnormal electrocardiogram [ECG] [EKG]: Secondary | ICD-10-CM | POA: Insufficient documentation

## 2024-03-17 DIAGNOSIS — I10 Essential (primary) hypertension: Secondary | ICD-10-CM

## 2024-03-17 DIAGNOSIS — I350 Nonrheumatic aortic (valve) stenosis: Secondary | ICD-10-CM | POA: Diagnosis not present

## 2024-03-17 MED ORDER — FUROSEMIDE 20 MG PO TABS: 20.0000 mg | ORAL_TABLET | Freq: Every day | ORAL | 5 refills | Status: DC

## 2024-03-17 NOTE — Progress Notes (Signed)
 Cardiology Office Note  Date: 03/17/2024   ID: Ryan [name], DOB June 14, 1951, MRN [id number]  PCP:  Kathyleen Parkins, MD  Cardiologist:  Rayce Brahmbhatt P Kenshin Splawn, MD Electrophysiologist:  None   History of Present Illness: Ryan [name] is a 74 y.o. male  Referred to cardiology clinic for worsening DOE.  Ongoing DOE, orthopnea, leg swelling for the last 1 year.  He also gets exertional chest tightness prior to DOE.  Occurs few times per week.  He also gets exertional dizziness in addition to positional dizziness.  He has leg swelling 2.  No palpitations or syncope.  Specialist Notes and imaging reviewed.  Past Medical History:  Diagnosis Date   Adie's pupil    Anemia 07/14/2013   Borderline diabetes    CAD (coronary artery disease), native coronary artery 04/09/2013   Dist LM stenosis 70-80% (at trifurcation into LAD, CX, & 2 Ramus branches, 70% mid RCA);; b) Myoview  08/06/14: Low Risk, no Ischemia/infarction   Depression 1983   "after I was dx'd w/CA; now just comes in spells"  (12/22/2015)   Essential hypertension    Exertional shortness of breath    Hodgkin's lymphoma (HCC) 1983   Hypercholesterolemia    Hypothyroidism    Invasive ductal carcinoma of breast, stage 2 (HCC) 04/27/2011   chemo, mastectomy   Left carotid artery partial occlusion 04/27/2011   CAROTIC DOPPLER 11/2013: < 40% R ICA stenosis, distal waveforms damped - suggests probably intracranial occlusoin, < 40% LICA, normal vertebrals -- no change   Mild aortic stenosis by prior echocardiogram 02/2018   By echo June 2020: Mild-Mod AS (Mean Gradient 13.5 mmHg)   Mild mitral stenosis by prior echocardiogram 08/06/2014   Most recent echo June 2020: Estimated valve area 2.83 cm (in 2019 was roughly 2 cm).  Mean gradient 6-8 mmHg.  Appears to be stable.   Personal history of chemotherapy    Pre-diabetes    Restless leg syndrome    Right carotid artery occlusion 04/27/2011   S/P CABG x 3 04/09/2013    LIMA-LAD, fLRAD-RI, SVG-RCA; Echo 10/29/'15:  mild Conc LVH, no WMA, Gr 1 DD, Mod AoV Sclerosis w/ Mod AI, ~Mild-Mod MS    Sleep apnea    Stroke [facility])    2007   Thyroid  disease     Past Surgical History:  Procedure Laterality Date   BIOPSY  09/08/2020   Procedure: BIOPSY;  Surgeon: Ruby Corporal, MD;  [facility];  Service: Endoscopy;;  colon   BIOPSY  12/09/2021   Procedure: BIOPSY;  Surgeon: Urban Garden, MD;  [facility]: [facility];  Service: Gastroenterology;;   BREAST BIOPSY Right 2012   CARDIAC CATHETERIZATION  2014   CATARACT EXTRACTION W/PHACO Right 06/21/2015   Procedure: CATARACT EXTRACTION PHACO AND INTRAOCULAR LENS PLACEMENT (IOC);  Surgeon: Anner Kill, MD;  Location: [facility] OR[facility];  Service: Ophthalmology;  Laterality: Right;  CDE: 7.05   CATARACT EXTRACTION W/PHACO Left 07/01/2015   Procedure: CATARACT EXTRACTION PHACO AND INTRAOCULAR LENS PLACEMENT (IOC);  Surgeon: Anner Kill, MD;  Location: [facility];  Service: Ophthalmology;  Laterality: Left;  CDE: 7.49   COLONOSCOPY [name] 05/06/2015   Procedure: COLONOSCOPY;  Surgeon: Ruby Corporal, MD;  [facility];  Service: Endoscopy;  Laterality: [name];  930   COLONOSCOPY WITH PROPOFOL  [name] 09/08/2020   Procedure: COLONOSCOPY WITH PROPOFOL ;  Surgeon: Ruby Corporal, MD;  [facility];  Service: Endoscopy;  Laterality: [name];  915   CORONARY ARTERY BYPASS  GRAFT [name] 04/08/2013   Procedure: CORONARY ARTERY BYPASS GRAFTING (CABG);  Surgeon: Heriberto London, MD;  Location: [facility] OR;  Service: Open Heart Surgery;  Laterality: [name];  Times 3 using left internal mammary artery; endoscopically harvested right saphenous vein and left radial artery   ESOPHAGOGASTRODUODENOSCOPY (EGD) WITH PROPOFOL  [name] 12/09/2021   Procedure: ESOPHAGOGASTRODUODENOSCOPY (EGD) WITH PROPOFOL ;  Surgeon: Urban Garden, MD;  [facility];  Service: Gastroenterology;  Laterality: [name];  1250 ASA 2   INTRAOPERATIVE  TRANSESOPHAGEAL ECHOCARDIOGRAM [name] 04/08/2013   Procedure: INTRAOPERATIVE TRANSESOPHAGEAL ECHOCARDIOGRAM;  Surgeon: Heriberto London, MD;  Location: [facility] OR;  Service: Open Heart Surgery;  Laterality: [name];   LAPAROSCOPIC APPENDECTOMY [name] 12/23/2015   Procedure: APPENDECTOMY LAPAROSCOPIC;  Surgeon: Dareen Ebbing, MD;  Location: [facility] OR;  Service: General;  Laterality: [name];   LEFT HEART CATHETERIZATION WITH CORONARY ANGIOGRAM [name] 04/07/2013   Procedure: LEFT HEART CATHETERIZATION WITH CORONARY ANGIOGRAM;  Surgeon: Arleen Lacer, MD;  Location: [facility] CATH LAB;  Service: Cardiovascular;  Laterality: [name];   LYMPH NODE BIOPSY Left 1983   neck   MASTECTOMY Right 2012   w/axillary lymph node dissection   NM MYOVIEW  LTD  08/2019    EF 55 to 60%.  LOW RISK.  No ischemia or infarction noted.   NM TREADMILL MYOVIEW  LTD  08/06/2014   Exercise 5:07 min, 6.3 METS; symptoms noted or extreme dyspnea and lightheadedness with some chest tightness. No EKG changes. No ischemia or infarction was normal EF.   POLYPECTOMY  09/08/2020   Procedure: POLYPECTOMY;  Surgeon: Ruby Corporal, MD;  [facility];  Service: Endoscopy;;   RADIAL ARTERY HARVEST Left 04/08/2013   Procedure: RADIAL ARTERY HARVEST;  Surgeon: Heriberto London, MD;  Location: [facility] OR;  Service: Open Heart Surgery;  Laterality: Left;   SAVORY DILATION  12/09/2021   Procedure: SAVORY DILATION;  Surgeon: Urban Garden, MD;  [facility];  Service: Gastroenterology;;   TONSILLECTOMY  1974   TRANSTHORACIC ECHOCARDIOGRAM  08/06/2014; 02/2018   a) Nl EF 55-60%. Gr 1 DD-high LVEDP. Mod AoV Sclerosis w/o AS. Mild AI; mild MS - mean grad 7 mmHg @ HR 122 bpm.;; b) Initial: EF 55-60%, GRII DD.  HighLAP/LVEDP.  ? Bicuspid AoV -severely Ca2+.  Mild Ao root dilation.  Severe MAC, mild MS (MVA by P1/2 T & continuity equation ~2 cm).  Mod RV dilation w/ mild- mod reduced EF.---> Limited w/Defiinity: No RWMA, Mild AS, Mod MS (~mean gradient 8  mmHg   TRANSTHORACIC ECHOCARDIOGRAM  02/2020   Normal LV size and function.  EF 65 to 70% no R WMA.  Mild LVH with basal septum.  GRII DD with elevated LVEDP.  Moderate reduced RV function with mild RV dilation, but normal PA pressures.  Mild LA dilation.  Moderate mitral stenosis, moderate AS & AI.  Mild MR, Mod MS (mean gradient 9 mmHg).  Moderate calcified AS w/ Mod AI -> Mean gradient 17.3 mmHg with peak 32 mm.   TRANSTHORACIC ECHOCARDIOGRAM  03/2019    EF 60-65%, mild concentric LVH. Gr 2 DD. No RWMA. Mild-Mod RV dilation & enlargement. Mild LA dilation. Mod MV Calcification . Mod MS (previously read as mild, but mean gradient is lower than and estimated valve area is higher than last check).  Mild-Mod AS (Mean Gradient 13.5 mmHg)    Current Outpatient Medications  Medication Sig Dispense Refill   acetaminophen  (TYLENOL ) 500 MG tablet Take 500 mg by mouth every 6 (six) hours as  needed for mild pain.      albuterol  (VENTOLIN  HFA) 108 (90 Base) MCG/ACT inhaler Inhale 2 puffs into the lungs as needed for wheezing or shortness of breath. 18 each 2   ALPRAZolam  (XANAX ) 1 MG tablet Take 0.5-1 mg by mouth at bedtime. Taking half in the morning and half at bedtime     Artificial Tear Solution (SOOTHE XP OP) Place 1 drop into both eyes daily as needed (dry eyes).     aspirin  EC 81 MG tablet Take 81 mg by mouth every evening. 90 tablet 3   citalopram  (CELEXA ) 20 MG tablet Take 20 mg by mouth 2 (two) times daily.     diphenhydrAMINE  (BENADRYL ) 25 MG tablet Take 25 mg by mouth daily as needed for allergies.     docusate sodium  (COLACE) 100 MG capsule Take 100 mg by mouth daily.     Ferrous Sulfate (IRON ) 28 MG TABS Take 28 mg by mouth daily.     furosemide  (LASIX ) 20 MG tablet Take 1 tablet (20 mg total) by mouth daily. 30 tablet 5   Ginkgo Biloba 60 MG CAPS Take 60 mg by mouth daily.     ipratropium-albuterol  (DUONEB) 0.5-2.5 (3) MG/3ML SOLN Take 3 mLs by nebulization in the morning, at noon, in the  evening, and at bedtime. 360 mL 2   levothyroxine  (SYNTHROID ) 75 MCG tablet Take 75 mcg by mouth at bedtime.     Magnesium  500 MG TABS Take 500 mg by mouth 2 (two) times a week.     Menthol-Methyl Salicylate (MUSCLE RUB) 10-15 % CREA Apply 1 application topically as needed for muscle pain.     nitroGLYCERIN  (NITROSTAT ) 0.4 MG SL tablet Place 1 tablet (0.4 mg total) under the tongue every 5 (five) minutes as needed for chest pain. 25 tablet 12   OVER THE COUNTER MEDICATION Take 2 tablets by mouth daily. Ultra kidney complex     OVER THE COUNTER MEDICATION Take 1 tablet by mouth daily. Advanced memory complex     OVER THE COUNTER MEDICATION Take 1 tablet by mouth daily as needed (constipation). Herbal laxative otc supplement     pantoprazole  (PROTONIX ) 40 MG tablet Take 1 tablet (40 mg total) by mouth daily. Take 30-60 min before first meal of the day 30 tablet 2   pramipexole (MIRAPEX) 0.5 MG tablet Take 0.5 mg by mouth daily.     simvastatin  (ZOCOR ) 80 MG tablet Take 80 mg by mouth every evening.      SYMBICORT  80-4.5 MCG/ACT inhaler Inhale 2 puffs into the lungs 2 (two) times daily.     TURMERIC PO Take 454 mg by mouth daily.     vitamin B-12 (CYANOCOBALAMIN ) 500 MCG tablet Take 500 mcg by mouth daily.     No current facility-administered medications for this visit.   Allergies:  Patient has no known allergies.   Social History: The patient  reports that he quit smoking about 41 years ago. His smoking use included cigarettes. He started smoking about 56 years ago. He has a 15 pack-year smoking history. He has never used smokeless tobacco. He reports that he does not drink alcohol and does not use drugs.   Family History: The patient's family history includes Alzheimer's disease in his mother; Breast cancer in his paternal aunt; Diabetes in his mother, paternal grandfather, and paternal grandmother; Hyperlipidemia in his mother; Hypertension in his father and mother; Ovarian cancer in his  paternal aunt; Stroke in his mother.   ROS:  Please see  the history of present illness. Otherwise, complete review of systems is positive for none  All other systems are reviewed and negative.   Physical Exam: VS:  BP 138/64   Pulse 84   Ht 6\' 3"  (1.905 m)   Wt 215 lb (97.5 kg)   SpO2 95%   BMI 26.87 kg/m , BMI Body mass index is 26.87 kg/m.  Wt Readings from Last 3 Encounters:  03/17/24 215 lb (97.5 kg)  02/12/24 206 lb (93.4 kg)  01/03/24 210 lb 12.8 oz (95.6 kg)    General: Patient appears comfortable at rest. HEENT: Conjunctiva and lids normal, oropharynx clear with moist mucosa. Neck: Supple, no elevated JVP or carotid bruits, no thyromegaly. Lungs: Clear to auscultation, nonlabored breathing at rest. Cardiac: Regular rate and rhythm, no S3 or significant systolic murmur, no pericardial rub. Abdomen: Soft, nontender, no hepatomegaly, bowel sounds present, no guarding or rebound. Extremities: 1-2+ pitting edema in bilateral lower EXTR's. Skin: Warm and dry. Musculoskeletal: No kyphosis. Neuropsychiatric: Alert and oriented x3, affect grossly appropriate.  Recent Labwork: 12/19/2023: BNP 193.1; TSH 5.320 02/05/2024: ALT 22; AST 24; BUN 17; Creatinine, Ser 1.36; Hemoglobin 13.6; Platelets 249; Potassium 4.5; Sodium 138     Component Value Date/Time   CHOL 141 11/18/2019 0925   TRIG 69 11/18/2019 0925   HDL 62 11/18/2019 0925   CHOLHDL 2.3 11/18/2019 0925   CHOLHDL 5.0 03/22/2011 1630   VLDL 39 03/22/2011 1630   LDLCALC 65 11/18/2019 0925     Assessment and Plan:  Moderate aortic valve stenosis and moderate aortic valve regurgitation in 2021: Worsening DOE, chest tightness/pressure and exertional dizziness in the last 1 year.  Physical exam findings consistent with severe AS. Obtain echocardiogram. Will need referral to structural heart clinic after echocardiogram is resulted.  Moderate MS in 2020: Symptomatic now with DOE, obtain echocardiogram.  Congestive heart  failure: Worsening DOE, orthopnea and leg swelling x 1 year, will start p.o. Lasix  20 mg once daily.  Obtain echocardiogram, as stated above.  CAD s/p CABG (LIMA to LAD, SVG to RCA, L radial to ramus) in 2014: He has DOE followed by chest tightness.  Possibly he has severe aortic stenosis and might need cardiac catheterization.  Pending echocardiogram.  Continue aspirin  81 mg once daily, simvastatin  80 mg nightly.  Abnormal EKG: Patient has low amplitude P waves, A-fib versus NSR with PACs.  Will repeat EKG, schedule in 1 week (nurse visit).  Carotid artery stenosis: Occluded right carotid with mild to moderate left carotid stenosis, follows with vascular surgery.  HLD, unknown values: Continue simvastatin  80 mg nightly, goal LDL less than 55.   Specialist Notes and imaging reviewed.  Medication Adjustments/Labs and Tests Ordered: Current medicines are reviewed at length with the patient today.  Concerns regarding medicines are outlined above.    Disposition:  Follow up 3 months  Signed Ameliyah Sarno Priya Alonzo Loving, MD, 03/17/2024 3:31 PM    [facility] [address] [place], [place], [place] [address]

## 2024-03-17 NOTE — Patient Instructions (Signed)
 Medication Instructions:  Your physician has recommended you make the following change in your medication:  Start taking Lasix  20 mg once daily Continue taking all other medications as prescribed  Labwork: None  Testing/Procedures: Your physician has requested that you have an echocardiogram. Echocardiography is a painless test that uses sound waves to create images of your heart. It provides your doctor with information about the size and shape of your heart and how well your heart's chambers and valves are working. This procedure takes approximately one hour. There are no restrictions for this procedure. Please do NOT wear cologne, perfume, aftershave, or lotions (deodorant is allowed). Please arrive 15 minutes prior to your appointment time.  Please note: We ask at that you not bring children with you during ultrasound (echo/ vascular) testing. Due to room size and safety concerns, children are not allowed in the ultrasound rooms during exams. Our front office staff cannot provide observation of children in our lobby area while testing is being conducted. An adult accompanying a patient to their appointment will only be allowed in the ultrasound room at the discretion of the ultrasound technician under special circumstances. We apologize for any inconvenience.   Follow-Up: Your physician recommends that you schedule a follow-up appointment in: 3 months  Any Other Special Instructions Will Be Listed Below (If Applicable). Thank you for choosing [facility]!     If you need a refill on your cardiac medications before your next appointment, please call your pharmacy.

## 2024-03-17 NOTE — Telephone Encounter (Signed)
 Dr. Mallipeddi added after patient's visit:  Needs nurse visit in 1 week for EKG Due to low amplitude P waves on today's EKG, cannot differentiate normal versus abnormal.  Left message for patient to call back to schedule 1 week nurse visit.

## 2024-03-24 ENCOUNTER — Ambulatory Visit: Attending: Internal Medicine

## 2024-03-24 DIAGNOSIS — I10 Essential (primary) hypertension: Secondary | ICD-10-CM | POA: Insufficient documentation

## 2024-03-24 NOTE — Progress Notes (Signed)
 EKG repeated per Dr.Mallipeddi  Patient presents well just as he states  Sent to MD for review

## 2024-04-02 DIAGNOSIS — E039 Hypothyroidism, unspecified: Secondary | ICD-10-CM | POA: Diagnosis not present

## 2024-04-02 DIAGNOSIS — Z6825 Body mass index (BMI) 25.0-25.9, adult: Secondary | ICD-10-CM | POA: Diagnosis not present

## 2024-04-02 DIAGNOSIS — E663 Overweight: Secondary | ICD-10-CM | POA: Diagnosis not present

## 2024-04-02 DIAGNOSIS — F419 Anxiety disorder, unspecified: Secondary | ICD-10-CM | POA: Diagnosis not present

## 2024-04-02 DIAGNOSIS — N1831 Chronic kidney disease, stage 3a: Secondary | ICD-10-CM | POA: Diagnosis not present

## 2024-04-02 DIAGNOSIS — I10 Essential (primary) hypertension: Secondary | ICD-10-CM | POA: Diagnosis not present

## 2024-04-02 DIAGNOSIS — E1129 Type 2 diabetes mellitus with other diabetic kidney complication: Secondary | ICD-10-CM | POA: Diagnosis not present

## 2024-04-02 DIAGNOSIS — Z9229 Personal history of other drug therapy: Secondary | ICD-10-CM | POA: Diagnosis not present

## 2024-04-02 DIAGNOSIS — N401 Enlarged prostate with lower urinary tract symptoms: Secondary | ICD-10-CM | POA: Diagnosis not present

## 2024-04-03 ENCOUNTER — Ambulatory Visit: Attending: Internal Medicine

## 2024-04-03 DIAGNOSIS — R0609 Other forms of dyspnea: Secondary | ICD-10-CM | POA: Insufficient documentation

## 2024-04-03 DIAGNOSIS — I350 Nonrheumatic aortic (valve) stenosis: Secondary | ICD-10-CM | POA: Insufficient documentation

## 2024-04-04 LAB — ECHOCARDIOGRAM COMPLETE
AR max vel: 1.14 cm2
AV Area VTI: 1.09 cm2
AV Area mean vel: 1.11 cm2
AV Mean grad: 23 mmHg
AV Peak grad: 41.2 mmHg
AV Vena cont: 1 cm
Ao pk vel: 3.21 m/s
Area-P 1/2: 2.22 cm2
Calc EF: 40.1 %
MV VTI: 1.07 cm2
P 1/2 time: 285 ms
S' Lateral: 3.7 cm
Single Plane A2C EF: 33.7 %
Single Plane A4C EF: 41.8 %

## 2024-04-07 NOTE — Progress Notes (Signed)
 Ryan [name], male    DOB: 1951/06/10    MRN: [id number]   Brief patient profile:  2  yowm  quit smoking 1983 at dx of Hodgins Dz self-  referred to pulmonary clinic in [place]  12/19/2023 for doe.   S/p R breast ca 2012 > tamoxifen / no RT   S/p CABG  2014 > rehab 90% > [facility] >  [facility]   PFTs  03/12/15  no obst    History of Present Illness  12/19/2023  Pulmonary/ 1st office eval/ [facility] Deman / [facility]  Chief Complaint  Patient presents with   Consult    Self referral for SOB    Dyspnea:  walking to shop is slt incline back to house maybe 100 ft stops at  least once or twice  Cough: some am green x months  Sleep: starts in bed but then recliner x one year  x cpap  SABA use: none  02: none / does not check with  Sob once a day at rest 5 -15 min   Rec GERD diet reviewed, bed blocks rec  Make sure you check your oxygen saturation at your highest level of activity(NOT after you stop)  to be sure it stays over 90%   Stiolto 2 puffs each am trial basis - call for prescription if better walking to shop and back Augmentin  875 mg take one pill twice daily  X 10 days   Please schedule a follow up office visit in 2 weeks, sooner if needed   - Allergy screen 12/19/23  >  Eos 0.3/  IgE  184 - Sniff 01/02/2024   R phrenic nerve palsy     01/03/2024  f/u ov/[place] office/Fannie Alomar re: doe /cough  maint on stiolto  / gerd rx  Chief Complaint  Patient presents with   Follow-up    DOE  and discuss results of his PFT , 2 week follow up   Dyspnea:  it's easier to walk  Cough: much less now  Sleeping: still in recliner 45 degrees no cpap  SABA use: none  02: none  Rec See your cardiologist - no immediate concern but you need follow up  Ok to try off acid suppression after a full month to see what if any symptoms recur but cough / short of breath recur  and if so resume it.  Ok to try off stiolto but if worse breathing restart    PFT's  02/07/24  FEV1 1.80 (47 % ) ratio 0.70  p 36  % improvement from saba p 0 prior to study with DLCO  12.36 (42% %)   and FV curve classically concave    04/10/2024  f/u ov/ office/Luwanna Brossman re: AB/ R phrenic nerve paralysis  maint on symbicort  80   Chief Complaint  Patient presents with   Follow-up    DOE   Dyspnea:  house to shop 100 ft downhill and back s stopping now (improved)  Cough: worse when sleeps in bed vs recliner, non productive  SABA use: rarely  02: none      No obvious day to day or daytime variability or assoc excess/ purulent sputum or mucus plugs or hemoptysis or cp or chest tightness, subjective wheeze or overt sinus or hb symptoms.    Also denies any obvious fluctuation of symptoms with weather or environmental changes or other aggravating or alleviating factors except as outlined above   No unusual exposure hx or h/o childhood pna/ asthma or knowledge of premature  birth.  Current Allergies, Complete Past Medical History, Past Surgical History, Family History, and Social History were reviewed in [name].  ROS  The following are not active complaints unless bolded Hoarseness, sore throat, dysphagia (all worse since starting symbicort  80), dental problems, itching, sneezing,  nasal congestion or discharge of excess mucus or purulent secretions, ear ache,   fever, chills, sweats, unintended wt loss or wt gain, classically pleuritic or exertional cp,  orthopnea pnd or arm/hand swelling  or leg swelling, presyncope, palpitations, abdominal pain, anorexia, nausea, vomiting, diarrhea  or change in bowel habits or change in bladder habits, change in stools or change in urine, dysuria, hematuria,  rash, arthralgias, visual complaints, headache, numbness, weakness or ataxia or problems with walking or coordination,  change in mood or  memory.        Current Meds  Medication Sig   acetaminophen  (TYLENOL ) 500 MG tablet Take 500 mg by mouth every 6 (six) hours as needed for mild pain.     albuterol  (VENTOLIN  HFA) 108 (90 Base) MCG/ACT inhaler Inhale 2 puffs into the lungs as needed for wheezing or shortness of breath.   ALPRAZolam  (XANAX ) 1 MG tablet Take 0.5-1 mg by mouth at bedtime. Taking half in the morning and half at bedtime   Artificial Tear Solution (SOOTHE XP OP) Place 1 drop into both eyes daily as needed (dry eyes).   aspirin  EC 81 MG tablet Take 81 mg by mouth every evening.   citalopram  (CELEXA ) 20 MG tablet Take 20 mg by mouth 2 (two) times daily.   diphenhydrAMINE  (BENADRYL ) 25 MG tablet Take 25 mg by mouth daily as needed for allergies.   docusate sodium  (COLACE) 100 MG capsule Take 100 mg by mouth daily.   Ferrous Sulfate (IRON ) 28 MG TABS Take 28 mg by mouth daily.   furosemide  (LASIX ) 20 MG tablet Take 1 tablet (20 mg total) by mouth daily.   Ginkgo Biloba 60 MG CAPS Take 60 mg by mouth daily.   ipratropium-albuterol  (DUONEB) 0.5-2.5 (3) MG/3ML SOLN Take 3 mLs by nebulization in the morning, at noon, in the evening, and at bedtime.   levothyroxine  (SYNTHROID ) 75 MCG tablet Take 75 mcg by mouth at bedtime.   Magnesium  500 MG TABS Take 500 mg by mouth 2 (two) times a week.   Menthol-Methyl Salicylate (MUSCLE RUB) 10-15 % CREA Apply 1 application topically as needed for muscle pain.   nitroGLYCERIN  (NITROSTAT ) 0.4 MG SL tablet Place 1 tablet (0.4 mg total) under the tongue every 5 (five) minutes as needed for chest pain.   OVER THE COUNTER MEDICATION Take 2 tablets by mouth daily. Ultra kidney complex   OVER THE COUNTER MEDICATION Take 1 tablet by mouth daily. Advanced memory complex   OVER THE COUNTER MEDICATION Take 1 tablet by mouth daily as needed (constipation). Herbal laxative otc supplement   pramipexole (MIRAPEX) 0.5 MG tablet Take 0.5 mg by mouth daily.   simvastatin  (ZOCOR ) 80 MG tablet Take 80 mg by mouth every evening.    SYMBICORT  80-4.5 MCG/ACT inhaler Inhale 2 puffs into the lungs 2 (two) times daily.   TURMERIC PO Take 454 mg by mouth daily.    vitamin B-12 (CYANOCOBALAMIN ) 500 MCG tablet Take 500 mcg by mouth daily.          Past Medical History:  Diagnosis Date   Adie's pupil    Anemia 07/14/2013   Borderline diabetes    CAD (coronary artery disease), native coronary artery 04/09/2013  Dist LM stenosis 70-80% (at trifurcation into LAD, CX, & 2 Ramus branches, 70% mid RCA);; b) Myoview  08/06/14: Low Risk, no Ischemia/infarction   Depression 1983   after I was dx'd w/CA; now just comes in spells  (12/22/2015)   Essential hypertension    Exertional shortness of breath    Hodgkin's lymphoma (HCC) 1983   Hypercholesterolemia    Hypothyroidism    Invasive ductal carcinoma of breast, stage 2 (HCC) 04/27/2011   chemo, mastectomy   Left carotid artery partial occlusion 04/27/2011   CAROTIC DOPPLER 11/2013: < 40% R ICA stenosis, distal waveforms damped - suggests probably intracranial occlusoin, < 40% LICA, normal vertebrals -- no change   Mild aortic stenosis by prior echocardiogram 02/2018   By echo June 2020: Mild-Mod AS (Mean Gradient 13.5 mmHg)   Mild mitral stenosis by prior echocardiogram 08/06/2014   Most recent echo June 2020: Estimated valve area 2.83 cm (in 2019 was roughly 2 cm).  Mean gradient 6-8 mmHg.  Appears to be stable.   Personal history of chemotherapy    Pre-diabetes    Restless leg syndrome    Right carotid artery occlusion 04/27/2011   S/P CABG x 3 04/09/2013   LIMA-LAD, fLRAD-RI, SVG-RCA; Echo 10/29/'15:  mild Conc LVH, no WMA, Gr 1 DD, Mod AoV Sclerosis w/ Mod AI, ~Mild-Mod MS    Sleep apnea    Stroke (HCC)    2007   Thyroid  disease       Objective:    Wts  04/10/2024         205  01/03/2024       210   12/28/23 210 lb 6.4 oz (95.4 kg)  12/19/23 209 lb 6.4 oz (95 kg)  02/06/23 193 lb 5.5 oz (87.7 kg)    Vital signs reviewed  04/10/2024  - Note at rest 02 sats  98% on RA   General appearance:    elderly slt hoarse pleasant wm nad/ all smiles      HEENT : Oropharynx  clear / edentulous       Nasal turbinates nl    NECK :  without  apparent JVD/ palpable Nodes/TM    LUNGS: no acc muscle use,  Nl contour chest with decreased bs R base/ without cough on insp or exp maneuvers   CV:  RRR  no s3   2/6 SEM s increase in P2, and no edema   ABD:  soft and nontender   MS:  Gait nl   ext warm without deformities Or obvious joint restrictions  calf tenderness, cyanosis or clubbing    SKIN: warm and dry without lesions    NEURO:  alert, approp, nl sensorium with  no motor or cerebellar deficits apparent.        Assessment

## 2024-04-10 ENCOUNTER — Ambulatory Visit (INDEPENDENT_AMBULATORY_CARE_PROVIDER_SITE_OTHER): Admitting: Internal Medicine

## 2024-04-10 ENCOUNTER — Encounter: Payer: Self-pay | Admitting: Internal Medicine

## 2024-04-10 VITALS — BP 124/56 | HR 77 | Ht 75.0 in | Wt 205.0 lb

## 2024-04-10 DIAGNOSIS — R0609 Other forms of dyspnea: Secondary | ICD-10-CM

## 2024-04-10 DIAGNOSIS — Z87891 Personal history of nicotine dependence: Secondary | ICD-10-CM

## 2024-04-10 NOTE — Patient Instructions (Addendum)
 Work on inhaler technique:  relax and gently blow all the way out then take a nice smooth full deep breath back in, triggering the inhaler into the spacer a second before  you start breathing in.  Hold breath in for at least  5 seconds if you can. Blow out symbicort  80  thru nose. Rinse and gargle with water  when done.  If mouth or throat bother you at all,  try brushing teeth/gums/tongue with arm and hammer toothpaste/ make a slurry and gargle and spit out.   Make sure you check your oxygen saturation at your highest level of activity(NOT after you stop)  to be sure it stays over 90% and keep track of it at least once a week, more often if breathing getting worse, and let me know if losing ground. (Collect the dots to connect the dots approach)    Please schedule a follow up visit in 6  months but call sooner if needed   Add:  did not realize he had stopped PPI ? Related to new upper airway symptoms > if not better with spacer rec Pantoprazole  (protonix ) 40 mg   Take  30-60 min before first meal of the day and Pepcid  (famotidine )  20 mg after supper x 6 week trial then ENT eval if not better

## 2024-04-11 ENCOUNTER — Telehealth: Payer: Self-pay | Admitting: Internal Medicine

## 2024-04-11 NOTE — Assessment & Plan Note (Addendum)
 Onset around 2012 > CABG but never back to baseline  - PFTs  2016 s obstruction but reported response  to albuterol  inhaler - Echo 02/18/20   1. Left ventricular ejection fraction, by estimation, is 65 to 70%. The  left ventricle has normal function. The left ventricle has no regional  wall motion abnormalities. There is mild left ventricular hypertrophy of  the basal-septal segment. LV diastolic parameters are consistent with Grade II diastolic  dysfunction (pseudonormalization). Elevated left ventricular end-diastolic  pressure.   2. Right ventricular systolic function is moderately reduced. The right  ventricular size is mildly enlarged. There is normal pulmonary artery  systolic pressure.   3. Left atrial size was mildly dilated.   4. The mitral valve is degenerative. Mild mitral valve regurgitation.  Moderate mitral stenosis.   5. The aortic valve is tricuspid. Aortic valve regurgitation is moderate.  Moderate aortic valve stenosis  - Allergy screen 12/19/23  >  Eos 0.3/  IgE  184 - Sniff 01/02/2024   R phrenic nerve palsy  - 01/03/2024  After extensive coaching inhaler device,  effectiveness =    80% DPI  > try off stiolto and compare to taking it  - 01/03/2024   Walked on RA   x  1   lap(s) =  approx 150  ft  @ slow pace, stopped due to felt light headed  with lowest 02 sats 94% s sob   - PFT's  02/07/24  FEV1 1.80 (47 % ) ratio 0.70  p 36 % improvement from saba p 0 prior to study with DLCO  12.36 (42% %)   and FV curve classically concave   - Echo 04/03/24  Mod AS/Severe MS with LAE still mild  - 04/10/2024  After extensive coaching inhaler device,  effectiveness =    80% with spacer/ hfa > continue symb 80 2bid via spacer   Clearly his doe is multfactorial but improved on synmb 80 with only issue being likely Upper airway irritation from ICS so spent time teaching how to use spacer effectively and encourage him to monitor  02 sats with ex         Each maintenance medication was reviewed  in detail including emphasizing most importantly the difference between maintenance and prns and under what circumstances the prns are to be triggered using an action plan format where appropriate.  Total time for H and P, chart review, counseling, reviewing hfa/spacer  device(s) and generating customized AVS unique to this office visit / same day charting = 24 min        F/u in 6 m- call sooner if needed with close cards f/u in meantime.

## 2024-04-11 NOTE — Telephone Encounter (Signed)
 I did not see that he had stopped his protonix  before first meal of the day  - if his hoarsness and dysphagia aren't better with use of spacer then needs to start back on   Pantoprazole  (protonix ) 40 mg   Take  30-60 min before first meal of the day and Pepcid  (famotidine )  20 mg after supper  for at least 6 weeks to see if this is the source of his throat symptoms

## 2024-04-15 ENCOUNTER — Telehealth: Payer: Self-pay | Admitting: Internal Medicine

## 2024-04-15 NOTE — Telephone Encounter (Signed)
-----   Message from Vishnu P Mallipeddi sent at 04/14/2024  3:03 PM EDT ----- Normal sinus rhythm on EKG.  No change in the plan. ----- Message ----- From: Myriam Fine Sent: 03/24/2024   8:05 AM EDT To: Vishnu P Mallipeddi, MD

## 2024-04-15 NOTE — Telephone Encounter (Signed)
 Patient informed and verbalized understanding of plan.

## 2024-04-16 ENCOUNTER — Other Ambulatory Visit: Payer: Self-pay

## 2024-04-16 DIAGNOSIS — R0609 Other forms of dyspnea: Secondary | ICD-10-CM

## 2024-04-16 MED ORDER — PANTOPRAZOLE SODIUM 40 MG PO TBEC: 40.0000 mg | DELAYED_RELEASE_TABLET | Freq: Every day | ORAL | 2 refills | Status: DC

## 2024-04-16 MED ORDER — FAMOTIDINE 20 MG PO TABS: ORAL_TABLET | ORAL | 11 refills | Status: AC

## 2024-04-16 NOTE — Telephone Encounter (Signed)
 Spoke with pt and he was fine with starting back on pecid and Protonix , sent both to the pharmacy. NFN

## 2024-04-23 DIAGNOSIS — I519 Heart disease, unspecified: Secondary | ICD-10-CM | POA: Diagnosis not present

## 2024-04-23 DIAGNOSIS — Z7689 Persons encountering health services in other specified circumstances: Secondary | ICD-10-CM | POA: Diagnosis not present

## 2024-04-23 DIAGNOSIS — F419 Anxiety disorder, unspecified: Secondary | ICD-10-CM | POA: Diagnosis not present

## 2024-04-23 DIAGNOSIS — Z8571 Personal history of Hodgkin lymphoma: Secondary | ICD-10-CM | POA: Diagnosis not present

## 2024-04-23 DIAGNOSIS — Z87891 Personal history of nicotine dependence: Secondary | ICD-10-CM | POA: Diagnosis not present

## 2024-04-23 DIAGNOSIS — F32A Depression, unspecified: Secondary | ICD-10-CM | POA: Diagnosis not present

## 2024-04-23 DIAGNOSIS — E039 Hypothyroidism, unspecified: Secondary | ICD-10-CM | POA: Diagnosis not present

## 2024-04-30 DIAGNOSIS — E039 Hypothyroidism, unspecified: Secondary | ICD-10-CM | POA: Diagnosis not present

## 2024-04-30 DIAGNOSIS — Z131 Encounter for screening for diabetes mellitus: Secondary | ICD-10-CM | POA: Diagnosis not present

## 2024-04-30 DIAGNOSIS — I519 Heart disease, unspecified: Secondary | ICD-10-CM | POA: Diagnosis not present

## 2024-04-30 DIAGNOSIS — Z125 Encounter for screening for malignant neoplasm of prostate: Secondary | ICD-10-CM | POA: Diagnosis not present

## 2024-05-06 ENCOUNTER — Other Ambulatory Visit (HOSPITAL_COMMUNITY): Payer: Self-pay

## 2024-05-06 DIAGNOSIS — M436 Torticollis: Secondary | ICD-10-CM

## 2024-05-06 DIAGNOSIS — Z7182 Exercise counseling: Secondary | ICD-10-CM | POA: Diagnosis not present

## 2024-05-06 DIAGNOSIS — E119 Type 2 diabetes mellitus without complications: Secondary | ICD-10-CM | POA: Diagnosis not present

## 2024-05-06 DIAGNOSIS — Z7985 Long-term (current) use of injectable non-insulin antidiabetic drugs: Secondary | ICD-10-CM | POA: Diagnosis not present

## 2024-05-06 DIAGNOSIS — K5909 Other constipation: Secondary | ICD-10-CM | POA: Diagnosis not present

## 2024-05-06 DIAGNOSIS — F419 Anxiety disorder, unspecified: Secondary | ICD-10-CM | POA: Diagnosis not present

## 2024-05-06 DIAGNOSIS — I519 Heart disease, unspecified: Secondary | ICD-10-CM | POA: Diagnosis not present

## 2024-05-06 DIAGNOSIS — Z713 Dietary counseling and surveillance: Secondary | ICD-10-CM | POA: Diagnosis not present

## 2024-05-06 DIAGNOSIS — F32A Depression, unspecified: Secondary | ICD-10-CM | POA: Diagnosis not present

## 2024-05-06 DIAGNOSIS — Z6826 Body mass index (BMI) 26.0-26.9, adult: Secondary | ICD-10-CM | POA: Diagnosis not present

## 2024-05-07 ENCOUNTER — Ambulatory Visit (HOSPITAL_COMMUNITY)
Admission: RE | Admit: 2024-05-07 | Discharge: 2024-05-07 | Disposition: A | Discharge status: Home / Self Care | Source: Ambulatory Visit

## 2024-05-07 DIAGNOSIS — M50323 Other cervical disc degeneration at C6-C7 level: Secondary | ICD-10-CM | POA: Diagnosis not present

## 2024-05-07 DIAGNOSIS — M436 Torticollis: Secondary | ICD-10-CM | POA: Insufficient documentation

## 2024-05-07 DIAGNOSIS — M50222 Other cervical disc displacement at C5-C6 level: Secondary | ICD-10-CM | POA: Diagnosis not present

## 2024-05-08 ENCOUNTER — Ambulatory Visit: Payer: Self-pay | Admitting: Internal Medicine

## 2024-05-08 DIAGNOSIS — I350 Nonrheumatic aortic (valve) stenosis: Secondary | ICD-10-CM

## 2024-05-08 DIAGNOSIS — R0609 Other forms of dyspnea: Secondary | ICD-10-CM

## 2024-05-08 NOTE — Telephone Encounter (Signed)
-----   Message from Vishnu P Mallipeddi sent at 05/08/2024 11:35 AM EDT ----- LV function 40 to 45%, indeterminate diastology, RV function mildly reduced and RV size moderately enlarged, severe MS (mean mitral gradient 11 mmHg), moderate dilatation of the aortic root 44 mm,  moderate to severe AR, eccentric jet.  Likely low-flow, low gradient severe AS is present.  LV function is reduced compared to prior echocardiogram in 2021.  Likely secondary to severe valvular heart disease.  Currently on p.o. Lasix  20 mg once daily for DOE.  If his DOE is not improved,  increase Lasix  from 20 mg to 40 mg once daily.  He will need LHC and RHC.  Please refer to structural heart clinic ASAP. ----- Message ----- From: Interface, [employer] Sent: 04/04/2024   4:47 PM EDT To: Vishnu P Mallipeddi, MD

## 2024-05-08 NOTE — Telephone Encounter (Signed)
 The patient has been notified of the result and verbalized understanding.  All questions (if any) were answered. Patient stated that's PCP had advised him to take Lasix  40 mg Tuesday, Thursday and Saturday and has been working better. Littie CHRISTELLA Croak, CMA 05/08/2024 1:02 PM

## 2024-05-10 NOTE — Progress Notes (Addendum)
 Patient ID: Ryan [name] MRN: [id number] DOB/AGE: 1951/09/25 73 y.o.  Primary Care Physician:Hall, Norleen PEDLAR, MD Primary Cardiologist: Mallipeddi  CC:  Aortic valvular disease management     FOCUSED PROBLEM LIST:   Aortic stenosis AVA 1.1, MG 23, SVI 33 V-max 3.2, EF 40 to 45% TTE June 2025 EKG NSR with RBBB + LAFB Mitral stenosis MG 11.3, severe MAC, EF 40 to 45% TTE June 2025 CAD s/p CABG LIMA to LAD, left radial to ramus, and vein graft to RCA 2014 T2DM Not on insulin  Hyperlipidemia Aortic atherosclerosis Chest x-ray 2024 Invasive ductal breast carcinoma Status post Tamoxifen  and R mastectomy 2012 Hodgkin's lymphoma Status post mantle XRT 1983 CKD stage IIIa Carotid disease Right distal ICA occlusion versus obstruction, left ICA 1 to 39% Dopplers 2025 Asthma BMI 26.8/BSA 2.10 May 2024:  Patient consents to use of AI scribe. The patient is a 73 year old male with the above listed complex medical history who is referred for recommendations regarding the patient's aortic and mitral valvular disease.  The patient was seen by his general cardiologist in June.  At that point in time he endorsed exertional chest discomfort as well as exertional dyspnea.  He also noted some dizziness as well.  An echocardiogram demonstrated low-flow low gradient aortic stenosis with a severely calcified aortic valve with restricted motion.  The mitral valve was also quite diseased with a mean gradient of 11 mmHg and severe posterior MAC.  Over the past few years, he has experienced worsening fatigue and shortness of breath, which have become more pronounced in the last year. Despite using a Symbicort  inhaler for bronchial asthma, he continues to experience significant fatigue and shortness of breath with minimal exertion, such as walking to the bathroom. He has not experienced chest pain since his heart surgery but reports increased lethargy and difficulty sleeping, often sleeping in a recliner  due to breathing difficulties when lying flat.  He has a known heart murmur since his heart surgery and has been informed of two valve issues. He feels more fatigued and less active over the past couple of years. He has also noticed increased lethargy and difficulty sleeping, often sleeping in a recliner due to breathing difficulties when lying flat.  He experiences lightheadedness when transitioning from sitting to standing and has had episodes of dizziness. He also experiences neuropathy in his legs, affecting his balance and causing him to be off-balance at times.   He takes Lasix , which has helped with edema and swelling in his legs, improving his breathing and reducing wheezing.  His social history includes a past of being more active, enjoying outdoor activities like fishing, but he has become more sedentary due to his health issues. He occasionally goes grocery shopping but finds it a nerve-wracking experience. He has noticed a decrease in his physical activity and interest in activities over the past few years.    He has both upper and lower dentures.   Past Medical History:  Diagnosis Date   Adie's pupil    Anemia 07/14/2013   Borderline diabetes    CAD (coronary artery disease), native coronary artery 04/09/2013   Dist LM stenosis 70-80% (at trifurcation into LAD, CX, & 2 Ramus branches, 70% mid RCA);; b) Myoview  08/06/14: Low Risk, no Ischemia/infarction   Depression 1983   after I was dx'd w/CA; now just comes in spells  (12/22/2015)   Essential hypertension    Exertional shortness of breath    Hodgkin's lymphoma (HCC) 1983   Hypercholesterolemia  Hypothyroidism    Invasive ductal carcinoma of breast, stage 2 (HCC) 04/27/2011   chemo, mastectomy   Left carotid artery partial occlusion 04/27/2011   CAROTIC DOPPLER 11/2013: < 40% R ICA stenosis, distal waveforms damped - suggests probably intracranial occlusoin, < 40% LICA, normal vertebrals -- no change   Mild aortic  stenosis by prior echocardiogram 02/2018   By echo June 2020: Mild-Mod AS (Mean Gradient 13.5 mmHg)   Mild mitral stenosis by prior echocardiogram 08/06/2014   Most recent echo June 2020: Estimated valve area 2.83 cm (in 2019 was roughly 2 cm).  Mean gradient 6-8 mmHg.  Appears to be stable.   Personal history of chemotherapy    Pre-diabetes    Restless leg syndrome    Right carotid artery occlusion 04/27/2011   S/P CABG x 3 04/09/2013   LIMA-LAD, fLRAD-RI, SVG-RCA; Echo 10/29/'15:  mild Conc LVH, no WMA, Gr 1 DD, Mod AoV Sclerosis w/ Mod AI, ~Mild-Mod MS    Sleep apnea    Stroke [facility])    2007   Thyroid  disease     Past Surgical History:  Procedure Laterality Date   BIOPSY  09/08/2020   Procedure: BIOPSY;  Surgeon: Golda Claudis PENNER, MD;  [facility];  Service: Endoscopy;;  colon   BIOPSY  12/09/2021   Procedure: BIOPSY;  Surgeon: Eartha Angelia Sieving, MD;  Location: [facility];  Service: Gastroenterology;;   BREAST BIOPSY Right 2012   CARDIAC CATHETERIZATION  2014   CATARACT EXTRACTION W/PHACO Right 06/21/2015   Procedure: CATARACT EXTRACTION PHACO AND INTRAOCULAR LENS PLACEMENT (IOC);  Surgeon: Cherene Mania, MD;  Location: [facility];  Service: Ophthalmology;  Laterality: Right;  CDE: 7.05   CATARACT EXTRACTION W/PHACO Left 07/01/2015   Procedure: CATARACT EXTRACTION PHACO AND INTRAOCULAR LENS PLACEMENT (IOC);  Surgeon: Cherene Mania, MD;  Location: [facility];  Service: Ophthalmology;  Laterality: Left;  CDE: 7.49   COLONOSCOPY [name] 05/06/2015   Procedure: COLONOSCOPY;  Surgeon: Claudis PENNER Golda, MD;  [facility];  Service: Endoscopy;  Laterality: [name];  930   COLONOSCOPY WITH PROPOFOL  [name] 09/08/2020   Procedure: COLONOSCOPY WITH PROPOFOL ;  Surgeon: Golda Claudis PENNER, MD;  [facility];  Service: Endoscopy;  Laterality: [name];  915   CORONARY ARTERY BYPASS GRAFT [name] 04/08/2013   Procedure: CORONARY ARTERY BYPASS GRAFTING (CABG);  Surgeon: Maude Fleeta Ochoa,  MD;  Location: [facility] OR;  Service: Open Heart Surgery;  Laterality: [name];  Times 3 using left internal mammary artery; endoscopically harvested right saphenous vein and left radial artery   ESOPHAGOGASTRODUODENOSCOPY (EGD) WITH PROPOFOL  [name] 12/09/2021   Procedure: ESOPHAGOGASTRODUODENOSCOPY (EGD) WITH PROPOFOL ;  Surgeon: Eartha Angelia Sieving, MD;  [facility];  Service: Gastroenterology;  Laterality: [name];  1250 ASA 2   INTRAOPERATIVE TRANSESOPHAGEAL ECHOCARDIOGRAM [name] 04/08/2013   Procedure: INTRAOPERATIVE TRANSESOPHAGEAL ECHOCARDIOGRAM;  Surgeon: Maude Fleeta Ochoa, MD;  Location: [facility] OR;  Service: Open Heart Surgery;  Laterality: [name];   LAPAROSCOPIC APPENDECTOMY [name] 12/23/2015   Procedure: APPENDECTOMY LAPAROSCOPIC;  Surgeon: Donnice Lima, MD;  Location: [facility] OR;  Service: General;  Laterality: [name];   LEFT HEART CATHETERIZATION WITH CORONARY ANGIOGRAM [name] 04/07/2013   Procedure: LEFT HEART CATHETERIZATION WITH CORONARY ANGIOGRAM;  Surgeon: Alm LELON Clay, MD;  Location: [facility] CATH LAB;  Service: Cardiovascular;  Laterality: [name];   LYMPH NODE BIOPSY Left 1983   neck   MASTECTOMY Right 2012   w/axillary lymph node dissection   NM MYOVIEW  LTD  08/2019    EF 55  to 60%.  LOW RISK.  No ischemia or infarction noted.   NM TREADMILL MYOVIEW  LTD  08/06/2014   Exercise 5:07 min, 6.3 METS; symptoms noted or extreme dyspnea and lightheadedness with some chest tightness. No EKG changes. No ischemia or infarction was normal EF.   POLYPECTOMY  09/08/2020   Procedure: POLYPECTOMY;  Surgeon: Golda Claudis PENNER, MD;  [facility];  Service: Endoscopy;;   RADIAL ARTERY HARVEST Left 04/08/2013   Procedure: RADIAL ARTERY HARVEST;  Surgeon: Maude Fleeta Ochoa, MD;  Location: [facility] OR;  Service: Open Heart Surgery;  Laterality: Left;   SAVORY DILATION  12/09/2021   Procedure: SAVORY DILATION;  Surgeon: Eartha Angelia Sieving, MD;  [facility];  Service: Gastroenterology;;   TONSILLECTOMY   1974   TRANSTHORACIC ECHOCARDIOGRAM  08/06/2014; 02/2018   a) Nl EF 55-60%. Gr 1 DD-high LVEDP. Mod AoV Sclerosis w/o AS. Mild AI; mild MS - mean grad 7 mmHg @ HR 122 bpm.;; b) Initial: EF 55-60%, GRII DD.  HighLAP/LVEDP.  ? Bicuspid AoV -severely Ca2+.  Mild Ao root dilation.  Severe MAC, mild MS (MVA by P1/2 T & continuity equation ~2 cm).  Mod RV dilation w/ mild- mod reduced EF.---> Limited w/Defiinity: No RWMA, Mild AS, Mod MS (~mean gradient 8 mmHg   TRANSTHORACIC ECHOCARDIOGRAM  02/2020   Normal LV size and function.  EF 65 to 70% no R WMA.  Mild LVH with basal septum.  GRII DD with elevated LVEDP.  Moderate reduced RV function with mild RV dilation, but normal PA pressures.  Mild LA dilation.  Moderate mitral stenosis, moderate AS & AI.  Mild MR, Mod MS (mean gradient 9 mmHg).  Moderate calcified AS w/ Mod AI -> Mean gradient 17.3 mmHg with peak 32 mm.   TRANSTHORACIC ECHOCARDIOGRAM  03/2019    EF 60-65%, mild concentric LVH. Gr 2 DD. No RWMA. Mild-Mod RV dilation & enlargement. Mild LA dilation. Mod MV Calcification . Mod MS (previously read as mild, but mean gradient is lower than and estimated valve area is higher than last check).  Mild-Mod AS (Mean Gradient 13.5 mmHg)    Family History  Problem Relation Age of Onset   Alzheimer's disease Mother    Stroke Mother    Hypertension Mother    Hyperlipidemia Mother    Diabetes Mother    Hypertension Father    Diabetes Paternal Grandmother    Diabetes Paternal Grandfather    Breast cancer Paternal Aunt    Ovarian cancer Paternal Aunt     Social History   Socioeconomic History   Marital status: Married    Spouse name: Not on file   Number of children: 2   Years of education: Not on file   Highest education level: Not on file  Occupational History   Not on file  Tobacco Use   Smoking status: Former    Current packs/day: 0.00    Average packs/day: 1 pack/day for 15.0 years (15.0 ttl pk-yrs)    Types: Cigarettes    Start date:  06/10/1967    Quit date: 06/09/1982    Years since quitting: 41.9   Smokeless tobacco: Never  Vaping Use   Vaping status: Never Used  Substance and Sexual Activity   Alcohol use: No   Drug use: No   Sexual activity: Never  Other Topics Concern   Not on file  Social History Narrative   Married. Former smoker who quit in 1983.   No routine exercise - gained ~40lb post  CABG.   Social Drivers of Health   Financial Resource Strain: Not on file  Food Insecurity: Not on file  Transportation Needs: Not on file  Physical Activity: Not on file  Stress: Not on file  Social Connections: Not on file  Intimate Partner Violence: Not on file     Prior to Admission medications   Medication Sig Start Date End Date Taking? Authorizing Provider  acetaminophen  (TYLENOL ) 500 MG tablet Take 500 mg by mouth every 6 (six) hours as needed for mild pain.     [provider]  albuterol  (VENTOLIN  HFA) 108 (90 Base) MCG/ACT inhaler Inhale 2 puffs into the lungs as needed for wheezing or shortness of breath. 01/29/24   [name], MD  ALPRAZolam  (XANAX ) 1 MG tablet Take 0.5-1 mg by mouth at bedtime. Taking half in the morning and half at bedtime    [provider]  Artificial Tear Solution (SOOTHE XP OP) Place 1 drop into both eyes daily as needed (dry eyes).    [provider]  aspirin  EC 81 MG tablet Take 81 mg by mouth every evening. 09/09/20   Rehman, Claudis PENNER, MD  citalopram  (CELEXA ) 20 MG tablet Take 20 mg by mouth 2 (two) times daily. 11/28/23   [provider]  diphenhydrAMINE  (BENADRYL ) 25 MG tablet Take 25 mg by mouth daily as needed for allergies.    [provider]  docusate sodium  (COLACE) 100 MG capsule Take 100 mg by mouth daily.    [provider]  famotidine  (PEPCID ) 20 MG tablet One after supper 04/16/24   Wert, Michael B, MD  Ferrous Sulfate (IRON ) 28 MG TABS Take 28 mg by mouth daily.    [provider]  furosemide  (LASIX ) 20 MG  tablet Take 1 tablet (20 mg total) by mouth daily. 03/17/24   Mallipeddi, Vishnu P, MD  Ginkgo Biloba 60 MG CAPS Take 60 mg by mouth daily.    [provider]  ipratropium-albuterol  (DUONEB) 0.5-2.5 (3) MG/3ML SOLN Take 3 mLs by nebulization in the morning, at noon, in the evening, and at bedtime. 01/29/24   [name], MD  levothyroxine  (SYNTHROID ) 75 MCG tablet Take 75 mcg by mouth at bedtime. 10/31/23   [provider]  Magnesium  500 MG TABS Take 500 mg by mouth 2 (two) times a week.    [provider]  Menthol-Methyl Salicylate (MUSCLE RUB) 10-15 % CREA Apply 1 application topically as needed for muscle pain.    [provider]  nitroGLYCERIN  (NITROSTAT ) 0.4 MG SL tablet Place 1 tablet (0.4 mg total) under the tongue every 5 (five) minutes as needed for chest pain. 12/04/19   [name], MD  OVER THE COUNTER MEDICATION Take 2 tablets by mouth daily. Ultra kidney complex    [provider]  OVER THE COUNTER MEDICATION Take 1 tablet by mouth daily. Advanced memory complex    [provider]  OVER THE COUNTER MEDICATION Take 1 tablet by mouth daily as needed (constipation). Herbal laxative otc supplement    [provider]  pantoprazole  (PROTONIX ) 40 MG tablet Take 1 tablet (40 mg total) by mouth daily. Take 30-60 min before first meal of the day 04/16/24   [name], MD  pramipexole (MIRAPEX) 0.5 MG tablet Take 0.5 mg by mouth daily. 12/02/23   [provider]  simvastatin  (ZOCOR ) 80 MG tablet Take 80 mg by mouth every evening.  10/13/19   [provider]  SYMBICORT  80-4.5 MCG/ACT inhaler  Inhale 2 puffs into the lungs 2 (two) times daily. 02/21/24   [provider]  TURMERIC PO Take 454 mg by mouth daily.    [provider]  vitamin B-12 (CYANOCOBALAMIN ) 500 MCG tablet Take 500 mcg by mouth daily.    [provider]    No Known Allergies  REVIEW OF SYSTEMS:  General: no  fevers/chills/night sweats Eyes: no blurry vision, diplopia, or amaurosis ENT: no sore throat or hearing loss Resp: no cough, wheezing, or hemoptysis CV: no edema or palpitations GI: no abdominal pain, nausea, vomiting, diarrhea, or constipation GU: no dysuria, frequency, or hematuria Skin: no rash Neuro: no headache, numbness, tingling, or weakness of extremities Musculoskeletal: no joint pain or swelling Heme: no bleeding, DVT, or easy bruising Endo: no polydipsia or polyuria  BP (!) 172/68   Pulse 80   Ht 6' 3 (1.905 m)   Wt 202 lb 3.2 oz (91.7 kg)   SpO2 96%   BMI 25.27 kg/m   PHYSICAL EXAM: GEN:  AO x 3 in no acute distress HEENT: normal Dentition: Upper and lower dentures Neck: JVP normal. +1 carotid upstrokes without bruits. No thyromegaly. Lungs: equal expansion, clear bilaterally CV: Apex is discrete and nondisplaced, RRR with 2 out of 6 systolic ejection murmur Abd: soft, non-tender, non-distended; no bruit; positive bowel sounds Ext: no edema, ecchymoses, or cyanosis Vascular: 2+ femoral pulses, 2+ radial pulses       Skin: warm and dry without rash Neuro: CN II-XII grossly intact; motor and sensory grossly intact    DATA AND STUDIES:  EKG:   EKG Interpretation Date/Time:  Monday May 12 2024 09:09:32 EDT Ventricular Rate:  80 PR Interval:  264 QRS Duration:  162 QT Interval:  434 QTC Calculation: 500 R Axis:   -70  Text Interpretation: Sinus rhythm with 1st degree A-V block Right bundle branch block Left anterior fascicular block Bifascicular block Minimal voltage criteria for LVH, may be normal variant ( R in aVL ) When compared with ECG of 24-Mar-2024 08:02, Previous ECG has undetermined rhythm, needs review Confirmed by Wendel Haws ([id number]) on 05/12/2024 9:13:36 AM        CARDIAC STUDIES: Refer to CV Procedures and Imaging Tabs  12/19/2023: BNP 193.1; TSH 5.320 02/05/2024: ALT 22; BUN 17; Creatinine, Ser 1.36; Hemoglobin 13.6; Platelets 249;  Potassium 4.5; Sodium 138   STS RISK CALCULATOR: Pending  NHYA CLASS: 2    ASSESSMENT AND PLAN:   1. Nonrheumatic aortic valve stenosis   2. Nonrheumatic mitral valve stenosis   3. S/P CABG x 3  LIMA-LAD, free LRA to RI, SVG-RCA  - 04/09/13   4. Type 2 diabetes mellitus without complication, without long-term current use of insulin  (HCC)   5. Hyperlipidemia associated with type 2 diabetes mellitus (HCC)   6. Aortic atherosclerosis (HCC)   7. Infiltrating ductal carcinoma of breast, stage 2, unspecified laterality (HCC)   8. Hodgkin lymphoma, unspecified Hodgkin lymphoma type, unspecified body region (HCC)   9. Stage 3a chronic kidney disease (HCC)   10. Bilateral carotid artery stenosis   11. BMI 26.0-26.9,adult   12. Right bundle branch block (RBBB) with left anterior fascicular block (LAFB)   13. Elevated blood pressure reading in office without diagnosis of hypertension     AS:  The patient has a highly calcified aortic valve with indices consistent with low-flow low gradient aortic stenosis with a stroke-volume index of less than 35 cc/m.  He has multiple etiologies contributing to his shortness of breath  and fatigue including asthma, a dysfunctional left hemidiaphragm, and severe valvular disease.  I had a long conversation with the patient and his family.  Given his symptoms of lightheadedness, fatigue And shortness of breath in conjunction with his aortic valvular disease we will pursue an evaluation.  I will refer him for coronary angiography, right heart catheterization, TAVR protocol CTA, and a cardiothoracic surgical evaluation.  Further recommendations will be informed by the results of this testing. MS: The patient has a highly calcified mitral valve with at least posterior MAC seen on his most recent echocardiogram.  We will review his TAVR protocol CTA for his pattern of MAC and neo-LVOT.  Given his comorbidities I do not think he is a candidate for a double valve surgery.   Will consider isolated aortic valve intervention with a TAVR if not deemed a surgical candidate and then decide about a mitral valve intervention in the future depending on symptoms. CABG: Continue aspirin  81 mg, simvastatin  80 mg, as needed nitroglycerin  T2DM: Continue aspirin  81 mg, simvastatin  80 mg, consider ARB and SGLT2 inhibitor in the future. HL: Continue simvastatin  80 mg. Aortic atherosclerosis: Continue aspirin  81 mg, simvastatin  80 mg. Ductal breast carcinoma:  In remission, followed by oncology. Hodgkin's lymphomoa:  S/p mantle radiation and likely the etiology of his aortic and mitral disease.  Likely not a candidate for surgical valve replacement.  Followed by oncology. CKD stage IIIa: Consider ARB and SGLT2 inhibitor in the future. Carotid disease: Either high-grade obstruction or complete occlusion of left distal ICA; if TAVR is pursued embolic protection may not be possible if it were to be considered.  Elevated BMI: The patient's BSA is over 2.  Therefore a larger valve area if a TAVR is pursued would be optimal. Bifascicular block: The patient is at higher risk for heart block.  We will review his membranous septum.  If a TAVR is pursued we will pursue right IJ pacing.  Repeat EKG today demonstrated sinus rhythm with bifascicular block and no atrial fibrillation. Elevated blood pressure: Given significant valvular disease would monitor for now and address after valve intervention.   I have personally reviewed the patients imaging data as summarized above.  I have reviewed the natural history of aortic stenosis with the patient and family members who are present today. We have discussed the limitations of medical therapy and the poor prognosis associated with symptomatic aortic stenosis. We have also reviewed potential treatment options, including palliative medical therapy, conventional surgical aortic valve replacement, and transcatheter aortic valve replacement. We discussed  treatment options in the context of this patient's specific comorbid medical conditions.   All of the patient's questions were answered today. Will make further recommendations based on the results of studies outlined above.   I spent 51 minutes reviewing all clinical data during and prior to this visit including all relevant imaging studies, laboratories, clinical information from other health systems and prior notes from both Cardiology and other specialties, interviewing the patient, conducting a complete physical examination, and coordinating care in order to formulate a comprehensive and personalized evaluation and treatment plan.   Mable Dara K Arabia Nylund, MD  05/12/2024 10:27 AM    [facility] [address] [place], [place], [place]  [address] Phone: [phone]; Fax: [phone]

## 2024-05-10 NOTE — H&P (View-Only) (Signed)
 Patient ID: Ryan [name] MRN: [id number] DOB/AGE: 1951/09/25 73 y.o.  Primary Care Physician:Hall, Norleen PEDLAR, MD Primary Cardiologist: Mallipeddi  CC:  Aortic valvular disease management     FOCUSED PROBLEM LIST:   Aortic stenosis AVA 1.1, MG 23, SVI 33 V-max 3.2, EF 40 to 45% TTE June 2025 EKG NSR with RBBB + LAFB Mitral stenosis MG 11.3, severe MAC, EF 40 to 45% TTE June 2025 CAD s/p CABG LIMA to LAD, left radial to ramus, and vein graft to RCA 2014 T2DM Not on insulin  Hyperlipidemia Aortic atherosclerosis Chest x-ray 2024 Invasive ductal breast carcinoma Status post Tamoxifen  and R mastectomy 2012 Hodgkin's lymphoma Status post mantle XRT 1983 CKD stage IIIa Carotid disease Right distal ICA occlusion versus obstruction, left ICA 1 to 39% Dopplers 2025 Asthma BMI 26.8/BSA 2.10 May 2024:  Patient consents to use of AI scribe. The patient is a 73 year old male with the above listed complex medical history who is referred for recommendations regarding the patient's aortic and mitral valvular disease.  The patient was seen by his general cardiologist in June.  At that point in time he endorsed exertional chest discomfort as well as exertional dyspnea.  He also noted some dizziness as well.  An echocardiogram demonstrated low-flow low gradient aortic stenosis with a severely calcified aortic valve with restricted motion.  The mitral valve was also quite diseased with a mean gradient of 11 mmHg and severe posterior MAC.  Over the past few years, he has experienced worsening fatigue and shortness of breath, which have become more pronounced in the last year. Despite using a Symbicort  inhaler for bronchial asthma, he continues to experience significant fatigue and shortness of breath with minimal exertion, such as walking to the bathroom. He has not experienced chest pain since his heart surgery but reports increased lethargy and difficulty sleeping, often sleeping in a recliner  due to breathing difficulties when lying flat.  He has a known heart murmur since his heart surgery and has been informed of two valve issues. He feels more fatigued and less active over the past couple of years. He has also noticed increased lethargy and difficulty sleeping, often sleeping in a recliner due to breathing difficulties when lying flat.  He experiences lightheadedness when transitioning from sitting to standing and has had episodes of dizziness. He also experiences neuropathy in his legs, affecting his balance and causing him to be off-balance at times.   He takes Lasix , which has helped with edema and swelling in his legs, improving his breathing and reducing wheezing.  His social history includes a past of being more active, enjoying outdoor activities like fishing, but he has become more sedentary due to his health issues. He occasionally goes grocery shopping but finds it a nerve-wracking experience. He has noticed a decrease in his physical activity and interest in activities over the past few years.    He has both upper and lower dentures.   Past Medical History:  Diagnosis Date   Adie's pupil    Anemia 07/14/2013   Borderline diabetes    CAD (coronary artery disease), native coronary artery 04/09/2013   Dist LM stenosis 70-80% (at trifurcation into LAD, CX, & 2 Ramus branches, 70% mid RCA);; b) Myoview  08/06/14: Low Risk, no Ischemia/infarction   Depression 1983   after I was dx'd w/CA; now just comes in spells  (12/22/2015)   Essential hypertension    Exertional shortness of breath    Hodgkin's lymphoma (HCC) 1983   Hypercholesterolemia  Hypothyroidism    Invasive ductal carcinoma of breast, stage 2 (HCC) 04/27/2011   chemo, mastectomy   Left carotid artery partial occlusion 04/27/2011   CAROTIC DOPPLER 11/2013: < 40% R ICA stenosis, distal waveforms damped - suggests probably intracranial occlusoin, < 40% LICA, normal vertebrals -- no change   Mild aortic  stenosis by prior echocardiogram 02/2018   By echo June 2020: Mild-Mod AS (Mean Gradient 13.5 mmHg)   Mild mitral stenosis by prior echocardiogram 08/06/2014   Most recent echo June 2020: Estimated valve area 2.83 cm (in 2019 was roughly 2 cm).  Mean gradient 6-8 mmHg.  Appears to be stable.   Personal history of chemotherapy    Pre-diabetes    Restless leg syndrome    Right carotid artery occlusion 04/27/2011   S/P CABG x 3 04/09/2013   LIMA-LAD, fLRAD-RI, SVG-RCA; Echo 10/29/'15:  mild Conc LVH, no WMA, Gr 1 DD, Mod AoV Sclerosis w/ Mod AI, ~Mild-Mod MS    Sleep apnea    Stroke [facility])    2007   Thyroid  disease     Past Surgical History:  Procedure Laterality Date   BIOPSY  09/08/2020   Procedure: BIOPSY;  Surgeon: Golda Claudis PENNER, MD;  [facility];  Service: Endoscopy;;  colon   BIOPSY  12/09/2021   Procedure: BIOPSY;  Surgeon: Eartha Angelia Sieving, MD;  [facility]: [facility];  Service: Gastroenterology;;   BREAST BIOPSY Right 2012   CARDIAC CATHETERIZATION  2014   CATARACT EXTRACTION W/PHACO Right 06/21/2015   Procedure: CATARACT EXTRACTION PHACO AND INTRAOCULAR LENS PLACEMENT (IOC);  Surgeon: Cherene Mania, MD;  Location: [facility];  Service: Ophthalmology;  Laterality: Right;  CDE: 7.05   CATARACT EXTRACTION W/PHACO Left 07/01/2015   Procedure: CATARACT EXTRACTION PHACO AND INTRAOCULAR LENS PLACEMENT (IOC);  Surgeon: Cherene Mania, MD;  Location: [facility];  Service: Ophthalmology;  Laterality: Left;  CDE: 7.49   COLONOSCOPY [name] 05/06/2015   Procedure: COLONOSCOPY;  Surgeon: Claudis PENNER Golda, MD;  [facility];  Service: Endoscopy;  Laterality: [name];  930   COLONOSCOPY WITH PROPOFOL  [name] 09/08/2020   Procedure: COLONOSCOPY WITH PROPOFOL ;  Surgeon: Golda Claudis PENNER, MD;  [facility];  Service: Endoscopy;  Laterality: [name];  915   CORONARY ARTERY BYPASS GRAFT [name] 04/08/2013   Procedure: CORONARY ARTERY BYPASS GRAFTING (CABG);  Surgeon: Maude Fleeta Ochoa,  MD;  Location: [facility] OR;  Service: Open Heart Surgery;  Laterality: [name];  Times 3 using left internal mammary artery; endoscopically harvested right saphenous vein and left radial artery   ESOPHAGOGASTRODUODENOSCOPY (EGD) WITH PROPOFOL  [name] 12/09/2021   Procedure: ESOPHAGOGASTRODUODENOSCOPY (EGD) WITH PROPOFOL ;  Surgeon: Eartha Angelia Sieving, MD;  [facility];  Service: Gastroenterology;  Laterality: [name];  1250 ASA 2   INTRAOPERATIVE TRANSESOPHAGEAL ECHOCARDIOGRAM [name] 04/08/2013   Procedure: INTRAOPERATIVE TRANSESOPHAGEAL ECHOCARDIOGRAM;  Surgeon: Maude Fleeta Ochoa, MD;  Location: [facility] OR;  Service: Open Heart Surgery;  Laterality: [name];   LAPAROSCOPIC APPENDECTOMY [name] 12/23/2015   Procedure: APPENDECTOMY LAPAROSCOPIC;  Surgeon: Donnice Lima, MD;  Location: [facility] OR;  Service: General;  Laterality: [name];   LEFT HEART CATHETERIZATION WITH CORONARY ANGIOGRAM [name] 04/07/2013   Procedure: LEFT HEART CATHETERIZATION WITH CORONARY ANGIOGRAM;  Surgeon: Alm LELON Clay, MD;  Location: [facility] CATH LAB;  Service: Cardiovascular;  Laterality: [name];   LYMPH NODE BIOPSY Left 1983   neck   MASTECTOMY Right 2012   w/axillary lymph node dissection   NM MYOVIEW  LTD  08/2019    EF 55  to 60%.  LOW RISK.  No ischemia or infarction noted.   NM TREADMILL MYOVIEW  LTD  08/06/2014   Exercise 5:07 min, 6.3 METS; symptoms noted or extreme dyspnea and lightheadedness with some chest tightness. No EKG changes. No ischemia or infarction was normal EF.   POLYPECTOMY  09/08/2020   Procedure: POLYPECTOMY;  Surgeon: Golda Claudis PENNER, MD;  [facility];  Service: Endoscopy;;   RADIAL ARTERY HARVEST Left 04/08/2013   Procedure: RADIAL ARTERY HARVEST;  Surgeon: Maude Fleeta Ochoa, MD;  Location: [facility] OR;  Service: Open Heart Surgery;  Laterality: Left;   SAVORY DILATION  12/09/2021   Procedure: SAVORY DILATION;  Surgeon: Eartha Angelia Sieving, MD;  [facility];  Service: Gastroenterology;;   TONSILLECTOMY   1974   TRANSTHORACIC ECHOCARDIOGRAM  08/06/2014; 02/2018   a) Nl EF 55-60%. Gr 1 DD-high LVEDP. Mod AoV Sclerosis w/o AS. Mild AI; mild MS - mean grad 7 mmHg @ HR 122 bpm.;; b) Initial: EF 55-60%, GRII DD.  HighLAP/LVEDP.  ? Bicuspid AoV -severely Ca2+.  Mild Ao root dilation.  Severe MAC, mild MS (MVA by P1/2 T & continuity equation ~2 cm).  Mod RV dilation w/ mild- mod reduced EF.---> Limited w/Defiinity: No RWMA, Mild AS, Mod MS (~mean gradient 8 mmHg   TRANSTHORACIC ECHOCARDIOGRAM  02/2020   Normal LV size and function.  EF 65 to 70% no R WMA.  Mild LVH with basal septum.  GRII DD with elevated LVEDP.  Moderate reduced RV function with mild RV dilation, but normal PA pressures.  Mild LA dilation.  Moderate mitral stenosis, moderate AS & AI.  Mild MR, Mod MS (mean gradient 9 mmHg).  Moderate calcified AS w/ Mod AI -> Mean gradient 17.3 mmHg with peak 32 mm.   TRANSTHORACIC ECHOCARDIOGRAM  03/2019    EF 60-65%, mild concentric LVH. Gr 2 DD. No RWMA. Mild-Mod RV dilation & enlargement. Mild LA dilation. Mod MV Calcification . Mod MS (previously read as mild, but mean gradient is lower than and estimated valve area is higher than last check).  Mild-Mod AS (Mean Gradient 13.5 mmHg)    Family History  Problem Relation Age of Onset   Alzheimer's disease Mother    Stroke Mother    Hypertension Mother    Hyperlipidemia Mother    Diabetes Mother    Hypertension Father    Diabetes Paternal Grandmother    Diabetes Paternal Grandfather    Breast cancer Paternal Aunt    Ovarian cancer Paternal Aunt     Social History   Socioeconomic History   Marital status: Married    Spouse name: Not on file   Number of children: 2   Years of education: Not on file   Highest education level: Not on file  Occupational History   Not on file  Tobacco Use   Smoking status: Former    Current packs/day: 0.00    Average packs/day: 1 pack/day for 15.0 years (15.0 ttl pk-yrs)    Types: Cigarettes    Start date:  06/10/1967    Quit date: 06/09/1982    Years since quitting: 41.9   Smokeless tobacco: Never  Vaping Use   Vaping status: Never Used  Substance and Sexual Activity   Alcohol use: No   Drug use: No   Sexual activity: Never  Other Topics Concern   Not on file  Social History Narrative   Married. Former smoker who quit in 1983.   No routine exercise - gained ~40lb post  CABG.   Social Drivers of Health   Financial Resource Strain: Not on file  Food Insecurity: Not on file  Transportation Needs: Not on file  Physical Activity: Not on file  Stress: Not on file  Social Connections: Not on file  Intimate Partner Violence: Not on file     Prior to Admission medications   Medication Sig Start Date End Date Taking? Authorizing Provider  acetaminophen  (TYLENOL ) 500 MG tablet Take 500 mg by mouth every 6 (six) hours as needed for mild pain.     [provider]  albuterol  (VENTOLIN  HFA) 108 (90 Base) MCG/ACT inhaler Inhale 2 puffs into the lungs as needed for wheezing or shortness of breath. 01/29/24   [name], MD  ALPRAZolam  (XANAX ) 1 MG tablet Take 0.5-1 mg by mouth at bedtime. Taking half in the morning and half at bedtime    [provider]  Artificial Tear Solution (SOOTHE XP OP) Place 1 drop into both eyes daily as needed (dry eyes).    [provider]  aspirin  EC 81 MG tablet Take 81 mg by mouth every evening. 09/09/20   Rehman, Claudis PENNER, MD  citalopram  (CELEXA ) 20 MG tablet Take 20 mg by mouth 2 (two) times daily. 11/28/23   [provider]  diphenhydrAMINE  (BENADRYL ) 25 MG tablet Take 25 mg by mouth daily as needed for allergies.    [provider]  docusate sodium  (COLACE) 100 MG capsule Take 100 mg by mouth daily.    [provider]  famotidine  (PEPCID ) 20 MG tablet One after supper 04/16/24   Wert, Michael B, MD  Ferrous Sulfate (IRON ) 28 MG TABS Take 28 mg by mouth daily.    [provider]  furosemide  (LASIX ) 20 MG  tablet Take 1 tablet (20 mg total) by mouth daily. 03/17/24   Mallipeddi, Vishnu P, MD  Ginkgo Biloba 60 MG CAPS Take 60 mg by mouth daily.    [provider]  ipratropium-albuterol  (DUONEB) 0.5-2.5 (3) MG/3ML SOLN Take 3 mLs by nebulization in the morning, at noon, in the evening, and at bedtime. 01/29/24   [name], MD  levothyroxine  (SYNTHROID ) 75 MCG tablet Take 75 mcg by mouth at bedtime. 10/31/23   [provider]  Magnesium  500 MG TABS Take 500 mg by mouth 2 (two) times a week.    [provider]  Menthol-Methyl Salicylate (MUSCLE RUB) 10-15 % CREA Apply 1 application topically as needed for muscle pain.    [provider]  nitroGLYCERIN  (NITROSTAT ) 0.4 MG SL tablet Place 1 tablet (0.4 mg total) under the tongue every 5 (five) minutes as needed for chest pain. 12/04/19   [name], MD  OVER THE COUNTER MEDICATION Take 2 tablets by mouth daily. Ultra kidney complex    [provider]  OVER THE COUNTER MEDICATION Take 1 tablet by mouth daily. Advanced memory complex    [provider]  OVER THE COUNTER MEDICATION Take 1 tablet by mouth daily as needed (constipation). Herbal laxative otc supplement    [provider]  pantoprazole  (PROTONIX ) 40 MG tablet Take 1 tablet (40 mg total) by mouth daily. Take 30-60 min before first meal of the day 04/16/24   [name], MD  pramipexole (MIRAPEX) 0.5 MG tablet Take 0.5 mg by mouth daily. 12/02/23   [provider]  simvastatin  (ZOCOR ) 80 MG tablet Take 80 mg by mouth every evening.  10/13/19   [provider]  SYMBICORT  80-4.5 MCG/ACT inhaler  Inhale 2 puffs into the lungs 2 (two) times daily. 02/21/24   [provider]  TURMERIC PO Take 454 mg by mouth daily.    [provider]  vitamin B-12 (CYANOCOBALAMIN ) 500 MCG tablet Take 500 mcg by mouth daily.    [provider]    No Known Allergies  REVIEW OF SYSTEMS:  General: no  fevers/chills/night sweats Eyes: no blurry vision, diplopia, or amaurosis ENT: no sore throat or hearing loss Resp: no cough, wheezing, or hemoptysis CV: no edema or palpitations GI: no abdominal pain, nausea, vomiting, diarrhea, or constipation GU: no dysuria, frequency, or hematuria Skin: no rash Neuro: no headache, numbness, tingling, or weakness of extremities Musculoskeletal: no joint pain or swelling Heme: no bleeding, DVT, or easy bruising Endo: no polydipsia or polyuria  BP (!) 172/68   Pulse 80   Ht 6' 3 (1.905 m)   Wt 202 lb 3.2 oz (91.7 kg)   SpO2 96%   BMI 25.27 kg/m   PHYSICAL EXAM: GEN:  AO x 3 in no acute distress HEENT: normal Dentition: Upper and lower dentures Neck: JVP normal. +1 carotid upstrokes without bruits. No thyromegaly. Lungs: equal expansion, clear bilaterally CV: Apex is discrete and nondisplaced, RRR with 2 out of 6 systolic ejection murmur Abd: soft, non-tender, non-distended; no bruit; positive bowel sounds Ext: no edema, ecchymoses, or cyanosis Vascular: 2+ femoral pulses, 2+ radial pulses       Skin: warm and dry without rash Neuro: CN II-XII grossly intact; motor and sensory grossly intact    DATA AND STUDIES:  EKG:   EKG Interpretation Date/Time:  Monday May 12 2024 09:09:32 EDT Ventricular Rate:  80 PR Interval:  264 QRS Duration:  162 QT Interval:  434 QTC Calculation: 500 R Axis:   -70  Text Interpretation: Sinus rhythm with 1st degree A-V block Right bundle branch block Left anterior fascicular block Bifascicular block Minimal voltage criteria for LVH, may be normal variant ( R in aVL ) When compared with ECG of 24-Mar-2024 08:02, Previous ECG has undetermined rhythm, needs review Confirmed by Wendel Haws ([id number]) on 05/12/2024 9:13:36 AM        CARDIAC STUDIES: Refer to CV Procedures and Imaging Tabs  12/19/2023: BNP 193.1; TSH 5.320 02/05/2024: ALT 22; BUN 17; Creatinine, Ser 1.36; Hemoglobin 13.6; Platelets 249;  Potassium 4.5; Sodium 138   STS RISK CALCULATOR: Pending  NHYA CLASS: 2    ASSESSMENT AND PLAN:   1. Nonrheumatic aortic valve stenosis   2. Nonrheumatic mitral valve stenosis   3. S/P CABG x 3  LIMA-LAD, free LRA to RI, SVG-RCA  - 04/09/13   4. Type 2 diabetes mellitus without complication, without long-term current use of insulin  (HCC)   5. Hyperlipidemia associated with type 2 diabetes mellitus (HCC)   6. Aortic atherosclerosis (HCC)   7. Infiltrating ductal carcinoma of breast, stage 2, unspecified laterality (HCC)   8. Hodgkin lymphoma, unspecified Hodgkin lymphoma type, unspecified body region (HCC)   9. Stage 3a chronic kidney disease (HCC)   10. Bilateral carotid artery stenosis   11. BMI 26.0-26.9,adult   12. Right bundle branch block (RBBB) with left anterior fascicular block (LAFB)   13. Elevated blood pressure reading in office without diagnosis of hypertension     AS:  The patient has a highly calcified aortic valve with indices consistent with low-flow low gradient aortic stenosis with a stroke-volume index of less than 35 cc/m.  He has multiple etiologies contributing to his shortness of breath  and fatigue including asthma, a dysfunctional left hemidiaphragm, and severe valvular disease.  I had a long conversation with the patient and his family.  Given his symptoms of lightheadedness, fatigue And shortness of breath in conjunction with his aortic valvular disease we will pursue an evaluation.  I will refer him for coronary angiography, right heart catheterization, TAVR protocol CTA, and a cardiothoracic surgical evaluation.  Further recommendations will be informed by the results of this testing. MS: The patient has a highly calcified mitral valve with at least posterior MAC seen on his most recent echocardiogram.  We will review his TAVR protocol CTA for his pattern of MAC and neo-LVOT.  Given his comorbidities I do not think he is a candidate for a double valve surgery.   Will consider isolated aortic valve intervention with a TAVR if not deemed a surgical candidate and then decide about a mitral valve intervention in the future depending on symptoms. CABG: Continue aspirin  81 mg, simvastatin  80 mg, as needed nitroglycerin  T2DM: Continue aspirin  81 mg, simvastatin  80 mg, consider ARB and SGLT2 inhibitor in the future. HL: Continue simvastatin  80 mg. Aortic atherosclerosis: Continue aspirin  81 mg, simvastatin  80 mg. Ductal breast carcinoma:  In remission, followed by oncology. Hodgkin's lymphomoa:  S/p mantle radiation and likely the etiology of his aortic and mitral disease.  Likely not a candidate for surgical valve replacement.  Followed by oncology. CKD stage IIIa: Consider ARB and SGLT2 inhibitor in the future. Carotid disease: Either high-grade obstruction or complete occlusion of left distal ICA; if TAVR is pursued embolic protection may not be possible if it were to be considered.  Elevated BMI: The patient's BSA is over 2.  Therefore a larger valve area if a TAVR is pursued would be optimal. Bifascicular block: The patient is at higher risk for heart block.  We will review his membranous septum.  If a TAVR is pursued we will pursue right IJ pacing.  Repeat EKG today demonstrated sinus rhythm with bifascicular block and no atrial fibrillation. Elevated blood pressure: Given significant valvular disease would monitor for now and address after valve intervention.   I have personally reviewed the patients imaging data as summarized above.  I have reviewed the natural history of aortic stenosis with the patient and family members who are present today. We have discussed the limitations of medical therapy and the poor prognosis associated with symptomatic aortic stenosis. We have also reviewed potential treatment options, including palliative medical therapy, conventional surgical aortic valve replacement, and transcatheter aortic valve replacement. We discussed  treatment options in the context of this patient's specific comorbid medical conditions.   All of the patient's questions were answered today. Will make further recommendations based on the results of studies outlined above.   I spent 51 minutes reviewing all clinical data during and prior to this visit including all relevant imaging studies, laboratories, clinical information from other health systems and prior notes from both Cardiology and other specialties, interviewing the patient, conducting a complete physical examination, and coordinating care in order to formulate a comprehensive and personalized evaluation and treatment plan.   Ryan Dara K Arabia Nylund, MD  05/12/2024 10:27 AM    [facility] [address] [place], [place], [place]  [address] Phone: [phone]; Fax: [phone]

## 2024-05-12 ENCOUNTER — Encounter: Payer: Self-pay | Admitting: Internal Medicine

## 2024-05-12 ENCOUNTER — Ambulatory Visit: Attending: Internal Medicine | Admitting: Internal Medicine

## 2024-05-12 VITALS — BP 172/68 | HR 80 | Ht 75.0 in | Wt 202.2 lb

## 2024-05-12 DIAGNOSIS — C50919 Malignant neoplasm of unspecified site of unspecified female breast: Secondary | ICD-10-CM | POA: Diagnosis not present

## 2024-05-12 DIAGNOSIS — R030 Elevated blood-pressure reading, without diagnosis of hypertension: Secondary | ICD-10-CM | POA: Insufficient documentation

## 2024-05-12 DIAGNOSIS — I350 Nonrheumatic aortic (valve) stenosis: Secondary | ICD-10-CM | POA: Insufficient documentation

## 2024-05-12 DIAGNOSIS — I700 Atherosclerosis of aorta: Secondary | ICD-10-CM | POA: Insufficient documentation

## 2024-05-12 DIAGNOSIS — I452 Bifascicular block: Secondary | ICD-10-CM | POA: Insufficient documentation

## 2024-05-12 DIAGNOSIS — Z6826 Body mass index (BMI) 26.0-26.9, adult: Secondary | ICD-10-CM | POA: Insufficient documentation

## 2024-05-12 DIAGNOSIS — E785 Hyperlipidemia, unspecified: Secondary | ICD-10-CM | POA: Diagnosis not present

## 2024-05-12 DIAGNOSIS — Z951 Presence of aortocoronary bypass graft: Secondary | ICD-10-CM | POA: Insufficient documentation

## 2024-05-12 DIAGNOSIS — E119 Type 2 diabetes mellitus without complications: Secondary | ICD-10-CM | POA: Insufficient documentation

## 2024-05-12 DIAGNOSIS — I6523 Occlusion and stenosis of bilateral carotid arteries: Secondary | ICD-10-CM | POA: Diagnosis not present

## 2024-05-12 DIAGNOSIS — I342 Nonrheumatic mitral (valve) stenosis: Secondary | ICD-10-CM | POA: Insufficient documentation

## 2024-05-12 DIAGNOSIS — E1169 Type 2 diabetes mellitus with other specified complication: Secondary | ICD-10-CM | POA: Diagnosis not present

## 2024-05-12 DIAGNOSIS — N1831 Chronic kidney disease, stage 3a: Secondary | ICD-10-CM | POA: Diagnosis not present

## 2024-05-12 DIAGNOSIS — C8190 Hodgkin lymphoma, unspecified, unspecified site: Secondary | ICD-10-CM | POA: Insufficient documentation

## 2024-05-12 NOTE — Patient Instructions (Addendum)
 Medication Instructions:  Your physician recommends that you continue on your current medications as directed. Please refer to the Current Medication list given to you today. *If you need a refill on your cardiac medications before your next appointment, please call your pharmacy*  Lab Work: TODAY-BMET & CBC If you have labs (blood work) drawn today and your tests are completely normal, you will receive your results only by: MyChart Message (if you have MyChart) OR A paper copy in the mail If you have any lab test that is abnormal or we need to change your treatment, we will call you to review the results.  Testing/Procedures: Your physician has requested that you have a cardiac catheterization. Cardiac catheterization is used to diagnose and/or treat various heart conditions. Doctors may recommend this procedure for a number of different reasons. The most common reason is to evaluate chest pain. Chest pain can be a symptom of coronary artery disease (CAD), and cardiac catheterization can show whether plaque is narrowing or blocking your heart's arteries. This procedure is also used to evaluate the valves, as well as measure the blood flow and oxygen levels in different parts of your heart. For further information please visit [email or web address] Please follow instruction sheet, as given. Scheduled for August 12  Follow-Up: At [facility], you and your health needs are our priority.  As part of our continuing mission to provide you with exceptional heart care, our providers are all part of one team.  This team includes your primary Cardiologist (physician) and Advanced Practice Providers or APPs (Physician Assistants and Nurse Practitioners) who all work together to provide you with the care you need, when you need it.  Your next appointment:   FOLLOW UP AS SCHEDULED   Provider:   You may see Vishnu P Mallipeddi, MD or one of the following Advanced Practice Providers on your  designated Care Team:   [place], PA-C  Scotesia Wing, [place] Olivia Pavy, [place]    We recommend signing up for the patient portal called MyChart.  Sign up information is provided on this After Visit Summary.  MyChart is used to connect with patients for Virtual Visits (Telemedicine).  Patients are able to view lab/test results, encounter notes, upcoming appointments, etc.  Non-urgent messages can be sent to your provider as well.   To learn more about what you can do with MyChart, go to [email or web address].   Other Instructions  [place] A DEPT OF [place] AT [address] A DEPT OF THE [place] [address] [place] [address] Dept: [phone] Loc: [phone]  SHARAD VANEATON  05/12/2024  You are scheduled for a Cardiac Catheterization on Tuesday, August 12 with Dr. Lurena Red.  1. Please arrive at the [facility] (Main Entrance A) at [facility]: [address] [place], [place] [address] at 8:30 AM (This time is 2 hour(s) before your procedure to ensure your preparation).   Free valet parking service is available. You will check in at ADMITTING. The support person will be asked to wait in the waiting room.  It is OK to have someone drop you off and come back when you are ready to be discharged.    Special note: Every effort is made to have your procedure done on time. Please understand that emergencies sometimes delay scheduled procedures.  2. Diet: No solid foods after midnight. You may have clear liquids until you arrive at the hospital.  List  of approved liquids water , clear juice, clear tea, black coffee, fruit juices, non-citric and without pulp, carbonated beverages, Gatorade, Kool -Aid, plain Jello-O and plain ice popsicles.   3. Hydration: You need to be well hydrated before your procedure time. You may drink approved liquids (see below) until you arrive at the hospital. On the way to  the hospital, please drink a 16-oz (1 plastic bottle) of water .   List of approved liquids water , clear juice, clear tea, black coffee, fruit juices, non-citric and without pulp, carbonated beverages, Gatorade, Kool -Aid, plain Jello-O and plain ice popsicles.   4. Labs: You will need to have blood drawn today  --August 4  5. Medication instructions in preparation for your procedure: Do not take furosemide  the morning of the procedure   Contrast Allergy: No    On the morning of your procedure, take your Aspirin  81 mg and any morning medicines NOT listed above.  You may use sips of water .  6. Plan to go home the same day, you will only stay overnight if medically necessary. 7. Bring a current list of your medications and current insurance cards. 8. You MUST have a responsible person to drive you home. 9. Someone MUST be with you the first 24 hours after you arrive home or your discharge will be delayed. 10. Please wear clothes that are easy to get on and off and wear slip-on shoes.  Thank you for allowing us  to care for you!   -- [facility]

## 2024-05-12 NOTE — Progress Notes (Signed)
 Pre Surgical Assessment: 5 M Walk Test  91M=16.45ft  5 Meter Walk Test- trial 1: 5.57 seconds 5 Meter Walk Test- trial 2: 5.63 seconds 5 Meter Walk Test- trial 3: 5.48 seconds 5 Meter Walk Test Average: 5.56 seconds

## 2024-05-13 ENCOUNTER — Ambulatory Visit: Payer: Self-pay | Admitting: Internal Medicine

## 2024-05-13 LAB — BASIC METABOLIC PANEL WITH GFR
BUN/Creatinine Ratio: 16 (ref 10–24)
BUN: 22 mg/dL (ref 8–27)
CO2: 25 mmol/L (ref 20–29)
Calcium: 9.5 mg/dL (ref 8.6–10.2)
Chloride: 101 mmol/L (ref 96–106)
Creatinine, Ser: 1.41 mg/dL — AB (ref 0.76–1.27)
Glucose: 93 mg/dL (ref 70–99)
Potassium: 5.3 mmol/L — AB (ref 3.5–5.2)
Sodium: 139 mmol/L (ref 134–144)
eGFR: 52 mL/min/1.73 — AB (ref >59)

## 2024-05-13 LAB — CBC
Hematocrit: 42 % (ref 37.5–51.0)
Hemoglobin: 13.3 g/dL (ref 13.0–17.7)
MCH: 28.2 pg (ref 26.6–33.0)
MCHC: 31.7 g/dL (ref 31.5–35.7)
MCV: 89 fL (ref 79–97)
Platelets: 265 x10E3/uL (ref 150–450)
RBC: 4.71 x10E6/uL (ref 4.14–5.80)
RDW: 13.9 % (ref 11.6–15.4)
WBC: 7.4 x10E3/uL (ref 3.4–10.8)

## 2024-05-19 ENCOUNTER — Telehealth: Payer: Self-pay | Admitting: *Deleted

## 2024-05-19 NOTE — Telephone Encounter (Signed)
 Cardiac Catheterization scheduled at [facility] for: Tuesday May 20, 2024 10:30 AM Arrival time [facility] Main Entrance A at: 8:30 AM  Diet: -Nothing to eat after midnight prior to procedure.  Hydration: -May drink clear liquids until leaving for hospital. Approved liquids: Water , clear tea, black coffee, fruit juices-non-citric and without pulp,Gatorade, plain Jello/popsicles.  Drink 16 oz. bottle of water  on the way to the hospital.  Medication instructions: -Hold:  Lasix -day before and day of procedure-per protocol GFR <60 (52)-pt has already taken today. -Other usual morning medications can be taken including aspirin  81 mg.  Plan to go home the same day, you will only stay overnight if medically necessary.  You must have responsible adult to drive you home.  Someone must be with you the first 24 hours after you arrive home.  Reviewed procedure instructions/avoiding foods high in potassium/staying well-hydrated with patient's wife (DPR).  Patient's wife tells me patient is not currently taking potassium supplement.

## 2024-05-20 ENCOUNTER — Other Ambulatory Visit: Payer: Self-pay

## 2024-05-20 ENCOUNTER — Encounter (HOSPITAL_COMMUNITY): Payer: Self-pay | Admitting: Internal Medicine

## 2024-05-20 ENCOUNTER — Other Ambulatory Visit: Payer: Self-pay | Admitting: Cardiology

## 2024-05-20 ENCOUNTER — Encounter (HOSPITAL_COMMUNITY)
Admission: RE | Disposition: A | Discharge status: Home / Self Care | Payer: Self-pay | Source: Home / Self Care | Attending: Internal Medicine

## 2024-05-20 ENCOUNTER — Ambulatory Visit (HOSPITAL_COMMUNITY)
Admission: RE | Admit: 2024-05-20 | Discharge: 2024-05-20 | Disposition: A | Discharge status: Home / Self Care | Attending: Internal Medicine | Admitting: Internal Medicine

## 2024-05-20 DIAGNOSIS — Z87891 Personal history of nicotine dependence: Secondary | ICD-10-CM | POA: Insufficient documentation

## 2024-05-20 DIAGNOSIS — I10 Essential (primary) hypertension: Secondary | ICD-10-CM

## 2024-05-20 DIAGNOSIS — Z951 Presence of aortocoronary bypass graft: Secondary | ICD-10-CM | POA: Diagnosis not present

## 2024-05-20 DIAGNOSIS — Z79899 Other long term (current) drug therapy: Secondary | ICD-10-CM | POA: Insufficient documentation

## 2024-05-20 DIAGNOSIS — I452 Bifascicular block: Secondary | ICD-10-CM | POA: Insufficient documentation

## 2024-05-20 DIAGNOSIS — I2510 Atherosclerotic heart disease of native coronary artery without angina pectoris: Secondary | ICD-10-CM

## 2024-05-20 DIAGNOSIS — Z7982 Long term (current) use of aspirin: Secondary | ICD-10-CM | POA: Diagnosis not present

## 2024-05-20 DIAGNOSIS — I350 Nonrheumatic aortic (valve) stenosis: Secondary | ICD-10-CM | POA: Diagnosis not present

## 2024-05-20 DIAGNOSIS — N1831 Chronic kidney disease, stage 3a: Secondary | ICD-10-CM | POA: Insufficient documentation

## 2024-05-20 DIAGNOSIS — E1122 Type 2 diabetes mellitus with diabetic chronic kidney disease: Secondary | ICD-10-CM | POA: Insufficient documentation

## 2024-05-20 DIAGNOSIS — C8190 Hodgkin lymphoma, unspecified, unspecified site: Secondary | ICD-10-CM | POA: Diagnosis not present

## 2024-05-20 DIAGNOSIS — I700 Atherosclerosis of aorta: Secondary | ICD-10-CM | POA: Insufficient documentation

## 2024-05-20 DIAGNOSIS — I6523 Occlusion and stenosis of bilateral carotid arteries: Secondary | ICD-10-CM | POA: Insufficient documentation

## 2024-05-20 DIAGNOSIS — I080 Rheumatic disorders of both mitral and aortic valves: Secondary | ICD-10-CM | POA: Diagnosis not present

## 2024-05-20 DIAGNOSIS — R030 Elevated blood-pressure reading, without diagnosis of hypertension: Secondary | ICD-10-CM | POA: Diagnosis not present

## 2024-05-20 HISTORY — PX: RIGHT HEART CATH AND CORONARY ANGIOGRAPHY: CATH118264

## 2024-05-20 LAB — POCT I-STAT EG7
Acid-Base Excess: 2 mmol/L (ref 0.0–2.0)
Acid-Base Excess: 2 mmol/L (ref 0.0–2.0)
Acid-Base Excess: 4 mmol/L — ABNORMAL HIGH (ref 0.0–2.0)
Bicarbonate: 28.2 mmol/L — ABNORMAL HIGH (ref 20.0–28.0)
Bicarbonate: 28.7 mmol/L — ABNORMAL HIGH (ref 20.0–28.0)
Bicarbonate: 31 mmol/L — ABNORMAL HIGH (ref 20.0–28.0)
Calcium, Ion: 1.21 mmol/L (ref 1.15–1.40)
Calcium, Ion: 1.18 mmol/L (ref 1.15–1.40)
Calcium, Ion: 1.52 mmol/L (ref 1.15–1.40)
HCT: 39 % (ref 39.0–52.0)
HCT: 38 % — ABNORMAL LOW (ref 39.0–52.0)
HCT: 39 % (ref 39.0–52.0)
Hemoglobin: 13.3 g/dL (ref 13.0–17.0)
Hemoglobin: 12.9 g/dL — ABNORMAL LOW (ref 13.0–17.0)
Hemoglobin: 13.3 g/dL (ref 13.0–17.0)
O2 Saturation: 57 %
O2 Saturation: 55 %
O2 Saturation: 59 %
Potassium: 4.3 mmol/L (ref 3.5–5.1)
Potassium: 4.2 mmol/L (ref 3.5–5.1)
Potassium: 4.3 mmol/L (ref 3.5–5.1)
Sodium: 138 mmol/L (ref 135–145)
Sodium: 137 mmol/L (ref 135–145)
Sodium: 139 mmol/L (ref 135–145)
TCO2: 30 mmol/L (ref 22–32)
TCO2: 30 mmol/L (ref 22–32)
TCO2: 33 mmol/L — ABNORMAL HIGH (ref 22–32)
pCO2, Ven: 51.9 mmHg (ref 44–60)
pCO2, Ven: 53.1 mmHg (ref 44–60)
pCO2, Ven: 54.6 mmHg (ref 44–60)
pH, Ven: 7.344 (ref 7.25–7.43)
pH, Ven: 7.341 (ref 7.25–7.43)
pH, Ven: 7.362 (ref 7.25–7.43)
pO2, Ven: 32 mmHg (ref 32–45)
pO2, Ven: 31 mmHg — CL (ref 32–45)
pO2, Ven: 33 mmHg (ref 32–45)

## 2024-05-20 LAB — POCT I-STAT 7, (LYTES, BLD GAS, ICA,H+H)
Acid-Base Excess: 1 mmol/L (ref 0.0–2.0)
Bicarbonate: 26.6 mmol/L (ref 20.0–28.0)
Calcium, Ion: 1.13 mmol/L — ABNORMAL LOW (ref 1.15–1.40)
HCT: 38 % — ABNORMAL LOW (ref 39.0–52.0)
Hemoglobin: 12.9 g/dL — ABNORMAL LOW (ref 13.0–17.0)
O2 Saturation: 91 %
Potassium: 4 mmol/L (ref 3.5–5.1)
Sodium: 141 mmol/L (ref 135–145)
TCO2: 28 mmol/L (ref 22–32)
pCO2 arterial: 46.6 mmHg (ref 32–48)
pH, Arterial: 7.365 (ref 7.35–7.45)
pO2, Arterial: 63 mmHg — ABNORMAL LOW (ref 83–108)

## 2024-05-20 LAB — POTASSIUM: Potassium: 4.1 mmol/L (ref 3.5–5.1)

## 2024-05-20 SURGERY — RIGHT HEART CATH AND CORONARY ANGIOGRAPHY
Anesthesia: LOCAL

## 2024-05-20 MED ORDER — SODIUM CHLORIDE 0.9 % IV SOLN: 250.0000 mL | INTRAVENOUS | Status: DC | PRN

## 2024-05-20 MED ORDER — MIDAZOLAM HCL 2 MG/2ML IJ SOLN
INTRAMUSCULAR | Status: DC | PRN
  Administered 2024-05-20 (×2): 1 mg via INTRAVENOUS

## 2024-05-20 MED ORDER — FENTANYL CITRATE (PF) 100 MCG/2ML IJ SOLN
INTRAMUSCULAR | Status: DC | PRN
  Administered 2024-05-20 (×2): 25 ug via INTRAVENOUS

## 2024-05-20 MED ORDER — HEPARIN (PORCINE) IN NACL 1000-0.9 UT/500ML-% IV SOLN
INTRAVENOUS | Status: DC | PRN
  Administered 2024-05-20 (×2): 1000 mL

## 2024-05-20 MED ORDER — HYDRALAZINE HCL 20 MG/ML IJ SOLN
10.0000 mg | INTRAMUSCULAR | Status: AC | PRN
Start: 2024-05-20 — End: 2024-05-20

## 2024-05-20 MED ORDER — FUROSEMIDE 20 MG PO TABS: 20.0000 mg | ORAL_TABLET | Freq: Two times a day (BID) | ORAL | 5 refills | Status: DC

## 2024-05-20 MED ORDER — LIDOCAINE HCL (PF) 1 % IJ SOLN
INTRAMUSCULAR | Status: AC
  Filled 2024-05-20: qty 30

## 2024-05-20 MED ORDER — LIDOCAINE HCL (PF) 1 % IJ SOLN
INTRAMUSCULAR | Status: DC | PRN
  Administered 2024-05-20 (×2): 5 mL

## 2024-05-20 MED ORDER — FREE WATER
500.0000 mL | Freq: Once | Status: AC
  Administered 2024-05-20 (×2): 500 mL via ORAL

## 2024-05-20 MED ORDER — SODIUM CHLORIDE 0.9% FLUSH
3.0000 mL | INTRAVENOUS | Status: DC | PRN
Start: 2024-05-20 — End: 2024-05-21

## 2024-05-20 MED ORDER — SODIUM CHLORIDE 0.9% FLUSH: 3.0000 mL | Freq: Two times a day (BID) | INTRAVENOUS | Status: DC

## 2024-05-20 MED ORDER — ACETAMINOPHEN 325 MG PO TABS: 650.0000 mg | ORAL_TABLET | ORAL | Status: DC | PRN

## 2024-05-20 MED ORDER — LABETALOL HCL 5 MG/ML IV SOLN: 10.0000 mg | INTRAVENOUS | Status: AC | PRN

## 2024-05-20 MED ORDER — MIDAZOLAM HCL 2 MG/2ML IJ SOLN
INTRAMUSCULAR | Status: AC
  Filled 2024-05-20: qty 2

## 2024-05-20 MED ORDER — IOHEXOL 350 MG/ML SOLN
INTRAVENOUS | Status: DC | PRN
  Administered 2024-05-20 (×2): 85 mL

## 2024-05-20 MED ORDER — FREE WATER: 500.0000 mL | Freq: Once | Status: DC

## 2024-05-20 MED ORDER — ONDANSETRON HCL 4 MG/2ML IJ SOLN: 4.0000 mg | Freq: Four times a day (QID) | INTRAMUSCULAR | Status: DC | PRN

## 2024-05-20 MED ORDER — ASPIRIN 81 MG PO CHEW: 81.0000 mg | CHEWABLE_TABLET | ORAL | Status: DC

## 2024-05-20 MED ORDER — SODIUM CHLORIDE 0.9% FLUSH: 3.0000 mL | INTRAVENOUS | Status: DC | PRN

## 2024-05-20 MED ORDER — FENTANYL CITRATE (PF) 100 MCG/2ML IJ SOLN
INTRAMUSCULAR | Status: AC
Start: 2024-05-20 — End: 2024-05-20
  Filled 2024-05-20: qty 2

## 2024-05-20 MED ORDER — METOPROLOL TARTRATE 50 MG PO TABS: ORAL_TABLET | ORAL | 0 refills | Status: DC

## 2024-05-20 SURGICAL SUPPLY — 12 items
CATH BALLN WEDGE 5F 110CM (CATHETERS) IMPLANT
CATH INFINITI 6F ANG MULTIPACK (CATHETERS) IMPLANT
GUIDEWIRE .025 260CM (WIRE) IMPLANT
PACK CARDIAC CATHETERIZATION (CUSTOM PROCEDURE TRAY) ×1 IMPLANT
SET ATX-X65L (MISCELLANEOUS) IMPLANT
SHEATH PINNACLE 5F 10CM (SHEATH) IMPLANT
SHEATH PINNACLE 6F 10CM (SHEATH) IMPLANT
SHEATH PROBE COVER 6X72 (BAG) IMPLANT
TUBING CIL FLEX 10 FLL-RA (TUBING) IMPLANT
WIRE EMERALD 3MM-J .035X260CM (WIRE) IMPLANT
WIRE MICRO SET 5FR 12 (WIRE) IMPLANT
WIRE MICRO SET SILHO 5FR 7 (SHEATH) IMPLANT

## 2024-05-20 NOTE — Progress Notes (Signed)
 40fr arterial sheath removed intact. Manual pressure applied to site. 18fr venous sheath removed 5 min post arterial removal. Manual pressure applied to sites x . 4x4 gauze and tegaderm dressing applied to sites. Post activity and precautions explained. VSS. Patient transported to procedural short stay for remaining bedrest and discharge home.Ryan [name] E

## 2024-05-20 NOTE — Interval H&P Note (Signed)
 History and Physical Interval Note:  05/20/2024 8:49 AM  Ryan [name]  has presented today for surgery, with the diagnosis of aortic stenosis.  The various methods of treatment have been discussed with the patient and family. After consideration of risks, benefits and other options for treatment, the patient has consented to  Procedure(s): RIGHT/LEFT HEART CATH AND CORONARY/GRAFT ANGIOGRAPHY ([name]) as a surgical intervention.  The patient's history has been reviewed, patient examined, no change in status, stable for surgery.  I have reviewed the patient's chart and labs.  Questions were answered to the patient's satisfaction.     Tyauna Lacaze K Laquasia Pincus

## 2024-05-27 DIAGNOSIS — I10 Essential (primary) hypertension: Secondary | ICD-10-CM | POA: Diagnosis not present

## 2024-05-27 LAB — BASIC METABOLIC PANEL WITH GFR
BUN/Creatinine Ratio: 11 (ref 10–24)
BUN: 19 mg/dL (ref 8–27)
CO2: 25 mmol/L (ref 20–29)
Calcium: 9.4 mg/dL (ref 8.6–10.2)
Chloride: 99 mmol/L (ref 96–106)
Creatinine, Ser: 1.69 mg/dL — ABNORMAL HIGH (ref 0.76–1.27)
Glucose: 80 mg/dL (ref 70–99)
Potassium: 4 mmol/L (ref 3.5–5.2)
Sodium: 141 mmol/L (ref 134–144)
eGFR: 42 mL/min/1.73 — ABNORMAL LOW (ref >59)

## 2024-05-28 ENCOUNTER — Ambulatory Visit: Payer: Self-pay | Admitting: Internal Medicine

## 2024-06-02 ENCOUNTER — Ambulatory Visit (HOSPITAL_COMMUNITY)
Admit: 2024-06-02 | Discharge: 2024-06-02 | Disposition: A | Discharge status: Home / Self Care | Source: Ambulatory Visit | Attending: Cardiology | Admitting: Cardiology

## 2024-06-02 ENCOUNTER — Ambulatory Visit: Payer: Self-pay | Admitting: Internal Medicine

## 2024-06-02 DIAGNOSIS — J929 Pleural plaque without asbestos: Secondary | ICD-10-CM | POA: Diagnosis not present

## 2024-06-02 DIAGNOSIS — Z951 Presence of aortocoronary bypass graft: Secondary | ICD-10-CM | POA: Diagnosis not present

## 2024-06-02 DIAGNOSIS — I350 Nonrheumatic aortic (valve) stenosis: Secondary | ICD-10-CM | POA: Diagnosis not present

## 2024-06-02 DIAGNOSIS — K5730 Diverticulosis of large intestine without perforation or abscess without bleeding: Secondary | ICD-10-CM | POA: Diagnosis not present

## 2024-06-02 DIAGNOSIS — I700 Atherosclerosis of aorta: Secondary | ICD-10-CM | POA: Diagnosis not present

## 2024-06-02 DIAGNOSIS — I2510 Atherosclerotic heart disease of native coronary artery without angina pectoris: Secondary | ICD-10-CM | POA: Diagnosis not present

## 2024-06-02 DIAGNOSIS — Z01810 Encounter for preprocedural cardiovascular examination: Secondary | ICD-10-CM | POA: Diagnosis not present

## 2024-06-02 DIAGNOSIS — Z01818 Encounter for other preprocedural examination: Secondary | ICD-10-CM | POA: Insufficient documentation

## 2024-06-02 MED ORDER — IOHEXOL 350 MG/ML SOLN
100.0000 mL | Freq: Once | INTRAVENOUS | Status: AC | PRN
  Administered 2024-06-02: 100 mL via INTRAVENOUS

## 2024-06-02 NOTE — Progress Notes (Signed)
 Procedure Type: Isolated AVR Perioperative Outcome Estimate % Operative Mortality 1.44% Morbidity & Mortality 9.86% Stroke 2.19% Renal Failure 1.63% Reoperation 3.12% Prolonged Ventilation 4.63% Deep Sternal Wound Infection 0.083% Long Hospital Stay (>14 days) 5.49% Short Hospital Stay (<6 days)*

## 2024-06-06 DIAGNOSIS — F419 Anxiety disorder, unspecified: Secondary | ICD-10-CM | POA: Diagnosis not present

## 2024-06-06 DIAGNOSIS — E039 Hypothyroidism, unspecified: Secondary | ICD-10-CM | POA: Diagnosis not present

## 2024-06-06 DIAGNOSIS — Z79899 Other long term (current) drug therapy: Secondary | ICD-10-CM | POA: Diagnosis not present

## 2024-06-06 DIAGNOSIS — F32A Depression, unspecified: Secondary | ICD-10-CM | POA: Diagnosis not present

## 2024-06-06 DIAGNOSIS — Z79890 Hormone replacement therapy (postmenopausal): Secondary | ICD-10-CM | POA: Diagnosis not present

## 2024-06-25 ENCOUNTER — Encounter: Payer: Self-pay | Admitting: Surgery

## 2024-06-25 ENCOUNTER — Ambulatory Visit: Attending: Surgery | Admitting: Surgery

## 2024-06-25 VITALS — BP 127/53 | HR 80 | Resp 18 | Ht 75.0 in | Wt 201.0 lb

## 2024-06-25 DIAGNOSIS — I350 Nonrheumatic aortic (valve) stenosis: Secondary | ICD-10-CM | POA: Diagnosis not present

## 2024-06-25 NOTE — Progress Notes (Unsigned)
 Patient ID: Ryan [name], male   DOB: [birth year], 73 y.o.   MRN: [id number]  [facility]       [address]       [place] [address]             [phone]          CARDIOTHORACIC SURGERY CONSULTATION REPORT  PCP is Shona Norleen PEDLAR, MD Referring Provider is Lurena Red, MD Primary Cardiologist is Vishnu SHAUNNA Maywood, MD  Reason for consultation:  Severe aortic stenosis  HPI:  The patient is a 73 year old gentleman with a history of hyperlipidemia, asthma, stage IIIa chronic kidney disease, Hodgkin's lymphoma status post mantle XRT in 1983, stage II infiltrating ductal breast carcinoma status post right mastectomy in 2012 and tamoxifen  therapy, coronary artery disease status post CABG x 3 by Dr. Obadiah in 2014 with a LIMA to the LAD, left radial graft to the ramus, and saphenous vein graft to the RCA, and severe aortic stenosis referred for consideration of TAVR.  Over the past 6 to 12 months he has developed worsening exertional shortness of breath and fatigue as well as some episodes of dizziness.  He was started on Symbicort  inhaler for possible bronchial asthma with some improvement in his breathing.  He denies any chest pain or pressure.  He has had lower extremity edema which is improved with Lasix .  A 2D echocardiogram on 04/03/2024 showed a thickened and calcified aortic valve with restricted leaflet mobility.  The mean gradient was 23 mmHg with a valve area of 1.09 cm.  There was moderate to severe eccentric aortic insufficiency with a pressure half-time of 285 ms.  Left ventricular ejection fraction was 40 to 45%.  There was severe mitral annular calcification with no MR but severe mitral valve stenosis with a mean gradient of 11.3 mmHg.  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  Past Medical History:   Diagnosis Date   Adie's pupil    Anemia 07/14/2013   Borderline diabetes    CAD (coronary artery disease), native coronary artery 04/09/2013   Dist LM stenosis 70-80% (at trifurcation into LAD, CX, & 2 Ramus branches, 70% mid RCA);; b) Myoview  08/06/14: Low Risk, no Ischemia/infarction   Depression 1983   after I was dx'd w/CA; now just comes in spells  (12/22/2015)   Essential hypertension    Exertional shortness of breath    Hodgkin's lymphoma (HCC) 1983   Hypercholesterolemia    Hypothyroidism    Invasive ductal carcinoma of breast, stage 2 (HCC) 04/27/2011   chemo, mastectomy   Left carotid artery partial occlusion 04/27/2011   CAROTIC DOPPLER 11/2013: < 40% R ICA stenosis, distal waveforms damped - suggests probably intracranial occlusoin, < 40% LICA, normal vertebrals -- no change   Mild aortic stenosis by prior echocardiogram 02/2018   By echo June 2020: Mild-Mod AS (Mean Gradient 13.5 mmHg)   Mild mitral stenosis by prior echocardiogram 08/06/2014   Most recent echo June 2020: Estimated valve area 2.83 cm (in 2019 was roughly 2 cm).  Mean gradient 6-8 mmHg.  Appears to be stable.   Personal history of chemotherapy    Pre-diabetes    Restless leg syndrome    Right carotid artery occlusion 04/27/2011   S/P CABG x 3 04/09/2013   LIMA-LAD, fLRAD-RI, SVG-RCA; Echo 10/29/'15:  mild Conc LVH, no WMA, Gr 1 DD,  Mod AoV Sclerosis w/ Mod AI, ~Mild-Mod MS    Sleep apnea    Stroke [facility])    2007   Thyroid  disease     Past Surgical History:  Procedure Laterality Date   BIOPSY  09/08/2020   Procedure: BIOPSY;  Surgeon: Golda Claudis PENNER, MD;  Location: [facility];  Service: Endoscopy;;  colon   BIOPSY  12/09/2021   Procedure: BIOPSY;  Surgeon: Eartha Angelia Sieving, MD;  Location: [facility];  Service: Gastroenterology;;   BREAST BIOPSY Right 2012   CARDIAC CATHETERIZATION  2014   CATARACT EXTRACTION W/PHACO Right 06/21/2015   Procedure: CATARACT EXTRACTION PHACO AND  INTRAOCULAR LENS PLACEMENT (IOC);  Surgeon: Cherene Mania, MD;  Location: [facility];  Service: Ophthalmology;  Laterality: Right;  CDE: 7.05   CATARACT EXTRACTION W/PHACO Left 07/01/2015   Procedure: CATARACT EXTRACTION PHACO AND INTRAOCULAR LENS PLACEMENT (IOC);  Surgeon: Cherene Mania, MD;  Location: [facility];  Service: Ophthalmology;  Laterality: Left;  CDE: 7.49   COLONOSCOPY [name] 05/06/2015   Procedure: COLONOSCOPY;  Surgeon: Claudis PENNER Golda, MD;  [facility];  Service: Endoscopy;  Laterality: [name];  930   COLONOSCOPY WITH PROPOFOL  [name] 09/08/2020   Procedure: COLONOSCOPY WITH PROPOFOL ;  Surgeon: Golda Claudis PENNER, MD;  [facility];  Service: Endoscopy;  Laterality: [name];  915   CORONARY ARTERY BYPASS GRAFT [name] 04/08/2013   Procedure: CORONARY ARTERY BYPASS GRAFTING (CABG);  Surgeon: Maude Fleeta Ochoa, MD;  Location: [facility] OR;  Service: Open Heart Surgery;  Laterality: [name];  Times 3 using left internal mammary artery; endoscopically harvested right saphenous vein and left radial artery   ESOPHAGOGASTRODUODENOSCOPY (EGD) WITH PROPOFOL  [name] 12/09/2021   Procedure: ESOPHAGOGASTRODUODENOSCOPY (EGD) WITH PROPOFOL ;  Surgeon: Eartha Angelia Sieving, MD;  [facility];  Service: Gastroenterology;  Laterality: [name];  1250 ASA 2   INTRAOPERATIVE TRANSESOPHAGEAL ECHOCARDIOGRAM [name] 04/08/2013   Procedure: INTRAOPERATIVE TRANSESOPHAGEAL ECHOCARDIOGRAM;  Surgeon: Maude Fleeta Ochoa, MD;  Location: [facility] OR;  Service: Open Heart Surgery;  Laterality: [name];   LAPAROSCOPIC APPENDECTOMY [name] 12/23/2015   Procedure: APPENDECTOMY LAPAROSCOPIC;  Surgeon: Donnice Lima, MD;  Location: [facility] OR;  Service: General;  Laterality: [name];   LEFT HEART CATHETERIZATION WITH CORONARY ANGIOGRAM [name] 04/07/2013   Procedure: LEFT HEART CATHETERIZATION WITH CORONARY ANGIOGRAM;  Surgeon: Alm LELON Clay, MD;  Location: [facility] CATH LAB;  Service: Cardiovascular;  Laterality: [name];   LYMPH NODE BIOPSY Left 1983   neck   MASTECTOMY  Right 2012   w/axillary lymph node dissection   NM MYOVIEW  LTD  08/2019    EF 55 to 60%.  LOW RISK.  No ischemia or infarction noted.   NM TREADMILL MYOVIEW  LTD  08/06/2014   Exercise 5:07 min, 6.3 METS; symptoms noted or extreme dyspnea and lightheadedness with some chest tightness. No EKG changes. No ischemia or infarction was normal EF.   POLYPECTOMY  09/08/2020   Procedure: POLYPECTOMY;  Surgeon: Golda Claudis PENNER, MD;  [facility];  Service: Endoscopy;;   RADIAL ARTERY HARVEST Left 04/08/2013   Procedure: RADIAL ARTERY HARVEST;  Surgeon: Maude Fleeta Ochoa, MD;  Location: [facility] OR;  Service: Open Heart Surgery;  Laterality: Left;   RIGHT HEART CATH AND CORONARY ANGIOGRAPHY [name] 05/20/2024   Procedure: RIGHT HEART CATH AND CORONARY ANGIOGRAPHY;  Surgeon: Wendel Lurena POUR, MD;  Location: [facility] INVASIVE CV LAB;  Service: Cardiovascular;  Laterality: [name];   SAVORY DILATION  12/09/2021   Procedure: SAVORY DILATION;  Surgeon: Eartha Angelia Sieving, MD;  [facility];  Service: Gastroenterology;;   TONSILLECTOMY  1974   TRANSTHORACIC ECHOCARDIOGRAM  08/06/2014; 02/2018   a) Nl EF 55-60%. Gr 1 DD-high LVEDP. Mod AoV Sclerosis w/o AS. Mild AI; mild MS - mean grad 7 mmHg @ HR 122 bpm.;; b) Initial: EF 55-60%, GRII DD.  HighLAP/LVEDP.  ? Bicuspid AoV -severely Ca2+.  Mild Ao root dilation.  Severe MAC, mild MS (MVA by P1/2 T & continuity equation ~2 cm).  Mod RV dilation w/ mild- mod reduced EF.---> Limited w/Defiinity: No RWMA, Mild AS, Mod MS (~mean gradient 8 mmHg   TRANSTHORACIC ECHOCARDIOGRAM  02/2020   Normal LV size and function.  EF 65 to 70% no R WMA.  Mild LVH with basal septum.  GRII DD with elevated LVEDP.  Moderate reduced RV function with mild RV dilation, but normal PA pressures.  Mild LA dilation.  Moderate mitral stenosis, moderate AS & AI.  Mild MR, Mod MS (mean gradient 9 mmHg).  Moderate calcified AS w/ Mod AI -> Mean gradient 17.3 mmHg with peak 32 mm.   TRANSTHORACIC  ECHOCARDIOGRAM  03/2019    EF 60-65%, mild concentric LVH. Gr 2 DD. No RWMA. Mild-Mod RV dilation & enlargement. Mild LA dilation. Mod MV Calcification . Mod MS (previously read as mild, but mean gradient is lower than and estimated valve area is higher than last check).  Mild-Mod AS (Mean Gradient 13.5 mmHg)    Family History  Problem Relation Age of Onset   Alzheimer's disease Mother    Stroke Mother    Hypertension Mother    Hyperlipidemia Mother    Diabetes Mother    Hypertension Father    Diabetes Paternal Grandmother    Diabetes Paternal Grandfather    Breast cancer Paternal Aunt    Ovarian cancer Paternal Aunt     Social History   Socioeconomic History   Marital status: Married    Spouse name: Not on file   Number of children: 2   Years of education: Not on file   Highest education level: Not on file  Occupational History   Not on file  Tobacco Use   Smoking status: Former    Current packs/day: 0.00    Average packs/day: 1 pack/day for 15.0 years (15.0 ttl pk-yrs)    Types: Cigarettes    Start date: 06/10/1967    Quit date: 06/09/1982    Years since quitting: 42.0   Smokeless tobacco: Never  Vaping Use   Vaping status: Never Used  Substance and Sexual Activity   Alcohol use: No   Drug use: No   Sexual activity: Never  Other Topics Concern   Not on file  Social History Narrative   Married. Former smoker who quit in 1983.   No routine exercise - gained ~40lb post CABG.   Social Drivers of Corporate investment banker Strain: Not on file  Food Insecurity: Not on file  Transportation Needs: Not on file  Physical Activity: Not on file  Stress: Not on file  Social Connections: Not on file  Intimate Partner Violence: Not on file    Prior to Admission medications   Medication Sig Start Date End Date Taking? Authorizing Provider  acetaminophen  (TYLENOL ) 500 MG tablet Take 500 mg by mouth every 6 (six) hours as needed for mild pain.    Yes [provider]  albuterol  (VENTOLIN  HFA) 108 (90 Base) MCG/ACT inhaler Inhale 2 puffs into the lungs as needed for wheezing or shortness of breath.  01/29/24  Yes [name], MD  ALPRAZolam  (XANAX ) 1 MG tablet Take 1 mg by mouth at bedtime.   Yes [provider]  Artificial Tear Solution (SOOTHE XP OP) Place 1 drop into both eyes daily as needed (dry eyes).   Yes [provider]  aspirin  EC 81 MG tablet Take 81 mg by mouth every evening. 09/09/20  Yes Rehman, Claudis PENNER, MD  b complex vitamins capsule Take 1 capsule by mouth daily.   Yes [provider]  busPIRone (BUSPAR) 5 MG tablet Take 5 mg by mouth daily. 05/06/24  Yes [provider]  Cholecalciferol (VITAMIN D3) 50 MCG (2000 UT) capsule Take 2,000 Units by mouth daily.   Yes [provider]  citalopram  (CELEXA ) 20 MG tablet Take 20 mg by mouth 2 (two) times daily. 11/28/23  Yes [provider]  docusate sodium  (COLACE) 100 MG capsule Take 100 mg by mouth daily as needed for moderate constipation.   Yes [provider]  famotidine  (PEPCID ) 20 MG tablet One after supper 04/16/24  Yes [name], MD  folic acid  (FOLVITE ) 400 MCG tablet Take 400 mcg by mouth daily.   Yes [provider]  furosemide  (LASIX ) 20 MG tablet Take 1 tablet (20 mg total) by mouth 2 (two) times daily. 05/20/24  Yes Thukkani, Arun K, MD  Homeopathic Products (RESTFUL LEGS SL) Place 1 tablet under the tongue 2 (two) times daily.   Yes [provider]  levothyroxine  (SYNTHROID ) 75 MCG tablet Take 75 mcg by mouth at bedtime. 10/31/23  Yes [provider]  Magnesium  500 MG TABS Take 500 mg by mouth 3 (three) times a week.   Yes [provider]  Menthol-Methyl Salicylate (MUSCLE RUB) 10-15 % CREA Apply 1 application topically as needed for muscle pain.   Yes [provider]  metoprolol  tartrate (LOPRESSOR ) 50 MG tablet Take one tablet by mouth as directed prior to CT scan 05/20/24  Yes  Thukkani, Arun K, MD  nitroGLYCERIN  (NITROSTAT ) 0.4 MG SL tablet Place 1 tablet (0.4 mg total) under the tongue every 5 (five) minutes as needed for chest pain. 12/04/19  Yes [name], MD  OVER THE COUNTER MEDICATION Take 2 tablets by mouth daily. Ultra kidney complex   Yes [provider]  OVER THE COUNTER MEDICATION Take 1 tablet by mouth daily as needed (constipation). Herbal laxative otc supplement   Yes [provider]  pantoprazole  (PROTONIX ) 40 MG tablet Take 1 tablet (40 mg total) by mouth daily. Take 30-60 min before first meal of the day 04/16/24  Yes [name], MD  pramipexole (MIRAPEX) 0.5 MG tablet Take 0.5 mg by mouth daily. 12/02/23  Yes [provider]  simvastatin  (ZOCOR ) 80 MG tablet Take 80 mg by mouth every evening.  10/13/19  Yes [provider]  SYMBICORT  80-4.5 MCG/ACT inhaler Inhale 2 puffs into the lungs 2 (two) times daily. 02/21/24  Yes [provider]  TURMERIC PO Take 800 mg by mouth daily.   Yes [provider]  Potassium 99 MG TABS Take 99 mg by mouth daily as needed (leg cramps). Patient not taking: Reported on 06/25/2024    [provider]    Current Outpatient Medications  Medication Sig Dispense Refill   acetaminophen  (TYLENOL ) 500 MG tablet Take 500 mg by mouth every 6 (six) hours as needed for mild pain.      albuterol  (VENTOLIN  HFA) 108 (90 Base) MCG/ACT inhaler Inhale 2 puffs into the  lungs as needed for wheezing or shortness of breath. 18 each 2   ALPRAZolam  (XANAX ) 1 MG tablet Take 1 mg by mouth at bedtime.     Artificial Tear Solution (SOOTHE XP OP) Place 1 drop into both eyes daily as needed (dry eyes).     aspirin  EC 81 MG tablet Take 81 mg by mouth every evening. 90 tablet 3   b complex vitamins capsule Take 1 capsule by mouth daily.     busPIRone (BUSPAR) 5 MG tablet Take 5 mg by mouth daily.     Cholecalciferol (VITAMIN D3) 50 MCG (2000 UT) capsule Take 2,000 Units by mouth daily.      citalopram  (CELEXA ) 20 MG tablet Take 20 mg by mouth 2 (two) times daily.     docusate sodium  (COLACE) 100 MG capsule Take 100 mg by mouth daily as needed for moderate constipation.     famotidine  (PEPCID ) 20 MG tablet One after supper 30 tablet 11   folic acid  (FOLVITE ) 400 MCG tablet Take 400 mcg by mouth daily.     furosemide  (LASIX ) 20 MG tablet Take 1 tablet (20 mg total) by mouth 2 (two) times daily. 30 tablet 5   Homeopathic Products (RESTFUL LEGS SL) Place 1 tablet under the tongue 2 (two) times daily.     levothyroxine  (SYNTHROID ) 75 MCG tablet Take 75 mcg by mouth at bedtime.     Magnesium  500 MG TABS Take 500 mg by mouth 3 (three) times a week.     Menthol-Methyl Salicylate (MUSCLE RUB) 10-15 % CREA Apply 1 application topically as needed for muscle pain.     metoprolol  tartrate (LOPRESSOR ) 50 MG tablet Take one tablet by mouth as directed prior to CT scan 1 tablet 0   nitroGLYCERIN  (NITROSTAT ) 0.4 MG SL tablet Place 1 tablet (0.4 mg total) under the tongue every 5 (five) minutes as needed for chest pain. 25 tablet 12   OVER THE COUNTER MEDICATION Take 2 tablets by mouth daily. Ultra kidney complex     OVER THE COUNTER MEDICATION Take 1 tablet by mouth daily as needed (constipation). Herbal laxative otc supplement     pantoprazole  (PROTONIX ) 40 MG tablet Take 1 tablet (40 mg total) by mouth daily. Take 30-60 min before first meal of the day 30 tablet 2   pramipexole (MIRAPEX) 0.5 MG tablet Take 0.5 mg by mouth daily.     simvastatin  (ZOCOR ) 80 MG tablet Take 80 mg by mouth every evening.      SYMBICORT  80-4.5 MCG/ACT inhaler Inhale 2 puffs into the lungs 2 (two) times daily.     TURMERIC PO Take 800 mg by mouth daily.     Potassium 99 MG TABS Take 99 mg by mouth daily as needed (leg cramps). (Patient not taking: Reported on 06/25/2024)     No current facility-administered medications for this visit.    No Known Allergies    Review of Systems:   General:  + decreased  appetite, + decreased energy, no weight gain, + weight loss, no fever  Cardiac:  no chest pain with exertion, no chest pain at rest, +SOB with mild exertion, no resting SOB, no PND, no orthopnea, no palpitations, no arrhythmia, no atrial fibrillation, + LE edema, + dizzy spells, no syncope  Respiratory:  + exertional shortness of breath, no home oxygen, no productive cough, no dry cough, no bronchitis, no wheezing, no hemoptysis, + asthma, no pain with inspiration or cough, no sleep apnea, no CPAP at night  GI:  no difficulty swallowing, no reflux, no frequent heartburn, no hiatal hernia, no abdominal pain, no constipation, no diarrhea, no hematochezia, no hematemesis, no melena  GU:   no dysuria,  no frequency, no urinary tract infection, no hematuria, no enlarged prostate, no kidney stones, + chronic kidney disease  Vascular:  no pain suggestive of claudication, no pain in feet, no leg cramps, no varicose veins, no DVT, no non-healing foot ulcer  Neuro:   no stroke, no TIA's, no seizures, no headaches, no temporary blindness one eye,  no slurred speech, no peripheral neuropathy, no chronic pain, no instability of gait, no memory/cognitive dysfunction  Musculoskeletal: no arthritis,  joint swelling, no myalgias, no difficulty walking, normal mobility   Skin:   no rash, no itching, no skin infections, no pressure sores or ulcerations  Psych:   no anxiety, no depression, no nervousness, no unusual recent stress  Eyes:   no blurry vision, no floaters, no recent vision changes, no glasses or contacts  ENT:   no hearing loss, no loose or painful teeth, + dentures   Hematologic:  no easy bruising, no abnormal bleeding, no clotting disorder, no frequent epistaxis  Endocrine:  no diabetes, does not check CBG's at home     Physical Exam:   BP (!) 127/53 (BP Location: Left Arm)   Pulse 80   Resp 18   Ht 6' 3 (1.905 m)   Wt 201 lb (91.2 kg)   SpO2 95% Comment: RA  BMI 25.12 kg/m   General:  Thin,  well-appearing  HEENT:  Unremarkable, NCAT, PERLA, EOMI  Neck:   no JVD, no bruits, no adenopathy   Chest:   clear to auscultation, symmetrical breath sounds, no wheezes, no rhonchi   CV:   RRR, 3/6 systolic murmur RSB, soft S2 with slight diastolic murmur   Abdomen:  soft, non-tender, no masses   Extremities:  warm, well-perfused, pedal pulses palpable, no lower extremity edema  Rectal/GU  Deferred  Neuro:   Grossly non-focal and symmetrical throughout  Skin:   Clean and dry, no rashes, no breakdown  Diagnostic Tests:  ECHOCARDIOGRAM REPORT       Patient Name:   Ryan [name] Date of Exam: 04/03/2024 Medical Rec #:  [id number]        Height:       75.0 in Accession #:    [id number]       Weight:       215.0 lb Date of Birth:  [birth year]        BSA:          2.263 m Patient Age:    72 years         BP:           138/60 mmHg Patient Gender: M                HR:           75 bpm. Exam Location:  [place]  Procedure: 2D Echo, Cardiac Doppler and Color Doppler (Both Spectral and Color            Flow Doppler were utilized during procedure).  Indications:    I35.0 Nonrheumatic aortic (valve) stenosis; R06.9 DOE   History:        Patient has prior history of Echocardiogram examinations, most                 recent 02/18/2020. CAD, Prior CABG, Stroke and Carotid Disease,  Aortic Valve Disease and Mitral Valve Disease,                 Signs/Symptoms:Murmur and Dyspnea; Risk Factors:Hypertension and                 Dyslipidemia. Breast cancer                 Hodgkin's Lymphoma.   Sonographer:    Bascom Burows RCS, RVS Referring Phys: [id number] VISHNU P MALLIPEDDI    Sonographer Comments: Suboptimal subcostal window. IMPRESSIONS    1. Left ventricular ejection fraction, by estimation, is 40 to 45%. Left ventricular ejection fraction by 3D volume is 46 %. The left ventricle has mildly decreased function. The left ventricle has no regional wall motion abnormalities.  Left ventricular  diastolic parameters are indeterminate.  2. Right ventricular systolic function is mildly reduced. The right ventricular size is moderately enlarged.  3. Left atrial size was mildly dilated.  4. The mitral valve is abnormal. No evidence of mitral valve regurgitation. Severe mitral stenosis. The mean mitral valve gradient is 11.3 mmHg. Severe mitral annular calcification.  5. Aortic dilatation noted. There is moderate dilatation of the aortic root, measuring 44 mm.  6. The aortic valve has an indeterminant number of cusps. There is moderate calcification of the aortic valve. There is moderate thickening of the aortic valve. There is severe restriction in the opening of aortic valve leaflets. Aortic valve regurgitation is moderate to severe, eccentric jet. Severity of aortic regurgitation is likely underestimated due to the eccentric nature of the jet. There is low-flow, low-gradient severe aortic valve stenosis. Aortic regurgitation PHT measures 285 msec. Aortic valve area, by VTI measures 1.09 cm. Aortic valve mean gradient measures 23.0 mmHg. Aortic valve Vmax measures 3.21 m/s.  Comparison(s): A prior study was performed on 02/18/2020. EF 65-70%. Right ventricular systolic function moderately reduced. The RV size is mildly dilated. Normal PASP. Moderate mitral valve stenosis-mean pg 9.0 mmHg. Mild MR. Moderate aortic valve stenosis-mean pg 17.3 mmHg. Moderate AI.  FINDINGS  Left Ventricle: Left ventricular ejection fraction, by estimation, is 40 to 45%. Left ventricular ejection fraction by 3D volume is 46 %. The left ventricle has mildly decreased function. The left ventricle has no regional wall motion abnormalities. Strain was performed and the global longitudinal strain is indeterminate. The left ventricular internal cavity size was normal in size. There is no left ventricular hypertrophy. Left ventricular diastolic parameters are indeterminate.  Right  Ventricle: The right ventricular size is moderately enlarged. No increase in right ventricular wall thickness. Right ventricular systolic function is mildly reduced.  Left Atrium: Left atrial size was mildly dilated.  Right Atrium: Right atrial size was normal in size.  Pericardium: There is no evidence of pericardial effusion.  Mitral Valve: The mitral valve is abnormal. Severe mitral annular calcification. No evidence of mitral valve regurgitation. Severe mitral valve stenosis. MV peak gradient, 26.4 mmHg. The mean mitral valve gradient is 11.3 mmHg.  Tricuspid Valve: The tricuspid valve is normal in structure. Tricuspid valve regurgitation is mild . No evidence of tricuspid stenosis.  Aortic Valve: The aortic valve has an indeterminant number of cusps. There is moderate calcification of the aortic valve. There is moderate thickening of the aortic valve. Aortic valve regurgitation is moderate to severe. Aortic regurgitation PHT measures 285 msec. Moderate aortic stenosis is present. Aortic valve mean gradient measures 23.0 mmHg. Aortic valve peak gradient measures 41.2 mmHg. Aortic valve area, by VTI measures 1.09 cm.  Pulmonic Valve:  The pulmonic valve was normal in structure. Pulmonic valve regurgitation is trivial. No evidence of pulmonic stenosis.  Aorta: Aortic dilatation noted. There is moderate dilatation of the aortic root, measuring 44 mm.  Venous: The inferior vena cava was not well visualized.  IAS/Shunts: No atrial level shunt detected by color flow Doppler.  Additional Comments: 3D was performed not requiring image post processing on an independent workstation and was abnormal.    LEFT VENTRICLE PLAX 2D LVIDd:         4.30 cm LVIDs:         3.70 cm LV PW:         1.10 cm         3D Volume EF LV IVS:        1.00 cm         LV 3D EF:    Left LVOT diam:     2.10 cm                      ventricul LV SV:         74                           ar LV SV Index:    33                           ejection LVOT Area:     3.46 cm                     fraction                                             by 3D                                             volume is LV Volumes (MOD)                            46 %. LV vol d, MOD    61.5 ml A2C: LV vol d, MOD    94.8 ml       3D Volume EF: A4C:                           3D EF:        46 % LV vol s, MOD    40.8 ml A2C: LV vol s, MOD    55.2 ml A4C: LV SV MOD A2C:   20.7 ml LV SV MOD A4C:   94.8 ml LV SV MOD BP:    32.5 ml  RIGHT VENTRICLE RV Basal diam:  4.60 cm RV Mid diam:    4.80 cm RV S prime:     5.44 cm/s TAPSE (M-mode): 1.2 cm  LEFT ATRIUM              Index        RIGHT ATRIUM           Index LA diam:        4.30 cm  1.90 cm/m   RA  Area:     16.40 cm LA Vol (A2C):   102.0 ml 45.08 ml/m  RA Volume:   42.80 ml  18.91 ml/m LA Vol (A4C):   68.1 ml  30.09 ml/m LA Biplane Vol: 85.4 ml  37.74 ml/m  AORTIC VALVE                     PULMONIC VALVE AV Area (Vmax):    1.14 cm      PV Vmax:       0.68 m/s AV Area (Vmean):   1.11 cm      PV Peak grad:  1.9 mmHg AV Area (VTI):     1.09 cm AV Vmax:           321.00 cm/s AV Vmean:          225.500 cm/s AV VTI:            0.676 m AV Peak Grad:      41.2 mmHg AV Mean Grad:      23.0 mmHg LVOT Vmax:         105.50 cm/s LVOT Vmean:        72.450 cm/s LVOT VTI:          0.214 m LVOT/AV VTI ratio: 0.32 AI PHT:            285 msec AR Vena Contracta: 1.00 cm   AORTA Ao Root diam: 4.40 cm Ao Asc diam:  3.50 cm  MITRAL VALVE               TRICUSPID VALVE MV Area (PHT): 2.22 cm    TR Peak grad:   41.0 mmHg MV Area VTI:   1.07 cm    TR Vmax:        320.00 cm/s MV Peak grad:  26.4 mmHg MV Mean grad:  11.3 mmHg   SHUNTS MV Vmax:       2.57 m/s    Systemic VTI:  0.21 m MV Vmean:      154.7 cm/s  Systemic Diam: 2.10 cm  Vishnu Priya Mallipeddi Electronically signed by Diannah Late Mallipeddi Signature Date/Time: 04/04/2024/4:47:13 PM        Final     Procedures  RIGHT HEART CATH AND CORONARY ANGIOGRAPHY   Conclusion      Mid LM to Dist LM lesion is 90% stenosed.   Ost LAD lesion is 80% stenosed.   1st Mrg lesion is 50% stenosed.   Prox RCA to Mid RCA lesion is 90% stenosed.   1.  Patent LIMA to LAD, left radial to ramus, and vein graft to RCA grafts. 2.  Severe native vessel disease. 3.  Fick cardiac output of 4.8 L/min and Fick cardiac index of 2.2 L/min/m with the following hemodynamics:            Right atrial pressure mean of 11 mmHg            Right ventricular pressure 65/22 with end-diastolic pressure of 10 mmHg            PA pressure 53/36 with a mean of 36 mmHg            Wedge pressure mean of 20 mmHg with V waves to 22 mmHg            PVR of 3.35            PA pulsatility index of 1.5 4.  Capacious iliofemoral vessels bilaterally   Recommendation: Continue  evaluation for aortic valve intervention.  Increase Lasix  to 20 mg twice daily and check BMP in 1 week given elevated RA pressure and decreased PA pulsatility index.   Procedural Details  Technical Details The patient is a 73 year old male with a history of severe aortic stenosis, mitral stenosis, bifascicular/trifascicular block, coronary artery disease status post CABG consisting of a LIMA to LAD, left radial to ramus, and vein graft to PDA, type 2 diabetes, hyperlipidemia, CKD stage IIIa, and carotid disease who was seen in the outpatient setting due to lifestyle limiting dyspnea.  He is referred for right heart catheterization and coronary angiography study as a preprocedural assessment prior to an aortic valve intervention.  After obtaining consent, the patient was brought to the cardiac catheterization laboratory and prepped draped sterile fashion.  Xylocaine  was used to anesthetize the right groin and ultrasound was used to gain access to the right common femoral artery.  A 6 French sheath was placed.  Ultrasound was also used to gain access to the  right common femoral vein and a 5 French sheath was placed.  Coronary and bypass angiography was performed with standard 6 French JR4 and JL 4 diagnostic catheters.  A right heart catheterization was performed with a 5 [place] balloontipped catheter.  After review of the angiographic data and hemodynamic data, no further inventions were pursued.  Manual pressure will be applied to the access sites for hemostasis. Estimated blood loss <50 mL.   During this procedure medications were administered to achieve and maintain moderate conscious sedation while the patient's heart rate, blood pressure, and oxygen saturation were continuously monitored and I was present face-to-face 100% of this time. Bette Bunker Cardiovascular Specialist and Carrolyn Gals RN are independent, trained observers who assisted in the monitoring of the patient's level of consciousness.   Medications (Filter: Administrations occurring from 1010 to 1133 on 05/20/24) fentaNYL  (SUBLIMAZE ) injection (mcg)  Total dose: 25 mcg Date/Time Rate/Dose/Volume Action   05/20/24 1034 25 mcg Given   midazolam  (VERSED ) injection (mg)  Total dose: 1 mg Date/Time Rate/Dose/Volume Action   05/20/24 1034 1 mg Given   lidocaine  (PF) (XYLOCAINE ) 1 % injection (mL)  Total volume: 5 mL Date/Time Rate/Dose/Volume Action   05/20/24 1046 5 mL Given   iohexol  (OMNIPAQUE ) 350 MG/ML injection (mL)  Total volume: 85 mL Date/Time Rate/Dose/Volume Action   05/20/24 1123 85 mL Given   Heparin  (Porcine) in NaCl 1000-0.9 UT/500ML-% SOLN (mL)  Total volume: 1,000 mL Date/Time Rate/Dose/Volume Action   05/20/24 1123 1,000 mL Given    Sedation Time  Sedation Time Physician-1: 47 minutes 9 seconds Contrast     Administrations occurring from 1010 to 1133 on 05/20/24:  Medication Name Total Dose  iohexol  (OMNIPAQUE ) 350 MG/ML injection 85 mL   Radiation/Fluoro  Fluoro time: 7.9 (min) DAP: 16.6 (Gycm2) Cumulative Air Kerma: 181.2  (mGy) Complications  Complications documented before study signed (05/20/2024 11:45 AM)   No complications were associated with this study.  Documented by Gals Carrolyn SAUNDERS, RN - 05/20/2024 11:23 AM     Coronary Findings  Diagnostic Dominance: Right Left Main  Mid LM to Dist LM lesion is 90% stenosed.    Left Anterior Descending  Ost LAD lesion is 80% stenosed.    Left Circumflex    First Obtuse Marginal Branch  1st Mrg lesion is 50% stenosed.    Right Coronary Artery  Prox RCA to Mid RCA lesion is 90% stenosed.    LIMA Graft To Dist LAD    Graft  To Dist RCA    Graft To 1st Mrg    Intervention   No interventions have been documented.   Coronary Diagrams  Diagnostic Dominance: Right  Intervention   Implants   No implant documentation for this case.   Syngo Images   Show images for CARDIAC CATHETERIZATION Images on Long Term Storage   Show images for Bari, Handshoe Link to Procedure Log  Procedure Log    Hemodynamics  Pressures Phases Resting  Right     RA Mean  mmHg 11    RA A-Wave  mmHg 17    RA V-Wave  mmHg 15  Pulmonary     PA  mmHg 53/36 (36)    PCW Mean  mmHg 20.0    PCW A-Wave  mmHg 20.0    PCW V-Wave  mmHg 22.0    PAPi   1.5    Saturations Phases Resting    PA  % 55    Arterial  % 91    Hemo Data  Flowsheet Row Most Recent Value  Fick Cardiac Output 4.77 L/min  Fick Cardiac Output Index 2.16 (L/min)/BSA  RA A Wave 17 mmHg  RA V Wave 15 mmHg  RA Mean 11 mmHg  RV Systolic Pressure 65 mmHg  RV Diastolic Pressure 22 mmHg  RV EDP 10 mmHg  PA Systolic Pressure 53 mmHg  PA Diastolic Pressure 36 mmHg  PA Mean 36 mmHg  PW A Wave 20 mmHg  PW V Wave 22 mmHg  PW Mean 20 mmHg  AO Systolic Pressure 128 mmHg  AO Diastolic Pressure 69 mmHg  AO Mean 88 mmHg  QP/QS 0.94  TPVR Index 17.63 HRUI  TSVR Index 40.69 HRUI  PVR SVR Ratio 0.22  TPVR/TSVR Ratio 0.43   Narrative & Impression  CLINICAL DATA:  Pre TAVR    MEDICATIONS: MEDICATIONS None   EXAM: Cardiac TAVR CT   TECHNIQUE: The patient was scanned on a GE apex scanner. 1 beat acquisition triggered in the descending thoracic aorta at 110 HU's. A non contrast, gated CT scan was obtained first with axial slices of 2.5 mm through the heart for valve scoring. A 120 kV retrospective, gated, contrast scan done with gantry rotation speed of 230 msec and collimation 0.63 mm. A delayed scan was obtained to exclude LAA thrombus. The 3D data set was reconstructed in 5% intervals of the R-R cycle. Best systolic phase was motion corrected Images were analyzed on a dedicated workstation using MPR, MIP and VRT modes The patient received 100 cc of contrast   FINDINGS: Aortic Valve: Tri cuspid calcified with score 4394   Aorta: No aneurysm.  Bovine Arch severe calcific atherosclerosis   Sinotubular Junction: 30.7 mm   Ascending Thoracic Aorta: 36 mm   Aortic Arch: 30.6 mm   Descending Thoracic Aorta: 23.6 mm   Sinus of Valsalva Measurements:   Non-coronary: 40.5 mm Height 26.3 mm   Right - coronary: 39.5 mm  Height 21.7 mm   Left - coronary: 38.4 mm Height 22.4 mm   Coronary Artery Height above Annulus:   Left Main: 18 mm above annulus   Right Coronary: 20.5 mm above annulus   Virtual Basal Annulus Measurements:   Maximum/Minimum Diameter: 31 mm x 25 mm   Perimeter: 90 mm   Area: 626 mm2 extensive annular calcium  at base of left cusp   Coronary Arteries: Sufficient height above annulus for deployment. Patent LIMA to LAD, patient Radial to IM, and patent SVG to RCA  Optimum Fluoroscopic Angle for Delivery: LAO 3 Caudal 2 degrees   Severe MAC including entire intervalvular fibrosa below aortic annulus   Membranous septal length 5.4 mm   IMPRESSION: 1.  Calcified tri leaflet AV with score 4394   2. Annular area of 626 mm2 and perimeter 90 mm suitable for a 29 mm Sapien 3 or 34 mm Evolut valve   3. Coronary arteries  sufficient height above annulus for deployment. Patient LIMA to LAD and patent SVG RCA, and patent radial graft to IM.   4.  Optimum angiographic angle for deployment LAO 3 Caudal 3 degrees   5. Severe MAC including both anterior, posterior annulus and intervalvular fibrosa   6.  Membranous septal length 5.4 mm   Maude Emmer   Electronically Signed: By: Maude Emmer M.D. On: 06/02/2024 12:41      Narrative & Impression  CLINICAL DATA:  Aortic valve replacement, preoperative evaluation   EXAM: CTA ABDOMEN AND PELVIS WITHOUT AND WITH CONTRAST   TECHNIQUE: Multidetector CT imaging of the abdomen and pelvis was performed using the standard protocol during bolus administration of intravenous contrast. Multiplanar reconstructed images and MIPs were obtained and reviewed to evaluate the vascular anatomy.   RADIATION DOSE REDUCTION: This exam was performed according to the departmental dose-optimization program which includes automated exposure control, adjustment of the mA and/or kV according to patient size and/or use of iterative reconstruction technique.   CONTRAST:  OMNIPAQUE  IOHEXOL  350 MG/ML SOLN   COMPARISON:  None Available.   FINDINGS: VASCULAR   Aorta: Mild atherosclerotic changes with minimal diameter estimated at 11 mm. No abdominal aortic aneurysm.   Celiac: Mild atherosclerotic changes in the proximal segment, patent.   SMA: Patent.   Renals: Mild atherosclerotic changes in the proximal segments bilaterally, patent.   IMA: Patent.   Inflow: The right common iliac artery is patent with minimal diameter estimated at 8 mm. The right internal iliac artery is patent. The right external iliac artery is patent with minimal diameter estimated at 7 mm.   The left common iliac artery is patent with minimal diameter estimated at 8 mm. The left internal iliac artery is patent. Left external iliac artery is patent with minimal diameter estimated at  8 mm.   Proximal Outflow: The right and left common femoral arteries are patent with minimal diameter estimated at 6 mm.   Veins: No obvious venous abnormality within the limitations of this arterial phase study.   Review of the MIP images confirms the above findings.   NON-VASCULAR   Lower chest: Reported separately.   Hepatobiliary: No gallstones, gallbladder wall thickening, or biliary dilatation.   Pancreas: Unremarkable. No pancreatic ductal dilatation or surrounding inflammatory changes.   Spleen: Normal in size without focal abnormality.   Adrenals/Urinary Tract: The adrenal glands are within normal limits. No hydronephrosis or significant nephrolithiasis. Urinary bladder is within normal limits.   Stomach/Bowel: No dilated loops of bowel are seen. Scattered colonic diverticulosis.   Lymphatic: No lymphadenopathy.   Reproductive: Prostate is unremarkable.   Other: Nothing significant.   Musculoskeletal: No acute or significant osseous findings.   IMPRESSION: 1. Minimal access vessel diameters detailed above.     Electronically Signed   By: Maude Naegeli M.D.   On: 06/03/2024 07:30     Impression:  This 73 year old gentleman has stage D, severe, symptomatic, low-flow/low gradient aortic stenosis with NYHA class II symptoms of exertional fatigue and shortness of breath consistent with chronic diastolic congestive heart failure.  I  have personally reviewed his 2D echocardiogram, cardiac catheterization, and CTA studies.  His echo shows a severely calcified and thickened aortic valve with restricted leaflet mobility.  The mean gradient is only 23 mmHg with a valve area is 1.09 cm with a low stroke-volume index of 33 and moderate to severe aortic insufficiency.  Left ventricular ejection fraction is 40 to 45%.  There is also severe mitral annular calcification with a mean gradient of 11.3 mmHg consistent with severe mitral valve stenosis without insufficiency.   Cardiac catheterization shows patent bypass grafts.  PA pressure  was 53/36 with a mean of 36 with a wedge pressure of 20 mmHg and V waves to 22 mmHg.  Cardiac index was 2.2.  Given his age, prior radiation therapy and coronary bypass graft surgery as well as other comorbidities I do not think he is a candidate for open surgical double valve replacement.  I think the best option for treating him would be transcatheter aortic valve replacement with the hope that this will significantly improve his symptoms.  His mitral stenosis may require intervention at some point.  His gated cardiac CTA shows anatomy suitable for TAVR using a 29 mm SAPIEN 3 valve.  His abdominal and pelvic CTA shows adequate pelvic vascular anatomy to allow transfemoral insertion.  Electrocardiogram shows first-degree AV block with right bundle branch block and left anterior fascicular block which will increase the risk of complete heart block and we will plan to use right IJ pacing.  The patient and his wife were counseled at length regarding treatment alternatives for management of severe symptomatic aortic stenosis. The risks and benefits of surgical intervention has been discussed in detail. Long-term prognosis with medical therapy was discussed. Alternative approaches such as conventional surgical aortic valve replacement, transcatheter aortic valve replacement, and palliative medical therapy were compared and contrasted at length. This discussion was placed in the context of the patient's own specific clinical presentation and past medical history. All of their questions have been addressed.   Following the decision to proceed with transcatheter aortic valve replacement, a discussion was held regarding what types of management strategies would be attempted intraoperatively in the event of life-threatening complications, including whether or not the patient would be considered a candidate for the use of cardiopulmonary bypass and/or  conversion to open sternotomy for attempted surgical intervention.  Given his age, comorbidities, and prior coronary bypass surgery I do not think he is a candidate for emergent sternotomy to manage any intraoperative complications.  The patient has been advised of a variety of complications that might develop including but not limited to risks of death, stroke, paravalvular leak, aortic dissection or other major vascular complications, aortic annulus rupture, device embolization, cardiac rupture or perforation, mitral regurgitation, acute myocardial infarction, arrhythmia, heart block or bradycardia requiring permanent pacemaker placement, congestive heart failure, respiratory failure, renal failure, pneumonia, infection, other late complications related to structural valve deterioration or migration, or other complications that might ultimately cause a temporary or permanent loss of functional independence or other long term morbidity. The patient provides full informed consent for the procedure as described and all questions were answered.      Plan:  He will be scheduled for transfemoral TAVR using a SAPIEN 3 valve on 07/08/2024.  I spent 60 minutes performing this consultation and > 50% of this time was spent face to face counseling and coordinating the care of this patient's severe symptomatic aortic stenosis and insufficiency.   Dorise LOIS Fellers, MD 06/25/2024 4:42 PM

## 2024-06-30 ENCOUNTER — Ambulatory Visit: Attending: Internal Medicine | Admitting: Internal Medicine

## 2024-06-30 ENCOUNTER — Other Ambulatory Visit: Payer: Self-pay

## 2024-06-30 ENCOUNTER — Encounter: Payer: Self-pay | Admitting: Internal Medicine

## 2024-06-30 VITALS — BP 132/60 | HR 82 | Ht 75.0 in | Wt 199.8 lb

## 2024-06-30 DIAGNOSIS — I342 Nonrheumatic mitral (valve) stenosis: Secondary | ICD-10-CM | POA: Insufficient documentation

## 2024-06-30 DIAGNOSIS — I350 Nonrheumatic aortic (valve) stenosis: Secondary | ICD-10-CM | POA: Insufficient documentation

## 2024-06-30 DIAGNOSIS — E785 Hyperlipidemia, unspecified: Secondary | ICD-10-CM | POA: Insufficient documentation

## 2024-06-30 DIAGNOSIS — Z951 Presence of aortocoronary bypass graft: Secondary | ICD-10-CM | POA: Diagnosis not present

## 2024-06-30 DIAGNOSIS — I351 Nonrheumatic aortic (valve) insufficiency: Secondary | ICD-10-CM | POA: Insufficient documentation

## 2024-06-30 NOTE — Patient Instructions (Addendum)

## 2024-06-30 NOTE — Progress Notes (Signed)
 Cardiology Office Note  Date: 06/30/2024   ID: DIERKS WACH, DOB [birth year], MRN [id number]  PCP:  Shona Norleen PEDLAR, MD  Cardiologist:  [name], MD Electrophysiologist:  None   History of Present Illness: Ryan [name] is a 73 y.o. male  Referred to cardiology clinic initially for worsening DOE, angina and exertional dizziness.  He had ESM with absent S2 consistent with severe AS on examination.  Echocardiogram revealed LVEF 40 to 45%, mildly reduced RV systolic function, moderate enlargement of RV, severe AS with moderate to severe eccentric AI, severe MS, moderate dilatation of the aortic root 44 mm. RHC/LHC showed severe native vessel CAD, patent bypass grafts, RA pressure 11 mmHg, RV pressure 65/22 with end-diastolic pressure of 10 mmHg, PA pressure 53/36 with a mean of 36 mmHg, wedge pressure mean of 20 mmHg with V wave to 22 mmHg.    DOE improving now after increasing p.o. Lasix  from 20 mg once daily to twice daily.  Not on BB.  Does not have any other symptoms of syncope, leg swelling or palpitations.  Specialist Notes and imaging reviewed.  Past Medical History:  Diagnosis Date   Adie's pupil    Anemia 07/14/2013   Borderline diabetes    CAD (coronary artery disease), native coronary artery 04/09/2013   Dist LM stenosis 70-80% (at trifurcation into LAD, CX, & 2 Ramus branches, 70% mid RCA);; b) Myoview  08/06/14: Low Risk, no Ischemia/infarction   Depression 1983   after I was dx'd w/CA; now just comes in spells  (12/22/2015)   Essential hypertension    Exertional shortness of breath    Hodgkin's lymphoma (HCC) 1983   Hypercholesterolemia    Hypothyroidism    Invasive ductal carcinoma of breast, stage 2 (HCC) 04/27/2011   chemo, mastectomy   Left carotid artery partial occlusion 04/27/2011   CAROTIC DOPPLER 11/2013: < 40% R ICA stenosis, distal waveforms damped - suggests probably intracranial occlusoin, < 40% LICA, normal vertebrals -- no change   Mild  aortic stenosis by prior echocardiogram 02/2018   By echo June 2020: Mild-Mod AS (Mean Gradient 13.5 mmHg)   Mild mitral stenosis by prior echocardiogram 08/06/2014   Most recent echo June 2020: Estimated valve area 2.83 cm (in 2019 was roughly 2 cm).  Mean gradient 6-8 mmHg.  Appears to be stable.   Personal history of chemotherapy    Pre-diabetes    Restless leg syndrome    Right carotid artery occlusion 04/27/2011   S/P CABG x 3 04/09/2013   LIMA-LAD, fLRAD-RI, SVG-RCA; Echo 10/29/'15:  mild Conc LVH, no WMA, Gr 1 DD, Mod AoV Sclerosis w/ Mod AI, ~Mild-Mod MS    Sleep apnea    Stroke [facility])    2007   Thyroid  disease     Past Surgical History:  Procedure Laterality Date   BIOPSY  09/08/2020   Procedure: BIOPSY;  Surgeon: Golda Claudis PENNER, MD;  [facility];  Service: Endoscopy;;  colon   BIOPSY  12/09/2021   Procedure: BIOPSY;  Surgeon: Eartha Angelia Sieving, MD;  Location: [facility];  Service: Gastroenterology;;   BREAST BIOPSY Right 2012   CARDIAC CATHETERIZATION  2014   CATARACT EXTRACTION W/PHACO Right 06/21/2015   Procedure: CATARACT EXTRACTION PHACO AND INTRAOCULAR LENS PLACEMENT (IOC);  Surgeon: Cherene Mania, MD;  Location: [facility];  Service: Ophthalmology;  Laterality: Right;  CDE: 7.05   CATARACT EXTRACTION W/PHACO Left 07/01/2015   Procedure: CATARACT EXTRACTION PHACO AND INTRAOCULAR LENS PLACEMENT (IOC);  Surgeon: Cherene Mania, MD;  Location: [facility];  Service: Ophthalmology;  Laterality: Left;  CDE: 7.49   COLONOSCOPY [name] 05/06/2015   Procedure: COLONOSCOPY;  Surgeon: Claudis [name] Rivet, MD;  [facility]: [facility];  Service: Endoscopy;  Laterality: [name];  930   COLONOSCOPY WITH PROPOFOL  [name] 09/08/2020   Procedure: COLONOSCOPY WITH PROPOFOL ;  Surgeon: Rivet Claudis [name], MD;  [facility];  Service: Endoscopy;  Laterality: [name];  915   CORONARY ARTERY BYPASS GRAFT [name] 04/08/2013   Procedure: CORONARY ARTERY BYPASS GRAFTING (CABG);  Surgeon: Maude Fleeta Ochoa, MD;  Location: [facility] OR;  Service: Open Heart Surgery;  Laterality: [name];  Times 3 using left internal mammary artery; endoscopically harvested right saphenous vein and left radial artery   ESOPHAGOGASTRODUODENOSCOPY (EGD) WITH PROPOFOL  [name] 12/09/2021   Procedure: ESOPHAGOGASTRODUODENOSCOPY (EGD) WITH PROPOFOL ;  Surgeon: Eartha Angelia Sieving, MD;  [facility];  Service: Gastroenterology;  Laterality: [name];  1250 ASA 2   INTRAOPERATIVE TRANSESOPHAGEAL ECHOCARDIOGRAM [name] 04/08/2013   Procedure: INTRAOPERATIVE TRANSESOPHAGEAL ECHOCARDIOGRAM;  Surgeon: Maude Fleeta Ochoa, MD;  Location: [facility] OR;  Service: Open Heart Surgery;  Laterality: [name];   LAPAROSCOPIC APPENDECTOMY [name] 12/23/2015   Procedure: APPENDECTOMY LAPAROSCOPIC;  Surgeon: Donnice Lima, MD;  Location: [facility] OR;  Service: General;  Laterality: [name];   LEFT HEART CATHETERIZATION WITH CORONARY ANGIOGRAM [name] 04/07/2013   Procedure: LEFT HEART CATHETERIZATION WITH CORONARY ANGIOGRAM;  Surgeon: Alm LELON Clay, MD;  Location: [facility] CATH LAB;  Service: Cardiovascular;  Laterality: [name];   LYMPH NODE BIOPSY Left 1983   neck   MASTECTOMY Right 2012   w/axillary lymph node dissection   NM MYOVIEW  LTD  08/2019    EF 55 to 60%.  LOW RISK.  No ischemia or infarction noted.   NM TREADMILL MYOVIEW  LTD  08/06/2014   Exercise 5:07 min, 6.3 METS; symptoms noted or extreme dyspnea and lightheadedness with some chest tightness. No EKG changes. No ischemia or infarction was normal EF.   POLYPECTOMY  09/08/2020   Procedure: POLYPECTOMY;  Surgeon: Rivet Claudis [name], MD;  [facility];  Service: Endoscopy;;   RADIAL ARTERY HARVEST Left 04/08/2013   Procedure: RADIAL ARTERY HARVEST;  Surgeon: Maude Fleeta Ochoa, MD;  Location: [facility] OR;  Service: Open Heart Surgery;  Laterality: Left;   RIGHT HEART CATH AND CORONARY ANGIOGRAPHY [name] 05/20/2024   Procedure: RIGHT HEART CATH AND CORONARY ANGIOGRAPHY;  Surgeon: Wendel Lurena POUR, MD;  Location: [facility] INVASIVE  CV LAB;  Service: Cardiovascular;  Laterality: [name];   SAVORY DILATION  12/09/2021   Procedure: SAVORY DILATION;  Surgeon: Eartha Angelia Sieving, MD;  [facility];  Service: Gastroenterology;;   TONSILLECTOMY  1974   TRANSTHORACIC ECHOCARDIOGRAM  08/06/2014; 02/2018   a) Nl EF 55-60%. Gr 1 DD-high LVEDP. Mod AoV Sclerosis w/o AS. Mild AI; mild MS - mean grad 7 mmHg @ HR 122 bpm.;; b) Initial: EF 55-60%, GRII DD.  HighLAP/LVEDP.  ? Bicuspid AoV -severely Ca2+.  Mild Ao root dilation.  Severe MAC, mild MS (MVA by P1/2 T & continuity equation ~2 cm).  Mod RV dilation w/ mild- mod reduced EF.---> Limited w/Defiinity: No RWMA, Mild AS, Mod MS (~mean gradient 8 mmHg   TRANSTHORACIC ECHOCARDIOGRAM  02/2020   Normal LV size and function.  EF 65 to 70% no R WMA.  Mild LVH with basal septum.  GRII DD with elevated LVEDP.  Moderate reduced RV function with mild RV dilation, but normal PA pressures.  Mild LA dilation.  Moderate mitral stenosis, moderate AS & AI.  Mild MR, Mod MS (mean gradient 9 mmHg).  Moderate calcified AS w/ Mod AI -> Mean gradient 17.3 mmHg with peak 32 mm.   TRANSTHORACIC ECHOCARDIOGRAM  03/2019    EF 60-65%, mild concentric LVH. Gr 2 DD. No RWMA. Mild-Mod RV dilation & enlargement. Mild LA dilation. Mod MV Calcification . Mod MS (previously read as mild, but mean gradient is lower than and estimated valve area is higher than last check).  Mild-Mod AS (Mean Gradient 13.5 mmHg)    Current Outpatient Medications  Medication Sig Dispense Refill   acetaminophen  (TYLENOL ) 500 MG tablet Take 500 mg by mouth every 6 (six) hours as needed for mild pain.      albuterol  (VENTOLIN  HFA) 108 (90 Base) MCG/ACT inhaler Inhale 2 puffs into the lungs as needed for wheezing or shortness of breath. 18 each 2   ALPRAZolam  (XANAX ) 1 MG tablet Take 1 mg by mouth at bedtime.     Artificial Tear Solution (SOOTHE XP OP) Place 1 drop into both eyes daily as needed (dry eyes).     aspirin  EC 81 MG  tablet Take 81 mg by mouth every evening. 90 tablet 3   b complex vitamins capsule Take 1 capsule by mouth daily.     busPIRone (BUSPAR) 5 MG tablet Take 5 mg by mouth daily.     Cholecalciferol (VITAMIN D3) 50 MCG (2000 UT) capsule Take 2,000 Units by mouth daily.     citalopram  (CELEXA ) 20 MG tablet Take 20 mg by mouth 2 (two) times daily.     docusate sodium  (COLACE) 100 MG capsule Take 100 mg by mouth daily as needed for moderate constipation.     famotidine  (PEPCID ) 20 MG tablet One after supper 30 tablet 11   folic acid  (FOLVITE ) 400 MCG tablet Take 400 mcg by mouth daily.     furosemide  (LASIX ) 20 MG tablet Take 1 tablet (20 mg total) by mouth 2 (two) times daily. 30 tablet 5   Homeopathic Products (RESTFUL LEGS SL) Place 1 tablet under the tongue 2 (two) times daily.     levothyroxine  (SYNTHROID ) 75 MCG tablet Take 75 mcg by mouth at bedtime.     Magnesium  500 MG TABS Take 500 mg by mouth 3 (three) times a week.     Menthol-Methyl Salicylate (MUSCLE RUB) 10-15 % CREA Apply 1 application topically as needed for muscle pain.     metoprolol  tartrate (LOPRESSOR ) 50 MG tablet Take one tablet by mouth as directed prior to CT scan 1 tablet 0   nitroGLYCERIN  (NITROSTAT ) 0.4 MG SL tablet Place 1 tablet (0.4 mg total) under the tongue every 5 (five) minutes as needed for chest pain. 25 tablet 12   OVER THE COUNTER MEDICATION Take 2 tablets by mouth daily. Ultra kidney complex     OVER THE COUNTER MEDICATION Take 1 tablet by mouth daily as needed (constipation). Herbal laxative otc supplement     pantoprazole  (PROTONIX ) 40 MG tablet Take 1 tablet (40 mg total) by mouth daily. Take 30-60 min before first meal of the day 30 tablet 2   pramipexole (MIRAPEX) 0.5 MG tablet Take 0.5 mg by mouth daily.     simvastatin  (ZOCOR ) 80 MG tablet Take 80 mg by mouth every evening.      SYMBICORT  80-4.5 MCG/ACT inhaler Inhale 2 puffs into the lungs 2 (two) times daily.     TURMERIC PO Take 800 mg by mouth daily.  No current facility-administered medications for this visit.   Allergies:  Patient has no known allergies.   Social History: The patient  reports that he quit smoking about 42 years ago. His smoking use included cigarettes. He started smoking about 57 years ago. He has a 15 pack-year smoking history. He has never used smokeless tobacco. He reports that he does not drink alcohol and does not use drugs.   Family History: The patient's family history includes Alzheimer's disease in his mother; Breast cancer in his paternal aunt; Diabetes in his mother, paternal grandfather, and paternal grandmother; Hyperlipidemia in his mother; Hypertension in his father and mother; Ovarian cancer in his paternal aunt; Stroke in his mother.   ROS:  Please see the history of present illness. Otherwise, complete review of systems is positive for none  All other systems are reviewed and negative.   Physical Exam: VS:  BP 132/60   Pulse 82   Ht 6' 3 (1.905 m)   Wt 199 lb 12.8 oz (90.6 kg)   SpO2 97%   BMI 24.97 kg/m , BMI Body mass index is 24.97 kg/m.  Wt Readings from Last 3 Encounters:  06/30/24 199 lb 12.8 oz (90.6 kg)  06/25/24 201 lb (91.2 kg)  05/20/24 194 lb (88 kg)    General: Patient appears comfortable at rest. HEENT: Conjunctiva and lids normal, oropharynx clear with moist mucosa. Neck: Supple, no elevated JVP or carotid bruits, no thyromegaly. Lungs: Clear to auscultation, nonlabored breathing at rest. Cardiac: ESM with absent S2 Abdomen: Soft, nontender, no hepatomegaly, bowel sounds present, no guarding or rebound. Extremities:  Minimal pitting edema in bilateral lower EXTR's. Skin: Warm and dry. Musculoskeletal: No kyphosis. Neuropsychiatric: Alert and oriented x3, affect grossly appropriate.  Recent Labwork: 12/19/2023: BNP 193.1; TSH 5.320 02/05/2024: ALT 22; AST 24 05/12/2024: Platelets 265 05/20/2024: Hemoglobin 12.9 05/27/2024: BUN 19; Creatinine, Ser 1.69; Potassium 4.0; Sodium  141     Component Value Date/Time   CHOL 141 11/18/2019 0925   TRIG 69 11/18/2019 0925   HDL 62 11/18/2019 0925   CHOLHDL 2.3 11/18/2019 0925   CHOLHDL 5.0 03/22/2011 1630   VLDL 39 03/22/2011 1630   LDLCALC 65 11/18/2019 0925     Assessment and Plan:  Low-flow low gradient severe AS and moderate to severe AR in the June 2025: Underwent radiation for Hodgkin's disease in the past.  Symptomatic with angina, DOE, exertional dizziness x 1 year.  RHC/LHC showed severe native vessel CAD, patent bypass grafts, RA pressure 11 mmHg, RV pressure 65/22 with end-diastolic pressure of 10 mmHg, PA pressure 53/36 with a mean of 36 mmHg, wedge pressure mean of 20 mmHg with V wave to 22 mmHg.  Initially on p.o. Lasix  20 mg once daily, switched to twice daily after LHC/RHC findings.  Evaluated by structural cardiology and CTS team, scheduled for TAVR on 07/08/2024.  Due to baseline bifascicular block on EKG, he will need R IJ pacing during TAVR procedure.  Severe MS in June 2025: Continue p.o. Lasix  20 mg twice daily.  DOE getting better.  Currently not on BB.  After he undergoes TAVR, he will need to be started on BB with a goal HR 60 bpm. RHC showed, RA pressure 11 mmHg, RV pressure 65/22 with end-diastolic pressure of 10 mmHg, PA pressure 53/36 with a mean of 36 mmHg, wedge pressure mean of 20 mmHg with V wave to 22 mmHg.    Chronic systolic heart failure: Echocardiogram showed LVEF 40 to 45%, mildly reduced RV systolic  function, moderate RV dilatation.  Not on BB, will need to add after TAVR.  Need to optimize his GDMT, can be done after his TAVR but hopefully his LVEF will improve after his TAVR.  CAD s/p CABG (LIMA to LAD, SVG to RCA, L radial to ramus) in 2014: LHC showed patent grafts.  Continue aspirin  81 mg once daily, simvastatin  80 mg nightly.  Carotid artery stenosis: Occluded right carotid with mild to moderate left carotid stenosis, follows with vascular surgery.  HLD, unknown values: Continue  simvastatin  80 mg nightly, goal LDL less than 55.   30 minutes spent in remainder prior records, imaging, test/reports, discussion of the above problems with the patient, documentation and answering all his questions.  Medication Adjustments/Labs and Tests Ordered: Current medicines are reviewed at length with the patient today.  Concerns regarding medicines are outlined above.    Disposition:  Follow up 6 months  Signed Icela Glymph Priya Lasean Gorniak, MD, 06/30/2024 12:36 PM    [facility] [address] [place], [place], [place] [address]

## 2024-07-04 ENCOUNTER — Ambulatory Visit: Payer: Self-pay | Admitting: Physician Assistant

## 2024-07-04 ENCOUNTER — Ambulatory Visit (HOSPITAL_COMMUNITY)
Admission: RE | Admit: 2024-07-04 | Discharge: 2024-07-04 | Disposition: A | Discharge status: Home / Self Care | Source: Ambulatory Visit | Attending: Internal Medicine | Admitting: Internal Medicine

## 2024-07-04 ENCOUNTER — Encounter (HOSPITAL_COMMUNITY)
Admission: RE | Admit: 2024-07-04 | Discharge: 2024-07-04 | Disposition: A | Discharge status: Home / Self Care | Source: Ambulatory Visit | Attending: Internal Medicine | Admitting: Internal Medicine

## 2024-07-04 ENCOUNTER — Other Ambulatory Visit: Payer: Self-pay

## 2024-07-04 DIAGNOSIS — R9389 Abnormal findings on diagnostic imaging of other specified body structures: Secondary | ICD-10-CM | POA: Diagnosis not present

## 2024-07-04 DIAGNOSIS — I350 Nonrheumatic aortic (valve) stenosis: Secondary | ICD-10-CM | POA: Insufficient documentation

## 2024-07-04 DIAGNOSIS — I452 Bifascicular block: Secondary | ICD-10-CM | POA: Insufficient documentation

## 2024-07-04 DIAGNOSIS — Z01818 Encounter for other preprocedural examination: Secondary | ICD-10-CM | POA: Diagnosis not present

## 2024-07-04 DIAGNOSIS — I700 Atherosclerosis of aorta: Secondary | ICD-10-CM | POA: Diagnosis not present

## 2024-07-04 LAB — COMPREHENSIVE METABOLIC PANEL WITH GFR
ALT: 34 U/L (ref 0–44)
AST: 41 U/L (ref 15–41)
Albumin: 4 g/dL (ref 3.5–5.0)
Alkaline Phosphatase: 56 U/L (ref 38–126)
Anion gap: 12 (ref 5–15)
BUN: 29 mg/dL — ABNORMAL HIGH (ref 8–23)
CO2: 27 mmol/L (ref 22–32)
Calcium: 9 mg/dL (ref 8.9–10.3)
Chloride: 99 mmol/L (ref 98–111)
Creatinine, Ser: 2.21 mg/dL — ABNORMAL HIGH (ref 0.61–1.24)
GFR, Estimated: 30 mL/min — ABNORMAL LOW (ref >60)
Glucose, Bld: 80 mg/dL (ref 70–99)
Potassium: 3.4 mmol/L — ABNORMAL LOW (ref 3.5–5.1)
Sodium: 138 mmol/L (ref 135–145)
Total Bilirubin: 1.2 mg/dL (ref 0.0–1.2)
Total Protein: 6.8 g/dL (ref 6.5–8.1)

## 2024-07-04 LAB — URINALYSIS, ROUTINE W REFLEX MICROSCOPIC
Bilirubin Urine: NEGATIVE
Glucose, UA: NEGATIVE mg/dL
Hgb urine dipstick: NEGATIVE
Ketones, ur: NEGATIVE mg/dL
Leukocytes,Ua: NEGATIVE
Nitrite: NEGATIVE
Protein, ur: NEGATIVE mg/dL
Specific Gravity, Urine: 1.013 (ref 1.005–1.030)
pH: 5 (ref 5.0–8.0)

## 2024-07-04 LAB — TYPE AND SCREEN
ABO/RH(D): A POS
Antibody Screen: NEGATIVE

## 2024-07-04 LAB — CBC
HCT: 41.1 % (ref 39.0–52.0)
Hemoglobin: 13.2 g/dL (ref 13.0–17.0)
MCH: 29.4 pg (ref 26.0–34.0)
MCHC: 32.1 g/dL (ref 30.0–36.0)
MCV: 91.5 fL (ref 80.0–100.0)
Platelets: 223 K/uL (ref 150–400)
RBC: 4.49 MIL/uL (ref 4.22–5.81)
RDW: 14.4 % (ref 11.5–15.5)
WBC: 11.3 K/uL — ABNORMAL HIGH (ref 4.0–10.5)
nRBC: 0 % (ref 0.0–0.2)

## 2024-07-04 LAB — PROTIME-INR
INR: 1.1 (ref 0.8–1.2)
Prothrombin Time: 14.5 s (ref 11.4–15.2)

## 2024-07-04 LAB — SURGICAL PCR SCREEN
MRSA, PCR: NEGATIVE
Staphylococcus aureus: NEGATIVE

## 2024-07-04 NOTE — Progress Notes (Signed)
 All consents signed by patient at PAT lab appointment. Pt was sent home with printed copy of surgical instructions and CHG soap/CHG soap instructions. All instructions reviewed with patient and questions answered.  Patients chart send to anesthesia for review. Pt denies any respiratory illness/infection in the last two months.

## 2024-07-07 ENCOUNTER — Other Ambulatory Visit: Payer: Self-pay

## 2024-07-07 DIAGNOSIS — I350 Nonrheumatic aortic (valve) stenosis: Secondary | ICD-10-CM

## 2024-07-07 MED ORDER — NOREPINEPHRINE 4 MG/250ML-% IV SOLN
0.0000 ug/min | INTRAVENOUS | Status: DC
  Filled 2024-07-07: qty 250

## 2024-07-07 MED ORDER — CEFAZOLIN SODIUM-DEXTROSE 2-4 GM/100ML-% IV SOLN
2.0000 g | INTRAVENOUS | Status: AC
Start: 2024-07-08 — End: 2024-07-09
  Administered 2024-07-08: 2 g via INTRAVENOUS
  Filled 2024-07-07: qty 100

## 2024-07-07 MED ORDER — MAGNESIUM SULFATE 50 % IJ SOLN
40.0000 meq | INTRAMUSCULAR | Status: DC
Start: 2024-07-08 — End: 2024-07-09
  Filled 2024-07-07: qty 9.85

## 2024-07-07 MED ORDER — POTASSIUM CHLORIDE 2 MEQ/ML IV SOLN
80.0000 meq | INTRAVENOUS | Status: DC
  Filled 2024-07-07: qty 40

## 2024-07-07 MED ORDER — HEPARIN 30,000 UNITS/1000 ML (OHS) CELLSAVER SOLUTION
Status: DC
  Filled 2024-07-07: qty 1000

## 2024-07-07 MED ORDER — DEXMEDETOMIDINE HCL IN NACL 400 MCG/100ML IV SOLN
0.1000 ug/kg/h | INTRAVENOUS | Status: AC
  Administered 2024-07-08: 90.6 ug via INTRAVENOUS
  Administered 2024-07-08: 1 ug/kg/h via INTRAVENOUS
  Filled 2024-07-07: qty 100

## 2024-07-07 NOTE — H&P (Addendum)
 [address] [place] [address]             [phone]     CARDIOTHORACIC SURGERY ADMISSION HISTORY AND PHYSICAL   PCP is Shona Norleen PEDLAR, MD Referring Provider is Lurena Red, MD Primary Cardiologist is Vishnu P Mallipeddi, MD   Reason for admission:  Severe aortic stenosis   HPI:   The patient is a 73 year old gentleman with a history of hyperlipidemia, asthma, stage IIIa chronic kidney disease, Hodgkin's lymphoma status post mantle XRT in 1983, stage II infiltrating ductal breast carcinoma status post right mastectomy in 2012 and tamoxifen  therapy, coronary artery disease status post CABG x 3 by Dr. Obadiah in 2014 with a LIMA to the LAD, left radial graft to the ramus, and saphenous vein graft to the RCA, and severe aortic stenosis referred for consideration of TAVR.  Over the past 6 to 12 months he has developed worsening exertional shortness of breath and fatigue as well as some episodes of dizziness.  He was started on Symbicort  inhaler for possible bronchial asthma with some improvement in his breathing.  He denies any chest pain or pressure.  He has had lower extremity edema which is improved with Lasix .  A 2D echocardiogram on 04/03/2024 showed a thickened and calcified aortic valve with restricted leaflet mobility.  The mean gradient was 23 mmHg with a valve area of 1.09 cm.  There was moderate to severe eccentric aortic insufficiency with a pressure half-time of 285 ms.  Left ventricular ejection fraction was 40 to 45%.  There was severe mitral annular calcification with no MR but severe mitral valve stenosis with a mean gradient of 11.3 mmHg.   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       Past Medical History:  Diagnosis Date   Adie's pupil     Anemia 07/14/2013   Borderline diabetes     CAD (coronary artery disease), native coronary artery 04/09/2013     Dist LM stenosis 70-80% (at trifurcation into LAD, CX, & 2 Ramus branches, 70% mid RCA);; b) Myoview  08/06/14: Low Risk, no Ischemia/infarction   Depression 1983    after I was dx'd w/CA; now just comes in spells  (12/22/2015)   Essential hypertension     Exertional shortness of breath     Hodgkin's lymphoma (HCC) 1983   Hypercholesterolemia     Hypothyroidism     Invasive ductal carcinoma of breast, stage 2 (HCC) 04/27/2011    chemo, mastectomy   Left carotid artery partial occlusion 04/27/2011    CAROTIC DOPPLER 11/2013: < 40% R ICA stenosis, distal waveforms damped - suggests probably intracranial occlusoin, < 40% LICA, normal vertebrals -- no change   Mild aortic stenosis by prior echocardiogram 02/2018    By echo June 2020: Mild-Mod AS (Mean Gradient 13.5 mmHg)   Mild mitral stenosis by prior echocardiogram 08/06/2014    Most recent echo June 2020: Estimated valve area 2.83 cm (in 2019 was roughly 2 cm).  Mean gradient 6-8 mmHg.  Appears to be stable.   Personal history of chemotherapy     Pre-diabetes     Restless leg syndrome     Right carotid artery occlusion 04/27/2011   S/P CABG x 3 04/09/2013    LIMA-LAD, fLRAD-RI, SVG-RCA; Echo 10/29/'15:  mild Conc LVH, no WMA, Gr 1 DD, Mod AoV Sclerosis w/ Mod AI, ~Mild-Mod MS  Sleep apnea     Stroke [facility])      2007   Thyroid  disease                 Past Surgical History:  Procedure Laterality Date   BIOPSY   09/08/2020    Procedure: BIOPSY;  Surgeon: Golda Claudis PENNER, MD;  Location: [facility];  Service: Endoscopy;;  colon   BIOPSY   12/09/2021    Procedure: BIOPSY;  Surgeon: Eartha Angelia Sieving, MD;  Location: [facility];  Service: Gastroenterology;;   BREAST BIOPSY Right 2012   CARDIAC CATHETERIZATION   2014   CATARACT EXTRACTION W/PHACO Right 06/21/2015    Procedure: CATARACT EXTRACTION PHACO AND INTRAOCULAR LENS PLACEMENT (IOC);  Surgeon: Cherene Mania, MD;  Location: [facility];  Service: Ophthalmology;   Laterality: Right;  CDE: 7.05   CATARACT EXTRACTION W/PHACO Left 07/01/2015    Procedure: CATARACT EXTRACTION PHACO AND INTRAOCULAR LENS PLACEMENT (IOC);  Surgeon: Cherene Mania, MD;  Location: [facility];  Service: Ophthalmology;  Laterality: Left;  CDE: 7.49   COLONOSCOPY [name] 05/06/2015    Procedure: COLONOSCOPY;  Surgeon: Claudis PENNER Golda, MD;  [facility]: [facility];  Service: Endoscopy;  Laterality: [name];  930   COLONOSCOPY WITH PROPOFOL  [name] 09/08/2020    Procedure: COLONOSCOPY WITH PROPOFOL ;  Surgeon: Golda Claudis PENNER, MD;  [facility];  Service: Endoscopy;  Laterality: [name];  915   CORONARY ARTERY BYPASS GRAFT [name] 04/08/2013    Procedure: CORONARY ARTERY BYPASS GRAFTING (CABG);  Surgeon: Maude Fleeta Ochoa, MD;  Location: [facility] OR;  Service: Open Heart Surgery;  Laterality: [name];  Times 3 using left internal mammary artery; endoscopically harvested right saphenous vein and left radial artery   ESOPHAGOGASTRODUODENOSCOPY (EGD) WITH PROPOFOL  [name] 12/09/2021    Procedure: ESOPHAGOGASTRODUODENOSCOPY (EGD) WITH PROPOFOL ;  Surgeon: Eartha Angelia Sieving, MD;  [facility];  Service: Gastroenterology;  Laterality: [name];  1250 ASA 2   INTRAOPERATIVE TRANSESOPHAGEAL ECHOCARDIOGRAM [name] 04/08/2013    Procedure: INTRAOPERATIVE TRANSESOPHAGEAL ECHOCARDIOGRAM;  Surgeon: Maude Fleeta Ochoa, MD;  Location: [facility] OR;  Service: Open Heart Surgery;  Laterality: [name];   LAPAROSCOPIC APPENDECTOMY [name] 12/23/2015    Procedure: APPENDECTOMY LAPAROSCOPIC;  Surgeon: Donnice Lima, MD;  Location: [facility] OR;  Service: General;  Laterality: [name];   LEFT HEART CATHETERIZATION WITH CORONARY ANGIOGRAM [name] 04/07/2013    Procedure: LEFT HEART CATHETERIZATION WITH CORONARY ANGIOGRAM;  Surgeon: Alm LELON Clay, MD;  Location: [facility] CATH LAB;  Service: Cardiovascular;  Laterality: [name];   LYMPH NODE BIOPSY Left 1983    neck   MASTECTOMY Right 2012    w/axillary lymph node dissection   NM MYOVIEW  LTD   08/2019     EF 55 to 60%.   LOW RISK.  No ischemia or infarction noted.   NM TREADMILL MYOVIEW  LTD   08/06/2014    Exercise 5:07 min, 6.3 METS; symptoms noted or extreme dyspnea and lightheadedness with some chest tightness. No EKG changes. No ischemia or infarction was normal EF.   POLYPECTOMY   09/08/2020    Procedure: POLYPECTOMY;  Surgeon: Golda Claudis PENNER, MD;  [facility];  Service: Endoscopy;;   RADIAL ARTERY HARVEST Left 04/08/2013    Procedure: RADIAL ARTERY HARVEST;  Surgeon: Maude Fleeta Ochoa, MD;  Location[facility] OR;  Service: Open Heart Surgery;  Laterality: Left;   RIGHT HEART CATH AND CORONARY ANGIOGRAPHY [name] 05/20/2024    Procedure: RIGHT HEART CATH AND CORONARY ANGIOGRAPHY;  Surgeon: Wendel Lurena POUR, MD;  Location:  [facility]VAS[facility] CV LAB;  Service: Cardiovascular;  Laterality: [name];   SAVORY DILATION   12/09/2021    Procedure: SAVORY DILATION;  Surgeon: Eartha Angelia Sieving, MD;  [facility];  Service: Gastroenterology;;   TONSILLECTOMY   1974   TRANSTHORACIC ECHOCARDIOGRAM   08/06/2014; 02/2018    a) Nl EF 55-60%. Gr 1 DD-high LVEDP. Mod AoV Sclerosis w/o AS. Mild AI; mild MS - mean grad 7 mmHg @ HR 122 bpm.;; b) Initial: EF 55-60%, GRII DD.  HighLAP/LVEDP.  ? Bicuspid AoV -severely Ca2+.  Mild Ao root dilation.  Severe MAC, mild MS (MVA by P1/2 T & continuity equation ~2 cm).  Mod RV dilation w/ mild- mod reduced EF.---> Limited w/Defiinity: No RWMA, Mild AS, Mod MS (~mean gradient 8 mmHg   TRANSTHORACIC ECHOCARDIOGRAM   02/2020    Normal LV size and function.  EF 65 to 70% no R WMA.  Mild LVH with basal septum.  GRII DD with elevated LVEDP.  Moderate reduced RV function with mild RV dilation, but normal PA pressures.  Mild LA dilation.  Moderate mitral stenosis, moderate AS & AI.  Mild MR, Mod MS (mean gradient 9 mmHg).  Moderate calcified AS w/ Mod AI -> Mean gradient 17.3 mmHg with peak 32 mm.   TRANSTHORACIC ECHOCARDIOGRAM   03/2019     EF 60-65%, mild concentric LVH. Gr 2 DD. No RWMA.  Mild-Mod RV dilation & enlargement. Mild LA dilation. Mod MV Calcification . Mod MS (previously read as mild, but mean gradient is lower than and estimated valve area is higher than last check).  Mild-Mod AS (Mean Gradient 13.5 mmHg)               Family History  Problem Relation Age of Onset   Alzheimer's disease Mother     Stroke Mother     Hypertension Mother     Hyperlipidemia Mother     Diabetes Mother     Hypertension Father     Diabetes Paternal Grandmother     Diabetes Paternal Grandfather     Breast cancer Paternal Aunt     Ovarian cancer Paternal Aunt            Social History         Socioeconomic History   Marital status: Married      Spouse name: Not on file   Number of children: 2   Years of education: Not on file   Highest education level: Not on file  Occupational History   Not on file  Tobacco Use   Smoking status: Former      Current packs/day: 0.00      Average packs/day: 1 pack/day for 15.0 years (15.0 ttl pk-yrs)      Types: Cigarettes      Start date: 06/10/1967      Quit date: 06/09/1982      Years since quitting: 42.0   Smokeless tobacco: Never  Vaping Use   Vaping status: Never Used  Substance and Sexual Activity   Alcohol use: No   Drug use: No   Sexual activity: Never  Other Topics Concern   Not on file  Social History Narrative    Married. Former smoker who quit in 1983.    No routine exercise - gained ~40lb post CABG.    Social Drivers of Manufacturing engineer Strain: Not on file  Food Insecurity: Not on file  Transportation Needs: Not on file  Physical Activity: Not on  file  Stress: Not on file  Social Connections: Not on file  Intimate Partner Violence: Not on file             Prior to Admission medications   Medication Sig Start Date End Date Taking? Authorizing Provider  acetaminophen  (TYLENOL ) 500 MG tablet Take 500 mg by mouth every 6 (six) hours as needed for mild pain.      Yes [provider]   albuterol  (VENTOLIN  HFA) 108 (90 Base) MCG/ACT inhaler Inhale 2 puffs into the lungs as needed for wheezing or shortness of breath. 01/29/24   Yes [name], MD  ALPRAZolam  (XANAX ) 1 MG tablet Take 1 mg by mouth at bedtime.     Yes [provider]  Artificial Tear Solution (SOOTHE XP OP) Place 1 drop into both eyes daily as needed (dry eyes).     Yes [provider]  aspirin  EC 81 MG tablet Take 81 mg by mouth every evening. 09/09/20   Yes Rehman, Claudis PENNER, MD  b complex vitamins capsule Take 1 capsule by mouth daily.     Yes [provider]  busPIRone (BUSPAR) 5 MG tablet Take 5 mg by mouth daily. 05/06/24   Yes [provider]  Cholecalciferol (VITAMIN D3) 50 MCG (2000 UT) capsule Take 2,000 Units by mouth daily.     Yes [provider]  citalopram  (CELEXA ) 20 MG tablet Take 20 mg by mouth 2 (two) times daily. 11/28/23   Yes [provider]  docusate sodium  (COLACE) 100 MG capsule Take 100 mg by mouth daily as needed for moderate constipation.     Yes [provider]  famotidine  (PEPCID ) 20 MG tablet One after supper 04/16/24   Yes [name], MD  folic acid  (FOLVITE ) 400 MCG tablet Take 400 mcg by mouth daily.     Yes [provider]  furosemide  (LASIX ) 20 MG tablet Take 1 tablet (20 mg total) by mouth 2 (two) times daily. 05/20/24   Yes Thukkani, Arun K, MD  Homeopathic Products (RESTFUL LEGS SL) Place 1 tablet under the tongue 2 (two) times daily.     Yes [provider]  levothyroxine  (SYNTHROID ) 75 MCG tablet Take 75 mcg by mouth at bedtime. 10/31/23   Yes [provider]  Magnesium  500 MG TABS Take 500 mg by mouth 3 (three) times a week.     Yes [provider]  Menthol-Methyl Salicylate (MUSCLE RUB) 10-15 % CREA Apply 1 application topically as needed for muscle pain.     Yes [provider]  metoprolol  tartrate (LOPRESSOR ) 50 MG tablet Take one tablet by mouth as directed prior  to CT scan 05/20/24   Yes Thukkani, Arun K, MD  nitroGLYCERIN  (NITROSTAT ) 0.4 MG SL tablet Place 1 tablet (0.4 mg total) under the tongue every 5 (five) minutes as needed for chest pain. 12/04/19   Yes [name], MD  OVER THE COUNTER MEDICATION Take 2 tablets by mouth daily. Ultra kidney complex     Yes [provider]  OVER THE COUNTER MEDICATION Take 1 tablet by mouth daily as needed (constipation). Herbal laxative otc supplement     Yes [provider]  pantoprazole  (PROTONIX ) 40 MG tablet Take 1 tablet (40 mg total) by mouth daily. Take 30-60 min before first meal of the day 04/16/24   Yes [name], MD  pramipexole (MIRAPEX) 0.5 MG tablet Take 0.5 mg by mouth daily. 12/02/23   Yes [provider]  simvastatin  (ZOCOR ) 80 MG tablet Take 80 mg by mouth every evening.  10/13/19   Yes [provider]  SYMBICORT  80-4.5 MCG/ACT inhaler Inhale 2 puffs into the lungs 2 (two) times daily. 02/21/24   Yes [provider]  TURMERIC PO Take 800 mg by mouth daily.     Yes [provider]  Potassium 99 MG TABS Take 99 mg by mouth daily as needed (leg cramps). Patient not taking: Reported on 06/25/2024       [provider]            Current Outpatient Medications  Medication Sig Dispense Refill   acetaminophen  (TYLENOL ) 500 MG tablet Take 500 mg by mouth every 6 (six) hours as needed for mild pain.        albuterol  (VENTOLIN  HFA) 108 (90 Base) MCG/ACT inhaler Inhale 2 puffs into the lungs as needed for wheezing or shortness of breath. 18 each 2   ALPRAZolam  (XANAX ) 1 MG tablet Take 1 mg by mouth at bedtime.       Artificial Tear Solution (SOOTHE XP OP) Place 1 drop into both eyes daily as needed (dry eyes).       aspirin  EC 81 MG tablet Take 81 mg by mouth every evening. 90 tablet 3   b complex vitamins capsule Take 1 capsule by mouth daily.       busPIRone (BUSPAR) 5 MG tablet Take 5 mg by mouth daily.       Cholecalciferol (VITAMIN D3)  50 MCG (2000 UT) capsule Take 2,000 Units by mouth daily.       citalopram  (CELEXA ) 20 MG tablet Take 20 mg by mouth 2 (two) times daily.       docusate sodium  (COLACE) 100 MG capsule Take 100 mg by mouth daily as needed for moderate constipation.       famotidine  (PEPCID ) 20 MG tablet One after supper 30 tablet 11   folic acid  (FOLVITE ) 400 MCG tablet Take 400 mcg by mouth daily.       furosemide  (LASIX ) 20 MG tablet Take 1 tablet (20 mg total) by mouth 2 (two) times daily. 30 tablet 5   Homeopathic Products (RESTFUL LEGS SL) Place 1 tablet under the tongue 2 (two) times daily.       levothyroxine  (SYNTHROID ) 75 MCG tablet Take 75 mcg by mouth at bedtime.       Magnesium  500 MG TABS Take 500 mg by mouth 3 (three) times a week.       Menthol-Methyl Salicylate (MUSCLE RUB) 10-15 % CREA Apply 1 application topically as needed for muscle pain.       metoprolol  tartrate (LOPRESSOR ) 50 MG tablet Take one tablet by mouth as directed prior to CT scan 1 tablet 0   nitroGLYCERIN  (NITROSTAT ) 0.4 MG SL tablet Place 1 tablet (0.4 mg total) under the tongue every 5 (five) minutes as needed for chest pain. 25 tablet 12   OVER THE COUNTER MEDICATION Take 2 tablets by mouth daily. Ultra kidney complex       OVER THE COUNTER MEDICATION Take 1 tablet by mouth daily as needed (constipation). Herbal laxative otc supplement       pantoprazole  (PROTONIX ) 40 MG tablet Take 1 tablet (40 mg total) by mouth daily. Take 30-60 min before first meal of the day 30 tablet 2   pramipexole (MIRAPEX) 0.5 MG tablet Take 0.5 mg by mouth daily.       simvastatin  (ZOCOR ) 80 MG tablet Take 80 mg by mouth  every evening.        SYMBICORT  80-4.5 MCG/ACT inhaler Inhale 2 puffs into the lungs 2 (two) times daily.       TURMERIC PO Take 800 mg by mouth daily.       Potassium 99 MG TABS Take 99 mg by mouth daily as needed (leg cramps). (Patient not taking: Reported on 06/25/2024)          No current facility-administered medications for  this visit.        Allergies  No Known Allergies         Review of Systems:               General:                      + decreased appetite, + decreased energy, no weight gain, + weight loss, no fever             Cardiac:                       no chest pain with exertion, no chest pain at rest, +SOB with mild exertion, no resting SOB, no PND, no orthopnea, no palpitations, no arrhythmia, no atrial fibrillation, + LE edema, + dizzy spells, no syncope             Respiratory:                 + exertional shortness of breath, no home oxygen, no productive cough, no dry cough, no bronchitis, no wheezing, no hemoptysis, + asthma, no pain with inspiration or cough, no sleep apnea, no CPAP at night             GI:                               no difficulty swallowing, no reflux, no frequent heartburn, no hiatal hernia, no abdominal pain, no constipation, no diarrhea, no hematochezia, no hematemesis, no melena             GU:                              no dysuria,  no frequency, no urinary tract infection, no hematuria, no enlarged prostate, no kidney stones, + chronic kidney disease             Vascular:                     no pain suggestive of claudication, no pain in feet, no leg cramps, no varicose veins, no DVT, no non-healing foot ulcer             Neuro:                         no stroke, no TIA's, no seizures, no headaches, no temporary blindness one eye,  no slurred speech, no peripheral neuropathy, no chronic pain, no instability of gait, no memory/cognitive dysfunction             Musculoskeletal:         no arthritis,  joint swelling, no myalgias, no difficulty walking, normal mobility              Skin:  no rash, no itching, no skin infections, no pressure sores or ulcerations             Psych:                         no anxiety, no depression, no nervousness, no unusual recent stress             Eyes:                           no blurry vision, no floaters,  no recent vision changes, no glasses or contacts             ENT:                            no hearing loss, no loose or painful teeth, + dentures              Hematologic:               no easy bruising, no abnormal bleeding, no clotting disorder, no frequent epistaxis             Endocrine:                   no diabetes, does not check CBG's at home                            Physical Exam:               BP (!) 127/53 (BP Location: Left Arm)   Pulse 80   Resp 18   Ht 6' 3 (1.905 m)   Wt 201 lb (91.2 kg)   SpO2 95% Comment: RA  BMI 25.12 kg/m              General:                      Thin, well-appearing             HEENT:                       Unremarkable, NCAT, PERLA, EOMI             Neck:                           no JVD, no bruits, no adenopathy              Chest:                          clear to auscultation, symmetrical breath sounds, no wheezes, no rhonchi              CV:                              RRR, 3/6 systolic murmur RSB, soft S2 with slight diastolic murmur              Abdomen:                    soft, non-tender, no masses              Extremities:  warm, well-perfused, pedal pulses palpable, no lower extremity edema             Rectal/GU                   Deferred             Neuro:                         Grossly non-focal and symmetrical throughout             Skin:                            Clean and dry, no rashes, no breakdown   Diagnostic Tests:   ECHOCARDIOGRAM REPORT       Patient Name:   TAVAUGHN SILGUERO Date of Exam: 04/03/2024 Medical Rec #:  [id number]        Height:       75.0 in Accession #:    [id number]       Weight:       215.0 lb Date of Birth:  Nov 20, 1950        BSA:          2.263 m Patient Age:    72 years         BP:           138/60 mmHg Patient Gender: M                HR:           75 bpm. Exam Location:  [place]  Procedure: 2D Echo, Cardiac Doppler and Color Doppler (Both Spectral and Color            Flow  Doppler were utilized during procedure).  Indications:    I35.0 Nonrheumatic aortic (valve) stenosis; R06.9 DOE   History:        Patient has prior history of Echocardiogram examinations, most                 recent 02/18/2020. CAD, Prior CABG, Stroke and Carotid Disease,                 Aortic Valve Disease and Mitral Valve Disease,                 Signs/Symptoms:Murmur and Dyspnea; Risk Factors:Hypertension and                 Dyslipidemia. Breast cancer                 Hodgkin's Lymphoma.   Sonographer:    Bascom Burows RCS, RVS Referring Phys: [id number] VISHNU P MALLIPEDDI    Sonographer Comments: Suboptimal subcostal window. IMPRESSIONS    1. Left ventricular ejection fraction, by estimation, is 40 to 45%. Left ventricular ejection fraction by 3D volume is 46 %. The left ventricle has mildly decreased function. The left ventricle has no regional wall motion abnormalities. Left ventricular  diastolic parameters are indeterminate.  2. Right ventricular systolic function is mildly reduced. The right ventricular size is moderately enlarged.  3. Left atrial size was mildly dilated.  4. The mitral valve is abnormal. No evidence of mitral valve regurgitation. Severe mitral stenosis. The mean mitral valve gradient is 11.3 mmHg. Severe mitral annular calcification.  5. Aortic dilatation noted. There is moderate dilatation of the aortic root, measuring 44 mm.  6. The aortic valve has an indeterminant  number of cusps. There is moderate calcification of the aortic valve. There is moderate thickening of the aortic valve. There is severe restriction in the opening of aortic valve leaflets. Aortic valve regurgitation is moderate to severe, eccentric jet. Severity of aortic regurgitation is likely underestimated due to the eccentric nature of the jet. There is low-flow, low-gradient severe aortic valve stenosis. Aortic regurgitation PHT measures 285 msec. Aortic valve area, by VTI  measures 1.09 cm. Aortic valve mean gradient measures 23.0 mmHg. Aortic valve Vmax measures 3.21 m/s.  Comparison(s): A prior study was performed on 02/18/2020. EF 65-70%. Right ventricular systolic function moderately reduced. The RV size is mildly dilated. Normal PASP. Moderate mitral valve stenosis-mean pg 9.0 mmHg. Mild MR. Moderate aortic valve stenosis-mean pg 17.3 mmHg. Moderate AI.  FINDINGS  Left Ventricle: Left ventricular ejection fraction, by estimation, is 40 to 45%. Left ventricular ejection fraction by 3D volume is 46 %. The left ventricle has mildly decreased function. The left ventricle has no regional wall motion abnormalities. Strain was performed and the global longitudinal strain is indeterminate. The left ventricular internal cavity size was normal in size. There is no left ventricular hypertrophy. Left ventricular diastolic parameters are indeterminate.  Right Ventricle: The right ventricular size is moderately enlarged. No increase in right ventricular wall thickness. Right ventricular systolic function is mildly reduced.  Left Atrium: Left atrial size was mildly dilated.  Right Atrium: Right atrial size was normal in size.  Pericardium: There is no evidence of pericardial effusion.  Mitral Valve: The mitral valve is abnormal. Severe mitral annular calcification. No evidence of mitral valve regurgitation. Severe mitral valve stenosis. MV peak gradient, 26.4 mmHg. The mean mitral valve gradient is 11.3 mmHg.  Tricuspid Valve: The tricuspid valve is normal in structure. Tricuspid valve regurgitation is mild . No evidence of tricuspid stenosis.  Aortic Valve: The aortic valve has an indeterminant number of cusps. There is moderate calcification of the aortic valve. There is moderate thickening of the aortic valve. Aortic valve regurgitation is moderate to severe. Aortic regurgitation PHT measures 285 msec. Moderate aortic stenosis is present. Aortic valve  mean gradient measures 23.0 mmHg. Aortic valve peak gradient measures 41.2 mmHg. Aortic valve area, by VTI measures 1.09 cm.  Pulmonic Valve: The pulmonic valve was normal in structure. Pulmonic valve regurgitation is trivial. No evidence of pulmonic stenosis.  Aorta: Aortic dilatation noted. There is moderate dilatation of the aortic root, measuring 44 mm.  Venous: The inferior vena cava was not well visualized.  IAS/Shunts: No atrial level shunt detected by color flow Doppler.  Additional Comments: 3D was performed not requiring image post processing on an independent workstation and was abnormal.    LEFT VENTRICLE PLAX 2D LVIDd:         4.30 cm LVIDs:         3.70 cm LV PW:         1.10 cm         3D Volume EF LV IVS:        1.00 cm         LV 3D EF:    Left LVOT diam:     2.10 cm                      ventricul LV SV:         74  ar LV SV Index:   33                           ejection LVOT Area:     3.46 cm                     fraction                                             by 3D                                             volume is LV Volumes (MOD)                            46 %. LV vol d, MOD    61.5 ml A2C: LV vol d, MOD    94.8 ml       3D Volume EF: A4C:                           3D EF:        46 % LV vol s, MOD    40.8 ml A2C: LV vol s, MOD    55.2 ml A4C: LV SV MOD A2C:   20.7 ml LV SV MOD A4C:   94.8 ml LV SV MOD BP:    32.5 ml  RIGHT VENTRICLE RV Basal diam:  4.60 cm RV Mid diam:    4.80 cm RV S prime:     5.44 cm/s TAPSE (M-mode): 1.2 cm  LEFT ATRIUM              Index        RIGHT ATRIUM           Index LA diam:        4.30 cm  1.90 cm/m   RA Area:     16.40 cm LA Vol (A2C):   102.0 ml 45.08 ml/m  RA Volume:   42.80 ml  18.91 ml/m LA Vol (A4C):   68.1 ml  30.09 ml/m LA Biplane Vol: 85.4 ml  37.74 ml/m  AORTIC VALVE                     PULMONIC VALVE AV Area (Vmax):    1.14 cm      PV Vmax:       0.68 m/s AV  Area (Vmean):   1.11 cm      PV Peak grad:  1.9 mmHg AV Area (VTI):     1.09 cm AV Vmax:           321.00 cm/s AV Vmean:          225.500 cm/s AV VTI:            0.676 m AV Peak Grad:      41.2 mmHg AV Mean Grad:      23.0 mmHg LVOT Vmax:         105.50 cm/s LVOT Vmean:        72.450 cm/s LVOT VTI:          0.214 m  LVOT/AV VTI ratio: 0.32 AI PHT:            285 msec AR Vena Contracta: 1.00 cm   AORTA Ao Root diam: 4.40 cm Ao Asc diam:  3.50 cm  MITRAL VALVE               TRICUSPID VALVE MV Area (PHT): 2.22 cm    TR Peak grad:   41.0 mmHg MV Area VTI:   1.07 cm    TR Vmax:        320.00 cm/s MV Peak grad:  26.4 mmHg MV Mean grad:  11.3 mmHg   SHUNTS MV Vmax:       2.57 m/s    Systemic VTI:  0.21 m MV Vmean:      154.7 cm/s  Systemic Diam: 2.10 cm  Vishnu Priya Mallipeddi Electronically signed by Diannah Late Mallipeddi Signature Date/Time: 04/04/2024/4:47:13 PM       Final      Procedures   RIGHT HEART CATH AND CORONARY ANGIOGRAPHY    Conclusion       Mid LM to Dist LM lesion is 90% stenosed.   Ost LAD lesion is 80% stenosed.   1st Mrg lesion is 50% stenosed.   Prox RCA to Mid RCA lesion is 90% stenosed.   1.  Patent LIMA to LAD, left radial to ramus, and vein graft to RCA grafts. 2.  Severe native vessel disease. 3.  Fick cardiac output of 4.8 L/min and Fick cardiac index of 2.2 L/min/m with the following hemodynamics:            Right atrial pressure mean of 11 mmHg            Right ventricular pressure 65/22 with end-diastolic pressure of 10 mmHg            PA pressure 53/36 with a mean of 36 mmHg            Wedge pressure mean of 20 mmHg with V waves to 22 mmHg            PVR of 3.35            PA pulsatility index of 1.5 4.  Capacious iliofemoral vessels bilaterally   Recommendation: Continue evaluation for aortic valve intervention.  Increase Lasix  to 20 mg twice daily and check BMP in 1 week given elevated RA pressure and decreased PA pulsatility  index.   Procedural Details   Technical Details The patient is a 73 year old male with a history of severe aortic stenosis, mitral stenosis, bifascicular/trifascicular block, coronary artery disease status post CABG consisting of a LIMA to LAD, left radial to ramus, and vein graft to PDA, type 2 diabetes, hyperlipidemia, CKD stage IIIa, and carotid disease who was seen in the outpatient setting due to lifestyle limiting dyspnea.  He is referred for right heart catheterization and coronary angiography study as a preprocedural assessment prior to an aortic valve intervention.  After obtaining consent, the patient was brought to the cardiac catheterization laboratory and prepped draped sterile fashion.  Xylocaine  was used to anesthetize the right groin and ultrasound was used to gain access to the right common femoral artery.  A 6 French sheath was placed.  Ultrasound was also used to gain access to the right common femoral vein and a 5 French sheath was placed.  Coronary and bypass angiography was performed with standard 6 French JR4 and JL 4 diagnostic catheters.  A right heart catheterization was performed with a 5  [place] balloontipped catheter.  After review of the angiographic data and hemodynamic data, no further inventions were pursued.  Manual pressure will be applied to the access sites for hemostasis. Estimated blood loss <50 mL.   During this procedure medications were administered to achieve and maintain moderate conscious sedation while the patient's heart rate, blood pressure, and oxygen saturation were continuously monitored and I was present face-to-face 100% of this time. Bette Bunker Cardiovascular Specialist and Carrolyn Gals RN are independent, trained observers who assisted in the monitoring of the patient's level of consciousness.    Medications (Filter: Administrations occurring from 1010 to 1133 on 05/20/24) fentaNYL  (SUBLIMAZE ) injection (mcg)  Total dose: 25 mcg Date/Time  Rate/Dose/Volume Action    05/20/24 1034 25 mcg Given    midazolam  (VERSED ) injection (mg)  Total dose: 1 mg Date/Time Rate/Dose/Volume Action    05/20/24 1034 1 mg Given    lidocaine  (PF) (XYLOCAINE ) 1 % injection (mL)  Total volume: 5 mL Date/Time Rate/Dose/Volume Action    05/20/24 1046 5 mL Given    iohexol  (OMNIPAQUE ) 350 MG/ML injection (mL)  Total volume: 85 mL Date/Time Rate/Dose/Volume Action    05/20/24 1123 85 mL Given    Heparin  (Porcine) in NaCl 1000-0.9 UT/500ML-% SOLN (mL)  Total volume: 1,000 mL Date/Time Rate/Dose/Volume Action    05/20/24 1123 1,000 mL Given      Sedation Time   Sedation Time Physician-1: 47 minutes 9 seconds Contrast        Administrations occurring from 1010 to 1133 on 05/20/24:  Medication Name Total Dose  iohexol  (OMNIPAQUE ) 350 MG/ML injection 85 mL    Radiation/Fluoro   Fluoro time: 7.9 (min) DAP: 16.6 (Gycm2) Cumulative Air Kerma: 181.2 (mGy) Complications   Complications documented before study signed (05/20/2024 11:45 AM)    No complications were associated with this study.  Documented by Gals Carrolyn SAUNDERS, RN - 05/20/2024 11:23 AM      Coronary Findings   Diagnostic Dominance: Right Left Main  Mid LM to Dist LM lesion is 90% stenosed.    Left Anterior Descending  Ost LAD lesion is 80% stenosed.    Left Circumflex    First Obtuse Marginal Branch  1st Mrg lesion is 50% stenosed.    Right Coronary Artery  Prox RCA to Mid RCA lesion is 90% stenosed.    LIMA Graft To Dist LAD    Graft To Dist RCA    Graft To 1st Mrg    Intervention    No interventions have been documented.    Coronary Diagrams   Diagnostic Dominance: Right  Intervention    Implants    No implant documentation for this case.    Syngo Images    Show images for CARDIAC CATHETERIZATION Images on Long Term Storage    Show images for Kimothy, Kishimoto Link to Procedure Log   Procedure Log    Hemodynamics    Pressures Phases Resting  Right      RA Mean  mmHg 11    RA A-Wave  mmHg 17    RA V-Wave  mmHg 15  Pulmonary      PA  mmHg 53/36 (36)    PCW Mean  mmHg 20.0    PCW A-Wave  mmHg 20.0    PCW V-Wave  mmHg 22.0    PAPi   1.5    Saturations Phases Resting    PA  % 55    Arterial  % 91    Hemo Data  Flowsheet Row Most Recent Value  Fick Cardiac Output 4.77 L/min  Fick Cardiac Output Index 2.16 (L/min)/BSA  RA A Wave 17 mmHg  RA V Wave 15 mmHg  RA Mean 11 mmHg  RV Systolic Pressure 65 mmHg  RV Diastolic Pressure 22 mmHg  RV EDP 10 mmHg  PA Systolic Pressure 53 mmHg  PA Diastolic Pressure 36 mmHg  PA Mean 36 mmHg  PW A Wave 20 mmHg  PW V Wave 22 mmHg  PW Mean 20 mmHg  AO Systolic Pressure 128 mmHg  AO Diastolic Pressure 69 mmHg  AO Mean 88 mmHg  QP/QS 0.94  TPVR Index 17.63 HRUI  TSVR Index 40.69 HRUI  PVR SVR Ratio 0.22  TPVR/TSVR Ratio 0.43    Narrative & Impression  CLINICAL DATA:  Pre TAVR   MEDICATIONS: MEDICATIONS None   EXAM: Cardiac TAVR CT   TECHNIQUE: The patient was scanned on a GE apex scanner. 1 beat acquisition triggered in the descending thoracic aorta at 110 HU's. A non contrast, gated CT scan was obtained first with axial slices of 2.5 mm through the heart for valve scoring. A 120 kV retrospective, gated, contrast scan done with gantry rotation speed of 230 msec and collimation 0.63 mm. A delayed scan was obtained to exclude LAA thrombus. The 3D data set was reconstructed in 5% intervals of the R-R cycle. Best systolic phase was motion corrected Images were analyzed on a dedicated workstation using MPR, MIP and VRT modes The patient received 100 cc of contrast   FINDINGS: Aortic Valve: Tri cuspid calcified with score 4394   Aorta: No aneurysm.  Bovine Arch severe calcific atherosclerosis   Sinotubular Junction: 30.7 mm   Ascending Thoracic Aorta: 36 mm   Aortic Arch: 30.6 mm   Descending Thoracic Aorta: 23.6 mm   Sinus of  Valsalva Measurements:   Non-coronary: 40.5 mm Height 26.3 mm   Right - coronary: 39.5 mm  Height 21.7 mm   Left - coronary: 38.4 mm Height 22.4 mm   Coronary Artery Height above Annulus:   Left Main: 18 mm above annulus   Right Coronary: 20.5 mm above annulus   Virtual Basal Annulus Measurements:   Maximum/Minimum Diameter: 31 mm x 25 mm   Perimeter: 90 mm   Area: 626 mm2 extensive annular calcium  at base of left cusp   Coronary Arteries: Sufficient height above annulus for deployment. Patent LIMA to LAD, patient Radial to IM, and patent SVG to RCA   Optimum Fluoroscopic Angle for Delivery: LAO 3 Caudal 2 degrees   Severe MAC including entire intervalvular fibrosa below aortic annulus   Membranous septal length 5.4 mm   IMPRESSION: 1.  Calcified tri leaflet AV with score 4394   2. Annular area of 626 mm2 and perimeter 90 mm suitable for a 29 mm Sapien 3 or 34 mm Evolut valve   3. Coronary arteries sufficient height above annulus for deployment. Patient LIMA to LAD and patent SVG RCA, and patent radial graft to IM.   4.  Optimum angiographic angle for deployment LAO 3 Caudal 3 degrees   5. Severe MAC including both anterior, posterior annulus and intervalvular fibrosa   6.  Membranous septal length 5.4 mm   Maude Emmer   Electronically Signed: By: Maude Emmer M.D. On: 06/02/2024 12:41        Narrative & Impression  CLINICAL DATA:  Aortic valve replacement, preoperative evaluation   EXAM: CTA ABDOMEN AND PELVIS WITHOUT AND WITH CONTRAST   TECHNIQUE: Multidetector  CT imaging of the abdomen and pelvis was performed using the standard protocol during bolus administration of intravenous contrast. Multiplanar reconstructed images and MIPs were obtained and reviewed to evaluate the vascular anatomy.   RADIATION DOSE REDUCTION: This exam was performed according to the departmental dose-optimization program which includes automated exposure control,  adjustment of the mA and/or kV according to patient size and/or use of iterative reconstruction technique.   CONTRAST:  OMNIPAQUE  IOHEXOL  350 MG/ML SOLN   COMPARISON:  None Available.   FINDINGS: VASCULAR   Aorta: Mild atherosclerotic changes with minimal diameter estimated at 11 mm. No abdominal aortic aneurysm.   Celiac: Mild atherosclerotic changes in the proximal segment, patent.   SMA: Patent.   Renals: Mild atherosclerotic changes in the proximal segments bilaterally, patent.   IMA: Patent.   Inflow: The right common iliac artery is patent with minimal diameter estimated at 8 mm. The right internal iliac artery is patent. The right external iliac artery is patent with minimal diameter estimated at 7 mm.   The left common iliac artery is patent with minimal diameter estimated at 8 mm. The left internal iliac artery is patent. Left external iliac artery is patent with minimal diameter estimated at 8 mm.   Proximal Outflow: The right and left common femoral arteries are patent with minimal diameter estimated at 6 mm.   Veins: No obvious venous abnormality within the limitations of this arterial phase study.   Review of the MIP images confirms the above findings.   NON-VASCULAR   Lower chest: Reported separately.   Hepatobiliary: No gallstones, gallbladder wall thickening, or biliary dilatation.   Pancreas: Unremarkable. No pancreatic ductal dilatation or surrounding inflammatory changes.   Spleen: Normal in size without focal abnormality.   Adrenals/Urinary Tract: The adrenal glands are within normal limits. No hydronephrosis or significant nephrolithiasis. Urinary bladder is within normal limits.   Stomach/Bowel: No dilated loops of bowel are seen. Scattered colonic diverticulosis.   Lymphatic: No lymphadenopathy.   Reproductive: Prostate is unremarkable.   Other: Nothing significant.   Musculoskeletal: No acute or significant osseous  findings.   IMPRESSION: 1. Minimal access vessel diameters detailed above.     Electronically Signed   By: Maude Naegeli M.D.   On: 06/03/2024 07:30      Impression:   This 73 year old gentleman has stage D, severe, symptomatic, low-flow/low gradient aortic stenosis with NYHA class II symptoms of exertional fatigue and shortness of breath consistent with chronic diastolic congestive heart failure.  I have personally reviewed his 2D echocardiogram, cardiac catheterization, and CTA studies.  His echo shows a severely calcified and thickened aortic valve with restricted leaflet mobility.  The mean gradient is only 23 mmHg with a valve area is 1.09 cm with a low stroke-volume index of 33 and moderate to severe aortic insufficiency.  Left ventricular ejection fraction is 40 to 45%.  There is also severe mitral annular calcification with a mean gradient of 11.3 mmHg consistent with severe mitral valve stenosis without insufficiency.  Cardiac catheterization shows patent bypass grafts.  PA pressure  was 53/36 with a mean of 36 with a wedge pressure of 20 mmHg and V waves to 22 mmHg.  Cardiac index was 2.2.  Given his age, prior radiation therapy and coronary bypass graft surgery as well as other comorbidities I do not think he is a candidate for open surgical double valve replacement.  I think the best option for treating him would be transcatheter aortic valve replacement with the hope  that this will significantly improve his symptoms.  His mitral stenosis may require intervention at some point.  His gated cardiac CTA shows anatomy suitable for TAVR using a 29 mm SAPIEN 3 valve.  His abdominal and pelvic CTA shows adequate pelvic vascular anatomy to allow transfemoral insertion.  Electrocardiogram shows first-degree AV block with right bundle branch block and left anterior fascicular block which will increase the risk of complete heart block and we will plan to use right IJ pacing.   The patient and his  wife were counseled at length regarding treatment alternatives for management of severe symptomatic aortic stenosis. The risks and benefits of surgical intervention has been discussed in detail. Long-term prognosis with medical therapy was discussed. Alternative approaches such as conventional surgical aortic valve replacement, transcatheter aortic valve replacement, and palliative medical therapy were compared and contrasted at length. This discussion was placed in the context of the patient's own specific clinical presentation and past medical history. All of their questions have been addressed.    Following the decision to proceed with transcatheter aortic valve replacement, a discussion was held regarding what types of management strategies would be attempted intraoperatively in the event of life-threatening complications, including whether or not the patient would be considered a candidate for the use of cardiopulmonary bypass and/or conversion to open sternotomy for attempted surgical intervention.  Given his age, comorbidities, and prior coronary bypass surgery I do not think he is a candidate for emergent sternotomy to manage any intraoperative complications.  The patient has been advised of a variety of complications that might develop including but not limited to risks of death, stroke, paravalvular leak, aortic dissection or other major vascular complications, aortic annulus rupture, device embolization, cardiac rupture or perforation, mitral regurgitation, acute myocardial infarction, arrhythmia, heart block or bradycardia requiring permanent pacemaker placement, congestive heart failure, respiratory failure, renal failure, pneumonia, infection, other late complications related to structural valve deterioration or migration, or other complications that might ultimately cause a temporary or permanent loss of functional independence or other long term morbidity. The patient provides full informed consent  for the procedure as described and all questions were answered.       Plan:   Transfemoral TAVR using a SAPIEN 3 valve.       Dorise LOIS Fellers, MD

## 2024-07-08 ENCOUNTER — Inpatient Hospital Stay (HOSPITAL_COMMUNITY): Payer: Self-pay | Admitting: Physician Assistant

## 2024-07-08 ENCOUNTER — Inpatient Hospital Stay (HOSPITAL_COMMUNITY)
Admission: RE | Admit: 2024-07-08 | Discharge: 2024-07-15 | DRG: 266 | Disposition: A | Discharge status: Home / Self Care | Attending: Internal Medicine | Admitting: Internal Medicine

## 2024-07-08 ENCOUNTER — Inpatient Hospital Stay (HOSPITAL_COMMUNITY)

## 2024-07-08 ENCOUNTER — Encounter (HOSPITAL_COMMUNITY)
Admission: RE | Disposition: A | Discharge status: Home / Self Care | Payer: Self-pay | Source: Home / Self Care | Attending: Internal Medicine

## 2024-07-08 ENCOUNTER — Inpatient Hospital Stay (HOSPITAL_COMMUNITY): Payer: Self-pay

## 2024-07-08 ENCOUNTER — Encounter (HOSPITAL_COMMUNITY): Payer: Self-pay | Admitting: Internal Medicine

## 2024-07-08 DIAGNOSIS — E1151 Type 2 diabetes mellitus with diabetic peripheral angiopathy without gangrene: Secondary | ICD-10-CM | POA: Diagnosis present

## 2024-07-08 DIAGNOSIS — E1122 Type 2 diabetes mellitus with diabetic chronic kidney disease: Secondary | ICD-10-CM | POA: Diagnosis present

## 2024-07-08 DIAGNOSIS — I2510 Atherosclerotic heart disease of native coronary artery without angina pectoris: Secondary | ICD-10-CM | POA: Diagnosis present

## 2024-07-08 DIAGNOSIS — Z951 Presence of aortocoronary bypass graft: Secondary | ICD-10-CM | POA: Diagnosis not present

## 2024-07-08 DIAGNOSIS — I440 Atrioventricular block, first degree: Secondary | ICD-10-CM | POA: Diagnosis not present

## 2024-07-08 DIAGNOSIS — Z83438 Family history of other disorder of lipoprotein metabolism and other lipidemia: Secondary | ICD-10-CM

## 2024-07-08 DIAGNOSIS — N179 Acute kidney failure, unspecified: Secondary | ICD-10-CM | POA: Diagnosis present

## 2024-07-08 DIAGNOSIS — I130 Hypertensive heart and chronic kidney disease with heart failure and stage 1 through stage 4 chronic kidney disease, or unspecified chronic kidney disease: Secondary | ICD-10-CM | POA: Diagnosis present

## 2024-07-08 DIAGNOSIS — Z79890 Hormone replacement therapy (postmenopausal): Secondary | ICD-10-CM

## 2024-07-08 DIAGNOSIS — E860 Dehydration: Secondary | ICD-10-CM | POA: Diagnosis present

## 2024-07-08 DIAGNOSIS — G4730 Sleep apnea, unspecified: Secondary | ICD-10-CM | POA: Diagnosis present

## 2024-07-08 DIAGNOSIS — I350 Nonrheumatic aortic (valve) stenosis: Secondary | ICD-10-CM

## 2024-07-08 DIAGNOSIS — Z8249 Family history of ischemic heart disease and other diseases of the circulatory system: Secondary | ICD-10-CM

## 2024-07-08 DIAGNOSIS — M25561 Pain in right knee: Secondary | ICD-10-CM | POA: Diagnosis not present

## 2024-07-08 DIAGNOSIS — M25512 Pain in left shoulder: Secondary | ICD-10-CM | POA: Diagnosis not present

## 2024-07-08 DIAGNOSIS — R918 Other nonspecific abnormal finding of lung field: Secondary | ICD-10-CM | POA: Diagnosis not present

## 2024-07-08 DIAGNOSIS — Z820 Family history of epilepsy and other diseases of the nervous system: Secondary | ICD-10-CM

## 2024-07-08 DIAGNOSIS — I480 Paroxysmal atrial fibrillation: Secondary | ICD-10-CM | POA: Diagnosis not present

## 2024-07-08 DIAGNOSIS — K219 Gastro-esophageal reflux disease without esophagitis: Secondary | ICD-10-CM | POA: Diagnosis present

## 2024-07-08 DIAGNOSIS — N1832 Chronic kidney disease, stage 3b: Secondary | ICD-10-CM | POA: Diagnosis present

## 2024-07-08 DIAGNOSIS — N183 Chronic kidney disease, stage 3 unspecified: Secondary | ICD-10-CM | POA: Diagnosis not present

## 2024-07-08 DIAGNOSIS — E039 Hypothyroidism, unspecified: Secondary | ICD-10-CM | POA: Diagnosis present

## 2024-07-08 DIAGNOSIS — I442 Atrioventricular block, complete: Secondary | ICD-10-CM | POA: Diagnosis not present

## 2024-07-08 DIAGNOSIS — Z006 Encounter for examination for normal comparison and control in clinical research program: Secondary | ICD-10-CM

## 2024-07-08 DIAGNOSIS — E785 Hyperlipidemia, unspecified: Secondary | ICD-10-CM | POA: Diagnosis present

## 2024-07-08 DIAGNOSIS — I6521 Occlusion and stenosis of right carotid artery: Secondary | ICD-10-CM | POA: Diagnosis present

## 2024-07-08 DIAGNOSIS — I10 Essential (primary) hypertension: Secondary | ICD-10-CM | POA: Diagnosis present

## 2024-07-08 DIAGNOSIS — Z7951 Long term (current) use of inhaled steroids: Secondary | ICD-10-CM

## 2024-07-08 DIAGNOSIS — I700 Atherosclerosis of aorta: Secondary | ICD-10-CM | POA: Diagnosis not present

## 2024-07-08 DIAGNOSIS — Z803 Family history of malignant neoplasm of breast: Secondary | ICD-10-CM

## 2024-07-08 DIAGNOSIS — Z950 Presence of cardiac pacemaker: Secondary | ICD-10-CM | POA: Diagnosis not present

## 2024-07-08 DIAGNOSIS — Z7982 Long term (current) use of aspirin: Secondary | ICD-10-CM

## 2024-07-08 DIAGNOSIS — M1711 Unilateral primary osteoarthritis, right knee: Secondary | ICD-10-CM | POA: Diagnosis not present

## 2024-07-08 DIAGNOSIS — Z823 Family history of stroke: Secondary | ICD-10-CM

## 2024-07-08 DIAGNOSIS — G2581 Restless legs syndrome: Secondary | ICD-10-CM | POA: Diagnosis present

## 2024-07-08 DIAGNOSIS — Z923 Personal history of irradiation: Secondary | ICD-10-CM

## 2024-07-08 DIAGNOSIS — Z833 Family history of diabetes mellitus: Secondary | ICD-10-CM

## 2024-07-08 DIAGNOSIS — I453 Trifascicular block: Secondary | ICD-10-CM | POA: Diagnosis present

## 2024-07-08 DIAGNOSIS — I6529 Occlusion and stenosis of unspecified carotid artery: Secondary | ICD-10-CM | POA: Diagnosis present

## 2024-07-08 DIAGNOSIS — Z9221 Personal history of antineoplastic chemotherapy: Secondary | ICD-10-CM

## 2024-07-08 DIAGNOSIS — I77810 Thoracic aortic ectasia: Secondary | ICD-10-CM | POA: Diagnosis present

## 2024-07-08 DIAGNOSIS — Z9011 Acquired absence of right breast and nipple: Secondary | ICD-10-CM

## 2024-07-08 DIAGNOSIS — J45909 Unspecified asthma, uncomplicated: Secondary | ICD-10-CM | POA: Diagnosis present

## 2024-07-08 DIAGNOSIS — R0989 Other specified symptoms and signs involving the circulatory and respiratory systems: Secondary | ICD-10-CM | POA: Diagnosis not present

## 2024-07-08 DIAGNOSIS — R509 Fever, unspecified: Secondary | ICD-10-CM | POA: Diagnosis not present

## 2024-07-08 DIAGNOSIS — Z8673 Personal history of transient ischemic attack (TIA), and cerebral infarction without residual deficits: Secondary | ICD-10-CM

## 2024-07-08 DIAGNOSIS — I4891 Unspecified atrial fibrillation: Secondary | ICD-10-CM | POA: Diagnosis not present

## 2024-07-08 DIAGNOSIS — Z87891 Personal history of nicotine dependence: Secondary | ICD-10-CM

## 2024-07-08 DIAGNOSIS — I951 Orthostatic hypotension: Secondary | ICD-10-CM | POA: Diagnosis present

## 2024-07-08 DIAGNOSIS — Z79899 Other long term (current) drug therapy: Secondary | ICD-10-CM

## 2024-07-08 DIAGNOSIS — I5043 Acute on chronic combined systolic (congestive) and diastolic (congestive) heart failure: Secondary | ICD-10-CM | POA: Diagnosis not present

## 2024-07-08 DIAGNOSIS — C8190 Hodgkin lymphoma, unspecified, unspecified site: Secondary | ICD-10-CM | POA: Diagnosis present

## 2024-07-08 DIAGNOSIS — C819A Hodgkin lymphoma, unspecified, in remission: Secondary | ICD-10-CM | POA: Diagnosis present

## 2024-07-08 DIAGNOSIS — E7800 Pure hypercholesterolemia, unspecified: Secondary | ICD-10-CM | POA: Diagnosis present

## 2024-07-08 DIAGNOSIS — Z952 Presence of prosthetic heart valve: Secondary | ICD-10-CM | POA: Diagnosis not present

## 2024-07-08 DIAGNOSIS — M19012 Primary osteoarthritis, left shoulder: Secondary | ICD-10-CM | POA: Diagnosis not present

## 2024-07-08 DIAGNOSIS — J9811 Atelectasis: Secondary | ICD-10-CM | POA: Diagnosis not present

## 2024-07-08 DIAGNOSIS — Z853 Personal history of malignant neoplasm of breast: Secondary | ICD-10-CM

## 2024-07-08 DIAGNOSIS — Z7901 Long term (current) use of anticoagulants: Secondary | ICD-10-CM

## 2024-07-08 DIAGNOSIS — I4510 Unspecified right bundle-branch block: Secondary | ICD-10-CM | POA: Diagnosis not present

## 2024-07-08 DIAGNOSIS — C50919 Malignant neoplasm of unspecified site of unspecified female breast: Secondary | ICD-10-CM | POA: Diagnosis present

## 2024-07-08 DIAGNOSIS — R4182 Altered mental status, unspecified: Secondary | ICD-10-CM | POA: Diagnosis not present

## 2024-07-08 HISTORY — PX: INTRAOPERATIVE TRANSTHORACIC ECHOCARDIOGRAM: SHX6523

## 2024-07-08 LAB — ECHOCARDIOGRAM LIMITED
AR max vel: 4.02 cm2
AV Area VTI: 3.77 cm2
AV Area mean vel: 3.84 cm2
AV Mean grad: 2 mmHg
AV Peak grad: 4.2 mmHg
Ao pk vel: 1.02 m/s
MV VTI: 0.85 cm2
S' Lateral: 3.9 cm

## 2024-07-08 LAB — BASIC METABOLIC PANEL WITH GFR
Anion gap: 14 (ref 5–15)
BUN: 14 mg/dL (ref 8–23)
CO2: 25 mmol/L (ref 22–32)
Calcium: 8.7 mg/dL — ABNORMAL LOW (ref 8.9–10.3)
Chloride: 103 mmol/L (ref 98–111)
Creatinine, Ser: 1.46 mg/dL — ABNORMAL HIGH (ref 0.61–1.24)
GFR, Estimated: 50 mL/min — ABNORMAL LOW (ref >60)
Glucose, Bld: 108 mg/dL — ABNORMAL HIGH (ref 70–99)
Potassium: 3.9 mmol/L (ref 3.5–5.1)
Sodium: 142 mmol/L (ref 135–145)

## 2024-07-08 LAB — POCT ACTIVATED CLOTTING TIME: Activated Clotting Time: 256 s

## 2024-07-08 SURGERY — TRANSCATHETER AORTIC VALVE REPLACEMENT, TRANSFEMORAL (CATHLAB)
Anesthesia: Monitor Anesthesia Care | Laterality: Right

## 2024-07-08 MED ORDER — PROTAMINE SULFATE 10 MG/ML IV SOLN
INTRAVENOUS | Status: DC | PRN
Start: 2024-07-08 — End: 2024-07-08
  Administered 2024-07-08: 140 mg via INTRAVENOUS

## 2024-07-08 MED ORDER — ACETAMINOPHEN 650 MG RE SUPP: 650.0000 mg | Freq: Four times a day (QID) | RECTAL | Status: DC | PRN

## 2024-07-08 MED ORDER — NOREPINEPHRINE 4 MG/250ML-% IV SOLN: 0.0000 ug/min | INTRAVENOUS | Status: DC

## 2024-07-08 MED ORDER — MAGNESIUM 500 MG PO TABS: 500.0000 mg | ORAL_TABLET | ORAL | Status: DC

## 2024-07-08 MED ORDER — POTASSIUM CHLORIDE CRYS ER 20 MEQ PO TBCR
20.0000 meq | EXTENDED_RELEASE_TABLET | Freq: Once | ORAL | Status: AC
  Administered 2024-07-08: 20 meq via ORAL
  Filled 2024-07-08: qty 1

## 2024-07-08 MED ORDER — SODIUM CHLORIDE 0.9 % IV SOLN: 250.0000 mL | INTRAVENOUS | Status: DC | PRN

## 2024-07-08 MED ORDER — ONDANSETRON HCL 4 MG/2ML IJ SOLN: 4.0000 mg | Freq: Four times a day (QID) | INTRAMUSCULAR | Status: DC | PRN

## 2024-07-08 MED ORDER — FAMOTIDINE 20 MG PO TABS
20.0000 mg | ORAL_TABLET | Freq: Every day | ORAL | Status: AC
Start: 2024-07-08 — End: ?
  Administered 2024-07-08 – 2024-07-14 (×7): 20 mg via ORAL
  Filled 2024-07-08 (×7): qty 1

## 2024-07-08 MED ORDER — CHLORHEXIDINE GLUCONATE 4 % EX SOLN: 60.0000 mL | Freq: Once | CUTANEOUS | Status: DC

## 2024-07-08 MED ORDER — LACTATED RINGERS IV SOLN: INTRAVENOUS | Status: DC | PRN

## 2024-07-08 MED ORDER — NITROGLYCERIN IN D5W 200-5 MCG/ML-% IV SOLN
0.0000 ug/min | INTRAVENOUS | Status: DC
  Administered 2024-07-08: 5 ug/min via INTRAVENOUS
  Filled 2024-07-08: qty 250

## 2024-07-08 MED ORDER — ASPIRIN 81 MG PO TBEC
81.0000 mg | DELAYED_RELEASE_TABLET | Freq: Every evening | ORAL | Status: DC
Start: 2024-07-08 — End: 2024-07-15
  Administered 2024-07-08 – 2024-07-14 (×7): 81 mg via ORAL
  Filled 2024-07-08 (×7): qty 1

## 2024-07-08 MED ORDER — SODIUM CHLORIDE 0.9% FLUSH: 3.0000 mL | INTRAVENOUS | Status: DC | PRN

## 2024-07-08 MED ORDER — CEFAZOLIN SODIUM-DEXTROSE 2-4 GM/100ML-% IV SOLN
2.0000 g | Freq: Three times a day (TID) | INTRAVENOUS | Status: AC
  Administered 2024-07-08 (×2): 2 g via INTRAVENOUS
  Filled 2024-07-08 (×2): qty 100

## 2024-07-08 MED ORDER — CHLORHEXIDINE GLUCONATE 0.12 % MT SOLN
15.0000 mL | Freq: Once | OROMUCOSAL | Status: AC
  Administered 2024-07-08: 15 mL via OROMUCOSAL
  Filled 2024-07-08: qty 15

## 2024-07-08 MED ORDER — PROPOFOL 500 MG/50ML IV EMUL
INTRAVENOUS | Status: DC | PRN
  Administered 2024-07-08: 30 ug/kg/min via INTRAVENOUS
  Administered 2024-07-08: 20 mg via INTRAVENOUS

## 2024-07-08 MED ORDER — CHLORHEXIDINE GLUCONATE 4 % EX SOLN: 30.0000 mL | CUTANEOUS | Status: DC

## 2024-07-08 MED ORDER — OXYCODONE HCL 5 MG PO TABS
5.0000 mg | ORAL_TABLET | ORAL | Status: DC | PRN
  Administered 2024-07-13 (×2): 10 mg via ORAL
  Filled 2024-07-08 (×2): qty 2

## 2024-07-08 MED ORDER — LIDOCAINE HCL (PF) 1 % IJ SOLN
INTRAMUSCULAR | Status: AC
  Filled 2024-07-08: qty 30

## 2024-07-08 MED ORDER — IODIXANOL 320 MG/ML IV SOLN
INTRAVENOUS | Status: DC | PRN
  Administered 2024-07-08: 65 mL via INTRA_ARTERIAL

## 2024-07-08 MED ORDER — LIDOCAINE HCL (PF) 1 % IJ SOLN
INTRAMUSCULAR | Status: DC | PRN
  Administered 2024-07-08: 5 mL
  Administered 2024-07-08: 20 mL

## 2024-07-08 MED ORDER — ALPRAZOLAM 0.25 MG PO TABS
1.0000 mg | ORAL_TABLET | Freq: Every day | ORAL | Status: DC
Start: 2024-07-08 — End: 2024-07-10
  Administered 2024-07-08 – 2024-07-09 (×2): 1 mg via ORAL
  Filled 2024-07-08 (×2): qty 4

## 2024-07-08 MED ORDER — ATORVASTATIN CALCIUM 40 MG PO TABS
40.0000 mg | ORAL_TABLET | Freq: Every day | ORAL | Status: DC
  Administered 2024-07-08 – 2024-07-15 (×8): 40 mg via ORAL
  Filled 2024-07-08 (×8): qty 1

## 2024-07-08 MED ORDER — AMISULPRIDE (ANTIEMETIC) 5 MG/2ML IV SOLN: 10.0000 mg | Freq: Once | INTRAVENOUS | Status: DC | PRN

## 2024-07-08 MED ORDER — ACETAMINOPHEN 325 MG PO TABS: 650.0000 mg | ORAL_TABLET | Freq: Four times a day (QID) | ORAL | Status: DC | PRN

## 2024-07-08 MED ORDER — FENTANYL CITRATE (PF) 100 MCG/2ML IJ SOLN: 25.0000 ug | INTRAMUSCULAR | Status: DC | PRN

## 2024-07-08 MED ORDER — TRAMADOL HCL 50 MG PO TABS
50.0000 mg | ORAL_TABLET | ORAL | Status: DC | PRN
  Administered 2024-07-11 – 2024-07-13 (×2): 100 mg via ORAL
  Filled 2024-07-08 (×2): qty 2

## 2024-07-08 MED ORDER — SODIUM CHLORIDE 0.9% FLUSH
3.0000 mL | Freq: Two times a day (BID) | INTRAVENOUS | Status: DC
  Administered 2024-07-09 – 2024-07-13 (×10): 3 mL via INTRAVENOUS

## 2024-07-08 MED ORDER — PRAMIPEXOLE DIHYDROCHLORIDE 0.25 MG PO TABS
0.5000 mg | ORAL_TABLET | Freq: Every day | ORAL | Status: DC
  Administered 2024-07-08 – 2024-07-15 (×8): 0.5 mg via ORAL
  Filled 2024-07-08 (×9): qty 2

## 2024-07-08 MED ORDER — FUROSEMIDE 10 MG/ML IJ SOLN
20.0000 mg | Freq: Once | INTRAMUSCULAR | Status: AC
  Administered 2024-07-08: 20 mg via INTRAVENOUS
  Filled 2024-07-08: qty 2

## 2024-07-08 MED ORDER — MORPHINE SULFATE (PF) 2 MG/ML IV SOLN
1.0000 mg | INTRAVENOUS | Status: DC | PRN
  Administered 2024-07-11 – 2024-07-12 (×2): 2 mg via INTRAVENOUS
  Filled 2024-07-08 (×2): qty 1

## 2024-07-08 MED ORDER — HEPARIN SODIUM (PORCINE) 1000 UNIT/ML IJ SOLN
INTRAMUSCULAR | Status: DC | PRN
  Administered 2024-07-08: 14000 [IU] via INTRAVENOUS

## 2024-07-08 MED ORDER — PANTOPRAZOLE SODIUM 40 MG PO TBEC
40.0000 mg | DELAYED_RELEASE_TABLET | Freq: Every day | ORAL | Status: DC
  Administered 2024-07-08 – 2024-07-15 (×8): 40 mg via ORAL
  Filled 2024-07-08 (×8): qty 1

## 2024-07-08 MED ORDER — FLUTICASONE FUROATE-VILANTEROL 100-25 MCG/ACT IN AEPB
1.0000 | INHALATION_SPRAY | Freq: Every day | RESPIRATORY_TRACT | Status: DC
  Administered 2024-07-09 – 2024-07-15 (×7): 1 via RESPIRATORY_TRACT
  Filled 2024-07-08: qty 28

## 2024-07-08 MED ORDER — ONDANSETRON HCL 4 MG/2ML IJ SOLN: 4.0000 mg | Freq: Once | INTRAMUSCULAR | Status: DC | PRN

## 2024-07-08 MED ORDER — SODIUM CHLORIDE 0.9 % IV SOLN: INTRAVENOUS | Status: AC

## 2024-07-08 MED ORDER — BUSPIRONE HCL 5 MG PO TABS
5.0000 mg | ORAL_TABLET | Freq: Every day | ORAL | Status: DC
Start: 2024-07-08 — End: 2024-07-15
  Administered 2024-07-08 – 2024-07-15 (×8): 5 mg via ORAL
  Filled 2024-07-08 (×8): qty 1

## 2024-07-08 MED ORDER — SODIUM CHLORIDE 0.9 % IV SOLN: INTRAVENOUS | Status: DC

## 2024-07-08 MED ORDER — HEPARIN (PORCINE) IN NACL 1000-0.9 UT/500ML-% IV SOLN
INTRAVENOUS | Status: DC | PRN
  Administered 2024-07-08: 1500 mL via SURGICAL_CAVITY

## 2024-07-08 MED ORDER — LEVOTHYROXINE SODIUM 75 MCG PO TABS
75.0000 ug | ORAL_TABLET | Freq: Every day | ORAL | Status: DC
Start: 2024-07-09 — End: 2024-07-15
  Administered 2024-07-09 – 2024-07-15 (×7): 75 ug via ORAL
  Filled 2024-07-08 (×7): qty 1

## 2024-07-08 MED ORDER — CITALOPRAM HYDROBROMIDE 20 MG PO TABS
40.0000 mg | ORAL_TABLET | Freq: Every evening | ORAL | Status: DC
Start: 2024-07-08 — End: 2024-07-15
  Administered 2024-07-08 – 2024-07-14 (×7): 40 mg via ORAL
  Filled 2024-07-08 (×7): qty 2

## 2024-07-08 SURGICAL SUPPLY — 30 items
BAG SNAP BAND KOVER 36X36 (MISCELLANEOUS) ×4 IMPLANT
CABLE ADAPT PACING TEMP 12FT (ADAPTER) IMPLANT
CATH COMMANDER DELIVERY SYS 29 (CATHETERS) IMPLANT
CATH DIAG 6FR PIGTAIL ANGLED (CATHETERS) IMPLANT
CATH INFINITI 5FR ANG PIGTAIL (CATHETERS) IMPLANT
CATH INFINITI 6F AL1 (CATHETERS) IMPLANT
CLOSURE MYNX CONTROL 6F/7F (Vascular Products) IMPLANT
CLOSURE PERCLOSE PROSTYLE (Vascular Products) IMPLANT
CRIMPER (MISCELLANEOUS) IMPLANT
DEVICE INFLATION ATRION QL38 (MISCELLANEOUS) IMPLANT
KIT SAPIAN 3 ULTRA RESILIA 29 (Valve) IMPLANT
PACK CARDIAC CATHETERIZATION (CUSTOM PROCEDURE TRAY) ×2 IMPLANT
PAD SORBX EP SHIELD 16.5X12 (MISCELLANEOUS) IMPLANT
SET ATX-X65L (MISCELLANEOUS) IMPLANT
SHEATH INTRODUCER SET 29 (SHEATH) IMPLANT
SHEATH PINNACLE 6F 10CM (SHEATH) IMPLANT
SHEATH PINNACLE 8F 10CM (SHEATH) IMPLANT
SHEATH PROBE COVER 6X72 (BAG) IMPLANT
SHIELD CATH-GARD CONTAMINATION (MISCELLANEOUS) IMPLANT
STOPCOCK MORSE 400PSI 3WAY (MISCELLANEOUS) ×4 IMPLANT
TRANSDUCER W/STOPCOCK (MISCELLANEOUS) IMPLANT
TUBING ART PRESS 72 MALE/FEM (TUBING) IMPLANT
TUBING CIL FLEX 10 FLL-RA (TUBING) IMPLANT
WIRE AMPLATZ SS-J .035X180CM (WIRE) IMPLANT
WIRE EMERALD 3MM-J .035X150CM (WIRE) IMPLANT
WIRE EMERALD 3MM-J .035X260CM (WIRE) IMPLANT
WIRE EMERALD ST .035X260CM (WIRE) IMPLANT
WIRE MICRO SET SILHO 5FR 7 (SHEATH) IMPLANT
WIRE PACING TEMP ST TIP 5 (CATHETERS) IMPLANT
WIRE SAFARI SM CURVE 275 (WIRE) IMPLANT

## 2024-07-08 NOTE — Progress Notes (Signed)
  [facility]   MULTIDISCIPLINARY HEART VALVE TEAM  Patient doing well s/p TAVR. He is hemodynamically stable. Groin sites stable. Tele with paced beats with pacer set to 50. I turned down to 30 and pt's HR went into high 30s. Keep internal jugular pacer in and will alert EP team. LVEDP 20 at the time of TAVR. Will treat with one dose of IV lsaix 20mg .    Ryan Hummer PA-C  [facility]  Pager [phone]

## 2024-07-08 NOTE — Discharge Summary (Cosign Needed)
 HEART AND VASCULAR CENTER   MULTIDISCIPLINARY HEART VALVE TEAM  Discharge Summary    Patient ID: Ryan [name] MRN: [id number]; DOB: [birth year]  Admit date: 07/08/2024 Discharge date: 07/15/2024  PCP:  Shona Norleen PEDLAR, MD  [facility] Cardiologist:  [name], MD  [facility] Structural heart: [name], MD [facility] Electrophysiologist:  [name], MD   Discharge Diagnoses    Principal Problem:   S/P TAVR (transcatheter aortic valve replacement) Active Problems:   Invasive ductal carcinoma of right breast, stage 2- 2012 s/p chemo and mastectomy   Hodgkin's disease-1983   Hyperlipidemia with target LDL less than 70   Orthostatic hypotension   Coronary atherosclerosis of native coronary artery - multivessel CAD, status post CABG x3 (LIMA-LAD, Left Radial-RI, SVG-RCA)   Carotid artery stenosis   Essential hypertension   Severe aortic stenosis   Trifascicular block   Allergies No Known Allergies  Diagnostic Studies/Procedures    [facility] VASCULAR CENTER  TAVR OPERATIVE NOTE     Date of Procedure:                07/08/2024   Preoperative Diagnosis:      Severe Aortic Stenosis    Postoperative Diagnosis:    Same    Procedure:        Transcatheter Aortic Valve Replacement - Transfemoral Approach             Edwards Sapien 3 Resilia THV (size 29 mm, model # [id number])              Co-Surgeons:                         Dorise LOIS Fellers, MD, [name], MD and Lurena Red, MD Anesthesiologist:                  [place]   Echocardiographer:              [place]   Pre-operative Echo Findings: Severe aortic stenosis Normal left ventricular systolic function   Post-operative Echo Findings: No paravalvular leak Normal left ventricular systolic function   Left Heart Catheterization Findings: Left ventricular end-diastolic pressure of  _____________   Echo 07/09/24: IMPRESSIONS   1. Left ventricular ejection fraction, by estimation,  is 60 to 65%. The  left ventricle has normal function. The left ventricle has no regional  wall motion abnormalities. There is mild left ventricular hypertrophy.  Left ventricular diastolic parameters  are indeterminate.   2. Right ventricular systolic function is mildly reduced. The right  ventricular size is moderately enlarged.   3. The mitral valve is abnormal. Trivial mitral valve regurgitation.  Moderate mitral stenosis. The mean mitral valve gradient is 8.0 mmHg with  average heart rate of 68 bpm. Moderate to severe mitral annular  calcification.   4. Trivial posterior perivalvular leak. The aortic valve has been  repaired/replaced. Aortic valve regurgitation is trivial. No aortic  stenosis is present. There is a 29 mm Edwards Sapien prosthetic (TAVR)  valve present in the aortic position. Procedure  Date: 07/09/24. Aortic valve mean gradient measures 4.0 mmHg. Aortic valve  Vmax measures 1.37 m/s. Aortic valve acceleration time measures 99 msec.   ____________________  PPM implant 07/11/24  CONCLUSIONS:   1. Successful dual chamber pacemaker with LBBAP lead implant.  2.  No early apparent complications.    History of Present Illness     Ryan [name] is a 73 y.o.  male with a history of CAD s/p CABG x 3 LIMA-LAD, free LRA to RI, SVG-RCA (2014), DMT2 without insulin , stage 2 infiltrating ductal carcinoma of breast s/p Tamoxifen  and R mastectomy 2012, Hodgkin lymphoma s/p mantle XRT 1983, CKD stage IIIb, HFrEF, HLD, asthma, bilateral carotid artery stenosis, RBBB with LAFB, asthma, phrenic nerve palsy, severe MAC with mitral valve stenosis, and severe AS with AI who presented to [facility] on 07/08/24 for planned TAVR.   He had previously developed worsening exertional shortness of breath and fatigue as well as some episodes of dizziness. Echo on 04/03/24 showed EF 40%, mod RV dilation, mild dysfunction, LFLG AS with mean grad 23 mmHg, AVA 1.09 cm2, SVI 33, mod-severe eccentric AI, severe  MAC with severe mitral stenosis and dilated aortic root 44mm. [facility] 05/20/24 showed severe native CAD with patent bypass grafts. PA pressure was 53/36 with a mean of 36 with a wedge pressure of 20 mmHg and V waves to 22 mmHg. Cardiac index was 2.2. RAP 11, PAPI 1.5. Given elevated RA pressure and decreased PA pulsatility index, his lasix  was increased to 20mg  BID.   The patient was evaluated by the multidisciplinary valve team and felt to have severe, symptomatic aortic stenosis and to be a suitable candidate for TAVR, which was set up for 07/08/24. PAT labs showed creat 1.6-->2.21 and Lasix  was held.   Hospital Course     Consultants: none   Severe AS:  -- S/p successful TAVR with a 29 mm Edwards Sapien 3 Ultra Resilia THV via the TF approach on 07/08/24.  -- Post operative echo showed EF 60%, mild LVH, mod RVE, mod-severe MAC with mod MS and normally functioning TAVR with a mean gradient of 4 mmHg and trivial PVL. -- Groin sites are stable. -- Start Eliquis 5mg  BID on 07/17/2024 and stop aspirin  81mg  daily.  -- Met with cardiac rehab to discuss CRP phase II.  -- Plan for discharge home today with follow up in [facility] on 07/25/24. -- I will see him back for 1 month follow up and OV in Novemeber.   RBBB with, 1st deg AV block and LAFB: -- Transient advanced heart block with TAVR requiring continued pacer support.  -- S/p Abbott dual-chamber PPM on 07/11/2024 by Dr. Kennyth.   Acute on chronic HFimpEF: -- EF 40%--> 60-65% on POD 1 echo. -- LVEDP 20 mm hg at the time of TAVR.  -- Treated with one dose of IV lasix  20mg  x 1. -- He was initially resumed on Lasix  20mg  daily but this has been discontinued due to worsening renal function and dehydration.  -- He appears euvolemic.  -- Will discharge him on Lasix  20mg  PRN.  -- BMET at follow up.  AKI in the setting of CKD stage IIIb:  -- Creat 2.21 on PAT labs and lasix  held.  -- Creatinine at 1.69 at the time of hospital discharge and will  need a BMET at outpatient appt.  -- Discharge weight at 202 lbs.  -- BMET at follow up.   PAF: -- New onset afib noted during admission.  -- CHADSVASC of at least 5. -- Start Eliquis on 07/17/2024. -- Start Toprol  XL 12.5mg  BID.    HTN:  -- BP well controlled.  -- Started on Losartan 25mg  for renal protection with CKD and Toprol -XL 12.5mg  daily. -- Restarted Lasix  at 20mg  daily.    CAD s/p CABG: -- [facility] 05/20/24 showed severe native CAD with patent bypass grafts.  -- Continue medical therapy.  -- Continue  Simvastatin  80mg  daily.    Mitral valve disease: -- Severe MAC with mitral valve stenosis.  Fever with altered mentation:  -- On 07/13/2024, noted to be febrile to 102. Discharge cancelled.  -- Confusion felt to be related oxycodone /morphine  which were discontinued.  -- UA and CXR obtained and unremarkable. CXR with atelectasis treated with incentive spiro. -- Lactic acid normal.  -- Blood cultures NGTD.  -- Started on vancomycin  and Rocephin  given recent TAVR/PPM placement. -- Ultimately, work up negative for active infection and no abx recommended at discharge.   Debilitation: -- Per pt and family report, he was so weak he could not stand.  -- Seen by PT on 10/4 and again today. He was able to ambulate 500 feet and did quite well.  -- He has no home health needs.   _____________  Discharge Vitals Blood pressure 130/89, pulse 85, temperature 98 F (36.7 C), temperature source Oral, resp. rate 14, height 6' 3 (1.905 m), weight 92 kg, SpO2 97%.  Filed Weights   07/13/24 0500 07/14/24 0600 07/15/24 0316  Weight: 85 kg 90.5 kg 92 kg    Disposition   Pt is being discharged home today in good condition.  Follow-up Plans & Appointments     Follow-up Information     [name], [place]. Go on 07/25/2024.   Specialties: Cardiology, Cardiology Why: @ 2:30pm, please arrive at least 20 minutes early. Contact information: [address] [place]  [address] [phone]                Discharge Instructions     Amb Referral to Cardiac Rehabilitation   Complete by: As directed    Diagnosis: Valve Replacement   Valve: Aortic   After initial evaluation and assessments completed: Virtual Based Care may be provided alone or in conjunction with Phase 2 Cardiac Rehab based on patient barriers.: Yes   Intensive Cardiac Rehabilitation (ICR) [facility] location only OR Traditional Cardiac Rehabilitation (TCR) *If criteria for ICR are not met will enroll in TCR [facility] only): Yes   Diet - low sodium heart healthy   Complete by: As directed    Discharge wound care:   Complete by: As directed    See AVS       Discharge Medications   Allergies as of 07/15/2024   No Known Allergies      Medication List     STOP taking these medications    aspirin  EC 81 MG tablet   TURMERIC PO       TAKE these medications    acetaminophen  500 MG tablet Commonly known as: TYLENOL  Take 500 mg by mouth every 6 (six) hours as needed for mild pain.   ALPRAZolam  1 MG tablet Commonly known as: XANAX  Take 1 mg by mouth at bedtime.   apixaban 5 MG Tabs tablet Commonly known as: ELIQUIS Take 1 tablet (5 mg total) by mouth 2 (two) times daily. Start taking on: July 17, 2024 Notes to patient: DO NOT start until Thursday (07/17/2024)!   busPIRone 5 MG tablet Commonly known as: BUSPAR Take 5 mg by mouth daily.   citalopram  20 MG tablet Commonly known as: CELEXA  Take 40 mg by mouth every evening.   docusate sodium  100 MG capsule Commonly known as: COLACE Take 100 mg by mouth 2 (two) times a week.   famotidine  20 MG tablet Commonly known as: Pepcid  One after supper   folic acid  400 MCG tablet Commonly known as: FOLVITE  Take 400 mcg by mouth  daily.   furosemide  20 MG tablet Commonly known as: Lasix  Take 1 tablet (20 mg total) by mouth as needed for edema or fluid. What changed:  when to take this reasons to take this   levothyroxine   75 MCG tablet Commonly known as: SYNTHROID  Take 75 mcg by mouth at bedtime.   Magnesium  500 MG Tabs Take 500 mg by mouth 3 (three) times a week.   metoprolol  succinate 25 MG 24 hr tablet Commonly known as: TOPROL -XL Take 0.5 tablets (12.5 mg total) by mouth 2 (two) times daily.   nitroGLYCERIN  0.4 MG SL tablet Commonly known as: NITROSTAT  Place 1 tablet (0.4 mg total) under the tongue every 5 (five) minutes as needed for chest pain.   OVER THE COUNTER MEDICATION Take 2 tablets by mouth daily. Ultra kidney complex   OVER THE COUNTER MEDICATION Take 1 tablet by mouth 3 (three) times a week. Herbal laxative otc supplement   pantoprazole  40 MG tablet Commonly known as: Protonix  Take 1 tablet (40 mg total) by mouth daily. Take 30-60 min before first meal of the day   pramipexole 0.5 MG tablet Commonly known as: MIRAPEX Take 0.5 mg by mouth daily.   RESTFUL LEGS SL Place 1 tablet under the tongue at bedtime.   simvastatin  80 MG tablet Commonly known as: ZOCOR  Take 80 mg by mouth every evening.   SOOTHE XP OP Place 1 drop into both eyes daily as needed (dry eyes).   Symbicort  80-4.5 MCG/ACT inhaler Generic drug: budesonide -formoterol  Inhale 2 puffs into the lungs 2 (two) times daily.   Ventolin  HFA 108 (90 Base) MCG/ACT inhaler Generic drug: albuterol  Inhale 2 puffs into the lungs as needed for wheezing or shortness of breath.   vitamin B-12 500 MCG tablet Commonly known as: CYANOCOBALAMIN  Take 500 mcg by mouth daily.   Vitamin D3 50 MCG (2000 UT) capsule Take 2,000 Units by mouth daily.               Discharge Care Instructions  (From admission, onward)           Start     Ordered   07/13/24 0000  Discharge wound care:       Comments: See AVS   07/13/24 1019                Outstanding Labs/Studies   BMET at Follow-up.   Duration of Discharge Encounter: APP Time: 45 minutes    Signed, Lamarr Hummer, PA-C 07/15/2024, 10:39  AM [phone]

## 2024-07-08 NOTE — Progress Notes (Signed)
  Echocardiogram 2D Echocardiogram has been performed.  Ryan [name] 07/08/2024, 1:14 PM

## 2024-07-08 NOTE — Anesthesia Preprocedure Evaluation (Signed)
 Anesthesia Evaluation  Patient identified by MRN, date of birth, ID band Patient awake    Reviewed: Allergy & Precautions, NPO status , Patient's Chart, lab work & pertinent test results  Airway Mallampati: I  TM Distance: >3 FB Neck ROM: Full    Dental  (+) Edentulous Upper, Edentulous Lower   Pulmonary sleep apnea , former smoker    + decreased breath sounds      Cardiovascular hypertension, + CAD  + Valvular Problems/Murmurs AS  Rhythm:Regular Rate:Normal + Systolic murmurs Echo:  1. Left ventricular ejection fraction, by estimation, is 40 to 45%. Left  ventricular ejection fraction by 3D volume is 46 %. The left ventricle has  mildly decreased function. The left ventricle has no regional wall motion  abnormalities. Left ventricular   diastolic parameters are indeterminate.   2. Right ventricular systolic function is mildly reduced. The right  ventricular size is moderately enlarged.   3. Left atrial size was mildly dilated.   4. The mitral valve is abnormal. No evidence of mitral valve  regurgitation. Severe mitral stenosis. The mean mitral valve gradient is  11.3 mmHg. Severe mitral annular calcification.   5. Aortic dilatation noted. There is moderate dilatation of the aortic  root, measuring 44 mm.   6. The aortic valve has an indeterminant number of cusps. There is  moderate calcification of the aortic valve. There is moderate thickening  of the aortic valve. There is severe restriction in the opening of aortic  valve leaflets. Aortic valve  regurgitation is moderate to severe, eccentric jet. Severity of aortic  regurgitation is likely underestimated due to the eccentric nature of the  jet. There is low-flow, low-gradient severe aortic valve stenosis. Aortic  regurgitation PHT measures 285  msec. Aortic valve area, by VTI measures 1.09 cm. Aortic valve mean  gradient measures 23.0 mmHg. Aortic valve Vmax measures  3.21 m/s.     Neuro/Psych  PSYCHIATRIC DISORDERS  Depression     Neuromuscular disease CVA    GI/Hepatic Neg liver ROS,GERD  Medicated,,  Endo/Other  Hypothyroidism    Renal/GU negative Renal ROS     Musculoskeletal negative musculoskeletal ROS (+)    Abdominal   Peds  Hematology  (+) Blood dyscrasia, anemia   Anesthesia Other Findings   Reproductive/Obstetrics                              Anesthesia Physical Anesthesia Plan  ASA: 4  Anesthesia Plan: MAC   Post-op Pain Management: Minimal or no pain anticipated   Induction: Intravenous  PONV Risk Score and Plan: 0 and Propofol  infusion  Airway Management Planned: Nasal Cannula and Natural Airway  Additional Equipment: None  Intra-op Plan:   Post-operative Plan:   Informed Consent: I have reviewed the patients History and Physical, chart, labs and discussed the procedure including the risks, benefits and alternatives for the proposed anesthesia with the patient or authorized representative who has indicated his/her understanding and acceptance.       Plan Discussed with: CRNA  Anesthesia Plan Comments:         Anesthesia Quick Evaluation

## 2024-07-08 NOTE — Discharge Summary (Incomplete Revision)
 HEART AND VASCULAR CENTER   MULTIDISCIPLINARY HEART VALVE TEAM  Discharge Summary    Patient ID: Ryan [name] MRN: [id number]; DOB: 1950-12-10  Admit date: 07/08/2024 Discharge date: 07/13/2024  PCP:  Shona Norleen PEDLAR, MD  [facility] Cardiologist:  [name], MD  [facility] Structural heart: [name], MD [facility] Electrophysiologist:  [name], MD   Discharge Diagnoses    Principal Problem:   S/P TAVR (transcatheter aortic valve replacement) Active Problems:   Invasive ductal carcinoma of right breast, stage 2- 2012 s/p chemo and mastectomy   Hodgkin's disease-1983   Hyperlipidemia with target LDL less than 70   Orthostatic hypotension   Coronary atherosclerosis of native coronary artery - multivessel CAD, status post CABG x3 (LIMA-LAD, Left Radial-RI, SVG-RCA)   Carotid artery stenosis   Essential hypertension   Severe aortic stenosis   Trifascicular block   Allergies No Known Allergies  Diagnostic Studies/Procedures    HEART AND VASCULAR CENTER  TAVR OPERATIVE NOTE     Date of Procedure:                07/08/2024   Preoperative Diagnosis:      Severe Aortic Stenosis    Postoperative Diagnosis:    Same    Procedure:        Transcatheter Aortic Valve Replacement - Transfemoral Approach             Edwards Sapien 3 Resilia THV (size 29 mm, model # [id number])              Co-Surgeons:                         Dorise LOIS Fellers, MD, [name], MD and Lurena Red, MD Anesthesiologist:                  [place]   Echocardiographer:              [place]   Pre-operative Echo Findings: Severe aortic stenosis Normal left ventricular systolic function   Post-operative Echo Findings: No paravalvular leak Normal left ventricular systolic function   Left Heart Catheterization Findings: Left ventricular end-diastolic pressure of  _____________   Echo 07/09/24: IMPRESSIONS   1. Left ventricular ejection fraction, by estimation,  is 60 to 65%. The  left ventricle has normal function. The left ventricle has no regional  wall motion abnormalities. There is mild left ventricular hypertrophy.  Left ventricular diastolic parameters  are indeterminate.   2. Right ventricular systolic function is mildly reduced. The right  ventricular size is moderately enlarged.   3. The mitral valve is abnormal. Trivial mitral valve regurgitation.  Moderate mitral stenosis. The mean mitral valve gradient is 8.0 mmHg with  average heart rate of 68 bpm. Moderate to severe mitral annular  calcification.   4. Trivial posterior perivalvular leak. The aortic valve has been  repaired/replaced. Aortic valve regurgitation is trivial. No aortic  stenosis is present. There is a 29 mm Edwards Sapien prosthetic (TAVR)  valve present in the aortic position. Procedure  Date: 07/09/24. Aortic valve mean gradient measures 4.0 mmHg. Aortic valve  Vmax measures 1.37 m/s. Aortic valve acceleration time measures 99 msec.   ____________________  PPM implant 07/11/24 DESCRIPTION OF PROCEDURE: Informed written consent was obtained and the patient was brought to the electrophysiology lab in the fasting state. Prophylactic antibiotics were given. The patient was adequately sedated with intravenous Versed , and fentanyl  as outlined in  the nursing report. The patient's left chest was prepped and draped in the usual sterile fashion by the EP lab staff. The skin overlying the left deltopectoral region was infiltrated with lidocaine  for local analgesia. An incision was created over the left deltopectoral region. A left prepectoral pocket was fashioned using a combination of sharp and blunt dissection. Electrocautery was used to assure hemostasis.   Lead Placement: The left axillary vein was cannulated using ultrasound guidance. Through the left axillary vein a Wholey wire was passed to the RV. Over the wire, a septal sheath was passed to the mid septum. An RV lead (model  UltiPace [id number], serial [id number]) was passed through the sheath and fixed to the RV septum. It was advanced until there was evidence of capture of the left sided conduction system (stim-lateral QRS time 52ms). There it displayed excelling pacing (0.9 V at 0.1ms) and sensing thresholds (10.9 mV) with an acceptable impedance (620 ohms). The lead was secured to the pectoral fascia. Next, the right atrial lead (model UltiPace [id number], serial [id number]) was advanced to the RA appendage where it displayed excellent pacing (0.7 V at 0.30ms) and sensing (1.4 mV) thresholds with an acceptable impedance (463 ohms). It was secured to the pectoral fascia. The pocket was irrigated with copious gentamicin solution. The leads were then connected to a pulse generator (model Assurity MRI [id number], serial [id number]). The pocket was closed in layers of absorbable suture. EBL < 10mL. Steri-strips and a sterile dressing were applied.  During this procedure the patient is administered a total of Versed  1 mg and Fentanyl  50 mcg to achieve and maintain moderate conscious sedation. The patient's heart rate, blood pressure, and oxygen saturation are monitored continuously during the procedure. I was present face-to-face 100% of the time sedation was administered.   CONCLUSIONS:   1. Successful dual chamber pacemaker with LBBAP lead implant.  2.  No early apparent complications.    History of Present Illness     Ryan [name] is a 73 y.o. male with a history of CAD s/p CABG x 3 LIMA-LAD, free LRA to RI, SVG-RCA (2014), DMT2 without insulin , stage 2 infiltrating ductal carcinoma of breast s/p Tamoxifen  and R mastectomy 2012, Hodgkin lymphoma s/p mantle XRT 1983, CKD stage IIIb, HFrEF, HLD, asthma, bilateral carotid artery stenosis, RBBB with LAFB, asthma, phrenic nerve palsy, severe MAC with mitral valve stenosis, and severe AS with AI who presented to [facility] on 07/08[facility]24 for planned TAVR.   He had previously developed worsening  exertional shortness of breath and fatigue as well as some episodes of dizziness. Echo on 04/03/24 showed EF 40%, mod RV dilation, mild dysfunction, LFLG AS with mean grad 23 mmHg, AVA 1.09 cm2, SVI 33, mod-severe eccentric AI, severe MAC with severe mitral stenosis and dilated aortic root 44mm. [facility] 05/20/24 showed severe native CAD with patent bypass grafts. PA pressure was 53/36 with a mean of 36 with a wedge pressure of 20 mmHg and V waves to 22 mmHg. Cardiac index was 2.2. RAP 11, PAPI 1.5. Given elevated RA pressure and decreased PA pulsatility index, his lasix  was increased to 20mg  BID.   The patient was evaluated by the multidisciplinary valve team and felt to have severe, symptomatic aortic stenosis and to be a suitable candidate for TAVR, which was set up for 07/08/24. PAT labs showed creat 1.6-->2.21 and Lasix  was held.   Hospital Course     Consultants: none   Severe AS:  -- S/p successful TAVR with a 29 mm Celestia  Sapien 3 Ultra Resilia THV via the TF approach on 07/08/24.  -- Post operative echo showed EF 60%, mild LVH, mod RVE, mod-severe MAC with mod MS and normally functioning TAVR with a mean gradient of 4 mmHg and trivial PVL. -- Groin sites are stable. -- Currently in atrial fibrillation--> will keep on ASA for now and can address stopping at follow-up appointment since not starting Eliquis until 07/17/2024. -- Met with cardiac rehab to discuss CRP phase II.  -- Plan for discharge home today with close follow up in the outpatient setting.   RBBB with, 1st deg AV block and LAFB: -- Transient advanced heart block with TAVR requiring continued pacer support.  -- S/p Abbott dual-chamber PPM on 07/11/2024 by Dr. Kennyth. Post-procedure CXR showed no pneumothorax.   Acute on chronic HFimpEF: -- EF 40%--> 60-65% on POD 1 echo. -- LVEDP 20 mm hg at the time of TAVR.  -- Treated with one dose of IV lasix  20mg  x 1. -- Will resume home Lasix  at 20 mg daily (reduced from 20mg  BID). -- BMET  at follow up.  AKI in the setting of CKD stage IIIb:  -- Creat 2.21 on PAT labs and lasix  held.  -- Creatinine at 1.62 at the time of hospital discharge and will need a BMET at outpatient appt.  -- Discharge weight at 187 lbs. Restarted Lasix  at 20mg  daily.    PAF: -- New onset afib noted on tele.  -- CHADSVASC of at least 5. -- Will need to be started on Eliquis at discharge to start when cleared from a pacemaker standpoint --> Evaluated by EP and informed to start Eliquis on 07/17/2024.   HTN:  -- BP well controlled.  -- Started on Losartan 25mg  for renal protection with CKD and Toprol -XL 12.5mg  daily. -- Restarted Lasix  at 20mg  daily.    CAD s/p CABG: -- [facility] 05/20/24 showed severe native CAD with patent bypass grafts.  -- Continue medical therapy.  -- Continue Simvastatin  80mg  daily.    Mitral valve disease: -- Severe MAC with mitral valve stenosis.  Fever:  - On 07/13/2024, noted to be febrile to 102. Discharge cancelled. Blood cultures, UA and CXR obtained and showed ***.   _____________  Discharge Vitals Blood pressure (!) 154/74, pulse (!) 115, temperature 98.5 F (36.9 C), temperature source Oral, resp. rate (!) 28, height 6' 3 (1.905 m), weight 85 kg, SpO2 97%.  Filed Weights   07/11/24 0500 07/12/24 0700 07/13/24 0500  Weight: 89 kg 85.1 kg 85 kg    Disposition   Pt is being discharged home today in good condition.  Follow-up Plans & Appointments     Follow-up Information     [name], PA-C. Go on 07/17/2024.   Specialties: Cardiology, Radiology Why: @ 1:30 pm, please arrive at least 20 minutes early. Contact information: [address] [place] [address] [phone]                Discharge Instructions     Amb Referral to Cardiac Rehabilitation   Complete by: As directed    Diagnosis: Valve Replacement   Valve: Aortic   After initial evaluation and assessments completed: Virtual Based Care may be provided alone or in  conjunction with Phase 2 Cardiac Rehab based on patient barriers.: Yes   Intensive Cardiac Rehabilitation (ICR) [facility] location only OR Traditional Cardiac Rehabilitation (TCR) *If criteria for ICR are not met will enroll in TCR ([facility] only): Yes   Diet - low sodium heart  healthy   Complete by: As directed    Discharge wound care:   Complete by: As directed    See AVS       Discharge Medications   Allergies as of 07/13/2024   No Known Allergies      Medication List     STOP taking these medications    simvastatin  80 MG tablet Commonly known as: ZOCOR  Replaced by: atorvastatin  40 MG tablet       TAKE these medications    acetaminophen  500 MG tablet Commonly known as: TYLENOL  Take 500 mg by mouth every 6 (six) hours as needed for mild pain.   ALPRAZolam  1 MG tablet Commonly known as: XANAX  Take 1 mg by mouth at bedtime.   apixaban 5 MG Tabs tablet Commonly known as: ELIQUIS Take 1 tablet (5 mg total) by mouth 2 (two) times daily. Start taking on: July 17, 2024 Notes to patient: DO NOT start until Thursday (07/17/2024)!   aspirin  EC 81 MG tablet Take 81 mg by mouth every evening.   atorvastatin  40 MG tablet Commonly known as: LIPITOR  Take 1 tablet (40 mg total) by mouth daily. Start taking on: July 14, 2024 Replaces: simvastatin  80 MG tablet   busPIRone 5 MG tablet Commonly known as: BUSPAR Take 5 mg by mouth daily.   citalopram  20 MG tablet Commonly known as: CELEXA  Take 40 mg by mouth every evening.   docusate sodium  100 MG capsule Commonly known as: COLACE Take 100 mg by mouth 2 (two) times a week.   famotidine  20 MG tablet Commonly known as: Pepcid  One after supper   folic acid  400 MCG tablet Commonly known as: FOLVITE  Take 400 mcg by mouth daily.   furosemide  20 MG tablet Commonly known as: Lasix  Take 1 tablet (20 mg total) by mouth daily. What changed: when to take this   levothyroxine  75 MCG tablet Commonly known as: SYNTHROID  Take 75  mcg by mouth at bedtime.   losartan 25 MG tablet Commonly known as: COZAAR Take 1 tablet (25 mg total) by mouth daily. Start taking on: July 14, 2024   Magnesium  500 MG Tabs Take 500 mg by mouth 3 (three) times a week.   metoprolol  succinate 25 MG 24 hr tablet Commonly known as: TOPROL -XL Take 0.5 tablets (12.5 mg total) by mouth 2 (two) times daily.   nitroGLYCERIN  0.4 MG SL tablet Commonly known as: NITROSTAT  Place 1 tablet (0.4 mg total) under the tongue every 5 (five) minutes as needed for chest pain.   OVER THE COUNTER MEDICATION Take 2 tablets by mouth daily. Ultra kidney complex   OVER THE COUNTER MEDICATION Take 1 tablet by mouth 3 (three) times a week. Herbal laxative otc supplement   pantoprazole  40 MG tablet Commonly known as: Protonix  Take 1 tablet (40 mg total) by mouth daily. Take 30-60 min before first meal of the day   pramipexole 0.5 MG tablet Commonly known as: MIRAPEX Take 0.5 mg by mouth daily.   RESTFUL LEGS SL Place 1 tablet under the tongue at bedtime.   SOOTHE XP OP Place 1 drop into both eyes daily as needed (dry eyes).   Symbicort  80-4.5 MCG/ACT inhaler Generic drug: budesonide -formoterol  Inhale 2 puffs into the lungs 2 (two) times daily.   TURMERIC PO Take 800 mg by mouth daily.   Ventolin  HFA 108 (90 Base) MCG/ACT inhaler Generic drug: albuterol  Inhale 2 puffs into the lungs as needed for wheezing or shortness of breath.   vitamin B-12 500 MCG tablet  Commonly known as: CYANOCOBALAMIN  Take 500 mcg by mouth daily.   Vitamin D3 50 MCG (2000 UT) capsule Take 2,000 Units by mouth daily.               Discharge Care Instructions  (From admission, onward)           Start     Ordered   07/13/24 0000  Discharge wound care:       Comments: See AVS   07/13/24 1019                Outstanding Labs/Studies   BMET at Follow-up.   Duration of Discharge Encounter: APP Time: 32 minutes    Signed, Laymon CHRISTELLA Qua, PA-C 07/13/2024, 10:21 AM [phone]

## 2024-07-08 NOTE — Discharge Instructions (Signed)
 After Your Pacemaker (PLEASE NOT SOME INSTRUCTIONS/RESTRICTIONS MAY OVERLAP BETWEEN YOUR PACEMAKER AND VALVE CARE, PLEASE FOLLOW THE INSTRUCTIONS THAT HAVE THE LONGEST RESTRICTION)   If you have a Medtronic or Biotronik device, plug in your home monitor once you get home, and no manual interaction is required.   If you have an Abbott or [employer] device, plug your home monitor once you get home, sit near the device, and press the large activation button. Sit nearby until the process is complete, usually notated by lights on the monitor.   If you were set up for monitoring using an app on your phone, make sure the app remains open in the background and the Bluetooth remains on.  ACTIVITY Do not lift your arm above shoulder height for 1 week after your procedure. After 7 days, you may progress as below.  You should remove your sling 24 hours after your procedure, unless otherwise instructed by your provider.     Friday July 18, 2024  Saturday July 19, 2024 Sunday July 20, 2024 Monday July 21, 2024   Do not lift, push, pull, or carry anything over 10 pounds with the affected arm until 6 weeks (Friday August 22, 2024 ) after your procedure.   You may drive AFTER your wound check, unless you have been told otherwise by your provider.   Ask your healthcare provider when you can go back to work   INCISION/Dressing If you are on a blood thinner such as Coumadin , Xarelto, Eliquis, Plavix, or Pradaxa please confirm with your provider when this should be resumed.   If large square, outer bandage is left in place, this can be removed after 24 hours from your procedure. Do not remove steri-strips or glue as below.   If a PRESSURE DRESSING (a bulky dressing that usually goes up over your shoulder) was applied or left in place, please follow instructions given by your provider on when to return to have this removed.   Monitor your Pacemaker site for redness, swelling, and  drainage. Call the device clinic at [phone] if you experience these symptoms or fever/chills.  If your incision is sealed with Steri-strips or staples, you may shower 7 days after your procedure or when told by your provider. Do not remove the steri-strips or let the shower hit directly on your site. You may wash around your site with soap and water .    If you were discharged in a sling, please do not wear this during the day more than 48 hours after your surgery unless otherwise instructed. This may increase the risk of stiffness and soreness in your shoulder.   Avoid lotions, ointments, or perfumes over your incision until it is well-healed.  You may use a hot tub or a pool AFTER your wound check appointment if the incision is completely closed.  Pacemaker Alerts:  Some alerts are vibratory and others beep. These are NOT emergencies. Please call our office to let us  know. If this occurs at night or on weekends, it can wait until the next business day. Send a remote transmission.  If your device is capable of reading fluid status (for heart failure), you will be offered monthly monitoring to review this with you.   DEVICE MANAGEMENT Remote monitoring is used to monitor your pacemaker from home. This monitoring is scheduled every 91 days by our office. It allows us  to keep an eye on the functioning of your device to ensure it is working properly. You will routinely see your Electrophysiologist  annually (more often if necessary).  This will appear as a REMOTE check on your MyChart schedule. These are automatic and there is nothing for you to manually do unless otherwise instructed.  You should receive your ID card for your new device in 4-8 weeks. Keep this card with you at all times once received. Consider wearing a medical alert bracelet or necklace.  Your Pacemaker may be MRI compatible. This will be discussed at your next office visit/wound check.  You should avoid contact with strong  electric or magnetic fields.   Do not use amateur (ham) radio equipment or electric (arc) welding torches. MP3 player headphones with magnets should not be used. Some devices are safe to use if held at least 12 inches (30 cm) from your Pacemaker. These include power tools, lawn mowers, and speakers. If you are unsure if something is safe to use, ask your health care provider.  When using your cell phone, hold it to the ear that is on the opposite side from the Pacemaker. Do not leave your cell phone in a pocket over the Pacemaker.  You may safely use electric blankets, heating pads, computers, and microwave ovens.  Call the office right away if: You have chest pain. You feel more short of breath than you have felt before. You feel more light-headed than you have felt before. Your incision starts to open up.  This information is not intended to replace advice given to you by your health care provider. Make sure you discuss any questions you have with your health care provider.    ACTIVITY AND EXERCISE  Daily activity and exercise are an important part of your recovery. People recover at different rates depending on their general health and type of valve procedure.  Most people recovering from TAVR feel better relatively quickly   No lifting, pushing, pulling more than 10 pounds (examples to avoid: groceries, vacuuming, gardening, golfing):             - For one week with a procedure through the groin.             - For six weeks for procedures through the chest wall or neck. NOTE: You will typically see one of our providers 7-14 days after your procedure to discuss WHEN TO RESUME the above activities.      DRIVING  Do not drive until you are seen for follow up and cleared by a provider. Generally, we ask patient to not drive for 1 week after their procedure.  If you have been told by your doctor in the past that you may not drive, you must talk with him/her before you begin driving  again.   DRESSING  Groin site: you may leave the clear dressing over the site until follow up or remove 3-5 days post procedure.   HYGIENE  If you had a femoral (leg) procedure, you may take a shower when you return home. After the shower, pat the site dry. Do NOT use powder, oils or lotions in your groin area until the site has completely healed.   If you had a chest procedure, you may shower when you return home unless specifically instructed not to by your discharging practitioner.             - DO NOT scrub incision; pat dry with a towel.             - DO NOT apply any lotions, oils, powders to the incision.             -  No tub baths / swimming for at least 2 weeks.  If you notice any fevers, chills, increased pain, swelling, bleeding or pus, please contact your provider.   ADDITIONAL INFORMATION  If you are going to have an upcoming dental procedure, please contact our office as you will require antibiotics ahead of time to prevent infection on your heart valve.    If you have any questions or concerns you can call the structural heart phone during normal business hours 8am-4pm. If you have an urgent need after hours or weekends please call [phone] to talk to the on call provider for general cardiology. If you have an emergency that requires immediate attention, please call 911.    After TAVR Checklist  Check  Test Description   Follow up appointment in 1-2 weeks  You will see our structural heart advanced practice provider. Your incision sites will be checked and you will be cleared to drive and resume all normal activities if you are doing well.     1 month echo and follow up (23-75 days) You will have an echo to check on your new heart valve and be seen back in the office by a structural heart advanced practice provider.   Follow up with your primary cardiologist You will need to be seen by your primary cardiologist in the following 3-6 months after your 1 month appointment in  the valve clinic.    1 year echo and follow up (60 days +/- the anniversary of your TAVR) You will have another echo to check on your heart valve after 1 year and be seen back in the office by a structural heart advanced practice provider. This your last structural heart visit.   Bacterial endocarditis prophylaxis  You will have to take antibiotics for the rest of your life before all dental procedures (even teeth cleanings) to protect your heart valve. Antibiotics are also required before some surgeries. Please check with your cardiologist before scheduling any surgeries. Also, please make sure to tell us  if you have a penicillin allergy as you will require an alternative antibiotic.

## 2024-07-08 NOTE — Interval H&P Note (Signed)
 History and Physical Interval Note:  07/08/2024 11:23 AM  Ryan [name]  has presented today for surgery, with the diagnosis of Severe Aortic Stenosis.  The various methods of treatment have been discussed with the patient and family. After consideration of risks, benefits and other options for treatment, the patient has consented to  Procedure(s): Transcatheter Aortic Valve Replacement, Transfemoral (Right) ECHOCARDIOGRAM, TRANSTHORACIC ([name]) as a surgical intervention.  The patient's history has been reviewed, patient examined, no change in status, stable for surgery.  I have reviewed the patient's chart and labs.  Questions were answered to the patient's satisfaction.     Emillie Chasen K Taira Knabe

## 2024-07-08 NOTE — Op Note (Signed)
 [facility]  TAVR OPERATIVE NOTE   Date of Procedure:  07/08/2024  Preoperative Diagnosis: Severe Aortic Stenosis   Postoperative Diagnosis: Same   Procedure:   Transcatheter Aortic Valve Replacement - Transfemoral Approach  Edwards Sapien 3 Resilia THV (size 29 mm, model # [id number])   Co-Surgeons:   Dorise LOIS Fellers, MD, [name], MD and Lurena Red, MD Anesthesiologist:  [place]  Echocardiographer:  [place]  Pre-operative Echo Findings: Severe aortic stenosis Normal left ventricular systolic function  Post-operative Echo Findings: No paravalvular leak Normal left ventricular systolic function  Left Heart Catheterization Findings: Left ventricular end-diastolic pressure of   BRIEF CLINICAL NOTE AND INDICATIONS FOR SURGERY  The patient is a 73 year old male with a history of coronary artery disease status post CABG with a LIMA to LAD, left radial to ramus and vein graft to RCA, type 2 diabetes, stage II infiltrating ductal carcinoma of the breast status post tamoxifen  and right mastectomy, Hodgkin's lymphoma status post mantle radiation, CKD stage IIIa, hyperlipidemia, asthma, trifascicular block, severe MAC with mitral stenosis, and severe symptomatic aortic valvular disease consisting of severe aortic stenosis and moderate to severe aortic insufficiency who was referred for elective transcatheter aortic valve replacement with a 29 mm SAPIEN 3 valve.  During the course of the patient's preoperative work up they have been evaluated comprehensively by a multidisciplinary team of specialists coordinated through the Multidisciplinary [facility]ve Clinic in the [facility].  They have been demonstrated to suffer from symptomatic severe aortic stenosis as noted above. The patient has been counseled extensively as to the relative risks and benefits of all options for the treatment of severe aortic stenosis including long term medical therapy,  conventional surgery for aortic valve replacement, and transcatheter aortic valve replacement.  The patient has been independently evaluated by Dr. Fellers with CT surgery and they are felt to be at high risk for conventional surgical aortic valve replacement. The surgeon indicated the patient would be a poor candidate for conventional surgery. Based upon review of all of the patient's preoperative diagnostic tests they are felt to be candidate for transcatheter aortic valve replacement using the transfemoral approach as an alternative to high risk conventional surgery.    Following the decision to proceed with transcatheter aortic valve replacement, a discussion has been held regarding what types of management strategies would be attempted intraoperatively in the event of life-threatening complications, including whether or not the patient would be considered a candidate for the use of cardiopulmonary bypass and/or conversion to open sternotomy for attempted surgical intervention.  The patient has been advised of a variety of complications that might develop peculiar to this approach including but not limited to risks of death, stroke, paravalvular leak, aortic dissection or other major vascular complications, aortic annulus rupture, device embolization, cardiac rupture or perforation, acute myocardial infarction, arrhythmia, heart block or bradycardia requiring permanent pacemaker placement, congestive heart failure, respiratory failure, renal failure, pneumonia, infection, other late complications related to structural valve deterioration or migration, or other complications that might ultimately cause a temporary or permanent loss of functional independence or other long term morbidity.  The patient provides full informed consent for the procedure as described and all questions were answered preoperatively.    DETAILS OF THE OPERATIVE PROCEDURE  PREPARATION:   The patient is brought to the operating room  on the above mentioned date and central monitoring was established by the anesthesia team. The patient is placed in the supine position on  the operating table.  Intravenous antibiotics are administered. Conscious sedation is used.   Baseline transthoracic echocardiogram was performed. The patient's chest, abdomen, both groins, and both lower extremities are prepared and draped in a sterile manner. A time out procedure is performed.   PERIPHERAL ACCESS:   Using the modified Seldinger technique, femoral arterial and venous access were obtained with placement of a 6 Fr sheath in the left common femoral artery and a 6 Fr sheath in the right jugular vein using u/s guidance.  A pigtail diagnostic catheter was passed through the femoral arterial sheath under fluoroscopic guidance into the aortic root.  A temporary transvenous pacemaker catheter was passed through the jugular venous sheath under fluoroscopic guidance into the right ventricle.  The pacemaker was tested to ensure stable lead placement and pacemaker capture. Aortic root angiography was performed in order to determine the optimal angiographic angle for valve deployment.  TRANSFEMORAL ACCESS:  A micropuncture kit was used to gain access to the right common femoral artery using u/s guidance. Position confirmed with angiography. Pre-closure with double ProGlide closure devices. The patient was heparinized systemically and ACT verified > 250 seconds.    A 16 Fr transfemoral E-sheath was introduced into the right common femoral artery after progressively dilating over an Amplatz superstiff wire. An AL-1 catheter was used to direct a straight-tip exchange length wire across the native aortic valve into the left ventricle. This was exchanged out for a pigtail catheter and position was confirmed in the LV apex. Simultaneous left ventricular, aortic, and left ventricular end-diastolic pressures were recorded.  The pigtail catheter was then exchanged for an  Safari wire in the LV apex.    TRANSCATHETER HEART VALVE DEPLOYMENT:  An Edwards Sapien 3 THV (size 29 mm) was prepared and crimped per manufacturer's guidelines, and the proper orientation of the valve is confirmed on the [employer] delivery system. The valve was advanced through the introducer sheath using normal technique until in an appropriate position in the abdominal aorta beyond the sheath tip. The balloon was then retracted and using the fine-tuning wheel was centered on the valve. The valve was then advanced across the aortic arch using appropriate flexion of the catheter. The valve was carefully positioned across the aortic valve annulus. The Commander catheter was retracted using normal technique. Once final position of the valve has been confirmed by angiographic assessment, the valve is deployed while temporarily holding ventilation and during rapid ventricular pacing to maintain systolic blood pressure < 50 mmHg and pulse pressure < 10 mmHg. The balloon inflation is held for >3 seconds after reaching full deployment volume. Once the balloon has fully deflated the balloon is retracted into the ascending aorta and valve function is assessed using TTE. There is felt to be no paravalvular leak and no central aortic insufficiency.  The patient's hemodynamic recovery following valve deployment is good.  The deployment balloon and guidewire are both removed. Echo demostrated acceptable post-procedural gradients, stable mitral valve function, and no AI.   PROCEDURE COMPLETION:  The sheath was then removed and closure devices were completed. Protamine  was administered once femoral arterial repair was complete. The temporary pacemaker was secured in place, the pigtail catheters and femoral sheaths were removed with  a Mynx closure device placed in the artery and manual pressure used for venous hemostasis.    The patient tolerated the procedure well and is transported to the surgical intensive  care in stable condition. There were no immediate intraoperative complications. All sponge instrument and needle  counts are verified correct at completion of the operation.   No blood products were administered during the operation.  The patient received a total of 65 mL of intravenous contrast during the procedure.  Isolde Skaff K Lucynda Rosano MD 07/08/2024 1:34 PM

## 2024-07-08 NOTE — Anesthesia Postprocedure Evaluation (Signed)
 Anesthesia Post Note  Patient: Ryan [name]  Procedure(s) Performed: Transcatheter Aortic Valve Replacement, Transfemoral (Right) ECHOCARDIOGRAM, TRANSTHORACIC     Patient location during evaluation: PACU Anesthesia Type: MAC Level of consciousness: awake and alert Pain management: pain level controlled Vital Signs Assessment: post-procedure vital signs reviewed and stable Respiratory status: spontaneous breathing, nonlabored ventilation, respiratory function stable and patient connected to nasal cannula oxygen Cardiovascular status: stable and blood pressure returned to baseline Postop Assessment: no apparent nausea or vomiting Anesthetic complications: no   There were no known notable events for this encounter.  Last Vitals:  Vitals:   07/08/24 1545 07/08/24 1600  BP: (!) 104/55 (!) 99/46  Pulse: (!) 52 (!) 54  Resp: 20 (!) 21  Temp:    SpO2: 97% 96%    Last Pain:  Vitals:   07/08/24 1320  TempSrc:   PainSc: Asleep                 [name]

## 2024-07-08 NOTE — Op Note (Signed)
 [facility]  TAVR OPERATIVE NOTE     Date of Procedure:                07/08/2024   Preoperative Diagnosis:      Severe Aortic Stenosis    Postoperative Diagnosis:    Same    Procedure:        Transcatheter Aortic Valve Replacement - Transfemoral Approach             Edwards Sapien 3 Resilia THV (size 29 mm, model # [id number])              Co-Surgeons:                         Dorise LOIS Fellers, MD, [name], MD and Lurena Red, MD Anesthesiologist:                  [place]   Echocardiographer:              [place]   Pre-operative Echo Findings: Severe aortic stenosis Normal left ventricular systolic function   Post-operative Echo Findings: No paravalvular leak Normal left ventricular systolic function   Left Heart Catheterization Findings: Left ventricular end-diastolic pressure of     BRIEF CLINICAL NOTE AND INDICATIONS FOR SURGERY   The patient is a 73 year old male with a history of coronary artery disease status post CABG with a LIMA to LAD, left radial to ramus and vein graft to RCA, type 2 diabetes, stage II infiltrating ductal carcinoma of the breast status post tamoxifen  and right mastectomy, Hodgkin's lymphoma status post mantle radiation, CKD stage IIIa, hyperlipidemia, asthma, trifascicular block, severe MAC with mitral stenosis, and severe symptomatic aortic valvular disease consisting of severe aortic stenosis and moderate to severe aortic insufficiency who was referred for elective transcatheter aortic valve replacement with a 29 mm SAPIEN 3 valve.   During the course of the patient's preoperative work up they have been evaluated comprehensively by a multidisciplinary team of specialists coordinated through the Multidisciplinary Heart Valve Clinic in the [facility].  They have been demonstrated to suffer from symptomatic severe aortic stenosis as noted above. The patient has been counseled extensively as to the relative  risks and benefits of all options for the treatment of severe aortic stenosis including long term medical therapy, conventional surgery for aortic valve replacement, and transcatheter aortic valve replacement.  The patient has been independently evaluated by Dr. Fellers with CT surgery and they are felt to be at high risk for conventional surgical aortic valve replacement. The surgeon indicated the patient would be a poor candidate for conventional surgery. Based upon review of all of the patient's preoperative diagnostic tests they are felt to be candidate for transcatheter aortic valve replacement using the transfemoral approach as an alternative to high risk conventional surgery.     Following the decision to proceed with transcatheter aortic valve replacement, a discussion has been held regarding what types of management strategies would be attempted intraoperatively in the event of life-threatening complications, including whether or not the patient would be considered a candidate for the use of cardiopulmonary bypass and/or conversion to open sternotomy for attempted surgical intervention.  The patient has been advised of a variety of complications that might develop peculiar to this approach including but not limited to risks of death, stroke, paravalvular leak, aortic dissection or other major vascular complications, aortic annulus rupture, device embolization, cardiac rupture or perforation,  acute myocardial infarction, arrhythmia, heart block or bradycardia requiring permanent pacemaker placement, congestive heart failure, respiratory failure, renal failure, pneumonia, infection, other late complications related to structural valve deterioration or migration, or other complications that might ultimately cause a temporary or permanent loss of functional independence or other long term morbidity.  The patient provides full informed consent for the procedure as described and all questions were answered  preoperatively.       DETAILS OF THE OPERATIVE PROCEDURE   PREPARATION:   The patient is brought to the operating room on the above mentioned date and central monitoring was established by the anesthesia team. The patient is placed in the supine position on the operating table.  Intravenous antibiotics are administered. Conscious sedation is used.    Baseline transthoracic echocardiogram was performed. The patient's chest, abdomen, both groins, and both lower extremities are prepared and draped in a sterile manner. A time out procedure is performed.     PERIPHERAL ACCESS:   Using the modified Seldinger technique, femoral arterial and venous access were obtained with placement of a 6 Fr sheath in the left common femoral artery and a 6 Fr sheath in the right jugular vein using u/s guidance.  A pigtail diagnostic catheter was passed through the femoral arterial sheath under fluoroscopic guidance into the aortic root.  A temporary transvenous pacemaker catheter was passed through the jugular venous sheath under fluoroscopic guidance into the right ventricle.  The pacemaker was tested to ensure stable lead placement and pacemaker capture. Aortic root angiography was performed in order to determine the optimal angiographic angle for valve deployment.   TRANSFEMORAL ACCESS:  A micropuncture kit was used to gain access to the right common femoral artery using u/s guidance. Position confirmed with angiography. Pre-closure with double ProGlide closure devices. The patient was heparinized systemically and ACT verified > 250 seconds.     A 16 Fr transfemoral E-sheath was introduced into the right common femoral artery after progressively dilating over an Amplatz superstiff wire. An AL-1 catheter was used to direct a straight-tip exchange length wire across the native aortic valve into the left ventricle. This was exchanged out for a pigtail catheter and position was confirmed in the LV apex. Simultaneous left  ventricular, aortic, and left ventricular end-diastolic pressures were recorded.  The pigtail catheter was then exchanged for an Safari wire in the LV apex.     TRANSCATHETER HEART VALVE DEPLOYMENT:  An Edwards Sapien 3 THV (size 29 mm) was prepared and crimped per manufacturer's guidelines, and the proper orientation of the valve is confirmed on the [employer] delivery system. The valve was advanced through the introducer sheath using normal technique until in an appropriate position in the abdominal aorta beyond the sheath tip. The balloon was then retracted and using the fine-tuning wheel was centered on the valve. The valve was then advanced across the aortic arch using appropriate flexion of the catheter. The valve was carefully positioned across the aortic valve annulus. The Commander catheter was retracted using normal technique. Once final position of the valve has been confirmed by angiographic assessment, the valve is deployed while temporarily holding ventilation and during rapid ventricular pacing to maintain systolic blood pressure < 50 mmHg and pulse pressure < 10 mmHg. The balloon inflation is held for >3 seconds after reaching full deployment volume. Once the balloon has fully deflated the balloon is retracted into the ascending aorta and valve function is assessed using TTE. There is felt to be no paravalvular leak and  no central aortic insufficiency.  The patient's hemodynamic recovery following valve deployment is good.  The deployment balloon and guidewire are both removed. Echo demostrated acceptable post-procedural gradients, stable mitral valve function, and no AI. Heart rate was slow in the 40s so the temporary pacer was left in place.   PROCEDURE COMPLETION:  The sheath was then removed and closure devices were completed. Protamine  was administered once femoral arterial repair was complete. The temporary pacemaker was secured in place, the pigtail catheters and femoral sheaths  were removed with  a Mynx closure device placed in the artery and manual pressure used for venous hemostasis.     The patient tolerated the procedure well and is transported to the surgical intensive care in stable condition. There were no immediate intraoperative complications. All sponge instrument and needle counts are verified correct at completion of the operation.    No blood products were administered during the operation.   The patient received a total of 65 mL of intravenous contrast during the procedure.  [name], MD 07/08/24 5:56 PM

## 2024-07-08 NOTE — Anesthesia Procedure Notes (Signed)
 Arterial Line Insertion Start/End9/30/2025 9:58 AM, 07/08/2024 10:01 AM Performed by: [place] Franky BIRCH, MD, anesthesiologist  Patient location: Pre-op. Preanesthetic checklist: patient identified, IV checked, site marked, risks and benefits discussed, surgical consent, monitors and equipment checked, pre-op evaluation, timeout performed and anesthesia consent Lidocaine  1% used for infiltration Right, radial was placed Catheter size: 20 G Hand hygiene performed  and maximum sterile barriers used   Attempts: 1 Procedure performed without using ultrasound guided technique. Following insertion, dressing applied and Biopatch. Post procedure assessment: normal and unchanged  Post procedure complications: second provider assisted. Patient tolerated the procedure well with no immediate complications.

## 2024-07-08 NOTE — Transfer of Care (Signed)
 Immediate Anesthesia Transfer of Care Note  Patient: Ryan [name]  Procedure(s) Performed: Transcatheter Aortic Valve Replacement, Transfemoral (Right) ECHOCARDIOGRAM, TRANSTHORACIC  Patient Location: ICU  Anesthesia Type:MAC  Level of Consciousness: sedated and drowsy  Airway & Oxygen Therapy: Patient Spontanous Breathing and Patient connected to face mask oxygen  Post-op Assessment: Report given to RN and Post -op Vital signs reviewed and stable  Post vital signs: Reviewed and stable  Last Vitals:  Vitals Value Taken Time  BP 106/52 1337  Temp    Pulse 54   Resp 20   SpO2 98%     Last Pain:  Vitals:   07/08/24 1320  TempSrc:   PainSc: Asleep      Patients Stated Pain Goal: 4 (07/08/24 0734)  Complications: There were no known notable events for this encounter.

## 2024-07-09 ENCOUNTER — Other Ambulatory Visit: Payer: Self-pay

## 2024-07-09 ENCOUNTER — Inpatient Hospital Stay (HOSPITAL_COMMUNITY)

## 2024-07-09 DIAGNOSIS — I10 Essential (primary) hypertension: Secondary | ICD-10-CM | POA: Diagnosis not present

## 2024-07-09 DIAGNOSIS — I4510 Unspecified right bundle-branch block: Secondary | ICD-10-CM

## 2024-07-09 DIAGNOSIS — N183 Chronic kidney disease, stage 3 unspecified: Secondary | ICD-10-CM

## 2024-07-09 DIAGNOSIS — I442 Atrioventricular block, complete: Secondary | ICD-10-CM | POA: Diagnosis not present

## 2024-07-09 DIAGNOSIS — Z952 Presence of prosthetic heart valve: Secondary | ICD-10-CM

## 2024-07-09 LAB — BASIC METABOLIC PANEL WITH GFR
Anion gap: 10 (ref 5–15)
BUN: 15 mg/dL (ref 8–23)
CO2: 23 mmol/L (ref 22–32)
Calcium: 8.4 mg/dL — ABNORMAL LOW (ref 8.9–10.3)
Chloride: 103 mmol/L (ref 98–111)
Creatinine, Ser: 1.34 mg/dL — ABNORMAL HIGH (ref 0.61–1.24)
GFR, Estimated: 55 mL/min — ABNORMAL LOW (ref >60)
Glucose, Bld: 112 mg/dL — ABNORMAL HIGH (ref 70–99)
Potassium: 4.1 mmol/L (ref 3.5–5.1)
Sodium: 136 mmol/L (ref 135–145)

## 2024-07-09 LAB — ECHOCARDIOGRAM COMPLETE
AR max vel: 3.51 cm2
AV Area VTI: 3.85 cm2
AV Area mean vel: 3.59 cm2
AV Mean grad: 4 mmHg
AV Peak grad: 7.5 mmHg
Ao pk vel: 1.37 m/s
Area-P 1/2: 1.24 cm2
Height: 75 in
S' Lateral: 2.7 cm
Weight: 3153.46 [oz_av]

## 2024-07-09 LAB — CBC
HCT: 37 % — ABNORMAL LOW (ref 39.0–52.0)
Hemoglobin: 12.2 g/dL — ABNORMAL LOW (ref 13.0–17.0)
MCH: 29.8 pg (ref 26.0–34.0)
MCHC: 33 g/dL (ref 30.0–36.0)
MCV: 90.2 fL (ref 80.0–100.0)
Platelets: 176 K/uL (ref 150–400)
RBC: 4.1 MIL/uL — ABNORMAL LOW (ref 4.22–5.81)
RDW: 14.6 % (ref 11.5–15.5)
WBC: 9.8 K/uL (ref 4.0–10.5)
nRBC: 0 % (ref 0.0–0.2)

## 2024-07-09 LAB — MAGNESIUM: Magnesium: 1.8 mg/dL (ref 1.7–2.4)

## 2024-07-09 LAB — POCT ACTIVATED CLOTTING TIME: Activated Clotting Time: 112 s

## 2024-07-09 MED ORDER — LOSARTAN POTASSIUM 25 MG PO TABS
25.0000 mg | ORAL_TABLET | Freq: Every day | ORAL | Status: DC
  Administered 2024-07-09 – 2024-07-13 (×4): 25 mg via ORAL
  Filled 2024-07-09 (×5): qty 1

## 2024-07-09 MED ORDER — MAGNESIUM SULFATE 2 GM/50ML IV SOLN
2.0000 g | Freq: Once | INTRAVENOUS | Status: AC
  Administered 2024-07-09: 2 g via INTRAVENOUS
  Filled 2024-07-09: qty 50

## 2024-07-09 NOTE — Consult Note (Cosign Needed Addendum)
 Cardiology Consultation   Patient ID: Ryan [name] MRN: [id number]; DOB: [birth year]  Admit date: 07/08/2024 Date of Consult: 07/09/2024  PCP:  Shona Norleen PEDLAR, MD   [facility] Providers Cardiologist:  [name], MD  Structural Heart:  [name], MD {   Patient Profile: Ryan [name] is a 73 y.o. male with a hx of  Hodgkin's lymphoma (s/p XRT), Breast ca (remote) s/p mastectomy/chemo HTN, HLD, CKD (IIIa) PVD (carotid dz, distal R ICA occl) CAD (CABG 2014) VHD (severe AS and severe MAC)  who is being seen 07/09/2024 for the evaluation of transient CHB post TAVR at the request of Dr. Red.  History of Present Illness: Ryan [name] was admitted yesterday to undergo TAVR, with valve deployment developed heart block requiring pacing support via the temp wire.  Given baseline conduction system disease > EP was brought on board  He feels well this morning, no CP, SOB  LABS K+ 4.1 BUN/Creat 15/1.34 Mag 1.8 WBC 9.8 H/H 12.2/37 Plts 176   Past Medical History:  Diagnosis Date   Adie's pupil    Anemia 07/14/2013   Borderline diabetes    CAD (coronary artery disease), native coronary artery 04/09/2013   Dist LM stenosis 70-80% (at trifurcation into LAD, CX, & 2 Ramus branches, 70% mid RCA);; b) Myoview  08/06/14: Low Risk, no Ischemia/infarction   Depression 1983   after I was dx'd w/CA; now just comes in spells  (12/22/2015)   Essential hypertension    Exertional shortness of breath    Hodgkin's lymphoma (HCC) 1983   Hypercholesterolemia    Hypothyroidism    Invasive ductal carcinoma of breast, stage 2 (HCC) 04/27/2011   chemo, mastectomy   Left carotid artery partial occlusion 04/27/2011   CAROTIC DOPPLER 11/2013: < 40% R ICA stenosis, distal waveforms damped - suggests probably intracranial occlusoin, < 40% LICA, normal vertebrals -- no change   Mild aortic stenosis by prior echocardiogram 02/2018   By echo June 2020: Mild-Mod AS (Mean  Gradient 13.5 mmHg)   Mild mitral stenosis by prior echocardiogram 08/06/2014   Most recent echo June 2020: Estimated valve area 2.83 cm (in 2019 was roughly 2 cm).  Mean gradient 6-8 mmHg.  Appears to be stable.   Personal history of chemotherapy    Pre-diabetes    Restless leg syndrome    Right carotid artery occlusion 04/27/2011   S/P CABG x 3 04/09/2013   LIMA-LAD, fLRAD-RI, SVG-RCA; Echo 10/29/'15:  mild Conc LVH, no WMA, Gr 1 DD, Mod AoV Sclerosis w/ Mod AI, ~Mild-Mod MS    S/P TAVR (transcatheter aortic valve replacement) 07/08/2024   s/p TAVR with a 29 mm Edwards Sapien 3 Ultra Resilia THV via the TF approach by Dr. Red and Dr. Paige   Sleep apnea    Stroke [facility])    2007   Thyroid  disease     Past Surgical History:  Procedure Laterality Date   BIOPSY  09/08/2020   Procedure: BIOPSY;  Surgeon: Golda Claudis PENNER, MD;  [facility]: [facility];  Service: Endoscopy;;  colon   BIOPSY  12/09/2021   Procedure: BIOPSY;  Surgeon: Eartha Angelia Sieving, MD;  [facility]: [facility];  Service: Gastroenterology;;   BREAST BIOPSY Right 2012   CARDIAC CATHETERIZATION  2014   CATARACT EXTRACTION W/PHACO Right 06/21/2015   Procedure: CATARACT EXTRACTION PHACO AND INTRAOCULAR LENS PLACEMENT (IOC);  Surgeon: Cherene Mania, MD;  Location: [facility];  Service: Ophthalmology;  Laterality: Right;  CDE: 7.05  CATARACT EXTRACTION W/PHACO Left 07/01/2015   Procedure: CATARACT EXTRACTION PHACO AND INTRAOCULAR LENS PLACEMENT (IOC);  Surgeon: Cherene Mania, MD;  Location: [facility];  Service: Ophthalmology;  Laterality: Left;  CDE: 7.49   COLONOSCOPY [name] 05/06/2015   Procedure: COLONOSCOPY;  Surgeon: Claudis [name] Rivet, MD;  [facility];  Service: Endoscopy;  Laterality: [name];  930   COLONOSCOPY WITH PROPOFOL  [name] 09/08/2020   Procedure: COLONOSCOPY WITH PROPOFOL ;  Surgeon: Rivet Claudis [name], MD;  [facility];  Service: Endoscopy;  Laterality: [name];  915   CORONARY ARTERY BYPASS GRAFT  [name] 04/08/2013   Procedure: CORONARY ARTERY BYPASS GRAFTING (CABG);  Surgeon: Maude Fleeta Ochoa, MD;  Location[facility] OR;  Service: Open Heart Surgery;  Laterality: [name];  Times 3 using left internal mammary artery; endoscopically harvested right saphenous vein and left radial artery   ESOPHAGOGASTRODUODENOSCOPY (EGD) WITH PROPOFOL  [name] 12/09/2021   Procedure: ESOPHAGOGASTRODUODENOSCOPY (EGD) WITH PROPOFOL ;  Surgeon: Eartha Angelia Sieving, MD;  [facility];  Service: Gastroenterology;  Laterality: [name];  1250 ASA 2   INTRAOPERATIVE TRANSESOPHAGEAL ECHOCARDIOGRAM [name] 04/08/2013   Procedure: INTRAOPERATIVE TRANSESOPHAGEAL ECHOCARDIOGRAM;  Surgeon: Maude Fleeta Ochoa, MD;  Location: [facility] OR;  Service: Open Heart Surgery;  Laterality: [name];   INTRAOPERATIVE TRANSTHORACIC ECHOCARDIOGRAM [name] 07/08/2024   Procedure: ECHOCARDIOGRAM, TRANSTHORACIC;  Surgeon: Wendel Lurena POUR, MD;  Location: [facility] CV LAB;  Service: Cardiovascular;  Laterality: [name];   LAPAROSCOPIC APPENDECTOMY [name] 12/23/2015   Procedure: APPENDECTOMY LAPAROSCOPIC;  Surgeon: Donnice Lima, MD;  Location: [facility] OR;  Service: General;  Laterality: [name];   LEFT HEART CATHETERIZATION WITH CORONARY ANGIOGRAM [name] 04/07/2013   Procedure: LEFT HEART CATHETERIZATION WITH CORONARY ANGIOGRAM;  Surgeon: Alm LELON Clay, MD;  Location: [facility] CATH LAB;  Service: Cardiovascular;  Laterality: [name];   LYMPH NODE BIOPSY Left 1983   neck   MASTECTOMY Right 2012   w/axillary lymph node dissection   NM MYOVIEW  LTD  08/2019    EF 55 to 60%.  LOW RISK.  No ischemia or infarction noted.   NM TREADMILL MYOVIEW  LTD  08/06/2014   Exercise 5:07 min, 6.3 METS; symptoms noted or extreme dyspnea and lightheadedness with some chest tightness. No EKG changes. No ischemia or infarction was normal EF.   POLYPECTOMY  09/08/2020   Procedure: POLYPECTOMY;  Surgeon: Rivet Claudis [name], MD;  [facility];  Service: Endoscopy;;   RADIAL ARTERY HARVEST Left 04/08/2013    Procedure: RADIAL ARTERY HARVEST;  Surgeon: Maude Fleeta Ochoa, MD;  Location: [facility] OR;  Service: Open Heart Surgery;  Laterality: Left;   RIGHT HEART CATH AND CORONARY ANGIOGRAPHY [name] 05/20/2024   Procedure: RIGHT HEART CATH AND CORONARY ANGIOGRAPHY;  Surgeon: Wendel Lurena POUR, MD;  [facility];  Service: Cardiovascular;  Laterality: [name];   SAVORY DILATION  12/09/2021   Procedure: SAVORY DILATION;  Surgeon: Eartha Angelia Sieving, MD;  [facility];  Service: Gastroenterology;;   TONSILLECTOMY  1974   TRANSTHORACIC ECHOCARDIOGRAM  08/06/2014; 02/2018   a) Nl EF 55-60%. Gr 1 DD-high LVEDP. Mod AoV Sclerosis w/o AS. Mild AI; mild MS - mean grad 7 mmHg @ HR 122 bpm.;; b) Initial: EF 55-60%, GRII DD.  HighLAP/LVEDP.  ? Bicuspid AoV -severely Ca2+.  Mild Ao root dilation.  Severe MAC, mild MS (MVA by P1/2 T & continuity equation ~2 cm).  Mod RV dilation w/ mild- mod reduced EF.---> Limited w/Defiinity: No RWMA, Mild AS, Mod MS (~mean gradient 8 mmHg   TRANSTHORACIC ECHOCARDIOGRAM  02/2020   Normal LV size and function.  EF 65 to 70% no R WMA.  Mild LVH with basal septum.  GRII DD with elevated LVEDP.  Moderate reduced RV function with mild RV dilation, but normal PA pressures.  Mild LA dilation.  Moderate mitral stenosis, moderate AS & AI.  Mild MR, Mod MS (mean gradient 9 mmHg).  Moderate calcified AS w/ Mod AI -> Mean gradient 17.3 mmHg with peak 32 mm.   TRANSTHORACIC ECHOCARDIOGRAM  03/2019    EF 60-65%, mild concentric LVH. Gr 2 DD. No RWMA. Mild-Mod RV dilation & enlargement. Mild LA dilation. Mod MV Calcification . Mod MS (previously read as mild, but mean gradient is lower than and estimated valve area is higher than last check).  Mild-Mod AS (Mean Gradient 13.5 mmHg)     Home Medications:  Prior to Admission medications   Medication Sig Start Date End Date Taking? Authorizing Provider  acetaminophen  (TYLENOL ) 500 MG tablet Take 500 mg by mouth every 6 (six) hours as  needed for mild pain.    Yes [provider]  albuterol  (VENTOLIN  HFA) 108 (90 Base) MCG/ACT inhaler Inhale 2 puffs into the lungs as needed for wheezing or shortness of breath. 01/29/24  Yes [name], MD  ALPRAZolam  (XANAX ) 1 MG tablet Take 1 mg by mouth at bedtime.   Yes [provider]  Artificial Tear Solution (SOOTHE XP OP) Place 1 drop into both eyes daily as needed (dry eyes).   Yes [provider]  aspirin  EC 81 MG tablet Take 81 mg by mouth every evening. 09/09/20  Yes Rehman, Claudis PENNER, MD  busPIRone (BUSPAR) 5 MG tablet Take 5 mg by mouth daily. 05/06/24  Yes [provider]  Cholecalciferol (VITAMIN D3) 50 MCG (2000 UT) capsule Take 2,000 Units by mouth daily.   Yes [provider]  citalopram  (CELEXA ) 20 MG tablet Take 40 mg by mouth every evening. 11/28/23  Yes [provider]  docusate sodium  (COLACE) 100 MG capsule Take 100 mg by mouth 2 (two) times a week.   Yes [provider]  famotidine  (PEPCID ) 20 MG tablet One after supper 04/16/24  Yes [name], MD  folic acid  (FOLVITE ) 400 MCG tablet Take 400 mcg by mouth daily.   Yes [provider]  furosemide  (LASIX ) 20 MG tablet Take 1 tablet (20 mg total) by mouth 2 (two) times daily. 05/20/24  Yes Thukkani, Arun K, MD  Homeopathic Products (RESTFUL LEGS SL) Place 1 tablet under the tongue at bedtime.   Yes [provider]  levothyroxine  (SYNTHROID ) 75 MCG tablet Take 75 mcg by mouth at bedtime. 10/31/23  Yes [provider]  Magnesium  500 MG TABS Take 500 mg by mouth 3 (three) times a week.   Yes [provider]  nitroGLYCERIN  (NITROSTAT ) 0.4 MG SL tablet Place 1 tablet (0.4 mg total) under the tongue every 5 (five) minutes as needed for chest pain. 12/04/19  Yes [name], MD  OVER THE COUNTER MEDICATION Take 2 tablets by mouth daily. Ultra kidney complex   Yes [provider]  OVER THE COUNTER MEDICATION Take 1 tablet  by mouth 3 (three) times a week. Herbal laxative otc supplement   Yes [provider]  pantoprazole  (PROTONIX ) 40 MG tablet Take 1 tablet (40 mg total) by mouth daily. Take 30-60 min before first meal of the day 04/16/24  Yes [name], MD  pramipexole (MIRAPEX) 0.5 MG tablet Take 0.5 mg by  mouth daily. 12/02/23  Yes [provider]  simvastatin  (ZOCOR ) 80 MG tablet Take 80 mg by mouth every evening.  10/13/19  Yes [provider]  SYMBICORT  80-4.5 MCG/ACT inhaler Inhale 2 puffs into the lungs 2 (two) times daily. 02/21/24  Yes [provider]  TURMERIC PO Take 800 mg by mouth daily.   Yes [provider]  vitamin B-12 (CYANOCOBALAMIN ) 500 MCG tablet Take 500 mcg by mouth daily.   Yes [provider]    Scheduled Meds:  ALPRAZolam   1 mg Oral QHS   aspirin  EC  81 mg Oral QPM   atorvastatin   40 mg Oral Daily   busPIRone  5 mg Oral Daily   citalopram   40 mg Oral QPM   famotidine   20 mg Oral QHS   fluticasone  furoate-vilanterol  1 puff Inhalation Daily   levothyroxine   75 mcg Oral QHS   pantoprazole   40 mg Oral Daily   pramipexole  0.5 mg Oral Daily   sodium chloride  flush  3 mL Intravenous Q12H   Continuous Infusions:  sodium chloride      nitroGLYCERIN  15 mcg/min (07/09/24 0730)   norepinephrine  (LEVOPHED ) Adult infusion Stopped (07/08/24 1546)   PRN Meds: sodium chloride , acetaminophen  **OR** acetaminophen , morphine  injection, ondansetron  (ZOFRAN ) IV, oxyCODONE , sodium chloride  flush, traMADol   Allergies:   No Known Allergies  Social History:   Social History   Socioeconomic History   Marital status: Married    Spouse name: Not on file   Number of children: 2   Years of education: Not on file   Highest education level: Not on file  Occupational History   Not on file  Tobacco Use   Smoking status: Former    Current packs/day: 0.00    Average packs/day: 1 pack/day for 15.0 years (15.0 ttl pk-yrs)    Types: Cigarettes     Start date: 06/10/1967    Quit date: 06/09/1982    Years since quitting: 42.1   Smokeless tobacco: Never  Vaping Use   Vaping status: Never Used  Substance and Sexual Activity   Alcohol use: No   Drug use: No   Sexual activity: Never  Other Topics Concern   Not on file  Social History Narrative   Married. Former smoker who quit in 1983.   No routine exercise - gained ~40lb post CABG.   Social Drivers of Corporate investment banker Strain: Not on file  Food Insecurity: Not on file  Transportation Needs: Not on file  Physical Activity: Not on file  Stress: Not on file  Social Connections: Not on file  Intimate Partner Violence: Not on file    Family History:   Family History  Problem Relation Age of Onset   Alzheimer's disease Mother    Stroke Mother    Hypertension Mother    Hyperlipidemia Mother    Diabetes Mother    Hypertension Father    Diabetes Paternal Grandmother    Diabetes Paternal Grandfather    Breast cancer Paternal Aunt    Ovarian cancer Paternal Aunt      ROS:  Please see the history of present illness.  All other ROS reviewed and negative.     Physical Exam/Data: Vitals:   07/09/24 0715 07/09/24 0730 07/09/24 0745 07/09/24 0800  BP:    (!) 131/58  Pulse: 85 82 86 75  Resp: (!) 21 (!) 22 15 (!) 22  Temp:      TempSrc:      SpO2: 92% 93% 93%  94%  Weight:      Height:        Intake/Output Summary (Last 24 hours) at 07/09/2024 0814 Last data filed at 07/09/2024 0730 Gross per 24 hour  Intake 1229.73 ml  Output 2050 ml  Net -820.27 ml      07/09/2024    5:00 AM 07/08/2024    7:12 AM 06/30/2024    8:48 AM  Last 3 Weights  Weight (lbs) 197 lb 1.5 oz 194 lb 199 lb 12.8 oz  Weight (kg) 89.4 kg 87.998 kg 90.629 kg     Body mass index is 24.63 kg/m.  General:  Well nourished, well developed, in no acute distress HEENT: normal Neck: no JVD R internal jugular sit is stable Vascular: No carotid bruits Cardiac:  RRR; no murmurs, gallops or  rubs Lungs:  CTA b/l, no wheezing, rhonchi or rales  Abd: soft, nontender  Ext: no edema Musculoskeletal:  No deformities Skin: warm and dry  Neuro:  no gross focal abnormalities noted Psych:  Normal affect   EKG:  The EKG was personally reviewed and demonstrates:   Low voltage P waves at baseline, looks SR 80bpm  Telemetry:  Telemetry was personally reviewed and demonstrates:   Difficult with low voltage P wves, though this morning has clear evidence of 1:1 conduction SR  Relevant CV Studies:  07/08/24: TTE 1. TTE guided TAVR. 29 mm S3. Normal prosthesis. No regurgitation or PVL.  The aortic valve has been repaired/replaced. Aortic valve regurgitation is  not visualized. There is a 29 mm Sapien prosthetic (TAVR) valve present in  the aortic position.  Procedure Date: 07/08/2024. Aortic valve area, by VTI measures 3.77 cm.  Aortic valve mean gradient measures 2.0 mmHg. Aortic valve Vmax measures  1.02 m/s.   2. Left ventricular ejection fraction, by estimation, is 50 to 55%. The  left ventricle has low normal function.   3. Right ventricular systolic function is mildly reduced. The right  ventricular size is mildly enlarged.   4. Left atrial size was mild to moderately dilated.   5. The mitral valve is degenerative. Mild mitral valve regurgitation.  Severe mitral stenosis. The mean mitral valve gradient is 10.5 mmHg with  average heart rate of 72 bpm.    05/20/24: LHC   Mid LM to Dist LM lesion is 90% stenosed.   Ost LAD lesion is 80% stenosed.   1st Mrg lesion is 50% stenosed.   Prox RCA to Mid RCA lesion is 90% stenosed.   1.  Patent LIMA to LAD, left radial to ramus, and vein graft to RCA grafts. 2.  Severe native vessel disease. 3.  Fick cardiac output of 4.8 L/min and Fick cardiac index of 2.2 L/min/m with the following hemodynamics:            Right atrial pressure mean of 11 mmHg            Right ventricular pressure 65/22 with end-diastolic pressure of 10 mmHg             PA pressure 53/36 with a mean of 36 mmHg            Wedge pressure mean of 20 mmHg with V waves to 22 mmHg            PVR of 3.35            PA pulsatility index of 1.5 4.  Capacious iliofemoral vessels bilaterally   Recommendation: Continue evaluation for aortic valve intervention.  Increase  Lasix  to 20 mg twice daily and check BMP in 1 week given elevated RA pressure and decreased PA pulsatility index.   Laboratory Data: High Sensitivity Troponin:  No results for input(s): TROPONINIHS in the last 720 hours.   Chemistry Recent Labs  Lab 07/04/24 0906 07/08/24 0754 07/09/24 0500  NA 138 142 136  K 3.4* 3.9 4.1  CL 99 103 103  CO2 27 25 23   GLUCOSE 80 108* 112*  BUN 29* 14 15  CREATININE 2.21* 1.46* 1.34*  CALCIUM  9.0 8.7* 8.4*  MG  --   --  1.8  GFRNONAA 30* 50* 55*  ANIONGAP 12 14 10     Recent Labs  Lab 07/04/24 0906  PROT 6.8  ALBUMIN  4.0  AST 41  ALT 34  ALKPHOS 56  BILITOT 1.2   Lipids No results for input(s): CHOL, TRIG, HDL, LABVLDL, LDLCALC, CHOLHDL in the last 168 hours.  Hematology Recent Labs  Lab 07/04/24 0906 07/09/24 0500  WBC 11.3* 9.8  RBC 4.49 4.10*  HGB 13.2 12.2*  HCT 41.1 37.0*  MCV 91.5 90.2  MCH 29.4 29.8  MCHC 32.1 33.0  RDW 14.4 14.6  PLT 223 176   Thyroid  No results for input(s): TSH, FREET4 in the last 168 hours.  BNPNo results for input(s): BNP, PROBNP in the last 168 hours.  DDimer No results for input(s): DDIMER in the last 168 hours.  Radiology/Studies:    Assessment and Plan:  Conduction system disease Baseline RBBB, 1st degree AVBlock w/LAD Transient advanced heart block with TAVR requiring pacing support  Overnight > this morning he has regained 1:1 conduction this morning Was no on BB pre-TAVR Would avoid nodal blocking agents Keep temp wire in today and monitor over the next 24 hours If no bradycardia, advanced heart block or pacing support use could consider d/c with live  monitoring tomorrow Low threshold for PPM implant of any brady events/pacing  For questions or updates, please contact [facility] Please consult [email or web address] for contact info under    Signed, Charlies Macario Arthur, PA-C  07/09/2024 8:14 AM   I have seen, examined the patient, and reviewed the above assessment and plan.    HPI: Ryan [name] is a 73 year old male with a past medical history notable for Hodgkin's lymphoma (s/p XRT), Breast ca (remote) s/p mastectomy/chemo, HTN, HLD, CKD (IIIa), PVD (carotid dz, distal R ICA occl), CAD (CABG 2014), VHD (severe AS and severe MAC) who was admitted for planned TAVR on 9/30.  Post valve deployment he had complete heart block.  Temporary pacing wire was left in place.  He was transferred to the cardiac ICU for monitoring.  EP has been consulted.  General: Well developed, in no acute distress.  Neck: No JVD. RIJ temp wire. Cardiac: Normal rate, regular rhythm.  Resp: Normal work of breathing.  Ext: No edema.  Neuro: No gross focal deficits.  Psych: Normal affect.   Assessment and Plan:  Ryan [name] is a 73 year old male with baseline conduction disease who underwent TAVR which was complicated by complete heart block in the immediate postoperative period.   #CHB s/p TAVR: Overnight, patient recovered conduction and ceased ventricular pacing.  He was primarily conducting one-to-one.  Intermittently would have blocked PACs. - Leave temporary pacemaker in place and monitor for another 24 hours.  If he continues to have one-to-one AV conduction, then would be able to be discharged with live Zio monitor.  If he has recurrence of any degree of advanced AV block  requiring pacing then we will have a low threshold to proceed with permanent pacemaker implant prior to discharge.  [name], MD, [facility], [facility] Cardiac Electrophysiology

## 2024-07-09 NOTE — Progress Notes (Addendum)
 [facility]   MULTIDISCIPLINARY HEART VALVE TEAM  Patient Name: Ryan [name] Date of Encounter: 07/09/2024  Admit date: 07/08/2024   PCP:  Shona Norleen PEDLAR, MD  [facility] Cardiologist:  [name], MD  [facility] Structural heart: [name], MD [facility] Electrophysiologist:  None   Hospital Problem List     Principal Problem:   S/P TAVR (transcatheter aortic valve replacement) Active Problems:   Invasive ductal carcinoma of right breast, stage 2- 2012 s/p chemo and mastectomy   Hodgkin's disease-1983   Hyperlipidemia with target LDL less than 70   Orthostatic hypotension   Coronary atherosclerosis of native coronary artery - multivessel CAD, status post CABG x3 (LIMA-LAD, Left Radial-RI, SVG-RCA)   Carotid artery stenosis   Essential hypertension   Severe aortic stenosis   Trifascicular block     Subjective   No complaints.   Inpatient Medications    Scheduled Meds:  ALPRAZolam   1 mg Oral QHS   aspirin  EC  81 mg Oral QPM   atorvastatin   40 mg Oral Daily   busPIRone  5 mg Oral Daily   citalopram   40 mg Oral QPM   famotidine   20 mg Oral QHS   fluticasone  furoate-vilanterol  1 puff Inhalation Daily   levothyroxine   75 mcg Oral QHS   pantoprazole   40 mg Oral Daily   pramipexole  0.5 mg Oral Daily   sodium chloride  flush  3 mL Intravenous Q12H   Continuous Infusions:  sodium chloride      nitroGLYCERIN  5 mcg/min (07/09/24 0925)   norepinephrine  (LEVOPHED ) Adult infusion Stopped (07/08/24 1546)   PRN Meds: sodium chloride , acetaminophen  **OR** acetaminophen , morphine  injection, ondansetron  (ZOFRAN ) IV, oxyCODONE , sodium chloride  flush, traMADol    Vital Signs    Vitals:   07/09/24 0815 07/09/24 0830 07/09/24 0845 07/09/24 0900  BP: (!) 145/50 (!) 140/60 (!) 135/55 (!) 147/58  Pulse: 68 83 76 81  Resp: (!) 26 (!) 32 19 11  Temp:      TempSrc:      SpO2: 92% 94% 95% 94%  Weight:      Height:        Intake/Output  Summary (Last 24 hours) at 07/09/2024 0947 Last data filed at 07/09/2024 0925 Gross per 24 hour  Intake 1236.14 ml  Output 2250 ml  Net -1013.86 ml   Filed Weights   07/08/24 0712 07/09/24 0500  Weight: 88 kg 89.4 kg    Physical Exam    GEN: Well nourished, well developed, in no acute distress.  HEENT: Grossly normal.  Neck: Supple, no JVD or masses. Rij in place Cardiac: RRR, no murmurs, rubs, or gallops. No clubbing, cyanosis, edema.   Respiratory:  Respirations regular and unlabored, clear to auscultation bilaterally. GI: Soft, nontender, nondistended, BS + x 4. MS: no deformity or atrophy. Skin: warm and dry, no rash.  Groin sites clear without hematoma or ecchymosis Neuro:  Strength and sensation are intact. Psych: AAOx3.  Normal affect.  Labs    CBC Recent Labs    07/09/24 0500  WBC 9.8  HGB 12.2*  HCT 37.0*  MCV 90.2  PLT 176   Basic Metabolic Panel: Recent Labs  Lab 07/04/24 0906 07/08/24 0754 07/09/24 0500  NA 138 142 136  K 3.4* 3.9 4.1  CL 99 103 103  CO2 27 25 23   GLUCOSE 80 108* 112*  BUN 29* 14 15  CREATININE 2.21* 1.46* 1.34*  CALCIUM  9.0 8.7* 8.4*  MG  --   --  1.8   GFR: Estimated Creatinine Clearance: 56.9 mL/min (A) (by C-G formula based on SCr of 1.34 mg/dL (H)). Recent Labs  Lab 07/04/24 0906 07/09/24 0500  WBC 11.3* 9.8    Telemetry    Sinus with occasional pacing spikes , HRs in 80s- Personally Reviewed  ECG    Sinus vs junctional with bifascicular block and PVC. P waves difficult to see but seen on tele.  - Personally Reviewed  Patient Profile     Ryan [name] is a 73 y.o. male with a history of CAD s/p CABG x 3 LIMA-LAD, free LRA to RI, SVG-RCA (2014), DMT2 without insulin , stage 2 infiltrating ductal carcinoma of breast s/p Tamoxifen  and R mastectomy 2012, Hodgkin lymphoma s/p mantle XRT 1983, CKD stage IIIb, HFrEF, HLD, asthma, bilateral carotid artery stenosis, RBBB with LAFB, asthma, phrenic nerve palsy, severe MAC  with mitral valve stenosis, and severe AS with AI who presented to [facility] on 07/08/24 for planned TAVR.   Assessment & Plan    Severe AS:  -- S/p successful TAVR with a 29 mm Edwards Sapien 3 Ultra Resilia THV via the TF approach on 07/08/24.  -- Post operative echo completed but pending formal read. -- Groin sites are stable. -- Continue Asprin 81 mg daily.  -- EP to see.    RBBB with, 1st deg AV block and LAFB: -- RIJ pacer kept in overnight and observed in ICU. -- In sinus with intermittent pacing on tele. ECG with possible junctional rhythm? -- EP to see.     Acute on chronic HFrEF: -- EF 40%. -- LVEDP 20 mm hg at the time of TAVR.  -- Treated with one dose of IV lasix  20mg  x 1.   AKI in the setting of CKD stage IIIb:  -- Creat 2.21 on PAT labs and lasix  held.  -- Creat improved to 1.34.  HTN:  -- BP elevated requiring IV ntg.  -- Stop IV nitroglycerin  and start Losartan 25mg  given CKD.   CAD s/p CABG: -- [facility] 05/20/24 showed severe native CAD with patent bypass grafts.  -- Continue medical therapy.  -- Continue Asprin 81 mg daily and simvastatin  80mg  daily.    Mitral valve disease: -- Severe MAC with mitral valve stenosis.  Bonney Lamarr Hummer, PA-C  07/09/2024, 9:47 AM  Pager [phone]   ATTENDING ATTESTATION:  After conducting a review of all available clinical information with the care team, interviewing the patient, and performing a physical exam, I agree with the findings and plan described in this note.   GEN: No acute distress, AO x 3 HEENT:  MMM, no JVD, no scleral icterus, right IJ temporary pacemaker Cardiac: RRR, no murmurs, rubs, or gallops.  Respiratory: Clear to auscultation bilaterally. GI: Soft, nontender, non-distended  MS: No edema; No deformity. Neuro:  Nonfocal  Vasc:  +2 radial pulses, access site stable  No acute events overnight.  Patient diuresed well in response to Lasix  given elevated LVEDP.  The patient has demonstrated sporadic  but not frequent pacing.  On my review of his telemetry it looks like he may be in a junctional rhythm.  Prior to TAVR he had trifascicular block with a first-degree AV block, right bundle branch block, and left anterior fascicular block.  Will obtain EP input for further guidance.  For now continue to heart care with temporary wire in place.  Follow-up echocardiogram today.  Continue aspirin  81 mg.  Will wean nitroglycerin  infusion off and start losartan given CKD stage III.  Lurena Red, MD Pager [phone]

## 2024-07-09 NOTE — Plan of Care (Signed)
  Problem: Education: Goal: Knowledge of General Education information will improve Description: Including pain rating scale, medication(s)/side effects and non-pharmacologic comfort measures Outcome: Progressing   Problem: Health Behavior/Discharge Planning: Goal: Ability to manage health-related needs will improve Outcome: Progressing   Problem: Clinical Measurements: Goal: Ability to maintain clinical measurements within normal limits will improve Outcome: Progressing Goal: Will remain free from infection Outcome: Progressing Goal: Diagnostic test results will improve Outcome: Progressing Goal: Respiratory complications will improve Outcome: Progressing Goal: Cardiovascular complication will be avoided Outcome: Progressing   Problem: Activity: Goal: Risk for activity intolerance will decrease Outcome: Progressing   Problem: Nutrition: Goal: Adequate nutrition will be maintained Outcome: Progressing   Problem: Coping: Goal: Level of anxiety will decrease Outcome: Progressing   Problem: Elimination: Goal: Will not experience complications related to urinary retention Outcome: Progressing   Problem: Pain Managment: Goal: General experience of comfort will improve and/or be controlled Outcome: Progressing   Problem: Safety: Goal: Ability to remain free from injury will improve Outcome: Progressing   Problem: Skin Integrity: Goal: Risk for impaired skin integrity will decrease Outcome: Progressing

## 2024-07-10 DIAGNOSIS — I440 Atrioventricular block, first degree: Secondary | ICD-10-CM

## 2024-07-10 DIAGNOSIS — I453 Trifascicular block: Secondary | ICD-10-CM | POA: Diagnosis not present

## 2024-07-10 DIAGNOSIS — Z952 Presence of prosthetic heart valve: Secondary | ICD-10-CM | POA: Diagnosis not present

## 2024-07-10 LAB — CBC
HCT: 36 % — ABNORMAL LOW (ref 39.0–52.0)
Hemoglobin: 11.5 g/dL — ABNORMAL LOW (ref 13.0–17.0)
MCH: 29 pg (ref 26.0–34.0)
MCHC: 31.9 g/dL (ref 30.0–36.0)
MCV: 90.9 fL (ref 80.0–100.0)
Platelets: 154 K/uL (ref 150–400)
RBC: 3.96 MIL/uL — ABNORMAL LOW (ref 4.22–5.81)
RDW: 14.5 % (ref 11.5–15.5)
WBC: 9.3 K/uL (ref 4.0–10.5)
nRBC: 0 % (ref 0.0–0.2)

## 2024-07-10 LAB — BASIC METABOLIC PANEL WITH GFR
Anion gap: 8 (ref 5–15)
BUN: 19 mg/dL (ref 8–23)
CO2: 24 mmol/L (ref 22–32)
Calcium: 8.2 mg/dL — ABNORMAL LOW (ref 8.9–10.3)
Chloride: 106 mmol/L (ref 98–111)
Creatinine, Ser: 1.65 mg/dL — ABNORMAL HIGH (ref 0.61–1.24)
GFR, Estimated: 43 mL/min — ABNORMAL LOW (ref >60)
Glucose, Bld: 113 mg/dL — ABNORMAL HIGH (ref 70–99)
Potassium: 3.8 mmol/L (ref 3.5–5.1)
Sodium: 138 mmol/L (ref 135–145)

## 2024-07-10 MED ORDER — CHLORHEXIDINE GLUCONATE CLOTH 2 % EX PADS
6.0000 | MEDICATED_PAD | Freq: Every day | CUTANEOUS | Status: DC
  Administered 2024-07-10 – 2024-07-12 (×3): 6 via TOPICAL

## 2024-07-10 MED ORDER — MELATONIN 3 MG PO TABS
3.0000 mg | ORAL_TABLET | ORAL | Status: DC | PRN
  Administered 2024-07-11: 3 mg via ORAL
  Filled 2024-07-10: qty 1

## 2024-07-10 MED ORDER — ALPRAZOLAM 0.5 MG PO TABS
1.5000 mg | ORAL_TABLET | Freq: Every evening | ORAL | Status: AC | PRN
Start: 2024-07-10 — End: ?
  Administered 2024-07-11: 1.5 mg via ORAL
  Filled 2024-07-10: qty 3

## 2024-07-10 MED ORDER — MELATONIN 3 MG PO TABS: 3.0000 mg | ORAL_TABLET | Freq: Every day | ORAL | Status: DC

## 2024-07-10 MED ORDER — MAGNESIUM SULFATE 2 GM/50ML IV SOLN: 2.0000 g | Freq: Once | INTRAVENOUS | Status: DC

## 2024-07-10 MED ORDER — ALPRAZOLAM 0.5 MG PO TABS: 1.5000 mg | ORAL_TABLET | Freq: Every day | ORAL | Status: DC

## 2024-07-10 MED ORDER — POTASSIUM CHLORIDE CRYS ER 20 MEQ PO TBCR
20.0000 meq | EXTENDED_RELEASE_TABLET | Freq: Once | ORAL | Status: AC
  Administered 2024-07-10: 20 meq via ORAL
  Filled 2024-07-10: qty 1

## 2024-07-10 MED ORDER — SODIUM CHLORIDE 0.9% FLUSH
3.0000 mL | Freq: Two times a day (BID) | INTRAVENOUS | Status: DC
  Administered 2024-07-10 – 2024-07-14 (×10): 3 mL via INTRAVENOUS

## 2024-07-10 NOTE — TOC Initial Note (Signed)
 Transition of Care [facility]) - Initial/Assessment Note    Patient Details  Name: Ryan [name] MRN: [id number] Date of Birth: March 11, 1951  [facility]) CM/SW Contact:    Sudie Erminio Deems, RN Phone Number: 07/10/2024, 3:16 PM  Clinical Narrative: Patient s/p TAVR-plan for PPM 07-11-24. PTA patient was from home with spouse. Daughter was at the bedside during the visit. Patient states he uses DME: cane and rolling walker. No home needs identified during the visit. Inpatient Case Manager will continues to follow for disposition needs.              Expected Discharge Plan: Home/Self Care Barriers to Discharge: Continued Medical Work up   Patient Goals and CMS Choice Patient states their goals for this hospitalization and ongoing recovery are:: Plan to return home once stable   Expected Discharge Plan and Services In-house Referral: NA Discharge Planning Services: CM Consult Post Acute Care Choice: NA Living arrangements for the past 2 months: Single Family Home                   DME Agency: NA  Prior Living Arrangements/Services Living arrangements for the past 2 months: Single Family Home Lives with:: Spouse (daughter at the bedside.) Patient language and need for interpreter reviewed:: Yes Do you feel safe going back to the place where you live?: Yes      Need for Family Participation in Patient Care: Yes (Comment) Care giver support system in place?: Yes (comment)   Criminal Activity/Legal Involvement Pertinent to Current Situation/Hospitalization: No - Comment as needed  Activities of Daily Living   ADL Screening (condition at time of admission) Independently performs ADLs?: Yes (appropriate for developmental age) Is the patient deaf or have difficulty hearing?: No Does the patient have difficulty seeing, even when wearing glasses/contacts?: No Does the patient have difficulty concentrating, remembering, or making decisions?: No  Permission  Sought/Granted Permission sought to share information with : Family Supports, Magazine features editor, Case Manager   Emotional Assessment Appearance:: Appears stated age Attitude/Demeanor/Rapport: Engaged Affect (typically observed): Appropriate Orientation: : Oriented to Self, Oriented to Place Alcohol / Substance Use: Not Applicable Psych Involvement: No (comment)  Admission diagnosis:  Aortic stenosis, severe [I35.0] S/P TAVR (transcatheter aortic valve replacement) [Z95.2] Patient Active Problem List   Diagnosis Date Noted   S/P TAVR (transcatheter aortic valve replacement) 07/08/2024   Trifascicular block 07/08/2024   Phrenic nerve palsy on R 01/03/2024   Mitral stenosis 08/21/2019   Severe aortic stenosis 01/31/2018   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) 09/08/2014   Essential hypertension 07/27/2014   Carotid artery stenosis 11/12/2013   Coronary atherosclerosis of native coronary artery - multivessel CAD, status post CABG x3 (LIMA-LAD, Left Radial-RI, SVG-RCA) 08/02/2013   Fatigue 08/02/2013   Orthostatic hypotension 07/14/2013   Anemia 07/14/2013   Claudication of calf muscles 04/02/2013   Hyperlipidemia with target LDL less than 70    Invasive ductal carcinoma of right breast, stage 2- 2012 s/p chemo and mastectomy 04/27/2011   Hodgkin's [id number] 04/27/2011   Known RICA occlusion (CVA '07) partial LICA stenosis (40-59%) Jan 2014 04/27/2011   PCP:  Shona Norleen PEDLAR, MD Pharmacy:   [facility] [address] [place] - [address] [place] [address] Phone: [phone] Fax: [phone]  Social Drivers of Health (SDOH) Social History: SDOH Screenings   Food Insecurity: No Food Insecurity (07/09/2024)  Housing: Low Risk  (07/09/2024)  Transportation Needs:  No Transportation Needs (07/09/2024)  Utilities: Not At Risk (07/09/2024)  Social Connections: Socially Integrated (07/09/2024)   Tobacco Use: Medium Risk (07/08/2024)   Readmission Risk Interventions     No data to display

## 2024-07-10 NOTE — Progress Notes (Signed)
 Rounding Note   Patient Name: Ryan [name] Date of Encounter: 07/10/2024  [facility] Cardiologist: Vishnu P Mallipeddi, MD   Subjective  Feels well, internal jugular site a little bothersome, not painful No CP, SOB  Scheduled Meds:  ALPRAZolam   1 mg Oral QHS   aspirin  EC  81 mg Oral QPM   atorvastatin   40 mg Oral Daily   busPIRone  5 mg Oral Daily   Chlorhexidine  Gluconate Cloth  6 each Topical Daily   citalopram   40 mg Oral QPM   famotidine   20 mg Oral QHS   fluticasone  furoate-vilanterol  1 puff Inhalation Daily   levothyroxine   75 mcg Oral QHS   losartan  25 mg Oral Daily   pantoprazole   40 mg Oral Daily   pramipexole  0.5 mg Oral Daily   sodium chloride  flush  3 mL Intravenous Q12H   Continuous Infusions:  sodium chloride      PRN Meds: sodium chloride , acetaminophen  **OR** acetaminophen , morphine  injection, ondansetron  (ZOFRAN ) IV, oxyCODONE , sodium chloride  flush, traMADol    Vital Signs  Vitals:   07/10/24 0300 07/10/24 0348 07/10/24 0400 07/10/24 0500  BP: (!) 124/107  (!) 154/57   Pulse: 80  83   Resp: (!) 21  (!) 27   Temp:  97.6 F (36.4 C)    TempSrc:  Oral    SpO2: 96%  91%   Weight:    85.1 kg  Height:        Intake/Output Summary (Last 24 hours) at 07/10/2024 0745 Last data filed at 07/10/2024 0600 Gross per 24 hour  Intake 156.76 ml  Output 1575 ml  Net -1418.24 ml      07/10/2024    5:00 AM 07/09/2024    5:00 AM 07/08/2024    7:12 AM  Last 3 Weights  Weight (lbs) 187 lb 9.8 oz 197 lb 1.5 oz 194 lb  Weight (kg) 85.1 kg 89.4 kg 87.998 kg      Telemetry  SR, 1st degree AVblock with intermittent V pacing at 50 this morning - Personally Reviewed  ECG   Difficult, though c/w SR, PACs, RBBB, LAD - Personally Reviewed  Physical Exam  GEN: No acute distress.   Neck: No JVD Temp wire site/stable Cardiac: RRR, no murmurs, rubs, or gallops.  Respiratory: Clear to auscultation bilaterally. GI: Soft, nontender, non-distended   MS: No edema; No deformity. Neuro:  Nonfocal  Psych: Normal affect   Labs High Sensitivity Troponin:  No results for input(s): TROPONINIHS in the last 720 hours.   Chemistry Recent Labs  Lab 07/04/24 0906 07/08/24 0754 07/09/24 0500 07/10/24 0223  NA 138 142 136 138  K 3.4* 3.9 4.1 3.8  CL 99 103 103 106  CO2 27 25 23 24   GLUCOSE 80 108* 112* 113*  BUN 29* 14 15 19   CREATININE 2.21* 1.46* 1.34* 1.65*  CALCIUM  9.0 8.7* 8.4* 8.2*  MG  --   --  1.8  --   PROT 6.8  --   --   --   ALBUMIN  4.0  --   --   --   AST 41  --   --   --   ALT 34  --   --   --   ALKPHOS 56  --   --   --   BILITOT 1.2  --   --   --   GFRNONAA 30* 50* 55* 43*  ANIONGAP 12 14 10 8     Lipids No results for input(s):  CHOL, TRIG, HDL, LABVLDL, LDLCALC, CHOLHDL in the last 168 hours.  Hematology Recent Labs  Lab 07/04/24 0906 07/09/24 0500 07/10/24 0223  WBC 11.3* 9.8 9.3  RBC 4.49 4.10* 3.96*  HGB 13.2 12.2* 11.5*  HCT 41.1 37.0* 36.0*  MCV 91.5 90.2 90.9  MCH 29.4 29.8 29.0  MCHC 32.1 33.0 31.9  RDW 14.4 14.6 14.5  PLT 223 176 154   Thyroid  No results for input(s): TSH, FREET4 in the last 168 hours.  BNPNo results for input(s): BNP, PROBNP in the last 168 hours.  DDimer No results for input(s): DDIMER in the last 168 hours.   Radiology    Cardiac Studies  07/09/24: TTE 1. Left ventricular ejection fraction, by estimation, is 60 to 65%. The  left ventricle has normal function. The left ventricle has no regional  wall motion abnormalities. There is mild left ventricular hypertrophy.  Left ventricular diastolic parameters  are indeterminate.   2. Right ventricular systolic function is mildly reduced. The right  ventricular size is moderately enlarged.   3. The mitral valve is abnormal. Trivial mitral valve regurgitation.  Moderate mitral stenosis. The mean mitral valve gradient is 8.0 mmHg with  average heart rate of 68 bpm. Moderate to severe mitral annular   calcification.   4. Trivial posterior perivalvular leak. The aortic valve has been  repaired/replaced. Aortic valve regurgitation is trivial. No aortic  stenosis is present. There is a 29 mm Edwards Sapien prosthetic (TAVR)  valve present in the aortic position. Procedure  Date: 07/09/24. Aortic valve mean gradient measures 4.0 mmHg. Aortic valve  Vmax measures 1.37 m/s. Aortic valve acceleration time measures 99 msec.    05/20/24: LHC   Mid LM to Dist LM lesion is 90% stenosed.   Ost LAD lesion is 80% stenosed.   1st Mrg lesion is 50% stenosed.   Prox RCA to Mid RCA lesion is 90% stenosed.   1.  Patent LIMA to LAD, left radial to ramus, and vein graft to RCA grafts. 2.  Severe native vessel disease. 3.  Fick cardiac output of 4.8 L/min and Fick cardiac index of 2.2 L/min/m with the following hemodynamics:            Right atrial pressure mean of 11 mmHg            Right ventricular pressure 65/22 with end-diastolic pressure of 10 mmHg            PA pressure 53/36 with a mean of 36 mmHg            Wedge pressure mean of 20 mmHg with V waves to 22 mmHg            PVR of 3.35            PA pulsatility index of 1.5 4.  Capacious iliofemoral vessels bilaterally   Recommendation: Continue evaluation for aortic valve intervention.  Increase Lasix  to 20 mg twice daily and check BMP in 1 week given elevated RA pressure and decreased PA pulsatility index.    Patient Profile   73 y.o. male w/PMHx of Hodgkin's lymphoma (s/p XRT), Breast ca (remote) s/p right mastectomy/chemo HTN, HLD, CKD (IIIa) PVD (carotid dz, distal R ICA occl) CAD (CABG 2014) VHD (severe AS and severe MAC)  Admitted for TAVR 07/08/24, developed transient CHB post valve deployment w/baseline trifascicular block  Assessment & Plan   Conduction system disease Baseline RBBB, 1st degree AVBlock w/LAD Transient advanced heart block with TAVR requiring pacing support  Though did regain 1:1 conduction. This morning  especially has developed increasing pacing requirements via his temp wire Will need PPM implant.  He has had chest XRT and R sided mastectomy  Temp wire threshold is <1 Remains programmed at 50bpm, 10mA  Will hold NPO for now, though lab schedule may not accommodate today     For questions or updates, please contact [facility] Please consult [email or web address] for contact info under     Signed, Charlies Macario Arthur, PA-C  07/10/2024, 7:45 AM

## 2024-07-10 NOTE — Progress Notes (Addendum)
 [facility]   MULTIDISCIPLINARY HEART VALVE TEAM  Patient Name: Ryan [name] Date of Encounter: 07/10/2024  Admit date: 07/08/2024   PCP:  Shona Norleen PEDLAR, MD  [facility] Cardiologist:  [name], MD  [facility] Structural heart: [name], MD [facility] Electrophysiologist:  None   Hospital Problem List     Principal Problem:   S/P TAVR (transcatheter aortic valve replacement) Active Problems:   Invasive ductal carcinoma of right breast, stage 2- 2012 s/p chemo and mastectomy   Hodgkin's disease-1983   Hyperlipidemia with target LDL less than 70   Orthostatic hypotension   Coronary atherosclerosis of native coronary artery - multivessel CAD, status post CABG x3 (LIMA-LAD, Left Radial-RI, SVG-RCA)   Carotid artery stenosis   Essential hypertension   Severe aortic stenosis   Trifascicular block     Subjective   Sitting up in chair with wife at bedside. He has no complaints. Awaiting pacemaker.   Inpatient Medications    Scheduled Meds:  ALPRAZolam   1 mg Oral QHS   aspirin  EC  81 mg Oral QPM   atorvastatin   40 mg Oral Daily   busPIRone  5 mg Oral Daily   Chlorhexidine  Gluconate Cloth  6 each Topical Daily   citalopram   40 mg Oral QPM   famotidine   20 mg Oral QHS   fluticasone  furoate-vilanterol  1 puff Inhalation Daily   levothyroxine   75 mcg Oral QHS   losartan  25 mg Oral Daily   pantoprazole   40 mg Oral Daily   pramipexole  0.5 mg Oral Daily   sodium chloride  flush  3 mL Intravenous Q12H   Continuous Infusions:  sodium chloride      PRN Meds: sodium chloride , acetaminophen  **OR** acetaminophen , morphine  injection, ondansetron  (ZOFRAN ) IV, oxyCODONE , sodium chloride  flush, traMADol    Vital Signs    Vitals:   07/10/24 0400 07/10/24 0500 07/10/24 0700 07/10/24 0800  BP: (!) 154/57  (!) 137/54 128/63  Pulse: 83  78 78  Resp: (!) 27  (!) 21 20  Temp:      TempSrc:      SpO2: 91%  94% 97%  Weight:  85.1 kg     Height:        Intake/Output Summary (Last 24 hours) at 07/10/2024 1021 Last data filed at 07/10/2024 0600 Gross per 24 hour  Intake 149.97 ml  Output 1375 ml  Net -1225.03 ml   Filed Weights   07/08/24 0712 07/09/24 0500 07/10/24 0500  Weight: 88 kg 89.4 kg 85.1 kg    Physical Exam    GEN: Well nourished, well developed, in no acute distress.  HEENT: Grossly normal.  Neck: Supple, no JVD or masses. Rij in place Cardiac: RRR, no murmurs, rubs, or gallops. No clubbing, cyanosis, edema.   Respiratory:  Respirations regular and unlabored, clear to auscultation bilaterally. GI: Soft, nontender, nondistended, BS + x 4. MS: no deformity or atrophy. Skin: warm and dry, no rash.  Groin sites clear without hematoma or ecchymosis Neuro:  Strength and sensation are intact. Psych: AAOx3.  Normal affect.  Labs    CBC Recent Labs    07/09/24 0500 07/10/24 0223  WBC 9.8 9.3  HGB 12.2* 11.5*  HCT 37.0* 36.0*  MCV 90.2 90.9  PLT 176 154   Basic Metabolic Panel: Recent Labs  Lab 07/04/24 0906 07/08/24 0754 07/09/24 0500 07/10/24 0223  NA 138 142 136 138  K 3.4* 3.9 4.1 3.8  CL 99 103 103 106  CO2 27 25 23 24   GLUCOSE 80 108* 112* 113*  BUN 29* 14 15 19   CREATININE 2.21* 1.46* 1.34* 1.65*  CALCIUM  9.0 8.7* 8.4* 8.2*  MG  --   --  1.8  --    GFR: Estimated Creatinine Clearance: 46.2 mL/min (A) (by C-G formula based on SCr of 1.65 mg/dL (H)). Recent Labs  Lab 07/04/24 0906 07/09/24 0500 07/10/24 0223  WBC 11.3* 9.8 9.3    Telemetry    Sinus with Vpacing this AM - Personally Reviewed  ECG    Sinus vs junctional with bifascicular block and PVC. P waves difficult to see but seen on tele.  - Personally Reviewed  Patient Profile     Ryan [name] is a 73 y.o. male with a history of CAD s/p CABG x 3 LIMA-LAD, free LRA to RI, SVG-RCA (2014), DMT2 without insulin , stage 2 infiltrating ductal carcinoma of breast s/p Tamoxifen  and R mastectomy 2012, Hodgkin lymphoma  s/p mantle XRT 1983, CKD stage IIIb, HFrEF, HLD, asthma, bilateral carotid artery stenosis, RBBB with LAFB, asthma, phrenic nerve palsy, severe MAC with mitral valve stenosis, and severe AS with AI who presented to [facility] on 07/08/24 for planned TAVR.   Assessment & Plan    Severe AS:  -- S/p successful TAVR with a 29 mm Edwards Sapien 3 Ultra Resilia THV via the TF approach on 07/08/24.  -- Post operative echo showed EF 60%, mild LVH, mod RVE, mod-severe MAC with mod MS and normally functioning TAVR with a mean gradient of 4 mmHg and trivial PVL. -- Groin sites are stable. -- Continue Asprin 81 mg daily.  -- Plan for PPM, hopefully later today.    RBBB with, 1st deg AV block and LAFB: -- Having bradycardia with increasing pacing requirements this AM. -- PPM recommended, hopefully later today if schedule allows.     Acute on chronic HFimpEF: -- EF 40%--> 60% on POD 1 echo. -- LVEDP 20 mm hg at the time of TAVR.  -- Treated with one dose of IV lasix  20mg  x 1. -- Appears euvolemic off diuretics currently.    AKI in the setting of CKD stage IIIb:  -- Creat 2.21 on PAT labs and lasix  held.  -- Creat improved to 1.34, but now up to 1.65 likely 2/2 ARB initiation.  -- I think we can keep him off diuretics.   HTN:  -- BP well controlled.  -- Started on Losartan 25mg  for renal protection with CKD.    CAD s/p CABG: -- [facility] 05/20/24 showed severe native CAD with patent bypass grafts.  -- Continue medical therapy.  -- Continue Asprin 81 mg daily and simvastatin  80mg  daily.    Mitral valve disease: -- Severe MAC with mitral valve stenosis.  SignedLamarr Hummer, PA-C  07/10/2024, 10:21 AM  Pager [phone]  ATTENDING ATTESTATION:  After conducting a review of all available clinical information with the care team, interviewing the patient, and performing a physical exam, I agree with the findings and plan described in this note.   GEN: No acute distress, AO x 3 HEENT:  MMM, no JVD, no  scleral icterus,: Right IJ temporary pacemaker in place Cardiac: RRR, no murmurs, rubs, or gallops.  Respiratory: Clear to auscultation bilaterally. GI: Soft, nontender, non-distended  MS: No edema; No deformity. Neuro:  Nonfocal  Vasc:  +2 radial pulses  Patient with occasional pacing yesterday.  Has a very long first-degree AV block.  After discussion with EP patient will be referred for  permanent pacemaker placement today.  TTE yesterday with reassuring valve function and only moderate mitral valve disease.  Continue current therapy.  Tentative discharge tomorrow if pacemaker implantation is uncomplicated.  Lurena Red, MD Pager [phone]

## 2024-07-11 ENCOUNTER — Inpatient Hospital Stay (HOSPITAL_COMMUNITY)
Admission: RE | Disposition: A | Discharge status: Home / Self Care | Payer: Self-pay | Source: Home / Self Care | Attending: Internal Medicine

## 2024-07-11 ENCOUNTER — Other Ambulatory Visit: Payer: Self-pay

## 2024-07-11 HISTORY — PX: PACEMAKER IMPLANT: EP1218

## 2024-07-11 LAB — BASIC METABOLIC PANEL WITH GFR
Anion gap: 4 — ABNORMAL LOW (ref 5–15)
BUN: 20 mg/dL (ref 8–23)
CO2: 26 mmol/L (ref 22–32)
Calcium: 8.4 mg/dL — ABNORMAL LOW (ref 8.9–10.3)
Chloride: 107 mmol/L (ref 98–111)
Creatinine, Ser: 1.31 mg/dL — ABNORMAL HIGH (ref 0.61–1.24)
GFR, Estimated: 57 mL/min — ABNORMAL LOW (ref >60)
Glucose, Bld: 121 mg/dL — ABNORMAL HIGH (ref 70–99)
Potassium: 4.7 mmol/L (ref 3.5–5.1)
Sodium: 137 mmol/L (ref 135–145)

## 2024-07-11 LAB — CBC
HCT: 35.1 % — ABNORMAL LOW (ref 39.0–52.0)
Hemoglobin: 11.4 g/dL — ABNORMAL LOW (ref 13.0–17.0)
MCH: 29.4 pg (ref 26.0–34.0)
MCHC: 32.5 g/dL (ref 30.0–36.0)
MCV: 90.5 fL (ref 80.0–100.0)
Platelets: 130 K/uL — ABNORMAL LOW (ref 150–400)
RBC: 3.88 MIL/uL — ABNORMAL LOW (ref 4.22–5.81)
RDW: 14.5 % (ref 11.5–15.5)
WBC: 9.7 K/uL (ref 4.0–10.5)
nRBC: 0 % (ref 0.0–0.2)

## 2024-07-11 LAB — SURGICAL PCR SCREEN
MRSA, PCR: NEGATIVE
Staphylococcus aureus: NEGATIVE

## 2024-07-11 LAB — MAGNESIUM: Magnesium: 2 mg/dL (ref 1.7–2.4)

## 2024-07-11 SURGERY — PACEMAKER IMPLANT
Anesthesia: LOCAL

## 2024-07-11 MED ORDER — CEFAZOLIN SODIUM-DEXTROSE 1-4 GM/50ML-% IV SOLN
1.0000 g | Freq: Four times a day (QID) | INTRAVENOUS | Status: AC
  Administered 2024-07-11 – 2024-07-12 (×3): 1 g via INTRAVENOUS
  Filled 2024-07-11 (×3): qty 50

## 2024-07-11 MED ORDER — CEFAZOLIN SODIUM-DEXTROSE 2-4 GM/100ML-% IV SOLN
2.0000 g | INTRAVENOUS | Status: DC
  Filled 2024-07-11: qty 100

## 2024-07-11 MED ORDER — FENTANYL CITRATE (PF) 100 MCG/2ML IJ SOLN
INTRAMUSCULAR | Status: AC
  Filled 2024-07-11: qty 2

## 2024-07-11 MED ORDER — SODIUM CHLORIDE 0.9 % IV SOLN
80.0000 mg | INTRAVENOUS | Status: DC
  Filled 2024-07-11: qty 2

## 2024-07-11 MED ORDER — MIDAZOLAM HCL 2 MG/2ML IJ SOLN
INTRAMUSCULAR | Status: AC
  Filled 2024-07-11: qty 2

## 2024-07-11 MED ORDER — CEFAZOLIN SODIUM-DEXTROSE 2-4 GM/100ML-% IV SOLN
INTRAVENOUS | Status: AC
  Filled 2024-07-11: qty 100

## 2024-07-11 MED ORDER — FENTANYL CITRATE (PF) 100 MCG/2ML IJ SOLN
INTRAMUSCULAR | Status: DC | PRN
  Administered 2024-07-11: 50 ug via INTRAVENOUS

## 2024-07-11 MED ORDER — CEFAZOLIN SODIUM-DEXTROSE 2-3 GM-%(50ML) IV SOLR
INTRAVENOUS | Status: DC | PRN
  Administered 2024-07-11: 2 g via INTRAVENOUS

## 2024-07-11 MED ORDER — SODIUM CHLORIDE 0.9 % IV SOLN: INTRAVENOUS | Status: DC

## 2024-07-11 MED ORDER — FUROSEMIDE 20 MG PO TABS
20.0000 mg | ORAL_TABLET | Freq: Every day | ORAL | Status: DC
  Administered 2024-07-11 – 2024-07-13 (×2): 20 mg via ORAL
  Filled 2024-07-11 (×3): qty 1

## 2024-07-11 MED ORDER — SODIUM CHLORIDE 0.9 % IV SOLN
INTRAVENOUS | Status: DC | PRN
  Administered 2024-07-11: 80 mg

## 2024-07-11 MED ORDER — MIDAZOLAM HCL 5 MG/5ML IJ SOLN
INTRAMUSCULAR | Status: DC | PRN
  Administered 2024-07-11: 1 mg via INTRAVENOUS

## 2024-07-11 MED ORDER — LIDOCAINE HCL (PF) 1 % IJ SOLN
INTRAMUSCULAR | Status: DC | PRN
  Administered 2024-07-11: 60 mL

## 2024-07-11 MED ORDER — SODIUM CHLORIDE 0.9 % IV SOLN
INTRAVENOUS | Status: AC
  Filled 2024-07-11: qty 2

## 2024-07-11 MED ORDER — SODIUM CHLORIDE 0.9% FLUSH: 3.0000 mL | INTRAVENOUS | Status: DC | PRN

## 2024-07-11 MED ORDER — HEPARIN (PORCINE) IN NACL 1000-0.9 UT/500ML-% IV SOLN
INTRAVENOUS | Status: DC | PRN
  Administered 2024-07-11: 500 mL

## 2024-07-11 SURGICAL SUPPLY — 12 items
CABLE SURGICAL S-101-97-12 (CABLE) ×1 IMPLANT
CATH CPS LOCATOR 3D MED (CATHETERS) IMPLANT
LEAD ULTIPACE 52 LPA1231/52 (Lead) IMPLANT
LEAD ULTIPACE 65 LPA1231/65 (Lead) IMPLANT
PACEMAKER ASSURITY DR-RF (Pacemaker) IMPLANT
PAD DEFIB RADIO PHYSIO CONN (PAD) ×1 IMPLANT
SHEATH 7FR PRELUDE SNAP 13 (SHEATH) IMPLANT
SHEATH 9FR PRELUDE SNAP 13 (SHEATH) IMPLANT
SLITTER AGILIS HISPRO (INSTRUMENTS) IMPLANT
TOOL HELIX LOCKING (MISCELLANEOUS) IMPLANT
TRAY PACEMAKER INSERTION (PACKS) ×1 IMPLANT
WIRE HI TORQ VERSACORE-J 145CM (WIRE) IMPLANT

## 2024-07-11 NOTE — Plan of Care (Signed)
  Problem: Education: Goal: Knowledge of General Education information will improve Description: Including pain rating scale, medication(s)/side effects and non-pharmacologic comfort measures Outcome: Progressing   Problem: Health Behavior/Discharge Planning: Goal: Ability to manage health-related needs will improve Outcome: Progressing   Problem: Clinical Measurements: Goal: Ability to maintain clinical measurements within normal limits will improve Outcome: Progressing   Problem: Clinical Measurements: Goal: Will remain free from infection Outcome: Progressing   Problem: Clinical Measurements: Goal: Diagnostic test results will improve Outcome: Progressing   Problem: Clinical Measurements: Goal: Cardiovascular complication will be avoided Outcome: Progressing   Problem: Activity: Goal: Risk for activity intolerance will decrease Outcome: Progressing   Problem: Nutrition: Goal: Adequate nutrition will be maintained Outcome: Progressing   Problem: Coping: Goal: Level of anxiety will decrease Outcome: Progressing   Problem: Elimination: Goal: Will not experience complications related to bowel motility Outcome: Progressing   Problem: Elimination: Goal: Will not experience complications related to urinary retention Outcome: Progressing   Problem: Pain Managment: Goal: General experience of comfort will improve and/or be controlled Outcome: Progressing   Problem: Safety: Goal: Ability to remain free from injury will improve Outcome: Progressing   Problem: Safety: Goal: Ability to remain free from injury will improve Outcome: Progressing   Problem: Safety: Goal: Ability to remain free from injury will improve Outcome: Progressing   Problem: Skin Integrity: Goal: Risk for impaired skin integrity will decrease Outcome: Progressing

## 2024-07-11 NOTE — H&P (View-Only) (Signed)
 Rounding Note   Patient Name: Ryan [name] Date of Encounter: 07/11/2024  [facility] Cardiologist: [name], MD   Subjective  Continues to feel well, has ambulate this morning already No CP, SOB  Scheduled Meds:  aspirin  EC  81 mg Oral QPM   atorvastatin   40 mg Oral Daily   busPIRone  5 mg Oral Daily   Chlorhexidine  Gluconate Cloth  6 each Topical Daily   citalopram   40 mg Oral QPM   famotidine   20 mg Oral QHS   fluticasone  furoate-vilanterol  1 puff Inhalation Daily   gentamicin (GARAMYCIN) 80 mg in sodium chloride  0.9 % 500 mL irrigation  80 mg Irrigation On Call   levothyroxine   75 mcg Oral QHS   losartan  25 mg Oral Daily   pantoprazole   40 mg Oral Daily   pramipexole  0.5 mg Oral Daily   sodium chloride  flush  3 mL Intravenous Q12H   sodium chloride  flush  3 mL Intravenous Q12H   Continuous Infusions:  sodium chloride      sodium chloride       ceFAZolin (ANCEF) IV     PRN Meds: sodium chloride , acetaminophen  **OR** acetaminophen , ALPRAZolam , melatonin, morphine  injection, ondansetron  (ZOFRAN ) IV, oxyCODONE , sodium chloride  flush, sodium chloride  flush, traMADol    Vital Signs  Vitals:   07/11/24 0330 07/11/24 0424 07/11/24 0500 07/11/24 0600  BP:  (!) 141/61 (!) 145/65 (!) 159/65  Pulse:  80 79 82  Resp:  (!) 23 20 19   Temp: 98.3 F (36.8 C)     TempSrc: Oral     SpO2:  95% 95% 95%  Weight:   89 kg   Height:        Intake/Output Summary (Last 24 hours) at 07/11/2024 0750 Last data filed at 07/11/2024 0600 Gross per 24 hour  Intake 6 ml  Output 1150 ml  Net -1144 ml      07/11/2024    5:00 AM 07/10/2024    5:00 AM 07/09/2024    5:00 AM  Last 3 Weights  Weight (lbs) 196 lb 1.6 oz 187 lb 9.8 oz 197 lb 1.5 oz  Weight (kg) 88.95 kg 85.1 kg 89.4 kg      Telemetry  Mostly conducting though with brief periods of back up pacing needs.  Underlying is difficult to tell, irregular, ? AFib though at baseline he has a long PR and low  amplitude P wave At times is clearly sinus, though can not r/o AFib w/CVR  - Personally Reviewed  ECG   10/2 is SR 78bpm, RBBB, LAD  - Personally Reviewed  Physical Exam  GEN: No acute distress.   Neck: No JVD Temp wire site/stable Cardiac: RRR, no murmurs, rubs, or gallops.  Respiratory: Clear to auscultation bilaterally. GI: Soft, nontender, non-distended  MS: No edema; No deformity. Neuro:  Nonfocal  Psych: Normal affect   Labs High Sensitivity Troponin:  No results for input(s): TROPONINIHS in the last 720 hours.   Chemistry Recent Labs  Lab 07/04/24 0906 07/08/24 0754 07/09/24 0500 07/10/24 0223 07/11/24 0343  NA 138   < > 136 138 137  K 3.4*   < > 4.1 3.8 4.7  CL 99   < > 103 106 107  CO2 27   < > 23 24 26   GLUCOSE 80   < > 112* 113* 121*  BUN 29*   < > 15 19 20   CREATININE 2.21*   < > 1.34* 1.65* 1.31*  CALCIUM  9.0   < >  8.4* 8.2* 8.4*  MG  --   --  1.8  --  2.0  PROT 6.8  --   --   --   --   ALBUMIN  4.0  --   --   --   --   AST 41  --   --   --   --   ALT 34  --   --   --   --   ALKPHOS 56  --   --   --   --   BILITOT 1.2  --   --   --   --   GFRNONAA 30*   < > 55* 43* 57*  ANIONGAP 12   < > 10 8 4*   < > = values in this interval not displayed.    Lipids No results for input(s): CHOL, TRIG, HDL, LABVLDL, LDLCALC, CHOLHDL in the last 168 hours.  Hematology Recent Labs  Lab 07/09/24 0500 07/10/24 0223 07/11/24 0343  WBC 9.8 9.3 9.7  RBC 4.10* 3.96* 3.88*  HGB 12.2* 11.5* 11.4*  HCT 37.0* 36.0* 35.1*  MCV 90.2 90.9 90.5  MCH 29.8 29.0 29.4  MCHC 33.0 31.9 32.5  RDW 14.6 14.5 14.5  PLT 176 154 130*   Thyroid  No results for input(s): TSH, FREET4 in the last 168 hours.  BNPNo results for input(s): BNP, PROBNP in the last 168 hours.  DDimer No results for input(s): DDIMER in the last 168 hours.   Radiology    Cardiac Studies  07/09/24: TTE 1. Left ventricular ejection fraction, by estimation, is 60 to 65%. The  left  ventricle has normal function. The left ventricle has no regional  wall motion abnormalities. There is mild left ventricular hypertrophy.  Left ventricular diastolic parameters  are indeterminate.   2. Right ventricular systolic function is mildly reduced. The right  ventricular size is moderately enlarged.   3. The mitral valve is abnormal. Trivial mitral valve regurgitation.  Moderate mitral stenosis. The mean mitral valve gradient is 8.0 mmHg with  average heart rate of 68 bpm. Moderate to severe mitral annular  calcification.   4. Trivial posterior perivalvular leak. The aortic valve has been  repaired/replaced. Aortic valve regurgitation is trivial. No aortic  stenosis is present. There is a 29 mm Edwards Sapien prosthetic (TAVR)  valve present in the aortic position. Procedure  Date: 07/09/24. Aortic valve mean gradient measures 4.0 mmHg. Aortic valve  Vmax measures 1.37 m/s. Aortic valve acceleration time measures 99 msec.    05/20/24: LHC   Mid LM to Dist LM lesion is 90% stenosed.   Ost LAD lesion is 80% stenosed.   1st Mrg lesion is 50% stenosed.   Prox RCA to Mid RCA lesion is 90% stenosed.   1.  Patent LIMA to LAD, left radial to ramus, and vein graft to RCA grafts. 2.  Severe native vessel disease. 3.  Fick cardiac output of 4.8 L/min and Fick cardiac index of 2.2 L/min/m with the following hemodynamics:            Right atrial pressure mean of 11 mmHg            Right ventricular pressure 65/22 with end-diastolic pressure of 10 mmHg            PA pressure 53/36 with a mean of 36 mmHg            Wedge pressure mean of 20 mmHg with V waves to 22 mmHg  PVR of 3.35            PA pulsatility index of 1.5 4.  Capacious iliofemoral vessels bilaterally   Recommendation: Continue evaluation for aortic valve intervention.  Increase Lasix  to 20 mg twice daily and check BMP in 1 week given elevated RA pressure and decreased PA pulsatility index.    Patient Profile    73 y.o. male w/PMHx of Hodgkin's lymphoma (s/p XRT), Breast ca (remote) s/p right mastectomy/chemo HTN, HLD, CKD (IIIa) PVD (carotid dz, distal R ICA occl) CAD (CABG 2014) VHD (severe AS and severe MAC)  Admitted for TAVR 07/08/24, developed transient CHB post valve deployment w/baseline trifascicular block  Assessment & Plan   Conduction system disease Baseline RBBB, 1st degree AVBlock w/LAD Transient advanced heart block with TAVR requiring pacing support  Though did regain 1:1 conduction. has developed increasing pacing requirements via his temp wire   PPM planned for today Hx of R sided mastectomy and chest XRT  Follow up questions answered this morning, he remains agreeable to proceed Clears this Am then NPO   For questions or updates, please contact [facility] Please consult [email or web address] for contact info under     Signed, Charlies Macario Arthur, PA-C  07/11/2024, 7:50 AM    I have seen, examined the patient, and reviewed the above assessment and plan.    Interval:  No acute overnight events. Patient reports feeling relatively well. No new or acute complaints. Still intermittently pacing.    General: Well developed, in no acute distress.  Neck: No JVD. RIJ temp wire. Cardiac: Normal rate, regular rhythm.  Resp: Normal work of breathing.  Ext: No edema.  Neuro: No gross focal deficits.  Psych: Normal affect.   Echo 07/09/24:   1. Left ventricular ejection fraction, by estimation, is 60 to 65%. The  left ventricle has normal function. The left ventricle has no regional  wall motion abnormalities. There is mild left ventricular hypertrophy.  Left ventricular diastolic parameters  are indeterminate.   2. Right ventricular systolic function is mildly reduced. The right  ventricular size is moderately enlarged.   3. The mitral valve is abnormal. Trivial mitral valve regurgitation.  Moderate mitral stenosis. The mean mitral valve gradient is 8.0 mmHg with   average heart rate of 68 bpm. Moderate to severe mitral annular  calcification.   4. Trivial posterior perivalvular leak. The aortic valve has been  repaired/replaced. Aortic valve regurgitation is trivial. No aortic  stenosis is present. There is a 29 mm Edwards Sapien prosthetic (TAVR)  valve present in the aortic position. Procedure  Date: 07/09/24. Aortic valve mean gradient measures 4.0 mmHg. Aortic valve  Vmax measures 1.37 m/s. Aortic valve acceleration time measures 99 msec.   Assessment and Plan:  Mr. Brar is a 73 year old male with baseline conduction disease who underwent TAVR which was complicated by complete heart block in the immediate postoperative period.    #Baseline trifascicular block #New intermittent CHB s/p TAVR:  - Given his baseline extensive conduction disease and new intermittent CHB, still with ventricular pacing at times, patient will need permanent pacemaker prior to discharge. Explained risks, benefits, and alternatives to pacemaker implantation, including but not limited to bleeding, infection, damage to heart or lungs, heart attack, stroke, or death.  Pt verbalized understanding and agrees to proceed today.  [name], MD 07/11/2024 9:36 AM

## 2024-07-11 NOTE — Interval H&P Note (Signed)
 History and Physical Interval Note:  07/11/2024 1:06 PM  Ryan [name]  has presented today for surgery, with the diagnosis of complete heart block.  The various methods of treatment have been discussed with the patient and family. After consideration of risks, benefits and other options for treatment, the patient has consented to  Procedure(s): PACEMAKER IMPLANT ([name]) as a surgical intervention.  The patient's history has been reviewed, patient examined, no change in status, stable for surgery.  I have reviewed the patient's chart and labs.  Questions were answered to the patient's satisfaction.     [name]

## 2024-07-11 NOTE — Progress Notes (Addendum)
 Rounding Note   Patient Name: Ryan [name] Date of Encounter: 07/11/2024  [facility] Cardiologist: [name], MD   Subjective  Continues to feel well, has ambulate this morning already No CP, SOB  Scheduled Meds:  aspirin  EC  81 mg Oral QPM   atorvastatin   40 mg Oral Daily   busPIRone  5 mg Oral Daily   Chlorhexidine  Gluconate Cloth  6 each Topical Daily   citalopram   40 mg Oral QPM   famotidine   20 mg Oral QHS   fluticasone  furoate-vilanterol  1 puff Inhalation Daily   gentamicin (GARAMYCIN) 80 mg in sodium chloride  0.9 % 500 mL irrigation  80 mg Irrigation On Call   levothyroxine   75 mcg Oral QHS   losartan  25 mg Oral Daily   pantoprazole   40 mg Oral Daily   pramipexole  0.5 mg Oral Daily   sodium chloride  flush  3 mL Intravenous Q12H   sodium chloride  flush  3 mL Intravenous Q12H   Continuous Infusions:  sodium chloride      sodium chloride       ceFAZolin (ANCEF) IV     PRN Meds: sodium chloride , acetaminophen  **OR** acetaminophen , ALPRAZolam , melatonin, morphine  injection, ondansetron  (ZOFRAN ) IV, oxyCODONE , sodium chloride  flush, sodium chloride  flush, traMADol    Vital Signs  Vitals:   07/11/24 0330 07/11/24 0424 07/11/24 0500 07/11/24 0600  BP:  (!) 141/61 (!) 145/65 (!) 159/65  Pulse:  80 79 82  Resp:  (!) 23 20 19   Temp: 98.3 F (36.8 C)     TempSrc: Oral     SpO2:  95% 95% 95%  Weight:   89 kg   Height:        Intake/Output Summary (Last 24 hours) at 07/11/2024 0750 Last data filed at 07/11/2024 0600 Gross per 24 hour  Intake 6 ml  Output 1150 ml  Net -1144 ml      07/11/2024    5:00 AM 07/10/2024    5:00 AM 07/09/2024    5:00 AM  Last 3 Weights  Weight (lbs) 196 lb 1.6 oz 187 lb 9.8 oz 197 lb 1.5 oz  Weight (kg) 88.95 kg 85.1 kg 89.4 kg      Telemetry  Mostly conducting though with brief periods of back up pacing needs.  Underlying is difficult to tell, irregular, ? AFib though at baseline he has a long PR and low  amplitude P wave At times is clearly sinus, though can not r/o AFib w/CVR  - Personally Reviewed  ECG   10/2 is SR 78bpm, RBBB, LAD  - Personally Reviewed  Physical Exam  GEN: No acute distress.   Neck: No JVD Temp wire site/stable Cardiac: RRR, no murmurs, rubs, or gallops.  Respiratory: Clear to auscultation bilaterally. GI: Soft, nontender, non-distended  MS: No edema; No deformity. Neuro:  Nonfocal  Psych: Normal affect   Labs High Sensitivity Troponin:  No results for input(s): TROPONINIHS in the last 720 hours.   Chemistry Recent Labs  Lab 07/04/24 0906 07/08/24 0754 07/09/24 0500 07/10/24 0223 07/11/24 0343  NA 138   < > 136 138 137  K 3.4*   < > 4.1 3.8 4.7  CL 99   < > 103 106 107  CO2 27   < > 23 24 26   GLUCOSE 80   < > 112* 113* 121*  BUN 29*   < > 15 19 20   CREATININE 2.21*   < > 1.34* 1.65* 1.31*  CALCIUM  9.0   < >  8.4* 8.2* 8.4*  MG  --   --  1.8  --  2.0  PROT 6.8  --   --   --   --   ALBUMIN  4.0  --   --   --   --   AST 41  --   --   --   --   ALT 34  --   --   --   --   ALKPHOS 56  --   --   --   --   BILITOT 1.2  --   --   --   --   GFRNONAA 30*   < > 55* 43* 57*  ANIONGAP 12   < > 10 8 4*   < > = values in this interval not displayed.    Lipids No results for input(s): CHOL, TRIG, HDL, LABVLDL, LDLCALC, CHOLHDL in the last 168 hours.  Hematology Recent Labs  Lab 07/09/24 0500 07/10/24 0223 07/11/24 0343  WBC 9.8 9.3 9.7  RBC 4.10* 3.96* 3.88*  HGB 12.2* 11.5* 11.4*  HCT 37.0* 36.0* 35.1*  MCV 90.2 90.9 90.5  MCH 29.8 29.0 29.4  MCHC 33.0 31.9 32.5  RDW 14.6 14.5 14.5  PLT 176 154 130*   Thyroid  No results for input(s): TSH, FREET4 in the last 168 hours.  BNPNo results for input(s): BNP, PROBNP in the last 168 hours.  DDimer No results for input(s): DDIMER in the last 168 hours.   Radiology    Cardiac Studies  07/09/24: TTE 1. Left ventricular ejection fraction, by estimation, is 60 to 65%. The  left  ventricle has normal function. The left ventricle has no regional  wall motion abnormalities. There is mild left ventricular hypertrophy.  Left ventricular diastolic parameters  are indeterminate.   2. Right ventricular systolic function is mildly reduced. The right  ventricular size is moderately enlarged.   3. The mitral valve is abnormal. Trivial mitral valve regurgitation.  Moderate mitral stenosis. The mean mitral valve gradient is 8.0 mmHg with  average heart rate of 68 bpm. Moderate to severe mitral annular  calcification.   4. Trivial posterior perivalvular leak. The aortic valve has been  repaired/replaced. Aortic valve regurgitation is trivial. No aortic  stenosis is present. There is a 29 mm Edwards Sapien prosthetic (TAVR)  valve present in the aortic position. Procedure  Date: 07/09/24. Aortic valve mean gradient measures 4.0 mmHg. Aortic valve  Vmax measures 1.37 m/s. Aortic valve acceleration time measures 99 msec.    05/20/24: LHC   Mid LM to Dist LM lesion is 90% stenosed.   Ost LAD lesion is 80% stenosed.   1st Mrg lesion is 50% stenosed.   Prox RCA to Mid RCA lesion is 90% stenosed.   1.  Patent LIMA to LAD, left radial to ramus, and vein graft to RCA grafts. 2.  Severe native vessel disease. 3.  Fick cardiac output of 4.8 L/min and Fick cardiac index of 2.2 L/min/m with the following hemodynamics:            Right atrial pressure mean of 11 mmHg            Right ventricular pressure 65/22 with end-diastolic pressure of 10 mmHg            PA pressure 53/36 with a mean of 36 mmHg            Wedge pressure mean of 20 mmHg with V waves to 22 mmHg  PVR of 3.35            PA pulsatility index of 1.5 4.  Capacious iliofemoral vessels bilaterally   Recommendation: Continue evaluation for aortic valve intervention.  Increase Lasix  to 20 mg twice daily and check BMP in 1 week given elevated RA pressure and decreased PA pulsatility index.    Patient Profile    73 y.o. male w/PMHx of Hodgkin's lymphoma (s/p XRT), Breast ca (remote) s/p right mastectomy/chemo HTN, HLD, CKD (IIIa) PVD (carotid dz, distal R ICA occl) CAD (CABG 2014) VHD (severe AS and severe MAC)  Admitted for TAVR 07/08/24, developed transient CHB post valve deployment w/baseline trifascicular block  Assessment & Plan   Conduction system disease Baseline RBBB, 1st degree AVBlock w/LAD Transient advanced heart block with TAVR requiring pacing support  Though did regain 1:1 conduction. has developed increasing pacing requirements via his temp wire   PPM planned for today Hx of R sided mastectomy and chest XRT  Follow up questions answered this morning, he remains agreeable to proceed Clears this Am then NPO   For questions or updates, please contact [facility] Please consult [email or web address] for contact info under     Signed, Charlies Macario Arthur, PA-C  07/11/2024, 7:50 AM    I have seen, examined the patient, and reviewed the above assessment and plan.    Interval:  No acute overnight events. Patient reports feeling relatively well. No new or acute complaints. Still intermittently pacing.    General: Well developed, in no acute distress.  Neck: No JVD. RIJ temp wire. Cardiac: Normal rate, regular rhythm.  Resp: Normal work of breathing.  Ext: No edema.  Neuro: No gross focal deficits.  Psych: Normal affect.   Echo 07/09/24:   1. Left ventricular ejection fraction, by estimation, is 60 to 65%. The  left ventricle has normal function. The left ventricle has no regional  wall motion abnormalities. There is mild left ventricular hypertrophy.  Left ventricular diastolic parameters  are indeterminate.   2. Right ventricular systolic function is mildly reduced. The right  ventricular size is moderately enlarged.   3. The mitral valve is abnormal. Trivial mitral valve regurgitation.  Moderate mitral stenosis. The mean mitral valve gradient is 8.0 mmHg with   average heart rate of 68 bpm. Moderate to severe mitral annular  calcification.   4. Trivial posterior perivalvular leak. The aortic valve has been  repaired/replaced. Aortic valve regurgitation is trivial. No aortic  stenosis is present. There is a 29 mm Edwards Sapien prosthetic (TAVR)  valve present in the aortic position. Procedure  Date: 07/09/24. Aortic valve mean gradient measures 4.0 mmHg. Aortic valve  Vmax measures 1.37 m/s. Aortic valve acceleration time measures 99 msec.   Assessment and Plan:  Mr. Brar is a 73 year old male with baseline conduction disease who underwent TAVR which was complicated by complete heart block in the immediate postoperative period.    #Baseline trifascicular block #New intermittent CHB s/p TAVR:  - Given his baseline extensive conduction disease and new intermittent CHB, still with ventricular pacing at times, patient will need permanent pacemaker prior to discharge. Explained risks, benefits, and alternatives to pacemaker implantation, including but not limited to bleeding, infection, damage to heart or lungs, heart attack, stroke, or death.  Pt verbalized understanding and agrees to proceed today.  [name], MD 07/11/2024 9:36 AM

## 2024-07-11 NOTE — Progress Notes (Signed)
 Pt educated on HH, diet, ex guidelines, and CR at [facility]. Pt reported he walked with nurse this morning.   Garen FORBES Candy MS, ACSM-CEP 07/11/2024 9:50 AM

## 2024-07-11 NOTE — Progress Notes (Addendum)
 [facility]   MULTIDISCIPLINARY HEART VALVE TEAM  Patient Name: Ryan [name] Date of Encounter: 07/11/2024  Admit date: 07/08/2024   PCP:  Shona Norleen PEDLAR, MD  [facility] Cardiologist:  [name], MD  [facility] Structural heart: [name], MD [facility] Electrophysiologist:  [name], MD   Hospital Problem List     Principal Problem:   S/P TAVR (transcatheter aortic valve replacement) Active Problems:   Invasive ductal carcinoma of right breast, stage 2- 2012 s/p chemo and mastectomy   Hodgkin's disease-1983   Hyperlipidemia with target LDL less than 70   Orthostatic hypotension   Coronary atherosclerosis of native coronary artery - multivessel CAD, status post CABG x3 (LIMA-LAD, Left Radial-RI, SVG-RCA)   Carotid artery stenosis   Essential hypertension   Severe aortic stenosis   Trifascicular block     Subjective   Laying in bed. No complaints except for some back pain. Slept better last night.   Inpatient Medications    Scheduled Meds:  aspirin  EC  81 mg Oral QPM   atorvastatin   40 mg Oral Daily   busPIRone  5 mg Oral Daily   Chlorhexidine  Gluconate Cloth  6 each Topical Daily   citalopram   40 mg Oral QPM   famotidine   20 mg Oral QHS   fluticasone  furoate-vilanterol  1 puff Inhalation Daily   furosemide   20 mg Oral Daily   gentamicin (GARAMYCIN) 80 mg in sodium chloride  0.9 % 500 mL irrigation  80 mg Irrigation On Call   levothyroxine   75 mcg Oral QHS   losartan  25 mg Oral Daily   pantoprazole   40 mg Oral Daily   pramipexole  0.5 mg Oral Daily   sodium chloride  flush  3 mL Intravenous Q12H   sodium chloride  flush  3 mL Intravenous Q12H   Continuous Infusions:  sodium chloride      sodium chloride       ceFAZolin (ANCEF) IV     PRN Meds: sodium chloride , acetaminophen  **OR** acetaminophen , ALPRAZolam , melatonin, morphine  injection, ondansetron  (ZOFRAN ) IV, oxyCODONE , sodium chloride  flush, sodium chloride   flush, traMADol    Vital Signs    Vitals:   07/11/24 0424 07/11/24 0500 07/11/24 0600 07/11/24 0800  BP: (!) 141/61 (!) 145/65 (!) 159/65 (!) 142/81  Pulse: 80 79 82 83  Resp: (!) 23 20 19 19   Temp:      TempSrc:      SpO2: 95% 95% 95% 95%  Weight:  89 kg    Height:        Intake/Output Summary (Last 24 hours) at 07/11/2024 0948 Last data filed at 07/11/2024 0600 Gross per 24 hour  Intake 6 ml  Output 1150 ml  Net -1144 ml   Filed Weights   07/09/24 0500 07/10/24 0500 07/11/24 0500  Weight: 89.4 kg 85.1 kg 89 kg    Physical Exam    GEN: Well nourished, well developed, in no acute distress.  HEENT: Grossly normal.  Neck: Supple, no JVD or masses. Rij in place Cardiac: irreg irreg, no murmurs, rubs, or gallops. No clubbing, cyanosis, edema.   Respiratory:  Respirations regular and unlabored, clear to auscultation bilaterally. GI: Soft, nontender, nondistended, BS + x 4. MS: no deformity or atrophy. Skin: warm and dry, no rash.  Groin sites clear without hematoma or ecchymosis Neuro:  Strength and sensation are intact. Psych: AAOx3.  Normal affect.  Labs    CBC Recent Labs    07/10/24 0223 07/11/24 0343  WBC  9.3 9.7  HGB 11.5* 11.4*  HCT 36.0* 35.1*  MCV 90.9 90.5  PLT 154 130*   Basic Metabolic Panel: Recent Labs  Lab 07/08/24 0754 07/09/24 0500 07/10/24 0223 07/11/24 0343  NA 142 136 138 137  K 3.9 4.1 3.8 4.7  CL 103 103 106 107  CO2 25 23 24 26   GLUCOSE 108* 112* 113* 121*  BUN 14 15 19 20   CREATININE 1.46* 1.34* 1.65* 1.31*  CALCIUM  8.7* 8.4* 8.2* 8.4*  MG  --  1.8  --  2.0   GFR: Estimated Creatinine Clearance: 58.2 mL/min (A) (by C-G formula based on SCr of 1.31 mg/dL (H)). Recent Labs  Lab 07/09/24 0500 07/10/24 0223 07/11/24 0343  WBC 9.8 9.3 9.7    Telemetry   Mostly conducting though with brief periods of back up pacing needs, periods of afib. - Personally Reviewed  ECG    No new tracing - Personally Reviewed  Patient  Profile     Ryan [name] is a 73 y.o. male with a history of CAD s/p CABG x 3 LIMA-LAD, free LRA to RI, SVG-RCA (2014), DMT2 without insulin , stage 2 infiltrating ductal carcinoma of breast s/p Tamoxifen  and R mastectomy 2012, Hodgkin lymphoma s/p mantle XRT 1983, CKD stage IIIb, HFrEF, HLD, asthma, bilateral carotid artery stenosis, RBBB with LAFB, asthma, phrenic nerve palsy, severe MAC with mitral valve stenosis, and severe AS with AI who presented to [facility] on 07/08/24 for planned TAVR.   Assessment & Plan    Severe AS:  -- S/p successful TAVR with a 29 mm Edwards Sapien 3 Ultra Resilia THV via the TF approach on 07/08/24.  -- Post operative echo showed EF 60%, mild LVH, mod RVE, mod-severe MAC with mod MS and normally functioning TAVR with a mean gradient of 4 mmHg and trivial PVL. -- Groin sites are stable. -- Currently in atrial fibrillation--> will need Eliquis new start at discharge.  -- Plan for PPM, hopefully later today.  -- Hopeful discharge over the weekend.    RBBB with, 1st deg AV block and LAFB: -- Transient advanced heart block with TAVR requiring pacing support with continued pacer support. -- PPM later today. NPO   Acute on chronic HFimpEF: -- EF 40%--> 60% on POD 1 echo. -- LVEDP 20 mm hg at the time of TAVR.  -- Treated with one dose of IV lasix  20mg  x 1. -- Will resume home Lasix  at 20 mg daily (reduced from 20mg  BID).   AKI in the setting of CKD stage IIIb:  -- Creat 2.21 on PAT labs and lasix  held.  -- Creat improved to 1.31. -- Restarted Lasix  at 20mg  daily.   PAF: -- New onset afib noted on tele.  -- CHADSVASC of at least 5. -- Will need to be started on Eliquis at discharge to start when cleared from a pacemaker standpoint.   HTN:  -- BP well controlled.  -- Started on Losartan 25mg  for renal protection with CKD.  -- Restarted Lasix  at 20mg  daily.    CAD s/p CABG: -- [facility] 05/20/24 showed severe native CAD with patent bypass grafts.  -- Continue  medical therapy.  -- Continue simvastatin  80mg  daily.    Mitral valve disease: -- Severe MAC with mitral valve stenosis.  Bonney Lamarr Hummer, PA-C  07/11/2024, 9:48 AM  Pager [phone]  ATTENDING ATTESTATION:  After conducting a review of all available clinical information with the care team, interviewing the patient, and performing a physical exam, I agree with  the findings and plan described in this note.   GEN: No acute distress, AO x 3 HEENT:  MMM, no JVD, no scleral icterus, RIJ temp wire in place Cardiac: Irregular RRR, no murmurs, rubs, or gallops.  Respiratory: Clear to auscultation bilaterally. GI: Soft, nontender, non-distended  MS: No edema; No deformity. Neuro:  Nonfocal  Vasc:  +2 radial pulses  Patient has now developed new atrial fibrillation.  In conjunction with significant conduction disease, patient will have PPM implanted today.  Appreciate EP recs.  Continue current therapy with planned initiation of anticoagulation per EP.  Lurena Red, MD Pager [phone]

## 2024-07-12 ENCOUNTER — Inpatient Hospital Stay (HOSPITAL_COMMUNITY)

## 2024-07-12 DIAGNOSIS — Z952 Presence of prosthetic heart valve: Secondary | ICD-10-CM | POA: Diagnosis not present

## 2024-07-12 LAB — BASIC METABOLIC PANEL WITH GFR
Anion gap: 8 (ref 5–15)
BUN: 24 mg/dL — ABNORMAL HIGH (ref 8–23)
CO2: 25 mmol/L (ref 22–32)
Calcium: 8.1 mg/dL — ABNORMAL LOW (ref 8.9–10.3)
Chloride: 103 mmol/L (ref 98–111)
Creatinine, Ser: 1.69 mg/dL — ABNORMAL HIGH (ref 0.61–1.24)
GFR, Estimated: 42 mL/min — ABNORMAL LOW (ref >60)
Glucose, Bld: 132 mg/dL — ABNORMAL HIGH (ref 70–99)
Potassium: 4.2 mmol/L (ref 3.5–5.1)
Sodium: 136 mmol/L (ref 135–145)

## 2024-07-12 LAB — CBC
HCT: 35.8 % — ABNORMAL LOW (ref 39.0–52.0)
Hemoglobin: 11.6 g/dL — ABNORMAL LOW (ref 13.0–17.0)
MCH: 29.4 pg (ref 26.0–34.0)
MCHC: 32.4 g/dL (ref 30.0–36.0)
MCV: 90.6 fL (ref 80.0–100.0)
Platelets: 145 K/uL — ABNORMAL LOW (ref 150–400)
RBC: 3.95 MIL/uL — ABNORMAL LOW (ref 4.22–5.81)
RDW: 14.3 % (ref 11.5–15.5)
WBC: 11.4 K/uL — ABNORMAL HIGH (ref 4.0–10.5)
nRBC: 0 % (ref 0.0–0.2)

## 2024-07-12 MED ORDER — LACTATED RINGERS IV BOLUS
500.0000 mL | Freq: Once | INTRAVENOUS | Status: AC
  Administered 2024-07-12: 500 mL via INTRAVENOUS

## 2024-07-12 NOTE — Evaluation (Signed)
 Physical Therapy Evaluation Patient Details Name: Ryan [name] MRN: [id number] DOB: 1950/12/19 Today's Date: 07/12/2024  History of Present Illness  PT is 73 yo presenting to [facility] on 9/30 for planned TAVR. PMH: CAD s/p CABG x 3 LIMA-LAD, free lRA to RI, SVG-RCA, DM II, breast cancer s/p R mastectomy, Hodgkin lymphoma, CKD, HFrEF, HLD, asthma, bil carotid artery stenosis, RBBB with LAFB, asthma, phrenic nerve palsy, severe mAC with mitral valve stenosis and severe AS with AI.  Clinical Impression  Pt is currently Min A for bed mobility, Mod A for sit to stand and CGA for gait with RW. Pt mobility improved significantly with gait; continued with CGA for safety due to initial need for assist. Pt has supportive family at home. Due to pt current functional status, home set up and available assistance at home no recommended skilled physical therapy services at this time on discharge from acute care hospital setting. Will continue to follow in acute setting in order to ensure that pt returns home with decreased risk for falls, injury, re-hospitalization and improved activity tolerance.           If plan is discharge home, recommend the following: Assistance with cooking/housework;Assist for transportation;Help with stairs or ramp for entrance     Equipment Recommendations None recommended by PT     Functional Status Assessment Patient has had a recent decline in their functional status and demonstrates the ability to make significant improvements in function in a reasonable and predictable amount of time.     Precautions / Restrictions Precautions Precautions: Fall;ICD/Pacemaker Recall of Precautions/Restrictions: Intact Restrictions Weight Bearing Restrictions Per Provider Order: Yes LUE Weight Bearing Per Provider Order: Non weight bearing      Mobility  Bed Mobility Overal bed mobility: Needs Assistance Bed Mobility: Supine to Sit     Supine to sit: Min assist, Contact guard      General bed mobility comments: CGA to get trunk to midline, Min A to scoot to EOB, CGA for trunk stability    Transfers Overall transfer level: Needs assistance Equipment used: Rolling walker (2 wheels) Transfers: Sit to/from Stand Sit to Stand: Mod assist           General transfer comment: Pt requires verbal cues for safe hand placement to adhere to pacemaker precautions and for body mechanics. Mod A to get to standing with slight posterior lean initially. Pt mobility improved significantly after gait.    Ambulation/Gait Ambulation/Gait assistance: Contact guard assist Gait Distance (Feet): 500 Feet Assistive device: Rolling walker (2 wheels) Gait Pattern/deviations: Step-through pattern, Decreased stride length Gait velocity: decreased Gait velocity interpretation: <1.31 ft/sec, indicative of household ambulator   General Gait Details: INitially pt with slight posterior lean, no LOB, improved significantly with stepping and decreased UE support on RW. CGA for safety     Balance Overall balance assessment: Needs assistance Sitting-balance support: Bilateral upper extremity supported, Feet supported Sitting balance-Leahy Scale: Good     Standing balance support: Bilateral upper extremity supported, Reliant on assistive device for balance, During functional activity Standing balance-Leahy Scale: Fair         Pertinent Vitals/Pain Pain Assessment Pain Assessment: No/denies pain    Home Living Family/patient expects to be discharged to:: Private residence Living Arrangements: Spouse/significant other Available Help at Discharge: Family;Available 24 hours/day Type of Home: House Home Access: Stairs to enter Entrance Stairs-Rails: None Entrance Stairs-Number of Steps: 2 post on the R to hold onto Alternate Level Stairs-Number of Steps: flight Home Layout: Two level;Able  to live on main level with bedroom/bathroom Home Equipment: Rolling Walker (2 wheels);Other  (comment);Grab bars - tub/shower (walking stick)      Prior Function Prior Level of Function : Independent/Modified Independent     Mobility Comments: ind without an AD ADLs Comments: Spouse manages medication and does most of the driving.     Extremity/Trunk Assessment   Upper Extremity Assessment Upper Extremity Assessment: Overall WFL for tasks assessed    Lower Extremity Assessment Lower Extremity Assessment: Generalized weakness    Cervical / Trunk Assessment Cervical / Trunk Assessment: Normal  Communication   Communication Communication: No apparent difficulties    Cognition Arousal: Alert Behavior During Therapy: WFL for tasks assessed/performed   PT - Cognitive impairments: No apparent impairments     Following commands: Intact       Cueing Cueing Techniques: Verbal cues     General Comments General comments (skin integrity, edema, etc.): VSS on room air        Assessment/Plan    PT Assessment Patient needs continued PT services  PT Problem List Decreased strength;Decreased balance;Decreased mobility       PT Treatment Interventions DME instruction;Balance training;Gait training;Stair training;Functional mobility training;Therapeutic activities;Therapeutic exercise;Patient/family education    PT Goals (Current goals can be found in the Care Plan section)  Acute Rehab PT Goals Patient Stated Goal: To return home with family PT Goal Formulation: With patient Time For Goal Achievement: 07/26/24 Potential to Achieve Goals: Good    Frequency Min 1X/week        AM-PAC PT 6 Clicks Mobility  Outcome Measure Help needed turning from your back to your side while in a flat bed without using bedrails?: A Little Help needed moving from lying on your back to sitting on the side of a flat bed without using bedrails?: A Little Help needed moving to and from a bed to a chair (including a wheelchair)?: A Little Help needed standing up from a chair using  your arms (e.g., wheelchair or bedside chair)?: A Little Help needed to walk in hospital room?: A Little Help needed climbing 3-5 steps with a railing? : A Little 6 Click Score: 18    End of Session Equipment Utilized During Treatment: Gait belt Activity Tolerance: Patient tolerated treatment well Patient left: in chair;Other (comment) (in transport chair to move floors with nursing staff) Nurse Communication: Mobility status PT Visit Diagnosis: Unsteadiness on feet (R26.81);Muscle weakness (generalized) (M62.81)    Time: 8770-8747 PT Time Calculation (min) (ACUTE ONLY): 23 min   Charges:   PT Evaluation $PT Eval Low Complexity: 1 Low PT Treatments $Therapeutic Activity: 8-22 mins PT General Charges $$ ACUTE PT VISIT: 1 Visit         Dorothyann Maier, DPT, CLT  Acute Rehabilitation Services Office: [phone] (Secure chat preferred)   Dorothyann VEAR Maier 07/12/2024, 1:08 PM

## 2024-07-12 NOTE — Progress Notes (Addendum)
 Patient states his right knee popped yesterday morning when ambulating. He says he feels like he put weight on it and it may have turned it the wrong way. Sore to the touch but more pain when ambulating. Daughter Ryan [name] is asking if someone can look at this.   Ryan Haskett L Zaidyn Claire, RN

## 2024-07-12 NOTE — Progress Notes (Signed)
   Rounding Note    Patient Name: MARKIE FRITH Date of Encounter: 07/12/2024  [facility] Cardiologist: Vishnu P Mallipeddi, MD   Subjective   Hypotensive earlier this morning but blood pressures have improved to 120 systolic at the time of my evaluation.  Up in chair this morning.  Eating breakfast  Vital Signs    Vitals:   07/12/24 0600 07/12/24 0630 07/12/24 0700 07/12/24 0730  BP:    (!) 133/113  Pulse: 85 83 89 81  Resp: (!) 30 17 (!) 23 (!) 21  Temp:    98.2 F (36.8 C)  TempSrc:    Oral  SpO2: 95% 97% (!) 78% 100%  Weight:      Height:        Intake/Output Summary (Last 24 hours) at 07/12/2024 0807 Last data filed at 07/12/2024 0730 Gross per 24 hour  Intake 300 ml  Output 1700 ml  Net -1400 ml      07/11/2024    5:00 AM 07/10/2024    5:00 AM 07/09/2024    5:00 AM  Last 3 Weights  Weight (lbs) 196 lb 1.6 oz 187 lb 9.8 oz 197 lb 1.5 oz  Weight (kg) 88.95 kg 85.1 kg 89.4 kg      Telemetry    Paced- Personally Reviewed   Physical Exam   GEN: No acute distress.   Cardiac: RRR, no murmurs, rubs, or gallops.  Left prepectoral pocket healing well without hematoma.   Respiratory: Clear to auscultation bilaterally. Psych: Normal affect   Assessment & Plan    #Complete heart block #Symptomatic bradycardia #Permanent pacemaker in situ Device functioning appropriately this morning on chest x-ray and device interrogation.  Site is healing well.  He needs to mobilize this morning and also needs to have his internal jugular sheath removed.  I would to see him ambulate and demonstrate stable blood pressures prior to discharge.  Anticipate keeping him through today with discharge tomorrow.     Ole T. Cindie, MD, [facility], [facility] Cardiac Electrophysiology

## 2024-07-12 NOTE — Progress Notes (Signed)
 Patient brought to 4E from 2H. Telemetry box applied, CCMD notified. Patient oriented to room and staff. Call bell in reach.    07/12/24 1301  Vitals  Temp (!) 97.5 F (36.4 C)  Temp Source Oral  BP (!) 107/45  MAP (mmHg) (!) 63  BP Location Right Arm  BP Method Automatic  Patient Position (if appropriate) Sitting  Pulse Rate 85  Pulse Rate Source Monitor  ECG Heart Rate 88  Resp 20  MEWS COLOR  MEWS Score Color Green  Oxygen Therapy  SpO2 99 %  O2 Device Room Air  MEWS Score  MEWS Temp 0  MEWS Systolic 0  MEWS Pulse 0  MEWS RR 0  MEWS LOC 0  MEWS Score 0

## 2024-07-13 ENCOUNTER — Inpatient Hospital Stay (HOSPITAL_COMMUNITY)

## 2024-07-13 ENCOUNTER — Other Ambulatory Visit (HOSPITAL_COMMUNITY): Payer: Self-pay

## 2024-07-13 DIAGNOSIS — Z952 Presence of prosthetic heart valve: Secondary | ICD-10-CM | POA: Diagnosis not present

## 2024-07-13 LAB — HEMOGLOBIN A1C
Hgb A1c MFr Bld: 6.1 % — ABNORMAL HIGH (ref 4.8–5.6)
Mean Plasma Glucose: 128.37 mg/dL

## 2024-07-13 LAB — CBC
HCT: 35.7 % — ABNORMAL LOW (ref 39.0–52.0)
Hemoglobin: 11.7 g/dL — ABNORMAL LOW (ref 13.0–17.0)
MCH: 29.5 pg (ref 26.0–34.0)
MCHC: 32.8 g/dL (ref 30.0–36.0)
MCV: 89.9 fL (ref 80.0–100.0)
Platelets: 126 K/uL — ABNORMAL LOW (ref 150–400)
RBC: 3.97 MIL/uL — ABNORMAL LOW (ref 4.22–5.81)
RDW: 14.5 % (ref 11.5–15.5)
WBC: 12.5 K/uL — ABNORMAL HIGH (ref 4.0–10.5)
nRBC: 0 % (ref 0.0–0.2)

## 2024-07-13 LAB — URINALYSIS, ROUTINE W REFLEX MICROSCOPIC
Bilirubin Urine: NEGATIVE
Glucose, UA: NEGATIVE mg/dL
Ketones, ur: 5 mg/dL — AB
Leukocytes,Ua: NEGATIVE
Nitrite: NEGATIVE
Protein, ur: 30 mg/dL — AB
Specific Gravity, Urine: 1.019 (ref 1.005–1.030)
pH: 5 (ref 5.0–8.0)

## 2024-07-13 LAB — LACTIC ACID, PLASMA: Lactic Acid, Venous: 1 mmol/L (ref 0.5–1.9)

## 2024-07-13 LAB — BASIC METABOLIC PANEL WITH GFR
Anion gap: 8 (ref 5–15)
BUN: 31 mg/dL — ABNORMAL HIGH (ref 8–23)
CO2: 24 mmol/L (ref 22–32)
Calcium: 8.4 mg/dL — ABNORMAL LOW (ref 8.9–10.3)
Chloride: 100 mmol/L (ref 98–111)
Creatinine, Ser: 1.62 mg/dL — ABNORMAL HIGH (ref 0.61–1.24)
GFR, Estimated: 44 mL/min — ABNORMAL LOW (ref >60)
Glucose, Bld: 164 mg/dL — ABNORMAL HIGH (ref 70–99)
Potassium: 4.1 mmol/L (ref 3.5–5.1)
Sodium: 132 mmol/L — ABNORMAL LOW (ref 135–145)

## 2024-07-13 MED ORDER — APIXABAN 5 MG PO TABS: 5.0000 mg | ORAL_TABLET | Freq: Two times a day (BID) | ORAL | 5 refills | Status: AC

## 2024-07-13 MED ORDER — FUROSEMIDE 20 MG PO TABS: 20.0000 mg | ORAL_TABLET | Freq: Every day | ORAL | 1 refills | Status: DC

## 2024-07-13 MED ORDER — SODIUM CHLORIDE 0.9 % IV SOLN: INTRAVENOUS | Status: AC

## 2024-07-13 MED ORDER — METOPROLOL SUCCINATE ER 25 MG PO TB24: 12.5000 mg | ORAL_TABLET | Freq: Two times a day (BID) | ORAL | 1 refills | Status: DC

## 2024-07-13 MED ORDER — METOPROLOL SUCCINATE ER 25 MG PO TB24
12.5000 mg | ORAL_TABLET | Freq: Two times a day (BID) | ORAL | Status: DC
  Administered 2024-07-13: 12.5 mg via ORAL
  Filled 2024-07-13 (×3): qty 1

## 2024-07-13 MED ORDER — ACETAMINOPHEN 500 MG PO TABS
1000.0000 mg | ORAL_TABLET | Freq: Three times a day (TID) | ORAL | Status: DC
Start: 2024-07-13 — End: 2024-07-15
  Administered 2024-07-13 – 2024-07-15 (×6): 1000 mg via ORAL
  Filled 2024-07-13 (×6): qty 2

## 2024-07-13 MED ORDER — VANCOMYCIN HCL 2000 MG/400ML IV SOLN
2000.0000 mg | Freq: Once | INTRAVENOUS | Status: AC
  Administered 2024-07-13: 2000 mg via INTRAVENOUS
  Filled 2024-07-13: qty 400

## 2024-07-13 MED ORDER — LOSARTAN POTASSIUM 25 MG PO TABS: 25.0000 mg | ORAL_TABLET | Freq: Every day | ORAL | 1 refills | Status: DC

## 2024-07-13 MED ORDER — VANCOMYCIN HCL 1250 MG/250ML IV SOLN
1250.0000 mg | INTRAVENOUS | Status: DC
  Administered 2024-07-14: 1250 mg via INTRAVENOUS
  Filled 2024-07-13 (×2): qty 250

## 2024-07-13 MED ORDER — SODIUM CHLORIDE 0.9 % IV SOLN
2.0000 g | INTRAVENOUS | Status: DC
  Administered 2024-07-13 – 2024-07-14 (×2): 2 g via INTRAVENOUS
  Filled 2024-07-13 (×2): qty 20

## 2024-07-13 MED ORDER — ATORVASTATIN CALCIUM 40 MG PO TABS: 40.0000 mg | ORAL_TABLET | Freq: Every day | ORAL | 1 refills | Status: DC

## 2024-07-13 NOTE — Consult Note (Signed)
 Initial Consultation Note   Patient: Ryan [name] FMW:[id number] DOB: 01-Dec-1950 PCP: Shona Norleen PEDLAR, MD DOA: 07/08/2024 DOS: the patient was seen and examined on 07/13/2024 Primary service: Wendel Lurena POUR, MD  Referring physician: Dr. Cindie Reason for consult: Fever  Assessment and Plan: #1 fever in the setting of recent pacemaker and TAVR Will start patient on vancomycin  and Rocephin .  Will monitor renal functions closely.  Follow-up blood cultures. UA does not appear to have urinary tract infection Chest x-ray shows atelectasis encouraged him to use incentive spirometer hourly.  #2 complete heart block status post pacemaker per cardiology  #3 severe aortic stenosis status post TAVR per cardiology  #4 hypothyroidism on Synthroid   #5 hypertension patient on Cozaar metoprolol  Lasix  blood pressure 106/52 with a heart rate of 85 will hold Lasix  and Cozaar and give him NS 100 cc an hour for 1 L.  Continue metoprolol  hold for SBP less than 110.  #6 confusion likely from infectious source.  Family concerned that he got morphine  and oxycodone  and causing him to have more confusion.  Patient really does not have any complaints of pain.  Will stop oxy and Ultram .  Will order standing dose of Tylenol .  TRH will continue to follow the patient.  HPI: Ryan [name] is a 73 y.o. male with past medical history of hyperlipidemia, asthma, stage IIIa CKD, Hodgkin's lymphoma status post XRT, infiltrating ductal carcinoma of the right breast mastectomy in 2012 and tamoxifen  therapy CAD CABG in 2014 admitted for severe aortic stenosis now status post TAVR and complete heart block now status post pacemaker.  Patient spiked a temp of 102 today.  He denies any nausea vomiting diarrhea cough abdominal pain urinary symptoms.  He is confused and has poor memory.  Family thinks this confusion is not baseline for him.  Blood cultures UA and chest x-ray were done.  UA positive for ketones no evidence of UTI,  chest x-ray with atelectasis, blood cultures pending.  Patient does appear dry and was given 500 cc of normal saline.  Review of Systems: see above Past Medical History:  Diagnosis Date   Adie's pupil    Anemia 07/14/2013   Borderline diabetes    CAD (coronary artery disease), native coronary artery 04/09/2013   Dist LM stenosis 70-80% (at trifurcation into LAD, CX, & 2 Ramus branches, 70% mid RCA);; b) Myoview  08/06/14: Low Risk, no Ischemia/infarction   Depression 1983   after I was dx'd w/CA; now just comes in spells  (12/22/2015)   Essential hypertension    Exertional shortness of breath    Hodgkin's lymphoma (HCC) 1983   Hypercholesterolemia    Hypothyroidism    Invasive ductal carcinoma of breast, stage 2 (HCC) 04/27/2011   chemo, mastectomy   Left carotid artery partial occlusion 04/27/2011   CAROTIC DOPPLER 11/2013: < 40% R ICA stenosis, distal waveforms damped - suggests probably intracranial occlusoin, < 40% LICA, normal vertebrals -- no change   Mild aortic stenosis by prior echocardiogram 02/2018   By echo June 2020: Mild-Mod AS (Mean Gradient 13.5 mmHg)   Mild mitral stenosis by prior echocardiogram 08/06/2014   Most recent echo June 2020: Estimated valve area 2.83 cm (in 2019 was roughly 2 cm).  Mean gradient 6-8 mmHg.  Appears to be stable.   Personal history of chemotherapy    Pre-diabetes    Restless leg syndrome    Right carotid artery occlusion 04/27/2011   S/P CABG x 3 04/09/2013   LIMA-LAD, fLRAD-RI, SVG-RCA; Echo  10/29/'15:  mild Conc LVH, no WMA, Gr 1 DD, Mod AoV Sclerosis w/ Mod AI, ~Mild-Mod MS    S/P TAVR (transcatheter aortic valve replacement) 07/08/2024   s/p TAVR with a 29 mm Edwards Sapien 3 Ultra Resilia THV via the TF approach by Dr. Wendel and Dr. Paige   Sleep apnea    Stroke [facility])    2007   Thyroid  disease    Past Surgical History:  Procedure Laterality Date   BIOPSY  09/08/2020   Procedure: BIOPSY;  Surgeon: Golda Claudis PENNER, MD;   [facility];  Service: Endoscopy;;  colon   BIOPSY  12/09/2021   Procedure: BIOPSY;  Surgeon: Eartha Angelia Sieving, MD;  [facility]: [facility];  Service: Gastroenterology;;   BREAST BIOPSY Right 2012   CARDIAC CATHETERIZATION  2014   CATARACT EXTRACTION W/PHACO Right 06/21/2015   Procedure: CATARACT EXTRACTION PHACO AND INTRAOCULAR LENS PLACEMENT (IOC);  Surgeon: Cherene Mania, MD;  Location: [facility];  Service: Ophthalmology;  Laterality: Right;  CDE: 7.05   CATARACT EXTRACTION W/PHACO Left 07/01/2015   Procedure: CATARACT EXTRACTION PHACO AND INTRAOCULAR LENS PLACEMENT (IOC);  Surgeon: Cherene Mania, MD;  Location: [facility];  Service: Ophthalmology;  Laterality: Left;  CDE: 7.49   COLONOSCOPY [name] 05/06/2015   Procedure: COLONOSCOPY;  Surgeon: Claudis PENNER Golda, MD;  [facility];  Service: Endoscopy;  Laterality: [name];  930   COLONOSCOPY WITH PROPOFOL  [name] 09/08/2020   Procedure: COLONOSCOPY WITH PROPOFOL ;  Surgeon: Golda Claudis PENNER, MD;  [facility];  Service: Endoscopy;  Laterality: [name];  915   CORONARY ARTERY BYPASS GRAFT [name] 04/08/2013   Procedure: CORONARY ARTERY BYPASS GRAFTING (CABG);  Surgeon: Maude Fleeta Ochoa, MD;  Location: [facility] OR;  Service: Open Heart Surgery;  Laterality: [name];  Times 3 using left internal mammary artery; endoscopically harvested right saphenous vein and left radial artery   ESOPHAGOGASTRODUODENOSCOPY (EGD) WITH PROPOFOL  [name] 12/09/2021   Procedure: ESOPHAGOGASTRODUODENOSCOPY (EGD) WITH PROPOFOL ;  Surgeon: Eartha Angelia Sieving, MD;  [facility];  Service: Gastroenterology;  Laterality: [name];  1250 ASA 2   INTRAOPERATIVE TRANSESOPHAGEAL ECHOCARDIOGRAM [name] 04/08/2013   Procedure: INTRAOPERATIVE TRANSESOPHAGEAL ECHOCARDIOGRAM;  Surgeon: Maude Fleeta Ochoa, MD;  Location: [facility] OR;  Service: Open Heart Surgery;  Laterality: [name];   INTRAOPERATIVE TRANSTHORACIC ECHOCARDIOGRAM [name] 07/08/2024   Procedure: ECHOCARDIOGRAM, TRANSTHORACIC;   Surgeon: Wendel Lurena POUR, MD;  [facility]: [facility] INVAS[facility] CV LAB;  Service: Cardiovascular;  Laterality: [name];   LAPAROSCOPIC APPENDECTOMY [name] 12/23/2015   Procedure: APPENDECTOMY LAPAROSCOPIC;  Surgeon: Donnice Lima, MD;  Location: [facility] OR;  Service: General;  Laterality: [name];   LEFT HEART CATHETERIZATION WITH CORONARY ANGIOGRAM [name] 04/07/2013   Procedure: LEFT HEART CATHETERIZATION WITH CORONARY ANGIOGRAM;  Surgeon: Alm LELON Clay, MD;  Location: [facility] CATH LAB;  Service: Cardiovascular;  Laterality: [name];   LYMPH NODE BIOPSY Left 1983   neck   MASTECTOMY Right 2012   w/axillary lymph node dissection   NM MYOVIEW  LTD  08/2019    EF 55 to 60%.  LOW RISK.  No ischemia or infarction noted.   NM TREADMILL MYOVIEW  LTD  08/06/2014   Exercise 5:07 min, 6.3 METS; symptoms noted or extreme dyspnea and lightheadedness with some chest tightness. No EKG changes. No ischemia or infarction was normal EF.   POLYPECTOMY  09/08/2020   Procedure: POLYPECTOMY;  Surgeon: Golda Claudis PENNER, MD;  [facility];  Service: Endoscopy;;   RADIAL ARTERY HARVEST Left 04/08/2013   Procedure: RADIAL ARTERY HARVEST;  Surgeon: Maude Fleeta Ochoa, MD;  Location: [facility] OR;  Service: Open Heart Surgery;  Laterality: Left;   RIGHT HEART CATH AND CORONARY ANGIOGRAPHY [name] 05/20/2024   Procedure: RIGHT HEART CATH AND CORONARY ANGIOGRAPHY;  Surgeon: Wendel Lurena POUR, MD;  [facility];  Service: Cardiovascular;  Laterality: [name];   SAVORY DILATION  12/09/2021   Procedure: SAVORY DILATION;  Surgeon: Eartha Angelia Sieving, MD;  [facility];  Service: Gastroenterology;;   TONSILLECTOMY  1974   TRANSTHORACIC ECHOCARDIOGRAM  08/06/2014; 02/2018   a) Nl EF 55-60%. Gr 1 DD-high LVEDP. Mod AoV Sclerosis w/o AS. Mild AI; mild MS - mean grad 7 mmHg @ HR 122 bpm.;; b) Initial: EF 55-60%, GRII DD.  HighLAP/LVEDP.  ? Bicuspid AoV -severely Ca2+.  Mild Ao root dilation.  Severe MAC, mild MS (MVA by P1/2 T & continuity  equation ~2 cm).  Mod RV dilation w/ mild- mod reduced EF.---> Limited w/Defiinity: No RWMA, Mild AS, Mod MS (~mean gradient 8 mmHg   TRANSTHORACIC ECHOCARDIOGRAM  02/2020   Normal LV size and function.  EF 65 to 70% no R WMA.  Mild LVH with basal septum.  GRII DD with elevated LVEDP.  Moderate reduced RV function with mild RV dilation, but normal PA pressures.  Mild LA dilation.  Moderate mitral stenosis, moderate AS & AI.  Mild MR, Mod MS (mean gradient 9 mmHg).  Moderate calcified AS w/ Mod AI -> Mean gradient 17.3 mmHg with peak 32 mm.   TRANSTHORACIC ECHOCARDIOGRAM  03/2019    EF 60-65%, mild concentric LVH. Gr 2 DD. No RWMA. Mild-Mod RV dilation & enlargement. Mild LA dilation. Mod MV Calcification . Mod MS (previously read as mild, but mean gradient is lower than and estimated valve area is higher than last check).  Mild-Mod AS (Mean Gradient 13.5 mmHg)   Social History:  reports that he quit smoking about 42 years ago. His smoking use included cigarettes. He started smoking about 57 years ago. He has a 15 pack-year smoking history. He has never used smokeless tobacco. He reports that he does not drink alcohol and does not use drugs.  No Known Allergies  Family History  Problem Relation Age of Onset   Alzheimer's disease Mother    Stroke Mother    Hypertension Mother    Hyperlipidemia Mother    Diabetes Mother    Hypertension Father    Diabetes Paternal Grandmother    Diabetes Paternal Grandfather    Breast cancer Paternal Aunt    Ovarian cancer Paternal Aunt     Prior to Admission medications   Medication Sig Start Date End Date Taking? Authorizing Provider  acetaminophen  (TYLENOL ) 500 MG tablet Take 500 mg by mouth every 6 (six) hours as needed for mild pain.    Yes [provider]  albuterol  (VENTOLIN  HFA) 108 (90 Base) MCG/ACT inhaler Inhale 2 puffs into the lungs as needed for wheezing or shortness of breath. 01/29/24  Yes [name], MD  ALPRAZolam  (XANAX ) 1 MG  tablet Take 1 mg by mouth at bedtime.   Yes [provider]  apixaban (ELIQUIS) 5 MG TABS tablet Take 1 tablet (5 mg total) by mouth 2 (two) times daily. 07/17/24  Yes Strader, [name], PA-C  Artificial Tear Solution (SOOTHE XP OP) Place 1 drop into both eyes daily as needed (dry eyes).   Yes [provider]  aspirin  EC 81 MG tablet Take 81 mg by mouth every evening. 09/09/20  Yes Rehman,  Claudis PENNER, MD  busPIRone (BUSPAR) 5 MG tablet Take 5 mg by mouth daily. 05/06/24  Yes [provider]  Cholecalciferol (VITAMIN D3) 50 MCG (2000 UT) capsule Take 2,000 Units by mouth daily.   Yes [provider]  citalopram  (CELEXA ) 20 MG tablet Take 40 mg by mouth every evening. 11/28/23  Yes [provider]  docusate sodium  (COLACE) 100 MG capsule Take 100 mg by mouth 2 (two) times a week.   Yes [provider]  famotidine  (PEPCID ) 20 MG tablet One after supper 04/16/24  Yes [name], MD  folic acid  (FOLVITE ) 400 MCG tablet Take 400 mcg by mouth daily.   Yes [provider]  Homeopathic Products (RESTFUL LEGS SL) Place 1 tablet under the tongue at bedtime.   Yes [provider]  levothyroxine  (SYNTHROID ) 75 MCG tablet Take 75 mcg by mouth at bedtime. 10/31/23  Yes [provider]  Magnesium  500 MG TABS Take 500 mg by mouth 3 (three) times a week.   Yes [provider]  nitroGLYCERIN  (NITROSTAT ) 0.4 MG SL tablet Place 1 tablet (0.4 mg total) under the tongue every 5 (five) minutes as needed for chest pain. 12/04/19  Yes [name], MD  OVER THE COUNTER MEDICATION Take 2 tablets by mouth daily. Ultra kidney complex   Yes [provider]  OVER THE COUNTER MEDICATION Take 1 tablet by mouth 3 (three) times a week. Herbal laxative otc supplement   Yes [provider]  pantoprazole  (PROTONIX ) 40 MG tablet Take 1 tablet (40 mg total) by mouth daily. Take 30-60 min before first meal of the day 04/16/24  Yes  [name], MD  pramipexole (MIRAPEX) 0.5 MG tablet Take 0.5 mg by mouth daily. 12/02/23  Yes [provider]  simvastatin  (ZOCOR ) 80 MG tablet Take 80 mg by mouth every evening.  10/13/19  Yes [provider]  SYMBICORT  80-4.5 MCG/ACT inhaler Inhale 2 puffs into the lungs 2 (two) times daily. 02/21/24  Yes [provider]  TURMERIC PO Take 800 mg by mouth daily.   Yes [provider]  vitamin B-12 (CYANOCOBALAMIN ) 500 MCG tablet Take 500 mcg by mouth daily.   Yes [provider]  atorvastatin  (LIPITOR ) 40 MG tablet Take 1 tablet (40 mg total) by mouth daily. 07/14/24   Strader, [name], PA-C  furosemide  (LASIX ) 20 MG tablet Take 1 tablet (20 mg total) by mouth daily. 07/13/24   Strader, [name], PA-C  losartan (COZAAR) 25 MG tablet Take 1 tablet (25 mg total) by mouth daily. 07/14/24   Strader, [name], PA-C  metoprolol  succinate (TOPROL -XL) 25 MG 24 hr tablet Take 0.5 tablets (12.5 mg total) by mouth 2 (two) times daily. 07/13/24   Johnson [name], PA-C    Physical Exam: Vitals:   07/13/24 0855 07/13/24 0924 07/13/24 1051 07/13/24 1112  BP:  (!) 154/74  (!) 154/63  Pulse:  (!) 115  94  Resp:  (!) 28  16  Temp: 98.5 F (36.9 C)  99.6 F (37.6 C) 100.2 F (37.9 C)  TempSrc: Oral  Oral Oral  SpO2:    93%  Weight:      Height:       His oral mucosa is dry Chest clear to auscultation CVS regular Abdomen soft nontender no suprapubic tenderness Extremities shows no edema Pacer site with Steri-Strips clean dry and intact   Family Communication: Daughter and wife at bedside Primary team communication: Discussed with primary team Thank  you very much for involving us  in the care of your patient.  Author: Almarie KANDICE Hoots, MD 07/13/2024 12:34 PM  For on call review [email or web address].

## 2024-07-13 NOTE — Progress Notes (Signed)
 This nurse obtained orders for D/c on arriving to the room patient was hot to the touch and Temp registered at 100.2 F. Structural heart Team notified. Orders received to hold d/c and obtain UA Chest x-ray Blood cultures x 2 and lactic acid. Order for Hospitalist consult obtained. Patient bed in lowest position with call light in reach. Safety ensured with current plan of care in progress

## 2024-07-13 NOTE — Progress Notes (Addendum)
   Rounding Note    Patient Name: Ryan [name] Date of Encounter: 07/13/2024  [facility] Cardiologist: Vishnu P Mallipeddi, MD   Subjective   Feeling OK this AM. In bed.  Vital Signs    Vitals:   07/13/24 0500 07/13/24 0550 07/13/24 0724 07/13/24 0804  BP:  (!) 172/76 (!) 176/75   Pulse:   (!) 103 (!) 112  Resp:  20 20 19   Temp:   98.5 F (36.9 C)   TempSrc:   Oral   SpO2:   97% 97%  Weight: 85 kg     Height:        Intake/Output Summary (Last 24 hours) at 07/13/2024 0900 Last data filed at 07/13/2024 0008 Gross per 24 hour  Intake 123 ml  Output 510 ml  Net -387 ml      07/13/2024    5:00 AM 07/12/2024    7:00 AM 07/11/2024    5:00 AM  Last 3 Weights  Weight (lbs) 187 lb 6.3 oz 187 lb 9.8 oz 196 lb 1.6 oz  Weight (kg) 85 kg 85.1 kg 88.95 kg      Telemetry    AF this AM. Rates in 110s. - Personally Reviewed   Physical Exam   GEN: No acute distress.   Cardiac: irregularly irregular, no murmurs, rubs, or gallops.  Left prepectoral pocket healing well without hematoma.   Respiratory: Clear to auscultation bilaterally. Psych: Normal affect   Assessment & Plan    #Complete heart block #Symptomatic bradycardia #Permanent pacemaker in situ Device functioning appropriately this morning on chest x-ray and device interrogation.  Site is healing well.  #AF Start Eliquis on 07/17/2024. Start metoprol succinate today.  Anticipate discharge later today.   Ole T. Cindie, MD, [facility], [facility] Cardiac Electrophysiology    ADDENDUM 10:55 AM  Patient febrile. Cancel discharge. Order 2v CXR and  Urinalysis  Ole T. Cindie, MD, [facility], [facility] Cardiac Electrophysiology

## 2024-07-13 NOTE — Progress Notes (Signed)
   Made aware by the patient's nurse that his temperature is mildly elevated at 99.6. Dr. Cindie made aware and is checking a UA and CXR. Will cancel the discharge order for now until results are available. Patient's family updated.  Signed, Laymon CHRISTELLA Qua, PA-C 07/13/2024, 11:02 AM

## 2024-07-13 NOTE — Progress Notes (Signed)
 Pharmacy Antibiotic Note  Ryan [name] is a 73 y.o. male admitted on 07/08/2024 with fever and recent pacer placement. Pharmacy has been consulted for vancomycin  dosing.   Patient was going to be discharged home today but developed a fever of 100.2 and WBC increased slightly to 12.5. Will start empiric antibiotics out of caution ISO recent pacemaker placement.  Plan: -Initiate vancomycin  2000 mg IV x1 -Initiate vancomycin  1250 mg IV q24h tomorrow, 10/6 (eAUC 469.7, cmin 11.9, Scr 1.62) -Follow up bcx, temp, WBC trend -Monitor renal function  Height: 6' 3 (190.5 cm) Weight: 85 kg (187 lb 6.3 oz) IBW/kg (Calculated) : 84.5  Temp (24hrs), Avg:98.6 F (37 C), Min:97.5 F (36.4 C), Max:100.2 F (37.9 C)  Recent Labs  Lab 07/09/24 0500 07/10/24 0223 07/11/24 0343 07/12/24 0405 07/13/24 0252  WBC 9.8 9.3 9.7 11.4* 12.5*  CREATININE 1.34* 1.65* 1.31* 1.69* 1.62*    Estimated Creatinine Clearance: 47.1 mL/min (A) (by C-G formula based on SCr of 1.62 mg/dL (H)).    No Known Allergies  Antimicrobials this admission: Vanc 10/5 >>  Ceftriaxone  10/5 >>   Microbiology results: 10/5 BCx: pending  Thank you for allowing pharmacy to be a part of this patient's care.  [name] Carl, PharmD PGY1 Pharmacy Resident

## 2024-07-14 ENCOUNTER — Inpatient Hospital Stay (HOSPITAL_COMMUNITY)

## 2024-07-14 ENCOUNTER — Encounter (HOSPITAL_COMMUNITY): Payer: Self-pay | Admitting: Cardiology

## 2024-07-14 DIAGNOSIS — I4891 Unspecified atrial fibrillation: Secondary | ICD-10-CM | POA: Diagnosis not present

## 2024-07-14 DIAGNOSIS — I480 Paroxysmal atrial fibrillation: Secondary | ICD-10-CM | POA: Diagnosis not present

## 2024-07-14 DIAGNOSIS — I10 Essential (primary) hypertension: Secondary | ICD-10-CM | POA: Diagnosis not present

## 2024-07-14 DIAGNOSIS — Z952 Presence of prosthetic heart valve: Secondary | ICD-10-CM | POA: Diagnosis not present

## 2024-07-14 LAB — CBC WITH DIFFERENTIAL/PLATELET
Abs Immature Granulocytes: 0.06 K/uL (ref 0.00–0.07)
Basophils Absolute: 0.1 K/uL (ref 0.0–0.1)
Basophils Relative: 1 %
Eosinophils Absolute: 0.3 K/uL (ref 0.0–0.5)
Eosinophils Relative: 3 %
HCT: 32.2 % — ABNORMAL LOW (ref 39.0–52.0)
Hemoglobin: 10.5 g/dL — ABNORMAL LOW (ref 13.0–17.0)
Immature Granulocytes: 1 %
Lymphocytes Relative: 8 %
Lymphs Abs: 0.8 K/uL (ref 0.7–4.0)
MCH: 29.7 pg (ref 26.0–34.0)
MCHC: 32.6 g/dL (ref 30.0–36.0)
MCV: 91 fL (ref 80.0–100.0)
Monocytes Absolute: 1.1 K/uL — ABNORMAL HIGH (ref 0.1–1.0)
Monocytes Relative: 11 %
Neutro Abs: 7.2 K/uL (ref 1.7–7.7)
Neutrophils Relative %: 76 %
Platelets: 102 K/uL — ABNORMAL LOW (ref 150–400)
RBC: 3.54 MIL/uL — ABNORMAL LOW (ref 4.22–5.81)
RDW: 14.6 % (ref 11.5–15.5)
WBC: 9.4 K/uL (ref 4.0–10.5)
nRBC: 0 % (ref 0.0–0.2)

## 2024-07-14 LAB — BASIC METABOLIC PANEL WITH GFR
Anion gap: 7 (ref 5–15)
BUN: 42 mg/dL — ABNORMAL HIGH (ref 8–23)
CO2: 25 mmol/L (ref 22–32)
Calcium: 8.1 mg/dL — ABNORMAL LOW (ref 8.9–10.3)
Chloride: 104 mmol/L (ref 98–111)
Creatinine, Ser: 1.74 mg/dL — ABNORMAL HIGH (ref 0.61–1.24)
GFR, Estimated: 40 mL/min — ABNORMAL LOW (ref >60)
Glucose, Bld: 112 mg/dL — ABNORMAL HIGH (ref 70–99)
Potassium: 4.5 mmol/L (ref 3.5–5.1)
Sodium: 136 mmol/L (ref 135–145)

## 2024-07-14 LAB — GLUCOSE, CAPILLARY: Glucose-Capillary: 120 mg/dL — ABNORMAL HIGH (ref 70–99)

## 2024-07-14 MED ORDER — SODIUM CHLORIDE 0.9 % IV SOLN: INTRAVENOUS | Status: DC

## 2024-07-14 MED FILL — Cefazolin Sodium-Dextrose IV Solution 2 GM/100ML-4%: INTRAVENOUS | Qty: 100 | Status: AC

## 2024-07-14 MED FILL — Midazolam HCl Inj 2 MG/2ML (Base Equivalent): INTRAMUSCULAR | Qty: 2 | Status: AC

## 2024-07-14 NOTE — Plan of Care (Signed)

## 2024-07-14 NOTE — Progress Notes (Signed)
  Patient Name: Ryan [name] Date of Encounter: 07/14/2024  Primary Cardiologist: Ryan SHAUNNA Maywood, MD Electrophysiologist: Ryan Kitty, MD  Interval Summary   Feeling OK this am.   Tmax 100.2 by chart, but RN had previously reported 102F axillary  Vital Signs    Vitals:   07/13/24 2348 07/14/24 0326 07/14/24 0500 07/14/24 0600  BP: (!) 106/59 (!) 109/55    Pulse: 79 62 63   Resp: 16 14 15    Temp: (!) 97.5 F (36.4 C)  (!) 97.5 F (36.4 C)   TempSrc: Axillary  Axillary   SpO2: 95% 96%    Weight:    90.5 kg  Height:        Intake/Output Summary (Last 24 hours) at 07/14/2024 0734 Last data filed at 07/14/2024 0600 Gross per 24 hour  Intake 1495.72 ml  Output 700 ml  Net 795.72 ml   Filed Weights   07/12/24 0700 07/13/24 0500 07/14/24 0600  Weight: 85.1 kg 85 kg 90.5 kg    Physical Exam    GEN- NAD, Alert and oriented  Lungs- Clear to ausculation bilaterally, normal work of breathing Cardiac- Regular rate and rhythm, no murmurs, rubs or gallops GI- soft, NT, ND, + BS Extremities- no clubbing or cyanosis. No edema  Telemetry    NSR 60-70s (personally reviewed)  Hospital Course    Ryan [name] is a 73 y.o. male admitted for TAVR with CHB after. Underwent Dual Chamber Abbott PPM 07/11/2024.  Had planned for discharge 10/5 but became febrile. Blood cultures drawn and started on ABx given recent pacer.   Assessment & Plan    #CHB #Symptomatic bradycardia #Permanent pacemaker in situ Stable device function CXR looks OK  #AF  Continue toprol .  Plan to start Eliquis 07/17/2024  #Febrile Tmax 100.2 but verbal report  BCx pending   For questions or updates, please contact [name] Please consult [email or web address] for contact info under     Signed, Ryan Prentice Passey, PA-C  07/14/2024, 7:34 AM

## 2024-07-14 NOTE — Progress Notes (Addendum)
 HEART AND VASCULAR CENTER   MULTIDISCIPLINARY HEART VALVE TEAM  Patient Name: Ryan [name] Date of Encounter: 07/14/2024  Admit date: 07/08/2024   PCP:  Shona Norleen PEDLAR, MD  [facility] Cardiologist:  [name], MD  [facility] Structural heart: [name], MD [facility] Electrophysiologist:  [name], MD   Hospital Problem List     Principal Problem:   S/P TAVR (transcatheter aortic valve replacement) Active Problems:   Invasive ductal carcinoma of right breast, stage 2- 2012 s/p chemo and mastectomy   Hodgkin's disease-1983   Hyperlipidemia with target LDL less than 70   Orthostatic hypotension   Coronary atherosclerosis of native coronary artery - multivessel CAD, status post CABG x3 (LIMA-LAD, Left Radial-RI, SVG-RCA)   Carotid artery stenosis   Essential hypertension   Severe aortic stenosis   Trifascicular block     Subjective   No complaints. Feeling okay.   Inpatient Medications    Scheduled Meds:  acetaminophen   1,000 mg Oral TID   aspirin  EC  81 mg Oral QPM   atorvastatin   40 mg Oral Daily   busPIRone  5 mg Oral Daily   citalopram   40 mg Oral QPM   famotidine   20 mg Oral QHS   fluticasone  furoate-vilanterol  1 puff Inhalation Daily   levothyroxine   75 mcg Oral QHS   metoprolol  succinate  12.5 mg Oral BID   pantoprazole   40 mg Oral Daily   pramipexole  0.5 mg Oral Daily   sodium chloride  flush  3 mL Intravenous Q12H   sodium chloride  flush  3 mL Intravenous Q12H   Continuous Infusions:  sodium chloride      cefTRIAXone  (ROCEPHIN )  IV Stopped (07/13/24 1437)   vancomycin      PRN Meds: sodium chloride , acetaminophen  **OR** acetaminophen , ALPRAZolam , melatonin, morphine  injection, ondansetron  (ZOFRAN ) IV, sodium chloride  flush   Vital Signs    Vitals:   07/13/24 2348 07/14/24 0326 07/14/24 0500 07/14/24 0600  BP: (!) 106/59 (!) 109/55    Pulse: 79 62 63   Resp: 16 14 15    Temp: (!) 97.5 F (36.4 C)  (!) 97.5 F  (36.4 C)   TempSrc: Axillary  Axillary   SpO2: 95% 96%    Weight:    90.5 kg  Height:        Intake/Output Summary (Last 24 hours) at 07/14/2024 0749 Last data filed at 07/14/2024 0600 Gross per 24 hour  Intake 1495.72 ml  Output 700 ml  Net 795.72 ml   Filed Weights   07/12/24 0700 07/13/24 0500 07/14/24 0600  Weight: 85.1 kg 85 kg 90.5 kg    Physical Exam    GEN: Well nourished, well developed, in no acute distress.  HEENT: Grossly normal.  Neck: Supple, no JVD or masses. Cardiac: RRR, no murmurs, rubs, or gallops. No clubbing, cyanosis, edema.   Respiratory:  Respirations regular and unlabored, clear to auscultation bilaterally. GI: Soft, nontender, nondistended, BS + x 4. MS: no deformity or atrophy. Skin: warm and dry, no rash.  Groin sites clear without hematoma or ecchymosis. Pacer site looks good Neuro:  Strength and sensation are intact. Psych: AAOx3.  Normal affect.  Labs    CBC Recent Labs    07/12/24 0405 07/13/24 0252  WBC 11.4* 12.5*  HGB 11.6* 11.7*  HCT 35.8* 35.7*  MCV 90.6 89.9  PLT 145* 126*   Basic Metabolic Panel: Recent Labs  Lab 07/09/24 0500 07/10/24 0223 07/11/24 0343 07/12/24 0405 07/13/24 0252  NA  136 138 137 136 132*  K 4.1 3.8 4.7 4.2 4.1  CL 103 106 107 103 100  CO2 23 24 26 25 24   GLUCOSE 112* 113* 121* 132* 164*  BUN 15 19 20  24* 31*  CREATININE 1.34* 1.65* 1.31* 1.69* 1.62*  CALCIUM  8.4* 8.2* 8.4* 8.1* 8.4*  MG 1.8  --  2.0  --   --    GFR: Estimated Creatinine Clearance: 47.1 mL/min (A) (by C-G formula based on SCr of 1.62 mg/dL (H)). Recent Labs  Lab 07/10/24 0223 07/11/24 0343 07/12/24 0405 07/13/24 0252 07/13/24 1511  WBC 9.3 9.7 11.4* 12.5*  --   LATICACIDVEN  --   --   --   --  1.0   No results for input(s): DDIMER in the last 72 hours. Hemoglobin A1C Recent Labs    07/13/24 0252  HGBA1C 6.1*     Telemetry    NSR, HR in 60-70s - Personally Reviewed  ECG    Atrial fibrillation with RVR,  RBBB, LAFB, HR 107 - Personally Reviewed  Patient Profile     Ryan [name] is a 73 y.o. male with a history of CAD s/p CABG x 3 LIMA-LAD, free LRA to RI, SVG-RCA (2014), DMT2 without insulin , stage 2 infiltrating ductal carcinoma of breast s/p Tamoxifen  and R mastectomy 2012, Hodgkin lymphoma s/p mantle XRT 1983, CKD stage IIIb, HFrEF, HLD, asthma, bilateral carotid artery stenosis, RBBB with LAFB, asthma, phrenic nerve palsy, severe MAC with mitral valve stenosis, and severe AS with AI who presented to [facility] on 07/[facility]24 for planned TAVR.   Assessment & Plan    Severe AS:  -- S/p successful TAVR with a 29 mm Edwards Sapien 3 Ultra Resilia THV via the TF approach on 07/08/24.  -- Post operative echo showed EF 60%, mild LVH, mod RVE, mod-severe MAC with mod MS and normally functioning TAVR with a mean gradient of 4 mmHg and trivial PVL. -- Groin sites are stable. -- Met with cardiac rehab to discuss CRP phase II.    RBBB with, 1st deg AV block and LAFB: -- Transient advanced heart block with TAVR requiring continued pacer support.  -- S/p Abbott dual-chamber PPM on 07/11/2024 by Dr. Kennyth.  -- Post-procedure CXR showed no pneumothorax.    Acute on chronic HFimpEF: -- EF 40%--> 60-65% on POD 1 echo. -- LVEDP 20 mm hg at the time of TAVR.  -- Treated with one dose of IV lasix  20mg  x 1. -- Appears euvolemic. Was hydrated w/ IV fluids yesterday.    AKI in the setting of CKD stage IIIb:  -- Creat 2.21 on PAT labs and lasix  held.  -- Creatinine at 1.62 yesterday.  -- No labs today, BMET placed.     PAF: -- New onset afib noted this admission.  -- CHADSVASC of at least 5. -- Started on Toprol  XL 12.5mg  BID.  -- Continue aspirin  81mg  daily and can start Eliquis on 07/17/2024 (per EP)   HTN:  -- BP well controlled.  -- Losartan and Lasix  held with hypotension. -- Continue Toprol  XL 12.5mg  BID for rate control of new onset afib.    CAD s/p CABG: -- [facility] 05/20/24 showed severe native CAD  with patent bypass grafts.  -- Continue medical therapy.  -- Continue simvastatin  80mg  daily.    Mitral valve disease: -- Severe MAC with mitral valve stenosis.   Fever with altered mentation:  -- On 07/13/2024, noted to be febrile to 102. Discharge cancelled.  -- Confusion felt  to be related oxycodone /morphine  which were discontinued.  -- UA and CXR obtained and unremarkable. CXR with atelectasis treated with incentive spiro. -- Lactic acid normal.  -- Blood cultures pending.  -- Started on vancomycin  and Rocephin  given recent TAVR/PPM placement. -- Appreciated hospital medicine.   Knee pain: -- From mechanical injury and does not appear infected.     Bonney Lamarr Hummer, PA-C  07/14/2024, 7:49 AM  Pager [phone]   ATTENDING ATTESTATION:  After conducting a review of all available clinical information with the care team, interviewing the patient, and performing a physical exam, I agree with the findings and plan described in this note.  Patient can stop aspirin  once Eliquis has been initiated.   GEN: No acute distress, AO x 3 HEENT:  MMM, no JVD, no scleral icterus Cardiac: RRR, no murmurs, rubs, or gallops.  Respiratory: Clear to auscultation bilaterally. GI: Soft, nontender, non-distended  MS: No edema; No deformity. Neuro:  Nonfocal  Vasc:  +2 radial pulses  Reviewed events over the weekend regards to fever.  Patient has remained afebrile over the last 24 hours.  He was started on antibiotic therapy.  Blood cultures are negative thus far.  Will follow-up blood cultures and hospital medicine recommendations.  Will plan on starting Eliquis on October 9.  Lurena Red, MD Pager [phone]

## 2024-07-14 NOTE — Progress Notes (Signed)
  HEART AND VASCULAR CENTER   MULTIDISCIPLINARY HEART VALVE TEAM  Called up to room about left arm pain. He has pain in his left shoulder and weakness. He can barely lift his arm without pain. Mild swelling noted throughout left arm. He feels like it would be better if he could move it more (within pacemaker restriction guidelines). Will ask Delon to move IV to right arm. I worry that he has become quite debilitated from laying in a hospital bed. Also right knee popped while walking yesterday and having pain there. Will consult PT. Of note, his daughter is a Archivist and lives with him and thinks she can help him at home.   Lamarr Hummer PA-C  [facility]  Pager [phone]

## 2024-07-14 NOTE — Progress Notes (Signed)
 Pt alert and mostly oriented, waxes & wanes, stood at bedside but shaky  LUE edematous and sore, site intact and dry with steristrips to L chest  Continent/incontinent, overnight episodes of incontinence but mostly able to void to urinal

## 2024-07-14 NOTE — Progress Notes (Signed)
 PROGRESS NOTE    Ryan [name]  FMW:[id number] DOB: [birth year] DOA: 07/08/2024 PCP: Shona Norleen PEDLAR, MD  Brief Narrative: 73 y.o. male with past medical history of hyperlipidemia, asthma, stage IIIa CKD, Hodgkin's lymphoma status post XRT, infiltrating ductal carcinoma of the right breast mastectomy in 2012 and tamoxifen  therapy CAD CABG in 2014 admitted for severe aortic stenosis now status post TAVR and complete heart block now status post pacemaker.  Patient spiked a temp of 102 today.  He denies any nausea vomiting diarrhea cough abdominal pain urinary symptoms.  He is confused and has poor memory.  Family thinks this confusion is not baseline for him.  Blood cultures UA and chest x-ray were done.  UA positive for ketones no evidence of UTI, chest x-ray with atelectasis, blood cultures pending.  Patient does appear dry and was given 1 L of normal saline.   Assessment & Plan:   Principal Problem:   S/P TAVR (transcatheter aortic valve replacement) Active Problems:   Essential hypertension   Invasive ductal carcinoma of right breast, stage 2- 2012 s/p chemo and mastectomy   Hodgkin's disease-1983   Hyperlipidemia with target LDL less than 70   Orthostatic hypotension   Coronary atherosclerosis of native coronary artery - multivessel CAD, status post CABG x3 (LIMA-LAD, Left Radial-RI, SVG-RCA)   Carotid artery stenosis   Severe aortic stenosis   Trifascicular block     #1 fever in the setting of recent pacemaker and TAVR-continue vancomycin  and Rocephin .  Pharmacy following [facility]. Will start patient on vancomycin  and Rocephin .  Will monitor renal functions closely.  Follow-up blood cultures. UA does not appear to have urinary tract infection Chest x-ray shows atelectasis encouraged him to use incentive spirometer hourly.   #2 complete heart block status post pacemaker per cardiology   #3 severe aortic stenosis status post TAVR per cardiology   #4 hypothyroidism on Synthroid    #5  hypertension patient on Cozaar metoprolol  Lasix  blood pressure 106/52 with a heart rate of 85 will hold Lasix  and Cozaar and give him NS 100 cc an hour for 1 L.  Continue metoprolol  hold for SBP less than 110. 07/14/2024 will hold metoprolol  with soft BP and increasing creatinine follow-up labs in AM.  IV fluids NS 100 cc an hour for 1 L.   #6 confusion likely from infectious source.  Family concerned that he got morphine  and oxycodone  and causing him to have more confusion.  Patient really does not have any complaints of pain.  Will stop oxy and Ultram .  Will order standing dose of Tylenol .    #7 left shoulder pain right knee pain obtain x-rays and venous Doppler of left upper extremity  consider DVT prophylaxis  Estimated body mass index is 24.94 kg/m as calculated from the following:   Height as of this encounter: 6' 3 (1.905 m).   Weight as of this encounter: 90.5 kg.   Subjective: Patient is resting in bed he is more awake and alert than yesterday he answered all my questions appropriately no.  Some confusion noted today afebrile.  Does complain of left shoulder pain.  Overnight nurse did not have anything to report Patient complaining of left shoulder pain and right knee pain x-rays ordered  Objective: Vitals:   07/14/24 0600 07/14/24 0755 07/14/24 0811 07/14/24 1217  BP:  (!) 103/56  (!) 103/50  Pulse:  60  65  Resp:  15  19  Temp:  97.6 F (36.4 C)  (!) 97.5 F (36.4 C)  TempSrc:  Oral  Oral  SpO2:  98% 100% 99%  Weight: 90.5 kg     Height:        Intake/Output Summary (Last 24 hours) at 07/14/2024 1526 Last data filed at 07/14/2024 1100 Gross per 24 hour  Intake 1492.72 ml  Output 1050 ml  Net 442.72 ml   Filed Weights   07/12/24 0700 07/13/24 0500 07/14/24 0600  Weight: 85.1 kg 85 kg 90.5 kg    Examination:  General exam: Appears calm and comfortable  Respiratory system: Clear to auscultation. Respiratory effort normal. Cardiovascular system: S1 & S2 heard,  RRR. No JVD, murmurs, rubs, gallops or clicks. No pedal edema. Gastrointestinal system: Abdomen is nondistended, soft and nontender. No organomegaly or masses felt. Normal bowel sounds heard. Central nervous system: Alert and oriented. No focal neurological deficits. Extremities: Symmetric 5 x 5 power. Skin: No rashes, lesions or ulcers Psychiatry: Judgement and insight appear normal. Mood & affect appropriate.     Data Reviewed: I have personally reviewed following labs and imaging studies  CBC: Recent Labs  Lab 07/10/24 0223 07/11/24 0343 07/12/24 0405 07/13/24 0252 07/14/24 0937  WBC 9.3 9.7 11.4* 12.5* 9.4  NEUTROABS  --   --   --   --  7.2  HGB 11.5* 11.4* 11.6* 11.7* 10.5*  HCT 36.0* 35.1* 35.8* 35.7* 32.2*  MCV 90.9 90.5 90.6 89.9 91.0  PLT 154 130* 145* 126* 102*   Basic Metabolic Panel: Recent Labs  Lab 07/09/24 0500 07/10/24 0223 07/11/24 0343 07/12/24 0405 07/13/24 0252 07/14/24 0937  NA 136 138 137 136 132* 136  K 4.1 3.8 4.7 4.2 4.1 4.5  CL 103 106 107 103 100 104  CO2 23 24 26 25 24 25   GLUCOSE 112* 113* 121* 132* 164* 112*  BUN 15 19 20  24* 31* 42*  CREATININE 1.34* 1.65* 1.31* 1.69* 1.62* 1.74*  CALCIUM  8.4* 8.2* 8.4* 8.1* 8.4* 8.1*  MG 1.8  --  2.0  --   --   --    GFR: Estimated Creatinine Clearance: 43.8 mL/min (A) (by C-G formula based on SCr of 1.74 mg/dL (H)). Liver Function Tests: No results for input(s): AST, ALT, ALKPHOS, BILITOT, PROT, ALBUMIN  in the last 168 hours. No results for input(s): LIPASE, AMYLASE in the last 168 hours. No results for input(s): AMMONIA in the last 168 hours. Coagulation Profile: No results for input(s): INR, PROTIME in the last 168 hours. Cardiac Enzymes: No results for input(s): CKTOTAL, CKMB, CKMBINDEX, TROPONINI in the last 168 hours. BNP (last 3 results) No results for input(s): PROBNP in the last 8760 hours. HbA1C: Recent Labs    07/13/24 0252  HGBA1C 6.1*    CBG: Recent Labs  Lab 07/14/24 0522  GLUCAP 120*   Lipid Profile: No results for input(s): CHOL, HDL, LDLCALC, TRIG, CHOLHDL, LDLDIRECT in the last 72 hours. Thyroid  Function Tests: No results for input(s): TSH, T4TOTAL, FREET4, T3FREE, THYROIDAB in the last 72 hours. Anemia Panel: No results for input(s): VITAMINB12, FOLATE, FERRITIN, TIBC, IRON , RETICCTPCT in the last 72 hours. Sepsis Labs: Recent Labs  Lab 07/13/24 1511  LATICACIDVEN 1.0    Recent Results (from the past 240 hours)  Surgical PCR screen     Status: None   Collection Time: 07/11/24 10:16 AM   Specimen: Nasal Mucosa; Nasal Swab  Result Value Ref Range Status   MRSA, PCR NEGATIVE NEGATIVE Final   Staphylococcus aureus NEGATIVE NEGATIVE Final    Comment: (NOTE) The Xpert SA Assay (FDA approved for  NASAL specimens in patients 75 years of age and older), is one component of a comprehensive surveillance program. It is not intended to diagnose infection nor to guide or monitor treatment. Performed at [facility], [address] [place], [place] [address]   Culture, blood (Routine X 2) w Reflex to ID Panel     Status: None (Preliminary result)   Collection Time: 07/13/24 11:30 AM   Specimen: BLOOD RIGHT ARM  Result Value Ref Range Status   Specimen Description BLOOD RIGHT ARM  Final   Special Requests   Final    BOTTLES DRAWN AEROBIC AND ANAEROBIC Blood Culture results may not be optimal due to an inadequate volume of blood received in culture bottles   Culture   Final    NO GROWTH < 24 HOURS Performed at [facility], [address], [place], [place] [address]    Report Status PENDING  Incomplete  Culture, blood (Routine X 2) w Reflex to ID Panel     Status: None (Preliminary result)   Collection Time: 07/13/24 11:38 AM   Specimen: BLOOD LEFT HAND  Result Value Ref Range Status   Specimen Description BLOOD LEFT HAND  Final   Special Requests   Final     BOTTLES DRAWN AEROBIC AND ANAEROBIC Blood Culture results may not be optimal due to an inadequate volume of blood received in culture bottles   Culture   Final    NO GROWTH < 24 HOURS Performed at [facility], [address] [place], [place] [address]    Report Status PENDING  Incomplete         Radiology Studies: DG Chest 2 View Result Date: 07/13/2024 CLINICAL DATA:  [id number] Fever [id number] EXAM: CHEST - 2 VIEW COMPARISON:  July 12, 2024, September 26, 25 FINDINGS: Evaluation is limited by suboptimal positioning and low lung volumes. The cardiomediastinal silhouette is unchanged in contour. Atherosclerotic calcifications. Status post TAVR and median sternotomy with CABG. LEFT chest cardiac pacing device. No pleural effusion. No pneumothorax. Mildly increased bibasilar linear appearing opacities. Similar degree of perihilar vascular fullness. Visualized abdomen is unremarkable. Multilevel degenerative changes of the thoracic spine. IMPRESSION: Increased bibasilar linear opacities are favored to reflect increased atelectasis on a background of vascular congestion. Electronically Signed   By: Corean Salter M.D.   On: 07/13/2024 12:12    Scheduled Meds:  acetaminophen   1,000 mg Oral TID   aspirin  EC  81 mg Oral QPM   atorvastatin   40 mg Oral Daily   busPIRone  5 mg Oral Daily   citalopram   40 mg Oral QPM   famotidine   20 mg Oral QHS   fluticasone  furoate-vilanterol  1 puff Inhalation Daily   levothyroxine   75 mcg Oral QHS   metoprolol  succinate  12.5 mg Oral BID   pantoprazole   40 mg Oral Daily   pramipexole  0.5 mg Oral Daily   sodium chloride  flush  3 mL Intravenous Q12H   sodium chloride  flush  3 mL Intravenous Q12H   Continuous Infusions:  sodium chloride      cefTRIAXone  (ROCEPHIN )  IV 2 g (07/14/24 1243)   vancomycin  1,250 mg (07/14/24 1410)     LOS: 6 days    Almarie KANDICE Hoots, MD  07/14/2024, 3:26 PM

## 2024-07-15 ENCOUNTER — Inpatient Hospital Stay (HOSPITAL_COMMUNITY)

## 2024-07-15 DIAGNOSIS — Z952 Presence of prosthetic heart valve: Secondary | ICD-10-CM | POA: Diagnosis not present

## 2024-07-15 DIAGNOSIS — I4891 Unspecified atrial fibrillation: Secondary | ICD-10-CM | POA: Diagnosis not present

## 2024-07-15 DIAGNOSIS — I350 Nonrheumatic aortic (valve) stenosis: Secondary | ICD-10-CM | POA: Diagnosis not present

## 2024-07-15 LAB — CBC
HCT: 31.2 % — ABNORMAL LOW (ref 39.0–52.0)
Hemoglobin: 10.1 g/dL — ABNORMAL LOW (ref 13.0–17.0)
MCH: 29.4 pg (ref 26.0–34.0)
MCHC: 32.4 g/dL (ref 30.0–36.0)
MCV: 90.7 fL (ref 80.0–100.0)
Platelets: 122 K/uL — ABNORMAL LOW (ref 150–400)
RBC: 3.44 MIL/uL — ABNORMAL LOW (ref 4.22–5.81)
RDW: 14.6 % (ref 11.5–15.5)
WBC: 8.3 K/uL (ref 4.0–10.5)
nRBC: 0 % (ref 0.0–0.2)

## 2024-07-15 LAB — BASIC METABOLIC PANEL WITH GFR
Anion gap: 6 (ref 5–15)
BUN: 41 mg/dL — ABNORMAL HIGH (ref 8–23)
CO2: 22 mmol/L (ref 22–32)
Calcium: 7.7 mg/dL — ABNORMAL LOW (ref 8.9–10.3)
Chloride: 108 mmol/L (ref 98–111)
Creatinine, Ser: 1.69 mg/dL — ABNORMAL HIGH (ref 0.61–1.24)
GFR, Estimated: 42 mL/min — ABNORMAL LOW (ref >60)
Glucose, Bld: 126 mg/dL — ABNORMAL HIGH (ref 70–99)
Potassium: 4.1 mmol/L (ref 3.5–5.1)
Sodium: 136 mmol/L (ref 135–145)

## 2024-07-15 MED ORDER — FUROSEMIDE 20 MG PO TABS: 20.0000 mg | ORAL_TABLET | ORAL | 6 refills | Status: DC | PRN

## 2024-07-15 NOTE — Plan of Care (Signed)
  Problem: Education: Goal: Knowledge of General Education information will improve Description: Including pain rating scale, medication(s)/side effects and non-pharmacologic comfort measures Outcome: Progressing   Problem: Health Behavior/Discharge Planning: Goal: Ability to manage health-related needs will improve Outcome: Progressing   Problem: Clinical Measurements: Goal: Ability to maintain clinical measurements within normal limits will improve Outcome: Progressing Goal: Will remain free from infection Outcome: Progressing   Problem: Elimination: Goal: Will not experience complications related to urinary retention Outcome: Progressing   Problem: Pain Managment: Goal: General experience of comfort will improve and/or be controlled Outcome: Progressing   Problem: Safety: Goal: Ability to remain free from injury will improve Outcome: Progressing   Problem: Education: Goal: Ability to safely manage health related needs after discharge will improve Outcome: Progressing

## 2024-07-15 NOTE — Progress Notes (Signed)
  Patient Name: Ryan [name] Date of Encounter: 07/15/2024  Primary Cardiologist: Vishnu P Mallipeddi, MD Electrophysiologist: [name], MD  Interval Summary   No new complaints today. Arm stiffness improved out of sling.   Vital Signs    Vitals:   07/14/24 2341 07/15/24 0316 07/15/24 0742 07/15/24 0751  BP: (!) 118/53 115/62  (!) 109/98  Pulse: 72 73 87 85  Resp: 20 19 17 14   Temp: 97.7 F (36.5 C) 97.7 F (36.5 C)  98.1 F (36.7 C)  TempSrc: Oral Oral  Oral  SpO2: 97% 98% 97% 97%  Weight:  92 kg    Height:        Intake/Output Summary (Last 24 hours) at 07/15/2024 0842 Last data filed at 07/15/2024 0329 Gross per 24 hour  Intake 1443.13 ml  Output 1000 ml  Net 443.13 ml   Filed Weights   07/13/24 0500 07/14/24 0600 07/15/24 0316  Weight: 85 kg 90.5 kg 92 kg    Physical Exam    GEN- NAD, Alert and oriented  Lungs- Clear to ausculation bilaterally, normal work of breathing Cardiac- Regular rate and rhythm, no murmurs, rubs or gallops GI- soft, NT, ND, + BS Extremities- no clubbing or cyanosis. No edema  Telemetry    NSR 60-70s (personally reviewed)  Hospital Course    Ryan [name] is a 73 y.o. male admitted for TAVR with CHB after. Underwent Dual Chamber Abbott PPM 07/11/2024.  Had planned for discharge 10/5 but became febrile. Blood cultures drawn and started on ABx given recent pacer.   Assessment & Plan    CHB Symptomatic bradycardia PPM in situ Stable device function  AF Plan to resume eliquis 07/17/2024  Fever BCx NGTD.  Likely OK to go home and complete oral course as long as BCx remain negative.   EP will see as needed while remains here. Usual follow up in place  For questions or updates, please contact [facility] Please consult [email or web address] for contact info under     Signed, Ozell Prentice Passey, PA-C  07/15/2024, 8:42 AM

## 2024-07-15 NOTE — Progress Notes (Signed)
   07/15/24 1245  TOC Brief Assessment  Insurance and Status Reviewed  Patient has primary care physician Yes  Home environment has been reviewed home w/ spouse  Prior level of function: self has RW  Prior/Current Home Services No current home services  Social Drivers of Health Review SDOH reviewed no interventions necessary  Readmission risk has been reviewed Yes  Transition of care needs no transition of care needs at this time    Pt stable for transition home today, no HH or DME needs noted.

## 2024-07-15 NOTE — Progress Notes (Signed)
   Inpatient Rehabilitation Admissions Coordinator   Rehab consult received. Current PT recommendations are for no PT follow up needed.  Heron Leavell, RN, MSN Rehab Admissions Coordinator [phone] 07/15/2024 8:32 AM

## 2024-07-15 NOTE — Progress Notes (Addendum)
 Discharge   Pt, wife and daughter at bedside.    Wife and daughter verbally understands discharge POC.  NO PIV or TELE on during discharge.  Education provided in AVS.  D/C to [facility]. Volunteer services notified.

## 2024-07-15 NOTE — Progress Notes (Signed)
 Physical Therapy Treatment Patient Details Name: Ryan [name] MRN: [id number] DOB: [birth year] Today's Date: 07/15/2024   History of Present Illness PT is 73 yo presenting to [facility] on 9/30 for planned TAVR. S/p pacemaker placementon 10/4. PMH: CAD s/p CABG x 3 LIMA-LAD, free lRA to RI, SVG-RCA, DM II, breast cancer s/p R mastectomy, Hodgkin lymphoma, CKD, HFrEF, HLD, asthma, bil carotid artery stenosis, RBBB with LAFB, asthma, phrenic nerve palsy, severe mAC with mitral valve stenosis and severe AS with AI.    PT Comments  Pt is progressing well towards goals. Currently pt is supervision for bed mobility, Min A to supervision for sit to stand and CGA to supervision for gait/stairs with RW and rail. Family is present and supportive; state they can provide assist at home. Due to pt current functional status, home set up and available assistance at home no recommended skilled physical therapy services at this time on discharge from acute care hospital setting. Will continue to follow in acute setting in order to ensure that pt returns home with decreased risk for falls, injury, re-hospitalization and improved activity tolerance.      If plan is discharge home, recommend the following: Assistance with cooking/housework;Assist for transportation;Help with stairs or ramp for entrance     Equipment Recommendations  None recommended by PT       Precautions / Restrictions Precautions Precautions: Fall;ICD/Pacemaker Recall of Precautions/Restrictions: Intact Restrictions Weight Bearing Restrictions Per Provider Order: No LUE Weight Bearing Per Provider Order: Non weight bearing     Mobility  Bed Mobility Overal bed mobility: Needs Assistance Bed Mobility: Supine to Sit     Supine to sit: Contact guard     General bed mobility comments: CGA with extra time and occasional cues for body mechanics. Minimal use of bed rail.    Transfers Overall transfer level: Needs assistance Equipment  used: Rolling walker (2 wheels) Transfers: Sit to/from Stand Sit to Stand: Min assist, Supervision, Contact guard assist           General transfer comment: initially Min A for sit to stand for initial momentum to get to standing and verbal cues for hand placement/body mechanics. 2nd and 3rd attempt improved progressively    Ambulation/Gait Ambulation/Gait assistance: Supervision, Contact guard assist Gait Distance (Feet): 400 Feet Assistive device: Rolling walker (2 wheels) Gait Pattern/deviations: Step-through pattern, Decreased stride length Gait velocity: decreased Gait velocity interpretation: <1.8 ft/sec, indicate of risk for recurrent falls   General Gait Details: 1x loss of balance, CGA for stabilization; overall supervision   Stairs Stairs: Yes Stairs assistance: Contact guard assist Stair Management: One rail Left, Step to pattern, Forwards Number of Stairs: 2 General stair comments: step to gait pattern ascending/descending. Daughter and spouse present to witness amount of assist required.     Balance Overall balance assessment: Needs assistance Sitting-balance support: Bilateral upper extremity supported, Feet supported Sitting balance-Leahy Scale: Good     Standing balance support: Bilateral upper extremity supported, Reliant on assistive device for balance, During functional activity Standing balance-Leahy Scale: Fair Standing balance comment: 1x LOB CGA to stabilize      Communication Communication Communication: No apparent difficulties  Cognition Arousal: Alert Behavior During Therapy: WFL for tasks assessed/performed   PT - Cognitive impairments: No apparent impairments   Following commands: Intact      Cueing Cueing Techniques: Verbal cues     General Comments General comments (skin integrity, edema, etc.): VSS on room air      Pertinent Vitals/Pain Pain Assessment Pain  Assessment: 0-10 Pain Score: 0-No pain Pain Location: L  shoulder Pain Descriptors / Indicators: Heaviness Pain Intervention(s): Limited activity within patient's tolerance, Monitored during session     PT Goals (current goals can now be found in the care plan section) Acute Rehab PT Goals Patient Stated Goal: To return home with family PT Goal Formulation: With patient Time For Goal Achievement: 07/26/24 Potential to Achieve Goals: Good Progress towards PT goals: Progressing toward goals    Frequency    Min 1X/week      PT Plan  Continue with current POC        AM-PAC PT 6 Clicks Mobility   Outcome Measure  Help needed turning from your back to your side while in a flat bed without using bedrails?: A Little Help needed moving from lying on your back to sitting on the side of a flat bed without using bedrails?: A Little Help needed moving to and from a bed to a chair (including a wheelchair)?: A Little Help needed standing up from a chair using your arms (e.g., wheelchair or bedside chair)?: A Little Help needed to walk in hospital room?: A Little Help needed climbing 3-5 steps with a railing? : A Little 6 Click Score: 18    End of Session Equipment Utilized During Treatment: Gait belt Activity Tolerance: Patient tolerated treatment well Patient left: in chair;with family/visitor present;with call bell/phone within reach Nurse Communication: Mobility status PT Visit Diagnosis: Unsteadiness on feet (R26.81);Muscle weakness (generalized) (M62.81)     Time: 9060-8986 PT Time Calculation (min) (ACUTE ONLY): 34 min  Charges:    $Therapeutic Activity: 23-37 mins PT General Charges $$ ACUTE PT VISIT: 1 Visit                    Dorothyann Maier, DPT, CLT  Acute Rehabilitation Services Office: [phone] (Secure chat preferred)    Dorothyann VEAR Maier 07/15/2024, 10:23 AM

## 2024-07-15 NOTE — Progress Notes (Signed)
 [facility] PROGRESS NOTE  LENNIS KORB (DOB: [birth year]) FMW:[id number] PCP: Shona Norleen PEDLAR, MD  Brief Narrative: 73 y.o. male with past medical history of hyperlipidemia, asthma, stage IIIa CKD, Hodgkin's lymphoma status post XRT, infiltrating ductal carcinoma of the right breast mastectomy in 2012 and tamoxifen  therapy CAD CABG in 2014 admitted for severe aortic stenosis now status post TAVR and complete heart block now status post pacemaker.  Patient spiked a temp of 102 today.  He denies any nausea vomiting diarrhea cough abdominal pain urinary symptoms.  He is confused and has poor memory.  Family thinks this confusion is not baseline for him.  Blood cultures UA and chest x-ray were done.  UA positive for ketones no evidence of UTI, chest x-ray with atelectasis, blood cultures pending.  Patient does appear dry and was given 1 L of normal saline.   Subjective: Feels great he's just bored. He reports his left shoulder is tender with movement, hard to localize it by pointing to a specific place.   Objective: BP 130/89 (BP Location: Right Arm)   Pulse 85   Temp 98 F (36.7 C) (Oral)   Resp 14   Ht 6' 3 (1.905 m)   Wt 92 kg   SpO2 97%   BMI 25.35 kg/m   Gen: No distress, pleasant Pulm: Clear, nonlabored  CV: Regular, no murmur.  GI: Soft, NT, ND, +BS Neuro: Alert and oriented. Right pupil constricted relative to left. Right is reactive, left is not. Slight ptosis on left. All of this is baseline per patient. No new focal deficits. Ext: Warm, no deformities. Left shoulder without erythema, deformity, or point tenderness. Pain limits shoulder abduction but has full painless passive ROM and sensation intact throughout.  Skin: Pacer pocket mildly tender without erythema or exudate or fluctuance.   Assessment & Plan: Fever: Unclear etiology, Tmax 100.52F. Too early for SSI. No evidence of infection on CXR or UA. Pacemaker site is stable without significant erythema or fluctuance.   - Suspect atelectasis contributed, suggest ongoing incentive spirometry.  - Blood cultures NGTD at 48 hours, strong NPV at this point. Pt feels exceedingly well, but is growing deconditioned in the hospital. I believe risks of ongoing hospitalization outweigh potential benefit. I discussed abx at DC with primary team. Since there's no identified source and he's been afebrile with negative cultures x48 hours, would monitor off of antibiotics. Return precautions discussed with patient this morning.    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 Needs PT.   Rest per primary.   Bernardino KATHEE Come, MD [facility] [email or web address] 07/15/2024, 2:47 PM

## 2024-07-15 NOTE — Care Management Important Message (Signed)
 Important Message  Patient Details  Name: Ryan [name] MRN: [id number] Date of Birth: 15-Mar-1951   Important Message Given:  Yes - Medicare IM     [name] 07/15/2024, 11:10 AM

## 2024-07-16 ENCOUNTER — Telehealth: Payer: Self-pay

## 2024-07-16 NOTE — Transitions of Care (Post Inpatient/ED Visit) (Signed)
   07/16/2024  Name: Ryan [name] MRN: [id number] DOB: 08-13-1951  Today's TOC FU Call Status: Today's TOC FU Call Status:: Unsuccessful Call (1st Attempt) Unsuccessful Call (1st Attempt) Date: 07/16/24  Attempted to reach the patient regarding the most recent Inpatient/ED visit.  Follow Up Plan: Additional outreach attempts will be made to reach the patient to complete the Transitions of Care (Post Inpatient/ED visit) call.   Jakye Mullens J. Esperansa Sarabia RN, MSN [facility], [facility] RN Care Manager Direct Dial: [phone]  Fax: [phone] Website: [email or web address]

## 2024-07-16 NOTE — Telephone Encounter (Signed)
 Received a call from Ryan [name] daughter, Ryan [name], tonight. The patient was discharged during the day after a TAVR and prolonged postop stay secondary to AMS and fevers which ultimately yielded a negative infectious work up. Cultures are still no growth. He was doing ok after discharge and somewhat back to normal but around 9 pm became agitated and altered in similar manner to how he was acting while hospitalized. Last temp 98. BP cuff not working and so unable to take BP and HR measurement. No other new symptoms. Given negative infectious workup while hospitalized and no further changes, I recommended monitoring for the time being over the next day or two. If he spikes additional fevers or is not improving from a mental standpoint Ryan [name] understands to re-engage the cardiology team. We discussed returning him to the ED but given no changes and delirium as the likely etiology of his AMS, we decided to defer for the time as this would likely worsen his delirium.

## 2024-07-17 ENCOUNTER — Telehealth: Payer: Self-pay

## 2024-07-17 ENCOUNTER — Ambulatory Visit: Admitting: Physician Assistant

## 2024-07-17 NOTE — Transitions of Care (Post Inpatient/ED Visit) (Signed)
 07/17/2024  Name: Ryan [name] MRN: [id number] DOB: [birth year]  Today's TOC FU Call Status: Today's TOC FU Call Status:: Successful TOC FU Call Completed TOC FU Call Complete Date: 07/17/24 Patient's Name and Date of Birth confirmed.  Transition Care Management Follow-up Telephone Call How have you been since you were released from the hospital?: Better (sleeping better, sore) Any questions or concerns?: No  Items Reviewed: Did you receive and understand the discharge instructions provided?: Yes Medications obtained,verified, and reconciled?: Yes (Medications Reviewed) Any new allergies since your discharge?: No Dietary orders reviewed?: Yes Type of Diet Ordered:: low salt, heart healthy Do you have support at home?: Yes People in Home [RPT]: spouse Name of Support/Comfort Primary Source: Delores  Medications Reviewed Today: Medications Reviewed Today     Reviewed by Rumalda Alan PENNER, RN (Registered Nurse) on 07/17/24 at 1407  Med List Status: <None>   Medication Order Taking? Sig Documenting Provider Last Dose Status Informant  acetaminophen  (TYLENOL ) 500 MG tablet [id number] Yes Take 500 mg by mouth every 6 (six) hours as needed for mild pain.  [provider]  Active Spouse/Significant Other  albuterol  (VENTOLIN  HFA) 108 (90 Base) MCG/ACT inhaler [id number] Yes Inhale 2 puffs into the lungs as needed for wheezing or shortness of breath. [name], MD  Active Spouse/Significant Other  ALPRAZolam  (XANAX ) 1 MG tablet [id number] Yes Take 1 mg by mouth at bedtime. [provider]  Active Spouse/Significant Other  apixaban (ELIQUIS) 5 MG TABS tablet [id number] Yes Take 1 tablet (5 mg total) by mouth 2 (two) times daily. Johnson [name], PA-C  Active   Artificial Tear Solution (SOOTHE XP OP) [id number] Yes Place 1 drop into both eyes daily as needed (dry eyes). [provider]  Active Spouse/Significant Other  busPIRone (BUSPAR) 5 MG tablet [id number]  Yes Take 5 mg by mouth daily. [provider]  Active Spouse/Significant Other  Cholecalciferol (VITAMIN D3) 50 MCG (2000 UT) capsule [id number] Yes Take 2,000 Units by mouth daily. [provider]  Active Spouse/Significant Other  citalopram  (CELEXA ) 20 MG tablet [id number] Yes Take 40 mg by mouth every evening. [provider]  Active Spouse/Significant Other  docusate sodium  (COLACE) 100 MG capsule [id number] Yes Take 100 mg by mouth 2 (two) times a week. [provider]  Active Spouse/Significant Other  famotidine  (PEPCID ) 20 MG tablet [id number] Yes One after supper [name], MD  Active Spouse/Significant Other  folic acid  (FOLVITE ) 400 MCG tablet [id number] Yes Take 400 mcg by mouth daily. [provider]  Active Spouse/Significant Other  furosemide  (LASIX ) 20 MG tablet [id number]  Take 1 tablet (20 mg total) by mouth as needed for edema or fluid.  Patient not taking: Reported on 07/17/2024   [name], PA-C  Active   Homeopathic Products (RESTFUL LEGS SL) [id number] Yes Place 1 tablet under the tongue at bedtime. [provider]  Active Spouse/Significant Other  levothyroxine  (SYNTHROID ) 75 MCG tablet [id number] Yes Take 75 mcg by mouth at bedtime. [provider]  Active Spouse/Significant Other  Magnesium  500 MG TABS [id number] Yes Take 500 mg by mouth 3 (three) times a week. [provider]  Active Spouse/Significant Other           Med Note KERRIN, MELISSA R   Thu May 15, 2024  1:47 PM)    metoprolol  succinate (TOPROL -XL) 25 MG 24 hr tablet [id number] Yes Take 0.5 tablets (12.5 mg total) by mouth 2 (two) times daily. Johnson [name], PA-C  Active   nitroGLYCERIN  (NITROSTAT ) 0.4 MG SL tablet [id number]  Place 1 tablet (0.4 mg total) under the tongue every 5 (five) minutes as needed for chest pain.  Patient not taking: Reported on 07/17/2024   [name], MD  Active Spouse/Significant Other  OVER THE  COUNTER MEDICATION [id number] Yes Take 2 tablets by mouth daily. Ultra kidney complex [provider]  Active Spouse/Significant Other  OVER THE COUNTER MEDICATION [id number] Yes Take 1 tablet by mouth 3 (three) times a week. Herbal laxative otc supplement [provider]  Active Spouse/Significant Other  pantoprazole  (PROTONIX ) 40 MG tablet [id number] Yes Take 1 tablet (40 mg total) by mouth daily. Take 30-60 min before first meal of the day [name], MD  Active Spouse/Significant Other  pramipexole (MIRAPEX) 0.5 MG tablet [id number] Yes Take 0.5 mg by mouth daily. [provider]  Active Spouse/Significant Other  simvastatin  (ZOCOR ) 80 MG tablet [id number] Yes Take 80 mg by mouth every evening.  [provider]  Active Spouse/Significant Other  SYMBICORT  80-4.5 MCG/ACT inhaler [id number] Yes Inhale 2 puffs into the lungs 2 (two) times daily. [provider]  Active Spouse/Significant Other  vitamin B-12 (CYANOCOBALAMIN ) 500 MCG tablet [id number] Yes Take 500 mcg by mouth daily. [provider]  Active Spouse/Significant Other            Home Care and Equipment/Supplies: Were Home Health Services Ordered?: No Any new equipment or medical supplies ordered?: No  Functional Questionnaire: Do you need assistance with bathing/showering or dressing?: Yes (family) Do you need assistance with meal preparation?: Yes (family) Do you need assistance with eating?: No Do you have difficulty maintaining continence: No Do you need assistance with getting out of bed/getting out of a chair/moving?: Yes (walker and pulling on family, cane) Do you have difficulty managing or taking your medications?: Yes (wife)  Follow up appointments reviewed: PCP Follow-up appointment confirmed?: No (Wife to call today) MD Provider Line [phone] Given: No Specialist Hospital Follow-up appointment confirmed?: Yes Date of Specialist follow-up appointment?:  07/24/24 Follow-Up Specialty Provider:: Cardiology Do you need transportation to your follow-up appointment?: No Do you understand care options if your condition(s) worsen?: Yes-patient verbalized understanding  SDOH Interventions Today    Flowsheet Row Most Recent Value  SDOH Interventions   Food Insecurity Interventions Intervention Not Indicated  Housing Interventions Intervention Not Indicated  Transportation Interventions Intervention Not Indicated  Utilities Interventions Intervention Not Indicated   Placed call to patient. Spoke with wife who is on the [place].  Wife reports that patient is doing well. Reports improved sleep last night.   Wife is managing all care and medications. Reviewed activity restrictions. Reviewed medications and ensure wife understood all medications. Reviewed bleeding precautions. Reviewed when to call 911.  Encouraged wife to call PCP office and schedule office visit for hospital follow up. She verbalized understanding. Reviewed incision care and signs and symptoms of infection.   Reviewed and offered 30 day TOC program and wife declined.  Provided my contact information for wife to call me back if needed. Reviewed if it is an emergency to call 911.   Alan Ee, RN, BSN, CEN [employer]- Transition of Care Team.  [facility] [phone]

## 2024-07-18 LAB — CULTURE, BLOOD (ROUTINE X 2)
Culture: NO GROWTH
Culture: NO GROWTH

## 2024-07-24 ENCOUNTER — Ambulatory Visit: Attending: Student in an Organized Health Care Education/Training Program

## 2024-07-24 DIAGNOSIS — I453 Trifascicular block: Secondary | ICD-10-CM | POA: Insufficient documentation

## 2024-07-24 DIAGNOSIS — Z952 Presence of prosthetic heart valve: Secondary | ICD-10-CM | POA: Diagnosis not present

## 2024-07-24 LAB — CUP PACEART INCLINIC DEVICE CHECK
Battery Remaining Longevity: 116 mo
Battery Voltage: 3.05 V
Brady Statistic RA Percent Paced: 13 %
Brady Statistic RV Percent Paced: 70 %
Date Time Interrogation Session: 20251016114554
Implantable Lead Connection Status: 753985
Implantable Lead Connection Status: 753985
Implantable Lead Implant Date: 20251003
Implantable Lead Implant Date: 20251003
Implantable Lead Location: 753859
Implantable Lead Location: 753860
Implantable Pulse Generator Implant Date: 20251003
Lead Channel Impedance Value: 425 Ohm
Lead Channel Impedance Value: 512.5 Ohm
Lead Channel Pacing Threshold Amplitude: 0.5 V
Lead Channel Pacing Threshold Amplitude: 0.5 V
Lead Channel Pacing Threshold Amplitude: 0.75 V
Lead Channel Pacing Threshold Amplitude: 0.75 V
Lead Channel Pacing Threshold Pulse Width: 0.5 ms
Lead Channel Pacing Threshold Pulse Width: 0.5 ms
Lead Channel Pacing Threshold Pulse Width: 0.5 ms
Lead Channel Pacing Threshold Pulse Width: 0.5 ms
Lead Channel Sensing Intrinsic Amplitude: 2.1 mV
Lead Channel Sensing Intrinsic Amplitude: 9.3 mV
Lead Channel Setting Pacing Amplitude: 1 V
Lead Channel Setting Pacing Amplitude: 3.5 V
Lead Channel Setting Pacing Pulse Width: 0.5 ms
Lead Channel Setting Sensing Sensitivity: 2 mV
Pulse Gen Model: 2272
Pulse Gen Serial Number: 8302328

## 2024-07-24 NOTE — Progress Notes (Signed)
 Normal dual chamber pacemaker wound check. Presenting rhythm: AS-AP/VP 60. Wound well healed. Routine testing performed. Thresholds, sensing, and impedance consistent with implant measurements and at 3.5V safety margin/auto capture until 3 month visit. Brief episodes of farfield oversensing noted at .  PVAB programmed to .  Will review with Dr. Kennyth and follow up. Reviewed arm restrictions to continue for 6 weeks total post op.  Pt enrolled in remote follow-up.

## 2024-07-24 NOTE — Progress Notes (Unsigned)
 Cardiology Office Note    Date:  07/26/2024  ID:  Ryan [name], DOB 1951/02/01, MRN [id number] Cardiologist: [name], MD Electrophysiologist:  [name], MD  Structural Heart:  [name], MD{ :  History of Present Illness:    Ryan [name] is a 73 y.o. male with past medical history of CAD (s/p CABG x3 with LIMA-LAD, free LRA-RI and SVG-RCA in 2014), Type II DM, HTN, HLD, carotid artery stenosis (known occluded right carotid), chronic HFrEF, asthma, aortic stenosis and history of ductal carcinoma (s/p right mastectomy) who presents to the office today for hospital follow-up.  He has been followed closely by the Structural Heart Team and was admitted for planned TAVR on 07/08/2024. He did have bradycardia after the procedure and temporary pacer was left in place. He did regain one-to-one conduction but developed increasing pacing requirements and ultimately underwent Abbott dual-chamber PPM by Dr. Kitty on 07/11/2024. Was also found to have atrial fibrillation during admission and anticoagulation was recommended but this was delayed until 07/17/2024 given PPM placement. Postoperative echocardiogram showed his EF had improved to 60% and he did have moderate to severe MAC with moderate MS and his TAVR valve was functioning normally with mean gradient at 4 mmHg and trivial PVL. During admission, he was noted to have a fever up to 102 and altered mental status. Confusion was felt to be due to Oxycodone /Morphine  which were discontinued. UA was normal and CXR showed atelectasis. Blood cultures showed no growth to date. Had empirically been started on Vancomycin  and Rocephin  given PPM placement but ultimately discontinued at discharge. Was discharged home on 07/15/2024 and cardiac medications at that time included Eliquis 5 mg twice daily, Lasix  20 mg as needed, Toprol -XL 12.5 mg twice daily and Simvastatin  80 mg daily.  In talking with the patient, his wife and his daughter today,  they report things are gradually improving since his hospitalization. He is still having memory issues and these were occurring prior to his hospitalization but have been more severe since. He is planning to undergo workup for dementia next month by his PCP. He denies any recent chest pain, palpitations, orthopnea or PND. Still having some lower extremity edema and dyspnea on exertion. Family members are encouraging him to utilize compression stockings and he has been taking Lasix  intermittently over the past week. Typically skips this on the days that he has doctors appointments. He remains on Eliquis for anticoagulation and experienced bruising along his groin sites but no swelling. No recent melena, hematochezia or hematuria.  Studies Reviewed:   EKG: EKG is not ordered today.   R/LHC: 05/2024   Mid LM to Dist LM lesion is 90% stenosed.   Ost LAD lesion is 80% stenosed.   1st Mrg lesion is 50% stenosed.   Prox RCA to Mid RCA lesion is 90% stenosed.   1.  Patent LIMA to LAD, left radial to ramus, and vein graft to RCA grafts. 2.  Severe native vessel disease. 3.  Fick cardiac output of 4.8 L/min and Fick cardiac index of 2.2 L/min/m with the following hemodynamics:            Right atrial pressure mean of 11 mmHg            Right ventricular pressure 65/22 with end-diastolic pressure of 10 mmHg            PA pressure 53/36 with a mean of 36 mmHg  Wedge pressure mean of 20 mmHg with V waves to 22 mmHg            PVR of 3.35            PA pulsatility index of 1.5 4.  Capacious iliofemoral vessels bilaterally   Recommendation: Continue evaluation for aortic valve intervention.  Increase Lasix  to 20 mg twice daily and check BMP in 1 week given elevated RA pressure and decreased PA pulsatility index.  Echocardiogram: 07/09/2024 IMPRESSIONS     1. Left ventricular ejection fraction, by estimation, is 60 to 65%. The  left ventricle has normal function. The left ventricle has no  regional  wall motion abnormalities. There is mild left ventricular hypertrophy.  Left ventricular diastolic parameters  are indeterminate.   2. Right ventricular systolic function is mildly reduced. The right  ventricular size is moderately enlarged.   3. The mitral valve is abnormal. Trivial mitral valve regurgitation.  Moderate mitral stenosis. The mean mitral valve gradient is 8.0 mmHg with  average heart rate of 68 bpm. Moderate to severe mitral annular  calcification.   4. Trivial posterior perivalvular leak. The aortic valve has been  repaired/replaced. Aortic valve regurgitation is trivial. No aortic  stenosis is present. There is a 29 mm Edwards Sapien prosthetic (TAVR)  valve present in the aortic position. Procedure  Date: 07/09/24. Aortic valve mean gradient measures 4.0 mmHg. Aortic valve  Vmax measures 1.37 m/s. Aortic valve acceleration time measures 99 msec.    Risk Assessment/Calculations:    CHA2DS2-VASc Score = 5  This indicates a 7.2% annual risk of stroke. The patient's score is based upon: CHF History: 1 HTN History: 0 Diabetes History: 1 Stroke History: 0 Vascular Disease History: 1 Age Score: 2 Gender Score: 0    Physical Exam:   VS:  BP 122/64   Pulse 74   Ht 6' 3 (1.905 m)   Wt 210 lb (95.3 kg)   SpO2 96%   BMI 26.25 kg/m    Wt Readings from Last 3 Encounters:  07/25/24 210 lb (95.3 kg)  07/15/24 202 lb 13.2 oz (92 kg)  06/30/24 199 lb 12.8 oz (90.6 kg)     GEN: Pleasant, elderly male appearing in no acute distress NECK: No JVD; No carotid bruits CARDIAC: RRR, soft flow murmur along RUSB. PPM site appears to be healing well with no drainage or significant erythema.  RESPIRATORY:  Clear to auscultation without rales, wheezing or rhonchi  ABDOMEN: Appears non-distended. No obvious abdominal masses. EXTREMITIES: No clubbing or cyanosis. Trace lower extremity edema bilaterally.  Distal pedal pulses are 2+ bilaterally. Groin sites with  ecchymosis noted but no hematoma.    Assessment and Plan:   1. Aortic stenosis, severe - Prior echocardiogram imaging had shown severe aortic stenosis and he underwent TAVR on 07/08/2024 with an Edwards Sapien 3 Resilia THV (size 29 mm, model # [id number]). Postoperative echocardiogram showed his TAVR valve was functioning normally with trivial perivalvular leak. - Hgb was trending up to 10.4 when checked earlier this week. LFT's were mildly elevated with AST 43 and ALT 53. He is scheduled for follow-up labs with his PCP within the next few weeks and will order a repeat CMET to be obtained at that time. He is scheduled for a repeat echocardiogram next month and was encouraged to keep that appointment.    2. Coronary artery disease involving native coronary artery of native heart without angina pectoris - He previously underwent CABG in 2014 with details as  outlined above and cardiac catheterization in 05/2024 showed patent grafts. He denies any recent anginal symptoms.  - He is no longer on ASA given the need for anticoagulation. Continue Toprol -XL 12.5mg  daily and statin therapy.   3. Symptomatic bradycardia - He developed heart block following TAVR and did undergo placement of an Abbott dual-chamber PPM by Dr. Kennyth on 07/11/2024. Device settings were adjusted at his wound check appointment earlier this week.   4. PAF (paroxysmal atrial fibrillation) (HCC) - He was diagnosed with atrial fibrillation during recent admission. Appears to be in NSR by examination today. On Toprol -XL 12.5 mg twice daily for rate control and Eliquis 5 mg twice daily for anticoagulation which is the appropriate dose given his current age, weight and renal function.  5. HFimpEF  - His ejection fraction was previously 40 to 45% by echocardiogram in 03/2024 and had normalized to 60 to 65% by postoperative echocardiogram on 07/09/2024. He does have trace edema on examination but lungs are clear. Was encouraged to continue to  use PRN Lasix  20mg .   6. Essential hypertension - BP is at well-controlled at 122/64 during today's visit. Continue Toprol -XL 12.5 mg twice daily.  7. Hyperlipidemia with target LDL less than 70 - FLP in 92/7974 showed LDL at 56. Continue current medical therapy with Simvastatin  80 mg daily.  8. Bilateral carotid artery stenosis - Carotid Dopplers in 12/2023 showed known occlusion of the RICA with 1 to 39% LICA stenosis. Followed by Vascular Surgery.  9. Stage 3 CKD - Creatinine was at 1.69 when checked on 07/15/2024 which is close to his known baseline. Stable at 1.63 when checked on 07/24/2024 by review of [facility] DXA.    Cardiac Rehabilitation Eligibility Assessment  The patient is ready to start cardiac rehabilitation from a cardiac standpoint.     Signed, Ryan CHRISTELLA Qua, PA-C

## 2024-07-24 NOTE — Patient Instructions (Signed)

## 2024-07-25 ENCOUNTER — Ambulatory Visit: Attending: Student | Admitting: Student

## 2024-07-25 VITALS — BP 122/64 | HR 74 | Ht 75.0 in | Wt 210.0 lb

## 2024-07-25 DIAGNOSIS — I2510 Atherosclerotic heart disease of native coronary artery without angina pectoris: Secondary | ICD-10-CM | POA: Diagnosis not present

## 2024-07-25 DIAGNOSIS — I480 Paroxysmal atrial fibrillation: Secondary | ICD-10-CM | POA: Insufficient documentation

## 2024-07-25 DIAGNOSIS — R001 Bradycardia, unspecified: Secondary | ICD-10-CM | POA: Insufficient documentation

## 2024-07-25 DIAGNOSIS — I10 Essential (primary) hypertension: Secondary | ICD-10-CM | POA: Diagnosis not present

## 2024-07-25 DIAGNOSIS — I350 Nonrheumatic aortic (valve) stenosis: Secondary | ICD-10-CM | POA: Diagnosis not present

## 2024-07-25 DIAGNOSIS — E785 Hyperlipidemia, unspecified: Secondary | ICD-10-CM | POA: Insufficient documentation

## 2024-07-25 DIAGNOSIS — I6523 Occlusion and stenosis of bilateral carotid arteries: Secondary | ICD-10-CM | POA: Insufficient documentation

## 2024-07-25 DIAGNOSIS — N1831 Chronic kidney disease, stage 3a: Secondary | ICD-10-CM | POA: Insufficient documentation

## 2024-07-25 DIAGNOSIS — I5020 Unspecified systolic (congestive) heart failure: Secondary | ICD-10-CM | POA: Diagnosis not present

## 2024-07-25 NOTE — Patient Instructions (Signed)
 Medication Instructions:   Your physician recommends that you continue on your current medications as directed. Please refer to the Current Medication list given to you today.   *If you need a refill on your cardiac medications before your next appointment, please call your pharmacy*   Lab Work:  PLEASE  CMET DRAWN  AT PRIMARY PROVIDER OFFICE     If you have labs (blood work) drawn today and your tests are completely normal, you will receive your results only by: MyChart Message (if you have MyChart) OR A paper copy in the mail If you have any lab test that is abnormal or we need to change your treatment, we will call you to review the results.    Testing/Procedures: NONE ORDERED  TODAY     Follow-Up: At [facility], you and your health needs are our priority.  As part of our continuing mission to provide you with exceptional heart care, our providers are all part of one team.  This team includes your primary Cardiologist (physician) and Advanced Practice Providers or APPs (Physician Assistants and Nurse Practitioners) who all work together to provide you with the care you need, when you need it.  Your next appointment:  AS SCHEDULED     We recommend signing up for the patient portal called MyChart.  Sign up information is provided on this After Visit Summary.  MyChart is used to connect with patients for Virtual Visits (Telemedicine).  Patients are able to view lab/test results, encounter notes, upcoming appointments, etc.  Non-urgent messages can be sent to your provider as well.   To learn more about what you can do with MyChart, go to [email or web address].   Other Instructions

## 2024-07-26 ENCOUNTER — Encounter: Payer: Self-pay | Admitting: Student

## 2024-07-28 ENCOUNTER — Ambulatory Visit: Admitting: Cardiology

## 2024-07-28 ENCOUNTER — Telehealth: Payer: Self-pay | Admitting: Internal Medicine

## 2024-07-28 NOTE — Telephone Encounter (Signed)
 Wife Sammie) wants a call back to address questions she has regarding patient's recent TAVR surgery.

## 2024-07-28 NOTE — Telephone Encounter (Signed)
Attempted to call pt, unable to reach. LMTCB.

## 2024-07-29 NOTE — Telephone Encounter (Signed)
 Spoke with pt's wife this morning and she wanted to know if the pt could be cleared to drive after his TAVR. Pt's wife stated pt had been cleared to drive from PPM standpoint, PPM wound check OV stated to continue arm restrictions for the next 6 weeks. She continued on to say that since the PPM was placed, the pt has had issues with clarity and forgets things easily. She has said he had been given morphine  in the hospital on numerous occasions but they did not want him to and that was about the time the confusion started. Pt had an infection in hospital and was given IV abx. Pt has continued to have issues with memory since. PPM wound check was completed on 07/24/24. Explained that we will send this information to Dr. Wendel to review and we will get back with any recommendations.

## 2024-08-01 ENCOUNTER — Encounter (HOSPITAL_COMMUNITY)
Admission: RE | Admit: 2024-08-01 | Discharge: 2024-08-01 | Disposition: A | Discharge status: Home / Self Care | Source: Ambulatory Visit | Attending: Internal Medicine | Admitting: Internal Medicine

## 2024-08-01 DIAGNOSIS — Z953 Presence of xenogenic heart valve: Secondary | ICD-10-CM | POA: Insufficient documentation

## 2024-08-01 NOTE — Progress Notes (Signed)
 Virtual orientation visit completed for cardiac rehab with S/P TAVR. On-site orientation visit scheduled for 08/06/24 at 2:30.

## 2024-08-06 ENCOUNTER — Encounter (HOSPITAL_COMMUNITY)
Admission: RE | Admit: 2024-08-06 | Discharge: 2024-08-06 | Disposition: A | Source: Ambulatory Visit | Attending: Internal Medicine

## 2024-08-06 VITALS — Ht 75.0 in | Wt 213.8 lb

## 2024-08-06 DIAGNOSIS — Z953 Presence of xenogenic heart valve: Secondary | ICD-10-CM

## 2024-08-06 NOTE — Progress Notes (Signed)
 Patient attend orientation today.  Patient is attending Cardiac Rehabilitation Program.  Documentation for diagnosis can be found in [facility].  Reviewed medical chart, RPE/RPD, gym safety, and program guidelines.  Patient was fitted to equipment they will be using during rehab.  Patient is scheduled to start exercise on 08/07/24.   Initial ITP created and sent for review and signature by Dr. Dorn Ross, Medical Director for Cardiac Rehabilitation Program.

## 2024-08-06 NOTE — Patient Instructions (Signed)
 Patient Instructions  Patient Details  Name: Ryan [name] MRN: [id number] Date of Birth: 01-Aug-1951 Referring Provider:  Thukkani, Arun K, MD  Below are your personal goals for exercise, nutrition, and risk factors. Our goal is to help you stay on track towards obtaining and maintaining these goals. We will be discussing your progress on these goals with you throughout the program.  Initial Exercise Prescription:  Initial Exercise Prescription - 08/06/24 1500       Date of Initial Exercise RX and Referring Provider   Date 08/06/24    Referring Provider Mallipeddi, Vishnu MD      NuStep   Level 1    SPM 50    Minutes 15    METs 1.9      REL-XR   Level 1    Speed 50    Minutes 15    METs 1.9      Prescription Details   Frequency (times per week) 2    Duration Progress to 30 minutes of continuous aerobic without signs/symptoms of physical distress      Intensity   THRR 40-80% of Max Heartrate 96-129    Ratings of Perceived Exertion 11-13    Perceived Dyspnea 0-4      Resistance Training   Training Prescription Yes    Weight 3    Reps 10-15          Exercise Goals: Frequency: Be able to perform aerobic exercise two to three times per week in program working toward 2-5 days per week of home exercise.  Intensity: Work with a perceived exertion of 11 (fairly light) - 15 (hard) while following your exercise prescription.  We will make changes to your prescription with you as you progress through the program.   Duration: Be able to do 30 to 45 minutes of continuous aerobic exercise in addition to a 5 minute warm-up and a 5 minute cool-down routine.   Nutrition Goals: Your personal nutrition goals will be established when you do your nutrition analysis with the dietician.  The following are general nutrition guidelines to follow: Cholesterol < 200mg /day Sodium < 1500mg /day Fiber: Men over 50 yrs - 30 grams per day  Personal Goals:  Personal Goals and Risk  Factors at Admission - 08/01/24 1356       Core Components/Risk Factors/Patient Goals on Admission    Weight Management Weight Maintenance    Improve shortness of breath with ADL's Yes    Intervention Provide education, individualized exercise plan and daily activity instruction to help decrease symptoms of SOB with activities of daily living.    Expected Outcomes Short Term: Improve cardiorespiratory fitness to achieve a reduction of symptoms when performing ADLs;Long Term: Be able to perform more ADLs without symptoms or delay the onset of symptoms    Diabetes Yes    Intervention Provide education about signs/symptoms and action to take for hypo/hyperglycemia.;Provide education about proper nutrition, including hydration, and aerobic/resistive exercise prescription along with prescribed medications to achieve blood glucose in normal ranges: Fasting glucose 65-99 mg/dL    Expected Outcomes Short Term: Participant verbalizes understanding of the signs/symptoms and immediate care of hyper/hypoglycemia, proper foot care and importance of medication, aerobic/resistive exercise and nutrition plan for blood glucose control.;Long Term: Attainment of HbA1C < 7%.    Hypertension Yes    Intervention Monitor prescription use compliance.;Provide education on lifestyle modifcations including regular physical activity/exercise, weight management, moderate sodium restriction and increased consumption of fresh fruit, vegetables, and low fat dairy, alcohol moderation, and  smoking cessation.    Expected Outcomes Short Term: Continued assessment and intervention until BP is < 140/46mm HG in hypertensive participants. < 130/16mm HG in hypertensive participants with diabetes, heart failure or chronic kidney disease.;Long Term: Maintenance of blood pressure at goal levels.    Lipids Yes    Intervention Provide education and support for participant on nutrition & aerobic/resistive exercise along with prescribed medications  to achieve LDL 70mg , HDL >40mg .    Expected Outcomes Short Term: Participant states understanding of desired cholesterol values and is compliant with medications prescribed. Participant is following exercise prescription and nutrition guidelines.;Long Term: Cholesterol controlled with medications as prescribed, with individualized exercise RX and with personalized nutrition plan. Value goals: LDL < 70mg , HDL > 40 mg.          Tobacco Use Initial Evaluation: Social History   Tobacco Use  Smoking Status Former   Current packs/day: 0.00   Average packs/day: 1 pack/day for 15.0 years (15.0 ttl pk-yrs)   Types: Cigarettes   Start date: 06/10/1967   Quit date: 06/09/1982   Years since quitting: 42.1  Smokeless Tobacco Never    Exercise Goals and Review:  Exercise Goals     Row Name 08/06/24 1549             Exercise Goals   Increase Physical Activity Yes       Intervention Provide advice, education, support and counseling about physical activity/exercise needs.;Develop an individualized exercise prescription for aerobic and resistive training based on initial evaluation findings, risk stratification, comorbidities and participant's personal goals.       Expected Outcomes Short Term: Attend rehab on a regular basis to increase amount of physical activity.;Long Term: Add in home exercise to make exercise part of routine and to increase amount of physical activity.;Long Term: Exercising regularly at least 3-5 days a week.       Increase Strength and Stamina Yes       Intervention Develop an individualized exercise prescription for aerobic and resistive training based on initial evaluation findings, risk stratification, comorbidities and participant's personal goals.;Provide advice, education, support and counseling about physical activity/exercise needs.       Expected Outcomes Short Term: Perform resistance training exercises routinely during rehab and add in resistance training at  home;Short Term: Increase workloads from initial exercise prescription for resistance, speed, and METs.;Long Term: Improve cardiorespiratory fitness, muscular endurance and strength as measured by increased METs and functional capacity ( )       Able to understand and use rate of perceived exertion (RPE) scale Yes       Intervention Provide education and explanation on how to use RPE scale       Expected Outcomes Short Term: Able to use RPE daily in rehab to express subjective intensity level;Long Term:  Able to use RPE to guide intensity level when exercising independently       Able to understand and use Dyspnea scale Yes       Intervention Provide education and explanation on how to use Dyspnea scale       Expected Outcomes Short Term: Able to use Dyspnea scale daily in rehab to express subjective sense of shortness of breath during exertion;Long Term: Able to use Dyspnea scale to guide intensity level when exercising independently       Knowledge and understanding of Target Heart Rate Range (THRR) Yes       Intervention Provide education and explanation of THRR including how the numbers were predicted and where they  are located for reference       Expected Outcomes Short Term: Able to state/look up THRR;Long Term: Able to use THRR to govern intensity when exercising independently;Short Term: Able to use daily as guideline for intensity in rehab       Able to check pulse independently Yes       Intervention Provide education and demonstration on how to check pulse in carotid and radial arteries.;Review the importance of being able to check your own pulse for safety during independent exercise       Expected Outcomes Short Term: Able to explain why pulse checking is important during independent exercise;Long Term: Able to check pulse independently and accurately       Understanding of Exercise Prescription Yes       Intervention Provide education, explanation, and written materials on patient's  individual exercise prescription       Expected Outcomes Short Term: Able to explain program exercise prescription;Long Term: Able to explain home exercise prescription to exercise independently          Copy of goals given to participant.

## 2024-08-06 NOTE — Progress Notes (Signed)
 Cardiac Individual Treatment Plan  Patient Details  Name: Ryan [name] MRN: [id number] Date of Birth: 1951-08-01 Referring Provider:   Flowsheet Row CARDIAC REHAB PHASE II ORIENTATION from 08/06/2024 in [facility] CARDIAC REHABILITATION  Referring Provider Stacia Beauvais MD    Initial Encounter Date:  Flowsheet Row CARDIAC REHAB PHASE II ORIENTATION from 08/06/2024 in [facility] CARDIAC REHABILITATION  Date 08/06/24    Visit Diagnosis: Status post transcatheter aortic valve replacement (TAVR) using bioprosthesis  Patient's Home Medications on Admission:  Current Outpatient Medications:    acetaminophen  (TYLENOL ) 500 MG tablet, Take 500 mg by mouth every 6 (six) hours as needed for mild pain. , Disp: , Rfl:    albuterol  (VENTOLIN  HFA) 108 (90 Base) MCG/ACT inhaler, Inhale 2 puffs into the lungs as needed for wheezing or shortness of breath., Disp: 18 each, Rfl: 2   ALPRAZolam  (XANAX ) 1 MG tablet, Take 1 mg by mouth at bedtime., Disp: , Rfl:    apixaban (ELIQUIS) 5 MG TABS tablet, Take 1 tablet (5 mg total) by mouth 2 (two) times daily., Disp: 60 tablet, Rfl: 5   Artificial Tear Solution (SOOTHE XP OP), Place 1 drop into both eyes daily as needed (dry eyes)., Disp: , Rfl:    busPIRone (BUSPAR) 5 MG tablet, Take 5 mg by mouth daily., Disp: , Rfl:    Cholecalciferol (VITAMIN D3) 50 MCG (2000 UT) capsule, Take 2,000 Units by mouth daily., Disp: , Rfl:    citalopram  (CELEXA ) 20 MG tablet, Take 40 mg by mouth every evening., Disp: , Rfl:    docusate sodium  (COLACE) 100 MG capsule, Take 100 mg by mouth 2 (two) times a week., Disp: , Rfl:    famotidine  (PEPCID ) 20 MG tablet, One after supper, Disp: 30 tablet, Rfl: 11   folic acid  (FOLVITE ) 400 MCG tablet, Take 400 mcg by mouth daily., Disp: , Rfl:    furosemide  (LASIX ) 20 MG tablet, Take 1 tablet (20 mg total) by mouth as needed for edema or fluid. (Patient taking differently: Take 40 mg by mouth daily.), Disp: 30 tablet, Rfl: 6    Homeopathic Products (RESTFUL LEGS SL), Place 1 tablet under the tongue at bedtime., Disp: , Rfl:    levothyroxine  (SYNTHROID ) 75 MCG tablet, Take 75 mcg by mouth at bedtime., Disp: , Rfl:    Magnesium  500 MG TABS, Take 500 mg by mouth 3 (three) times a week., Disp: , Rfl:    metoprolol  succinate (TOPROL -XL) 25 MG 24 hr tablet, Take 0.5 tablets (12.5 mg total) by mouth 2 (two) times daily., Disp: 45 tablet, Rfl: 1   nitroGLYCERIN  (NITROSTAT ) 0.4 MG SL tablet, Place 1 tablet (0.4 mg total) under the tongue every 5 (five) minutes as needed for chest pain., Disp: 25 tablet, Rfl: 12   OVER THE COUNTER MEDICATION, Take 2 tablets by mouth daily. Ultra kidney complex, Disp: , Rfl:    OVER THE COUNTER MEDICATION, Take 1 tablet by mouth 3 (three) times a week. Herbal laxative otc supplement, Disp: , Rfl:    pantoprazole  (PROTONIX ) 40 MG tablet, Take 1 tablet (40 mg total) by mouth daily. Take 30-60 min before first meal of the day, Disp: 30 tablet, Rfl: 2   pramipexole (MIRAPEX) 0.5 MG tablet, Take 0.5 mg by mouth daily., Disp: , Rfl:    simvastatin  (ZOCOR ) 80 MG tablet, Take 80 mg by mouth every evening. , Disp: , Rfl:    SYMBICORT  80-4.5 MCG/ACT inhaler, Inhale 2 puffs into the lungs 2 (two) times daily., Disp: ,  Rfl:    vitamin B-12 (CYANOCOBALAMIN ) 500 MCG tablet, Take 500 mcg by mouth daily., Disp: , Rfl:   Past Medical History: Past Medical History:  Diagnosis Date   Adie's pupil    Anemia 07/14/2013   Borderline diabetes    CAD (coronary artery disease), native coronary artery 04/09/2013   Dist LM stenosis 70-80% (at trifurcation into LAD, CX, & 2 Ramus branches, 70% mid RCA);; b) Myoview  08/06/14: Low Risk, no Ischemia/infarction   Depression 1983   after I was dx'd w/CA; now just comes in spells  (12/22/2015)   Essential hypertension    Exertional shortness of breath    Hodgkin's lymphoma (HCC) 1983   Hypercholesterolemia    Hypothyroidism    Invasive ductal carcinoma of breast, stage 2  (HCC) 04/27/2011   chemo, mastectomy   Left carotid artery partial occlusion 04/27/2011   CAROTIC DOPPLER 11/2013: < 40% R ICA stenosis, distal waveforms damped - suggests probably intracranial occlusoin, < 40% LICA, normal vertebrals -- no change   Mild aortic stenosis by prior echocardiogram 02/2018   By echo June 2020: Mild-Mod AS (Mean Gradient 13.5 mmHg)   Mild mitral stenosis by prior echocardiogram 08/06/2014   Most recent echo June 2020: Estimated valve area 2.83 cm (in 2019 was roughly 2 cm).  Mean gradient 6-8 mmHg.  Appears to be stable.   Personal history of chemotherapy    Pre-diabetes    Restless leg syndrome    Right carotid artery occlusion 04/27/2011   S/P CABG x 3 04/09/2013   LIMA-LAD, fLRAD-RI, SVG-RCA; Echo 10/29/'15:  mild Conc LVH, no WMA, Gr 1 DD, Mod AoV Sclerosis w/ Mod AI, ~Mild-Mod MS    S/P TAVR (transcatheter aortic valve replacement) 07/08/2024   s/p TAVR with a 29 mm Edwards Sapien 3 Ultra Resilia THV via the TF approach by Dr. Wendel and Dr. Paige   Sleep apnea    Stroke [facility])    2007   Thyroid  disease     Tobacco Use: Social History   Tobacco Use  Smoking Status Former   Current packs/day: 0.00   Average packs/day: 1 pack/day for 15.0 years (15.0 ttl pk-yrs)   Types: Cigarettes   Start date: 06/10/1967   Quit date: 06/09/1982   Years since quitting: 42.1  Smokeless Tobacco Never    Labs: Review Flowsheet  More data exists      Latest Ref Rng & Units 04/09/2013 08/22/2019 11/18/2019 05/20/2024 07/13/2024  Labs for ITP Cardiac and Pulmonary Rehab  Cholestrol 100 - 199 mg/dL - 788  858  - -  LDL (calc) 0 - 99 mg/dL - 855  65  - -  HDL-C >60 mg/dL - 53  62  - -  Trlycerides 0 - 149 mg/dL - 80  69  - -  Hemoglobin A1c 4.8 - 5.6 % - - - - 6.1   PH, Arterial 7.35 - 7.45 7.323  7.304  7.346  - - 7.365  -  PCO2 arterial 32 - 48 mmHg 46.5  49.3  47.5  - - 46.6  -  Bicarbonate 20.0 - 28.0 mmol/L 24.1  24.3  25.7  - - 28.7  31.0  28.2  26.6  -   TCO2 22 - 32 mmol/L 26  25  26  27   - - 30  33  30  28  -  Acid-base deficit 0.0 - 2.0 mmol/L 2.0  2.0  - - - -  O2 Saturation % 97.0  95.0  99.0  - -  55  59  57  91  -    Details       Multiple values from one day are sorted in reverse-chronological order          Exercise Target Goals: Exercise Program Goal: Individual exercise prescription set using results from initial 6 min walk test and THRR while considering  patient's activity barriers and safety.   Exercise Prescription Goal: Initial exercise prescription builds to 30-45 minutes a day of aerobic activity, 2-3 days per week.  Home exercise guidelines will be given to patient during program as part of exercise prescription that the participant will acknowledge.   Education: Aerobic Exercise: - Group verbal and visual presentation on the components of exercise prescription. Introduces F.I.T.T principle from ACSM for exercise prescriptions.  Reviews F.I.T.T. principles of aerobic exercise including progression. Written material provided at class time.   Education: Resistance Exercise: - Group verbal and visual presentation on the components of exercise prescription. Introduces F.I.T.T principle from ACSM for exercise prescriptions  Reviews F.I.T.T. principles of resistance exercise including progression. Written material provided at class time.    Education: Exercise & Equipment Safety: - Individual verbal instruction and demonstration of equipment use and safety with use of the equipment.   Education: Exercise Physiology & General Exercise Guidelines: - Group verbal and written instruction with models to review the exercise physiology of the cardiovascular system and associated critical values. Provides general exercise guidelines with specific guidelines to those with heart or lung disease. Written material provided at class time.   Education: Flexibility, Balance, Mind/Body Relaxation: - Group verbal and visual  presentation with interactive activity on the components of exercise prescription. Introduces F.I.T.T principle from ACSM for exercise prescriptions. Reviews F.I.T.T. principles of flexibility and balance exercise training including progression. Also discusses the mind body connection.  Reviews various relaxation techniques to help reduce and manage stress (i.e. Deep breathing, progressive muscle relaxation, and visualization). Balance handout provided to take home. Written material provided at class time.   Activity Barriers & Risk Stratification:  Activity Barriers & Cardiac Risk Stratification - 08/01/24 1335       Activity Barriers & Cardiac Risk Stratification   Activity Barriers Balance Concerns;Shortness of Breath;Arthritis;Deconditioning   Neruopathy bilaterally.   Cardiac Risk Stratification Moderate          6 Minute Walk:  6 Minute Walk     Row Name 08/06/24 1545         6 Minute Walk   Phase Initial     Distance 600 feet     Walk Time 4 minutes     # of Rest Breaks 1     MPH 1.7     METS 1.37     RPE 12     Perceived Dyspnea  3     VO2 Peak 4.8     Symptoms Yes (comment)     Comments one sitting break 2 min due to SOB     Resting HR 63 bpm     Resting BP 100/52     Resting Oxygen Saturation  96 %     Exercise Oxygen Saturation  during 6 min walk 95 %     Max Ex. HR 88 bpm     Max Ex. BP 120/60     2 Minute Post BP 112/60       Interval HR   Interval Heart Rate? Yes        Oxygen Initial Assessment:   Oxygen Re-Evaluation:   Oxygen  Discharge (Final Oxygen Re-Evaluation):   Initial Exercise Prescription:  Initial Exercise Prescription - 08/06/24 1500       Date of Initial Exercise RX and Referring Provider   Date 08/06/24    Referring Provider Mallipeddi, Vishnu MD      NuStep   Level 1    SPM 50    Minutes 15    METs 1.9      REL-XR   Level 1    Speed 50    Minutes 15    METs 1.9      Prescription Details   Frequency (times per  week) 2    Duration Progress to 30 minutes of continuous aerobic without signs/symptoms of physical distress      Intensity   THRR 40-80% of Max Heartrate 96-129    Ratings of Perceived Exertion 11-13    Perceived Dyspnea 0-4      Resistance Training   Training Prescription Yes    Weight 3    Reps 10-15          Perform Capillary Blood Glucose checks as needed.  Exercise Prescription Changes:   Exercise Prescription Changes     Row Name 08/06/24 1500             Response to Exercise   Blood Pressure (Admit) 100/52       Blood Pressure (Exercise) 120/60       Blood Pressure (Exit) 112/60       Heart Rate (Admit) 63 bpm       Heart Rate (Exercise) 88 bpm       Heart Rate (Exit) 70 bpm       Oxygen Saturation (Admit) 96 %       Oxygen Saturation (Exercise) 95 %       Oxygen Saturation (Exit) 96 %       Rating of Perceived Exertion (Exercise) 12       Perceived Dyspnea (Exercise) 3          Exercise Comments:   Exercise Comments     Row Name 08/06/24 1513           Exercise Comments Patient attend orientation today.  Patient is attending Cardiac Rehabilitation Program.  Documentation for diagnosis can be found in [facility].  Reviewed medical chart, RPE/RPD, gym safety, and program guidelines.  Patient was fitted to equipment they will be using during rehab.  Patient is scheduled to start exercise on 08/07/24.   Initial ITP created and sent for review and signature by Dr. Dorn Ross, Medical Director for Cardiac Rehabilitation Program.          Exercise Goals and Review:   Exercise Goals     Row Name 08/06/24 1549             Exercise Goals   Increase Physical Activity Yes       Intervention Provide advice, education, support and counseling about physical activity/exercise needs.;Develop an individualized exercise prescription for aerobic and resistive training based on initial evaluation findings, risk stratification, comorbidities and participant's  personal goals.       Expected Outcomes Short Term: Attend rehab on a regular basis to increase amount of physical activity.;Long Term: Add in home exercise to make exercise part of routine and to increase amount of physical activity.;Long Term: Exercising regularly at least 3-5 days a week.       Increase Strength and Stamina Yes       Intervention Develop an individualized exercise prescription for aerobic  and resistive training based on initial evaluation findings, risk stratification, comorbidities and participant's personal goals.;Provide advice, education, support and counseling about physical activity/exercise needs.       Expected Outcomes Short Term: Perform resistance training exercises routinely during rehab and add in resistance training at home;Short Term: Increase workloads from initial exercise prescription for resistance, speed, and METs.;Long Term: Improve cardiorespiratory fitness, muscular endurance and strength as measured by increased METs and functional capacity ( )       Able to understand and use rate of perceived exertion (RPE) scale Yes       Intervention Provide education and explanation on how to use RPE scale       Expected Outcomes Short Term: Able to use RPE daily in rehab to express subjective intensity level;Long Term:  Able to use RPE to guide intensity level when exercising independently       Able to understand and use Dyspnea scale Yes       Intervention Provide education and explanation on how to use Dyspnea scale       Expected Outcomes Short Term: Able to use Dyspnea scale daily in rehab to express subjective sense of shortness of breath during exertion;Long Term: Able to use Dyspnea scale to guide intensity level when exercising independently       Knowledge and understanding of Target Heart Rate Range (THRR) Yes       Intervention Provide education and explanation of THRR including how the numbers were predicted and where they are located for reference        Expected Outcomes Short Term: Able to state/look up THRR;Long Term: Able to use THRR to govern intensity when exercising independently;Short Term: Able to use daily as guideline for intensity in rehab       Able to check pulse independently Yes       Intervention Provide education and demonstration on how to check pulse in carotid and radial arteries.;Review the importance of being able to check your own pulse for safety during independent exercise       Expected Outcomes Short Term: Able to explain why pulse checking is important during independent exercise;Long Term: Able to check pulse independently and accurately       Understanding of Exercise Prescription Yes       Intervention Provide education, explanation, and written materials on patient's individual exercise prescription       Expected Outcomes Short Term: Able to explain program exercise prescription;Long Term: Able to explain home exercise prescription to exercise independently          Exercise Goals Re-Evaluation :   Discharge Exercise Prescription (Final Exercise Prescription Changes):  Exercise Prescription Changes - 08/06/24 1500       Response to Exercise   Blood Pressure (Admit) 100/52    Blood Pressure (Exercise) 120/60    Blood Pressure (Exit) 112/60    Heart Rate (Admit) 63 bpm    Heart Rate (Exercise) 88 bpm    Heart Rate (Exit) 70 bpm    Oxygen Saturation (Admit) 96 %    Oxygen Saturation (Exercise) 95 %    Oxygen Saturation (Exit) 96 %    Rating of Perceived Exertion (Exercise) 12    Perceived Dyspnea (Exercise) 3          Nutrition:  Target Goals: Understanding of nutrition guidelines, daily intake of sodium 1500mg , cholesterol 200mg , calories 30% from fat and 7% or less from saturated fats, daily to have 5 or more servings of fruits and vegetables.  Education: Nutrition 1 -Group instruction provided by verbal, written material, interactive activities, discussions, models, and posters to present  general guidelines for heart healthy nutrition including macronutrients, label reading, and promoting whole foods over processed counterparts. Education serves as pensions consultant of discussion of heart healthy eating for all. Written material provided at class time.    Education: Nutrition 2 -Group instruction provided by verbal, written material, interactive activities, discussions, models, and posters to present general guidelines for heart healthy nutrition including sodium, cholesterol, and saturated fat. Providing guidance of habit forming to improve blood pressure, cholesterol, and body weight. Written material provided at class time.     Biometrics:  Pre Biometrics - 08/06/24 1550       Pre Biometrics   Height 6' 3 (1.905 m)    Weight 97 kg    Waist Circumference 42 inches    Hip Circumference 43 inches    Waist to Hip Ratio 0.98 %    BMI (Calculated) 26.73    Grip Strength 29.7 kg    Single Leg Stand 3.08 seconds           Nutrition Therapy Plan and Nutrition Goals:   Nutrition Assessments:  Nutrition Assessments - 08/06/24 1534       Rate Your Plate Scores   Pre Score 59         MEDIFICTS Score Key: >=70 Need to make dietary changes  40-70 Heart Healthy Diet <= 40 Therapeutic Level Cholesterol Diet  Flowsheet Row CARDIAC REHAB PHASE II ORIENTATION from 08/06/2024 in [facility] CARDIAC REHABILITATION  Picture Your Plate Total Score on Admission 59   Picture Your Plate Scores: <59 Unhealthy dietary pattern with much room for improvement. 41-50 Dietary pattern unlikely to meet recommendations for good health and room for improvement. 51-60 More healthful dietary pattern, with some room for improvement.  >60 Healthy dietary pattern, although there may be some specific behaviors that could be improved.    Nutrition Goals Re-Evaluation:   Nutrition Goals Discharge (Final Nutrition Goals Re-Evaluation):   Psychosocial: Target Goals: Acknowledge presence  or absence of significant depression and/or stress, maximize coping skills, provide positive support system. Participant is able to verbalize types and ability to use techniques and skills needed for reducing stress and depression.   Education: Stress, Anxiety, and Depression - Group verbal and visual presentation to define topics covered.  Reviews how body is impacted by stress, anxiety, and depression.  Also discusses healthy ways to reduce stress and to treat/manage anxiety and depression. Written material provided at class time.   Education: Sleep Hygiene -Provides group verbal and written instruction about how sleep can affect your health.  Define sleep hygiene, discuss sleep cycles and impact of sleep habits. Review good sleep hygiene tips.   Initial Review & Psychosocial Screening:  Initial Psych Review & Screening - 08/01/24 1356       Initial Review   Current issues with Current Anxiety/Panic;Current Psychotropic Meds;Current Depression;History of Depression;Current Sleep Concerns      Family Dynamics   Good Support System? Yes    Comments Patient wife and daugher support him.      Barriers   Psychosocial barriers to participate in program The patient should benefit from training in stress management and relaxation.;There are no identifiable barriers or psychosocial needs.      Screening Interventions   Interventions To provide support and resources with identified psychosocial needs;Encouraged to exercise;Provide feedback about the scores to participant    Expected Outcomes Short Term goal: Utilizing psychosocial counselor,  staff and physician to assist with identification of specific Stressors or current issues interfering with healing process. Setting desired goal for each stressor or current issue identified.;Long Term Goal: Stressors or current issues are controlled or eliminated.;Short Term goal: Identification and review with participant of any Quality of Life or Depression  concerns found by scoring the questionnaire.;Long Term goal: The participant improves quality of Life and PHQ9 Scores as seen by post scores and/or verbalization of changes          Quality of Life Scores:   Quality of Life - 08/06/24 1539       Quality of Life   Select Quality of Life      Quality of Life Scores   Health/Function Pre 16.07 %    Socioeconomic Pre 23.58 %    Psych/Spiritual Pre 24.43 %    Family Pre 22.8 %    GLOBAL Pre 20.36 %         Scores of 19 and below usually indicate a poorer quality of life in these areas.  A difference of  2-3 points is a clinically meaningful difference.  A difference of 2-3 points in the total score of the Quality of Life Index has been associated with significant improvement in overall quality of life, self-image, physical symptoms, and general health in studies assessing change in quality of life.  PHQ-9: Review Flowsheet       08/06/2024 07/31/2014 05/14/2013  Depression screen PHQ 2/9  Decreased Interest 3 0 1  Down, Depressed, Hopeless 2 0 0  PHQ - 2 Score 5 0 1  Altered sleeping 3 - -  Tired, decreased energy 3 - -  Change in appetite 3 - -  Feeling bad or failure about yourself  2 - -  Trouble concentrating 2 - -  Moving slowly or fidgety/restless 2 - -  Suicidal thoughts 2 - -  PHQ-9 Score 22 - -  Difficult doing work/chores Somewhat difficult - -   Interpretation of Total Score  Total Score Depression Severity:  1-4 = Minimal depression, 5-9 = Mild depression, 10-14 = Moderate depression, 15-19 = Moderately severe depression, 20-27 = Severe depression   Psychosocial Evaluation and Intervention:  Psychosocial Evaluation - 08/01/24 1356       Psychosocial Evaluation & Interventions   Interventions Stress management education;Relaxation education;Encouraged to exercise with the program and follow exercise prescription    Comments Patient referred to cardiac rehab with S/P TAVR. Spoke with his wife and daugther for  virtual call. He has some memory issues that have worsened since his hospitalization. He is going to be screened for dementia by his pcp soon. He has had depression and anxiety long term and is being treated with aprazolam and celexa . He sleeps during the day and is up a lot at night. He has restless leg syndrome which affects his sleep. His daugther says their goals for him are to increase his energy levels and to improve his balance and stability walking. They hope the program will give him energy so he will sleep at night. He participated  in the program in 2016 when he had his CABG. He has no barriers to complete the program.    Expected Outcomes Short Term: Patient will start the program and attend consistently. Long Term: Patient will complete the program meeting personal goals.    Continue Psychosocial Services  Follow up required by staff          Psychosocial Re-Evaluation:   Psychosocial Discharge (Final Psychosocial  Re-Evaluation):   Vocational Rehabilitation: Provide vocational rehab assistance to qualifying candidates.   Vocational Rehab Evaluation & Intervention:  Vocational Rehab - 08/01/24 1355       Initial Vocational Rehab Evaluation & Intervention   Assessment shows need for Vocational Rehabilitation No      Vocational Rehab Re-Evaulation   Comments Patient is retired.          Education: Education Goals: Education classes will be provided on a variety of topics geared toward better understanding of heart health and risk factor modification. Participant will state understanding/return demonstration of topics presented as noted by education test scores.  Learning Barriers/Preferences:  Learning Barriers/Preferences - 08/01/24 1355       Learning Barriers/Preferences   Learning Barriers --   Family reports patient is having memory issues and is being screened for AD.   Learning Preferences Skilled Demonstration          General Cardiac Education  Topics:  AED/CPR: - Group verbal and written instruction with the use of models to demonstrate the basic use of the AED with the basic ABC's of resuscitation.   Test and Procedures: - Group verbal and visual presentation and models provide information about basic cardiac anatomy and function. Reviews the testing methods done to diagnose heart disease and the outcomes of the test results. Describes the treatment choices: Medical Management, Angioplasty, or Coronary Bypass Surgery for treating various heart conditions including Myocardial Infarction, Angina, Valve Disease, and Cardiac Arrhythmias. Written material provided at class time.   Medication Safety: - Group verbal and visual instruction to review commonly prescribed medications for heart and lung disease. Reviews the medication, class of the drug, and side effects. Includes the steps to properly store meds and maintain the prescription regimen. Written material provided at class time.   Intimacy: - Group verbal instruction through game format to discuss how heart and lung disease can affect sexual intimacy. Written material provided at class time.   Know Your Numbers and Heart Failure: - Group verbal and visual instruction to discuss disease risk factors for cardiac and pulmonary disease and treatment options.  Reviews associated critical values for Overweight/Obesity, Hypertension, Cholesterol, and Diabetes.  Discusses basics of heart failure: signs/symptoms and treatments.  Introduces Heart Failure Zone chart for action plan for heart failure. Written material provided at class time.   Infection Prevention: - Provides verbal and written material to individual with discussion of infection control including proper hand washing and proper equipment cleaning during exercise session.   Falls Prevention: - Provides verbal and written material to individual with discussion of falls prevention and safety.   Other: -Provides group and  verbal instruction on various topics (see comments)   Knowledge Questionnaire Score:   Core Components/Risk Factors/Patient Goals at Admission:  Personal Goals and Risk Factors at Admission - 08/01/24 1356       Core Components/Risk Factors/Patient Goals on Admission    Weight Management Weight Maintenance    Improve shortness of breath with ADL's Yes    Intervention Provide education, individualized exercise plan and daily activity instruction to help decrease symptoms of SOB with activities of daily living.    Expected Outcomes Short Term: Improve cardiorespiratory fitness to achieve a reduction of symptoms when performing ADLs;Long Term: Be able to perform more ADLs without symptoms or delay the onset of symptoms    Diabetes Yes    Intervention Provide education about signs/symptoms and action to take for hypo/hyperglycemia.;Provide education about proper nutrition, including hydration, and aerobic/resistive exercise prescription  along with prescribed medications to achieve blood glucose in normal ranges: Fasting glucose 65-99 mg/dL    Expected Outcomes Short Term: Participant verbalizes understanding of the signs/symptoms and immediate care of hyper/hypoglycemia, proper foot care and importance of medication, aerobic/resistive exercise and nutrition plan for blood glucose control.;Long Term: Attainment of HbA1C < 7%.    Hypertension Yes    Intervention Monitor prescription use compliance.;Provide education on lifestyle modifcations including regular physical activity/exercise, weight management, moderate sodium restriction and increased consumption of fresh fruit, vegetables, and low fat dairy, alcohol moderation, and smoking cessation.    Expected Outcomes Short Term: Continued assessment and intervention until BP is < 140/27mm HG in hypertensive participants. < 130/35mm HG in hypertensive participants with diabetes, heart failure or chronic kidney disease.;Long Term: Maintenance of blood  pressure at goal levels.    Lipids Yes    Intervention Provide education and support for participant on nutrition & aerobic/resistive exercise along with prescribed medications to achieve LDL 70mg , HDL >40mg .    Expected Outcomes Short Term: Participant states understanding of desired cholesterol values and is compliant with medications prescribed. Participant is following exercise prescription and nutrition guidelines.;Long Term: Cholesterol controlled with medications as prescribed, with individualized exercise RX and with personalized nutrition plan. Value goals: LDL < 70mg , HDL > 40 mg.          Education:Diabetes - Individual verbal and written instruction to review signs/symptoms of diabetes, desired ranges of glucose level fasting, after meals and with exercise. Acknowledge that pre and post exercise glucose checks will be done for 3 sessions at entry of program.   Core Components/Risk Factors/Patient Goals Review:    Core Components/Risk Factors/Patient Goals at Discharge (Final Review):    ITP Comments:  ITP Comments     Row Name 08/01/24 1417 08/06/24 1513         ITP Comments Virtual orientation visit completed for cardiac rehab with S/P TAVR. On-site orientation visit scheduled for 08/06/24 at 2:30. Patient attend orientation today.  Patient is attending Cardiac Rehabilitation Program.  Documentation for diagnosis can be found in [facility].  Reviewed medical chart, RPE/RPD, gym safety, and program guidelines.  Patient was fitted to equipment they will be using during rehab.  Patient is scheduled to start exercise on 08/07/24.   Initial ITP created and sent for review and signature by Dr. Dorn Ross, Medical Director for Cardiac Rehabilitation Program.         Comments:  Initial ITP.

## 2024-08-07 ENCOUNTER — Inpatient Hospital Stay (HOSPITAL_COMMUNITY): Admission: RE | Admit: 2024-08-07 | Discharge: 2024-08-07 | Attending: Internal Medicine

## 2024-08-07 DIAGNOSIS — Z953 Presence of xenogenic heart valve: Secondary | ICD-10-CM

## 2024-08-07 NOTE — Progress Notes (Addendum)
 Daily Session Note  Patient Details  Name: Ryan [name] MRN: [id number] Date of Birth: 1950/11/19 Referring Provider:   Flowsheet Row CARDIAC REHAB PHASE II ORIENTATION from 08/06/2024 in [facility] C[facility]AC [facility]  Referring Provider Stacia Beauvais MD    Encounter Date: 08/07/2024  Check In:  Session Check In - 08/07/24 1036       Check-In   Supervising physician immediately available to respond to emergencies See telemetry face sheet for immediately available MD    Location [facility]    Staff Present Powell Benders, BS, Exercise Physiologist;Saroya Riccobono Vonzell, MA, RCEP, CCRP, CCET;Hillary Troutman BSN, RN    Virtual Visit No    Medication changes reported     No    Fall or balance concerns reported    No    Warm-up and Cool-down Performed as group-led Writer Performed Yes    VAD Patient? No    PAD/SET Patient? No      Pain Assessment   Currently in Pain? No/denies          Capillary Blood Glucose: No results found for this or any previous visit (from the past 24 hours).    Social History   Tobacco Use  Smoking Status Former   Current packs/day: 0.00   Average packs/day: 1 pack/day for 15.0 years (15.0 ttl pk-yrs)   Types: Cigarettes   Start date: 06/10/1967   Quit date: 06/09/1982   Years since quitting: 42.1  Smokeless Tobacco Never    Goals Met:  Exercise tolerated well No report of concerns or symptoms today Strength training completed today  Goals Unmet:  Not Applicable  Comments: First full day of exercise!  Patient was oriented to gym and equipment including functions, settings, policies, and procedures.  Patient's individual exercise prescription and treatment plan were reviewed.  All starting workloads were established based on the results of the 6 minute walk test done at initial orientation visit.  The plan for exercise progression was also introduced and progression will be customized  based on patient's performance and goals.

## 2024-08-07 NOTE — Progress Notes (Signed)
 Cardiac Individual Treatment Plan  Patient Details  Name: Ryan [name] MRN: [id number] Date of Birth: 1951-08-01 Referring Provider:   Flowsheet Row CARDIAC REHAB PHASE II ORIENTATION from 08/06/2024 in [facility] CARDIAC REHABILITATION  Referring Provider Stacia Beauvais MD    Initial Encounter Date:  Flowsheet Row CARDIAC REHAB PHASE II ORIENTATION from 08/06/2024 in [facility] CARDIAC REHABILITATION  Date 08/06/24    Visit Diagnosis: Status post transcatheter aortic valve replacement (TAVR) using bioprosthesis  Patient's Home Medications on Admission:  Current Outpatient Medications:    acetaminophen  (TYLENOL ) 500 MG tablet, Take 500 mg by mouth every 6 (six) hours as needed for mild pain. , Disp: , Rfl:    albuterol  (VENTOLIN  HFA) 108 (90 Base) MCG/ACT inhaler, Inhale 2 puffs into the lungs as needed for wheezing or shortness of breath., Disp: 18 each, Rfl: 2   ALPRAZolam  (XANAX ) 1 MG tablet, Take 1 mg by mouth at bedtime., Disp: , Rfl:    apixaban (ELIQUIS) 5 MG TABS tablet, Take 1 tablet (5 mg total) by mouth 2 (two) times daily., Disp: 60 tablet, Rfl: 5   Artificial Tear Solution (SOOTHE XP OP), Place 1 drop into both eyes daily as needed (dry eyes)., Disp: , Rfl:    busPIRone (BUSPAR) 5 MG tablet, Take 5 mg by mouth daily., Disp: , Rfl:    Cholecalciferol (VITAMIN D3) 50 MCG (2000 UT) capsule, Take 2,000 Units by mouth daily., Disp: , Rfl:    citalopram  (CELEXA ) 20 MG tablet, Take 40 mg by mouth every evening., Disp: , Rfl:    docusate sodium  (COLACE) 100 MG capsule, Take 100 mg by mouth 2 (two) times a week., Disp: , Rfl:    famotidine  (PEPCID ) 20 MG tablet, One after supper, Disp: 30 tablet, Rfl: 11   folic acid  (FOLVITE ) 400 MCG tablet, Take 400 mcg by mouth daily., Disp: , Rfl:    furosemide  (LASIX ) 20 MG tablet, Take 1 tablet (20 mg total) by mouth as needed for edema or fluid. (Patient taking differently: Take 40 mg by mouth daily.), Disp: 30 tablet, Rfl: 6    Homeopathic Products (RESTFUL LEGS SL), Place 1 tablet under the tongue at bedtime., Disp: , Rfl:    levothyroxine  (SYNTHROID ) 75 MCG tablet, Take 75 mcg by mouth at bedtime., Disp: , Rfl:    Magnesium  500 MG TABS, Take 500 mg by mouth 3 (three) times a week., Disp: , Rfl:    metoprolol  succinate (TOPROL -XL) 25 MG 24 hr tablet, Take 0.5 tablets (12.5 mg total) by mouth 2 (two) times daily., Disp: 45 tablet, Rfl: 1   nitroGLYCERIN  (NITROSTAT ) 0.4 MG SL tablet, Place 1 tablet (0.4 mg total) under the tongue every 5 (five) minutes as needed for chest pain., Disp: 25 tablet, Rfl: 12   OVER THE COUNTER MEDICATION, Take 2 tablets by mouth daily. Ultra kidney complex, Disp: , Rfl:    OVER THE COUNTER MEDICATION, Take 1 tablet by mouth 3 (three) times a week. Herbal laxative otc supplement, Disp: , Rfl:    pantoprazole  (PROTONIX ) 40 MG tablet, Take 1 tablet (40 mg total) by mouth daily. Take 30-60 min before first meal of the day, Disp: 30 tablet, Rfl: 2   pramipexole (MIRAPEX) 0.5 MG tablet, Take 0.5 mg by mouth daily., Disp: , Rfl:    simvastatin  (ZOCOR ) 80 MG tablet, Take 80 mg by mouth every evening. , Disp: , Rfl:    SYMBICORT  80-4.5 MCG/ACT inhaler, Inhale 2 puffs into the lungs 2 (two) times daily., Disp: ,  Rfl:    vitamin B-12 (CYANOCOBALAMIN ) 500 MCG tablet, Take 500 mcg by mouth daily., Disp: , Rfl:   Past Medical History: Past Medical History:  Diagnosis Date   Adie's pupil    Anemia 07/14/2013   Borderline diabetes    CAD (coronary artery disease), native coronary artery 04/09/2013   Dist LM stenosis 70-80% (at trifurcation into LAD, CX, & 2 Ramus branches, 70% mid RCA);; b) Myoview  08/06/14: Low Risk, no Ischemia/infarction   Depression 1983   after I was dx'd w/CA; now just comes in spells  (12/22/2015)   Essential hypertension    Exertional shortness of breath    Hodgkin's lymphoma (HCC) 1983   Hypercholesterolemia    Hypothyroidism    Invasive ductal carcinoma of breast, stage 2  (HCC) 04/27/2011   chemo, mastectomy   Left carotid artery partial occlusion 04/27/2011   CAROTIC DOPPLER 11/2013: < 40% R ICA stenosis, distal waveforms damped - suggests probably intracranial occlusoin, < 40% LICA, normal vertebrals -- no change   Mild aortic stenosis by prior echocardiogram 02/2018   By echo June 2020: Mild-Mod AS (Mean Gradient 13.5 mmHg)   Mild mitral stenosis by prior echocardiogram 08/06/2014   Most recent echo June 2020: Estimated valve area 2.83 cm (in 2019 was roughly 2 cm).  Mean gradient 6-8 mmHg.  Appears to be stable.   Personal history of chemotherapy    Pre-diabetes    Restless leg syndrome    Right carotid artery occlusion 04/27/2011   S/P CABG x 3 04/09/2013   LIMA-LAD, fLRAD-RI, SVG-RCA; Echo 10/29/'15:  mild Conc LVH, no WMA, Gr 1 DD, Mod AoV Sclerosis w/ Mod AI, ~Mild-Mod MS    S/P TAVR (transcatheter aortic valve replacement) 07/08/2024   s/p TAVR with a 29 mm Edwards Sapien 3 Ultra Resilia THV via the TF approach by Dr. Wendel and Dr. Paige   Sleep apnea    Stroke [facility])    2007   Thyroid  disease     Tobacco Use: Social History   Tobacco Use  Smoking Status Former   Current packs/day: 0.00   Average packs/day: 1 pack/day for 15.0 years (15.0 ttl pk-yrs)   Types: Cigarettes   Start date: 06/10/1967   Quit date: 06/09/1982   Years since quitting: 42.1  Smokeless Tobacco Never    Labs: Review Flowsheet  More data exists      Latest Ref Rng & Units 04/09/2013 08/22/2019 11/18/2019 05/20/2024 07/13/2024  Labs for ITP Cardiac and Pulmonary Rehab  Cholestrol 100 - 199 mg/dL - 788  858  - -  LDL (calc) 0 - 99 mg/dL - 855  65  - -  HDL-C >60 mg/dL - 53  62  - -  Trlycerides 0 - 149 mg/dL - 80  69  - -  Hemoglobin A1c 4.8 - 5.6 % - - - - 6.1   PH, Arterial 7.35 - 7.45 7.323  7.304  7.346  - - 7.365  -  PCO2 arterial 32 - 48 mmHg 46.5  49.3  47.5  - - 46.6  -  Bicarbonate 20.0 - 28.0 mmol/L 24.1  24.3  25.7  - - 28.7  31.0  28.2  26.6  -   TCO2 22 - 32 mmol/L 26  25  26  27   - - 30  33  30  28  -  Acid-base deficit 0.0 - 2.0 mmol/L 2.0  2.0  - - - -  O2 Saturation % 97.0  95.0  99.0  - -  55  59  57  91  -    Details       Multiple values from one day are sorted in reverse-chronological order          Exercise Target Goals: Exercise Program Goal: Individual exercise prescription set using results from initial 6 min walk test and THRR while considering  patient's activity barriers and safety.   Exercise Prescription Goal: Initial exercise prescription builds to 30-45 minutes a day of aerobic activity, 2-3 days per week.  Home exercise guidelines will be given to patient during program as part of exercise prescription that the participant will acknowledge.   Education: Aerobic Exercise: - Group verbal and visual presentation on the components of exercise prescription. Introduces F.I.T.T principle from ACSM for exercise prescriptions.  Reviews F.I.T.T. principles of aerobic exercise including progression. Written material provided at class time.   Education: Resistance Exercise: - Group verbal and visual presentation on the components of exercise prescription. Introduces F.I.T.T principle from ACSM for exercise prescriptions  Reviews F.I.T.T. principles of resistance exercise including progression. Written material provided at class time.    Education: Exercise & Equipment Safety: - Individual verbal instruction and demonstration of equipment use and safety with use of the equipment.   Education: Exercise Physiology & General Exercise Guidelines: - Group verbal and written instruction with models to review the exercise physiology of the cardiovascular system and associated critical values. Provides general exercise guidelines with specific guidelines to those with heart or lung disease. Written material provided at class time.   Education: Flexibility, Balance, Mind/Body Relaxation: - Group verbal and visual  presentation with interactive activity on the components of exercise prescription. Introduces F.I.T.T principle from ACSM for exercise prescriptions. Reviews F.I.T.T. principles of flexibility and balance exercise training including progression. Also discusses the mind body connection.  Reviews various relaxation techniques to help reduce and manage stress (i.e. Deep breathing, progressive muscle relaxation, and visualization). Balance handout provided to take home. Written material provided at class time.   Activity Barriers & Risk Stratification:  Activity Barriers & Cardiac Risk Stratification - 08/01/24 1335       Activity Barriers & Cardiac Risk Stratification   Activity Barriers Balance Concerns;Shortness of Breath;Arthritis;Deconditioning   Neruopathy bilaterally.   Cardiac Risk Stratification Moderate          6 Minute Walk:  6 Minute Walk     Row Name 08/06/24 1545         6 Minute Walk   Phase Initial     Distance 600 feet     Walk Time 4 minutes     # of Rest Breaks 1     MPH 1.7     METS 1.37     RPE 12     Perceived Dyspnea  3     VO2 Peak 4.8     Symptoms Yes (comment)     Comments one sitting break 2 min due to SOB     Resting HR 63 bpm     Resting BP 100/52     Resting Oxygen Saturation  96 %     Exercise Oxygen Saturation  during 6 min walk 95 %     Max Ex. HR 88 bpm     Max Ex. BP 120/60     2 Minute Post BP 112/60       Interval HR   Interval Heart Rate? Yes        Oxygen Initial Assessment:   Oxygen Re-Evaluation:   Oxygen  Discharge (Final Oxygen Re-Evaluation):   Initial Exercise Prescription:  Initial Exercise Prescription - 08/06/24 1500       Date of Initial Exercise RX and Referring Provider   Date 08/06/24    Referring Provider Mallipeddi, Vishnu MD      NuStep   Level 1    SPM 50    Minutes 15    METs 1.9      REL-XR   Level 1    Speed 50    Minutes 15    METs 1.9      Prescription Details   Frequency (times per  week) 2    Duration Progress to 30 minutes of continuous aerobic without signs/symptoms of physical distress      Intensity   THRR 40-80% of Max Heartrate 96-129    Ratings of Perceived Exertion 11-13    Perceived Dyspnea 0-4      Resistance Training   Training Prescription Yes    Weight 3    Reps 10-15          Perform Capillary Blood Glucose checks as needed.  Exercise Prescription Changes:   Exercise Prescription Changes     Row Name 08/06/24 1500             Response to Exercise   Blood Pressure (Admit) 100/52       Blood Pressure (Exercise) 120/60       Blood Pressure (Exit) 112/60       Heart Rate (Admit) 63 bpm       Heart Rate (Exercise) 88 bpm       Heart Rate (Exit) 70 bpm       Oxygen Saturation (Admit) 96 %       Oxygen Saturation (Exercise) 95 %       Oxygen Saturation (Exit) 96 %       Rating of Perceived Exertion (Exercise) 12       Perceived Dyspnea (Exercise) 3          Exercise Comments:   Exercise Comments     Row Name 08/06/24 1513           Exercise Comments Patient attend orientation today.  Patient is attending Cardiac Rehabilitation Program.  Documentation for diagnosis can be found in [facility].  Reviewed medical chart, RPE/RPD, gym safety, and program guidelines.  Patient was fitted to equipment they will be using during rehab.  Patient is scheduled to start exercise on 08/07/24.   Initial ITP created and sent for review and signature by Dr. Dorn Ross, Medical Director for Cardiac Rehabilitation Program.          Exercise Goals and Review:   Exercise Goals     Row Name 08/06/24 1549             Exercise Goals   Increase Physical Activity Yes       Intervention Provide advice, education, support and counseling about physical activity/exercise needs.;Develop an individualized exercise prescription for aerobic and resistive training based on initial evaluation findings, risk stratification, comorbidities and participant's  personal goals.       Expected Outcomes Short Term: Attend rehab on a regular basis to increase amount of physical activity.;Long Term: Add in home exercise to make exercise part of routine and to increase amount of physical activity.;Long Term: Exercising regularly at least 3-5 days a week.       Increase Strength and Stamina Yes       Intervention Develop an individualized exercise prescription for aerobic  and resistive training based on initial evaluation findings, risk stratification, comorbidities and participant's personal goals.;Provide advice, education, support and counseling about physical activity/exercise needs.       Expected Outcomes Short Term: Perform resistance training exercises routinely during rehab and add in resistance training at home;Short Term: Increase workloads from initial exercise prescription for resistance, speed, and METs.;Long Term: Improve cardiorespiratory fitness, muscular endurance and strength as measured by increased METs and functional capacity ( )       Able to understand and use rate of perceived exertion (RPE) scale Yes       Intervention Provide education and explanation on how to use RPE scale       Expected Outcomes Short Term: Able to use RPE daily in rehab to express subjective intensity level;Long Term:  Able to use RPE to guide intensity level when exercising independently       Able to understand and use Dyspnea scale Yes       Intervention Provide education and explanation on how to use Dyspnea scale       Expected Outcomes Short Term: Able to use Dyspnea scale daily in rehab to express subjective sense of shortness of breath during exertion;Long Term: Able to use Dyspnea scale to guide intensity level when exercising independently       Knowledge and understanding of Target Heart Rate Range (THRR) Yes       Intervention Provide education and explanation of THRR including how the numbers were predicted and where they are located for reference        Expected Outcomes Short Term: Able to state/look up THRR;Long Term: Able to use THRR to govern intensity when exercising independently;Short Term: Able to use daily as guideline for intensity in rehab       Able to check pulse independently Yes       Intervention Provide education and demonstration on how to check pulse in carotid and radial arteries.;Review the importance of being able to check your own pulse for safety during independent exercise       Expected Outcomes Short Term: Able to explain why pulse checking is important during independent exercise;Long Term: Able to check pulse independently and accurately       Understanding of Exercise Prescription Yes       Intervention Provide education, explanation, and written materials on patient's individual exercise prescription       Expected Outcomes Short Term: Able to explain program exercise prescription;Long Term: Able to explain home exercise prescription to exercise independently          Exercise Goals Re-Evaluation :   Discharge Exercise Prescription (Final Exercise Prescription Changes):  Exercise Prescription Changes - 08/06/24 1500       Response to Exercise   Blood Pressure (Admit) 100/52    Blood Pressure (Exercise) 120/60    Blood Pressure (Exit) 112/60    Heart Rate (Admit) 63 bpm    Heart Rate (Exercise) 88 bpm    Heart Rate (Exit) 70 bpm    Oxygen Saturation (Admit) 96 %    Oxygen Saturation (Exercise) 95 %    Oxygen Saturation (Exit) 96 %    Rating of Perceived Exertion (Exercise) 12    Perceived Dyspnea (Exercise) 3          Nutrition:  Target Goals: Understanding of nutrition guidelines, daily intake of sodium 1500mg , cholesterol 200mg , calories 30% from fat and 7% or less from saturated fats, daily to have 5 or more servings of fruits and vegetables.  Education: Nutrition 1 -Group instruction provided by verbal, written material, interactive activities, discussions, models, and posters to present  general guidelines for heart healthy nutrition including macronutrients, label reading, and promoting whole foods over processed counterparts. Education serves as pensions consultant of discussion of heart healthy eating for all. Written material provided at class time.    Education: Nutrition 2 -Group instruction provided by verbal, written material, interactive activities, discussions, models, and posters to present general guidelines for heart healthy nutrition including sodium, cholesterol, and saturated fat. Providing guidance of habit forming to improve blood pressure, cholesterol, and body weight. Written material provided at class time.     Biometrics:  Pre Biometrics - 08/06/24 1550       Pre Biometrics   Height 6' 3 (1.905 m)    Weight 97 kg    Waist Circumference 42 inches    Hip Circumference 43 inches    Waist to Hip Ratio 0.98 %    BMI (Calculated) 26.73    Grip Strength 29.7 kg    Single Leg Stand 3.08 seconds           Nutrition Therapy Plan and Nutrition Goals:   Nutrition Assessments:  Nutrition Assessments - 08/06/24 1534       Rate Your Plate Scores   Pre Score 59         MEDIFICTS Score Key: >=70 Need to make dietary changes  40-70 Heart Healthy Diet <= 40 Therapeutic Level Cholesterol Diet  Flowsheet Row CARDIAC REHAB PHASE II ORIENTATION from 08/06/2024 in [facility] CARDIAC REHABILITATION  Picture Your Plate Total Score on Admission 59   Picture Your Plate Scores: <59 Unhealthy dietary pattern with much room for improvement. 41-50 Dietary pattern unlikely to meet recommendations for good health and room for improvement. 51-60 More healthful dietary pattern, with some room for improvement.  >60 Healthy dietary pattern, although there may be some specific behaviors that could be improved.    Nutrition Goals Re-Evaluation:   Nutrition Goals Discharge (Final Nutrition Goals Re-Evaluation):   Psychosocial: Target Goals: Acknowledge presence  or absence of significant depression and/or stress, maximize coping skills, provide positive support system. Participant is able to verbalize types and ability to use techniques and skills needed for reducing stress and depression.   Education: Stress, Anxiety, and Depression - Group verbal and visual presentation to define topics covered.  Reviews how body is impacted by stress, anxiety, and depression.  Also discusses healthy ways to reduce stress and to treat/manage anxiety and depression. Written material provided at class time.   Education: Sleep Hygiene -Provides group verbal and written instruction about how sleep can affect your health.  Define sleep hygiene, discuss sleep cycles and impact of sleep habits. Review good sleep hygiene tips.   Initial Review & Psychosocial Screening:  Initial Psych Review & Screening - 08/01/24 1356       Initial Review   Current issues with Current Anxiety/Panic;Current Psychotropic Meds;Current Depression;History of Depression;Current Sleep Concerns      Family Dynamics   Good Support System? Yes    Comments Patient wife and daugher support him.      Barriers   Psychosocial barriers to participate in program The patient should benefit from training in stress management and relaxation.;There are no identifiable barriers or psychosocial needs.      Screening Interventions   Interventions To provide support and resources with identified psychosocial needs;Encouraged to exercise;Provide feedback about the scores to participant    Expected Outcomes Short Term goal: Utilizing psychosocial counselor,  staff and physician to assist with identification of specific Stressors or current issues interfering with healing process. Setting desired goal for each stressor or current issue identified.;Long Term Goal: Stressors or current issues are controlled or eliminated.;Short Term goal: Identification and review with participant of any Quality of Life or Depression  concerns found by scoring the questionnaire.;Long Term goal: The participant improves quality of Life and PHQ9 Scores as seen by post scores and/or verbalization of changes          Quality of Life Scores:   Quality of Life - 08/06/24 1539       Quality of Life   Select Quality of Life      Quality of Life Scores   Health/Function Pre 16.07 %    Socioeconomic Pre 23.58 %    Psych/Spiritual Pre 24.43 %    Family Pre 22.8 %    GLOBAL Pre 20.36 %         Scores of 19 and below usually indicate a poorer quality of life in these areas.  A difference of  2-3 points is a clinically meaningful difference.  A difference of 2-3 points in the total score of the Quality of Life Index has been associated with significant improvement in overall quality of life, self-image, physical symptoms, and general health in studies assessing change in quality of life.  PHQ-9: Review Flowsheet       08/06/2024 07/31/2014 05/14/2013  Depression screen PHQ 2/9  Decreased Interest 3 0 1  Down, Depressed, Hopeless 2 0 0  PHQ - 2 Score 5 0 1  Altered sleeping 3 - -  Tired, decreased energy 3 - -  Change in appetite 3 - -  Feeling bad or failure about yourself  2 - -  Trouble concentrating 2 - -  Moving slowly or fidgety/restless 2 - -  Suicidal thoughts 2 - -  PHQ-9 Score 22 - -  Difficult doing work/chores Somewhat difficult - -   Interpretation of Total Score  Total Score Depression Severity:  1-4 = Minimal depression, 5-9 = Mild depression, 10-14 = Moderate depression, 15-19 = Moderately severe depression, 20-27 = Severe depression   Psychosocial Evaluation and Intervention:  Psychosocial Evaluation - 08/01/24 1356       Psychosocial Evaluation & Interventions   Interventions Stress management education;Relaxation education;Encouraged to exercise with the program and follow exercise prescription    Comments Patient referred to cardiac rehab with S/P TAVR. Spoke with his wife and daugther for  virtual call. He has some memory issues that have worsened since his hospitalization. He is going to be screened for dementia by his pcp soon. He has had depression and anxiety long term and is being treated with aprazolam and celexa . He sleeps during the day and is up a lot at night. He has restless leg syndrome which affects his sleep. His daugther says their goals for him are to increase his energy levels and to improve his balance and stability walking. They hope the program will give him energy so he will sleep at night. He participated  in the program in 2016 when he had his CABG. He has no barriers to complete the program.    Expected Outcomes Short Term: Patient will start the program and attend consistently. Long Term: Patient will complete the program meeting personal goals.    Continue Psychosocial Services  Follow up required by staff          Psychosocial Re-Evaluation:   Psychosocial Discharge (Final Psychosocial  Re-Evaluation):   Vocational Rehabilitation: Provide vocational rehab assistance to qualifying candidates.   Vocational Rehab Evaluation & Intervention:  Vocational Rehab - 08/01/24 1355       Initial Vocational Rehab Evaluation & Intervention   Assessment shows need for Vocational Rehabilitation No      Vocational Rehab Re-Evaulation   Comments Patient is retired.          Education: Education Goals: Education classes will be provided on a variety of topics geared toward better understanding of heart health and risk factor modification. Participant will state understanding/return demonstration of topics presented as noted by education test scores.  Learning Barriers/Preferences:  Learning Barriers/Preferences - 08/01/24 1355       Learning Barriers/Preferences   Learning Barriers --   Family reports patient is having memory issues and is being screened for AD.   Learning Preferences Skilled Demonstration          General Cardiac Education  Topics:  AED/CPR: - Group verbal and written instruction with the use of models to demonstrate the basic use of the AED with the basic ABC's of resuscitation.   Test and Procedures: - Group verbal and visual presentation and models provide information about basic cardiac anatomy and function. Reviews the testing methods done to diagnose heart disease and the outcomes of the test results. Describes the treatment choices: Medical Management, Angioplasty, or Coronary Bypass Surgery for treating various heart conditions including Myocardial Infarction, Angina, Valve Disease, and Cardiac Arrhythmias. Written material provided at class time.   Medication Safety: - Group verbal and visual instruction to review commonly prescribed medications for heart and lung disease. Reviews the medication, class of the drug, and side effects. Includes the steps to properly store meds and maintain the prescription regimen. Written material provided at class time.   Intimacy: - Group verbal instruction through game format to discuss how heart and lung disease can affect sexual intimacy. Written material provided at class time.   Know Your Numbers and Heart Failure: - Group verbal and visual instruction to discuss disease risk factors for cardiac and pulmonary disease and treatment options.  Reviews associated critical values for Overweight/Obesity, Hypertension, Cholesterol, and Diabetes.  Discusses basics of heart failure: signs/symptoms and treatments.  Introduces Heart Failure Zone chart for action plan for heart failure. Written material provided at class time.   Infection Prevention: - Provides verbal and written material to individual with discussion of infection control including proper hand washing and proper equipment cleaning during exercise session.   Falls Prevention: - Provides verbal and written material to individual with discussion of falls prevention and safety.   Other: -Provides group and  verbal instruction on various topics (see comments)   Knowledge Questionnaire Score:   Core Components/Risk Factors/Patient Goals at Admission:  Personal Goals and Risk Factors at Admission - 08/01/24 1356       Core Components/Risk Factors/Patient Goals on Admission    Weight Management Weight Maintenance    Improve shortness of breath with ADL's Yes    Intervention Provide education, individualized exercise plan and daily activity instruction to help decrease symptoms of SOB with activities of daily living.    Expected Outcomes Short Term: Improve cardiorespiratory fitness to achieve a reduction of symptoms when performing ADLs;Long Term: Be able to perform more ADLs without symptoms or delay the onset of symptoms    Diabetes Yes    Intervention Provide education about signs/symptoms and action to take for hypo/hyperglycemia.;Provide education about proper nutrition, including hydration, and aerobic/resistive exercise prescription  along with prescribed medications to achieve blood glucose in normal ranges: Fasting glucose 65-99 mg/dL    Expected Outcomes Short Term: Participant verbalizes understanding of the signs/symptoms and immediate care of hyper/hypoglycemia, proper foot care and importance of medication, aerobic/resistive exercise and nutrition plan for blood glucose control.;Long Term: Attainment of HbA1C < 7%.    Hypertension Yes    Intervention Monitor prescription use compliance.;Provide education on lifestyle modifcations including regular physical activity/exercise, weight management, moderate sodium restriction and increased consumption of fresh fruit, vegetables, and low fat dairy, alcohol moderation, and smoking cessation.    Expected Outcomes Short Term: Continued assessment and intervention until BP is < 140/27mm HG in hypertensive participants. < 130/35mm HG in hypertensive participants with diabetes, heart failure or chronic kidney disease.;Long Term: Maintenance of blood  pressure at goal levels.    Lipids Yes    Intervention Provide education and support for participant on nutrition & aerobic/resistive exercise along with prescribed medications to achieve LDL 70mg , HDL >40mg .    Expected Outcomes Short Term: Participant states understanding of desired cholesterol values and is compliant with medications prescribed. Participant is following exercise prescription and nutrition guidelines.;Long Term: Cholesterol controlled with medications as prescribed, with individualized exercise RX and with personalized nutrition plan. Value goals: LDL < 70mg , HDL > 40 mg.          Education:Diabetes - Individual verbal and written instruction to review signs/symptoms of diabetes, desired ranges of glucose level fasting, after meals and with exercise. Acknowledge that pre and post exercise glucose checks will be done for 3 sessions at entry of program.   Core Components/Risk Factors/Patient Goals Review:    Core Components/Risk Factors/Patient Goals at Discharge (Final Review):    ITP Comments:  ITP Comments     Row Name 08/01/24 1417 08/06/24 1513         ITP Comments Virtual orientation visit completed for cardiac rehab with S/P TAVR. On-site orientation visit scheduled for 08/06/24 at 2:30. Patient attend orientation today.  Patient is attending Cardiac Rehabilitation Program.  Documentation for diagnosis can be found in [facility].  Reviewed medical chart, RPE/RPD, gym safety, and program guidelines.  Patient was fitted to equipment they will be using during rehab.  Patient is scheduled to start exercise on 08/07/24.   Initial ITP created and sent for review and signature by Dr. Dorn Ross, Medical Director for Cardiac Rehabilitation Program.         Comments:  Initial ITP.

## 2024-08-11 ENCOUNTER — Emergency Department (HOSPITAL_COMMUNITY)
Admission: EM | Admit: 2024-08-11 | Discharge: 2024-08-11 | Disposition: A | Discharge status: Home / Self Care | Source: Ambulatory Visit

## 2024-08-11 ENCOUNTER — Encounter (HOSPITAL_COMMUNITY): Payer: Self-pay

## 2024-08-11 ENCOUNTER — Ambulatory Visit: Payer: Self-pay | Admitting: Internal Medicine

## 2024-08-11 ENCOUNTER — Emergency Department (HOSPITAL_COMMUNITY)

## 2024-08-11 ENCOUNTER — Other Ambulatory Visit: Payer: Self-pay

## 2024-08-11 ENCOUNTER — Telehealth: Payer: Self-pay | Admitting: Internal Medicine

## 2024-08-11 DIAGNOSIS — I509 Heart failure, unspecified: Secondary | ICD-10-CM | POA: Diagnosis not present

## 2024-08-11 DIAGNOSIS — I110 Hypertensive heart disease with heart failure: Secondary | ICD-10-CM | POA: Diagnosis not present

## 2024-08-11 DIAGNOSIS — R918 Other nonspecific abnormal finding of lung field: Secondary | ICD-10-CM | POA: Diagnosis not present

## 2024-08-11 DIAGNOSIS — Z950 Presence of cardiac pacemaker: Secondary | ICD-10-CM | POA: Diagnosis not present

## 2024-08-11 DIAGNOSIS — Z7901 Long term (current) use of anticoagulants: Secondary | ICD-10-CM | POA: Diagnosis not present

## 2024-08-11 DIAGNOSIS — R0989 Other specified symptoms and signs involving the circulatory and respiratory systems: Secondary | ICD-10-CM | POA: Diagnosis not present

## 2024-08-11 DIAGNOSIS — R600 Localized edema: Secondary | ICD-10-CM

## 2024-08-11 DIAGNOSIS — F0390 Unspecified dementia without behavioral disturbance: Secondary | ICD-10-CM | POA: Diagnosis not present

## 2024-08-11 DIAGNOSIS — R9431 Abnormal electrocardiogram [ECG] [EKG]: Secondary | ICD-10-CM | POA: Diagnosis not present

## 2024-08-11 DIAGNOSIS — R0602 Shortness of breath: Secondary | ICD-10-CM | POA: Diagnosis not present

## 2024-08-11 LAB — CBC WITH DIFFERENTIAL/PLATELET
Abs Immature Granulocytes: 0.03 K/uL (ref 0.00–0.07)
Basophils Absolute: 0.1 K/uL (ref 0.0–0.1)
Basophils Relative: 1 %
Eosinophils Absolute: 0.3 K/uL (ref 0.0–0.5)
Eosinophils Relative: 5 %
HCT: 35.3 % — ABNORMAL LOW (ref 39.0–52.0)
Hemoglobin: 11.5 g/dL — ABNORMAL LOW (ref 13.0–17.0)
Immature Granulocytes: 0 %
Lymphocytes Relative: 16 %
Lymphs Abs: 1.1 K/uL (ref 0.7–4.0)
MCH: 30.9 pg (ref 26.0–34.0)
MCHC: 32.6 g/dL (ref 30.0–36.0)
MCV: 94.9 fL (ref 80.0–100.0)
Monocytes Absolute: 0.8 K/uL (ref 0.1–1.0)
Monocytes Relative: 13 %
Neutro Abs: 4.4 K/uL (ref 1.7–7.7)
Neutrophils Relative %: 65 %
Platelets: 160 K/uL (ref 150–400)
RBC: 3.72 MIL/uL — ABNORMAL LOW (ref 4.22–5.81)
RDW: 14.9 % (ref 11.5–15.5)
WBC: 6.7 K/uL (ref 4.0–10.5)
nRBC: 0 % (ref 0.0–0.2)

## 2024-08-11 LAB — PRO BRAIN NATRIURETIC PEPTIDE: Pro Brain Natriuretic Peptide: 3224 pg/mL — ABNORMAL HIGH (ref <300.0)

## 2024-08-11 LAB — URINALYSIS, ROUTINE W REFLEX MICROSCOPIC
Bilirubin Urine: NEGATIVE
Glucose, UA: NEGATIVE mg/dL
Hgb urine dipstick: NEGATIVE
Ketones, ur: NEGATIVE mg/dL
Leukocytes,Ua: NEGATIVE
Nitrite: NEGATIVE
Protein, ur: NEGATIVE mg/dL
Specific Gravity, Urine: 1.012 (ref 1.005–1.030)
pH: 6 (ref 5.0–8.0)

## 2024-08-11 LAB — COMPREHENSIVE METABOLIC PANEL WITH GFR
ALT: 27 U/L (ref 0–44)
AST: 37 U/L (ref 15–41)
Albumin: 4.2 g/dL (ref 3.5–5.0)
Alkaline Phosphatase: 136 U/L — ABNORMAL HIGH (ref 38–126)
Anion gap: 9 (ref 5–15)
BUN: 25 mg/dL — ABNORMAL HIGH (ref 8–23)
CO2: 30 mmol/L (ref 22–32)
Calcium: 9.2 mg/dL (ref 8.9–10.3)
Chloride: 104 mmol/L (ref 98–111)
Creatinine, Ser: 1.81 mg/dL — ABNORMAL HIGH (ref 0.61–1.24)
GFR, Estimated: 39 mL/min — ABNORMAL LOW (ref >60)
Glucose, Bld: 95 mg/dL (ref 70–99)
Potassium: 4.4 mmol/L (ref 3.5–5.1)
Sodium: 143 mmol/L (ref 135–145)
Total Bilirubin: 0.7 mg/dL (ref 0.0–1.2)
Total Protein: 6.6 g/dL (ref 6.5–8.1)

## 2024-08-11 LAB — TROPONIN T, HIGH SENSITIVITY
Troponin T High Sensitivity: 61 ng/L — ABNORMAL HIGH (ref 0–19)
Troponin T High Sensitivity: 62 ng/L — ABNORMAL HIGH (ref 0–19)

## 2024-08-11 MED ORDER — IPRATROPIUM-ALBUTEROL 0.5-2.5 (3) MG/3ML IN SOLN
3.0000 mL | Freq: Once | RESPIRATORY_TRACT | Status: AC
  Administered 2024-08-11: 3 mL via RESPIRATORY_TRACT
  Filled 2024-08-11: qty 3

## 2024-08-11 MED ORDER — FUROSEMIDE 10 MG/ML IJ SOLN
60.0000 mg | Freq: Once | INTRAMUSCULAR | Status: AC
  Administered 2024-08-11: 60 mg via INTRAVENOUS
  Filled 2024-08-11: qty 6

## 2024-08-11 MED ORDER — FUROSEMIDE 20 MG PO TABS: 40.0000 mg | ORAL_TABLET | Freq: Two times a day (BID) | ORAL | 0 refills | Status: DC

## 2024-08-11 NOTE — Telephone Encounter (Signed)
 E2C2 Pulmonary Triage - Initial Assessment Questions "Chief Complaint (e.g., cough, sob, wheezing, fever, chills, sweat or additional symptoms) *Go to specific symptom protocol after initial questions. SOB  "How long have symptoms been present?" 1 month ago   Have you tested for COVID or Flu? Note: If not, ask patient if a home test can be taken. If so, instruct patient to call back for positive results. No  MEDICINES:   "Have you used any OTC meds to help with symptoms?" No If yes, ask "What medications?" -  "Have you used your inhalers/maintenance medication?" Yes If yes, "What medications?" Symbicort    If inhaler, ask "How many puffs and how often?" Note: Review instructions on medication in the chart. 2 puffs daily   OXYGEN: "Do you wear supplemental oxygen?" No If yes, "How many liters are you supposed to use?" [name]  "Do you monitor your oxygen levels?" Yes If yes, What is your reading (oxygen level) today? 90 or above usually   What is your usual oxygen saturation reading?  (Note: Pulmonary O2 sats should be 90% or greater) 90 or above    FYI Only or Action Required?: FYI only for provider: appointment scheduled on 08/12/2024.  Patient was last seen in primary care on unknown.  Called Nurse Triage reporting Shortness of Breath.  Symptoms began about a month ago.  Interventions attempted: Prescription medications: Symbicort .  Symptoms are: gradually worsening.  Triage Disposition: See Physician Within 24 Hours (overriding See PCP When Office is Open (Within 3 Days))  Patient/caregiver understands and will follow disposition?: Yes    Copied from CRM #[id number]. Topic: Clinical - Red Word Triage >> Aug 11, 2024  8:28 AM Corean SAUNDERS wrote: Red Word that prompted transfer to Nurse Triage: Symbicort  not working after aortic valve replacement and pacemaker implantation. Patient is having trouble breathing and is very nervous.   Daughter Mliss speaking. Reason  for Disposition  [1] MODERATE longstanding difficulty breathing (e.g., speaks in phrases, SOB even at rest, pulse 100-120) AND [2] SAME as normal  Answer Assessment - Initial Assessment Questions Abrio used while in hospital. Symicort not working for symptoms currently.   1. RESPIRATORY STATUS: Describe your breathing? (e.g., wheezing, shortness of breath, unable to speak, severe coughing)      Patient has difficultly walking to and from the bathroom without struggling to breath  2. ONSET: When did this breathing problem begin?      About a month ago  3. PATTERN Does the difficult breathing come and go, or has it been constant since it started?      Constant  4. SEVERITY: How bad is your breathing? (e.g., mild, moderate, severe)      Severe      5. CARDIAC HISTORY: Do you have any history of heart disease? (e.g., heart attack, angina, bypass surgery, angioplasty)      Hx of heart surgeries  6. LUNG HISTORY: Do you have any history of lung disease?  (e.g., pulmonary embolus, asthma, emphysema)     Had a cpap in the past, hx of closure of bronchial tubes  7. CAUSE: What do you think is causing the breathing problem?      Aortic valve replacement  8. OTHER SYMPTOMS: Do you have any other symptoms? (e.g., chest pain, cough, dizziness, fever, runny nose)     Altered mental status, anxiety, fluids on legs  9. O2 SATURATION MONITOR:  Do you use an oxygen saturation monitor (pulse oximeter) at home? If Yes, ask: What is your reading (  oxygen level) today? What is your usual oxygen saturation reading? (e.g., 95%)       90 or above usually  Protocols used: Breathing Difficulty-A-AH

## 2024-08-11 NOTE — Discharge Instructions (Addendum)
 Please start taking the Lasix  2 times daily for the next 5 days.  Utilize compression stockings as he will tolerate.  Return to emergency department immediately for any new or worsening symptoms.  Follow-up closely with primary care doctor.

## 2024-08-11 NOTE — Telephone Encounter (Signed)
 Spoke to pt's daughter who stated that pt has tightness in his legs with pitting edema,itching, and weeping. Pts daughter stated that pt has scratched his legs so much that he has taken the skin off of his legs. Pt's daughter has legs wrapped but stated she has to change the wrappings multiple times per day d/t the wrappings becoming wet/soggy.  Pt's daughter stated pt cannot elevate his legs long d/t breathing issues. Pt's daughter also stated that pt has had an acute change in mental status, where he is confused all day long- previously it was sundowners. Pt's daughter stated that pt is currently taking Lasix  40 mg daily, but it does not seem to help anymore. Daughter provided weights from this past weekend: 10/31- 212.4 lb 11/1- 213.8 lb 11/2-213.4 lb 11/3- 211.6 lb   Pt has S/P TAVR on 07/08/24. Will route to general cardiology and structural heart to advise

## 2024-08-11 NOTE — Telephone Encounter (Signed)
 Spoke to daughter who verbalized understanding and will take pt to ED for evaluation.

## 2024-08-11 NOTE — Telephone Encounter (Signed)
 Pt c/o swelling/edema: STAT if pt has developed SOB within 24 hours  If swelling, where is the swelling located? Legs   How much weight have you gained and in what time span? yes  Have you gained 2 pounds in a day or 5 pounds in a week? 5 or less per daughter  Do you have a log of your daily weights (if so, list)? Yes   Are you currently taking a fluid pill? yes  Are you currently SOB? no  Have you traveled recently in a car or plane for an extended period of time? No   Pt's daughter would like a c/b from a nurse to discuss condition further in detail please advise

## 2024-08-11 NOTE — ED Triage Notes (Signed)
 Pt arrived via POV with family who report Pt has been experiencing on-going SOB, and reports the Pt has been showing signs of Sundowning and confusion. Pts family also report Pt has had recent visual hallucinations and swelling in his legs.

## 2024-08-11 NOTE — ED Provider Notes (Signed)
 [place] EMERGENCY DEPARTMENT AT [facility] Provider Note   CSN: [id number] Arrival date & time: 08/11/24  1441     Patient presents with: Altered Mental Status   Ryan [name] is a 73 y.o. male.   Patient is a 73 year old male who presents emergency department with his wife and daughter secondary to increased swelling to lower extremities, intermittent shortness of breath, and increased confusion.  Family notes that he does have a history of CHF and dementia.  They note that he does have follow-up with his primary care doctor tomorrow.  They were concerned given his increased edema to his lower extremities and his shortness of breath.  He is on 40 mg of Lasix  daily.  He did recently have TAVR performed.  Patient denies any associated chest pain, abdominal pain.  There has been no nausea, vomiting, diarrhea.  He has had no recent falls or blunt head trauma.   Altered Mental Status      Prior to Admission medications   Medication Sig Start Date End Date Taking? Authorizing Provider  acetaminophen  (TYLENOL ) 500 MG tablet Take 500 mg by mouth every 6 (six) hours as needed for mild pain.     [provider]  albuterol  (VENTOLIN  HFA) 108 (90 Base) MCG/ACT inhaler Inhale 2 puffs into the lungs as needed for wheezing or shortness of breath. 01/29/24   [name], MD  ALPRAZolam  (XANAX ) 1 MG tablet Take 1 mg by mouth at bedtime.    [provider]  apixaban (ELIQUIS) 5 MG TABS tablet Take 1 tablet (5 mg total) by mouth 2 (two) times daily. 07/17/24   Strader, Brittany M, PA-C  Artificial Tear Solution (SOOTHE XP OP) Place 1 drop into both eyes daily as needed (dry eyes).    [provider]  busPIRone (BUSPAR) 5 MG tablet Take 5 mg by mouth daily. 05/06/24   [provider]  Cholecalciferol (VITAMIN D3) 50 MCG (2000 UT) capsule Take 2,000 Units by mouth daily.    [provider]  citalopram  (CELEXA ) 20 MG tablet Take 40 mg by mouth  every evening. 11/28/23   [provider]  docusate sodium  (COLACE) 100 MG capsule Take 100 mg by mouth 2 (two) times a week.    [provider]  famotidine  (PEPCID ) 20 MG tablet One after supper 04/16/24   [name], MD  folic acid  (FOLVITE ) 400 MCG tablet Take 400 mcg by mouth daily.    [provider]  furosemide  (LASIX ) 20 MG tablet Take 1 tablet (20 mg total) by mouth as needed for edema or fluid. Patient taking differently: Take 40 mg by mouth daily. 07/15/24   [name], PA-C  Homeopathic Products (RESTFUL LEGS SL) Place 1 tablet under the tongue at bedtime.    [provider]  levothyroxine  (SYNTHROID ) 75 MCG tablet Take 75 mcg by mouth at bedtime. 10/31/23   [provider]  Magnesium  500 MG TABS Take 500 mg by mouth 3 (three) times a week.    [provider]  metoprolol  succinate (TOPROL -XL) 25 MG 24 hr tablet Take 0.5 tablets (12.5 mg total) by mouth 2 (two) times daily. 07/13/24   Strader, [name], PA-C  nitroGLYCERIN  (NITROSTAT ) 0.4 MG SL tablet Place 1 tablet (0.4 mg total) under the tongue every 5 (five) minutes as needed for chest pain. 12/04/19   [name], MD  OVER THE COUNTER MEDICATION Take 2 tablets by mouth daily. Ultra kidney complex  [provider]  OVER THE COUNTER MEDICATION Take 1 tablet by mouth 3 (three) times a week. Herbal laxative otc supplement    [provider]  pantoprazole  (PROTONIX ) 40 MG tablet Take 1 tablet (40 mg total) by mouth daily. Take 30-60 min before first meal of the day 04/16/24   [name], MD  pramipexole (MIRAPEX) 0.5 MG tablet Take 0.5 mg by mouth daily. 12/02/23   [provider]  simvastatin  (ZOCOR ) 80 MG tablet Take 80 mg by mouth every evening.  10/13/19   [provider]  SYMBICORT  80-4.5 MCG/ACT inhaler Inhale 2 puffs into the lungs 2 (two) times daily. 02/21/24   [provider]  vitamin B-12 (CYANOCOBALAMIN ) 500 MCG  tablet Take 500 mcg by mouth daily.    [provider]    Allergies: Patient has no known allergies.    Review of Systems  Cardiovascular:  Positive for leg swelling.  All other systems reviewed and are negative.   Updated Vital Signs BP (!) 156/72 (BP Location: Left Arm)   Pulse 67   Temp (!) 97.2 F (36.2 C) (Temporal)   Resp 17   Ht 6' 3 (1.905 m)   Wt 97 kg   SpO2 97%   BMI 26.73 kg/m   Physical Exam Vitals and nursing note reviewed.  Constitutional:      General: He is not in acute distress.    Appearance: Normal appearance. He is not ill-appearing.  HENT:     Head: Normocephalic and atraumatic.     Nose: Nose normal.     Mouth/Throat:     Mouth: Mucous membranes are moist.  Eyes:     Extraocular Movements: Extraocular movements intact.     Conjunctiva/sclera: Conjunctivae normal.     Pupils: Pupils are equal, round, and reactive to light.  Cardiovascular:     Rate and Rhythm: Normal rate and regular rhythm.     Pulses: Normal pulses.     Heart sounds: Normal heart sounds. No murmur heard.    No gallop.  Pulmonary:     Effort: Pulmonary effort is normal. No respiratory distress.     Breath sounds: No stridor. Wheezing present. No rhonchi or rales.  Abdominal:     General: Abdomen is flat. Bowel sounds are normal. There is no distension.     Palpations: Abdomen is soft.     Tenderness: There is no abdominal tenderness. There is no guarding.  Musculoskeletal:        General: Normal range of motion.     Cervical back: Normal range of motion and neck supple.     Comments: 3+ pitting edema noted to bilateral lower extremities, small skin tear noted to left lower extremity, no overlying erythema, warmth, induration or fluctuance  Skin:    General: Skin is warm and dry.     Findings: No rash.  Neurological:     General: No focal deficit present.     Mental Status: He is alert and oriented to person, place, and time. Mental status is at baseline.   Psychiatric:        Mood and Affect: Mood normal.        Behavior: Behavior normal.        Thought Content: Thought content normal.        Judgment: Judgment normal.     (all labs ordered are listed, but only abnormal results are displayed) Labs Reviewed  CBC WITH DIFFERENTIAL/PLATELET - Abnormal; Notable for the following components:  Result Value   RBC 3.72 (*)    Hemoglobin 11.5 (*)    HCT 35.3 (*)    All other components within normal limits  COMPREHENSIVE METABOLIC PANEL WITH GFR - Abnormal; Notable for the following components:   BUN 25 (*)    Creatinine, Ser 1.81 (*)    Alkaline Phosphatase 136 (*)    GFR, Estimated 39 (*)    All other components within normal limits  PRO BRAIN NATRIURETIC PEPTIDE - Abnormal; Notable for the following components:   Pro Brain Natriuretic Peptide 3,224.0 (*)    All other components within normal limits  TROPONIN T, HIGH SENSITIVITY - Abnormal; Notable for the following components:   Troponin T High Sensitivity 61 (*)    All other components within normal limits  URINALYSIS, ROUTINE W REFLEX MICROSCOPIC  TROPONIN T, HIGH SENSITIVITY    EKG: None  Radiology: DG Chest 2 View Result Date: 08/11/2024 EXAM: 2 VIEW(S) XRAY OF THE CHEST 08/11/2024 03:15:14 PM COMPARISON: None available. CLINICAL HISTORY: SOB FINDINGS: LINES, TUBES AND DEVICES: Left chest wall newly pacemaker. LUNGS AND PLEURA: Low lung volumes. No focal pulmonary opacity. No pulmonary edema. Likely trace left pleural effusion. No pneumothorax. HEART AND MEDIASTINUM: Aortic valve replacement. Atherosclerotic plaque. No acute abnormality of the cardiac and mediastinal silhouettes. BONES AND SOFT TISSUES: Sternotomy wires are intact. Left chest wall newly pacemaker. No acute osseous abnormality. IMPRESSION: 1. Low lung volumes with no acute cardiopulmonary process. 2. Likely trace left pleural effusion. Electronically signed by: Morgane Naveau MD 08/11/2024 03:54 PM EST RP  Workstation: [id number]     Procedures   Medications Ordered in the ED  furosemide  (LASIX ) injection 60 mg (has no administration in time range)  ipratropium-albuterol  (DUONEB) 0.5-2.5 (3) MG/3ML nebulizer solution 3 mL (has no administration in time range)                                    Medical Decision Making Amount and/or Complexity of Data Reviewed Labs: ordered. Radiology: ordered.  Risk Prescription drug management.   This patient presents to the ED for concern of edema differential diagnosis includes CHF, DVT, cellulitis, acute renal failure, electrolyte derangement, ACS, urinary tract infection    Additional history obtained:  Additional history obtained from family  external records from outside source obtained and reviewed including medical records  Lab Tests:  I Ordered, and personally interpreted labs.  The pertinent results include:   no leukocytosis, anemia at baseline, mild elevation of creatinine from baseline, normal electrolytes and liver function, unremarkable urinalysis, stable serial troponins, elevated BNP   Imaging Studies ordered:  I ordered imaging studies including chest x-ray I independently visualized and interpreted imaging which showed no acute cardiopulmonary process I agree with the radiologist interpretation   Medicines ordered and prescription drug management:  I ordered medication including Lasix , DuoNeb for this of breath and peripheral edema Reevaluation of the patient after these medicines showed that the patient improved I have reviewed the patients home medicines and have made adjustments as needed   Problem List / ED Course:  Patient is doing very well at this time and is stable for discharge home.  Discussed with family that we will increase his Lasix  over the next 5 days and recommend close follow-up with primary care doctor for reevaluation.  Patient notes that he is ready for discharge home at this time.  Do suspect  that his confusion is secondary  to his dementia.  There was no indication for urinary tract infection.  No other acute infectious source was noted.  The need for close follow-up with primary care doctor was discussed as well as strict turn precautions for any new or worsening symptoms.  Family voiced understand to the plan and had no additional questions.  Patient was fully evaluated by attending physician who is in agreement to plan at this time.   Social Determinants of Health:          Final diagnoses:  None    ED Discharge Orders     None          Daralene Lonni JONETTA DEVONNA 08/11/24 1957    Simon Lavonia SAILOR, MD 08/11/24 2233

## 2024-08-12 ENCOUNTER — Encounter (HOSPITAL_COMMUNITY)
Admission: RE | Admit: 2024-08-12 | Discharge: 2024-08-12 | Disposition: A | Discharge status: Home / Self Care | Source: Ambulatory Visit | Attending: Internal Medicine | Admitting: Internal Medicine

## 2024-08-12 ENCOUNTER — Ambulatory Visit (INDEPENDENT_AMBULATORY_CARE_PROVIDER_SITE_OTHER): Admitting: Internal Medicine

## 2024-08-12 ENCOUNTER — Encounter: Payer: Self-pay | Admitting: Internal Medicine

## 2024-08-12 VITALS — BP 102/44 | HR 72 | Ht 75.0 in | Wt 209.0 lb

## 2024-08-12 DIAGNOSIS — R0609 Other forms of dyspnea: Secondary | ICD-10-CM | POA: Diagnosis not present

## 2024-08-12 DIAGNOSIS — E119 Type 2 diabetes mellitus without complications: Secondary | ICD-10-CM | POA: Diagnosis not present

## 2024-08-12 DIAGNOSIS — I519 Heart disease, unspecified: Secondary | ICD-10-CM | POA: Diagnosis not present

## 2024-08-12 DIAGNOSIS — R413 Other amnesia: Secondary | ICD-10-CM | POA: Diagnosis not present

## 2024-08-12 DIAGNOSIS — Z953 Presence of xenogenic heart valve: Secondary | ICD-10-CM | POA: Diagnosis not present

## 2024-08-12 NOTE — Patient Instructions (Addendum)
 If trouble lying down in bed please use your recliner   Symbicort  80 Take 2 puffs first thing in am and then another 2 puffs about 12 hours later.   Work on inhaler technique:  relax and gently blow all the way out then take a nice smooth full deep breath back in, triggering the inhaler a split before you start breathing in when you are using the spacer.  Hold breath in for at least  5 seconds if you can. Blow out sybmbicort  thru nose. Rinse and gargle with water  when done.  If mouth or throat bother you at all,  try brushing teeth/gums/tongue with arm and hammer toothpaste/ make a slurry and gargle and spit out.    Keep the follow up appt in December - call sooner if needed

## 2024-08-12 NOTE — Progress Notes (Signed)
 Ryan [name], male    DOB: 11-09-1950    MRN: [id number]   Brief patient profile:  73  yowm  quit smoking 1983 at dx of [place] Dz self-  referred to pulmonary clinic in [place]  12/19/2023 for doe.   S/p R breast ca 2012 > tamoxifen / no RT   S/p CABG  2014 > rehab 90% > [facility] >  [facility]   PFTs  03/12/15  no obst    History of Present Illness  12/19/2023  Pulmonary/ 1st office eval/ Ryan [name] / [place] [facility]  Chief Complaint  Patient presents with   Consult    Self referral for SOB    Dyspnea:  walking to shop is slt incline back to house maybe 100 ft stops at  least once or twice  Cough: some am green x months  Sleep: starts in bed but then recliner x one year  x cpap  SABA use: none  02: none / does not check with  Sob once a day at rest 5 -15 min   Rec GERD diet reviewed, bed blocks rec  Make sure you check your oxygen saturation at your highest level of activity(NOT after you stop)  to be sure it stays over 90%   Stiolto 2 puffs each am trial basis - call for prescription if better walking to shop and back Augmentin  875 mg take one pill twice daily  X 10 days   Please schedule a follow up office visit in 2 weeks, sooner if needed   - Allergy screen 12/19/23  >  Eos 0.3/  IgE  184 - Sniff 01/02/2024   R phrenic nerve palsy     01/03/2024  f/u ov/[name] office/Ryan [name] re: doe /cough  maint on stiolto  / gerd rx  Chief Complaint  Patient presents with   Follow-up    DOE  and discuss results of his PFT , 2 week follow up   Dyspnea:  it's easier to walk  Cough: much less now  Sleeping: still in recliner 45 degrees no cpap  SABA use: none  02: none  Rec See your cardiologist - no immediate concern but you need follow up  Ok to try off acid suppression after a full month to see what if any symptoms recur but cough / short of breath recur  and if so resume it.  Ok to try off stiolto but if worse breathing restart    PFT's  02/07/24  FEV1 1.80 (47 % ) ratio 0.70  p 36  % improvement from saba p 0 prior to study with DLCO  12.36 (42% %)   and FV curve classically concave      04/10/2024  f/u ov/[place] office/Ryan [name] re: AB/ R phrenic nerve paralysis  maint on symbicort  80   Chief Complaint  Patient presents with   Follow-up    DOE   Dyspnea:  house to shop 100 ft downhill and back s stopping now (improved)  Cough: worse when sleeps in bed vs recliner, non productive  SABA use: rarely  02: none  Rec Work on inhaler technique:   SABRA  Make sure you check your oxygen saturation at your highest level of activity(NOT after you stop)  to be sure it stays over 90%  Please schedule a follow up visit in 6  months but call sooner if needed  Add:  did not realize he had stopped PPI ? Related to new upper airway symptoms > if not better with spacer rec  Pantoprazole  (protonix ) 40 mg   Take  30-60 min before first meal of the day and Pepcid  (famotidine )  20 mg after supper x 6 week trial then ENT eval if not better    Prolonged admit for TAVR/ pacemaker complicated by fluid overload/ delirium    08/12/2024   ACUTE ov/[place] office/Ryan [name] re: AB/R phrenic nerve paralysis  maint on Symbicort   80  with spacer but not using it  Chief Complaint  Patient presents with   Acute Visit    Shob / in hsp gave him breo instead of Symbicort  now causing problems hard to catch up  Dyspnea:  improving since d/c along with leg swelling  Cough: none but voice quite raspy  Sleeping: able to lie flatter p diuresis in ER one day prior to OV  s  resp cc  SABA use: albuterol  none  on day of ov  02: none    No obvious day to day or daytime variability or assoc excess/ purulent sputum or mucus plugs or hemoptysis or cp or chest tightness, subjective wheeze or overt  hb symptoms.    Also denies any obvious fluctuation of symptoms with weather or environmental changes or other aggravating or alleviating factors except as outlined above   No unusual exposure hx or h/o childhood pna/  asthma or knowledge of premature birth.  Current Allergies, Complete Past Medical History, Past Surgical History, Family History, and Social History were reviewed in [name].  ROS  The following are not active complaints unless bolded Hoarseness, sore throat, dysphagia, dental problems, itching, sneezing,  nasal congestion or discharge of excess mucus or purulent secretions, ear ache,   fever, chills, sweats, unintended wt loss or wt gain, classically pleuritic or exertional cp,  orthopnea pnd or arm/hand swelling  or leg swelling, presyncope, palpitations, abdominal pain, anorexia, nausea, vomiting, diarrhea  or change in bowel habits or change in bladder habits, change in stools or change in urine, dysuria, hematuria,  rash, arthralgias, visual complaints, headache, numbness, weakness or ataxia or problems with walking or coordination,  change in mood or  memory (s/p hosp assoc delirium)         Current Meds  Medication Sig   acetaminophen  (TYLENOL ) 500 MG tablet Take 500 mg by mouth every 6 (six) hours as needed for mild pain.    albuterol  (VENTOLIN  HFA) 108 (90 Base) MCG/ACT inhaler Inhale 2 puffs into the lungs as needed for wheezing or shortness of breath.   ALPRAZolam  (XANAX ) 1 MG tablet Take 1 mg by mouth at bedtime.   apixaban (ELIQUIS) 5 MG TABS tablet Take 1 tablet (5 mg total) by mouth 2 (two) times daily.   Artificial Tear Solution (SOOTHE XP OP) Place 1 drop into both eyes daily as needed (dry eyes).   busPIRone (BUSPAR) 5 MG tablet Take 5 mg by mouth daily.   Cholecalciferol (VITAMIN D3) 50 MCG (2000 UT) capsule Take 2,000 Units by mouth daily.   citalopram  (CELEXA ) 20 MG tablet Take 40 mg by mouth every evening.   docusate sodium  (COLACE) 100 MG capsule Take 100 mg by mouth 2 (two) times a week.   famotidine  (PEPCID ) 20 MG tablet One after supper   folic acid  (FOLVITE ) 400 MCG tablet Take 400 mcg by mouth daily.   furosemide  (LASIX ) 20 MG tablet  Take 2 tablets (40 mg total) by mouth 2 (two) times daily for 5 days. (Patient taking differently: Take 80 mg by mouth 2 (two) times daily.)  Homeopathic Products (RESTFUL LEGS SL) Place 1 tablet under the tongue at bedtime.   levothyroxine  (SYNTHROID ) 75 MCG tablet Take 75 mcg by mouth at bedtime.   Magnesium  500 MG TABS Take 500 mg by mouth 3 (three) times a week.   metoprolol  succinate (TOPROL -XL) 25 MG 24 hr tablet Take 0.5 tablets (12.5 mg total) by mouth 2 (two) times daily.   nitroGLYCERIN  (NITROSTAT ) 0.4 MG SL tablet Place 1 tablet (0.4 mg total) under the tongue every 5 (five) minutes as needed for chest pain.   OVER THE COUNTER MEDICATION Take 2 tablets by mouth daily. Ultra kidney complex   OVER THE COUNTER MEDICATION Take 1 tablet by mouth 3 (three) times a week. Herbal laxative otc supplement   pantoprazole  (PROTONIX ) 40 MG tablet Take 1 tablet (40 mg total) by mouth daily. Take 30-60 min before first meal of the day   pramipexole (MIRAPEX) 0.5 MG tablet Take 0.5 mg by mouth daily.   simvastatin  (ZOCOR ) 80 MG tablet Take 80 mg by mouth every evening.    SYMBICORT  80-4.5 MCG/ACT inhaler Inhale 2 puffs into the lungs 2 (two) times daily.   vitamin B-12 (CYANOCOBALAMIN ) 500 MCG tablet Take 500 mcg by mouth daily.             Past Medical History:  Diagnosis Date   Adie's pupil    Anemia 07/14/2013   Borderline diabetes    CAD (coronary artery disease), native coronary artery 04/09/2013   Dist LM stenosis 70-80% (at trifurcation into LAD, CX, & 2 Ramus branches, 70% mid RCA);; b) Myoview  08/06/14: Low Risk, no Ischemia/infarction   Depression 1983   after I was dx'd w/CA; now just comes in spells  (12/22/2015)   Essential hypertension    Exertional shortness of breath    Hodgkin's lymphoma (HCC) 1983   Hypercholesterolemia    Hypothyroidism    Invasive ductal carcinoma of breast, stage 2 (HCC) 04/27/2011   chemo, mastectomy   Left carotid artery partial occlusion  04/27/2011   CAROTIC DOPPLER 11/2013: < 40% R ICA stenosis, distal waveforms damped - suggests probably intracranial occlusoin, < 40% LICA, normal vertebrals -- no change   Mild aortic stenosis by prior echocardiogram 02/2018   By echo June 2020: Mild-Mod AS (Mean Gradient 13.5 mmHg)   Mild mitral stenosis by prior echocardiogram 08/06/2014   Most recent echo June 2020: Estimated valve area 2.83 cm (in 2019 was roughly 2 cm).  Mean gradient 6-8 mmHg.  Appears to be stable.   Personal history of chemotherapy    Pre-diabetes    Restless leg syndrome    Right carotid artery occlusion 04/27/2011   S/P CABG x 3 04/09/2013   LIMA-LAD, fLRAD-RI, SVG-RCA; Echo 10/29/'15:  mild Conc LVH, no WMA, Gr 1 DD, Mod AoV Sclerosis w/ Mod AI, ~Mild-Mod MS    Sleep apnea    Stroke (HCC)    2007   Thyroid  disease       Objective:    Wts  08/12/2024      209  04/10/2024         205  01/03/2024       210   12/28/23 210 lb 6.4 oz (95.4 kg)  12/19/23 209 lb 6.4 oz (95 kg)  02/06/23 193 lb 5.5 oz (87.7 kg)    Vital signs reviewed  08/12/2024  - Note at rest 02 sats  97% on RA   General appearance:    amb hoarse somewhat frail  wm nad  HEENT : Oropharynx  clear/edentulous           NECK :  without  apparent JVD/ palpable Nodes/TM    LUNGS: no acc muscle use,  Nl contour chest which is clear to A and P bilaterally without cough on insp or exp maneuvers   CV:  RRR  no s3 or 1-2/6 sem s increase P2, and no significant  edema   ABD:  soft and nontender   MS:  Gait nl   ext warm without deformities Or obvious joint restrictions  calf tenderness, cyanosis or clubbing    SKIN: warm and dry without lesions    NEURO:  alert, approp, nl sensorium with  no motor or cerebellar deficits apparent.      Assessment   Assessment & Plan DOE (dyspnea on exertion) Onset around 2012 > CABG but never back to baseline  - PFTs  2016 s obstruction but reported response  to albuterol  inhaler - Echo 02/18/20    1. Left ventricular ejection fraction, by estimation, is 65 to 70%. The  left ventricle has normal function. The left ventricle has no regional  wall motion abnormalities. There is mild left ventricular hypertrophy of  the basal-septal segment. LV diastolic parameters are consistent with Grade II diastolic  dysfunction (pseudonormalization). Elevated left ventricular end-diastolic  pressure.   2. Right ventricular systolic function is moderately reduced. The right  ventricular size is mildly enlarged. There is normal pulmonary artery  systolic pressure.   3. Left atrial size was mildly dilated.   4. The mitral valve is degenerative. Mild mitral valve regurgitation.  Moderate mitral stenosis.   5. The aortic valve is tricuspid. Aortic valve regurgitation is moderate.  Moderate aortic valve stenosis  - Allergy screen 12/19/23  >  Eos 0.3/  IgE  184 - Sniff 01/02/2024   R phrenic nerve palsy  - 01/03/2024  After extensive coaching inhaler device,  effectiveness =    80% DPI  > try off stiolto and compare to taking it  - 01/03/2024   Walked on RA   x  1   lap(s) =  approx 150  ft  @ slow pace, stopped due to felt light headed  with lowest 02 sats 94% s sob   - PFT's  02/07/24  FEV1 1.80 (47 % ) ratio 0.70  p 36 % improvement from saba p 0 prior to study with DLCO  12.36 (42% %)   and FV curve classically concave   - Echo 04/03/24  Mod AS/Severe MS with LAE still mild  - 04/10/2024  After extensive coaching inhaler device,  effectiveness =    80% with spacer/ hfa > continue symb 80 2bid via spacer  - s/p TAVR  07/08/24 complicated by TME/ fluid overload  - 08/12/2024  After extensive coaching inhaler device,  effectiveness =    75% with spacer only  >>>> continue symbicort  80 2bid   Comment  Feels like he lost ground in hospital on BREO and has not recovered at home but his lungs are clear x for decrease if bs/dullness R base = baseline and should do fine in terms of AB component on just the low dose  symbicort  if uses consistently with spacer.   >>>  Encouraged to sleep in recliner if not comfortable flatter as this will help offfset the effect os the R HD dysfunction   >>> keep appt in dec for f/u but call in meatime prn  Each maintenance medication was reviewed in detail including emphasizing most importantly the difference between maintenance and prns and under what circumstances the prns are to be triggered using an action plan format where appropriate.  Total time for H and P, chart review, counseling, reviewing hfa/ spacer device(s) and generating customized AVS unique to this office visit / same day charting = 44 min acute evaluation  for refractory respiratory  symptoms of uncertain etiology         AVS  Patient Instructions  If trouble lying down in bed please use your recliner   Symbicort  80 Take 2 puffs first thing in am and then another 2 puffs about 12 hours later.   Work on inhaler technique:  relax and gently blow all the way out then take a nice smooth full deep breath back in, triggering the inhaler a split before you start breathing in when you are using the spacer.  Hold breath in for at least  5 seconds if you can. Blow out sybmbicort  thru nose. Rinse and gargle with water  when done.  If mouth or throat bother you at all,  try brushing teeth/gums/tongue with arm and hammer toothpaste/ make a slurry and gargle and spit out.    Keep the follow up appt in December - call sooner if needed              Ozell America, MD 08/12/2024

## 2024-08-12 NOTE — Assessment & Plan Note (Addendum)
 Onset around 2012 > CABG but never back to baseline  - PFTs  2016 s obstruction but reported response  to albuterol  inhaler - Echo 02/18/20   1. Left ventricular ejection fraction, by estimation, is 65 to 70%. The  left ventricle has normal function. The left ventricle has no regional  wall motion abnormalities. There is mild left ventricular hypertrophy of  the basal-septal segment. LV diastolic parameters are consistent with Grade II diastolic  dysfunction (pseudonormalization). Elevated left ventricular end-diastolic  pressure.   2. Right ventricular systolic function is moderately reduced. The right  ventricular size is mildly enlarged. There is normal pulmonary artery  systolic pressure.   3. Left atrial size was mildly dilated.   4. The mitral valve is degenerative. Mild mitral valve regurgitation.  Moderate mitral stenosis.   5. The aortic valve is tricuspid. Aortic valve regurgitation is moderate.  Moderate aortic valve stenosis  - Allergy screen 12/19/23  >  Eos 0.3/  IgE  184 - Sniff 01/02/2024   R phrenic nerve palsy  - 01/03/2024  After extensive coaching inhaler device,  effectiveness =    80% DPI  > try off stiolto and compare to taking it  - 01/03/2024   Walked on RA   x  1   lap(s) =  approx 150  ft  @ slow pace, stopped due to felt light headed  with lowest 02 sats 94% s sob   - PFT's  02/07/24  FEV1 1.80 (47 % ) ratio 0.70  p 36 % improvement from saba p 0 prior to study with DLCO  12.36 (42% %)   and FV curve classically concave   - Echo 04/03/24  Mod AS/Severe MS with LAE still mild  - 04/10/2024  After extensive coaching inhaler device,  effectiveness =    80% with spacer/ hfa > continue symb 80 2bid via spacer  - s/p TAVR  07/08/24 complicated by TME/ fluid overload  - 08/12/2024  After extensive coaching inhaler device,  effectiveness =    75% with spacer only  >>>> continue symbicort  80 2bid   Comment  Feels like he lost ground in hospital on BREO and has not recovered at  home but his lungs are clear x for decrease if bs/dullness R base = baseline and should do fine in terms of AB component on just the low dose symbicort  if uses consistently with spacer.   >>>  Encouraged to sleep in recliner if not comfortable flatter as this will help offfset the effect os the R HD dysfunction   >>> keep appt in dec for f/u but call in meatime prn        Each maintenance medication was reviewed in detail including emphasizing most importantly the difference between maintenance and prns and under what circumstances the prns are to be triggered using an action plan format where appropriate.  Total time for H and P, chart review, counseling, reviewing hfa/ spacer device(s) and generating customized AVS unique to this office visit / same day charting = 44 min acute evaluation  for refractory respiratory  symptoms of uncertain etiology

## 2024-08-12 NOTE — Progress Notes (Signed)
 Daily Session Note  Patient Details  Name: Ryan [name] MRN: [id number] Date of Birth: Jan 16, 1951 Referring Provider:   Flowsheet Row CARDIAC REHAB PHASE II ORIENTATION from 08/06/2024 in [facility] C[facility]  Referring Provider Stacia Beauvais MD    Encounter Date: 08/12/2024  Check In:  Session Check In - 08/12/24 1014       Check-In   Supervising physician immediately available to respond to emergencies See telemetry face sheet for immediately available MD    Location [facility]    Staff Present Harlene Gelineau, MA, RCEP, CCRP, CCET;Breon Diss [place], BSN, RN, WTA-C    Virtual Visit No    Medication changes reported     No    Fall or balance concerns reported    No    Tobacco Cessation No Change    Warm-up and Cool-down Performed on first and last piece of equipment    Resistance Training Performed Yes    VAD Patient? No    PAD/SET Patient? No      Pain Assessment   Currently in Pain? No/denies          Capillary Blood Glucose: Results for orders placed or performed during the hospital encounter of 08/11/24 (from the past 24 hours)  CBC with Differential     Status: Abnormal   Collection Time: 08/11/24  3:49 PM  Result Value Ref Range   WBC 6.7 4.0 - 10.5 K/uL   RBC 3.72 (L) 4.22 - 5.81 MIL/uL   Hemoglobin 11.5 (L) 13.0 - 17.0 g/dL   HCT 64.6 (L) 60.9 - 47.9 %   MCV 94.9 80.0 - 100.0 fL   MCH 30.9 26.0 - 34.0 pg   MCHC 32.6 30.0 - 36.0 g/dL   RDW 85.0 88.4 - 84.4 %   Platelets 160 150 - 400 K/uL   nRBC 0.0 0.0 - 0.2 %   Neutrophils Relative % 65 %   Neutro Abs 4.4 1.7 - 7.7 K/uL   Lymphocytes Relative 16 %   Lymphs Abs 1.1 0.7 - 4.0 K/uL   Monocytes Relative 13 %   Monocytes Absolute 0.8 0.1 - 1.0 K/uL   Eosinophils Relative 5 %   Eosinophils Absolute 0.3 0.0 - 0.5 K/uL   Basophils Relative 1 %   Basophils Absolute 0.1 0.0 - 0.1 K/uL   Immature Granulocytes 0 %   Abs Immature Granulocytes 0.03 0.00 - 0.07 K/uL   Comprehensive metabolic panel     Status: Abnormal   Collection Time: 08/11/24  3:49 PM  Result Value Ref Range   Sodium 143 135 - 145 mmol/L   Potassium 4.4 3.5 - 5.1 mmol/L   Chloride 104 98 - 111 mmol/L   CO2 30 22 - 32 mmol/L   Glucose, Bld 95 70 - 99 mg/dL   BUN 25 (H) 8 - 23 mg/dL   Creatinine, Ser 8.18 (H) 0.61 - 1.24 mg/dL   Calcium  9.2 8.9 - 10.3 mg/dL   Total Protein 6.6 6.5 - 8.1 g/dL   Albumin  4.2 3.5 - 5.0 g/dL   AST 37 15 - 41 U/L   ALT 27 0 - 44 U/L   Alkaline Phosphatase 136 (H) 38 - 126 U/L   Total Bilirubin 0.7 0.0 - 1.2 mg/dL   GFR, Estimated 39 (L) >60 mL/min   Anion gap 9 5 - 15  Pro Brain natriuretic peptide     Status: Abnormal   Collection Time: 08/11/24  3:49 PM  Result Value Ref Range  Pro Brain Natriuretic Peptide 3,224.0 (H) <300.0 pg/mL  Troponin T, High Sensitivity     Status: Abnormal   Collection Time: 08/11/24  3:49 PM  Result Value Ref Range   Troponin T High Sensitivity 61 (H) 0 - 19 ng/L  Urinalysis, Routine w reflex microscopic -Urine, Clean Catch     Status: None   Collection Time: 08/11/24  4:23 PM  Result Value Ref Range   Color, Urine YELLOW YELLOW   APPearance CLEAR CLEAR   Specific Gravity, Urine 1.012 1.005 - 1.030   pH 6.0 5.0 - 8.0   Glucose, UA NEGATIVE NEGATIVE mg/dL   Hgb urine dipstick NEGATIVE NEGATIVE   Bilirubin Urine NEGATIVE NEGATIVE   Ketones, ur NEGATIVE NEGATIVE mg/dL   Protein, ur NEGATIVE NEGATIVE mg/dL   Nitrite NEGATIVE NEGATIVE   Leukocytes,Ua NEGATIVE NEGATIVE  Troponin T, High Sensitivity     Status: Abnormal   Collection Time: 08/11/24  5:51 PM  Result Value Ref Range   Troponin T High Sensitivity 62 (H) 0 - 19 ng/L      Social History   Tobacco Use  Smoking Status Former   Current packs/day: 0.00   Average packs/day: 1 pack/day for 15.0 years (15.0 ttl pk-yrs)   Types: Cigarettes   Start date: 06/10/1967   Quit date: 06/09/1982   Years since quitting: 42.2  Smokeless Tobacco Never    Goals  Met:  Independence with exercise equipment Exercise tolerated well No report of concerns or symptoms today Strength training completed today  Goals Unmet:  Not Applicable  Comments: Pt able to follow exercise prescription today without complaint.  Will continue to monitor for progression.

## 2024-08-13 ENCOUNTER — Encounter: Payer: Self-pay | Admitting: Internal Medicine

## 2024-08-13 ENCOUNTER — Encounter (HOSPITAL_COMMUNITY): Payer: Self-pay

## 2024-08-13 DIAGNOSIS — Z953 Presence of xenogenic heart valve: Secondary | ICD-10-CM

## 2024-08-13 NOTE — Telephone Encounter (Signed)
 Pt seen in clinic 08/13/2023

## 2024-08-13 NOTE — Progress Notes (Signed)
 Cardiac Individual Treatment Plan  Patient Details  Name: Ryan [name] MRN: [id number] Date of Birth: [birth year] Referring Provider:   Flowsheet Row CARDIAC REHAB PHASE II ORIENTATION from 08/06/2024 in [facility] CARDIAC REHABILITATION  Referring Provider Stacia Beauvais MD    Initial Encounter Date:  Flowsheet Row CARDIAC REHAB PHASE II ORIENTATION from 08/06/2024 in [place] [place] CARDIAC [facility]ILIT[facility]  Date 08/06/24    Visit Diagnosis: Status post transcatheter aortic valve replacement (TAVR) using bioprosthesis  Patient's Home Medications on Admission:  Current Outpatient Medications:    acetaminophen  (TYLENOL ) 500 MG tablet, Take 500 mg by mouth every 6 (six) hours as needed for mild pain. , Disp: , Rfl:    albuterol  (VENTOLIN  HFA) 108 (90 Base) MCG/ACT inhaler, Inhale 2 puffs into the lungs as needed for wheezing or shortness of breath., Disp: 18 each, Rfl: 2   ALPRAZolam  (XANAX ) 1 MG tablet, Take 1 mg by mouth at bedtime., Disp: , Rfl:    apixaban (ELIQUIS) 5 MG TABS tablet, Take 1 tablet (5 mg total) by mouth 2 (two) times daily., Disp: 60 tablet, Rfl: 5   Artificial Tear Solution (SOOTHE XP OP), Place 1 drop into both eyes daily as needed (dry eyes)., Disp: , Rfl:    busPIRone (BUSPAR) 5 MG tablet, Take 5 mg by mouth daily., Disp: , Rfl:    Cholecalciferol (VITAMIN D3) 50 MCG (2000 UT) capsule, Take 2,000 Units by mouth daily., Disp: , Rfl:    citalopram  (CELEXA ) 20 MG tablet, Take 40 mg by mouth every evening., Disp: , Rfl:    docusate sodium  (COLACE) 100 MG capsule, Take 100 mg by mouth 2 (two) times a week., Disp: , Rfl:    famotidine  (PEPCID ) 20 MG tablet, One after supper, Disp: 30 tablet, Rfl: 11   folic acid  (FOLVITE ) 400 MCG tablet, Take 400 mcg by mouth daily., Disp: , Rfl:    furosemide  (LASIX ) 20 MG tablet, Take 2 tablets (40 mg total) by mouth 2 (two) times daily for 5 days. (Patient taking differently: Take 80 mg by mouth 2 (two) times daily.), Disp: 20  tablet, Rfl: 0   Homeopathic Products (RESTFUL LEGS SL), Place 1 tablet under the tongue at bedtime., Disp: , Rfl:    levothyroxine  (SYNTHROID ) 75 MCG tablet, Take 75 mcg by mouth at bedtime., Disp: , Rfl:    Magnesium  500 MG TABS, Take 500 mg by mouth 3 (three) times a week., Disp: , Rfl:    metoprolol  succinate (TOPROL -XL) 25 MG 24 hr tablet, Take 0.5 tablets (12.5 mg total) by mouth 2 (two) times daily., Disp: 45 tablet, Rfl: 1   nitroGLYCERIN  (NITROSTAT ) 0.4 MG SL tablet, Place 1 tablet (0.4 mg total) under the tongue every 5 (five) minutes as needed for chest pain., Disp: 25 tablet, Rfl: 12   OVER THE COUNTER MEDICATION, Take 2 tablets by mouth daily. Ultra kidney complex, Disp: , Rfl:    OVER THE COUNTER MEDICATION, Take 1 tablet by mouth 3 (three) times a week. Herbal laxative otc supplement, Disp: , Rfl:    pantoprazole  (PROTONIX ) 40 MG tablet, Take 1 tablet (40 mg total) by mouth daily. Take 30-60 min before first meal of the day, Disp: 30 tablet, Rfl: 2   pramipexole (MIRAPEX) 0.5 MG tablet, Take 0.5 mg by mouth daily., Disp: , Rfl:    simvastatin  (ZOCOR ) 80 MG tablet, Take 80 mg by mouth every evening. , Disp: , Rfl:    SYMBICORT  80-4.5 MCG/ACT inhaler, Inhale 2 puffs into the lungs 2 (two)  times daily., Disp: , Rfl:    vitamin B-12 (CYANOCOBALAMIN ) 500 MCG tablet, Take 500 mcg by mouth daily., Disp: , Rfl:   Past Medical History: Past Medical History:  Diagnosis Date   Adie's pupil    Anemia 07/14/2013   Borderline diabetes    CAD (coronary artery disease), native coronary artery 04/09/2013   Dist LM stenosis 70-80% (at trifurcation into LAD, CX, & 2 Ramus branches, 70% mid RCA);; b) Myoview  08/06/14: Low Risk, no Ischemia/infarction   Depression 1983   after I was dx'd w/CA; now just comes in spells  (12/22/2015)   Essential hypertension    Exertional shortness of breath    Hodgkin's lymphoma (HCC) 1983   Hypercholesterolemia    Hypothyroidism    Invasive ductal carcinoma  of breast, stage 2 (HCC) 04/27/2011   chemo, mastectomy   Left carotid artery partial occlusion 04/27/2011   CAROTIC DOPPLER 11/2013: < 40% R ICA stenosis, distal waveforms damped - suggests probably intracranial occlusoin, < 40% LICA, normal vertebrals -- no change   Mild aortic stenosis by prior echocardiogram 02/2018   By echo June 2020: Mild-Mod AS (Mean Gradient 13.5 mmHg)   Mild mitral stenosis by prior echocardiogram 08/06/2014   Most recent echo June 2020: Estimated valve area 2.83 cm (in 2019 was roughly 2 cm).  Mean gradient 6-8 mmHg.  Appears to be stable.   Personal history of chemotherapy    Pre-diabetes    Restless leg syndrome    Right carotid artery occlusion 04/27/2011   S/P CABG x 3 04/09/2013   LIMA-LAD, fLRAD-RI, SVG-RCA; Echo 10/29/'15:  mild Conc LVH, no WMA, Gr 1 DD, Mod AoV Sclerosis w/ Mod AI, ~Mild-Mod MS    S/P TAVR (transcatheter aortic valve replacement) 07/08/2024   s/p TAVR with a 29 mm Edwards Sapien 3 Ultra Resilia THV via the TF approach by Dr. Wendel and Dr. Paige   Sleep apnea    Stroke [facility])    2007   Thyroid  disease     Tobacco Use: Social History   Tobacco Use  Smoking Status Former   Current packs/day: 0.00   Average packs/day: 1 pack/day for 15.0 years (15.0 ttl pk-yrs)   Types: Cigarettes   Start date: 06/10/1967   Quit date: 06/09/1982   Years since quitting: 42.2  Smokeless Tobacco Never    Labs: Review Flowsheet  More data exists      Latest Ref Rng & Units 04/09/2013 08/22/2019 11/18/2019 05/20/2024 07/13/2024  Labs for ITP Cardiac and Pulmonary Rehab  Cholestrol 100 - 199 mg/dL - 788  858  - -  LDL (calc) 0 - 99 mg/dL - 855  65  - -  HDL-C >60 mg/dL - 53  62  - -  Trlycerides 0 - 149 mg/dL - 80  69  - -  Hemoglobin A1c 4.8 - 5.6 % - - - - 6.1   PH, Arterial 7.35 - 7.45 7.323  7.304  7.346  - - 7.365  -  PCO2 arterial 32 - 48 mmHg 46.5  49.3  47.5  - - 46.6  -  Bicarbonate 20.0 - 28.0 mmol/L 24.1  24.3  25.7  - - 28.7  31.0   28.2  26.6  -  TCO2 22 - 32 mmol/L 26  25  26  27   - - 30  33  30  28  -  Acid-base deficit 0.0 - 2.0 mmol/L 2.0  2.0  - - - -  O2 Saturation % 97.0  95.0  99.0  - - 55  59  57  91  -    Details       Multiple values from one day are sorted in reverse-chronological order          Exercise Target Goals: Exercise Program Goal: Individual exercise prescription set using results from initial 6 min walk test and THRR while considering  patient's activity barriers and safety.   Exercise Prescription Goal: Initial exercise prescription builds to 30-45 minutes a day of aerobic activity, 2-3 days per week.  Home exercise guidelines will be given to patient during program as part of exercise prescription that the participant will acknowledge.   Education: Aerobic Exercise: - Group verbal and visual presentation on the components of exercise prescription. Introduces F.I.T.T principle from ACSM for exercise prescriptions.  Reviews F.I.T.T. principles of aerobic exercise including progression. Written material provided at class time. Flowsheet Row CARDIAC REHAB PHASE II EXERCISE from 08/07/2024 in [place] CARDIAC REH[facility]ILITATION  Date 08/07/24  Educator [facility]  Instruction Review Code 1- Verbalizes Understanding    Education: Resistance Exercise: - Group verbal and visual presentation on the components of exercise prescription. Introduces F.I.T.T principle from ACSM for exercise prescriptions  Reviews F.I.T.T. principles of resistance exercise including progression. Written material provided at class time.    Education: Exercise & Equipment Safety: - Individual verbal instruction and demonstration of equipment use and safety with use of the equipment.   Education: Exercise Physiology & General Exercise Guidelines: - Group verbal and written instruction with models to review the exercise physiology of the cardiovascular system and associated critical values. Provides general exercise  guidelines with specific guidelines to those with heart or lung disease. Written material provided at class time.   Education: Flexibility, Balance, Mind/Body Relaxation: - Group verbal and visual presentation with interactive activity on the components of exercise prescription. Introduces F.I.T.T principle from ACSM for exercise prescriptions. Reviews F.I.T.T. principles of flexibility and balance exercise training including progression. Also discusses the mind body connection.  Reviews various relaxation techniques to help reduce and manage stress (i.e. Deep breathing, progressive muscle relaxation, and visualization). Balance handout provided to take home. Written material provided at class time.   Activity Barriers & Risk Stratification:  Activity Barriers & Cardiac Risk Stratification - 08/01/24 1335       Activity Barriers & Cardiac Risk Stratification   Activity Barriers Balance Concerns;Shortness of Breath;Arthritis;Deconditioning   Neruopathy bilaterally.   Cardiac Risk Stratification Moderate          6 Minute Walk:  6 Minute Walk     Row Name 08/06/24 1545         6 Minute Walk   Phase Initial     Distance 600 feet     Walk Time 4 minutes     # of Rest Breaks 1     MPH 1.7     METS 1.37     RPE 12     Perceived Dyspnea  3     VO2 Peak 4.8     Symptoms Yes (comment)     Comments one sitting break 2 min due to SOB     Resting HR 63 bpm     Resting BP 100/52     Resting Oxygen Saturation  96 %     Exercise Oxygen Saturation  during 6 min walk 95 %     Max Ex. HR 88 bpm     Max Ex. BP 120/60     2 Minute  Post BP 112/60       Interval HR   Interval Heart Rate? Yes        Oxygen Initial Assessment:   Oxygen Re-Evaluation:   Oxygen Discharge (Final Oxygen Re-Evaluation):   Initial Exercise Prescription:  Initial Exercise Prescription - 08/06/24 1500       Date of Initial Exercise RX and Referring Provider   Date 08/06/24    Referring Provider  Mallipeddi, Vishnu MD      NuStep   Level 1    SPM 50    Minutes 15    METs 1.9      REL-XR   Level 1    Speed 50    Minutes 15    METs 1.9      Prescription Details   Frequency (times per week) 2    Duration Progress to 30 minutes of continuous aerobic without signs/symptoms of physical distress      Intensity   THRR 40-80% of Max Heartrate 96-129    Ratings of Perceived Exertion 11-13    Perceived Dyspnea 0-4      Resistance Training   Training Prescription Yes    Weight 3    Reps 10-15          Perform Capillary Blood Glucose checks as needed.  Exercise Prescription Changes:   Exercise Prescription Changes     Row Name 08/06/24 1500             Response to Exercise   Blood Pressure (Admit) 100/52       Blood Pressure (Exercise) 120/60       Blood Pressure (Exit) 112/60       Heart Rate (Admit) 63 bpm       Heart Rate (Exercise) 88 bpm       Heart Rate (Exit) 70 bpm       Oxygen Saturation (Admit) 96 %       Oxygen Saturation (Exercise) 95 %       Oxygen Saturation (Exit) 96 %       Rating of Perceived Exertion (Exercise) 12       Perceived Dyspnea (Exercise) 3          Exercise Comments:   Exercise Comments     Row Name 08/06/24 1513 08/07/24 1038         Exercise Comments Patient attend orientation today.  Patient is attending Cardiac Rehabilitation Program.  Documentation for diagnosis can be found in [facility].  Reviewed medical chart, RPE/RPD, gym safety, and program guidelines.  Patient was fitted to equipment they will be using during rehab.  Patient is scheduled to start exercise on 08/07/24.   Initial ITP created and sent for review and signature by Dr. Dorn Ross, Medical Director for Cardiac Rehabilitation Program. First full day of exercise!  Patient was oriented to gym and equipment including functions, settings, policies, and procedures.  Patient's individual exercise prescription and treatment plan were reviewed.  All starting  workloads were established based on the results of the 6 minute walk test done at initial orientation visit.  The plan for exercise progression was also introduced and progression will be customized based on patient's performance and goals.         Exercise Goals and Review:   Exercise Goals     Row Name 08/06/24 1549             Exercise Goals   Increase Physical Activity Yes       Intervention Provide advice,  education, support and counseling about physical activity/exercise needs.;Develop an individualized exercise prescription for aerobic and resistive training based on initial evaluation findings, risk stratification, comorbidities and participant's personal goals.       Expected Outcomes Short Term: Attend rehab on a regular basis to increase amount of physical activity.;Long Term: Add in home exercise to make exercise part of routine and to increase amount of physical activity.;Long Term: Exercising regularly at least 3-5 days a week.       Increase Strength and Stamina Yes       Intervention Develop an individualized exercise prescription for aerobic and resistive training based on initial evaluation findings, risk stratification, comorbidities and participant's personal goals.;Provide advice, education, support and counseling about physical activity/exercise needs.       Expected Outcomes Short Term: Perform resistance training exercises routinely during rehab and add in resistance training at home;Short Term: Increase workloads from initial exercise prescription for resistance, speed, and METs.;Long Term: Improve cardiorespiratory fitness, muscular endurance and strength as measured by increased METs and functional capacity ( )       Able to understand and use rate of perceived exertion (RPE) scale Yes       Intervention Provide education and explanation on how to use RPE scale       Expected Outcomes Short Term: Able to use RPE daily in rehab to express subjective intensity  level;Long Term:  Able to use RPE to guide intensity level when exercising independently       Able to understand and use Dyspnea scale Yes       Intervention Provide education and explanation on how to use Dyspnea scale       Expected Outcomes Short Term: Able to use Dyspnea scale daily in rehab to express subjective sense of shortness of breath during exertion;Long Term: Able to use Dyspnea scale to guide intensity level when exercising independently       Knowledge and understanding of Target Heart Rate Range (THRR) Yes       Intervention Provide education and explanation of THRR including how the numbers were predicted and where they are located for reference       Expected Outcomes Short Term: Able to state/look up THRR;Long Term: Able to use THRR to govern intensity when exercising independently;Short Term: Able to use daily as guideline for intensity in rehab       Able to check pulse independently Yes       Intervention Provide education and demonstration on how to check pulse in carotid and radial arteries.;Review the importance of being able to check your own pulse for safety during independent exercise       Expected Outcomes Short Term: Able to explain why pulse checking is important during independent exercise;Long Term: Able to check pulse independently and accurately       Understanding of Exercise Prescription Yes       Intervention Provide education, explanation, and written materials on patient's individual exercise prescription       Expected Outcomes Short Term: Able to explain program exercise prescription;Long Term: Able to explain home exercise prescription to exercise independently          Exercise Goals Re-Evaluation :  Exercise Goals Re-Evaluation     Row Name 08/07/24 1038             Exercise Goal Re-Evaluation   Exercise Goals Review Able to understand and use Dyspnea scale;Knowledge and understanding of Target Heart Rate Range (THRR);Able to understand and use  rate  of perceived exertion (RPE) scale;Understanding of Exercise Prescription       Comments Reviewed RPE and dyspnea scale, THR and program prescription with pt today.  Pt voiced understanding and was given a copy of goals to take home.       Expected Outcomes Short: Use RPE daily to regulate intensity.  Long: Follow program prescription in THR.          Discharge Exercise Prescription (Final Exercise Prescription Changes):  Exercise Prescription Changes - 08/06/24 1500       Response to Exercise   Blood Pressure (Admit) 100/52    Blood Pressure (Exercise) 120/60    Blood Pressure (Exit) 112/60    Heart Rate (Admit) 63 bpm    Heart Rate (Exercise) 88 bpm    Heart Rate (Exit) 70 bpm    Oxygen Saturation (Admit) 96 %    Oxygen Saturation (Exercise) 95 %    Oxygen Saturation (Exit) 96 %    Rating of Perceived Exertion (Exercise) 12    Perceived Dyspnea (Exercise) 3          Nutrition:  Target Goals: Understanding of nutrition guidelines, daily intake of sodium 1500mg , cholesterol 200mg , calories 30% from fat and 7% or less from saturated fats, daily to have 5 or more servings of fruits and vegetables.  Education: Nutrition 1 -Group instruction provided by verbal, written material, interactive activities, discussions, models, and posters to present general guidelines for heart healthy nutrition including macronutrients, label reading, and promoting whole foods over processed counterparts. Education serves as pensions consultant of discussion of heart healthy eating for all. Written material provided at class time.    Education: Nutrition 2 -Group instruction provided by verbal, written material, interactive activities, discussions, models, and posters to present general guidelines for heart healthy nutrition including sodium, cholesterol, and saturated fat. Providing guidance of habit forming to improve blood pressure, cholesterol, and body weight. Written material provided at class  time.     Biometrics:  Pre Biometrics - 08/06/24 1550       Pre Biometrics   Height 6' 3 (1.905 m)    Weight 213 lb 13.5 oz (97 kg)    Waist Circumference 42 inches    Hip Circumference 43 inches    Waist to Hip Ratio 0.98 %    BMI (Calculated) 26.73    Grip Strength 29.7 kg    Single Leg Stand 3.08 seconds           Nutrition Therapy Plan and Nutrition Goals:   Nutrition Assessments:  Nutrition Assessments - 08/06/24 1534       Rate Your Plate Scores   Pre Score 59         MEDIFICTS Score Key: >=70 Need to make dietary changes  40-70 Heart Healthy Diet <= 40 Therapeutic Level Cholesterol Diet  Flowsheet Row CARDIAC REHAB PHASE II ORIENTATION from 08/06/2024 in [facility] CARDIAC REHABILITATION  Picture Your Plate Total Score on Admission 59   Picture Your Plate Scores: <59 Unhealthy dietary pattern with much room for improvement. 41-50 Dietary pattern unlikely to meet recommendations for good health and room for improvement. 51-60 More healthful dietary pattern, with some room for improvement.  >60 Healthy dietary pattern, although there may be some specific behaviors that could be improved.    Nutrition Goals Re-Evaluation:   Nutrition Goals Discharge (Final Nutrition Goals Re-Evaluation):   Psychosocial: Target Goals: Acknowledge presence or absence of significant depression and/or stress, maximize coping skills, provide positive support system. Participant is able to  verbalize types and ability to use techniques and skills needed for reducing stress and depression.   Education: Stress, Anxiety, and Depression - Group verbal and visual presentation to define topics covered.  Reviews how body is impacted by stress, anxiety, and depression.  Also discusses healthy ways to reduce stress and to treat/manage anxiety and depression. Written material provided at class time.   Education: Sleep Hygiene -Provides group verbal and written instruction about  how sleep can affect your health.  Define sleep hygiene, discuss sleep cycles and impact of sleep habits. Review good sleep hygiene tips.   Initial Review & Psychosocial Screening:  Initial Psych Review & Screening - 08/01/24 1356       Initial Review   Current issues with Current Anxiety/Panic;Current Psychotropic Meds;Current Depression;History of Depression;Current Sleep Concerns      Family Dynamics   Good Support System? Yes    Comments Patient wife and daugher support him.      Barriers   Psychosocial barriers to participate in program The patient should benefit from training in stress management and relaxation.;There are no identifiable barriers or psychosocial needs.      Screening Interventions   Interventions To provide support and resources with identified psychosocial needs;Encouraged to exercise;Provide feedback about the scores to participant    Expected Outcomes Short Term goal: Utilizing psychosocial counselor, staff and physician to assist with identification of specific Stressors or current issues interfering with healing process. Setting desired goal for each stressor or current issue identified.;Long Term Goal: Stressors or current issues are controlled or eliminated.;Short Term goal: Identification and review with participant of any Quality of Life or Depression concerns found by scoring the questionnaire.;Long Term goal: The participant improves quality of Life and PHQ9 Scores as seen by post scores and/or verbalization of changes          Quality of Life Scores:   Quality of Life - 08/06/24 1539       Quality of Life   Select Quality of Life      Quality of Life Scores   Health/Function Pre 16.07 %    Socioeconomic Pre 23.58 %    Psych/Spiritual Pre 24.43 %    Family Pre 22.8 %    GLOBAL Pre 20.36 %         Scores of 19 and below usually indicate a poorer quality of life in these areas.  A difference of  2-3 points is a clinically meaningful  difference.  A difference of 2-3 points in the total score of the Quality of Life Index has been associated with significant improvement in overall quality of life, self-image, physical symptoms, and general health in studies assessing change in quality of life.  PHQ-9: Review Flowsheet       08/06/2024 07/31/2014 05/14/2013  Depression screen PHQ 2/9  Decreased Interest 3 0 1  Down, Depressed, Hopeless 2 0 0  PHQ - 2 Score 5 0 1  Altered sleeping 3 - -  Tired, decreased energy 3 - -  Change in appetite 3 - -  Feeling bad or failure about yourself  2 - -  Trouble concentrating 2 - -  Moving slowly or fidgety/restless 2 - -  Suicidal thoughts 2 - -  PHQ-9 Score 22 - -  Difficult doing work/chores Somewhat difficult - -   Interpretation of Total Score  Total Score Depression Severity:  1-4 = Minimal depression, 5-9 = Mild depression, 10-14 = Moderate depression, 15-19 = Moderately severe depression, 20-27 = Severe depression  Psychosocial Evaluation and Intervention:  Psychosocial Evaluation - 08/01/24 1356       Psychosocial Evaluation & Interventions   Interventions Stress management education;Relaxation education;Encouraged to exercise with the program and follow exercise prescription    Comments Patient referred to cardiac rehab with S/P TAVR. Spoke with his wife and daugther for virtual call. He has some memory issues that have worsened since his hospitalization. He is going to be screened for dementia by his pcp soon. He has had depression and anxiety long term and is being treated with aprazolam and celexa . He sleeps during the day and is up a lot at night. He has restless leg syndrome which affects his sleep. His daugther says their goals for him are to increase his energy levels and to improve his balance and stability walking. They hope the program will give him energy so he will sleep at night. He participated  in the program in 2016 when he had his CABG. He has no barriers to  complete the program.    Expected Outcomes Short Term: Patient will start the program and attend consistently. Long Term: Patient will complete the program meeting personal goals.    Continue Psychosocial Services  Follow up required by staff          Psychosocial Re-Evaluation:   Psychosocial Discharge (Final Psychosocial Re-Evaluation):   Vocational Rehabilitation: Provide vocational rehab assistance to qualifying candidates.   Vocational Rehab Evaluation & Intervention:  Vocational Rehab - 08/01/24 1355       Initial Vocational Rehab Evaluation & Intervention   Assessment shows need for Vocational Rehabilitation No      Vocational Rehab Re-Evaulation   Comments Patient is retired.          Education: Education Goals: Education classes will be provided on a variety of topics geared toward better understanding of heart health and risk factor modification. Participant will state understanding/return demonstration of topics presented as noted by education test scores.  Learning Barriers/Preferences:  Learning Barriers/Preferences - 08/01/24 1355       Learning Barriers/Preferences   Learning Barriers --   Family reports patient is having memory issues and is being screened for AD.   Learning Preferences Skilled Demonstration          General Cardiac Education Topics:  AED/CPR: - Group verbal and written instruction with the use of models to demonstrate the basic use of the AED with the basic ABC's of resuscitation.   Test and Procedures: - Group verbal and visual presentation and models provide information about basic cardiac anatomy and function. Reviews the testing methods done to diagnose heart disease and the outcomes of the test results. Describes the treatment choices: Medical Management, Angioplasty, or Coronary Bypass Surgery for treating various heart conditions including Myocardial Infarction, Angina, Valve Disease, and Cardiac Arrhythmias. Written  material provided at class time.   Medication Safety: - Group verbal and visual instruction to review commonly prescribed medications for heart and lung disease. Reviews the medication, class of the drug, and side effects. Includes the steps to properly store meds and maintain the prescription regimen. Written material provided at class time.   Intimacy: - Group verbal instruction through game format to discuss how heart and lung disease can affect sexual intimacy. Written material provided at class time. Flowsheet Row CARDIAC REHAB PHASE II EXERCISE from 08/07/2024 in [facility]  Date 08/07/24  Educator Jh  Instruction Review Code 1- Verbalizes Understanding    Know Your Numbers and Heart Failure: - Group  verbal and visual instruction to discuss disease risk factors for cardiac and pulmonary disease and treatment options.  Reviews associated critical values for Overweight/Obesity, Hypertension, Cholesterol, and Diabetes.  Discusses basics of heart failure: signs/symptoms and treatments.  Introduces Heart Failure Zone chart for action plan for heart failure. Written material provided at class time.   Infection Prevention: - Provides verbal and written material to individual with discussion of infection control including proper hand washing and proper equipment cleaning during exercise session.   Falls Prevention: - Provides verbal and written material to individual with discussion of falls prevention and safety.   Other: -Provides group and verbal instruction on various topics (see comments)   Knowledge Questionnaire Score:  Knowledge Questionnaire Score - 08/07/24 1226       Knowledge Questionnaire Score   Pre Score 18/26          Core Components/Risk Factors/Patient Goals at Admission:  Personal Goals and Risk Factors at Admission - 08/01/24 1356       Core Components/Risk Factors/Patient Goals on Admission    Weight Management Weight  Maintenance    Improve shortness of breath with ADL's Yes    Intervention Provide education, individualized exercise plan and daily activity instruction to help decrease symptoms of SOB with activities of daily living.    Expected Outcomes Short Term: Improve cardiorespiratory fitness to achieve a reduction of symptoms when performing ADLs;Long Term: Be able to perform more ADLs without symptoms or delay the onset of symptoms    Diabetes Yes    Intervention Provide education about signs/symptoms and action to take for hypo/hyperglycemia.;Provide education about proper nutrition, including hydration, and aerobic/resistive exercise prescription along with prescribed medications to achieve blood glucose in normal ranges: Fasting glucose 65-99 mg/dL    Expected Outcomes Short Term: Participant verbalizes understanding of the signs/symptoms and immediate care of hyper/hypoglycemia, proper foot care and importance of medication, aerobic/resistive exercise and nutrition plan for blood glucose control.;Long Term: Attainment of HbA1C < 7%.    Hypertension Yes    Intervention Monitor prescription use compliance.;Provide education on lifestyle modifcations including regular physical activity/exercise, weight management, moderate sodium restriction and increased consumption of fresh fruit, vegetables, and low fat dairy, alcohol moderation, and smoking cessation.    Expected Outcomes Short Term: Continued assessment and intervention until BP is < 140/82mm HG in hypertensive participants. < 130/55mm HG in hypertensive participants with diabetes, heart failure or chronic kidney disease.;Long Term: Maintenance of blood pressure at goal levels.    Lipids Yes    Intervention Provide education and support for participant on nutrition & aerobic/resistive exercise along with prescribed medications to achieve LDL 70mg , HDL >40mg .    Expected Outcomes Short Term: Participant states understanding of desired cholesterol values  and is compliant with medications prescribed. Participant is following exercise prescription and nutrition guidelines.;Long Term: Cholesterol controlled with medications as prescribed, with individualized exercise RX and with personalized nutrition plan. Value goals: LDL < 70mg , HDL > 40 mg.          Education:Diabetes - Individual verbal and written instruction to review signs/symptoms of diabetes, desired ranges of glucose level fasting, after meals and with exercise. Acknowledge that pre and post exercise glucose checks will be done for 3 sessions at entry of program.   Core Components/Risk Factors/Patient Goals Review:    Core Components/Risk Factors/Patient Goals at Discharge (Final Review):    ITP Comments:  ITP Comments     Row Name 08/01/24 1417 08/06/24 1513 08/07/24 1037 08/13/24 9144  ITP Comments Virtual orientation visit completed for cardiac rehab with S/P TAVR. On-site orientation visit scheduled for 08/06/24 at 2:30. Patient attend orientation today.  Patient is attending Cardiac Rehabilitation Program.  Documentation for diagnosis can be found in [facility].  Reviewed medical chart, RPE/RPD, gym safety, and program guidelines.  Patient was fitted to equipment they will be using during rehab.  Patient is scheduled to start exercise on 08/07/24.   Initial ITP created and sent for review and signature by Dr. Dorn Ross, Medical Director for Cardiac Rehabilitation Program. First full day of exercise!  Patient was oriented to gym and equipment including functions, settings, policies, and procedures.  Patient's individual exercise prescription and treatment plan were reviewed.  All starting workloads were established based on the results of the 6 minute walk test done at initial orientation visit.  The plan for exercise progression was also introduced and progression will be customized based on patient's performance and goals. 30 day review completed. ITP sent to Dr. Dorn Ross,  Medical Director of Cardiac Rehab. Continue with ITP unless changes are made by physician.  New to the program.       Comments: 30 day review

## 2024-08-14 ENCOUNTER — Encounter (HOSPITAL_COMMUNITY)
Admission: RE | Admit: 2024-08-14 | Discharge: 2024-08-14 | Disposition: A | Discharge status: Home / Self Care | Source: Ambulatory Visit | Attending: Internal Medicine | Admitting: Internal Medicine

## 2024-08-14 DIAGNOSIS — Z953 Presence of xenogenic heart valve: Secondary | ICD-10-CM | POA: Diagnosis not present

## 2024-08-14 NOTE — Progress Notes (Signed)
 Daily Session Note  Patient Details  Name: ADELARD SANON MRN: [id number] Date of Birth: 1951/06/13 Referring Provider:   Flowsheet Row CARDIAC REHAB PHASE II ORIENTATION from 08/06/2024 in [facility]  Referring Provider Stacia Beauvais MD    Encounter Date: 08/14/2024  Check In:  Session Check In - 08/14/24 1015       Check-In   Supervising physician immediately available to respond to emergencies See telemetry face sheet for immediately available MD    Location [facility]    Staff Present Rolland Sake BSN, RN;Dannilynn Gallina Vicci, RN, BSN;Heather Con, BS, Exercise Physiologist    Virtual Visit No    Medication changes reported     No    Fall or balance concerns reported    No    Warm-up and Cool-down Not performed (comment)    Resistance Training Performed Yes    VAD Patient? No    PAD/SET Patient? No      Pain Assessment   Currently in Pain? No/denies    Pain Score 0-No pain    Multiple Pain Sites No          Capillary Blood Glucose: No results found for this or any previous visit (from the past 24 hours).    Social History   Tobacco Use  Smoking Status Former   Current packs/day: 0.00   Average packs/day: 1 pack/day for 15.0 years (15.0 ttl pk-yrs)   Types: Cigarettes   Start date: 06/10/1967   Quit date: 06/09/1982   Years since quitting: 42.2  Smokeless Tobacco Never    Goals Met:  Independence with exercise equipment Exercise tolerated well No report of concerns or symptoms today Strength training completed today  Goals Unmet:  Not Applicable  Comments: Pt able to follow exercise prescription today without complaint.  Will continue to monitor for progression.

## 2024-08-15 ENCOUNTER — Telehealth: Payer: Self-pay | Admitting: Internal Medicine

## 2024-08-15 DIAGNOSIS — Z952 Presence of prosthetic heart valve: Secondary | ICD-10-CM

## 2024-08-15 NOTE — Telephone Encounter (Signed)
 Pt was taken to ER d/t swelling and weeping on 08/11/24. Pt denies SOB, CP. Legs are swollen but not as bad as when they went to the hospital on 08/11/24. Pt does get dizzy when he gets up too fast. Does elevate legs to hip height. Weights were taking in the morning 203.4 lb on yesterday and 206 lb today. Pt is showing signs of altered mental status. No other complaints at this time.

## 2024-08-15 NOTE — Telephone Encounter (Signed)
 Pt's daughter would like a c/b regarding pt's weight gain from 203.4 (yesterday) to 206 (today). Daughter states that ED increased Lasix  dosage from 40 MG to 80 MG for the next 5 days ( last day tomorrow). Daughter concerned as to why pt is retaining fluid even with dosage increase on Lasix . Please advise

## 2024-08-18 ENCOUNTER — Inpatient Hospital Stay (HOSPITAL_COMMUNITY): Admission: RE | Admit: 2024-08-18 | Source: Ambulatory Visit

## 2024-08-18 ENCOUNTER — Telehealth: Payer: Self-pay | Admitting: Internal Medicine

## 2024-08-18 DIAGNOSIS — K5909 Other constipation: Secondary | ICD-10-CM | POA: Diagnosis not present

## 2024-08-18 DIAGNOSIS — D649 Anemia, unspecified: Secondary | ICD-10-CM | POA: Diagnosis not present

## 2024-08-18 DIAGNOSIS — F419 Anxiety disorder, unspecified: Secondary | ICD-10-CM | POA: Diagnosis not present

## 2024-08-18 DIAGNOSIS — D696 Thrombocytopenia, unspecified: Secondary | ICD-10-CM | POA: Diagnosis not present

## 2024-08-18 DIAGNOSIS — F32A Depression, unspecified: Secondary | ICD-10-CM | POA: Diagnosis not present

## 2024-08-18 DIAGNOSIS — E1122 Type 2 diabetes mellitus with diabetic chronic kidney disease: Secondary | ICD-10-CM | POA: Diagnosis not present

## 2024-08-18 DIAGNOSIS — I519 Heart disease, unspecified: Secondary | ICD-10-CM | POA: Diagnosis not present

## 2024-08-18 DIAGNOSIS — R413 Other amnesia: Secondary | ICD-10-CM | POA: Diagnosis not present

## 2024-08-18 DIAGNOSIS — E119 Type 2 diabetes mellitus without complications: Secondary | ICD-10-CM | POA: Diagnosis not present

## 2024-08-18 DIAGNOSIS — N1832 Chronic kidney disease, stage 3b: Secondary | ICD-10-CM | POA: Diagnosis not present

## 2024-08-18 MED ORDER — TORSEMIDE 20 MG PO TABS: 40.0000 mg | ORAL_TABLET | Freq: Two times a day (BID) | ORAL | 3 refills | Status: DC

## 2024-08-18 NOTE — Telephone Encounter (Signed)
 Checking percert on the following patient for testing scheduled at [facility].    ECHO - STAT  08/18/2024

## 2024-08-18 NOTE — Telephone Encounter (Signed)
 Daughter Ilah) returned staff call.

## 2024-08-18 NOTE — Telephone Encounter (Signed)
Left a message for daughter to call office back

## 2024-08-18 NOTE — Telephone Encounter (Signed)
 Spoke to patients wife who verbalized understanding. Patients wife had no questions or concerns

## 2024-08-19 ENCOUNTER — Encounter (HOSPITAL_COMMUNITY)
Admission: RE | Admit: 2024-08-19 | Discharge: 2024-08-19 | Disposition: A | Discharge status: Home / Self Care | Source: Ambulatory Visit | Attending: Internal Medicine | Admitting: Internal Medicine

## 2024-08-19 DIAGNOSIS — Z953 Presence of xenogenic heart valve: Secondary | ICD-10-CM | POA: Diagnosis not present

## 2024-08-19 NOTE — Telephone Encounter (Signed)
 Spoke to patients wife who verbalized understanding. Patients wife had no questions or concerns

## 2024-08-19 NOTE — Progress Notes (Signed)
 Daily Session Note  Patient Details  Name: THIAGO RAGSDALE MRN: [id number] Date of Birth: [birth year] Referring Provider:   Flowsheet Row CARDIAC REHAB PHASE II ORIENTATION from 08/06/2024 in [facility] C[facility]IAC [facility]  Referring Provider Stacia Beauvais MD    Encounter Date: 08/19/2024  Check In:  Session Check In - 08/19/24 1026       Check-In   Supervising physician immediately available to respond to emergencies See telemetry face sheet for immediately available MD    Location [facility]    Staff Present Powell Benders, BS, Exercise Physiologist;Brittany Jackquline, BSN, RN, WTA-C;Amour Trigg Zina, RN    Virtual Visit No    Medication changes reported     No    Fall or balance concerns reported    No    Warm-up and Cool-down Performed on first and last piece of equipment    Resistance Training Performed Yes    VAD Patient? No    PAD/SET Patient? No      Pain Assessment   Currently in Pain? No/denies          Capillary Blood Glucose: No results found for this or any previous visit (from the past 24 hours).    Social History   Tobacco Use  Smoking Status Former   Current packs/day: 0.00   Average packs/day: 1 pack/day for 15.0 years (15.0 ttl pk-yrs)   Types: Cigarettes   Start date: 06/10/1967   Quit date: 06/09/1982   Years since quitting: 42.2  Smokeless Tobacco Never    Goals Met:  Independence with exercise equipment Exercise tolerated well No report of concerns or symptoms today Strength training completed today  Goals Unmet:  Not Applicable  Comments: Pt able to follow exercise prescription today without complaint.  Will continue to monitor for progression.

## 2024-08-20 ENCOUNTER — Encounter: Payer: Self-pay | Admitting: Physician Assistant

## 2024-08-20 ENCOUNTER — Ambulatory Visit (HOSPITAL_COMMUNITY)
Admission: RE | Admit: 2024-08-20 | Discharge: 2024-08-20 | Disposition: A | Discharge status: Home / Self Care | Source: Ambulatory Visit | Attending: Internal Medicine | Admitting: Internal Medicine

## 2024-08-20 DIAGNOSIS — Z952 Presence of prosthetic heart valve: Secondary | ICD-10-CM | POA: Diagnosis not present

## 2024-08-20 LAB — ECHOCARDIOGRAM LIMITED
AV Mean grad: 5 mmHg
AV Peak grad: 9 mmHg
Ao pk vel: 1.5 m/s
Est EF: 45
S' Lateral: 3.5 cm

## 2024-08-21 ENCOUNTER — Ambulatory Visit: Payer: Self-pay | Admitting: Internal Medicine

## 2024-08-21 ENCOUNTER — Ambulatory Visit (HOSPITAL_COMMUNITY)

## 2024-08-22 ENCOUNTER — Encounter: Payer: Self-pay | Admitting: Internal Medicine

## 2024-08-22 ENCOUNTER — Ambulatory Visit: Attending: Internal Medicine | Admitting: Internal Medicine

## 2024-08-22 VITALS — BP 130/80 | HR 72 | Ht 75.0 in | Wt 202.6 lb

## 2024-08-22 DIAGNOSIS — R443 Hallucinations, unspecified: Secondary | ICD-10-CM | POA: Insufficient documentation

## 2024-08-22 DIAGNOSIS — I480 Paroxysmal atrial fibrillation: Secondary | ICD-10-CM | POA: Diagnosis not present

## 2024-08-22 DIAGNOSIS — Z7901 Long term (current) use of anticoagulants: Secondary | ICD-10-CM | POA: Diagnosis not present

## 2024-08-22 DIAGNOSIS — R4182 Altered mental status, unspecified: Secondary | ICD-10-CM | POA: Insufficient documentation

## 2024-08-22 DIAGNOSIS — I050 Rheumatic mitral stenosis: Secondary | ICD-10-CM | POA: Diagnosis not present

## 2024-08-22 DIAGNOSIS — I349 Nonrheumatic mitral valve disorder, unspecified: Secondary | ICD-10-CM

## 2024-08-22 DIAGNOSIS — Z7189 Other specified counseling: Secondary | ICD-10-CM | POA: Insufficient documentation

## 2024-08-22 DIAGNOSIS — F0390 Unspecified dementia without behavioral disturbance: Secondary | ICD-10-CM | POA: Diagnosis not present

## 2024-08-22 DIAGNOSIS — J069 Acute upper respiratory infection, unspecified: Secondary | ICD-10-CM | POA: Diagnosis not present

## 2024-08-22 DIAGNOSIS — F17210 Nicotine dependence, cigarettes, uncomplicated: Secondary | ICD-10-CM | POA: Diagnosis not present

## 2024-08-22 DIAGNOSIS — F418 Other specified anxiety disorders: Secondary | ICD-10-CM | POA: Diagnosis not present

## 2024-08-22 MED ORDER — FUROSCIX 80 MG/10ML ~~LOC~~ CTKT: 80.0000 mL | CARTRIDGE | Freq: Once | SUBCUTANEOUS | Status: DC | PRN

## 2024-08-22 MED ORDER — TORSEMIDE 20 MG PO TABS: 60.0000 mg | ORAL_TABLET | Freq: Two times a day (BID) | ORAL | 3 refills | Status: DC

## 2024-08-22 NOTE — Progress Notes (Signed)
 Cardiology Office Note  Date: 08/22/2024   ID: MADYX DELFIN, DOB Dec 10, 1950, MRN [id number]  PCP:  Shona Norleen PEDLAR, MD  Cardiologist:  Niyam Bisping P Isidoro Santillana, MD Electrophysiologist:  [name], MD   History of Present Illness: Ryan [name] is a 73 y.o. male  Referred to cardiology clinic initially for worsening DOE, angina and exertional dizziness. Echocardiogram revealed LVEF 40 to 45%, mildly reduced RV systolic function, moderate enlargement of RV, severe AS with moderate to severe eccentric AI, severe MS, moderate dilatation of the aortic root 44 mm. RHC/LHC showed severe native vessel CAD, patent bypass grafts, RA pressure 11 mmHg, RV pressure 65/22 with end-diastolic pressure of 10 mmHg, PA pressure 53/36 with a mean of 36 mmHg, wedge pressure mean of 20 mmHg with V wave to 22 mmHg.  He was not a candidate for double valve surgery and hence underwent TAVR.  Post TAVR, he experienced delirium which has not resolved yet.  He also developed severe leg swelling.  He is here today for follow-up visit, accompanied by wife and daughter.  Prior notes/reports reviewed.  Patient reported new onset DOE and bilateral leg swelling since TAVR.  No angina.  Limited echocardiogram obtained after TAVR procedure showed normal aortic prosthesis but RV function is severely reduced and RV size was severely enlarged.  LV function also appeared to be mildly reduced.  Mitral valve was not interrogated well but it appears to be at least moderately stenotic, if not severe.  He was also delirious and started to experience new hallucinations since TAVR.  This is new.  He recently underwent dementia testing and was found to have 2 genes/variants that are strongly positive for dementia.  He does have waxing and waning, he provided me with a history today.  Past Medical History:  Diagnosis Date   Adie's pupil    Anemia 07/14/2013   Borderline diabetes    CAD (coronary artery disease), native coronary artery  04/09/2013   Dist LM stenosis 70-80% (at trifurcation into LAD, CX, & 2 Ramus branches, 70% mid RCA);; b) Myoview  08/06/14: Low Risk, no Ischemia/infarction   Depression 1983   after I was dx'd w/CA; now just comes in spells  (12/22/2015)   Essential hypertension    Exertional shortness of breath    Hodgkin's lymphoma (HCC) 1983   Hypercholesterolemia    Hypothyroidism    Invasive ductal carcinoma of breast, stage 2 (HCC) 04/27/2011   chemo, mastectomy   Left carotid artery partial occlusion 04/27/2011   CAROTIC DOPPLER 11/2013: < 40% R ICA stenosis, distal waveforms damped - suggests probably intracranial occlusoin, < 40% LICA, normal vertebrals -- no change   Mild aortic stenosis by prior echocardiogram 02/2018   By echo June 2020: Mild-Mod AS (Mean Gradient 13.5 mmHg)   Mild mitral stenosis by prior echocardiogram 08/06/2014   Most recent echo June 2020: Estimated valve area 2.83 cm (in 2019 was roughly 2 cm).  Mean gradient 6-8 mmHg.  Appears to be stable.   Personal history of chemotherapy    Pre-diabetes    Restless leg syndrome    Right carotid artery occlusion 04/27/2011   S/P CABG x 3 04/09/2013   LIMA-LAD, fLRAD-RI, SVG-RCA; Echo 10/29/'15:  mild Conc LVH, no WMA, Gr 1 DD, Mod AoV Sclerosis w/ Mod AI, ~Mild-Mod MS    S/P TAVR (transcatheter aortic valve replacement) 07/08/2024   s/p TAVR with a 29 mm Edwards Sapien 3 Ultra Resilia THV via the TF approach by Dr. Wendel and  Dr. Paige   Sleep apnea    Stroke [facility])    2007   Thyroid  disease     Past Surgical History:  Procedure Laterality Date   BIOPSY  09/08/2020   Procedure: BIOPSY;  Surgeon: Golda Claudis PENNER, MD;  [facility];  Service: Endoscopy;;  colon   BIOPSY  12/09/2021   Procedure: BIOPSY;  Surgeon: Eartha Angelia Sieving, MD;  [facility];  Service: Gastroenterology;;   BREAST BIOPSY Right 2012   CARDIAC CATHETERIZATION  2014   CATARACT EXTRACTION W/PHACO Right 06/21/2015    Procedure: CATARACT EXTRACTION PHACO AND INTRAOCULAR LENS PLACEMENT (IOC);  Surgeon: Cherene Mania, MD;  Location: [facility] OR[facility];  Service: Ophthalmology;  Laterality: Right;  CDE: 7.05   CATARACT EXTRACTION W/PHACO Left 07/01/2015   Procedure: CATARACT EXTRACTION PHACO AND INTRAOCULAR LENS PLACEMENT (IOC);  Surgeon: Cherene Mania, MD;  Location: [facility];  Service: Ophthalmology;  Laterality: Left;  CDE: 7.49   COLONOSCOPY [name] 05/06/2015   Procedure: COLONOSCOPY;  Surgeon: Claudis PENNER Golda, MD;  [facility];  Service: Endoscopy;  Laterality: [name];  930   COLONOSCOPY WITH PROPOFOL  [name] 09/08/2020   Procedure: COLONOSCOPY WITH PROPOFOL ;  Surgeon: Golda Claudis PENNER, MD;  [facility];  Service: Endoscopy;  Laterality: [name];  915   CORONARY ARTERY BYPASS GRAFT [name] 04/08/2013   Procedure: CORONARY ARTERY BYPASS GRAFTING (CABG);  Surgeon: Maude Fleeta Ochoa, MD;  Location: [facility];  Service: Open Heart Surgery;  Laterality: [name];  Times 3 using left internal mammary artery; endoscopically harvested right saphenous vein and left radial artery   ESOPHAGOGASTRODUODENOSCOPY (EGD) WITH PROPOFOL  [name] 12/09/2021   Procedure: ESOPHAGOGASTRODUODENOSCOPY (EGD) WITH PROPOFOL ;  Surgeon: Eartha Angelia Sieving, MD;  [facility];  Service: Gastroenterology;  Laterality: [name];  1250 ASA 2   INTRAOPERATIVE TRANSESOPHAGEAL ECHOCARDIOGRAM [name] 04/08/2013   Procedure: INTRAOPERATIVE TRANSESOPHAGEAL ECHOCARDIOGRAM;  Surgeon: Maude Fleeta Ochoa, MD;  Location: [facility] OR;  Service: Open Heart Surgery;  Laterality: [name];   INTRAOPERATIVE TRANSTHORACIC ECHOCARDIOGRAM [name] 07/08/2024   Procedure: ECHOCARDIOGRAM, TRANSTHORACIC;  Surgeon: Wendel Lurena POUR, MD;  Location: [facility] INVASIVE CV LAB;  Service: Cardiovascular;  Laterality: [name];   LAPAROSCOPIC APPENDECTOMY [name] 12/23/2015   Procedure: APPENDECTOMY LAPAROSCOPIC;  Surgeon: Donnice Lima, MD;  Location: [facility] OR;  Service: General;  Laterality: [name];   LEFT HEART CATHETERIZATION WITH  CORONARY ANGIOGRAM [name] 04/07/2013   Procedure: LEFT HEART CATHETERIZATION WITH CORONARY ANGIOGRAM;  Surgeon: Alm LELON Clay, MD;  Location: [facility] CATH LAB;  Service: Cardiovascular;  Laterality: [name];   LYMPH NODE BIOPSY Left 1983   neck   MASTECTOMY Right 2012   w/axillary lymph node dissection   NM MYOVIEW  LTD  08/2019    EF 55 to 60%.  LOW RISK.  No ischemia or infarction noted.   NM TREADMILL MYOVIEW  LTD  08/06/2014   Exercise 5:07 min, 6.3 METS; symptoms noted or extreme dyspnea and lightheadedness with some chest tightness. No EKG changes. No ischemia or infarction was normal EF.   PACEMAKER IMPLANT [name] 07/11/2024   Procedure: PACEMAKER IMPLANT;  Surgeon: Kennyth Chew, MD;  Location: [facility] INVASIVE CV LAB;  Service: Cardiovascular;  Laterality: [name];   POLYPECTOMY  09/08/2020   Procedure: POLYPECTOMY;  Surgeon: Golda Claudis PENNER, MD;  [facility];  Service: Endoscopy;;   RADIAL ARTERY HARVEST Left 04/08/2013   Procedure: RADIAL ARTERY HARVEST;  Surgeon: Maude Fleeta Ochoa, MD;  Location: [facility] OR;  Service: Open Heart Surgery;  Laterality: Left;   RIGHT HEART  CATH AND CORONARY ANGIOGRAPHY [name] 05/20/2024   Procedure: RIGHT HEART CATH AND CORONARY ANGIOGRAPHY;  Surgeon: Wendel Lurena POUR, MD;  [facility]: [facility] CV LAB;  Service: Cardiovascular;  Laterality: [name];   SAVORY DILATION  12/09/2021   Procedure: SAVORY DILATION;  Surgeon: Eartha Angelia Sieving, MD;  [facility];  Service: Gastroenterology;;   TONSILLECTOMY  1974   TRANSTHORACIC ECHOCARDIOGRAM  08/06/2014; 02/2018   a) Nl EF 55-60%. Gr 1 DD-high LVEDP. Mod AoV Sclerosis w/o AS. Mild AI; mild MS - mean grad 7 mmHg @ HR 122 bpm.;; b) Initial: EF 55-60%, GRII DD.  HighLAP/LVEDP.  ? Bicuspid AoV -severely Ca2+.  Mild Ao root dilation.  Severe MAC, mild MS (MVA by P1/2 T & continuity equation ~2 cm).  Mod RV dilation w/ mild- mod reduced EF.---> Limited w/Defiinity: No RWMA, Mild AS, Mod MS (~mean gradient 8 mmHg    TRANSTHORACIC ECHOCARDIOGRAM  02/2020   Normal LV size and function.  EF 65 to 70% no R WMA.  Mild LVH with basal septum.  GRII DD with elevated LVEDP.  Moderate reduced RV function with mild RV dilation, but normal PA pressures.  Mild LA dilation.  Moderate mitral stenosis, moderate AS & AI.  Mild MR, Mod MS (mean gradient 9 mmHg).  Moderate calcified AS w/ Mod AI -> Mean gradient 17.3 mmHg with peak 32 mm.   TRANSTHORACIC ECHOCARDIOGRAM  03/2019    EF 60-65%, mild concentric LVH. Gr 2 DD. No RWMA. Mild-Mod RV dilation & enlargement. Mild LA dilation. Mod MV Calcification . Mod MS (previously read as mild, but mean gradient is lower than and estimated valve area is higher than last check).  Mild-Mod AS (Mean Gradient 13.5 mmHg)    Current Outpatient Medications  Medication Sig Dispense Refill   acetaminophen  (TYLENOL ) 500 MG tablet Take 500 mg by mouth every 6 (six) hours as needed for mild pain.      albuterol  (VENTOLIN  HFA) 108 (90 Base) MCG/ACT inhaler Inhale 2 puffs into the lungs as needed for wheezing or shortness of breath. 18 each 2   ALPRAZolam  (XANAX ) 1 MG tablet Take 1 mg by mouth at bedtime.     apixaban (ELIQUIS) 5 MG TABS tablet Take 1 tablet (5 mg total) by mouth 2 (two) times daily. 60 tablet 5   Artificial Tear Solution (SOOTHE XP OP) Place 1 drop into both eyes daily as needed (dry eyes).     busPIRone (BUSPAR) 5 MG tablet Take 5 mg by mouth daily.     Cholecalciferol (VITAMIN D3) 50 MCG (2000 UT) capsule Take 2,000 Units by mouth daily.     citalopram  (CELEXA ) 20 MG tablet Take 40 mg by mouth every evening.     docusate sodium  (COLACE) 100 MG capsule Take 100 mg by mouth 2 (two) times a week.     famotidine  (PEPCID ) 20 MG tablet One after supper 30 tablet 11   Homeopathic Products (RESTFUL LEGS SL) Place 1 tablet under the tongue at bedtime.     levothyroxine  (SYNTHROID ) 75 MCG tablet Take 75 mcg by mouth at bedtime.     Magnesium  500 MG TABS Take 500 mg by mouth 3 (three)  times a week.     metoprolol  succinate (TOPROL -XL) 25 MG 24 hr tablet Take 0.5 tablets (12.5 mg total) by mouth 2 (two) times daily. 45 tablet 1   nitroGLYCERIN  (NITROSTAT ) 0.4 MG SL tablet Place 1 tablet (0.4 mg total) under the tongue every 5 (five) minutes as needed for  chest pain. 25 tablet 12   OVER THE COUNTER MEDICATION Take 2 tablets by mouth daily. Ultra kidney complex     OVER THE COUNTER MEDICATION Take 1 tablet by mouth 3 (three) times a week. Herbal laxative otc supplement     pantoprazole  (PROTONIX ) 40 MG tablet Take 1 tablet (40 mg total) by mouth daily. Take 30-60 min before first meal of the day 30 tablet 2   pramipexole (MIRAPEX) 0.5 MG tablet Take 0.5 mg by mouth daily.     simvastatin  (ZOCOR ) 80 MG tablet Take 80 mg by mouth every evening.      SYMBICORT  80-4.5 MCG/ACT inhaler Inhale 2 puffs into the lungs 2 (two) times daily.     torsemide (DEMADEX) 20 MG tablet Take 2 tablets (40 mg total) by mouth 2 (two) times daily. 360 tablet 3   vitamin B-12 (CYANOCOBALAMIN ) 500 MCG tablet Take 500 mcg by mouth daily.     folic acid  (FOLVITE ) 400 MCG tablet Take 400 mcg by mouth daily. (Patient not taking: Reported on 08/22/2024)     No current facility-administered medications for this visit.   Allergies:  Patient has no known allergies.   Social History: The patient  reports that he quit smoking about 42 years ago. His smoking use included cigarettes. He started smoking about 57 years ago. He has a 15 pack-year smoking history. He has never used smokeless tobacco. He reports that he does not drink alcohol and does not use drugs.   Family History: The patient's family history includes Alzheimer's disease in his mother; Breast cancer in his paternal aunt; Diabetes in his mother, paternal grandfather, and paternal grandmother; Hyperlipidemia in his mother; Hypertension in his father and mother; Ovarian cancer in his paternal aunt; Stroke in his mother.   ROS:  Please see the history of  present illness. Otherwise, complete review of systems is positive for none  All other systems are reviewed and negative.   Physical Exam: VS:  BP 130/80 (BP Location: Right Arm, Cuff Size: Normal)   Pulse 72   Ht 6' 3 (1.905 m)   Wt 202 lb 9.6 oz (91.9 kg)   SpO2 96%   BMI 25.32 kg/m , BMI Body mass index is 25.32 kg/m.  Wt Readings from Last 3 Encounters:  08/22/24 202 lb 9.6 oz (91.9 kg)  08/12/24 209 lb (94.8 kg)  08/11/24 213 lb 13.5 oz (97 kg)    General: Patient appears comfortable at rest. HEENT: Conjunctiva and lids normal, oropharynx clear with moist mucosa. Neck: Supple, no elevated JVP or carotid bruits, no thyromegaly. Lungs: Clear to auscultation, nonlabored breathing at rest. Cardiac: S1-S2 present Abdomen: Soft, nontender, no hepatomegaly, bowel sounds present, no guarding or rebound. Extremities: 2-3+ pitting edema in bilateral lower EXTR's. Skin: Warm and dry. Musculoskeletal: No kyphosis. Neuropsychiatric: Alert and oriented x3, affect grossly appropriate.  Recent Labwork: 12/19/2023: BNP 193.1; TSH 5.320 07/11/2024: Magnesium  2.0 08/11/2024: ALT 27; AST 37; BUN 25; Creatinine, Ser 1.81; Hemoglobin 11.5; Platelets 160; Potassium 4.4; Pro Brain Natriuretic Peptide 3,224.0; Sodium 143     Component Value Date/Time   CHOL 141 11/18/2019 0925   TRIG 69 11/18/2019 0925   HDL 62 11/18/2019 0925   CHOLHDL 2.3 11/18/2019 0925   CHOLHDL 5.0 03/22/2011 1630   VLDL 39 03/22/2011 1630   LDLCALC 65 11/18/2019 0925     Assessment and Plan:   Acute on chronic systolic and diastolic heart failure Biventricular heart failure Severe aortic valve stenosis s/p TAVR c/w trifascicular block  s/p PPM Severe mitral stenosis from MAC c/w RV failure Dementia with auditory/visual hallucinations Goals of care discussion - Patient underwent TAVR on 07/08/2024 (he was not a candidate for double valve surgery due to comorbidities and also he underwent radiation therapy for  Hodgkin's lymphoma in the past).  He reported new onset of DOE and bilateral leg swelling since discharge from the hospital.  He reported that he did not notice a difference in his symptoms after the procedure. - Limited echocardiogram from 08/20/2024 showed mildly reduced LV function, severe RV systolic dysfunction, severe RV enlargement, normal TAVR prosthesis, at least moderately stenotic mitral valve, if not more.  Prior echocardiogram showed severe mitral valve stenosis from severe mitral annular calcification. - Currently on p.o. torsemide 40 mg twice daily.  Increase the dose of torsemide from 40 mg to 60 mg twice daily.  Will mail [place] Furoscix  to avoid ER trips. - He also had delirium after his TAVR procedure which never resolved.  He eventually developed new hallucinations.  He had waxing and waning of mental status at home.  He was also forgetful of his surroundings and his family members.  He was tested for dementia and was found to have 2 strong alleles/genes for dementia. - Due to new onset of dementia with hallucinations, patient is not a candidate for any invasive cardiac procedures.  Recommend medical management. Discussed case with Dr. Wendel.  Will diurese him and refer him to palliative care.  I had an extensive discussion with the patient's daughter, wife and the patient himself.  Patient and patient's family is aware that his life expectancy is 6 months or less without any cardiac interventions. - Dental cleanings twice per year and SBE prophylaxis prior to any dental procedures due to TAVR prosthesis in place. - Follows with electrophysiology for PPM device checks.  New onset delirium/dementia with hallucinations - will obtain blood cultures to rule out any infection, although unlikely.  Postop atrial fibrillation - Started on Eliquis 5 mg twice daily.  Repeat EKG AV dual paced rhythm on 08/11/2024.  CAD s/p CABG (LIMA to LAD, SVG to RCA, L radial to ramus) in 2014 - LHC showed  patent grafts. - Not on aspirin  due to Eliquis use. - Continue simvastatin  80 mg nightly.  Carotid artery stenosis - Occluded R ICA and mild to moderate L ICA stenosis.  Follows up with vessel surgery.  HLD, unknown values - Continue simvastatin  80 mg nightly.  Goal LDL less than 55.  60 minutes spent with the patient, daughter and wife have an extensive discussion about his medical condition.  10 minutes spent in documentation.  Medication Adjustments/Labs and Tests Ordered: Current medicines are reviewed at length with the patient today.  Concerns regarding medicines are outlined above.    Disposition:  Follow up 6 weeks  Signed Gurvir Schrom Priya Deliyah Muckle, MD, 08/22/2024 1:46 PM    [facility] [address] [place], [place], [place] [address]

## 2024-08-22 NOTE — Patient Instructions (Signed)
 Medication Instructions:  Your physician has recommended you make the following change in your medication:   -Increase Torsemide to 60 mg twice daily   *If you need a refill on your cardiac medications before your next appointment, please call your pharmacy*  Lab Work: Blood Cultures- These are ordered at [facility].   If you have labs (blood work) drawn today and your tests are completely normal, you will receive your results only by: MyChart Message (if you have MyChart) OR A paper copy in the mail If you have any lab test that is abnormal or we need to change your treatment, we will call you to review the results.  Testing/Procedures: None  Follow-Up: At [facility], you and your health needs are our priority.  As part of our continuing mission to provide you with exceptional heart care, our providers are all part of one team.  This team includes your primary Cardiologist (physician) and Advanced Practice Providers or APPs (Physician Assistants and Nurse Practitioners) who all work together to provide you with the care you need, when you need it.  Your next appointment:   6 week(s)  Provider:   You may see Vishnu P Mallipeddi, MD or one of the following Advanced Practice Providers on your designated Care Team:   Brittany Strader, PA-C  [facility] [place], [place] Olivia [place], [place]     We recommend signing up for the patient portal called MyChart.  Sign up information is provided on this After Visit Summary.  MyChart is used to connect with patients for Virtual Visits (Telemedicine).  Patients are able to view lab/test results, encounter notes, upcoming appointments, etc.  Non-urgent messages can be sent to your provider as well.   To learn more about what you can do with MyChart, go to [email or web address].   Other Instructions A referral for Palliative Care has been sent. Someone will reach out to you in the next couple of days to begin care.

## 2024-08-25 ENCOUNTER — Ambulatory Visit: Admitting: Physician Assistant

## 2024-08-25 ENCOUNTER — Ambulatory Visit (HOSPITAL_COMMUNITY)

## 2024-08-25 ENCOUNTER — Other Ambulatory Visit (HOSPITAL_COMMUNITY)
Admission: RE | Admit: 2024-08-25 | Discharge: 2024-08-25 | Disposition: A | Discharge status: Home / Self Care | Source: Ambulatory Visit | Attending: Physician Assistant | Admitting: Physician Assistant

## 2024-08-25 ENCOUNTER — Encounter (HOSPITAL_COMMUNITY): Payer: Self-pay

## 2024-08-25 ENCOUNTER — Other Ambulatory Visit (HOSPITAL_COMMUNITY)
Admission: RE | Admit: 2024-08-25 | Discharge: 2024-08-25 | Disposition: A | Source: Ambulatory Visit | Attending: Internal Medicine | Admitting: Internal Medicine

## 2024-08-25 VITALS — BP 142/62 | HR 60 | Ht 75.0 in

## 2024-08-25 DIAGNOSIS — I10 Essential (primary) hypertension: Secondary | ICD-10-CM | POA: Diagnosis not present

## 2024-08-25 DIAGNOSIS — I480 Paroxysmal atrial fibrillation: Secondary | ICD-10-CM | POA: Diagnosis not present

## 2024-08-25 DIAGNOSIS — I050 Rheumatic mitral stenosis: Secondary | ICD-10-CM | POA: Diagnosis not present

## 2024-08-25 DIAGNOSIS — F03C2 Unspecified dementia, severe, with psychotic disturbance: Secondary | ICD-10-CM

## 2024-08-25 DIAGNOSIS — I5023 Acute on chronic systolic (congestive) heart failure: Secondary | ICD-10-CM

## 2024-08-25 DIAGNOSIS — I2510 Atherosclerotic heart disease of native coronary artery without angina pectoris: Secondary | ICD-10-CM

## 2024-08-25 DIAGNOSIS — R4182 Altered mental status, unspecified: Secondary | ICD-10-CM | POA: Insufficient documentation

## 2024-08-25 DIAGNOSIS — Z952 Presence of prosthetic heart valve: Secondary | ICD-10-CM | POA: Diagnosis not present

## 2024-08-25 DIAGNOSIS — N1832 Chronic kidney disease, stage 3b: Secondary | ICD-10-CM

## 2024-08-25 LAB — BASIC METABOLIC PANEL WITH GFR
Anion gap: 10 (ref 5–15)
BUN: 37 mg/dL — ABNORMAL HIGH (ref 8–23)
CO2: 33 mmol/L — ABNORMAL HIGH (ref 22–32)
Calcium: 9 mg/dL (ref 8.9–10.3)
Chloride: 99 mmol/L (ref 98–111)
Creatinine, Ser: 2.25 mg/dL — ABNORMAL HIGH (ref 0.61–1.24)
GFR, Estimated: 30 mL/min — ABNORMAL LOW (ref >60)
Glucose, Bld: 83 mg/dL (ref 70–99)
Potassium: 3.5 mmol/L (ref 3.5–5.1)
Sodium: 142 mmol/L (ref 135–145)

## 2024-08-25 LAB — PRO BRAIN NATRIURETIC PEPTIDE: Pro Brain Natriuretic Peptide: 2872 pg/mL — ABNORMAL HIGH (ref <300.0)

## 2024-08-25 NOTE — Progress Notes (Signed)
 [facility]   MULTIDISCIPLINARY HEART VALVE CLINIC                                     Cardiology Office Note:    Date:  08/26/2024   ID:  Ryan [name], DOB [birth year], MRN [id number]  PCP:  Ryan Norleen PEDLAR, MD  [facility] Cardiologist:  Ryan SHAUNNA Maywood, MD  [facility] Structural heart: Ryan MARLA Red, MD [facility] Electrophysiologist:  Ryan Kitty, MD   Referring MD: Ryan Norleen PEDLAR, MD   1 month s/p TAVR  History of Present Illness:    Ryan [name] is a 73 y.o. 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    He had previously developed worsening exertional shortness of breath and fatigue as well as some episodes of dizziness. Echo on 04/03/24 showed EF 40%, mod RV dilation, mild dysfunction, LFLG AS with mean grad 23 mmHg, AVA 1.09 cm2, SVI 33, mod-severe eccentric AI, as well as severe MAC with severe mitral stenosis and dilated aortic root 44mm. [facility] 05/20/24 showed severe native CAD with patent bypass grafts. PA pressure was 53/36 with a mean of 36 with a wedge pressure of 20 mmHg and V waves to 22 mmHg. Cardiac index was 2.2. RAP 11, PAPI 1.5. Given elevated RA pressure and decreased PAPI, his lasix  was increased to 20mg  BID.   S/p TAVR with a 29 mm Edwards Sapien 3 Ultra Resilia THV via the TF approach on 07/08/24. Post operative echo showed EF 60%, mild LVH, mod RVE, mod-severe MAC with mod MS and normally functioning TAVR with a mean gradient of 4 mmHg and trivial PVL. He also had bradycardia after TAVR ultimately requiring  Abbott dual-chamber PPM by Dr. Kitty on 07/11/2024. He had new onset afib and started on Eliquis mg BID. Hospital course complicated by AMS that was felt to be related to sedatives.   Pt seen at [facility] on 08/11/24 for LE weeping, itching and AMS. Labs showed normal UA, trace pleural effusions on CXR, BNP>3000, mildly elevated troponin, creat 1.8. He was treated with IV lasix  and discharged home. Later Dr. Mallapeddi changed lasix  to torsemide 40 mg daily. Echo 08/20/24 showed LVEF mildly reduced but not well visualized, new severe RVE, at least moderate MS (could not rule out severe) and normally functioning TAVR with a mean gradient of 5 mmHg and no PVL. Seen by Dr. Mallapeddi on 08/22/24 but note incomplete. From AVS it appears that his torsemide was increased to 60mg  BID, blood cultures ordered as well as palliative care consult.   Today the patient presents to clinic for follow up. Here with daughter and wife. He has had a terrible post operative course with worsening dementia, delirium and agitation. His chronic restless legs are worse. He felt the best he has after getting IV lasix  in the ER. His legs are much improved after diuretic adjustments. L>R but weeping ulcers are scabbing over. Wearing thigh high compression stockings. Hates being on Lasix  which  makes him anxious that he is peeing his life away. Daughter shows me videos of him crawling across the floor, having jerking movements and agitation with delirium at home. Talking to people who are not there. He is unable to sleep due to jerking and agitation. Also cannot lie flat due to orthopnea and cough. He is very depressed that he is not able to drive. Wishes he had never done TAVR. Worried that morphine  administered in hospital contributed to cognitive impairment spiral.   Past Medical History:  Diagnosis Date   Adie's pupil    Anemia 07/14/2013   Borderline diabetes    CAD (coronary artery disease), native coronary artery 04/09/2013   Dist  LM stenosis 70-80% (at trifurcation into LAD, CX, & 2 Ramus branches, 70% mid RCA);; b) Myoview  08/06/14: Low Risk, no Ischemia/infarction   Depression 1983   after I was dx'd w/CA; now just comes in spells  (12/22/2015)   Essential hypertension    Exertional shortness of breath    Hodgkin's lymphoma (HCC) 1983   Hypercholesterolemia    Hypothyroidism    Invasive ductal carcinoma of breast, stage 2 (HCC) 04/27/2011   chemo, mastectomy   Left carotid artery partial occlusion 04/27/2011   CAROTIC DOPPLER 11/2013: < 40% R ICA stenosis, distal waveforms damped - suggests probably intracranial occlusoin, < 40% LICA, normal vertebrals -- no change   Mild aortic stenosis by prior echocardiogram 02/2018   By echo June 2020: Mild-Mod AS (Mean Gradient 13.5 mmHg)   Mild mitral stenosis by prior echocardiogram 08/06/2014   Most recent echo June 2020: Estimated valve area 2.83 cm (in 2019 was roughly 2 cm).  Mean gradient 6-8 mmHg.  Appears to be stable.   Personal history of chemotherapy    Pre-diabetes    Restless leg syndrome    Right carotid artery occlusion 04/27/2011   S/P CABG x 3 04/09/2013   LIMA-LAD, fLRAD-RI, SVG-RCA; Echo 10/29/'15:  mild Conc LVH, no WMA, Gr 1 DD, Mod AoV Sclerosis w/ Mod AI, ~Mild-Mod MS    S/P TAVR (transcatheter aortic valve replacement) 07/08/2024   s/p TAVR with a 29 mm Edwards Sapien 3 Ultra Resilia THV via the TF approach by Dr. Wendel and Dr. Paige   Sleep apnea    Stroke [facility])    2007   Thyroid  disease      Current Medications: Current Meds  Medication Sig   acetaminophen  (TYLENOL ) 500 MG tablet Take 500 mg by mouth every 6 (six) hours as needed for mild pain.    albuterol  (VENTOLIN  HFA) 108 (90 Base) MCG/ACT inhaler Inhale 2 puffs into the lungs as needed for wheezing or shortness of breath.   ALPRAZolam  (XANAX ) 1 MG tablet Take 1 mg by mouth at bedtime.   apixaban (ELIQUIS) 5 MG TABS tablet Take 1 tablet (5 mg total) by mouth 2 (two) times  daily.   Artificial Tear Solution (SOOTHE XP OP) Place 1 drop into both eyes daily as needed (dry eyes).   busPIRone (BUSPAR) 5 MG tablet Take 5 mg by mouth daily.   Cholecalciferol (VITAMIN D3) 50 MCG (2000 UT) capsule Take 2,000 Units by mouth daily.   citalopram  (CELEXA ) 20 MG tablet Take 40 mg by mouth every evening.   docusate sodium  (COLACE) 100 MG capsule Take 100 mg by mouth 2 (two) times a week.   famotidine  (PEPCID ) 20 MG tablet One after supper   folic acid  (FOLVITE ) 400 MCG tablet Take 400 mcg by mouth daily.   Furosemide  (FUROSCIX ) 80  MG/10ML CTKT Inject 80 mLs into the skin once as needed for up to 1 dose.   Homeopathic Products (RESTFUL LEGS SL) Place 1 tablet under the tongue at bedtime.   levothyroxine  (SYNTHROID ) 75 MCG tablet Take 75 mcg by mouth at bedtime.   Magnesium  500 MG TABS Take 500 mg by mouth 3 (three) times a week.   metoprolol  succinate (TOPROL -XL) 25 MG 24 hr tablet Take 0.5 tablets (12.5 mg total) by mouth 2 (two) times daily.   nitroGLYCERIN  (NITROSTAT ) 0.4 MG SL tablet Place 1 tablet (0.4 mg total) under the tongue every 5 (five) minutes as needed for chest pain.   OVER THE COUNTER MEDICATION Take 2 tablets by mouth daily. Ultra kidney complex   OVER THE COUNTER MEDICATION Take 1 tablet by mouth 3 (three) times a week. Herbal laxative otc supplement   pantoprazole  (PROTONIX ) 40 MG tablet Take 1 tablet (40 mg total) by mouth daily. Take 30-60 min before first meal of the day   pramipexole (MIRAPEX) 0.5 MG tablet Take 0.5 mg by mouth daily.   simvastatin  (ZOCOR ) 80 MG tablet Take 80 mg by mouth every evening.    SYMBICORT  80-4.5 MCG/ACT inhaler Inhale 2 puffs into the lungs 2 (two) times daily.   torsemide (DEMADEX) 20 MG tablet Take 3 tablets (60 mg total) by mouth 2 (two) times daily.   vitamin B-12 (CYANOCOBALAMIN ) 500 MCG tablet Take 500 mcg by mouth daily.      ROS:   Please see the history of present illness.    All other systems reviewed and are  negative.  EKGs       Risk Assessment/Calculations:    CHA2DS2-VASc Score = 5   This indicates a 7.2% annual risk of stroke. The patient's score is based upon: CHF History: 1 HTN History: 0 Diabetes History: 1 Stroke History: 0 Vascular Disease History: 1 Age Score: 2 Gender Score: 0          Physical Exam:    VS:  BP (!) 142/62   Pulse 60   Ht 6' 3 (1.905 m)   SpO2 95%   BMI 25.32 kg/m     Wt Readings from Last 3 Encounters:  08/22/24 202 lb 9.6 oz (91.9 kg)  08/12/24 209 lb (94.8 kg)  08/11/24 213 lb 13.5 oz (97 kg)     GEN: Well nourished, well developed in no acute distress NECK: No JVD CARDIAC: RRR, no murmurs, rubs, gallops RESPIRATORY:  Clear to auscultation without rales, wheezing or rhonchi  ABDOMEN: Soft, non-tender, non-distended EXTREMITIES: mild pitting LE edema L>R with healing wounds from weeping.     ASSESSMENT:    1. S/P TAVR (transcatheter aortic valve replacement)   2. Acute on chronic HFrEF (heart failure with reduced ejection fraction) (HCC)   3. Stage 3b chronic kidney disease (HCC)   4. PAF (paroxysmal atrial fibrillation) (HCC)   5. Essential hypertension   6. Coronary artery disease involving native coronary artery of native heart without angina pectoris   7. Severe mitral valve stenosis   8. Severe dementia with psychotic disturbance, unspecified dementia type (HCC)     PLAN:    In order of problems listed above:  Severe AS s/p TAVR:  -- Echo 08/20/24 showed LVEF mildly reduced but not well visualized, new severe RVE, at least moderate MS (could not rule out severe) and normally functioning TAVR with a mean gradient of 5 mmHg and no PVL.  -- NYHA class III symptoms.   Acute on chronic  HFrEF with new severe RV dysfunction: -- EF 40%--> 60-65% on POD 1 echo and echo 08/20/24 showed LVEF mildly reduced but not well visualized. -- Only felt good after IV lasix  in the ER on 08/11/24.  -- Currently taking torsemide 40 mg  daily, but seen by Dr. Mallapeddi on 08/22/24 and torsemide increased to 60mg  BID however, pt did not change dosing yet since weight coming down.  -- Has samples of Furosix to try to keep him out of the ER/hospital, but has not taken any doses yet.   -- Weight is down but confounded by real weight loss as well. Still has orthopnea and LE edema. -- BMET/BNP today and adjust meds as indicated.   CKD stage IIIb:  -- Creat up 1.81 on last check.  -- Check creat/bmet today.    PAF: -- New onset afib noted during admission.  -- CHADSVASC of at least 5. -- Continue Eliquis and Toprol  XL 12.5mg  BID.    HTN:  -- BP mildly elevated today but will not adjust meds due to need to adjust diuretic dosing.  -- Continue Toprol -XL 12.5mg  BID.   CAD s/p CABG: -- [facility] 05/20/24 showed severe native CAD with patent bypass grafts.  -- Continue medical therapy.  -- Continue Simvastatin  80mg  daily.    Mitral valve disease: -- Severe MAC with mitral valve stenosis, possibly severe with new RV dysfunction.  -- Not a candidate for surgical intervention.  Dementia with delirium/agitation/insomnia: -- This has been really debilitating for him and markedly worse since TAVR admission.  -- Upset with palliative care referral but clear he does not want to live like this.  -- I think this is made worse by insomnia and RLS. Will reach out to Neuro to see if they can move up apt or help with medications to help improve sleep/delirium.    ADDENDUM: I spoke to Majel Roanne Kiang NP with psych who felt he has hyperactive delirium and recommended quetiapine 50mg  BID (taking 25mg  at 8am, 25 at 4pm and 50mg  at bedtime). He if is unable to get sleep, she has recommended that he go to [facility] where they have a geriatric behavioral health team to help stabilize him. I have called in the medication. His QT appears to be okay when adjusted for the RBBB. Creat came back at 2.2. I have recommended that he stay on the 40mg  of torsemide  BID as his weight is slowly coming down. His daughter, Recardo, will call me with updates. Also, palliative care team consulted previously does not serve [facility] so will place another referral to [facility]. Also will refer to psych to follow in outpatient setting to follow meds and monitor QT.   I have spent over 45 minutes, face-to-face with the patient and over 50% was spent in counseling and/or coordination of care. This includes reviewing notes, labs, er visit and reaching out to other specialties and discussing at valve team meeting.     Medication Adjustments/Labs and Tests Ordered: Current medicines are reviewed at length with the patient today.  Concerns regarding medicines are outlined above.  Orders Placed This Encounter  Procedures   B Nat Peptide   Basic Metabolic Panel (BMET)   Ambulatory referral to Psychiatry   ECHOCARDIOGRAM COMPLETE   No orders of the defined types were placed in this encounter.   Patient Instructions  Medication Instructions:  Your physician recommends that you continue on your current medications as directed. Please refer to the Current Medication list given to you today.  *  If you need a refill on your cardiac medications before your next appointment, please call your pharmacy*  Lab Work: TODAY at [facility] BNP and BMP If you have labs (blood work) drawn today and your tests are completely normal, you will receive your results only by: MyChart Message (if you have MyChart) OR A paper copy in the mail If you have any lab test that is abnormal or we need to change your treatment, we will call you to review the results.  Testing/Procedures: 07/16/2025 Your physician has requested that you have an echocardiogram. Echocardiography is a painless test that uses sound waves to create images of your heart. It provides your doctor with information about the size and shape of your heart and how well your heart's chambers and valves are working. This  procedure takes approximately one hour. There are no restrictions for this procedure. Please do NOT wear cologne, perfume, aftershave, or lotions (deodorant is allowed). Please arrive 15 minutes prior to your appointment time.  Please note: We ask at that you not bring children with you during ultrasound (echo/ vascular) testing. Due to room size and safety concerns, children are not allowed in the ultrasound rooms during exams. Our front office staff cannot provide observation of children in our lobby area while testing is being conducted. An adult accompanying a patient to their appointment will only be allowed in the ultrasound room at the discretion of the ultrasound technician under special circumstances. We apologize for any inconvenience.   Follow-Up: At [facility], you and your health needs are our priority.  As part of our continuing mission to provide you with exceptional heart care, our providers are all part of one team.  This team includes your primary Cardiologist (physician) and Advanced Practice Providers or APPs (Physician Assistants and Nurse Practitioners) who all work together to provide you with the care you need, when you need it.  Your next appointment:   As scheduled on 07/16/25  Provider:   [name] Hummer, PA-C  We recommend signing up for the patient portal called MyChart.  Sign up information is provided on this After Visit Summary.  MyChart is used to connect with patients for Virtual Visits (Telemedicine).  Patients are able to view lab/test results, encounter notes, upcoming appointments, etc.  Non-urgent messages can be sent to your provider as well.   To learn more about what you can do with MyChart, go to [email or web address].          Signed, Lamarr Hummer, PA-C  08/26/2024 10:12 AM    [facility]

## 2024-08-25 NOTE — Progress Notes (Signed)
 Cardiac Individual Treatment Plan  Patient Details  Name: Ryan [name] MRN: [id number] Date of Birth: July 16, 1951 Referring Provider:   Flowsheet Row CARDIAC REHAB PHASE II ORIENTATION from 08/06/2024 in [facility] CARDIAC REHABILITATION  Referring Provider Stacia Beauvais MD    Initial Encounter Date:  Flowsheet Row CARDIAC REHAB PHASE II ORIENTATION from 08/06/2024 in [place] CARDIAC REHABILITATION  Date 08/06/24    Visit Diagnosis: No diagnosis found.  Patient's Home Medications on Admission:  Current Outpatient Medications:    acetaminophen  (TYLENOL ) 500 MG tablet, Take 500 mg by mouth every 6 (six) hours as needed for mild pain. , Disp: , Rfl:    albuterol  (VENTOLIN  HFA) 108 (90 Base) MCG/ACT inhaler, Inhale 2 puffs into the lungs as needed for wheezing or shortness of breath., Disp: 18 each, Rfl: 2   ALPRAZolam  (XANAX ) 1 MG tablet, Take 1 mg by mouth at bedtime., Disp: , Rfl:    apixaban (ELIQUIS) 5 MG TABS tablet, Take 1 tablet (5 mg total) by mouth 2 (two) times daily., Disp: 60 tablet, Rfl: 5   Artificial Tear Solution (SOOTHE XP OP), Place 1 drop into both eyes daily as needed (dry eyes)., Disp: , Rfl:    busPIRone (BUSPAR) 5 MG tablet, Take 5 mg by mouth daily., Disp: , Rfl:    Cholecalciferol (VITAMIN D3) 50 MCG (2000 UT) capsule, Take 2,000 Units by mouth daily., Disp: , Rfl:    citalopram  (CELEXA ) 20 MG tablet, Take 40 mg by mouth every evening., Disp: , Rfl:    docusate sodium  (COLACE) 100 MG capsule, Take 100 mg by mouth 2 (two) times a week., Disp: , Rfl:    famotidine  (PEPCID ) 20 MG tablet, One after supper, Disp: 30 tablet, Rfl: 11   folic acid  (FOLVITE ) 400 MCG tablet, Take 400 mcg by mouth daily., Disp: , Rfl:    Furosemide  (FUROSCIX ) 80 MG/10ML CTKT, Inject 80 mLs into the skin once as needed for up to 1 dose., Disp: , Rfl:    Homeopathic Products (RESTFUL LEGS SL), Place 1 tablet under the tongue at bedtime., Disp: , Rfl:    levothyroxine  (SYNTHROID ) 75  MCG tablet, Take 75 mcg by mouth at bedtime., Disp: , Rfl:    Magnesium  500 MG TABS, Take 500 mg by mouth 3 (three) times a week., Disp: , Rfl:    metoprolol  succinate (TOPROL -XL) 25 MG 24 hr tablet, Take 0.5 tablets (12.5 mg total) by mouth 2 (two) times daily., Disp: 45 tablet, Rfl: 1   nitroGLYCERIN  (NITROSTAT ) 0.4 MG SL tablet, Place 1 tablet (0.4 mg total) under the tongue every 5 (five) minutes as needed for chest pain., Disp: 25 tablet, Rfl: 12   OVER THE COUNTER MEDICATION, Take 2 tablets by mouth daily. Ultra kidney complex, Disp: , Rfl:    OVER THE COUNTER MEDICATION, Take 1 tablet by mouth 3 (three) times a week. Herbal laxative otc supplement, Disp: , Rfl:    pantoprazole  (PROTONIX ) 40 MG tablet, Take 1 tablet (40 mg total) by mouth daily. Take 30-60 min before first meal of the day, Disp: 30 tablet, Rfl: 2   pramipexole (MIRAPEX) 0.5 MG tablet, Take 0.5 mg by mouth daily., Disp: , Rfl:    simvastatin  (ZOCOR ) 80 MG tablet, Take 80 mg by mouth every evening. , Disp: , Rfl:    SYMBICORT  80-4.5 MCG/ACT inhaler, Inhale 2 puffs into the lungs 2 (two) times daily., Disp: , Rfl:    torsemide (DEMADEX) 20 MG tablet, Take 3 tablets (60 mg total) by  mouth 2 (two) times daily., Disp: 420 tablet, Rfl: 3   vitamin B-12 (CYANOCOBALAMIN ) 500 MCG tablet, Take 500 mcg by mouth daily., Disp: , Rfl:   Past Medical History: Past Medical History:  Diagnosis Date   Adie's pupil    Anemia 07/14/2013   Borderline diabetes    CAD (coronary artery disease), native coronary artery 04/09/2013   Dist LM stenosis 70-80% (at trifurcation into LAD, CX, & 2 Ramus branches, 70% mid RCA);; b) Myoview  08/06/14: Low Risk, no Ischemia/infarction   Depression 1983   after I was dx'd w/CA; now just comes in spells  (12/22/2015)   Essential hypertension    Exertional shortness of breath    Hodgkin's lymphoma (HCC) 1983   Hypercholesterolemia    Hypothyroidism    Invasive ductal carcinoma of breast, stage 2 (HCC)  04/27/2011   chemo, mastectomy   Left carotid artery partial occlusion 04/27/2011   CAROTIC DOPPLER 11/2013: < 40% R ICA stenosis, distal waveforms damped - suggests probably intracranial occlusoin, < 40% LICA, normal vertebrals -- no change   Mild aortic stenosis by prior echocardiogram 02/2018   By echo June 2020: Mild-Mod AS (Mean Gradient 13.5 mmHg)   Mild mitral stenosis by prior echocardiogram 08/06/2014   Most recent echo June 2020: Estimated valve area 2.83 cm (in 2019 was roughly 2 cm).  Mean gradient 6-8 mmHg.  Appears to be stable.   Personal history of chemotherapy    Pre-diabetes    Restless leg syndrome    Right carotid artery occlusion 04/27/2011   S/P CABG x 3 04/09/2013   LIMA-LAD, fLRAD-RI, SVG-RCA; Echo 10/29/'15:  mild Conc LVH, no WMA, Gr 1 DD, Mod AoV Sclerosis w/ Mod AI, ~Mild-Mod MS    S/P TAVR (transcatheter aortic valve replacement) 07/08/2024   s/p TAVR with a 29 mm Edwards Sapien 3 Ultra Resilia THV via the TF approach by Dr. Wendel and Dr. Paige   Sleep apnea    Stroke [facility])    2007   Thyroid  disease     Tobacco Use: Social History   Tobacco Use  Smoking Status Former   Current packs/day: 0.00   Average packs/day: 1 pack/day for 15.0 years (15.0 ttl pk-yrs)   Types: Cigarettes   Start date: 06/10/1967   Quit date: 06/09/1982   Years since quitting: 42.2  Smokeless Tobacco Never    Labs: Review Flowsheet  More data exists      Latest Ref Rng & Units 04/09/2013 08/22/2019 11/18/2019 05/20/2024 07/13/2024  Labs for ITP Cardiac and Pulmonary Rehab  Cholestrol 100 - 199 mg/dL - 788  858  - -  LDL (calc) 0 - 99 mg/dL - 855  65  - -  HDL-C >60 mg/dL - 53  62  - -  Trlycerides 0 - 149 mg/dL - 80  69  - -  Hemoglobin A1c 4.8 - 5.6 % - - - - 6.1   PH, Arterial 7.35 - 7.45 7.323  7.304  7.346  - - 7.365  -  PCO2 arterial 32 - 48 mmHg 46.5  49.3  47.5  - - 46.6  -  Bicarbonate 20.0 - 28.0 mmol/L 24.1  24.3  25.7  - - 28.7  31.0  28.2  26.6  -  TCO2 22 -  32 mmol/L 26  25  26  27   - - 30  33  30  28  -  Acid-base deficit 0.0 - 2.0 mmol/L 2.0  2.0  - - - -  O2  Saturation % 97.0  95.0  99.0  - - 55  59  57  91  -    Details       Multiple values from one day are sorted in reverse-chronological order          Exercise Target Goals: Exercise Program Goal: Individual exercise prescription set using results from initial 6 min walk test and THRR while considering  patient's activity barriers and safety.   Exercise Prescription Goal: Initial exercise prescription builds to 30-45 minutes a day of aerobic activity, 2-3 days per week.  Home exercise guidelines will be given to patient during program as part of exercise prescription that the participant will acknowledge.   Education: Aerobic Exercise: - Group verbal and visual presentation on the components of exercise prescription. Introduces F.I.T.T principle from ACSM for exercise prescriptions.  Reviews F.I.T.T. principles of aerobic exercise including progression. Written material provided at class time. Flowsheet Row CARDIAC REHAB PHASE II EXERCISE from 08/14/2024 in [place] CARDIAC REHABILITATION  Date 08/07/24  Educator [facility]  Instruction Review Code 1- Verbalizes Understanding    Education: Resistance Exercise: - Group verbal and visual presentation on the components of exercise prescription. Introduces F.I.T.T principle from ACSM for exercise prescriptions  Reviews F.I.T.T. principles of resistance exercise including progression. Written material provided at class time.    Education: Exercise & Equipment Safety: - Individual verbal instruction and demonstration of equipment use and safety with use of the equipment.   Education: Exercise Physiology & General Exercise Guidelines: - Group verbal and written instruction with models to review the exercise physiology of the cardiovascular system and associated critical values. Provides general exercise guidelines with specific guidelines  to those with heart or lung disease. Written material provided at class time.   Education: Flexibility, Balance, Mind/Body Relaxation: - Group verbal and visual presentation with interactive activity on the components of exercise prescription. Introduces F.I.T.T principle from ACSM for exercise prescriptions. Reviews F.I.T.T. principles of flexibility and balance exercise training including progression. Also discusses the mind body connection.  Reviews various relaxation techniques to help reduce and manage stress (i.e. Deep breathing, progressive muscle relaxation, and visualization). Balance handout provided to take home. Written material provided at class time. Flowsheet Row CARDIAC REHAB PHASE II EXERCISE from 08/14/2024 in [place] CARDIAC REHABILITATION  Date 08/14/24  Educator [facility]  Instruction Review Code 1- Verbalizes Understanding    Activity Barriers & Risk Stratification:  Activity Barriers & Cardiac Risk Stratification - 08/01/24 1335       Activity Barriers & Cardiac Risk Stratification   Activity Barriers Balance Concerns;Shortness of Breath;Arthritis;Deconditioning   Neruopathy bilaterally.   Cardiac Risk Stratification Moderate          6 Minute Walk:  6 Minute Walk     Row Name 08/06/24 1545         6 Minute Walk   Phase Initial     Distance 600 feet     Walk Time 4 minutes     # of Rest Breaks 1     MPH 1.7     METS 1.37     RPE 12     Perceived Dyspnea  3     VO2 Peak 4.8     Symptoms Yes (comment)     Comments one sitting break 2 min due to SOB     Resting HR 63 bpm     Resting BP 100/52     Resting Oxygen Saturation  96 %     Exercise  Oxygen Saturation  during 6 min walk 95 %     Max Ex. HR 88 bpm     Max Ex. BP 120/60     2 Minute Post BP 112/60       Interval HR   Interval Heart Rate? Yes        Oxygen Initial Assessment:   Oxygen Re-Evaluation:   Oxygen Discharge (Final Oxygen Re-Evaluation):   Initial Exercise Prescription:   Initial Exercise Prescription - 08/06/24 1500       Date of Initial Exercise RX and Referring Provider   Date 08/06/24    Referring Provider Mallipeddi, Vishnu MD      NuStep   Level 1    SPM 50    Minutes 15    METs 1.9      REL-XR   Level 1    Speed 50    Minutes 15    METs 1.9      Prescription Details   Frequency (times per week) 2    Duration Progress to 30 minutes of continuous aerobic without signs/symptoms of physical distress      Intensity   THRR 40-80% of Max Heartrate 96-129    Ratings of Perceived Exertion 11-13    Perceived Dyspnea 0-4      Resistance Training   Training Prescription Yes    Weight 3    Reps 10-15          Perform Capillary Blood Glucose checks as needed.  Exercise Prescription Changes:   Exercise Prescription Changes     Row Name 08/06/24 1500             Response to Exercise   Blood Pressure (Admit) 100/52       Blood Pressure (Exercise) 120/60       Blood Pressure (Exit) 112/60       Heart Rate (Admit) 63 bpm       Heart Rate (Exercise) 88 bpm       Heart Rate (Exit) 70 bpm       Oxygen Saturation (Admit) 96 %       Oxygen Saturation (Exercise) 95 %       Oxygen Saturation (Exit) 96 %       Rating of Perceived Exertion (Exercise) 12       Perceived Dyspnea (Exercise) 3          Exercise Comments:   Exercise Comments     Row Name 08/06/24 1513 08/07/24 1038         Exercise Comments Patient attend orientation today.  Patient is attending Cardiac Rehabilitation Program.  Documentation for diagnosis can be found in [facility].  Reviewed medical chart, RPE/RPD, gym safety, and program guidelines.  Patient was fitted to equipment they will be using during rehab.  Patient is scheduled to start exercise on 08/07/24.   Initial ITP created and sent for review and signature by Dr. Dorn Ross, Medical Director for Cardiac Rehabilitation Program. First full day of exercise!  Patient was oriented to gym and equipment including  functions, settings, policies, and procedures.  Patient's individual exercise prescription and treatment plan were reviewed.  All starting workloads were established based on the results of the 6 minute walk test done at initial orientation visit.  The plan for exercise progression was also introduced and progression will be customized based on patient's performance and goals.         Exercise Goals and Review:   Exercise Goals     Row  Name 08/06/24 1549             Exercise Goals   Increase Physical Activity Yes       Intervention Provide advice, education, support and counseling about physical activity/exercise needs.;Develop an individualized exercise prescription for aerobic and resistive training based on initial evaluation findings, risk stratification, comorbidities and participant's personal goals.       Expected Outcomes Short Term: Attend rehab on a regular basis to increase amount of physical activity.;Long Term: Add in home exercise to make exercise part of routine and to increase amount of physical activity.;Long Term: Exercising regularly at least 3-5 days a week.       Increase Strength and Stamina Yes       Intervention Develop an individualized exercise prescription for aerobic and resistive training based on initial evaluation findings, risk stratification, comorbidities and participant's personal goals.;Provide advice, education, support and counseling about physical activity/exercise needs.       Expected Outcomes Short Term: Perform resistance training exercises routinely during rehab and add in resistance training at home;Short Term: Increase workloads from initial exercise prescription for resistance, speed, and METs.;Long Term: Improve cardiorespiratory fitness, muscular endurance and strength as measured by increased METs and functional capacity ( )       Able to understand and use rate of perceived exertion (RPE) scale Yes       Intervention Provide education and  explanation on how to use RPE scale       Expected Outcomes Short Term: Able to use RPE daily in rehab to express subjective intensity level;Long Term:  Able to use RPE to guide intensity level when exercising independently       Able to understand and use Dyspnea scale Yes       Intervention Provide education and explanation on how to use Dyspnea scale       Expected Outcomes Short Term: Able to use Dyspnea scale daily in rehab to express subjective sense of shortness of breath during exertion;Long Term: Able to use Dyspnea scale to guide intensity level when exercising independently       Knowledge and understanding of Target Heart Rate Range (THRR) Yes       Intervention Provide education and explanation of THRR including how the numbers were predicted and where they are located for reference       Expected Outcomes Short Term: Able to state/look up THRR;Long Term: Able to use THRR to govern intensity when exercising independently;Short Term: Able to use daily as guideline for intensity in rehab       Able to check pulse independently Yes       Intervention Provide education and demonstration on how to check pulse in carotid and radial arteries.;Review the importance of being able to check your own pulse for safety during independent exercise       Expected Outcomes Short Term: Able to explain why pulse checking is important during independent exercise;Long Term: Able to check pulse independently and accurately       Understanding of Exercise Prescription Yes       Intervention Provide education, explanation, and written materials on patient's individual exercise prescription       Expected Outcomes Short Term: Able to explain program exercise prescription;Long Term: Able to explain home exercise prescription to exercise independently          Exercise Goals Re-Evaluation :  Exercise Goals Re-Evaluation     Row Name 08/07/24 1038  Exercise Goal Re-Evaluation   Exercise Goals  Review Able to understand and use Dyspnea scale;Knowledge and understanding of Target Heart Rate Range (THRR);Able to understand and use rate of perceived exertion (RPE) scale;Understanding of Exercise Prescription       Comments Reviewed RPE and dyspnea scale, THR and program prescription with pt today.  Pt voiced understanding and was given a copy of goals to take home.       Expected Outcomes Short: Use RPE daily to regulate intensity.  Long: Follow program prescription in THR.          Discharge Exercise Prescription (Final Exercise Prescription Changes):  Exercise Prescription Changes - 08/06/24 1500       Response to Exercise   Blood Pressure (Admit) 100/52    Blood Pressure (Exercise) 120/60    Blood Pressure (Exit) 112/60    Heart Rate (Admit) 63 bpm    Heart Rate (Exercise) 88 bpm    Heart Rate (Exit) 70 bpm    Oxygen Saturation (Admit) 96 %    Oxygen Saturation (Exercise) 95 %    Oxygen Saturation (Exit) 96 %    Rating of Perceived Exertion (Exercise) 12    Perceived Dyspnea (Exercise) 3          Nutrition:  Target Goals: Understanding of nutrition guidelines, daily intake of sodium 1500mg , cholesterol 200mg , calories 30% from fat and 7% or less from saturated fats, daily to have 5 or more servings of fruits and vegetables.  Education: Nutrition 1 -Group instruction provided by verbal, written material, interactive activities, discussions, models, and posters to present general guidelines for heart healthy nutrition including macronutrients, label reading, and promoting whole foods over processed counterparts. Education serves as pensions consultant of discussion of heart healthy eating for all. Written material provided at class time.    Education: Nutrition 2 -Group instruction provided by verbal, written material, interactive activities, discussions, models, and posters to present general guidelines for heart healthy nutrition including sodium, cholesterol, and saturated  fat. Providing guidance of habit forming to improve blood pressure, cholesterol, and body weight. Written material provided at class time.     Biometrics:  Pre Biometrics - 08/06/24 1550       Pre Biometrics   Height 6' 3 (1.905 m)    Weight 213 lb 13.5 oz (97 kg)    Waist Circumference 42 inches    Hip Circumference 43 inches    Waist to Hip Ratio 0.98 %    BMI (Calculated) 26.73    Grip Strength 29.7 kg    Single Leg Stand 3.08 seconds           Nutrition Therapy Plan and Nutrition Goals:   Nutrition Assessments:  Nutrition Assessments - 08/06/24 1534       Rate Your Plate Scores   Pre Score 59         MEDIFICTS Score Key: >=70 Need to make dietary changes  40-70 Heart Healthy Diet <= 40 Therapeutic Level Cholesterol Diet  Flowsheet Row CARDIAC REHAB PHASE II ORIENTATION from 08/06/2024 in [facility] CARDIAC REHABILITATION  Picture Your Plate Total Score on Admission 59   Picture Your Plate Scores: <59 Unhealthy dietary pattern with much room for improvement. 41-50 Dietary pattern unlikely to meet recommendations for good health and room for improvement. 51-60 More healthful dietary pattern, with some room for improvement.  >60 Healthy dietary pattern, although there may be some specific behaviors that could be improved.    Nutrition Goals Re-Evaluation:   Nutrition Goals Discharge (  Final Nutrition Goals Re-Evaluation):   Psychosocial: Target Goals: Acknowledge presence or absence of significant depression and/or stress, maximize coping skills, provide positive support system. Participant is able to verbalize types and ability to use techniques and skills needed for reducing stress and depression.   Education: Stress, Anxiety, and Depression - Group verbal and visual presentation to define topics covered.  Reviews how body is impacted by stress, anxiety, and depression.  Also discusses healthy ways to reduce stress and to treat/manage anxiety and  depression. Written material provided at class time.   Education: Sleep Hygiene -Provides group verbal and written instruction about how sleep can affect your health.  Define sleep hygiene, discuss sleep cycles and impact of sleep habits. Review good sleep hygiene tips.   Initial Review & Psychosocial Screening:  Initial Psych Review & Screening - 08/01/24 1356       Initial Review   Current issues with Current Anxiety/Panic;Current Psychotropic Meds;Current Depression;History of Depression;Current Sleep Concerns      Family Dynamics   Good Support System? Yes    Comments Patient wife and daugher support him.      Barriers   Psychosocial barriers to participate in program The patient should benefit from training in stress management and relaxation.;There are no identifiable barriers or psychosocial needs.      Screening Interventions   Interventions To provide support and resources with identified psychosocial needs;Encouraged to exercise;Provide feedback about the scores to participant    Expected Outcomes Short Term goal: Utilizing psychosocial counselor, staff and physician to assist with identification of specific Stressors or current issues interfering with healing process. Setting desired goal for each stressor or current issue identified.;Long Term Goal: Stressors or current issues are controlled or eliminated.;Short Term goal: Identification and review with participant of any Quality of Life or Depression concerns found by scoring the questionnaire.;Long Term goal: The participant improves quality of Life and PHQ9 Scores as seen by post scores and/or verbalization of changes          Quality of Life Scores:   Quality of Life - 08/06/24 1539       Quality of Life   Select Quality of Life      Quality of Life Scores   Health/Function Pre 16.07 %    Socioeconomic Pre 23.58 %    Psych/Spiritual Pre 24.43 %    Family Pre 22.8 %    GLOBAL Pre 20.36 %         Scores of  19 and below usually indicate a poorer quality of life in these areas.  A difference of  2-3 points is a clinically meaningful difference.  A difference of 2-3 points in the total score of the Quality of Life Index has been associated with significant improvement in overall quality of life, self-image, physical symptoms, and general health in studies assessing change in quality of life.  PHQ-9: Review Flowsheet       08/06/2024 07/31/2014 05/14/2013  Depression screen PHQ 2/9  Decreased Interest 3 0 1  Down, Depressed, Hopeless 2 0 0  PHQ - 2 Score 5 0 1  Altered sleeping 3 - -  Tired, decreased energy 3 - -  Change in appetite 3 - -  Feeling bad or failure about yourself  2 - -  Trouble concentrating 2 - -  Moving slowly or fidgety/restless 2 - -  Suicidal thoughts 2 - -  PHQ-9 Score 22  - -  Difficult doing work/chores Somewhat difficult - -  Details       Data saved with a previous flowsheet row definition        Interpretation of Total Score  Total Score Depression Severity:  1-4 = Minimal depression, 5-9 = Mild depression, 10-14 = Moderate depression, 15-19 = Moderately severe depression, 20-27 = Severe depression   Psychosocial Evaluation and Intervention:  Psychosocial Evaluation - 08/01/24 1356       Psychosocial Evaluation & Interventions   Interventions Stress management education;Relaxation education;Encouraged to exercise with the program and follow exercise prescription    Comments Patient referred to cardiac rehab with S/P TAVR. Spoke with his wife and daugther for virtual call. He has some memory issues that have worsened since his hospitalization. He is going to be screened for dementia by his pcp soon. He has had depression and anxiety long term and is being treated with aprazolam and celexa . He sleeps during the day and is up a lot at night. He has restless leg syndrome which affects his sleep. His daugther says their goals for him are to increase his energy  levels and to improve his balance and stability walking. They hope the program will give him energy so he will sleep at night. He participated  in the program in 2016 when he had his CABG. He has no barriers to complete the program.    Expected Outcomes Short Term: Patient will start the program and attend consistently. Long Term: Patient will complete the program meeting personal goals.    Continue Psychosocial Services  Follow up required by staff          Psychosocial Re-Evaluation:   Psychosocial Discharge (Final Psychosocial Re-Evaluation):   Vocational Rehabilitation: Provide vocational rehab assistance to qualifying candidates.   Vocational Rehab Evaluation & Intervention:  Vocational Rehab - 08/01/24 1355       Initial Vocational Rehab Evaluation & Intervention   Assessment shows need for Vocational Rehabilitation No      Vocational Rehab Re-Evaulation   Comments Patient is retired.          Education: Education Goals: Education classes will be provided on a variety of topics geared toward better understanding of heart health and risk factor modification. Participant will state understanding/return demonstration of topics presented as noted by education test scores.  Learning Barriers/Preferences:  Learning Barriers/Preferences - 08/01/24 1355       Learning Barriers/Preferences   Learning Barriers --   Family reports patient is having memory issues and is being screened for AD.   Learning Preferences Skilled Demonstration          General Cardiac Education Topics:  AED/CPR: - Group verbal and written instruction with the use of models to demonstrate the basic use of the AED with the basic ABC's of resuscitation.   Test and Procedures: - Group verbal and visual presentation and models provide information about basic cardiac anatomy and function. Reviews the testing methods done to diagnose heart disease and the outcomes of the test results. Describes the  treatment choices: Medical Management, Angioplasty, or Coronary Bypass Surgery for treating various heart conditions including Myocardial Infarction, Angina, Valve Disease, and Cardiac Arrhythmias. Written material provided at class time.   Medication Safety: - Group verbal and visual instruction to review commonly prescribed medications for heart and lung disease. Reviews the medication, class of the drug, and side effects. Includes the steps to properly store meds and maintain the prescription regimen. Written material provided at class time.   Intimacy: - Group verbal instruction through game format  to discuss how heart and lung disease can affect sexual intimacy. Written material provided at class time. Flowsheet Row CARDIAC REHAB PHASE II EXERCISE from 08/14/2024 in [facility]  Date 08/07/24  Educator Jh  Instruction Review Code 1- Verbalizes Understanding    Know Your Numbers and Heart Failure: - Group verbal and visual instruction to discuss disease risk factors for cardiac and pulmonary disease and treatment options.  Reviews associated critical values for Overweight/Obesity, Hypertension, Cholesterol, and Diabetes.  Discusses basics of heart failure: signs/symptoms and treatments.  Introduces Heart Failure Zone chart for action plan for heart failure. Written material provided at class time.   Infection Prevention: - Provides verbal and written material to individual with discussion of infection control including proper hand washing and proper equipment cleaning during exercise session.   Falls Prevention: - Provides verbal and written material to individual with discussion of falls prevention and safety.   Other: -Provides group and verbal instruction on various topics (see comments)   Knowledge Questionnaire Score:  Knowledge Questionnaire Score - 08/07/24 1226       Knowledge Questionnaire Score   Pre Score 18/26          Core  Components/Risk Factors/Patient Goals at Admission:  Personal Goals and Risk Factors at Admission - 08/01/24 1356       Core Components/Risk Factors/Patient Goals on Admission    Weight Management Weight Maintenance    Improve shortness of breath with ADL's Yes    Intervention Provide education, individualized exercise plan and daily activity instruction to help decrease symptoms of SOB with activities of daily living.    Expected Outcomes Short Term: Improve cardiorespiratory fitness to achieve a reduction of symptoms when performing ADLs;Long Term: Be able to perform more ADLs without symptoms or delay the onset of symptoms    Diabetes Yes    Intervention Provide education about signs/symptoms and action to take for hypo/hyperglycemia.;Provide education about proper nutrition, including hydration, and aerobic/resistive exercise prescription along with prescribed medications to achieve blood glucose in normal ranges: Fasting glucose 65-99 mg/dL    Expected Outcomes Short Term: Participant verbalizes understanding of the signs/symptoms and immediate care of hyper/hypoglycemia, proper foot care and importance of medication, aerobic/resistive exercise and nutrition plan for blood glucose control.;Long Term: Attainment of HbA1C < 7%.    Hypertension Yes    Intervention Monitor prescription use compliance.;Provide education on lifestyle modifcations including regular physical activity/exercise, weight management, moderate sodium restriction and increased consumption of fresh fruit, vegetables, and low fat dairy, alcohol moderation, and smoking cessation.    Expected Outcomes Short Term: Continued assessment and intervention until BP is < 140/109mm HG in hypertensive participants. < 130/60mm HG in hypertensive participants with diabetes, heart failure or chronic kidney disease.;Long Term: Maintenance of blood pressure at goal levels.    Lipids Yes    Intervention Provide education and support for  participant on nutrition & aerobic/resistive exercise along with prescribed medications to achieve LDL 70mg , HDL >40mg .    Expected Outcomes Short Term: Participant states understanding of desired cholesterol values and is compliant with medications prescribed. Participant is following exercise prescription and nutrition guidelines.;Long Term: Cholesterol controlled with medications as prescribed, with individualized exercise RX and with personalized nutrition plan. Value goals: LDL < 70mg , HDL > 40 mg.          Education:Diabetes - Individual verbal and written instruction to review signs/symptoms of diabetes, desired ranges of glucose level fasting, after meals and with exercise. Acknowledge that pre and post exercise  glucose checks will be done for 3 sessions at entry of program.   Core Components/Risk Factors/Patient Goals Review:    Core Components/Risk Factors/Patient Goals at Discharge (Final Review):    ITP Comments:  ITP Comments     Row Name 08/01/24 1417 08/06/24 1513 08/07/24 1037 08/13/24 0855     ITP Comments Virtual orientation visit completed for cardiac rehab with S/P TAVR. On-site orientation visit scheduled for 08/06/24 at 2:30. Patient attend orientation today.  Patient is attending Cardiac Rehabilitation Program.  Documentation for diagnosis can be found in [facility].  Reviewed medical chart, RPE/RPD, gym safety, and program guidelines.  Patient was fitted to equipment they will be using during rehab.  Patient is scheduled to start exercise on 08/07/24.   Initial ITP created and sent for review and signature by Dr. Dorn Ross, Medical Director for Cardiac Rehabilitation Program. First full day of exercise!  Patient was oriented to gym and equipment including functions, settings, policies, and procedures.  Patient's individual exercise prescription and treatment plan were reviewed.  All starting workloads were established based on the results of the 6 minute walk test done  at initial orientation visit.  The plan for exercise progression was also introduced and progression will be customized based on patient's performance and goals. 30 day review completed. ITP sent to Dr. Dorn Ross, Medical Director of Cardiac Rehab. Continue with ITP unless changes are made by physician.  New to the program.       Comments: Discharge ITP.

## 2024-08-25 NOTE — Progress Notes (Signed)
 Discharge Progress Report  Patient Details  Name: Ryan [name] MRN: [id number] Date of Birth: September 06, 1951 Referring Provider:   Flowsheet Row CARDIAC REHAB PHASE II ORIENTATION from 08/06/2024 in [facility]  Referring Provider Stacia Beauvais MD     Number of Visits: 7   Reason for Discharge:  Early Exit:  MD is worried about mental state and exercising.  Smoking History:  Social History   Tobacco Use  Smoking Status Former   Current packs/day: 0.00   Average packs/day: 1 pack/day for 15.0 years (15.0 ttl pk-yrs)   Types: Cigarettes   Start date: 06/10/1967   Quit date: 06/09/1982   Years since quitting: 42.2  Smokeless Tobacco Never    Diagnosis:  No diagnosis found.  ADL UCSD:   Initial Exercise Prescription:  Initial Exercise Prescription - 08/06/24 1500       Date of Initial Exercise RX and Referring Provider   Date 08/06/24    Referring Provider Mallipeddi, Vishnu MD      NuStep   Level 1    SPM 50    Minutes 15    METs 1.9      REL-XR   Level 1    Speed 50    Minutes 15    METs 1.9      Prescription Details   Frequency (times per week) 2    Duration Progress to 30 minutes of continuous aerobic without signs/symptoms of physical distress      Intensity   THRR 40-80% of Max Heartrate 96-129    Ratings of Perceived Exertion 11-13    Perceived Dyspnea 0-4      Resistance Training   Training Prescription Yes    Weight 3    Reps 10-15          Discharge Exercise Prescription (Final Exercise Prescription Changes):  Exercise Prescription Changes - 08/06/24 1500       Response to Exercise   Blood Pressure (Admit) 100/52    Blood Pressure (Exercise) 120/60    Blood Pressure (Exit) 112/60    Heart Rate (Admit) 63 bpm    Heart Rate (Exercise) 88 bpm    Heart Rate (Exit) 70 bpm    Oxygen Saturation (Admit) 96 %    Oxygen Saturation (Exercise) 95 %    Oxygen Saturation (Exit) 96 %    Rating of Perceived Exertion  (Exercise) 12    Perceived Dyspnea (Exercise) 3          Functional Capacity:  6 Minute Walk     Row Name 08/06/24 1545         6 Minute Walk   Phase Initial     Distance 600 feet     Walk Time 4 minutes     # of Rest Breaks 1     MPH 1.7     METS 1.37     RPE 12     Perceived Dyspnea  3     VO2 Peak 4.8     Symptoms Yes (comment)     Comments one sitting break 2 min due to SOB     Resting HR 63 bpm     Resting BP 100/52     Resting Oxygen Saturation  96 %     Exercise Oxygen Saturation  during 6 min walk 95 %     Max Ex. HR 88 bpm     Max Ex. BP 120/60     2 Minute Post BP 112/60  Interval HR   Interval Heart Rate? Yes        Psychological, QOL, Others - Outcomes: PHQ 2/9:    08/06/2024    2:50 PM 07/31/2014    9:07 AM 05/14/2013   10:23 AM  Depression screen PHQ 2/9  Decreased Interest 3 0 1  Down, Depressed, Hopeless 2 0 0  PHQ - 2 Score 5 0 1  Altered sleeping 3    Tired, decreased energy 3    Change in appetite 3    Feeling bad or failure about yourself  2    Trouble concentrating 2    Moving slowly or fidgety/restless 2    Suicidal thoughts 2    PHQ-9 Score 22     Difficult doing work/chores Somewhat difficult       Data saved with a previous flowsheet row definition    Quality of Life:  Quality of Life - 08/06/24 1539       Quality of Life   Select Quality of Life      Quality of Life Scores   Health/Function Pre 16.07 %    Socioeconomic Pre 23.58 %    Psych/Spiritual Pre 24.43 %    Family Pre 22.8 %    GLOBAL Pre 20.36 %          Personal Goals: Goals established at orientation with interventions provided to work toward goal.  Personal Goals and Risk Factors at Admission - 08/01/24 1356       Core Components/Risk Factors/Patient Goals on Admission    Weight Management Weight Maintenance    Improve shortness of breath with ADL's Yes    Intervention Provide education, individualized exercise plan and daily activity  instruction to help decrease symptoms of SOB with activities of daily living.    Expected Outcomes Short Term: Improve cardiorespiratory fitness to achieve a reduction of symptoms when performing ADLs;Long Term: Be able to perform more ADLs without symptoms or delay the onset of symptoms    Diabetes Yes    Intervention Provide education about signs/symptoms and action to take for hypo/hyperglycemia.;Provide education about proper nutrition, including hydration, and aerobic/resistive exercise prescription along with prescribed medications to achieve blood glucose in normal ranges: Fasting glucose 65-99 mg/dL    Expected Outcomes Short Term: Participant verbalizes understanding of the signs/symptoms and immediate care of hyper/hypoglycemia, proper foot care and importance of medication, aerobic/resistive exercise and nutrition plan for blood glucose control.;Long Term: Attainment of HbA1C < 7%.    Hypertension Yes    Intervention Monitor prescription use compliance.;Provide education on lifestyle modifcations including regular physical activity/exercise, weight management, moderate sodium restriction and increased consumption of fresh fruit, vegetables, and low fat dairy, alcohol moderation, and smoking cessation.    Expected Outcomes Short Term: Continued assessment and intervention until BP is < 140/85mm HG in hypertensive participants. < 130/73mm HG in hypertensive participants with diabetes, heart failure or chronic kidney disease.;Long Term: Maintenance of blood pressure at goal levels.    Lipids Yes    Intervention Provide education and support for participant on nutrition & aerobic/resistive exercise along with prescribed medications to achieve LDL 70mg , HDL >40mg .    Expected Outcomes Short Term: Participant states understanding of desired cholesterol values and is compliant with medications prescribed. Participant is following exercise prescription and nutrition guidelines.;Long Term: Cholesterol  controlled with medications as prescribed, with individualized exercise RX and with personalized nutrition plan. Value goals: LDL < 70mg , HDL > 40 mg.  Personal Goals Discharge:   Exercise Goals and Review:  Exercise Goals     Row Name 08/06/24 1549             Exercise Goals   Increase Physical Activity Yes       Intervention Provide advice, education, support and counseling about physical activity/exercise needs.;Develop an individualized exercise prescription for aerobic and resistive training based on initial evaluation findings, risk stratification, comorbidities and participant's personal goals.       Expected Outcomes Short Term: Attend rehab on a regular basis to increase amount of physical activity.;Long Term: Add in home exercise to make exercise part of routine and to increase amount of physical activity.;Long Term: Exercising regularly at least 3-5 days a week.       Increase Strength and Stamina Yes       Intervention Develop an individualized exercise prescription for aerobic and resistive training based on initial evaluation findings, risk stratification, comorbidities and participant's personal goals.;Provide advice, education, support and counseling about physical activity/exercise needs.       Expected Outcomes Short Term: Perform resistance training exercises routinely during rehab and add in resistance training at home;Short Term: Increase workloads from initial exercise prescription for resistance, speed, and METs.;Long Term: Improve cardiorespiratory fitness, muscular endurance and strength as measured by increased METs and functional capacity ( )       Able to understand and use rate of perceived exertion (RPE) scale Yes       Intervention Provide education and explanation on how to use RPE scale       Expected Outcomes Short Term: Able to use RPE daily in rehab to express subjective intensity level;Long Term:  Able to use RPE to guide intensity level  when exercising independently       Able to understand and use Dyspnea scale Yes       Intervention Provide education and explanation on how to use Dyspnea scale       Expected Outcomes Short Term: Able to use Dyspnea scale daily in rehab to express subjective sense of shortness of breath during exertion;Long Term: Able to use Dyspnea scale to guide intensity level when exercising independently       Knowledge and understanding of Target Heart Rate Range (THRR) Yes       Intervention Provide education and explanation of THRR including how the numbers were predicted and where they are located for reference       Expected Outcomes Short Term: Able to state/look up THRR;Long Term: Able to use THRR to govern intensity when exercising independently;Short Term: Able to use daily as guideline for intensity in rehab       Able to check pulse independently Yes       Intervention Provide education and demonstration on how to check pulse in carotid and radial arteries.;Review the importance of being able to check your own pulse for safety during independent exercise       Expected Outcomes Short Term: Able to explain why pulse checking is important during independent exercise;Long Term: Able to check pulse independently and accurately       Understanding of Exercise Prescription Yes       Intervention Provide education, explanation, and written materials on patient's individual exercise prescription       Expected Outcomes Short Term: Able to explain program exercise prescription;Long Term: Able to explain home exercise prescription to exercise independently          Exercise Goals Re-Evaluation:  Exercise Goals Re-Evaluation  Row Name 08/07/24 1038             Exercise Goal Re-Evaluation   Exercise Goals Review Able to understand and use Dyspnea scale;Knowledge and understanding of Target Heart Rate Range (THRR);Able to understand and use rate of perceived exertion (RPE) scale;Understanding of  Exercise Prescription       Comments Reviewed RPE and dyspnea scale, THR and program prescription with pt today.  Pt voiced understanding and was given a copy of goals to take home.       Expected Outcomes Short: Use RPE daily to regulate intensity.  Long: Follow program prescription in THR.          Nutrition & Weight - Outcomes:  Pre Biometrics - 08/06/24 1550       Pre Biometrics   Height 6' 3 (1.905 m)    Weight 213 lb 13.5 oz (97 kg)    Waist Circumference 42 inches    Hip Circumference 43 inches    Waist to Hip Ratio 0.98 %    BMI (Calculated) 26.73    Grip Strength 29.7 kg    Single Leg Stand 3.08 seconds           Nutrition:   Nutrition Discharge:  Nutrition Assessments - 08/06/24 1534       Rate Your Plate Scores   Pre Score 59          Education Questionnaire Score:  Knowledge Questionnaire Score - 08/07/24 1226       Knowledge Questionnaire Score   Pre Score 18/26          Goals reviewed with patient; copy given to patient.

## 2024-08-25 NOTE — Patient Instructions (Signed)
 Medication Instructions:  Your physician recommends that you continue on your current medications as directed. Please refer to the Current Medication list given to you today.  *If you need a refill on your cardiac medications before your next appointment, please call your pharmacy*  Lab Work: TODAY at [facility] BNP and BMP If you have labs (blood work) drawn today and your tests are completely normal, you will receive your results only by: MyChart Message (if you have MyChart) OR A paper copy in the mail If you have any lab test that is abnormal or we need to change your treatment, we will call you to review the results.  Testing/Procedures: 07/16/2025 Your physician has requested that you have an echocardiogram. Echocardiography is a painless test that uses sound waves to create images of your heart. It provides your doctor with information about the size and shape of your heart and how well your heart's chambers and valves are working. This procedure takes approximately one hour. There are no restrictions for this procedure. Please do NOT wear cologne, perfume, aftershave, or lotions (deodorant is allowed). Please arrive 15 minutes prior to your appointment time.  Please note: We ask at that you not bring children with you during ultrasound (echo/ vascular) testing. Due to room size and safety concerns, children are not allowed in the ultrasound rooms during exams. Our front office staff cannot provide observation of children in our lobby area while testing is being conducted. An adult accompanying a patient to their appointment will only be allowed in the ultrasound room at the discretion of the ultrasound technician under special circumstances. We apologize for any inconvenience.   Follow-Up: At [facility], you and your health needs are our priority.  As part of our continuing mission to provide you with exceptional heart care, our providers are all part of one team.  This team  includes your primary Cardiologist (physician) and Advanced Practice Providers or APPs (Physician Assistants and Nurse Practitioners) who all work together to provide you with the care you need, when you need it.  Your next appointment:   As scheduled on 07/16/25  Provider:   [name] Hummer, PA-C  We recommend signing up for the patient portal called MyChart.  Sign up information is provided on this After Visit Summary.  MyChart is used to connect with patients for Virtual Visits (Telemedicine).  Patients are able to view lab/test results, encounter notes, upcoming appointments, etc.  Non-urgent messages can be sent to your provider as well.   To learn more about what you can do with MyChart, go to [email or web address].

## 2024-08-26 ENCOUNTER — Other Ambulatory Visit: Payer: Self-pay | Admitting: Physician Assistant

## 2024-08-26 ENCOUNTER — Encounter: Payer: Self-pay | Admitting: Internal Medicine

## 2024-08-26 ENCOUNTER — Ambulatory Visit (HOSPITAL_COMMUNITY)

## 2024-08-26 MED ORDER — QUETIAPINE FUMARATE 50 MG PO TABS: 50.0000 mg | ORAL_TABLET | Freq: Two times a day (BID) | ORAL | 0 refills | Status: DC

## 2024-08-26 NOTE — Addendum Note (Signed)
 Addended by: [name] on: 08/26/2024 10:18 AM   Modules accepted: Orders

## 2024-08-28 ENCOUNTER — Telehealth: Payer: Self-pay | Admitting: Physician Assistant

## 2024-08-28 ENCOUNTER — Other Ambulatory Visit: Payer: Self-pay | Admitting: Physician Assistant

## 2024-08-28 ENCOUNTER — Encounter (HOSPITAL_COMMUNITY)

## 2024-08-28 DIAGNOSIS — R443 Hallucinations, unspecified: Secondary | ICD-10-CM | POA: Insufficient documentation

## 2024-08-28 DIAGNOSIS — F0390 Unspecified dementia without behavioral disturbance: Secondary | ICD-10-CM | POA: Insufficient documentation

## 2024-08-28 DIAGNOSIS — Z7901 Long term (current) use of anticoagulants: Secondary | ICD-10-CM | POA: Insufficient documentation

## 2024-08-28 DIAGNOSIS — I480 Paroxysmal atrial fibrillation: Secondary | ICD-10-CM | POA: Insufficient documentation

## 2024-08-28 DIAGNOSIS — N184 Chronic kidney disease, stage 4 (severe): Secondary | ICD-10-CM

## 2024-08-28 DIAGNOSIS — Z7189 Other specified counseling: Secondary | ICD-10-CM | POA: Insufficient documentation

## 2024-08-28 NOTE — Telephone Encounter (Signed)
  [facility]   MULTIDISCIPLINARY HEART VALVE TEAM  Called to check in on pt who had one great night sleep on seroquel but then up pacing the next night. The first night he got 50mg  back to back as directed by the psych NP. I told her it owuld be okay to try that dosing again if he is up pacing again. Breathing seems to be stable but with recent labs with AKI, will recheck BMET Monday 09/01/24. Order placed and released.    Lamarr Hummer PA-C  [facility]

## 2024-08-29 DIAGNOSIS — N184 Chronic kidney disease, stage 4 (severe): Secondary | ICD-10-CM | POA: Diagnosis not present

## 2024-08-29 LAB — BASIC METABOLIC PANEL WITH GFR
BUN/Creatinine Ratio: 14 (ref 10–24)
BUN: 34 mg/dL — ABNORMAL HIGH (ref 8–27)
CO2: 32 mmol/L — ABNORMAL HIGH (ref 20–29)
Calcium: 9.1 mg/dL (ref 8.6–10.2)
Chloride: 100 mmol/L (ref 96–106)
Creatinine, Ser: 2.38 mg/dL — ABNORMAL HIGH (ref 0.76–1.27)
Glucose: 112 mg/dL — ABNORMAL HIGH (ref 70–99)
Potassium: 3.7 mmol/L (ref 3.5–5.2)
Sodium: 143 mmol/L (ref 134–144)
eGFR: 28 mL/min/1.73 — ABNORMAL LOW (ref >59)

## 2024-08-30 LAB — CULTURE, BLOOD (SINGLE)
Culture: NO GROWTH
Special Requests: ADEQUATE

## 2024-09-01 ENCOUNTER — Ambulatory Visit: Payer: Self-pay | Admitting: Internal Medicine

## 2024-09-01 ENCOUNTER — Ambulatory Visit: Payer: Self-pay | Admitting: Physician Assistant

## 2024-09-01 DIAGNOSIS — J069 Acute upper respiratory infection, unspecified: Secondary | ICD-10-CM | POA: Diagnosis not present

## 2024-09-01 DIAGNOSIS — R051 Acute cough: Secondary | ICD-10-CM | POA: Diagnosis not present

## 2024-09-02 ENCOUNTER — Encounter (HOSPITAL_COMMUNITY)

## 2024-09-03 ENCOUNTER — Ambulatory Visit: Attending: Internal Medicine

## 2024-09-03 ENCOUNTER — Telehealth: Payer: Self-pay | Admitting: Internal Medicine

## 2024-09-03 DIAGNOSIS — I442 Atrioventricular block, complete: Secondary | ICD-10-CM

## 2024-09-03 NOTE — Telephone Encounter (Signed)
 Pt received letter to r/s appt with Dr. Mallipeddi for 09/30/24. Daughter said doctor wanted to see him back before Christmas, but I do not see any openings. Please advise

## 2024-09-05 LAB — CUP PACEART REMOTE DEVICE CHECK
Battery Remaining Longevity: 92 mo
Battery Remaining Percentage: 95.5 %
Battery Voltage: 3.02 V
Brady Statistic AP VP Percent: 30 %
Brady Statistic AP VS Percent: 1 %
Brady Statistic AS VP Percent: 36 %
Brady Statistic AS VS Percent: 33 %
Brady Statistic RA Percent Paced: 28 %
Brady Statistic RV Percent Paced: 66 %
Date Time Interrogation Session: 20251126060854
Implantable Lead Connection Status: 753985
Implantable Lead Connection Status: 753985
Implantable Lead Implant Date: 20251003
Implantable Lead Implant Date: 20251003
Implantable Lead Location: 753859
Implantable Lead Location: 753860
Implantable Pulse Generator Implant Date: 20251003
Lead Channel Impedance Value: 410 Ohm
Lead Channel Impedance Value: 480 Ohm
Lead Channel Pacing Threshold Amplitude: 0.5 V
Lead Channel Pacing Threshold Amplitude: 0.625 V
Lead Channel Pacing Threshold Pulse Width: 0.5 ms
Lead Channel Pacing Threshold Pulse Width: 0.5 ms
Lead Channel Sensing Intrinsic Amplitude: 0.7 mV
Lead Channel Sensing Intrinsic Amplitude: 6.7 mV
Lead Channel Setting Pacing Amplitude: 0.875
Lead Channel Setting Pacing Amplitude: 3.5 V
Lead Channel Setting Pacing Pulse Width: 0.5 ms
Lead Channel Setting Sensing Sensitivity: 2 mV
Pulse Gen Model: 2272
Pulse Gen Serial Number: 8302328

## 2024-09-08 NOTE — Progress Notes (Signed)
 Remote PPM Transmission

## 2024-09-09 ENCOUNTER — Ambulatory Visit (HOSPITAL_COMMUNITY)

## 2024-09-10 ENCOUNTER — Encounter (HOSPITAL_COMMUNITY): Payer: Self-pay | Admitting: *Deleted

## 2024-09-10 DIAGNOSIS — Z953 Presence of xenogenic heart valve: Secondary | ICD-10-CM

## 2024-09-10 NOTE — Progress Notes (Signed)
 Cardiac Individual Treatment Plan  Patient Details  Name: Ryan [name] MRN: [id number] Date of Birth: Aug 27, 1951 Referring Provider:   Flowsheet Row CARDIAC REHAB PHASE II ORIENTATION from 08/06/2024 in [facility] CARDIAC REHABILITATION  Referring Provider Stacia Beauvais MD    Initial Encounter Date:  Flowsheet Row CARDIAC REHAB PHASE II ORIENTATION from 08/06/2024 in [facility] CARDIAC REHABILITATION  Date 08/06/24    Visit Diagnosis: Status post transcatheter aortic valve replacement (TAVR) using bioprosthesis  Patient's Home Medications on Admission:  Current Outpatient Medications:    acetaminophen  (TYLENOL ) 500 MG tablet, Take 500 mg by mouth every 6 (six) hours as needed for mild pain. , Disp: , Rfl:    albuterol  (VENTOLIN  HFA) 108 (90 Base) MCG/ACT inhaler, Inhale 2 puffs into the lungs as needed for wheezing or shortness of breath., Disp: 18 each, Rfl: 2   ALPRAZolam  (XANAX ) 1 MG tablet, Take 1 mg by mouth at bedtime., Disp: , Rfl:    apixaban  (ELIQUIS ) 5 MG TABS tablet, Take 1 tablet (5 mg total) by mouth 2 (two) times daily., Disp: 60 tablet, Rfl: 5   Artificial Tear Solution (SOOTHE XP OP), Place 1 drop into both eyes daily as needed (dry eyes)., Disp: , Rfl:    busPIRone  (BUSPAR ) 5 MG tablet, Take 5 mg by mouth daily., Disp: , Rfl:    Cholecalciferol (VITAMIN D3) 50 MCG (2000 UT) capsule, Take 2,000 Units by mouth daily., Disp: , Rfl:    citalopram  (CELEXA ) 20 MG tablet, Take 40 mg by mouth every evening., Disp: , Rfl:    docusate sodium  (COLACE) 100 MG capsule, Take 100 mg by mouth 2 (two) times a week., Disp: , Rfl:    famotidine  (PEPCID ) 20 MG tablet, One after supper, Disp: 30 tablet, Rfl: 11   folic acid  (FOLVITE ) 400 MCG tablet, Take 400 mcg by mouth daily., Disp: , Rfl:    Furosemide  (FUROSCIX ) 80 MG/10ML CTKT, Inject 80 mLs into the skin once as needed for up to 1 dose., Disp: , Rfl:    Homeopathic Products (RESTFUL LEGS SL), Place 1 tablet under the tongue  at bedtime., Disp: , Rfl:    levothyroxine  (SYNTHROID ) 75 MCG tablet, Take 75 mcg by mouth at bedtime., Disp: , Rfl:    Magnesium  500 MG TABS, Take 500 mg by mouth 3 (three) times a week., Disp: , Rfl:    metoprolol  succinate (TOPROL -XL) 25 MG 24 hr tablet, Take 0.5 tablets (12.5 mg total) by mouth 2 (two) times daily., Disp: 45 tablet, Rfl: 1   nitroGLYCERIN  (NITROSTAT ) 0.4 MG SL tablet, Place 1 tablet (0.4 mg total) under the tongue every 5 (five) minutes as needed for chest pain., Disp: 25 tablet, Rfl: 12   OVER THE COUNTER MEDICATION, Take 2 tablets by mouth daily. Ultra kidney complex, Disp: , Rfl:    OVER THE COUNTER MEDICATION, Take 1 tablet by mouth 3 (three) times a week. Herbal laxative otc supplement, Disp: , Rfl:    pantoprazole  (PROTONIX ) 40 MG tablet, Take 1 tablet (40 mg total) by mouth daily. Take 30-60 min before first meal of the day, Disp: 30 tablet, Rfl: 2   pramipexole  (MIRAPEX ) 0.5 MG tablet, Take 0.5 mg by mouth daily., Disp: , Rfl:    QUEtiapine  (SEROQUEL ) 50 MG tablet, Take 1 tablet (50 mg total) by mouth 2 (two) times daily. Take 1/2 tablet at 8am, 1/2 tablet 4pm and 1 tablet at 9pm, Disp: 60 tablet, Rfl: 0   simvastatin  (ZOCOR ) 80 MG tablet, Take 80 mg  by mouth every evening. , Disp: , Rfl:    SYMBICORT  80-4.5 MCG/ACT inhaler, Inhale 2 puffs into the lungs 2 (two) times daily., Disp: , Rfl:    torsemide  (DEMADEX ) 20 MG tablet, Take 3 tablets (60 mg total) by mouth 2 (two) times daily., Disp: 420 tablet, Rfl: 3   vitamin B-12 (CYANOCOBALAMIN ) 500 MCG tablet, Take 500 mcg by mouth daily., Disp: , Rfl:   Past Medical History: Past Medical History:  Diagnosis Date   Adie's pupil    Anemia 07/14/2013   Borderline diabetes    CAD (coronary artery disease), native coronary artery 04/09/2013   Dist LM stenosis 70-80% (at trifurcation into LAD, CX, & 2 Ramus branches, 70% mid RCA);; b) Myoview  08/06/14: Low Risk, no Ischemia/infarction   Depression 1983   after I was dx'd  w/CA; now just comes in spells  (12/22/2015)   Essential hypertension    Exertional shortness of breath    Hodgkin's lymphoma (HCC) 1983   Hypercholesterolemia    Hypothyroidism    Invasive ductal carcinoma of breast, stage 2 (HCC) 04/27/2011   chemo, mastectomy   Left carotid artery partial occlusion 04/27/2011   CAROTIC DOPPLER 11/2013: < 40% R ICA stenosis, distal waveforms damped - suggests probably intracranial occlusoin, < 40% LICA, normal vertebrals -- no change   Mild aortic stenosis by prior echocardiogram 02/2018   By echo June 2020: Mild-Mod AS (Mean Gradient 13.5 mmHg)   Mild mitral stenosis by prior echocardiogram 08/06/2014   Most recent echo June 2020: Estimated valve area 2.83 cm (in 2019 was roughly 2 cm).  Mean gradient 6-8 mmHg.  Appears to be stable.   Personal history of chemotherapy    Pre-diabetes    Restless leg syndrome    Right carotid artery occlusion 04/27/2011   S/P CABG x 3 04/09/2013   LIMA-LAD, fLRAD-RI, SVG-RCA; Echo 10/29/'15:  mild Conc LVH, no WMA, Gr 1 DD, Mod AoV Sclerosis w/ Mod AI, ~Mild-Mod MS    S/P TAVR (transcatheter aortic valve replacement) 07/08/2024   s/p TAVR with a 29 mm Edwards Sapien 3 Ultra Resilia THV via the TF approach by Dr. Wendel and Dr. Paige   Sleep apnea    Stroke [facility])    2007   Thyroid  disease     Tobacco Use: Social History   Tobacco Use  Smoking Status Former   Current packs/day: 0.00   Average packs/day: 1 pack/day for 15.0 years (15.0 ttl pk-yrs)   Types: Cigarettes   Start date: 06/10/1967   Quit date: 06/09/1982   Years since quitting: 42.2  Smokeless Tobacco Never    Labs: Review Flowsheet  More data exists      Latest Ref Rng & Units 04/09/2013 08/22/2019 11/18/2019 05/20/2024 07/13/2024  Labs for ITP Cardiac and Pulmonary Rehab  Cholestrol 100 - 199 mg/dL - 788  858  - -  LDL (calc) 0 - 99 mg/dL - 855  65  - -  HDL-C >60 mg/dL - 53  62  - -  Trlycerides 0 - 149 mg/dL - 80  69  - -  Hemoglobin A1c  4.8 - 5.6 % - - - - 6.1   PH, Arterial 7.35 - 7.45 7.323  7.304  7.346  - - 7.365  -  PCO2 arterial 32 - 48 mmHg 46.5  49.3  47.5  - - 46.6  -  Bicarbonate 20.0 - 28.0 mmol/L 24.1  24.3  25.7  - - 28.7  31.0  28.2  26.6  -  TCO2 22 - 32 mmol/L 26  25  26  27   - - 30  33  30  28  -  Acid-base deficit 0.0 - 2.0 mmol/L 2.0  2.0  - - - -  O2 Saturation % 97.0  95.0  99.0  - - 55  59  57  91  -    Details       Multiple values from one day are sorted in reverse-chronological order         Capillary Blood Glucose: Lab Results  Component Value Date   GLUCAP 120 (H) 07/14/2024   GLUCAP 122 (H) 04/16/2013   GLUCAP 181 (H) 04/16/2013   GLUCAP 114 (H) 04/16/2013   GLUCAP 110 (H) 04/15/2013     Exercise Target Goals: Exercise Program Goal: Individual exercise prescription set using results from initial 6 min walk test and THRR while considering  patient's activity barriers and safety.   Exercise Prescription Goal: Starting with aerobic activity 30 plus minutes a day, 3 days per week for initial exercise prescription. Provide home exercise prescription and guidelines that participant acknowledges understanding prior to discharge.  Activity Barriers & Risk Stratification:  Activity Barriers & Cardiac Risk Stratification - 08/01/24 1335       Activity Barriers & Cardiac Risk Stratification   Activity Barriers Balance Concerns;Shortness of Breath;Arthritis;Deconditioning   Neruopathy bilaterally.   Cardiac Risk Stratification Moderate          6 Minute Walk:  6 Minute Walk     Row Name 08/06/24 1545         6 Minute Walk   Phase Initial     Distance 600 feet     Walk Time 4 minutes     # of Rest Breaks 1     MPH 1.7     METS 1.37     RPE 12     Perceived Dyspnea  3     VO2 Peak 4.8     Symptoms Yes (comment)     Comments one sitting break 2 min due to SOB     Resting HR 63 bpm     Resting BP 100/52     Resting Oxygen Saturation  96 %     Exercise Oxygen Saturation   during 6 min walk 95 %     Max Ex. HR 88 bpm     Max Ex. BP 120/60     2 Minute Post BP 112/60       Interval HR   Interval Heart Rate? Yes        Oxygen Initial Assessment:   Oxygen Re-Evaluation:   Oxygen Discharge (Final Oxygen Re-Evaluation):   Initial Exercise Prescription:  Initial Exercise Prescription - 08/06/24 1500       Date of Initial Exercise RX and Referring Provider   Date 08/06/24    Referring Provider Mallipeddi, Vishnu MD      NuStep   Level 1    SPM 50    Minutes 15    METs 1.9      REL-XR   Level 1    Speed 50    Minutes 15    METs 1.9      Prescription Details   Frequency (times per week) 2    Duration Progress to 30 minutes of continuous aerobic without signs/symptoms of physical distress      Intensity   THRR 40-80% of Max Heartrate 96-129    Ratings of Perceived Exertion 11-13  Perceived Dyspnea 0-4      Resistance Training   Training Prescription Yes    Weight 3    Reps 10-15          Perform Capillary Blood Glucose checks as needed.  Exercise Prescription Changes:   Exercise Prescription Changes     Row Name 08/06/24 1500             Response to Exercise   Blood Pressure (Admit) 100/52       Blood Pressure (Exercise) 120/60       Blood Pressure (Exit) 112/60       Heart Rate (Admit) 63 bpm       Heart Rate (Exercise) 88 bpm       Heart Rate (Exit) 70 bpm       Oxygen Saturation (Admit) 96 %       Oxygen Saturation (Exercise) 95 %       Oxygen Saturation (Exit) 96 %       Rating of Perceived Exertion (Exercise) 12       Perceived Dyspnea (Exercise) 3          Exercise Comments:   Exercise Comments     Row Name 08/06/24 1513 08/07/24 1038         Exercise Comments Patient attend orientation today.  Patient is attending Cardiac Rehabilitation Program.  Documentation for diagnosis can be found in [facility].  Reviewed medical chart, RPE/RPD, gym safety, and program guidelines.  Patient was fitted to  equipment they will be using during rehab.  Patient is scheduled to start exercise on 08/07/24.   Initial ITP created and sent for review and signature by Dr. Dorn Ross, Medical Director for Cardiac Rehabilitation Program. First full day of exercise!  Patient was oriented to gym and equipment including functions, settings, policies, and procedures.  Patient's individual exercise prescription and treatment plan were reviewed.  All starting workloads were established based on the results of the 6 minute walk test done at initial orientation visit.  The plan for exercise progression was also introduced and progression will be customized based on patient's performance and goals.         Exercise Goals and Review:   Exercise Goals     Row Name 08/06/24 1549             Exercise Goals   Increase Physical Activity Yes       Intervention Provide advice, education, support and counseling about physical activity/exercise needs.;Develop an individualized exercise prescription for aerobic and resistive training based on initial evaluation findings, risk stratification, comorbidities and participant's personal goals.       Expected Outcomes Short Term: Attend rehab on a regular basis to increase amount of physical activity.;Long Term: Add in home exercise to make exercise part of routine and to increase amount of physical activity.;Long Term: Exercising regularly at least 3-5 days a week.       Increase Strength and Stamina Yes       Intervention Develop an individualized exercise prescription for aerobic and resistive training based on initial evaluation findings, risk stratification, comorbidities and participant's personal goals.;Provide advice, education, support and counseling about physical activity/exercise needs.       Expected Outcomes Short Term: Perform resistance training exercises routinely during rehab and add in resistance training at home;Short Term: Increase workloads from initial  exercise prescription for resistance, speed, and METs.;Long Term: Improve cardiorespiratory fitness, muscular endurance and strength as measured by increased METs and functional capacity ( )  Able to understand and use rate of perceived exertion (RPE) scale Yes       Intervention Provide education and explanation on how to use RPE scale       Expected Outcomes Short Term: Able to use RPE daily in rehab to express subjective intensity level;Long Term:  Able to use RPE to guide intensity level when exercising independently       Able to understand and use Dyspnea scale Yes       Intervention Provide education and explanation on how to use Dyspnea scale       Expected Outcomes Short Term: Able to use Dyspnea scale daily in rehab to express subjective sense of shortness of breath during exertion;Long Term: Able to use Dyspnea scale to guide intensity level when exercising independently       Knowledge and understanding of Target Heart Rate Range (THRR) Yes       Intervention Provide education and explanation of THRR including how the numbers were predicted and where they are located for reference       Expected Outcomes Short Term: Able to state/look up THRR;Long Term: Able to use THRR to govern intensity when exercising independently;Short Term: Able to use daily as guideline for intensity in rehab       Able to check pulse independently Yes       Intervention Provide education and demonstration on how to check pulse in carotid and radial arteries.;Review the importance of being able to check your own pulse for safety during independent exercise       Expected Outcomes Short Term: Able to explain why pulse checking is important during independent exercise;Long Term: Able to check pulse independently and accurately       Understanding of Exercise Prescription Yes       Intervention Provide education, explanation, and written materials on patient's individual exercise prescription       Expected  Outcomes Short Term: Able to explain program exercise prescription;Long Term: Able to explain home exercise prescription to exercise independently          Exercise Goals Re-Evaluation :  Exercise Goals Re-Evaluation     Row Name 08/07/24 1038             Exercise Goal Re-Evaluation   Exercise Goals Review Able to understand and use Dyspnea scale;Knowledge and understanding of Target Heart Rate Range (THRR);Able to understand and use rate of perceived exertion (RPE) scale;Understanding of Exercise Prescription       Comments Reviewed RPE and dyspnea scale, THR and program prescription with pt today.  Pt voiced understanding and was given a copy of goals to take home.       Expected Outcomes Short: Use RPE daily to regulate intensity.  Long: Follow program prescription in THR.           Discharge Exercise Prescription (Final Exercise Prescription Changes):  Exercise Prescription Changes - 08/06/24 1500       Response to Exercise   Blood Pressure (Admit) 100/52    Blood Pressure (Exercise) 120/60    Blood Pressure (Exit) 112/60    Heart Rate (Admit) 63 bpm    Heart Rate (Exercise) 88 bpm    Heart Rate (Exit) 70 bpm    Oxygen Saturation (Admit) 96 %    Oxygen Saturation (Exercise) 95 %    Oxygen Saturation (Exit) 96 %    Rating of Perceived Exertion (Exercise) 12    Perceived Dyspnea (Exercise) 3  Nutrition:  Target Goals: Understanding of nutrition guidelines, daily intake of sodium 1500mg , cholesterol 200mg , calories 30% from fat and 7% or less from saturated fats, daily to have 5 or more servings of fruits and vegetables.  Biometrics:  Pre Biometrics - 08/06/24 1550       Pre Biometrics   Height 6' 3 (1.905 m)    Weight 213 lb 13.5 oz (97 kg)    Waist Circumference 42 inches    Hip Circumference 43 inches    Waist to Hip Ratio 0.98 %    BMI (Calculated) 26.73    Grip Strength 29.7 kg    Single Leg Stand 3.08 seconds           Nutrition  Therapy Plan and Nutrition Goals:   Nutrition Assessments:  Nutrition Assessments - 08/06/24 1534       Rate Your Plate Scores   Pre Score 59         MEDIFICTS Score Key: >=70 Need to make dietary changes  40-70 Heart Healthy Diet <= 40 Therapeutic Level Cholesterol Diet  Flowsheet Row CARDIAC REHAB PHASE II ORIENTATION from 08/06/2024 in [facility] CARDIAC REHABILITATION  Picture Your Plate Total Score on Admission 59   Picture Your Plate Scores: <59 Unhealthy dietary pattern with much room for improvement. 41-50 Dietary pattern unlikely to meet recommendations for good health and room for improvement. 51-60 More healthful dietary pattern, with some room for improvement.  >60 Healthy dietary pattern, although there may be some specific behaviors that could be improved.    Nutrition Goals Re-Evaluation:   Nutrition Goals Discharge (Final Nutrition Goals Re-Evaluation):   Psychosocial: Target Goals: Acknowledge presence or absence of significant depression and/or stress, maximize coping skills, provide positive support system. Participant is able to verbalize types and ability to use techniques and skills needed for reducing stress and depression.  Initial Review & Psychosocial Screening:  Initial Psych Review & Screening - 08/01/24 1356       Initial Review   Current issues with Current Anxiety/Panic;Current Psychotropic Meds;Current Depression;History of Depression;Current Sleep Concerns      Family Dynamics   Good Support System? Yes    Comments Patient wife and daugher support him.      Barriers   Psychosocial barriers to participate in program The patient should benefit from training in stress management and relaxation.;There are no identifiable barriers or psychosocial needs.      Screening Interventions   Interventions To provide support and resources with identified psychosocial needs;Encouraged to exercise;Provide feedback about the scores to participant     Expected Outcomes Short Term goal: Utilizing psychosocial counselor, staff and physician to assist with identification of specific Stressors or current issues interfering with healing process. Setting desired goal for each stressor or current issue identified.;Long Term Goal: Stressors or current issues are controlled or eliminated.;Short Term goal: Identification and review with participant of any Quality of Life or Depression concerns found by scoring the questionnaire.;Long Term goal: The participant improves quality of Life and PHQ9 Scores as seen by post scores and/or verbalization of changes          Quality of Life Scores:  Quality of Life - 08/06/24 1539       Quality of Life   Select Quality of Life      Quality of Life Scores   Health/Function Pre 16.07 %    Socioeconomic Pre 23.58 %    Psych/Spiritual Pre 24.43 %    Family Pre 22.8 %  GLOBAL Pre 20.36 %         Scores of 19 and below usually indicate a poorer quality of life in these areas.  A difference of  2-3 points is a clinically meaningful difference.  A difference of 2-3 points in the total score of the Quality of Life Index has been associated with significant improvement in overall quality of life, self-image, physical symptoms, and general health in studies assessing change in quality of life.  PHQ-9: Review Flowsheet       08/06/2024 07/31/2014 05/14/2013  Depression screen PHQ 2/9  Decreased Interest 3 0 1  Down, Depressed, Hopeless 2 0 0  PHQ - 2 Score 5 0 1  Altered sleeping 3 - -  Tired, decreased energy 3 - -  Change in appetite 3 - -  Feeling bad or failure about yourself  2 - -  Trouble concentrating 2 - -  Moving slowly or fidgety/restless 2 - -  Suicidal thoughts 2 - -  PHQ-9 Score 22  - -  Difficult doing work/chores Somewhat difficult - -    Details       Data saved with a previous flowsheet row definition        Interpretation of Total Score  Total Score Depression Severity:   1-4 = Minimal depression, 5-9 = Mild depression, 10-14 = Moderate depression, 15-19 = Moderately severe depression, 20-27 = Severe depression   Psychosocial Evaluation and Intervention:  Psychosocial Evaluation - 08/01/24 1356       Psychosocial Evaluation & Interventions   Interventions Stress management education;Relaxation education;Encouraged to exercise with the program and follow exercise prescription    Comments Patient referred to cardiac rehab with S/P TAVR. Spoke with his wife and daugther for virtual call. He has some memory issues that have worsened since his hospitalization. He is going to be screened for dementia by his pcp soon. He has had depression and anxiety long term and is being treated with aprazolam and celexa . He sleeps during the day and is up a lot at night. He has restless leg syndrome which affects his sleep. His daugther says their goals for him are to increase his energy levels and to improve his balance and stability walking. They hope the program will give him energy so he will sleep at night. He participated  in the program in 2016 when he had his CABG. He has no barriers to complete the program.    Expected Outcomes Short Term: Patient will start the program and attend consistently. Long Term: Patient will complete the program meeting personal goals.    Continue Psychosocial Services  Follow up required by staff          Psychosocial Re-Evaluation:   Psychosocial Discharge (Final Psychosocial Re-Evaluation):   Vocational Rehabilitation: Provide vocational rehab assistance to qualifying candidates.   Vocational Rehab Evaluation & Intervention:  Vocational Rehab - 08/01/24 1355       Initial Vocational Rehab Evaluation & Intervention   Assessment shows need for Vocational Rehabilitation No      Vocational Rehab Re-Evaulation   Comments Patient is retired.          Education: Education Goals: Education classes will be provided on a weekly  basis, covering required topics. Participant will state understanding/return demonstration of topics presented.  Learning Barriers/Preferences:  Learning Barriers/Preferences - 08/01/24 1355       Learning Barriers/Preferences   Learning Barriers --   Family reports patient is having memory issues and is being screened for  AD.   Learning Preferences Skilled Demonstration          Education Topics: Hypertension, Hypertension Reduction -Define heart disease and high blood pressure. Discus how high blood pressure affects the body and ways to reduce high blood pressure.   Exercise and Your Heart -Discuss why it is important to exercise, the FITT principles of exercise, normal and abnormal responses to exercise, and how to exercise safely.   Angina -Discuss definition of angina, causes of angina, treatment of angina, and how to decrease risk of having angina.   Cardiac Medications -Review what the following cardiac medications are used for, how they affect the body, and side effects that may occur when taking the medications.  Medications include Aspirin , Beta blockers, calcium  channel blockers, ACE Inhibitors, angiotensin receptor blockers, diuretics, digoxin, and antihyperlipidemics.   Congestive Heart Failure -Discuss the definition of CHF, how to live with CHF, the signs and symptoms of CHF, and how keep track of weight and sodium intake.   Heart Disease and Intimacy -Discus the effect sexual activity has on the heart, how changes occur during intimacy as we age, and safety during sexual activity.   Smoking Cessation / COPD -Discuss different methods to quit smoking, the health benefits of quitting smoking, and the definition of COPD.   Nutrition I: Fats -Discuss the types of cholesterol, what cholesterol does to the heart, and how cholesterol levels can be controlled.   Nutrition II: Labels -Discuss the different components of food labels and how to read food  label   Heart Parts/Heart Disease and PAD -Discuss the anatomy of the heart, the pathway of blood circulation through the heart, and these are affected by heart disease.   Stress I: Signs and Symptoms -Discuss the causes of stress, how stress may lead to anxiety and depression, and ways to limit stress.   Stress II: Relaxation -Discuss different types of relaxation techniques to limit stress.   Warning Signs of Stroke / TIA -Discuss definition of a stroke, what the signs and symptoms are of a stroke, and how to identify when someone is having stroke.   Knowledge Questionnaire Score:  Knowledge Questionnaire Score - 08/07/24 1226       Knowledge Questionnaire Score   Pre Score 18/26          Core Components/Risk Factors/Patient Goals at Admission:  Personal Goals and Risk Factors at Admission - 08/01/24 1356       Core Components/Risk Factors/Patient Goals on Admission    Weight Management Weight Maintenance    Improve shortness of breath with ADL's Yes    Intervention Provide education, individualized exercise plan and daily activity instruction to help decrease symptoms of SOB with activities of daily living.    Expected Outcomes Short Term: Improve cardiorespiratory fitness to achieve a reduction of symptoms when performing ADLs;Long Term: Be able to perform more ADLs without symptoms or delay the onset of symptoms    Diabetes Yes    Intervention Provide education about signs/symptoms and action to take for hypo/hyperglycemia.;Provide education about proper nutrition, including hydration, and aerobic/resistive exercise prescription along with prescribed medications to achieve blood glucose in normal ranges: Fasting glucose 65-99 mg/dL    Expected Outcomes Short Term: Participant verbalizes understanding of the signs/symptoms and immediate care of hyper/hypoglycemia, proper foot care and importance of medication, aerobic/resistive exercise and nutrition plan for blood  glucose control.;Long Term: Attainment of HbA1C < 7%.    Hypertension Yes    Intervention Monitor prescription use compliance.;Provide education on lifestyle  modifcations including regular physical activity/exercise, weight management, moderate sodium restriction and increased consumption of fresh fruit, vegetables, and low fat dairy, alcohol moderation, and smoking cessation.    Expected Outcomes Short Term: Continued assessment and intervention until BP is < 140/8mm HG in hypertensive participants. < 130/34mm HG in hypertensive participants with diabetes, heart failure or chronic kidney disease.;Long Term: Maintenance of blood pressure at goal levels.    Lipids Yes    Intervention Provide education and support for participant on nutrition & aerobic/resistive exercise along with prescribed medications to achieve LDL 70mg , HDL >40mg .    Expected Outcomes Short Term: Participant states understanding of desired cholesterol values and is compliant with medications prescribed. Participant is following exercise prescription and nutrition guidelines.;Long Term: Cholesterol controlled with medications as prescribed, with individualized exercise RX and with personalized nutrition plan. Value goals: LDL < 70mg , HDL > 40 mg.          Core Components/Risk Factors/Patient Goals Review:    Core Components/Risk Factors/Patient Goals at Discharge (Final Review):    ITP Comments:  ITP Comments     Row Name 08/01/24 1417 08/06/24 1513 08/07/24 1037 08/13/24 0855 09/10/24 1243   ITP Comments Virtual orientation visit completed for cardiac rehab with S/P TAVR. On-site orientation visit scheduled for 08/06/24 at 2:30. Patient attend orientation today.  Patient is attending Cardiac Rehabilitation Program.  Documentation for diagnosis can be found in [facility].  Reviewed medical chart, RPE/RPD, gym safety, and program guidelines.  Patient was fitted to equipment they will be using during rehab.  Patient is scheduled to  start exercise on 08/07/24.   Initial ITP created and sent for review and signature by Dr. Dorn Ross, Medical Director for Cardiac Rehabilitation Program. First full day of exercise!  Patient was oriented to gym and equipment including functions, settings, policies, and procedures.  Patient's individual exercise prescription and treatment plan were reviewed.  All starting workloads were established based on the results of the 6 minute walk test done at initial orientation visit.  The plan for exercise progression was also introduced and progression will be customized based on patient's performance and goals. 30 day review completed. ITP sent to Dr. Dorn Ross, Medical Director of Cardiac Rehab. Continue with ITP unless changes are made by physician.  New to the program. Wife stopped by on 08/25/24 and said that Buddy would need to drop for program for medical reasons.      Comments: Discharge ITP

## 2024-09-10 NOTE — Progress Notes (Signed)
 Discharge Progress Report  Patient Details  Name: Ryan [name] MRN: [id number] Date of Birth: 11/13/1950 Referring Provider:   Flowsheet Row CARDIAC REHAB PHASE II ORIENTATION from 08/06/2024 in [facility] CARDIAC RE[facility]  Referring Provider Stacia Beauvais MD     Number of Visits: 7  Reason for Discharge:  Early Exit:  Other Medical concerns  Smoking History:  Social History   Tobacco Use  Smoking Status Former   Current packs/day: 0.00   Average packs/day: 1 pack/day for 15.0 years (15.0 ttl pk-yrs)   Types: Cigarettes   Start date: 06/10/1967   Quit date: 06/09/1982   Years since quitting: 42.2  Smokeless Tobacco Never    Diagnosis:  Status post transcatheter aortic valve replacement (TAVR) using bioprosthesis    Initial Exercise Prescription:  Initial Exercise Prescription - 08/06/24 1500       Date of Initial Exercise RX and Referring Provider   Date 08/06/24    Referring Provider Stacia Beauvais MD      NuStep   Level 1    SPM 50    Minutes 15    METs 1.9      REL-XR   Level 1    Speed 50    Minutes 15    METs 1.9      Prescription Details   Frequency (times per week) 2    Duration Progress to 30 minutes of continuous aerobic without signs/symptoms of physical distress      Intensity   THRR 40-80% of Max Heartrate 96-129    Ratings of Perceived Exertion 11-13    Perceived Dyspnea 0-4      Resistance Training   Training Prescription Yes    Weight 3    Reps 10-15          Discharge Exercise Prescription (Final Exercise Prescription Changes):  Exercise Prescription Changes - 08/06/24 1500       Response to Exercise   Blood Pressure (Admit) 100/52    Blood Pressure (Exercise) 120/60    Blood Pressure (Exit) 112/60    Heart Rate (Admit) 63 bpm    Heart Rate (Exercise) 88 bpm    Heart Rate (Exit) 70 bpm    Oxygen Saturation (Admit) 96 %    Oxygen Saturation (Exercise) 95 %    Oxygen Saturation (Exit) 96 %    Rating of  Perceived Exertion (Exercise) 12    Perceived Dyspnea (Exercise) 3          Functional Capacity:  6 Minute Walk     Row Name 08/06/24 1545         6 Minute Walk   Phase Initial     Distance 600 feet     Walk Time 4 minutes     # of Rest Breaks 1     MPH 1.7     METS 1.37     RPE 12     Perceived Dyspnea  3     VO2 Peak 4.8     Symptoms Yes (comment)     Comments one sitting break 2 min due to SOB     Resting HR 63 bpm     Resting BP 100/52     Resting Oxygen Saturation  96 %     Exercise Oxygen Saturation  during 6 min walk 95 %     Max Ex. HR 88 bpm     Max Ex. BP 120/60     2 Minute Post BP 112/60  Interval HR   Interval Heart Rate? Yes        Psychological, QOL, Others - Outcomes: PHQ 2/9:    08/06/2024    2:50 PM 07/31/2014    9:07 AM 05/14/2013   10:23 AM  Depression screen PHQ 2/9  Decreased Interest 3 0 1  Down, Depressed, Hopeless 2 0 0  PHQ - 2 Score 5 0 1  Altered sleeping 3    Tired, decreased energy 3    Change in appetite 3    Feeling bad or failure about yourself  2    Trouble concentrating 2    Moving slowly or fidgety/restless 2    Suicidal thoughts 2    PHQ-9 Score 22     Difficult doing work/chores Somewhat difficult       Data saved with a previous flowsheet row definition    Quality of Life:  Quality of Life - 08/06/24 1539       Quality of Life   Select Quality of Life      Quality of Life Scores   Health/Function Pre 16.07 %    Socioeconomic Pre 23.58 %    Psych/Spiritual Pre 24.43 %    Family Pre 22.8 %    GLOBAL Pre 20.36 %            Nutrition & Weight - Outcomes:  Pre Biometrics - 08/06/24 1550       Pre Biometrics   Height 6' 3 (1.905 m)    Weight 213 lb 13.5 oz (97 kg)    Waist Circumference 42 inches    Hip Circumference 43 inches    Waist to Hip Ratio 0.98 %    BMI (Calculated) 26.73    Grip Strength 29.7 kg    Single Leg Stand 3.08 seconds

## 2024-09-11 ENCOUNTER — Encounter (HOSPITAL_COMMUNITY)

## 2024-09-15 NOTE — Telephone Encounter (Signed)
 Pt added to 12/23- 4pm

## 2024-09-16 ENCOUNTER — Ambulatory Visit (HOSPITAL_COMMUNITY)

## 2024-09-16 DIAGNOSIS — R413 Other amnesia: Secondary | ICD-10-CM | POA: Diagnosis not present

## 2024-09-16 DIAGNOSIS — F32A Depression, unspecified: Secondary | ICD-10-CM | POA: Diagnosis not present

## 2024-09-16 DIAGNOSIS — F419 Anxiety disorder, unspecified: Secondary | ICD-10-CM | POA: Diagnosis not present

## 2024-09-18 ENCOUNTER — Encounter (HOSPITAL_COMMUNITY)

## 2024-09-22 ENCOUNTER — Other Ambulatory Visit: Payer: Self-pay | Admitting: Physician Assistant

## 2024-09-22 ENCOUNTER — Other Ambulatory Visit: Payer: Self-pay | Admitting: Internal Medicine

## 2024-09-22 DIAGNOSIS — R0609 Other forms of dyspnea: Secondary | ICD-10-CM

## 2024-09-22 MED ORDER — QUETIAPINE FUMARATE 50 MG PO TABS: 50.0000 mg | ORAL_TABLET | ORAL | 3 refills | Status: AC

## 2024-09-23 ENCOUNTER — Encounter (HOSPITAL_COMMUNITY)

## 2024-09-25 ENCOUNTER — Encounter (HOSPITAL_COMMUNITY)

## 2024-09-30 ENCOUNTER — Encounter (HOSPITAL_COMMUNITY)

## 2024-09-30 ENCOUNTER — Encounter: Payer: Self-pay | Admitting: Internal Medicine

## 2024-09-30 ENCOUNTER — Ambulatory Visit: Admitting: Internal Medicine

## 2024-09-30 ENCOUNTER — Ambulatory Visit: Attending: Internal Medicine | Admitting: Internal Medicine

## 2024-09-30 VITALS — BP 136/78 | HR 68 | Ht 75.0 in | Wt 192.0 lb

## 2024-09-30 DIAGNOSIS — N184 Chronic kidney disease, stage 4 (severe): Secondary | ICD-10-CM | POA: Diagnosis present

## 2024-09-30 DIAGNOSIS — I5022 Chronic systolic (congestive) heart failure: Secondary | ICD-10-CM | POA: Diagnosis present

## 2024-09-30 DIAGNOSIS — N179 Acute kidney failure, unspecified: Secondary | ICD-10-CM | POA: Insufficient documentation

## 2024-09-30 NOTE — Progress Notes (Signed)
 "    Cardiology Office Note  Date: 09/30/2024   ID: Ryan [name], DOB 02-03-1951, MRN [id number]  PCP:  Shona Norleen PEDLAR, MD  Cardiologist:  [name], MD Electrophysiologist:  [name], MD   History of Present Illness: Ryan [name] is a 73 y.o. male  Referred to cardiology clinic initially for worsening DOE, angina and exertional dizziness. Echocardiogram revealed LVEF 40 to 45%, mildly reduced RV systolic function, moderate enlargement of RV, severe AS with moderate to severe eccentric AI, severe MS, moderate dilatation of the aortic root 44 mm. RHC/LHC showed severe native vessel CAD, patent bypass grafts, RA pressure 11 mmHg, RV pressure 65/22 with end-diastolic pressure of 10 mmHg, PA pressure 53/36 with a mean of 36 mmHg, wedge pressure mean of 20 mmHg with V wave to 22 mmHg.  He was not a candidate for double valve surgery and hence underwent TAVR.  Post TAVR, he experienced delirium which has not resolved yet.  He also developed severe leg swelling.  He is here today for follow-up visit, accompanied by wife and daughter.  Prior notes/reports reviewed.  Patient reported new onset DOE and bilateral leg swelling since TAVR.  No angina.  Limited echocardiogram obtained after TAVR procedure showed normal aortic prosthesis but RV function is severely reduced and RV size was severely enlarged.  LV function also appeared to be mildly reduced.  Mitral valve was not interrogated well but it appears to be at least moderately stenotic, if not severe.  He was also delirious and started to experience new hallucinations since TAVR.  This is new.  He recently underwent dementia testing and was found to have 2 genes/variants that are strongly positive for dementia.  After starting Seroquel , he is more calmer.  But still has waxing and waning.  Leg swelling completely resolved.  He still has DOE.  Currently taking torsemide  40 mg twice daily.  Past Medical History:  Diagnosis Date    Adie's pupil    Anemia 07/14/2013   Borderline diabetes    CAD (coronary artery disease), native coronary artery 04/09/2013   Dist LM stenosis 70-80% (at trifurcation into LAD, CX, & 2 Ramus branches, 70% mid RCA);; b) Myoview  08/06/14: Low Risk, no Ischemia/infarction   Depression 1983   after I was dx'd w/CA; now just comes in spells  (12/22/2015)   Essential hypertension    Exertional shortness of breath    Hodgkin's lymphoma (HCC) 1983   Hypercholesterolemia    Hypothyroidism    Invasive ductal carcinoma of breast, stage 2 (HCC) 04/27/2011   chemo, mastectomy   Left carotid artery partial occlusion 04/27/2011   CAROTIC DOPPLER 11/2013: < 40% R ICA stenosis, distal waveforms damped - suggests probably intracranial occlusoin, < 40% LICA, normal vertebrals -- no change   Mild aortic stenosis by prior echocardiogram 02/2018   By echo June 2020: Mild-Mod AS (Mean Gradient 13.5 mmHg)   Mild mitral stenosis by prior echocardiogram 08/06/2014   Most recent echo June 2020: Estimated valve area 2.83 cm (in 2019 was roughly 2 cm).  Mean gradient 6-8 mmHg.  Appears to be stable.   Personal history of chemotherapy    Pre-diabetes    Restless leg syndrome    Right carotid artery occlusion 04/27/2011   S/P CABG x 3 04/09/2013   LIMA-LAD, fLRAD-RI, SVG-RCA; Echo 10/29/'15:  mild Conc LVH, no WMA, Gr 1 DD, Mod AoV Sclerosis w/ Mod AI, ~Mild-Mod MS    S/P TAVR (transcatheter aortic valve replacement) 07/08/2024  s/p TAVR with a 29 mm Edwards Sapien 3 Ultra Resilia THV via the TF approach by Dr. Wendel and Dr. Paige   Sleep apnea    Stroke [facility])    2007   Thyroid  disease     Past Surgical History:  Procedure Laterality Date   BIOPSY  09/08/2020   Procedure: BIOPSY;  Surgeon: Golda Claudis PENNER, MD;  Location: [facility];  Service: Endoscopy;;  colon   BIOPSY  12/09/2021   Procedure: BIOPSY;  Surgeon: Eartha Angelia Sieving, MD;  Location: [facility];  Service:  Gastroenterology;;   BREAST BIOPSY Right 2012   CARDIAC CATHETERIZATION  2014   CATARACT EXTRACTION W/PHACO Right 06/21/2015   Procedure: CATARACT EXTRACTION PHACO AND INTRAOCULAR LENS PLACEMENT (IOC);  Surgeon: Cherene Mania, MD;  Location: [facility];  Service: Ophthalmology;  Laterality: Right;  CDE: 7.05   CATARACT EXTRACTION W/PHACO Left 07/01/2015   Procedure: CATARACT EXTRACTION PHACO AND INTRAOCULAR LENS PLACEMENT (IOC);  Surgeon: Cherene Mania, MD;  Location: [facility];  Service: Ophthalmology;  Laterality: Left;  CDE: 7.49   COLONOSCOPY [name] 05/06/2015   Procedure: COLONOSCOPY;  Surgeon: Claudis PENNER Golda, MD;  [facility]: [facility];  Service: Endoscopy;  Laterality: [name];  930   COLONOSCOPY WITH PROPOFOL  [name] 09/08/2020   Procedure: COLONOSCOPY WITH PROPOFOL ;  Surgeon: Golda Claudis PENNER, MD;  [facility];  Service: Endoscopy;  Laterality: [name];  915   CORONARY ARTERY BYPASS GRAFT [name] 04/08/2013   Procedure: CORONARY ARTERY BYPASS GRAFTING (CABG);  Surgeon: Maude Fleeta Ochoa, MD;  Location: [facility] OR;  Service: Open Heart Surgery;  Laterality: [name];  Times 3 using left internal mammary artery; endoscopically harvested right saphenous vein and left radial artery   ESOPHAGOGASTRODUODENOSCOPY (EGD) WITH PROPOFOL  [name] 12/09/2021   Procedure: ESOPHAGOGASTRODUODENOSCOPY (EGD) WITH PROPOFOL ;  Surgeon: Eartha Angelia Sieving, MD;  [facility];  Service: Gastroenterology;  Laterality: [name];  1250 ASA 2   INTRAOPERATIVE TRANSESOPHAGEAL ECHOCARDIOGRAM [name] 04/08/2013   Procedure: INTRAOPERATIVE TRANSESOPHAGEAL ECHOCARDIOGRAM;  Surgeon: Maude Fleeta Ochoa, MD;  Location: [facility] OR;  Service: Open Heart Surgery;  Laterality: [name];   INTRAOPERATIVE TRANSTHORACIC ECHOCARDIOGRAM [name] 07/08/2024   Procedure: ECHOCARDIOGRAM, TRANSTHORACIC;  Surgeon: Wendel Lurena POUR, MD;  Location: [facility] INVAS[facility] CV LAB;  Service: Cardiovascular;  Laterality: [name];   LAPAROSCOPIC APPENDECTOMY [name] 12/23/2015   Procedure: APPENDECTOMY  LAPAROSCOPIC;  Surgeon: Donnice Lima, MD;  Location: [facility] OR;  Service: General;  Laterality: [name];   LEFT HEART CATHETERIZATION WITH CORONARY ANGIOGRAM [name] 04/07/2013   Procedure: LEFT HEART CATHETERIZATION WITH CORONARY ANGIOGRAM;  Surgeon: Alm LELON Clay, MD;  Location: [facility] CATH LAB;  Service: Cardiovascular;  Laterality: [name];   LYMPH NODE BIOPSY Left 1983   neck   MASTECTOMY Right 2012   w/axillary lymph node dissection   NM MYOVIEW  LTD  08/2019    EF 55 to 60%.  LOW RISK.  No ischemia or infarction noted.   NM TREADMILL MYOVIEW  LTD  08/06/2014   Exercise 5:07 min, 6.3 METS; symptoms noted or extreme dyspnea and lightheadedness with some chest tightness. No EKG changes. No ischemia or infarction was normal EF.   PACEMAKER IMPLANT [name] 07/11/2024   Procedure: PACEMAKER IMPLANT;  Surgeon: Kennyth Chew, MD;  Location: [facility] INVASIVE CV LAB;  Service: Cardiovascular;  Laterality: [name];   POLYPECTOMY  09/08/2020   Procedure: POLYPECTOMY;  Surgeon: Golda Claudis PENNER, MD;  [facility];  Service: Endoscopy;;   RADIAL ARTERY HARVEST Left 04/08/2013   Procedure: RADIAL ARTERY HARVEST;  Surgeon:  Maude Fleeta Ochoa, MD;  Location: [facility] OR;  Service: Open Heart Surgery;  Laterality: Left;   RIGHT HEART CATH AND CORONARY ANGIOGRAPHY [name] 05/20/2024   Procedure: RIGHT HEART CATH AND CORONARY ANGIOGRAPHY;  Surgeon: Wendel Lurena POUR, MD;  Location: [facility];  Service: Cardiovascular;  Laterality: [name];   SAVORY DILATION  12/09/2021   Procedure: SAVORY DILATION;  Surgeon: Eartha Angelia Sieving, MD;  [facility];  Service: Gastroenterology;;   TONSILLECTOMY  1974   TRANSTHORACIC ECHOCARDIOGRAM  08/06/2014; 02/2018   a) Nl EF 55-60%. Gr 1 DD-high LVEDP. Mod AoV Sclerosis w/o AS. Mild AI; mild MS - mean grad 7 mmHg @ HR 122 bpm.;; b) Initial: EF 55-60%, GRII DD.  HighLAP/LVEDP.  ? Bicuspid AoV -severely Ca2+.  Mild Ao root dilation.  Severe MAC, mild MS (MVA by P1/2 T & continuity  equation ~2 cm).  Mod RV dilation w/ mild- mod reduced EF.---> Limited w/Defiinity: No RWMA, Mild AS, Mod MS (~mean gradient 8 mmHg   TRANSTHORACIC ECHOCARDIOGRAM  02/2020   Normal LV size and function.  EF 65 to 70% no R WMA.  Mild LVH with basal septum.  GRII DD with elevated LVEDP.  Moderate reduced RV function with mild RV dilation, but normal PA pressures.  Mild LA dilation.  Moderate mitral stenosis, moderate AS & AI.  Mild MR, Mod MS (mean gradient 9 mmHg).  Moderate calcified AS w/ Mod AI -> Mean gradient 17.3 mmHg with peak 32 mm.   TRANSTHORACIC ECHOCARDIOGRAM  03/2019    EF 60-65%, mild concentric LVH. Gr 2 DD. No RWMA. Mild-Mod RV dilation & enlargement. Mild LA dilation. Mod MV Calcification . Mod MS (previously read as mild, but mean gradient is lower than and estimated valve area is higher than last check).  Mild-Mod AS (Mean Gradient 13.5 mmHg)    Current Outpatient Medications  Medication Sig Dispense Refill   acetaminophen  (TYLENOL ) 500 MG tablet Take 500 mg by mouth every 6 (six) hours as needed for mild pain.      albuterol  (VENTOLIN  HFA) 108 (90 Base) MCG/ACT inhaler Inhale 2 puffs into the lungs as needed for wheezing or shortness of breath. 18 each 2   ALPRAZolam  (XANAX ) 1 MG tablet Take 1 mg by mouth at bedtime.     apixaban  (ELIQUIS ) 5 MG TABS tablet Take 1 tablet (5 mg total) by mouth 2 (two) times daily. 60 tablet 5   Artificial Tear Solution (SOOTHE XP OP) Place 1 drop into both eyes daily as needed (dry eyes).     busPIRone  (BUSPAR ) 5 MG tablet Take 5 mg by mouth daily.     Cholecalciferol (VITAMIN D3) 50 MCG (2000 UT) capsule Take 2,000 Units by mouth daily.     citalopram  (CELEXA ) 20 MG tablet Take 40 mg by mouth every evening.     docusate sodium  (COLACE) 100 MG capsule Take 100 mg by mouth 2 (two) times a week.     famotidine  (PEPCID ) 20 MG tablet One after supper 30 tablet 11   folic acid  (FOLVITE ) 400 MCG tablet Take 400 mcg by mouth daily.     Homeopathic  Products (RESTFUL LEGS SL) Place 1 tablet under the tongue at bedtime.     levothyroxine  (SYNTHROID ) 75 MCG tablet Take 75 mcg by mouth at bedtime.     Magnesium  500 MG TABS Take 500 mg by mouth 3 (three) times a week.     metoprolol  succinate (TOPROL -XL) 25 MG 24 hr tablet Take 0.5 tablets (  12.5 mg total) by mouth 2 (two) times daily. 45 tablet 1   nitroGLYCERIN  (NITROSTAT ) 0.4 MG SL tablet Place 1 tablet (0.4 mg total) under the tongue every 5 (five) minutes as needed for chest pain. 25 tablet 12   OVER THE COUNTER MEDICATION Take 2 tablets by mouth daily. Ultra kidney complex     OVER THE COUNTER MEDICATION Take 1 tablet by mouth 3 (three) times a week. Herbal laxative otc supplement     pantoprazole  (PROTONIX ) 40 MG tablet TAKE 1 TABLET BY MOUTH ONCE DAILY 30 TO 60 MINUTES BEFORE FIRST MEAL OF THE DAY 30 tablet 0   pramipexole  (MIRAPEX ) 0.5 MG tablet Take 0.5 mg by mouth daily.     QUEtiapine  (SEROQUEL ) 50 MG tablet Take 1 tablet (50 mg total) by mouth as directed. Take 1 tablet at 8am, 1 tablet 4pm and 2 tablets tablet at 9pm 120 tablet 3   simvastatin  (ZOCOR ) 80 MG tablet Take 80 mg by mouth every evening.      SYMBICORT  80-4.5 MCG/ACT inhaler Inhale 2 puffs into the lungs 2 (two) times daily.     torsemide  (DEMADEX ) 20 MG tablet Take 3 tablets (60 mg total) by mouth 2 (two) times daily. 420 tablet 3   vitamin B-12 (CYANOCOBALAMIN ) 500 MCG tablet Take 500 mcg by mouth daily.     No current facility-administered medications for this visit.   Allergies:  Patient has no known allergies.   Social History: The patient  reports that he quit smoking about 42 years ago. His smoking use included cigarettes. He started smoking about 57 years ago. He has a 15 pack-year smoking history. He has never used smokeless tobacco. He reports that he does not drink alcohol and does not use drugs.   Family History: The patient's family history includes Alzheimer's disease in his mother; Breast cancer in his  paternal aunt; Diabetes in his mother, paternal grandfather, and paternal grandmother; Hyperlipidemia in his mother; Hypertension in his father and mother; Ovarian cancer in his paternal aunt; Stroke in his mother.   ROS:  Please see the history of present illness. Otherwise, complete review of systems is positive for none  All other systems are reviewed and negative.   Physical Exam: VS:  Ht 6' 3 (1.905 m)   Wt 192 lb (87.1 kg)   BMI 24.00 kg/m , BMI Body mass index is 24 kg/m.  Wt Readings from Last 3 Encounters:  09/30/24 192 lb (87.1 kg)  08/22/24 202 lb 9.6 oz (91.9 kg)  08/12/24 209 lb (94.8 kg)    General: Patient appears comfortable at rest. HEENT: Conjunctiva and lids normal, oropharynx clear with moist mucosa. Neck: Supple, no elevated JVP or carotid bruits, no thyromegaly. Lungs: Clear to auscultation, nonlabored breathing at rest. Cardiac: S1-S2 present Abdomen: Soft, nontender, no hepatomegaly, bowel sounds present, no guarding or rebound. Extremities: 2-3+ pitting edema in bilateral lower EXTR's. Skin: Warm and dry. Musculoskeletal: No kyphosis. Neuropsychiatric: Alert and oriented x3, affect grossly appropriate.  Recent Labwork: 12/19/2023: BNP 193.1; TSH 5.320 07/11/2024: Magnesium  2.0 08/11/2024: ALT 27; AST 37; Hemoglobin 11.5; Platelets 160 08/25/2024: Pro Brain Natriuretic Peptide 2,872.0 08/29/2024: BUN 34; Creatinine, Ser 2.38; Potassium 3.7; Sodium 143     Component Value Date/Time   CHOL 141 11/18/2019 0925   TRIG 69 11/18/2019 0925   HDL 62 11/18/2019 0925   CHOLHDL 2.3 11/18/2019 0925   CHOLHDL 5.0 03/22/2011 1630   VLDL 39 03/22/2011 1630   LDLCALC 65 11/18/2019 0925  Assessment and Plan:   Chronic systolic and diastolic heart failure Biventricular heart failure Severe aortic valve stenosis s/p TAVR c/w trifascicular block s/p PPM Severe mitral stenosis from MAC c/w RV failure Dementia with auditory/visual hallucinations - Patient  underwent TAVR on 07/08/2024 (he was not a candidate for double valve surgery due to comorbidities and also he underwent radiation therapy for Hodgkin's lymphoma in the past).  He reported new onset of DOE and bilateral leg swelling since discharge from the hospital.  He reported that he did not notice a difference in his symptoms after the procedure.  With torsemide  40 mg twice daily, his leg swelling completely resolved and DOE remain the same.  Will continue current dose of torsemide  40 mg twice daily.  He serum creatinine has been gradually uptrending, will obtain a BMP. - Limited echocardiogram from 08/20/2024 showed mildly reduced LV function, severe RV systolic dysfunction, severe RV enlargement, normal TAVR prosthesis, at least moderately stenotic mitral valve, if not more.  Prior echocardiogram showed severe mitral valve stenosis from severe mitral annular calcification. - Delirium improved with Seroquel  but he still has waxing and waning of mental status.  Has hallucinations excetra. - Due to new onset of dementia with hallucinations, patient is not a candidate for any invasive cardiac procedures.  Recommend medical management.  Refer to palliative care month ago. - Dental cleanings twice per year and SBE prophylaxis prior to any dental procedures due to TAVR prosthesis in place. - Follows with electrophysiology for PPM device checks.  Postop atrial fibrillation - Started on Eliquis  5 mg twice daily.  Repeat EKG AV dual paced rhythm on 08/11/2024.  CAD s/p CABG (LIMA to LAD, SVG to RCA, L radial to ramus) in 2014 - LHC showed patent grafts. - Not on aspirin  due to Eliquis  use. - Continue simvastatin  80 mg nightly.  Carotid artery stenosis - Occluded R ICA and mild to moderate L ICA stenosis.  Follows up with vascular surgery.  HLD, unknown values - Continue simvastatin  80 mg nightly.  Goal LDL less than 55.  30 minutes spent in reviewing prior medical records, reports, more than 3 labs,  discussion and documentation Medication Adjustments/Labs and Tests Ordered: Current medicines are reviewed at length with the patient today.  Concerns regarding medicines are outlined above.    Disposition:  Follow up 3 months  Signed Shiann Kam Priya Ioannis Schuh, MD, 09/30/2024 4:01 PM    [facility] [address] [place], [place], [place] [address]  "

## 2024-09-30 NOTE — Patient Instructions (Signed)
 Medication Instructions:   Your physician recommends that you continue on your current medications as directed. Please refer to the Current Medication list given to you today.   Labwork: BMET  Testing/Procedures: None today  Follow-Up: Keep appointment 01/01/25 at 9 am in the [place] office with Dr.Mallipeddi   Any Other Special Instructions Will Be Listed Below (If Applicable).  If you need a refill on your cardiac medications before your next appointment, please call your pharmacy.

## 2024-10-03 DIAGNOSIS — N179 Acute kidney failure, unspecified: Secondary | ICD-10-CM | POA: Insufficient documentation

## 2024-10-03 DIAGNOSIS — I5022 Chronic systolic (congestive) heart failure: Secondary | ICD-10-CM | POA: Insufficient documentation

## 2024-10-05 ENCOUNTER — Other Ambulatory Visit: Payer: Self-pay | Admitting: Student

## 2024-10-06 ENCOUNTER — Other Ambulatory Visit (HOSPITAL_COMMUNITY)
Admission: RE | Admit: 2024-10-06 | Discharge: 2024-10-06 | Disposition: A | Discharge status: Home / Self Care | Source: Ambulatory Visit | Attending: Internal Medicine | Admitting: Internal Medicine

## 2024-10-06 ENCOUNTER — Ambulatory Visit: Payer: Self-pay | Admitting: Internal Medicine

## 2024-10-06 DIAGNOSIS — N184 Chronic kidney disease, stage 4 (severe): Secondary | ICD-10-CM | POA: Diagnosis present

## 2024-10-06 LAB — BASIC METABOLIC PANEL WITH GFR
Anion gap: 12 (ref 5–15)
BUN: 32 mg/dL — ABNORMAL HIGH (ref 8–23)
CO2: 30 mmol/L (ref 22–32)
Calcium: 9.2 mg/dL (ref 8.9–10.3)
Chloride: 99 mmol/L (ref 98–111)
Creatinine, Ser: 1.97 mg/dL — ABNORMAL HIGH (ref 0.61–1.24)
GFR, Estimated: 35 mL/min — ABNORMAL LOW
Glucose, Bld: 312 mg/dL — ABNORMAL HIGH (ref 70–99)
Potassium: 3.5 mmol/L (ref 3.5–5.1)
Sodium: 141 mmol/L (ref 135–145)

## 2024-10-07 ENCOUNTER — Ambulatory Visit (INDEPENDENT_AMBULATORY_CARE_PROVIDER_SITE_OTHER): Admitting: Internal Medicine

## 2024-10-07 ENCOUNTER — Encounter: Payer: Self-pay | Admitting: Internal Medicine

## 2024-10-07 ENCOUNTER — Ambulatory Visit (HOSPITAL_COMMUNITY)

## 2024-10-07 VITALS — BP 96/54 | HR 63 | Ht 75.0 in | Wt 198.6 lb

## 2024-10-07 DIAGNOSIS — Z87891 Personal history of nicotine dependence: Secondary | ICD-10-CM | POA: Diagnosis not present

## 2024-10-07 DIAGNOSIS — R0609 Other forms of dyspnea: Secondary | ICD-10-CM

## 2024-10-07 MED ORDER — IPRATROPIUM-ALBUTEROL 0.5-2.5 (3) MG/3ML IN SOLN: 3.0000 mL | RESPIRATORY_TRACT | Status: AC | PRN

## 2024-10-07 NOTE — Patient Instructions (Addendum)
 Change duoneb (albuterol  / ipatropium)  up to  4 hours if needed    Please schedule a follow up visit in 3 months but call sooner if needed - bring all respiratory medications/ devices.

## 2024-10-07 NOTE — Progress Notes (Unsigned)
 "   Ryan [name], male    DOB: Feb 24, 1951    MRN: [id number]   Brief patient profile:  73  yowm  quit smoking 1983 at dx of Hodgins Dz self-  referred to pulmonary clinic in [place]  12/19/2023 for doe.   S/p R breast ca 2012 > tamoxifen / no RT   S/p CABG  2014 > rehab 90% > [facility] >  [facility]   PFTs  03/12/15  no obst    History of Present Illness  12/19/2023  Pulmonary/ 1st [name] eval/ Ryan [name] / Fest[facility] [name]  Chief Complaint  Patient presents with   Consult    Self referral for SOB    Dyspnea:  walking to shop is slt incline back to house maybe 100 ft stops at  least once or twice  Cough: some am green x months  Sleep: starts in bed but then recliner x one year  x cpap  SABA use: none  02: none / does not check with  Sob once a day at rest 5 -15 min   Rec GERD diet reviewed, bed blocks rec  Make sure you check your oxygen saturation at your highest level of activity(NOT after you stop)  to be sure it stays over 90%   Stiolto 2 puffs each am trial basis - call for prescription if better walking to shop and back Augmentin  875 mg take one pill twice daily  X 10 days   Please schedule a follow up [name] visit in 2 weeks, sooner if needed   - Allergy screen 12/19/23  >  Eos 0.3/  IgE  184 - Sniff 01/02/2024   R phrenic nerve palsy     01/03/2024  f/u ov/Ryan [name]/Ryan [name] re: doe /cough  maint on stiolto  / gerd rx  Chief Complaint  Patient presents with   Follow-up    DOE  and discuss results of his PFT , 2 week follow up   Dyspnea:  it's easier to walk  Cough: much less now  Sleeping: still in recliner 45 degrees no cpap  SABA use: none  02: none  Rec See your cardiologist - no immediate concern but you need follow up  Ok to try off acid suppression after a full month to see what if any symptoms recur but cough / short of breath recur  and if so resume it.  Ok to try off stiolto but if worse breathing restart    PFT's  02/07/24  FEV1 1.80 (47 % ) ratio 0.70  p 36  % improvement from saba p 0 prior to study with DLCO  12.36 (42% %)   and FV curve classically concave      04/10/2024  f/u ov/[place] [name]/Ryan [name] re: AB/ R phrenic nerve paralysis  maint on symbicort  80   Chief Complaint  Patient presents with   Follow-up    DOE   Dyspnea:  house to shop 100 ft downhill and back s stopping now (improved)  Cough: worse when sleeps in bed vs recliner, non productive  SABA use: rarely  02: none  Rec Work on inhaler technique:   SABRA  Make sure you check your oxygen saturation at your highest level of activity(NOT after you stop)  to be sure it stays over 90%  Please schedule a follow up visit in 6  months but call sooner if needed  Add:  did not realize he had stopped PPI ? Related to new upper airway symptoms > if not better with  spacer rec Pantoprazole  (protonix ) 40 mg   Take  30-60 min before first meal of the day and Pepcid  (famotidine )  20 mg after supper x 6 week trial then ENT eval if not better    Prolonged admit for TAVR/ pacemaker complicated by fluid overload/ delirium    08/12/2024   ACUTE ov/Ryan [name]/Ryan [name] re: AB/R phrenic nerve paralysis  maint on Symbicort   80  with spacer but not using it  Chief Complaint  Patient presents with   Acute Visit    Shob / in hsp gave him breo instead of Symbicort  now causing problems hard to catch up  Dyspnea:  improving since d/c along with leg swelling  Cough: none but voice quite raspy  Sleeping: able to lie flatter p diuresis in ER one day prior to OV  s  resp cc  SABA use: albuterol  none  on day of ov  02: none Patient Instructions  If trouble lying down in bed please use your recliner  Symbicort  80 Take 2 puffs first thing in am and then another 2 puffs about 12 hours later.  Work on inhaler technique:  Keep the follow up appt in December - call sooner if needed   URI in Dec 2025    10/07/2024  f/u ov/[place] [name]/Ryan [name] re:  AB/R phrenic nerve paralysis maint on Symbicort  80  Chief  Complaint  Patient presents with   Shortness of Breath    Started neb once a day - was sick 4 weeks ago doing better  Mucus hard to move since he has gotten sick - has been using otc mucus relief   Dyspnea:  mainly going out to doctors offices but no shopping yet  Cough: rattling is some better p abx/ pred per Ryan [name]  Sleeping: bed is flat / one wedge pillow s  resp cc  sleeps better on side  SABA use: new neb q pm  02: none     No obvious day to day or daytime variability or assoc   mucus plugs or hemoptysis or cp or chest tightness, subjective wheeze or overt   hb symptoms.    Also denies any obvious fluctuation of symptoms with weather or environmental changes or other aggravating or alleviating factors except as outlined above   No unusual exposure hx or h/o childhood pna/ asthma or knowledge of premature birth.  Current Allergies, Complete Past Medical History, Past Surgical History, Family History, and Social History were reviewed in [name].  ROS  The following are not active complaints unless bolded Hoarseness, sore throat, dysphagia, dental problems, itching, sneezing,  nasal congestion or discharge of excess mucus or purulent secretions, ear ache,   fever, chills, sweats, unintended wt loss or wt gain, classically pleuritic or exertional cp,  orthopnea pnd or arm/hand swelling  or leg swelling, presyncope, palpitations, abdominal pain, anorexia, nausea, vomiting, diarrhea  or change in bowel habits or change in bladder habits, change in stools or change in urine, dysuria, hematuria,  rash, arthralgias, visual complaints, headache, numbness, weakness or ataxia or problems with walking or coordination,  change in mood or  memory.         Outpatient Medications Prior to Visit  Medication Sig Dispense Refill   acetaminophen  (TYLENOL ) 500 MG tablet Take 500 mg by mouth every 6 (six) hours as needed for mild pain.      albuterol  (VENTOLIN  HFA)  108 (90 Base) MCG/ACT inhaler Inhale 2 puffs into the lungs as needed for wheezing  or shortness of breath. 18 each 2   ALPRAZolam  (XANAX ) 1 MG tablet Take 1 mg by mouth at bedtime.     apixaban  (ELIQUIS ) 5 MG TABS tablet Take 1 tablet (5 mg total) by mouth 2 (two) times daily. 60 tablet 5   Artificial Tear Solution (SOOTHE XP OP) Place 1 drop into both eyes daily as needed (dry eyes).     busPIRone  (BUSPAR ) 5 MG tablet Take 5 mg by mouth daily.     Cholecalciferol (VITAMIN D3) 50 MCG (2000 UT) capsule Take 2,000 Units by mouth daily.     citalopram  (CELEXA ) 20 MG tablet Take 40 mg by mouth every evening.     docusate sodium  (COLACE) 100 MG capsule Take 100 mg by mouth 2 (two) times a week.     famotidine  (PEPCID ) 20 MG tablet One after supper 30 tablet 11   folic acid  (FOLVITE ) 400 MCG tablet Take 400 mcg by mouth daily.     Homeopathic Products (RESTFUL LEGS SL) Place 1 tablet under the tongue at bedtime.     levothyroxine  (SYNTHROID ) 75 MCG tablet Take 75 mcg by mouth at bedtime.     Magnesium  500 MG TABS Take 500 mg by mouth 3 (three) times a week.     metoprolol  succinate (TOPROL -XL) 25 MG 24 hr tablet Take 1/2 (one-half) tablet by mouth twice daily 90 tablet 3   nitroGLYCERIN  (NITROSTAT ) 0.4 MG SL tablet Place 1 tablet (0.4 mg total) under the tongue every 5 (five) minutes as needed for chest pain. 25 tablet 12   OVER THE COUNTER MEDICATION Take 2 tablets by mouth daily. Ultra kidney complex     OVER THE COUNTER MEDICATION Take 1 tablet by mouth 3 (three) times a week. Herbal laxative otc supplement     pantoprazole  (PROTONIX ) 40 MG tablet TAKE 1 TABLET BY MOUTH ONCE DAILY 30 TO 60 MINUTES BEFORE FIRST MEAL OF THE DAY 30 tablet 0   pramipexole  (MIRAPEX ) 0.5 MG tablet Take 0.5 mg by mouth daily.     QUEtiapine  (SEROQUEL ) 50 MG tablet Take 1 tablet (50 mg total) by mouth as directed. Take 1 tablet at 8am, 1 tablet 4pm and 2 tablets tablet at 9pm (Patient taking differently: Take 50 mg by mouth  as directed. 1 tablet 3pm and 2 tablets tablet at 9pm) 120 tablet 3   simvastatin  (ZOCOR ) 80 MG tablet Take 80 mg by mouth every evening.      SYMBICORT  80-4.5 MCG/ACT inhaler Inhale 2 puffs into the lungs 2 (two) times daily.     torsemide  (DEMADEX ) 20 MG tablet Take 40 mg by mouth 2 (two) times daily.     vitamin B-12 (CYANOCOBALAMIN ) 500 MCG tablet Take 500 mcg by mouth daily.     No facility-administered medications prior to visit.          Past Medical History:  Diagnosis Date   Adie's pupil    Anemia 07/14/2013   Borderline diabetes    CAD (coronary artery disease), native coronary artery 04/09/2013   Dist LM stenosis 70-80% (at trifurcation into LAD, CX, & 2 Ramus branches, 70% mid RCA);; b) Myoview  08/06/14: Low Risk, no Ischemia/infarction   Depression 1983   after I was dx'd w/CA; now just comes in spells  (12/22/2015)   Essential hypertension    Exertional shortness of breath    Hodgkin's lymphoma (HCC) 1983   Hypercholesterolemia    Hypothyroidism    Invasive ductal carcinoma of breast, stage 2 (HCC) 04/27/2011   chemo,  mastectomy   Left carotid artery partial occlusion 04/27/2011   CAROTIC DOPPLER 11/2013: < 40% R ICA stenosis, distal waveforms damped - suggests probably intracranial occlusoin, < 40% LICA, normal vertebrals -- no change   Mild aortic stenosis by prior echocardiogram 02/2018   By echo June 2020: Mild-Mod AS (Mean Gradient 13.5 mmHg)   Mild mitral stenosis by prior echocardiogram 08/06/2014   Most recent echo June 2020: Estimated valve area 2.83 cm (in 2019 was roughly 2 cm).  Mean gradient 6-8 mmHg.  Appears to be stable.   Personal history of chemotherapy    Pre-diabetes    Restless leg syndrome    Right carotid artery occlusion 04/27/2011   S/P CABG x 3 04/09/2013   LIMA-LAD, fLRAD-RI, SVG-RCA; Echo 10/29/'15:  mild Conc LVH, no WMA, Gr 1 DD, Mod AoV Sclerosis w/ Mod AI, ~Mild-Mod MS    Sleep apnea    Stroke (HCC)    2007   Thyroid  disease        Objective:    Wts  10/07/2024    198  08/12/2024      209  04/10/2024         205  01/03/2024       210   12/28/23 210 lb 6.4 oz (95.4 kg)  12/19/23 209 lb 6.4 oz (95 kg)  02/06/23 193 lb 5.5 oz (87.7 kg)    Vital signs reviewed  10/07/2024  - Note at rest 02 sats  97% on RA   General appearance:    somber stoic amb wm/ lets daughter do the talking and leads him by the hand  when he walks thru the [name] due to neuropthy/ unsteady on his feet       Decrease bs R base    HEENT : Oropharynx  clear / edentulous     Nasal turbinates nl    NECK :  without  apparent JVD/ palpable Nodes/TM    LUNGS: no acc muscle use,  Nl contour chest  without cough on insp or exp maneuvers/ decreased bs R Base    CV:  RRR  no s3  2/6 SEM  increase in P2, and no edema   ABD:  soft and nontender   MS:    ext warm without deformities Or obvious joint restrictions  calf tenderness, cyanosis or clubbing    SKIN: warm and dry without lesions    NEURO:  alert, approp, nl sensorium with  no motor or cerebellar deficits apparent.    Assessment     Assessment & Plan DOE (dyspnea on exertion) Onset around 2012 > CABG but never back to baseline  - PFTs  2016 s obstruction but reported response  to albuterol  inhaler - Echo 02/18/20   1. Left ventricular ejection fraction, by estimation, is 65 to 70%. The  left ventricle has normal function. The left ventricle has no regional  wall motion abnormalities. There is mild left ventricular hypertrophy of  the basal-septal segment. LV diastolic parameters are consistent with Grade II diastolic  dysfunction (pseudonormalization). Elevated left ventricular end-diastolic  pressure.   2. Right ventricular systolic function is moderately reduced. The right  ventricular size is mildly enlarged. There is normal pulmonary artery  systolic pressure.   3. Left atrial size was mildly dilated.   4. The mitral valve is degenerative. Mild mitral valve  regurgitation.  Moderate mitral stenosis.   5. The aortic valve is tricuspid. Aortic valve regurgitation is moderate.  Moderate aortic valve stenosis  -  Allergy screen 12/19/23  >  Eos 0.3/  IgE  184 - Sniff 01/02/2024   R phrenic nerve palsy  - 01/03/2024  After extensive coaching inhaler device,  effectiveness =    80% DPI  > try off stiolto and compare to taking it  - 01/03/2024   Walked on RA   x  1   lap(s) =  approx 150  ft  @ slow pace, stopped due to felt light headed  with lowest 02 sats 94% s sob   - PFT's  02/07/24  FEV1 1.80 (47 % ) ratio 0.70  p 36 % improvement from saba p 0 prior to study with DLCO  12.36 (42% %)   and FV curve classically concave   - Echo 04/03/24  Mod AS/Severe MS with LAE still mild  - 04/10/2024  After extensive coaching inhaler device,  effectiveness =    80% with spacer/ hfa > continue symb 80 2bid via spacer  - s/p TAVR  07/08/24  - 08/12/2024  After extensive coaching inhaler device,  effectiveness =    75% with spacer only   Back near baseline at present but struggling to use symbicort  s help of daughter / spacer so added duoneb uip to qid prn for AB component.  Appears to have significant geriatric decline and much less active with conditioning likely outcome if can't turn this around but I don't believe airflow obstruction or R HD dysfunction are his limiting factors so pulmonary f/u can be  q 3 m, sooner prn, with no further escalation of meds planned          Each maintenance medication was reviewed in detail including emphasizing most importantly the difference between maintenance and prns and under what circumstances the prns are to be triggered using an action plan format where appropriate.  Total time for H and P, chart review, counseling, reviewing hfa/spacer/ neb  device(s) and generating customized AVS unique to this [name] visit / same day charting = 25 min          AVS  Patient Instructions  Change duoneb (albuterol  / ipatropium)  up to  4  hours if needed    Please schedule a follow up visit in 3 months but call sooner if needed - bring all respiratory medications/ devices.   Ozell America, MD 10/09/2024                                "

## 2024-10-09 ENCOUNTER — Encounter (HOSPITAL_COMMUNITY)

## 2024-10-09 NOTE — Assessment & Plan Note (Addendum)
 Onset around 2012 > CABG but never back to baseline  - PFTs  2016 s obstruction but reported response  to albuterol  inhaler - Echo 02/18/20   1. Left ventricular ejection fraction, by estimation, is 65 to 70%. The  left ventricle has normal function. The left ventricle has no regional  wall motion abnormalities. There is mild left ventricular hypertrophy of  the basal-septal segment. LV diastolic parameters are consistent with Grade II diastolic  dysfunction (pseudonormalization). Elevated left ventricular end-diastolic  pressure.   2. Right ventricular systolic function is moderately reduced. The right  ventricular size is mildly enlarged. There is normal pulmonary artery  systolic pressure.   3. Left atrial size was mildly dilated.   4. The mitral valve is degenerative. Mild mitral valve regurgitation.  Moderate mitral stenosis.   5. The aortic valve is tricuspid. Aortic valve regurgitation is moderate.  Moderate aortic valve stenosis  - Allergy screen 12/19/23  >  Eos 0.3/  IgE  184 - Sniff 01/02/2024   R phrenic nerve palsy  - 01/03/2024  After extensive coaching inhaler device,  effectiveness =    80% DPI  > try off stiolto and compare to taking it  - 01/03/2024   Walked on RA   x  1   lap(s) =  approx 150  ft  @ slow pace, stopped due to felt light headed  with lowest 02 sats 94% s sob   - PFT's  02/07/24  FEV1 1.80 (47 % ) ratio 0.70  p 36 % improvement from saba p 0 prior to study with DLCO  12.36 (42% %)   and FV curve classically concave   - Echo 04/03/24  Mod AS/Severe MS with LAE still mild  - 04/10/2024  After extensive coaching inhaler device,  effectiveness =    80% with spacer/ hfa > continue symb 80 2bid via spacer  - s/p TAVR  07/08/24  - 08/12/2024  After extensive coaching inhaler device,  effectiveness =    75% with spacer only   Back near baseline at present but struggling to use symbicort  s help of daughter / spacer so added duoneb uip to qid prn for AB component.  Appears to  have significant geriatric decline and much less active with conditioning likely outcome if can't turn this around but I don't believe airflow obstruction or R HD dysfunction are his limiting factors so pulmonary f/u can be  q 3 m, sooner prn, with no further escalation of meds planned          Each maintenance medication was reviewed in detail including emphasizing most importantly the difference between maintenance and prns and under what circumstances the prns are to be triggered using an action plan format where appropriate.  Total time for H and P, chart review, counseling, reviewing hfa/spacer/ neb  device(s) and generating customized AVS unique to this office visit / same day charting = 25 min

## 2024-10-13 ENCOUNTER — Telehealth: Payer: Self-pay | Admitting: Physician Assistant

## 2024-10-13 NOTE — Telephone Encounter (Signed)
" °  [facility]   MULTIDISCIPLINARY HEART VALVE TEAM   I received a message from [place] from palliative care copied below.    [name]  I am Ryan Rigg NP, A palliative care nurse practitioner with [facility] compassionate care serious illness program. Ryan [name] is one of my patients. I just wanted you to be aware that he had a pretty bad fall over the weekend and have some scrapes but thank goodness did not hit his head. They have bought the Eliquis  for this month but I was wondering is it worth the risk with his file risk to continue taking that and how long is the intention for him to take it since his surgery? He also is having a hard time with behaviors with this dementia, which I wonder did he throw a clot or something during his surgery that has caused this change in his cognition. I am working on his medicines, trying to get that controlled. The family is also questioning the need for statin Considering his life expectancy, and my thought is that as soon as we get his behaviors controlled, if he is no better than hospice may be their best bet seeing how they were given a six months or less prognosis, even after the TAVR, what are your thoughts? Call me or text me or simply reply to this message.  Thanks  Ryan [name]   ______________________   I called Ryan who is managing his behavioral disturbance with Depaoke and thinks he has frontotemporal dementia from a possible vascular event after TAVR. He recently fell and family and pt refused to seek care in ER.   I totally agree that a palliative approach to his care is in order. They can feel free to stop Eliquis  and statin at their discretion. He has been having to take extra Lasix  for fluid retention, which is fine. My goal is for him to be as comfortable as possible.   Ryan Hummer PA-C  [facility]   "

## 2024-10-14 ENCOUNTER — Ambulatory Visit: Attending: Cardiology | Admitting: Cardiology

## 2024-10-14 ENCOUNTER — Encounter: Payer: Self-pay | Admitting: Cardiology

## 2024-10-14 ENCOUNTER — Encounter (HOSPITAL_COMMUNITY)

## 2024-10-14 VITALS — BP 150/70 | HR 72 | Ht 75.0 in | Wt 202.9 lb

## 2024-10-14 DIAGNOSIS — Z950 Presence of cardiac pacemaker: Secondary | ICD-10-CM | POA: Diagnosis present

## 2024-10-14 DIAGNOSIS — I442 Atrioventricular block, complete: Secondary | ICD-10-CM | POA: Diagnosis present

## 2024-10-14 DIAGNOSIS — Z952 Presence of prosthetic heart valve: Secondary | ICD-10-CM | POA: Insufficient documentation

## 2024-10-14 NOTE — Patient Instructions (Signed)

## 2024-10-14 NOTE — Progress Notes (Signed)
 " Electrophysiology Office Note:   Date:  10/14/2024  ID:  Ryan [name], DOB [birth year], MRN [id number]  Primary Cardiologist: Vishnu P Mallipeddi, MD Electrophysiologist: [name], MD      History of Present Illness:   Ryan [name] is a 74 y.o. male with h/o Hodgkin's lymphoma (s/p XRT), Breast ca (remote) s/p mastectomy/chemo, HTN, HLD, CKD (IIIa), PVD (carotid dz, distal R ICA occl), CAD (CABG 2014), VHD (severe AS and severe MAC) who is being seen today for post implant follow up.  Discussed the use of AI scribe software for clinical note transcription with the patient, who gave verbal consent to proceed.  History of Present Illness Ryan [name] is a 75 year old male who presents for a 90-day follow-up after pacemaker implantation. He is accompanied by an unnamed individual.  He is here for a 90-day follow-up after pacemaker implantation. No pain or soreness is reported at the site of the device, although he describes a sensation of a 'button' due to his thinness.  He has a history of electrical conduction issues in the heart, which necessitated the pacemaker implantation.  He feels 'fine' with no shortness of breath or other significant symptoms. His activity level is limited, primarily involving moving around the house, such as going to the bathroom, influenced by his use of Lasix . He does not engage in regular exercise.  He is on inhalers for breathing issues and sees another doctor for this condition. No recent problems with breathing are reported.   Review of systems complete and found to be negative unless listed in HPI.   EP Information / Studies Reviewed:    EKG is not ordered today. EKG from 08/11/24 reviewed which showed AS-AP / VP      Echo 08/20/24:   1. Limited Echo for TAVR evaluation.   2. Limited views for LVEF evaluation. Overall, left ventricle appears to  have mildly reduced function. Endocardium is not well visualized. Consider   repeating Echo with Definity . Left ventricular diastolic function could  not be evaluated.   3. Right ventricular systolic function is severely reduced. The right  ventricular size is severely enlarged. Tricuspid regurgitation signal is  inadequate for assessing PA pressure.   4. The mitral valve is abnormal. At least moderate mitral stenosis based  on leaflet visualization, cannot rule out severe mitral stenosis. Severe  mitral annular calcification.   5. The aortic valve has been repaired/replaced. Aortic valve  regurgitation is trivial. No aortic stenosis is present. There is a 29 mm  Sapien prosthetic (TAVR) valve present in the aortic position. Aortic  valve mean gradient measures 5.0 mmHg.    Physical Exam:   VS:  There were no vitals taken for this visit.   Wt Readings from Last 3 Encounters:  10/07/24 198 lb 9.6 oz (90.1 kg)  09/30/24 192 lb (87.1 kg)  08/22/24 202 lb 9.6 oz (91.9 kg)     GEN: Well nourished, well developed in no acute distress NECK: No JVD CARDIAC: Normal rate, regular rhythm.  Healed left chest pacer pocket. RESPIRATORY:  Clear to auscultation without rales, wheezing or rhonchi  ABDOMEN: Soft, non-distended EXTREMITIES:  No edema; No deformity   ASSESSMENT AND PLAN:   Assessment and Plan Assessment & Plan Complete heart block with cardiac pacemaker -Interrogation performed today with appropriate device function and stable lead parameters. -Continue remote monitoring.  Aortic stenosis, status post TAVR: Appears well compensated on exam today. -Continue follow-up with general cardiology.   Follow up  with EP Team in 12 months  Signed, [name], MD  "

## 2024-10-16 ENCOUNTER — Encounter (HOSPITAL_COMMUNITY)

## 2024-10-18 ENCOUNTER — Other Ambulatory Visit: Payer: Self-pay | Admitting: Internal Medicine

## 2024-10-18 DIAGNOSIS — R0609 Other forms of dyspnea: Secondary | ICD-10-CM

## 2024-10-21 ENCOUNTER — Ambulatory Visit (HOSPITAL_COMMUNITY)

## 2024-10-23 ENCOUNTER — Encounter (HOSPITAL_COMMUNITY)

## 2024-10-28 ENCOUNTER — Ambulatory Visit (HOSPITAL_COMMUNITY)

## 2024-10-30 ENCOUNTER — Encounter (HOSPITAL_COMMUNITY)

## 2024-10-31 LAB — CUP PACEART INCLINIC DEVICE CHECK
Battery Remaining Longevity: 121 mo
Battery Voltage: 2.99 V
Brady Statistic RA Percent Paced: 31 %
Brady Statistic RV Percent Paced: 74 %
Date Time Interrogation Session: 20260106161820
Implantable Lead Connection Status: 753985
Implantable Lead Connection Status: 753985
Implantable Lead Implant Date: 20251003
Implantable Lead Implant Date: 20251003
Implantable Lead Location: 753859
Implantable Lead Location: 753860
Implantable Pulse Generator Implant Date: 20251003
Lead Channel Impedance Value: 412.5 Ohm
Lead Channel Impedance Value: 475 Ohm
Lead Channel Pacing Threshold Amplitude: 0.625 V
Lead Channel Pacing Threshold Amplitude: 0.75 V
Lead Channel Pacing Threshold Pulse Width: 0.5 ms
Lead Channel Pacing Threshold Pulse Width: 0.5 ms
Lead Channel Sensing Intrinsic Amplitude: 2.6 mV
Lead Channel Sensing Intrinsic Amplitude: 7.9 mV
Lead Channel Setting Pacing Amplitude: 0.875
Lead Channel Setting Pacing Amplitude: 2 V
Lead Channel Setting Pacing Pulse Width: 0.5 ms
Lead Channel Setting Sensing Sensitivity: 2 mV
Pulse Gen Model: 2272
Pulse Gen Serial Number: 8302328

## 2024-11-02 ENCOUNTER — Ambulatory Visit: Payer: Self-pay | Admitting: Cardiology

## 2024-11-03 ENCOUNTER — Ambulatory Visit

## 2024-11-03 ENCOUNTER — Ambulatory Visit: Admitting: Physician Assistant

## 2024-11-04 ENCOUNTER — Ambulatory Visit (HOSPITAL_COMMUNITY)

## 2024-11-06 ENCOUNTER — Encounter (HOSPITAL_COMMUNITY)

## 2024-11-11 ENCOUNTER — Encounter (HOSPITAL_COMMUNITY)

## 2024-11-13 ENCOUNTER — Ambulatory Visit (HOSPITAL_COMMUNITY)

## 2024-11-14 ENCOUNTER — Other Ambulatory Visit: Payer: Self-pay | Admitting: Internal Medicine

## 2024-11-14 DIAGNOSIS — R0609 Other forms of dyspnea: Secondary | ICD-10-CM

## 2024-11-18 ENCOUNTER — Ambulatory Visit (HOSPITAL_COMMUNITY)

## 2024-11-20 ENCOUNTER — Ambulatory Visit (HOSPITAL_COMMUNITY)

## 2024-11-25 ENCOUNTER — Encounter (HOSPITAL_COMMUNITY)

## 2024-11-27 ENCOUNTER — Encounter (HOSPITAL_COMMUNITY)

## 2024-12-02 ENCOUNTER — Encounter (HOSPITAL_COMMUNITY)

## 2024-12-03 ENCOUNTER — Ambulatory Visit

## 2024-12-04 ENCOUNTER — Encounter (HOSPITAL_COMMUNITY)

## 2024-12-09 ENCOUNTER — Encounter (HOSPITAL_COMMUNITY)

## 2025-01-01 ENCOUNTER — Ambulatory Visit: Admitting: Internal Medicine

## 2025-02-10 ENCOUNTER — Other Ambulatory Visit (HOSPITAL_COMMUNITY)

## 2025-02-10 ENCOUNTER — Other Ambulatory Visit

## 2025-02-10 ENCOUNTER — Encounter (HOSPITAL_COMMUNITY)

## 2025-02-17 ENCOUNTER — Ambulatory Visit: Admitting: Physician Assistant

## 2025-03-04 ENCOUNTER — Ambulatory Visit

## 2025-06-03 ENCOUNTER — Ambulatory Visit

## 2025-07-16 ENCOUNTER — Ambulatory Visit (HOSPITAL_COMMUNITY)

## 2025-07-16 ENCOUNTER — Ambulatory Visit: Admitting: Physician Assistant

## 2025-09-02 ENCOUNTER — Ambulatory Visit
